# Patient Record
Sex: Female | Born: 1950 | Race: Black or African American | Hispanic: No | Marital: Single | Smoking: Never smoker
Health system: Southern US, Community
[De-identification: ages and names within clinical notes are randomized; demographics above are authoritative.]

## PROBLEM LIST (undated history)

## (undated) DIAGNOSIS — E66813 Obesity, class 3: Secondary | ICD-10-CM

## (undated) DIAGNOSIS — F319 Bipolar disorder, unspecified: Secondary | ICD-10-CM

## (undated) DIAGNOSIS — L0390 Cellulitis, unspecified: Secondary | ICD-10-CM

## (undated) DIAGNOSIS — G4733 Obstructive sleep apnea (adult) (pediatric): Secondary | ICD-10-CM

## (undated) DIAGNOSIS — I890 Lymphedema, not elsewhere classified: Secondary | ICD-10-CM

## (undated) DIAGNOSIS — R42 Dizziness and giddiness: Secondary | ICD-10-CM

## (undated) DIAGNOSIS — K219 Gastro-esophageal reflux disease without esophagitis: Secondary | ICD-10-CM

## (undated) DIAGNOSIS — J449 Chronic obstructive pulmonary disease, unspecified: Secondary | ICD-10-CM

## (undated) DIAGNOSIS — R0602 Shortness of breath: Secondary | ICD-10-CM

## (undated) DIAGNOSIS — I219 Acute myocardial infarction, unspecified: Secondary | ICD-10-CM

## (undated) DIAGNOSIS — J9610 Chronic respiratory failure, unspecified whether with hypoxia or hypercapnia: Secondary | ICD-10-CM

## (undated) DIAGNOSIS — Z9989 Dependence on other enabling machines and devices: Secondary | ICD-10-CM

## (undated) DIAGNOSIS — I10 Essential (primary) hypertension: Secondary | ICD-10-CM

## (undated) DIAGNOSIS — I2510 Atherosclerotic heart disease of native coronary artery without angina pectoris: Secondary | ICD-10-CM

## (undated) DIAGNOSIS — I739 Peripheral vascular disease, unspecified: Secondary | ICD-10-CM

## (undated) HISTORY — PX: APPENDECTOMY: SHX54

## (undated) HISTORY — PX: CHOLECYSTECTOMY: SHX55

## (undated) HISTORY — PX: ABDOMINAL HYSTERECTOMY: SHX81

## (undated) HISTORY — PX: TRACHEOSTOMY: SUR1362

## (undated) HISTORY — PX: TUBAL LIGATION: SHX77

---

## 1999-10-05 ENCOUNTER — Other Ambulatory Visit: Admission: RE | Admit: 1999-10-05 | Discharge: 1999-10-05 | Payer: Self-pay | Admitting: Family Medicine

## 1999-12-20 ENCOUNTER — Ambulatory Visit (HOSPITAL_COMMUNITY): Admission: RE | Admit: 1999-12-20 | Discharge: 1999-12-20 | Payer: Self-pay | Admitting: Gastroenterology

## 2001-10-12 ENCOUNTER — Emergency Department (HOSPITAL_COMMUNITY): Admission: EM | Admit: 2001-10-12 | Discharge: 2001-10-12 | Payer: Self-pay | Admitting: Emergency Medicine

## 2001-12-07 ENCOUNTER — Emergency Department (HOSPITAL_COMMUNITY): Admission: EM | Admit: 2001-12-07 | Discharge: 2001-12-07 | Payer: Self-pay | Admitting: Emergency Medicine

## 2001-12-27 ENCOUNTER — Emergency Department (HOSPITAL_COMMUNITY): Admission: EM | Admit: 2001-12-27 | Discharge: 2001-12-27 | Payer: Self-pay | Admitting: Emergency Medicine

## 2002-02-16 ENCOUNTER — Emergency Department (HOSPITAL_COMMUNITY): Admission: EM | Admit: 2002-02-16 | Discharge: 2002-02-16 | Payer: Self-pay | Admitting: Emergency Medicine

## 2002-03-12 ENCOUNTER — Encounter: Payer: Self-pay | Admitting: Internal Medicine

## 2002-03-12 ENCOUNTER — Encounter (HOSPITAL_COMMUNITY): Admission: RE | Admit: 2002-03-12 | Discharge: 2002-04-11 | Payer: Self-pay | Admitting: Internal Medicine

## 2002-04-10 ENCOUNTER — Ambulatory Visit (HOSPITAL_COMMUNITY): Admission: RE | Admit: 2002-04-10 | Discharge: 2002-04-10 | Payer: Self-pay | Admitting: General Surgery

## 2002-09-06 ENCOUNTER — Emergency Department (HOSPITAL_COMMUNITY): Admission: EM | Admit: 2002-09-06 | Discharge: 2002-09-06 | Payer: Self-pay | Admitting: Emergency Medicine

## 2002-09-06 ENCOUNTER — Encounter: Payer: Self-pay | Admitting: Emergency Medicine

## 2002-09-20 ENCOUNTER — Encounter: Payer: Self-pay | Admitting: Emergency Medicine

## 2002-09-20 ENCOUNTER — Emergency Department (HOSPITAL_COMMUNITY): Admission: EM | Admit: 2002-09-20 | Discharge: 2002-09-20 | Payer: Self-pay | Admitting: Emergency Medicine

## 2002-11-02 ENCOUNTER — Encounter: Payer: Self-pay | Admitting: Emergency Medicine

## 2002-11-02 ENCOUNTER — Emergency Department (HOSPITAL_COMMUNITY): Admission: EM | Admit: 2002-11-02 | Discharge: 2002-11-02 | Payer: Self-pay | Admitting: Emergency Medicine

## 2002-12-16 ENCOUNTER — Emergency Department (HOSPITAL_COMMUNITY): Admission: EM | Admit: 2002-12-16 | Discharge: 2002-12-16 | Payer: Self-pay | Admitting: Emergency Medicine

## 2003-02-06 ENCOUNTER — Ambulatory Visit (HOSPITAL_COMMUNITY): Admission: RE | Admit: 2003-02-06 | Discharge: 2003-02-06 | Payer: Self-pay | Admitting: General Surgery

## 2003-02-06 ENCOUNTER — Encounter: Payer: Self-pay | Admitting: General Surgery

## 2003-02-27 ENCOUNTER — Inpatient Hospital Stay (HOSPITAL_COMMUNITY): Admission: AD | Admit: 2003-02-27 | Discharge: 2003-03-11 | Payer: Self-pay | Admitting: General Surgery

## 2003-02-27 ENCOUNTER — Encounter: Payer: Self-pay | Admitting: General Surgery

## 2003-03-30 ENCOUNTER — Emergency Department (HOSPITAL_COMMUNITY): Admission: EM | Admit: 2003-03-30 | Discharge: 2003-03-30 | Payer: Self-pay | Admitting: Emergency Medicine

## 2003-05-31 ENCOUNTER — Emergency Department (HOSPITAL_COMMUNITY): Admission: EM | Admit: 2003-05-31 | Discharge: 2003-05-31 | Payer: Self-pay | Admitting: Emergency Medicine

## 2003-08-27 ENCOUNTER — Inpatient Hospital Stay (HOSPITAL_COMMUNITY): Admission: EM | Admit: 2003-08-27 | Discharge: 2003-09-08 | Payer: Self-pay | Admitting: Emergency Medicine

## 2003-09-11 ENCOUNTER — Emergency Department (HOSPITAL_COMMUNITY): Admission: EM | Admit: 2003-09-11 | Discharge: 2003-09-11 | Payer: Self-pay | Admitting: Emergency Medicine

## 2003-09-13 ENCOUNTER — Inpatient Hospital Stay (HOSPITAL_COMMUNITY): Admission: EM | Admit: 2003-09-13 | Discharge: 2003-10-01 | Payer: Self-pay | Admitting: Emergency Medicine

## 2003-10-23 ENCOUNTER — Encounter: Admission: RE | Admit: 2003-10-23 | Discharge: 2003-10-23 | Payer: Self-pay | Admitting: Unknown Physician Specialty

## 2003-10-26 ENCOUNTER — Inpatient Hospital Stay (HOSPITAL_COMMUNITY): Admission: EM | Admit: 2003-10-26 | Discharge: 2003-10-28 | Payer: Self-pay | Admitting: Emergency Medicine

## 2003-11-10 ENCOUNTER — Emergency Department (HOSPITAL_COMMUNITY): Admission: EM | Admit: 2003-11-10 | Discharge: 2003-11-11 | Payer: Self-pay | Admitting: Emergency Medicine

## 2003-11-14 ENCOUNTER — Other Ambulatory Visit: Admission: RE | Admit: 2003-11-14 | Discharge: 2003-11-14 | Payer: Self-pay | Admitting: Obstetrics and Gynecology

## 2003-11-14 ENCOUNTER — Ambulatory Visit (HOSPITAL_COMMUNITY): Admission: RE | Admit: 2003-11-14 | Discharge: 2003-11-14 | Payer: Self-pay | Admitting: Unknown Physician Specialty

## 2003-11-17 ENCOUNTER — Inpatient Hospital Stay (HOSPITAL_COMMUNITY): Admission: EM | Admit: 2003-11-17 | Discharge: 2003-11-28 | Payer: Self-pay | Admitting: Emergency Medicine

## 2003-11-26 ENCOUNTER — Encounter (INDEPENDENT_AMBULATORY_CARE_PROVIDER_SITE_OTHER): Payer: Self-pay | Admitting: *Deleted

## 2003-12-11 ENCOUNTER — Inpatient Hospital Stay (HOSPITAL_COMMUNITY): Admission: EM | Admit: 2003-12-11 | Discharge: 2003-12-17 | Payer: Self-pay | Admitting: Emergency Medicine

## 2004-02-18 ENCOUNTER — Inpatient Hospital Stay (HOSPITAL_COMMUNITY): Admission: EM | Admit: 2004-02-18 | Discharge: 2004-02-23 | Payer: Self-pay | Admitting: Emergency Medicine

## 2004-03-25 ENCOUNTER — Emergency Department (HOSPITAL_COMMUNITY): Admission: EM | Admit: 2004-03-25 | Discharge: 2004-03-25 | Payer: Self-pay | Admitting: Emergency Medicine

## 2004-05-10 ENCOUNTER — Emergency Department (HOSPITAL_COMMUNITY): Admission: EM | Admit: 2004-05-10 | Discharge: 2004-05-10 | Payer: Self-pay

## 2004-09-03 IMAGING — CR DG CHEST 1V PORT
1 series · 1 of 1 positions shown · non-contrast
Comparison: none

CLINICAL DATA: Chest pain.
 CHEST PORTABLE ONE VIEW
 Comparison 02/27/03.  
 Port-A-Cath is in place with the tip overlying the superior vena cava.  There is chronic elevation of the right hemidiaphragm.  Heart size and vascularity are within normal limits.  The lungs are clear.  
 IMPRESSION
 No acute abnormality.

[view not recorded]
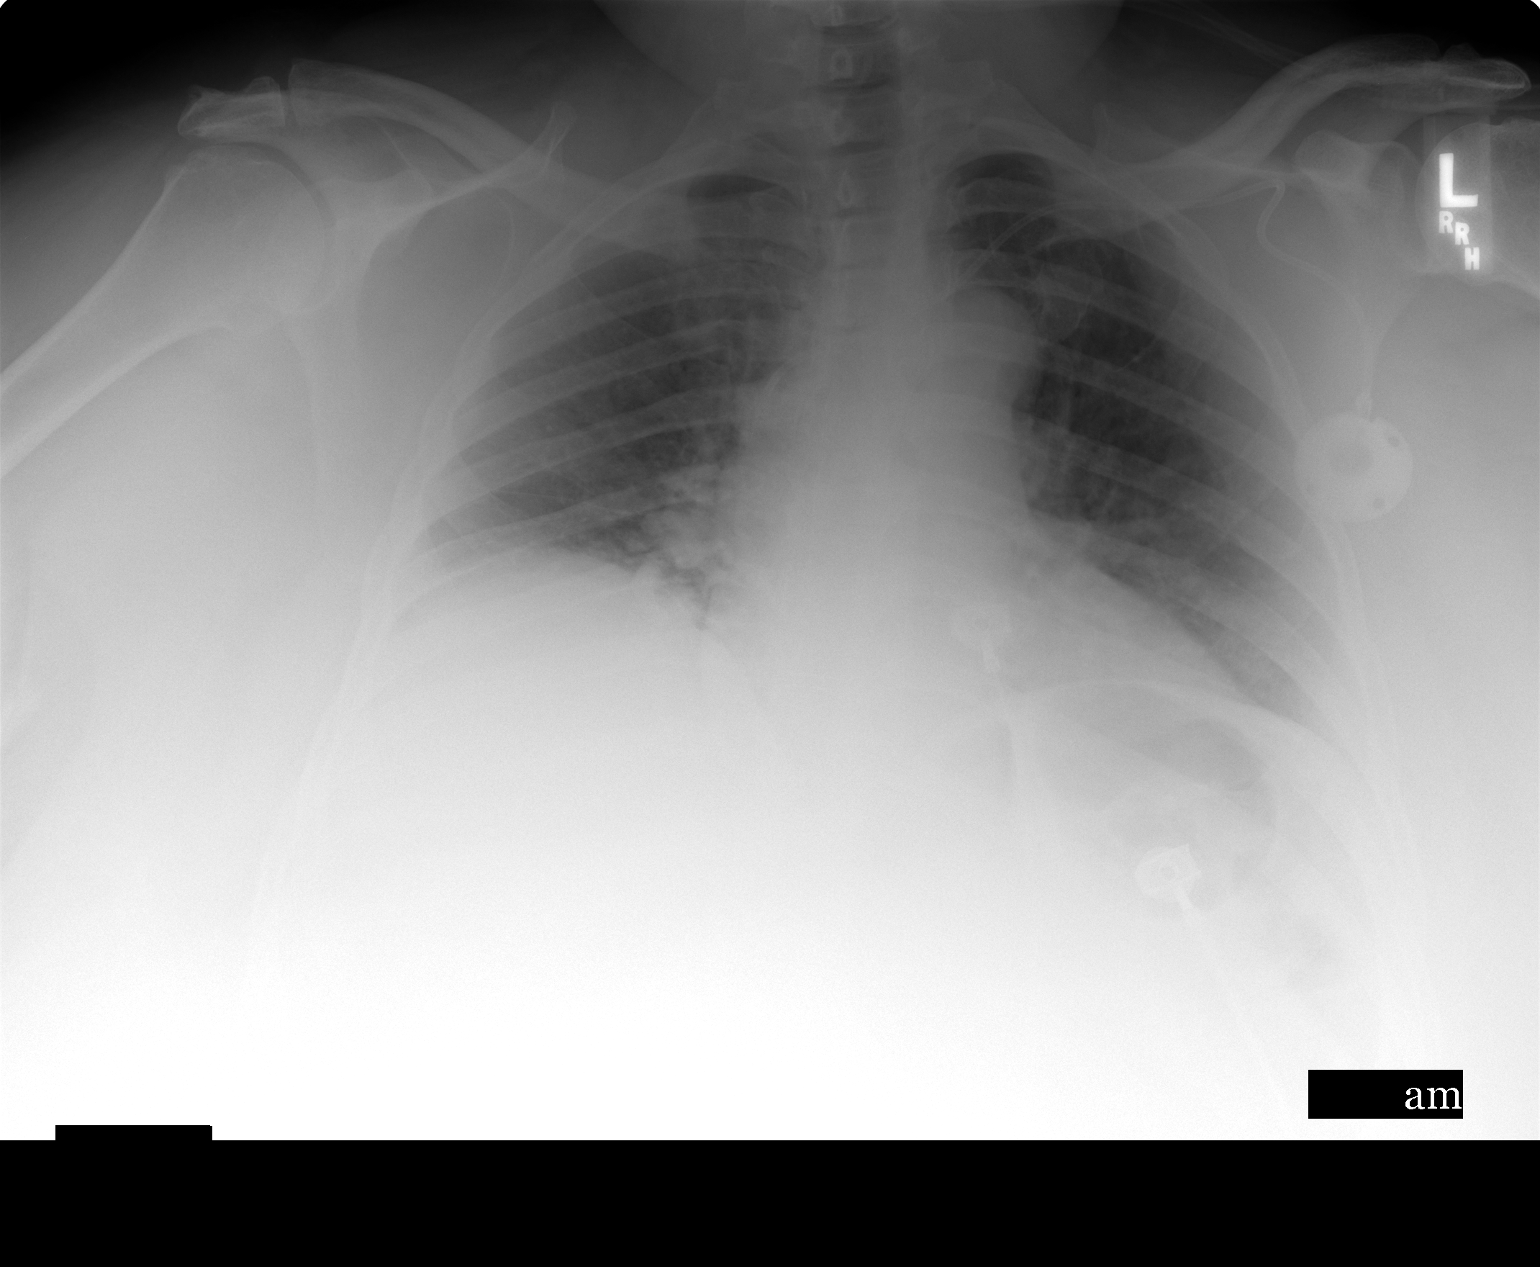

[1 of 1 positions shown; findings below may reference images not displayed]

## 2004-09-05 IMAGING — CR DG CHEST 1V PORT
1 series · 1 of 1 positions shown · non-contrast
Comparison: none

CLINICAL DATA: Port-A-Cath insertion, sepsis.  
 PORTABLE CHEST ONE VIEW
 Portable exam at 9901 hours compared to 08/27/03.  Left subclavian Port-A-Cath replacement, tip, right atrium.  No pneumothorax.  Bibasilar atelectasis.  Cardiomegaly.  Upper lungs clear, though slight vascular congestion is noted.  
 IMPRESSION
 Cardiomegaly with vascular congestion.  Bibasilar atelectasis.  No pneumothorax following Port-A-Cath insertion.

[view not recorded]
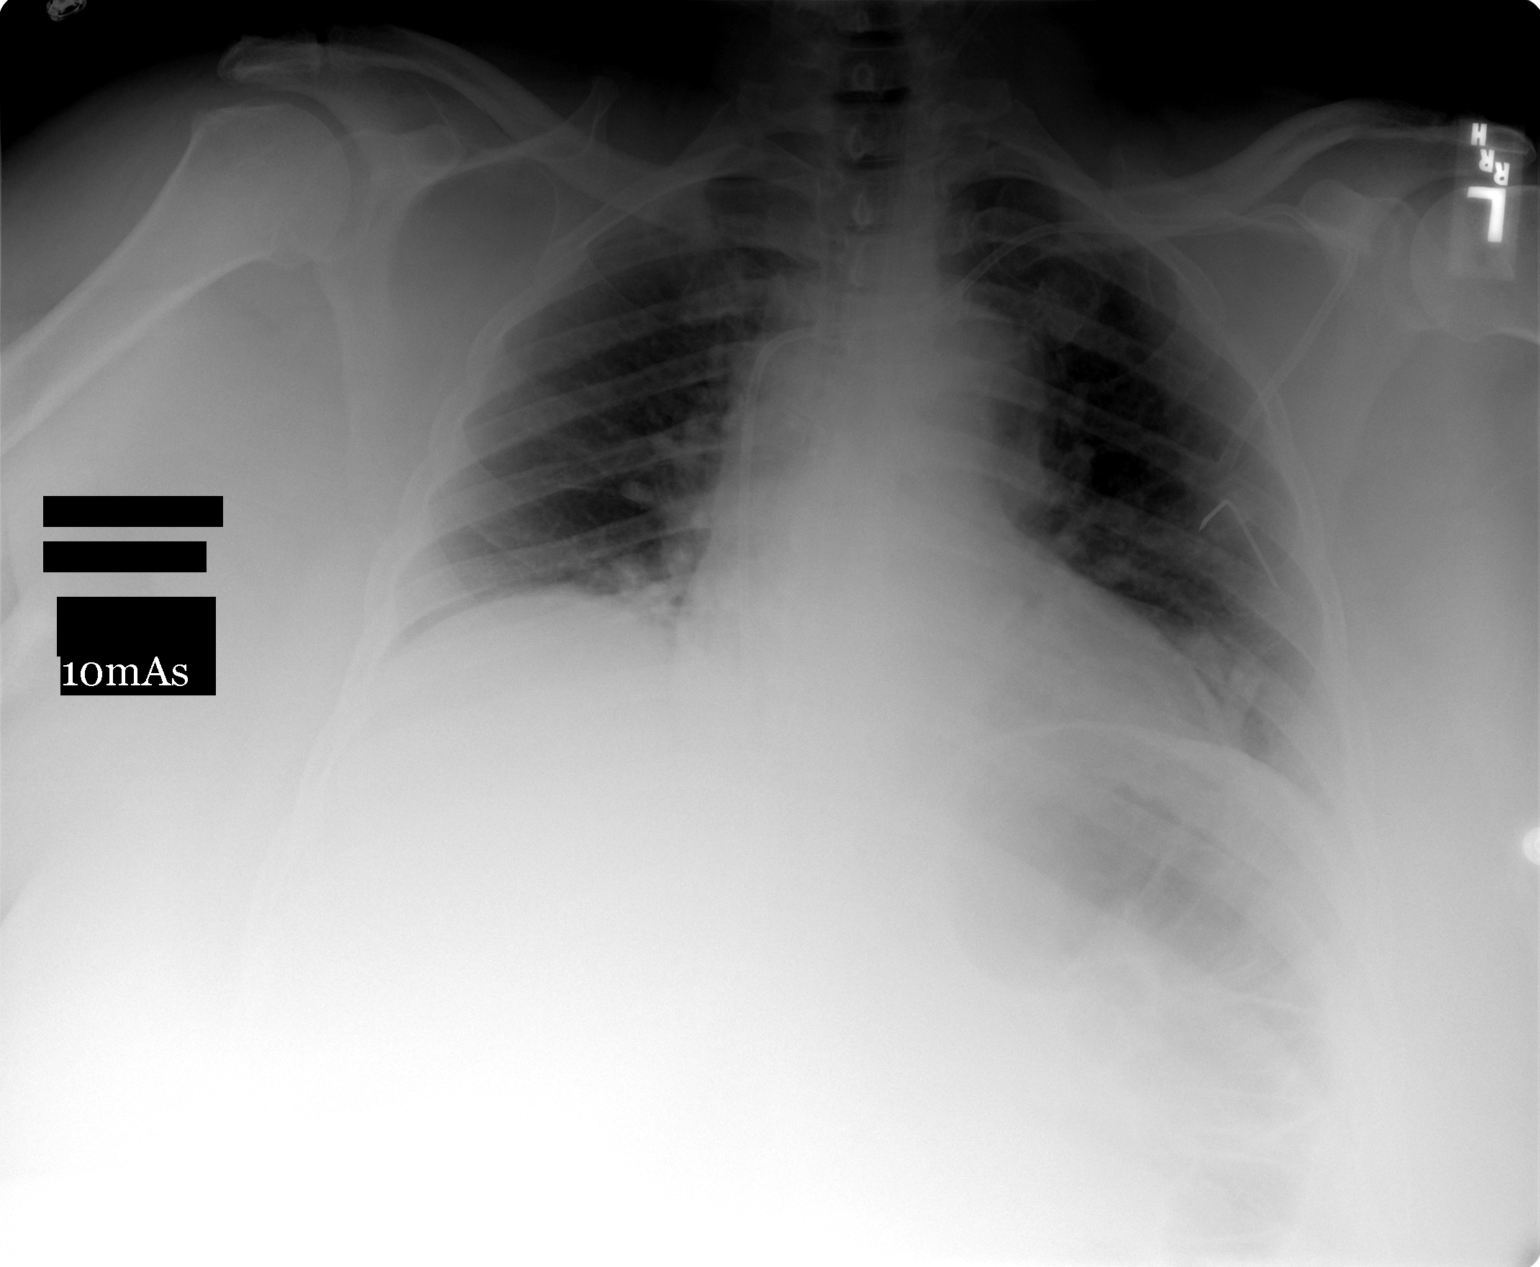

[1 of 1 positions shown; findings below may reference images not displayed]

## 2004-09-06 IMAGING — CR DG TIBIA/FIBULA PORT 2V*L*
2 series · 2 of 2 positions shown · non-contrast
Comparison: none

CLINICAL DATA: Lower extremity swelling; question of soft tissue gas
 LEFT TIBIA AND FIBULA
 Two views are performed of the left tibia and fibula, showing significant soft tissue edema.  The contours of the tibia and fibula are somewhat wavy, raising the question of prior trauma.  No acute fracture is seen.  No definite soft tissue gas is identified.  Degenerative changes are seen in the ankle and knee.  
 IMPRESSION
 No definite soft tissue gas or fracture.

[view not recorded (1 of 2)]
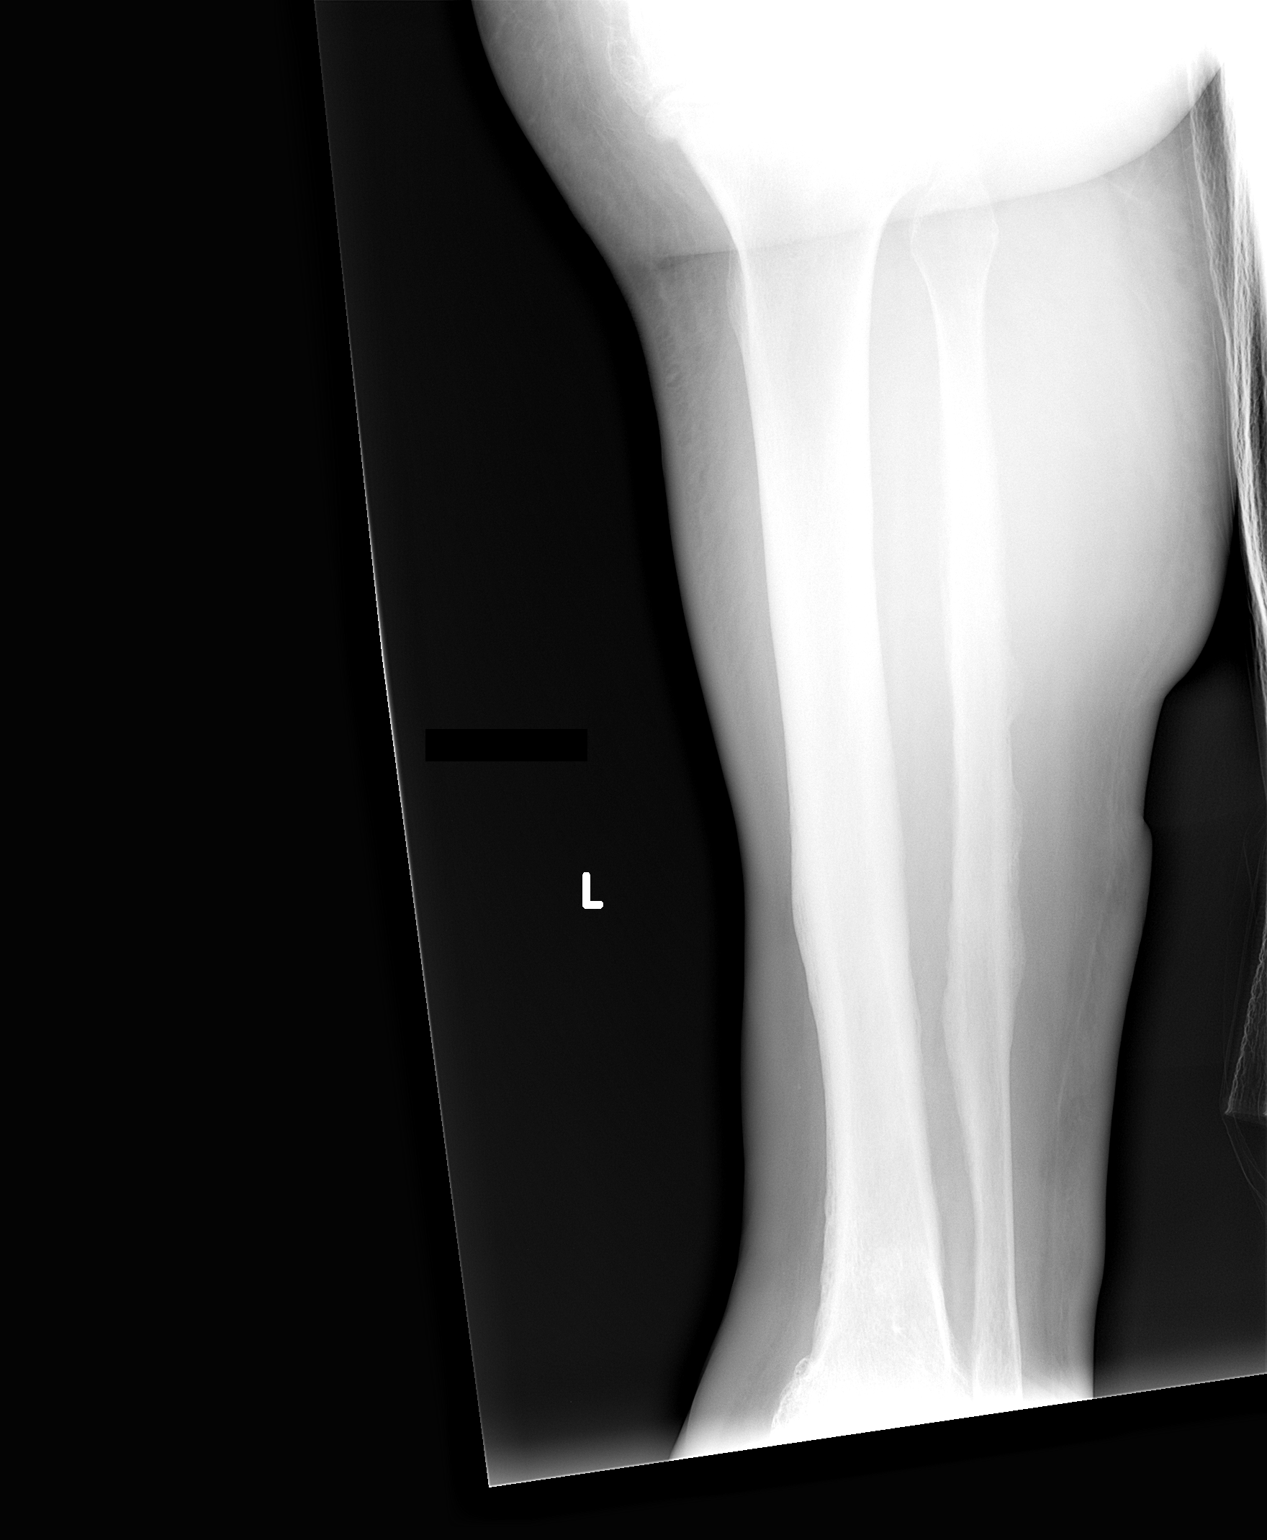

[view not recorded (2 of 2)]
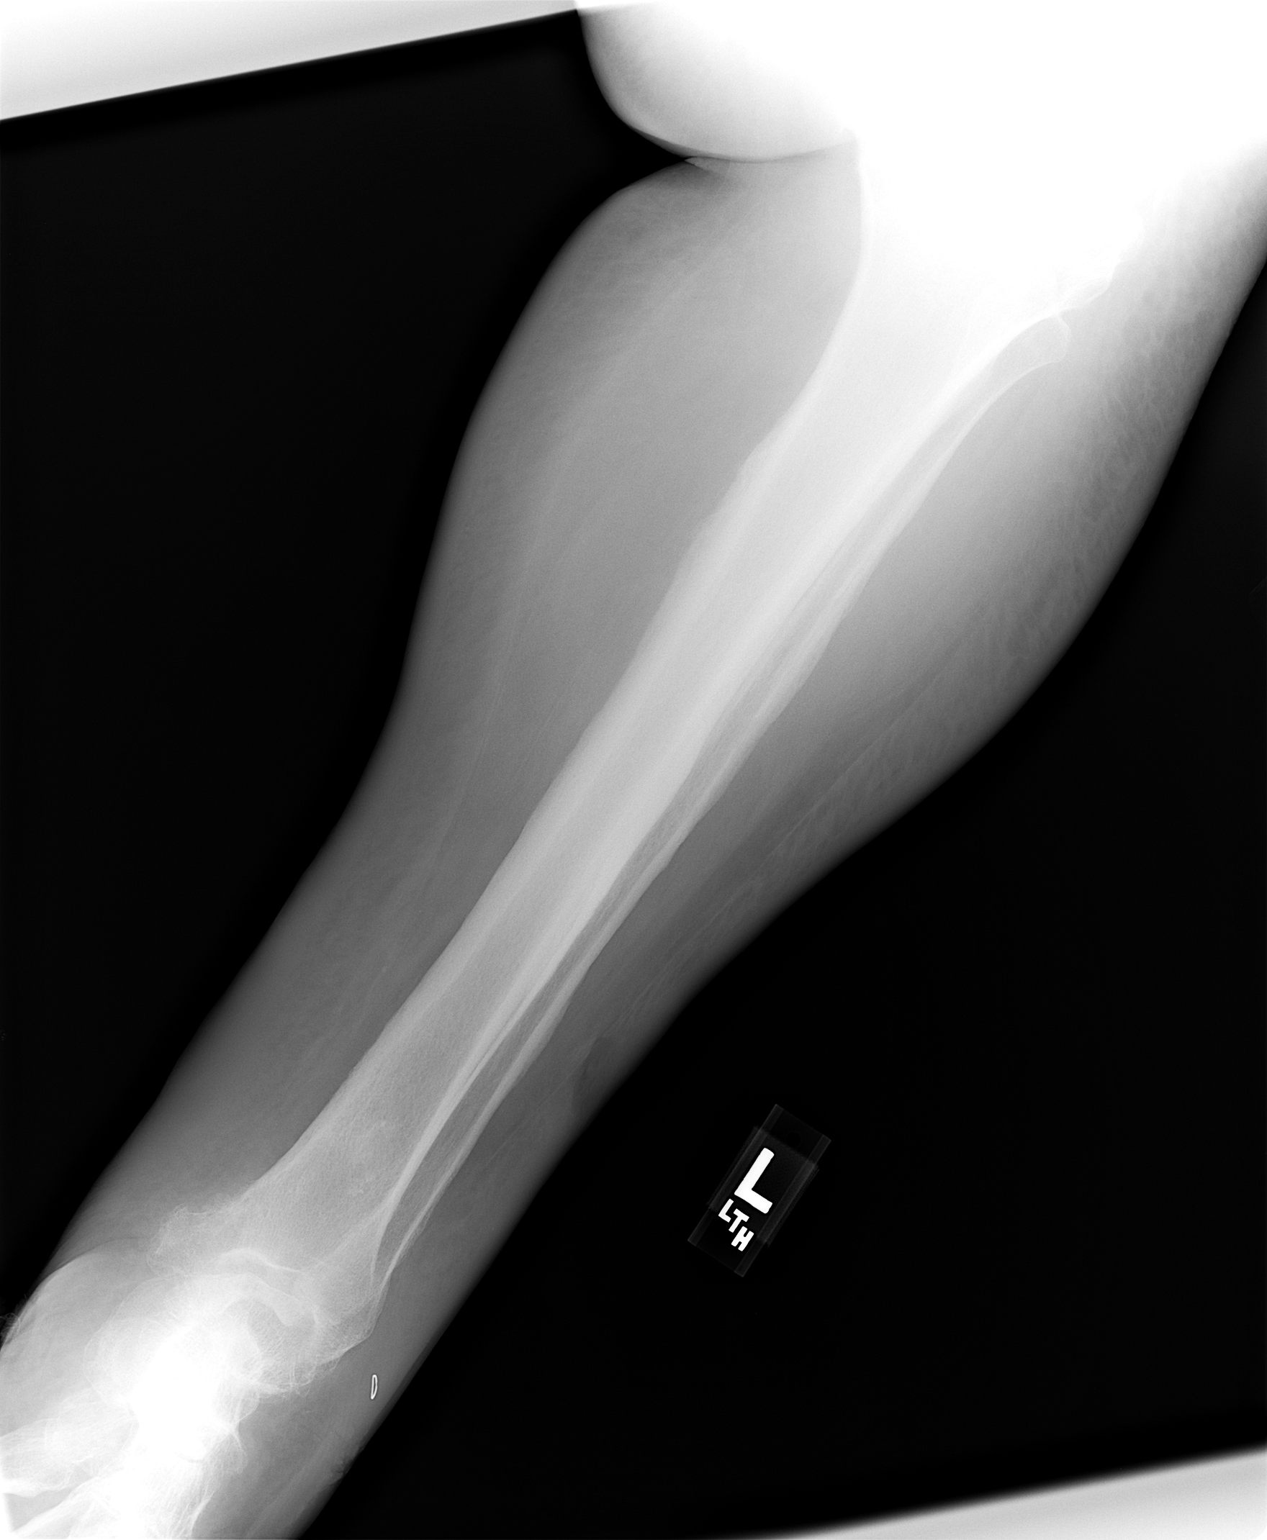

[2 of 2 positions shown; findings below may reference images not displayed]

## 2004-09-07 IMAGING — CT CT EXTREM LOW W/O CM*L*
1 series · 12 of 14 positions shown, 15 images · non-contrast
Comparison: none

CLINICAL DATA: Left leg soft tissue swelling and cellulitis.  Evaluate for soft tissue abscess.
CT OF THE LEFT LOWER EXTREMITY WITHOUT CONTRAST
Multidetector helical CT was performed through the mid and lower left leg and ankle in the region of soft tissue swelling.  No intravenous contrast was administered due to lack of suitable intravenous access in this patient.  
Marked edema and skin thickening is seen throughout the superficial subcutaneous tissues of the left leg and ankle.  This is more severe along the medial and anterior aspects of the leg and ankle.  This study is limited by lack of intravenous contrast, but no definite focal low density fluid collection is seen to suggest the presence of a soft tissue abscess.  Chronic periosteal thickening is seen along the tibial and fibular shafts.  However, there is no evidence of acute bone destruction or abnormal lucency to suggest the presence of osteomyelitis.
IMPRESSION
1.  Diffuse edema of subcutaneous tissues and skin thickening throughout the left leg and ankle, consistent with cellulitis.  This study is limited by lack of intravenous contrast, but no definite abscess collection is identified by CT.  MRI would be more sensitive for soft tissue abscess and is recommended for further evaluation if clinically warranted.  
2.  No CT evidence of osteomyelitis.  MRI would also be more sensitive in detection of osteomyelitis.

[Series 6417: — · axial · 0.48mm/px · z∈[+1114,+1138]mm · 12 of 48 slices shown, 15 images]
[im 4/48  soft-tissue]
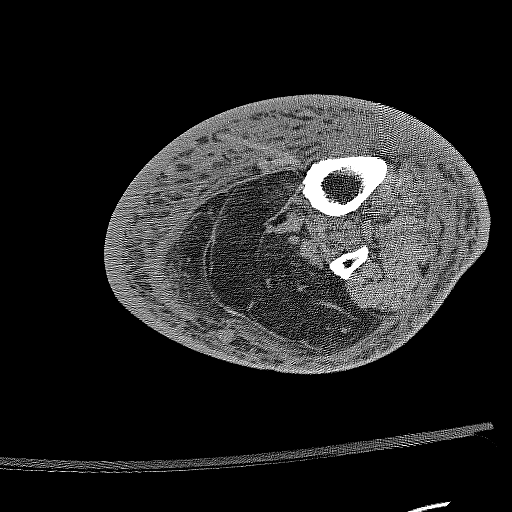
[im 4/48  bone]
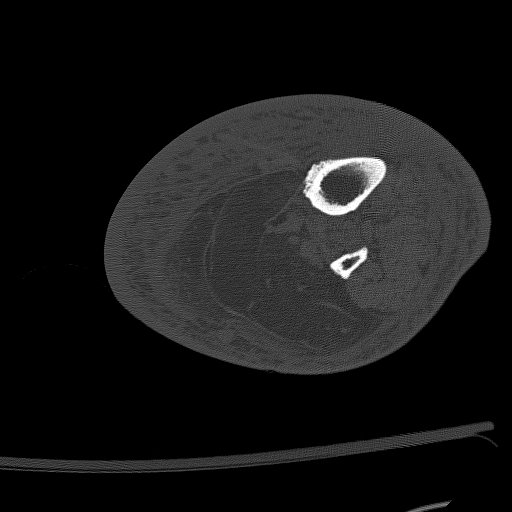
[im 8/48  bone]
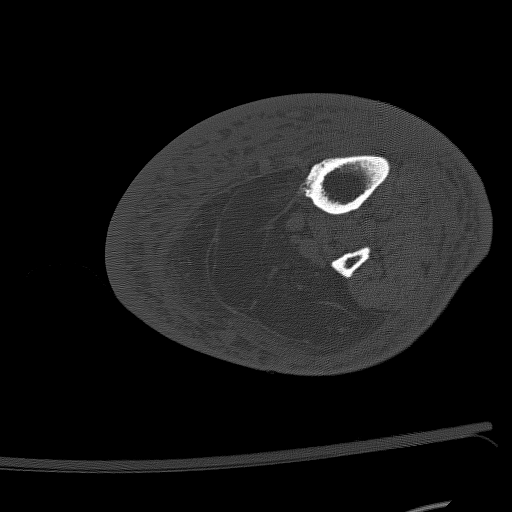
[im 11/48  bone]
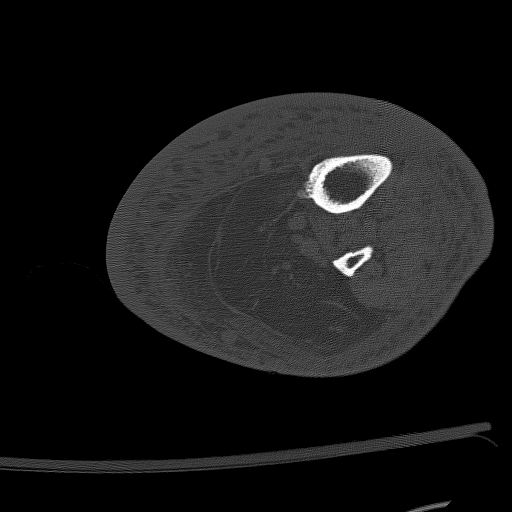
[im 15/48  bone]
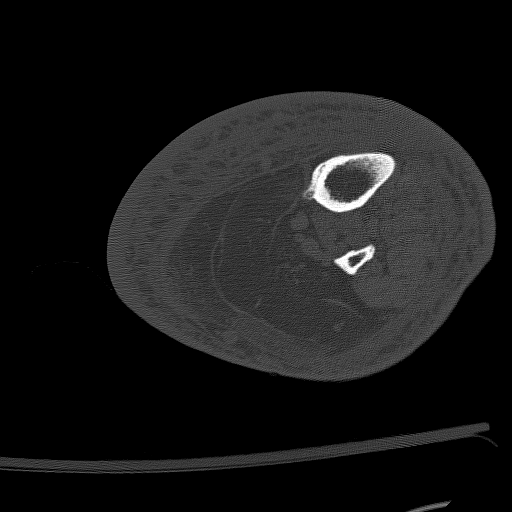
[im 19/48  soft-tissue]
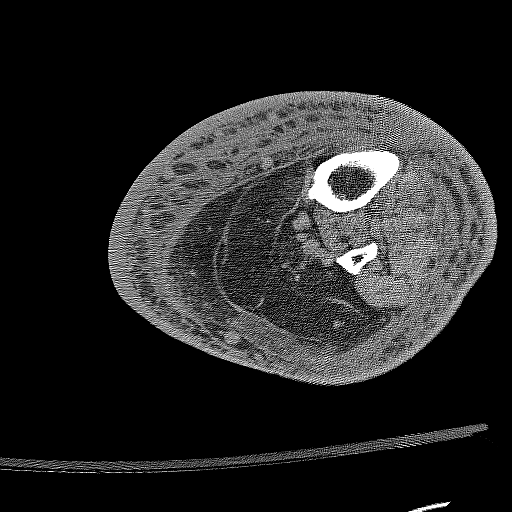
[im 19/48  bone]
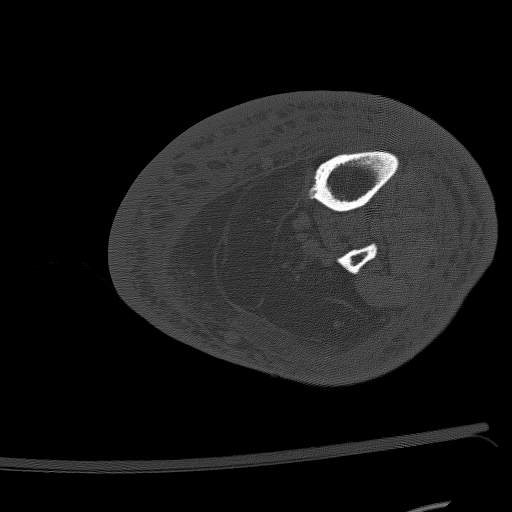
[im 22/48  bone]
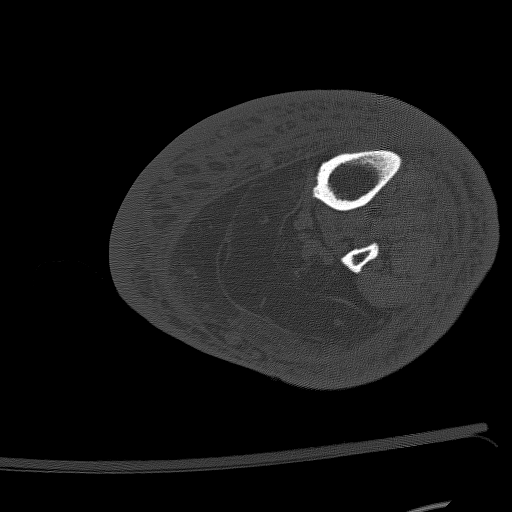
[im 26/48  bone]
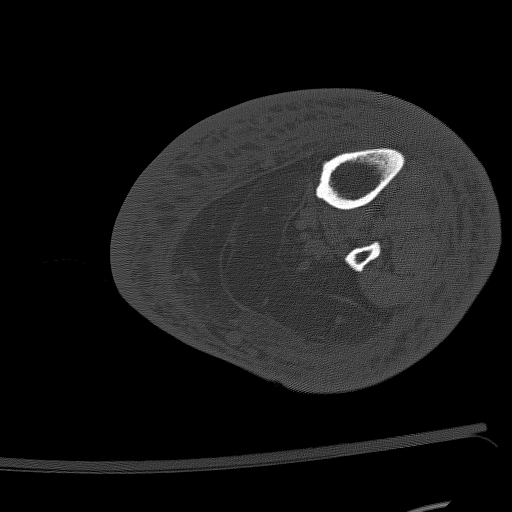
[im 29/48  bone]
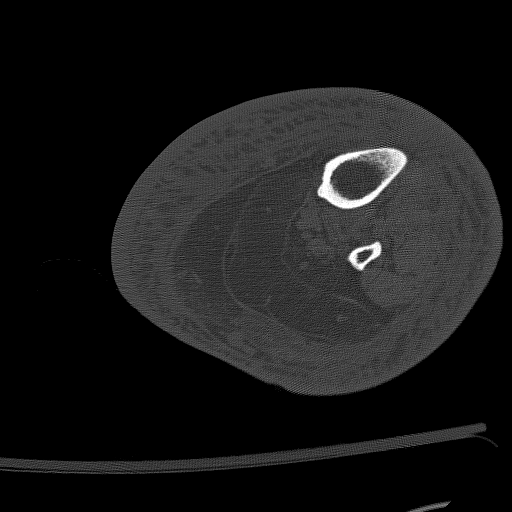
[im 33/48  soft-tissue]
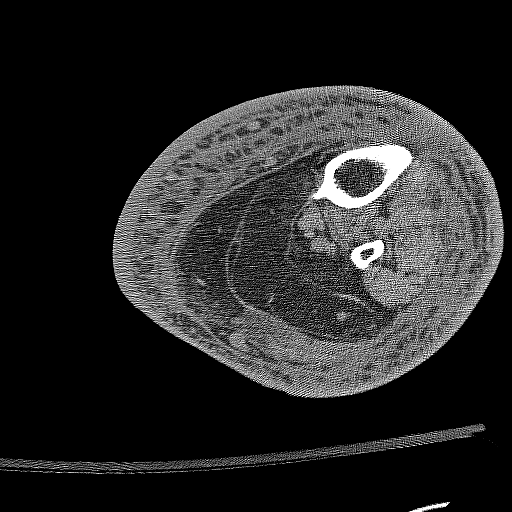
[im 33/48  bone]
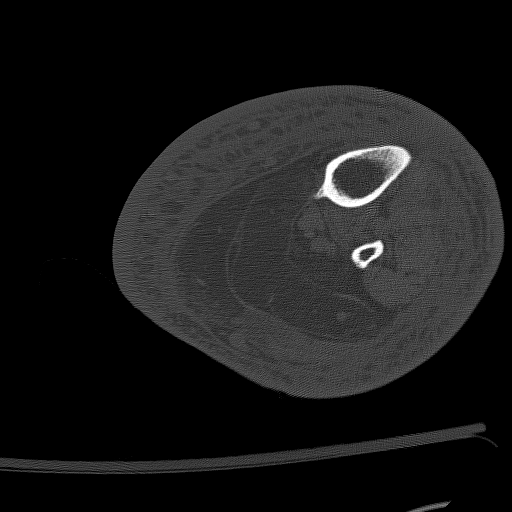
[im 37/48  bone]
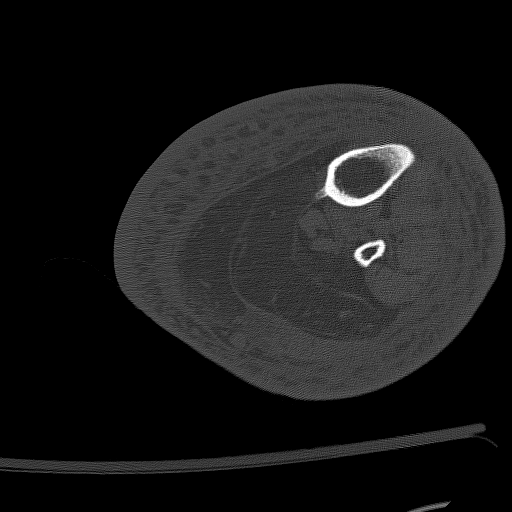
[im 40/48  bone]
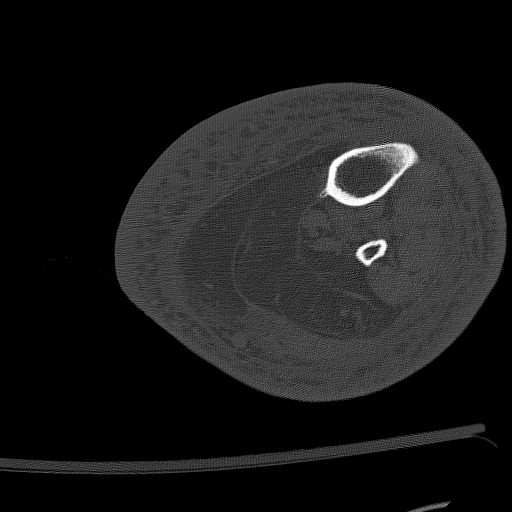
[im 44/48  bone]
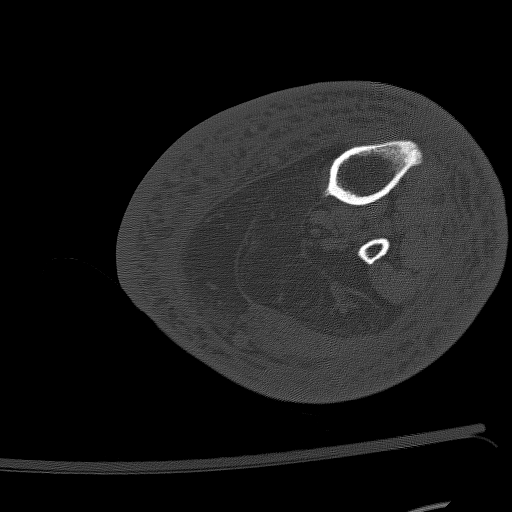

[12 of 14 positions shown; findings below may reference images not displayed]

## 2004-09-12 IMAGING — CR DG TIBIA/FIBULA 2V*L*
2 series · 2 of 2 positions shown · non-contrast
Comparison: none

CLINICAL DATA: Evaluate for new fracture.  
 LEFT FOOT FOUR VIEWS
 Evidence of old fracture with bony overgrowth proximal left fourth metatarsal shaft.  Present exam is somewhat limited in that standard imaging was not able to be performed.  Taking this limitation into account, an acute fracture is not identified.  Severe soft tissue swelling.  No plain film findings to suggest osteomyelitis.  Radiopaque structure along the dorsal aspect of the ankle.  If further evaluation of the foot were clinically desired, MR imaging may be considered.  
 IMPRESSION
 No evidence of acute fracture.  Please see above.
 LEFT TIBIA/FIBULA TWO VIEWS
 Limited exam in that standard views were not able to be obtained.  No acute fracture detected.  Diffuse bony changes appear chronic in origin.  Soft tissue swelling.  Please see recent MR report.  Acute osteomyelitis superimposed on chronic osteomyelitis cannot be excluded.  Enchondroma distal tibial metaphysis suspected.  Small focus of osteomyelitis cannot be absolutely excluded.  
 No acute fracture detected on this limited exam.

[view not recorded (1 of 2)]
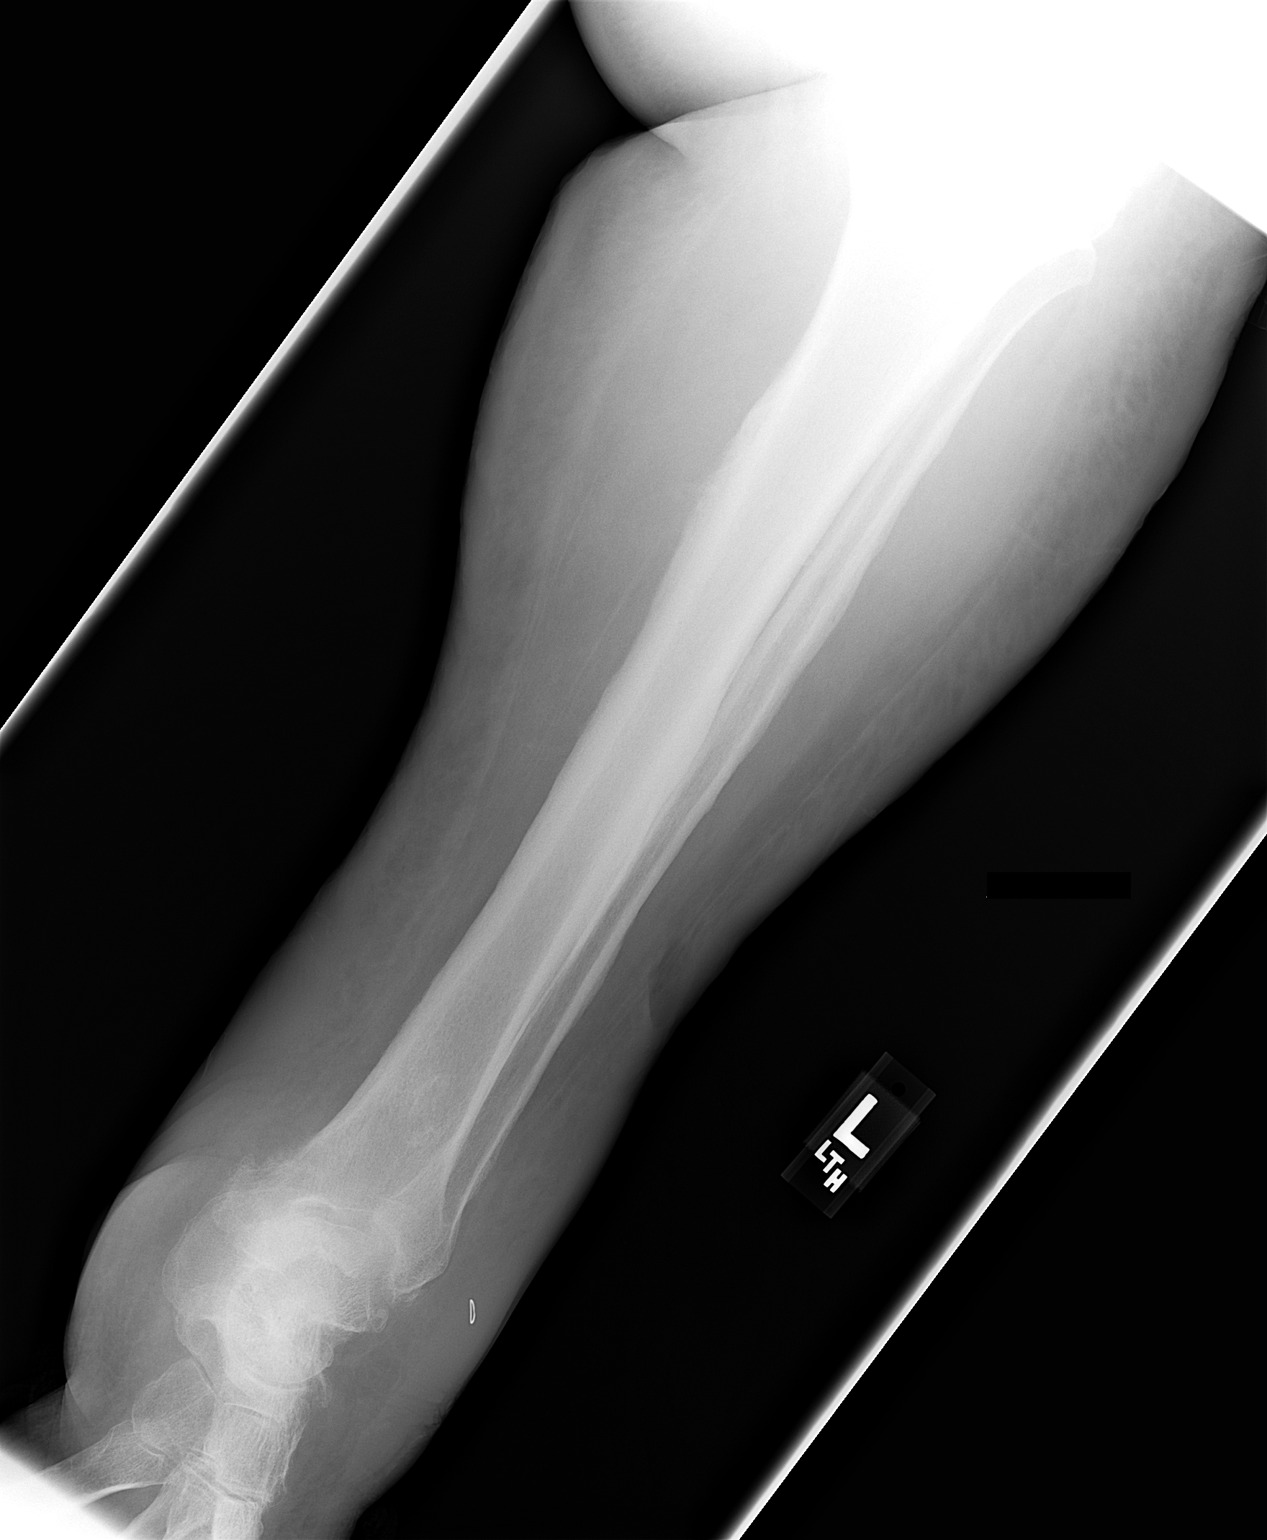

[view not recorded (2 of 2)]
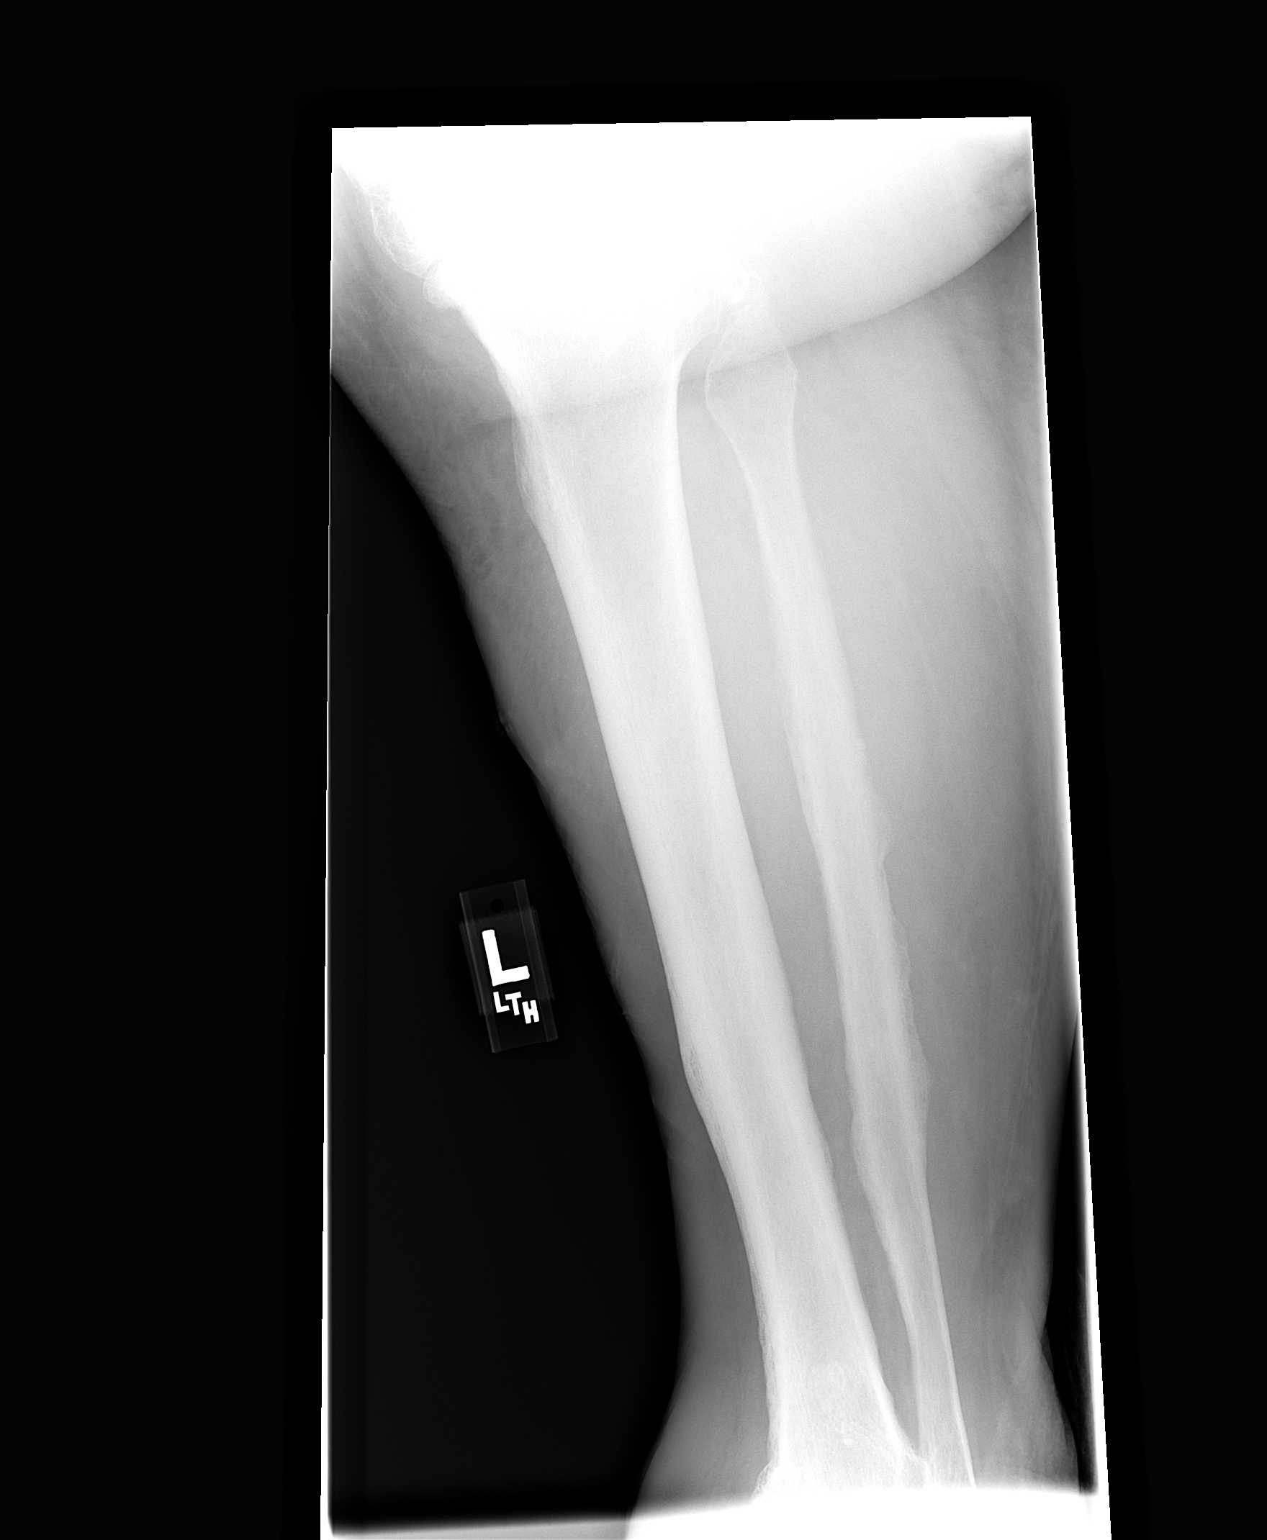

[2 of 2 positions shown; findings below may reference images not displayed]

## 2004-09-12 IMAGING — CR DG FOOT COMPLETE 3+V*L*
3 series · 3 of 3 positions shown · non-contrast
Comparison: none

CLINICAL DATA: Evaluate for new fracture.  
 LEFT FOOT FOUR VIEWS
 Evidence of old fracture with bony overgrowth proximal left fourth metatarsal shaft.  Present exam is somewhat limited in that standard imaging was not able to be performed.  Taking this limitation into account, an acute fracture is not identified.  Severe soft tissue swelling.  No plain film findings to suggest osteomyelitis.  Radiopaque structure along the dorsal aspect of the ankle.  If further evaluation of the foot were clinically desired, MR imaging may be considered.  
 IMPRESSION
 No evidence of acute fracture.  Please see above.
 LEFT TIBIA/FIBULA TWO VIEWS
 Limited exam in that standard views were not able to be obtained.  No acute fracture detected.  Diffuse bony changes appear chronic in origin.  Soft tissue swelling.  Please see recent MR report.  Acute osteomyelitis superimposed on chronic osteomyelitis cannot be excluded.  Enchondroma distal tibial metaphysis suspected.  Small focus of osteomyelitis cannot be absolutely excluded.  
 No acute fracture detected on this limited exam.

[view not recorded (1 of 3)]
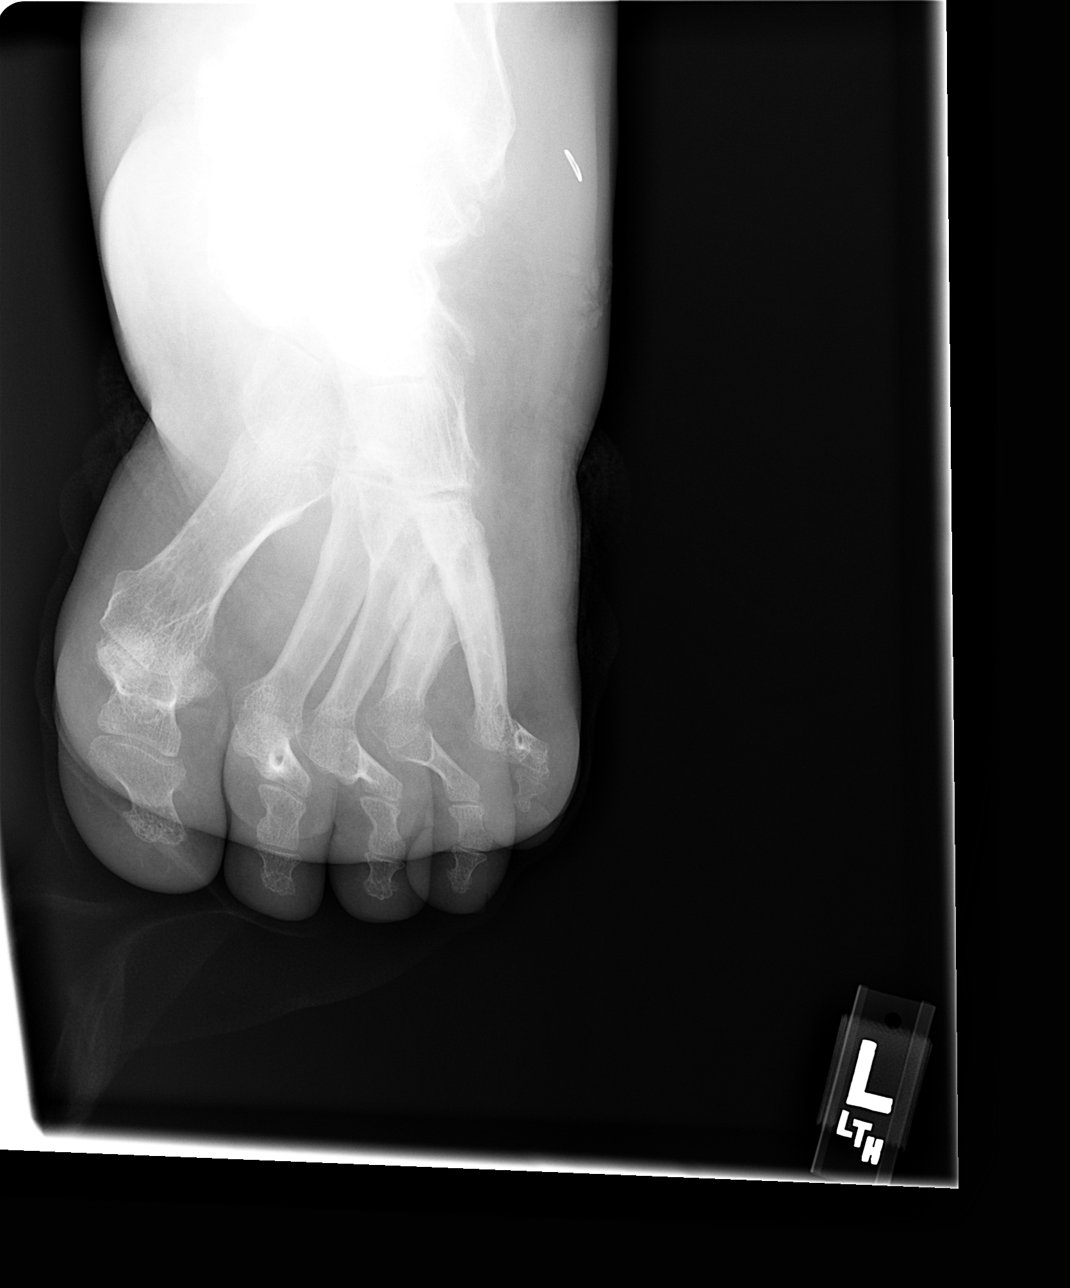

[view not recorded (2 of 3)]
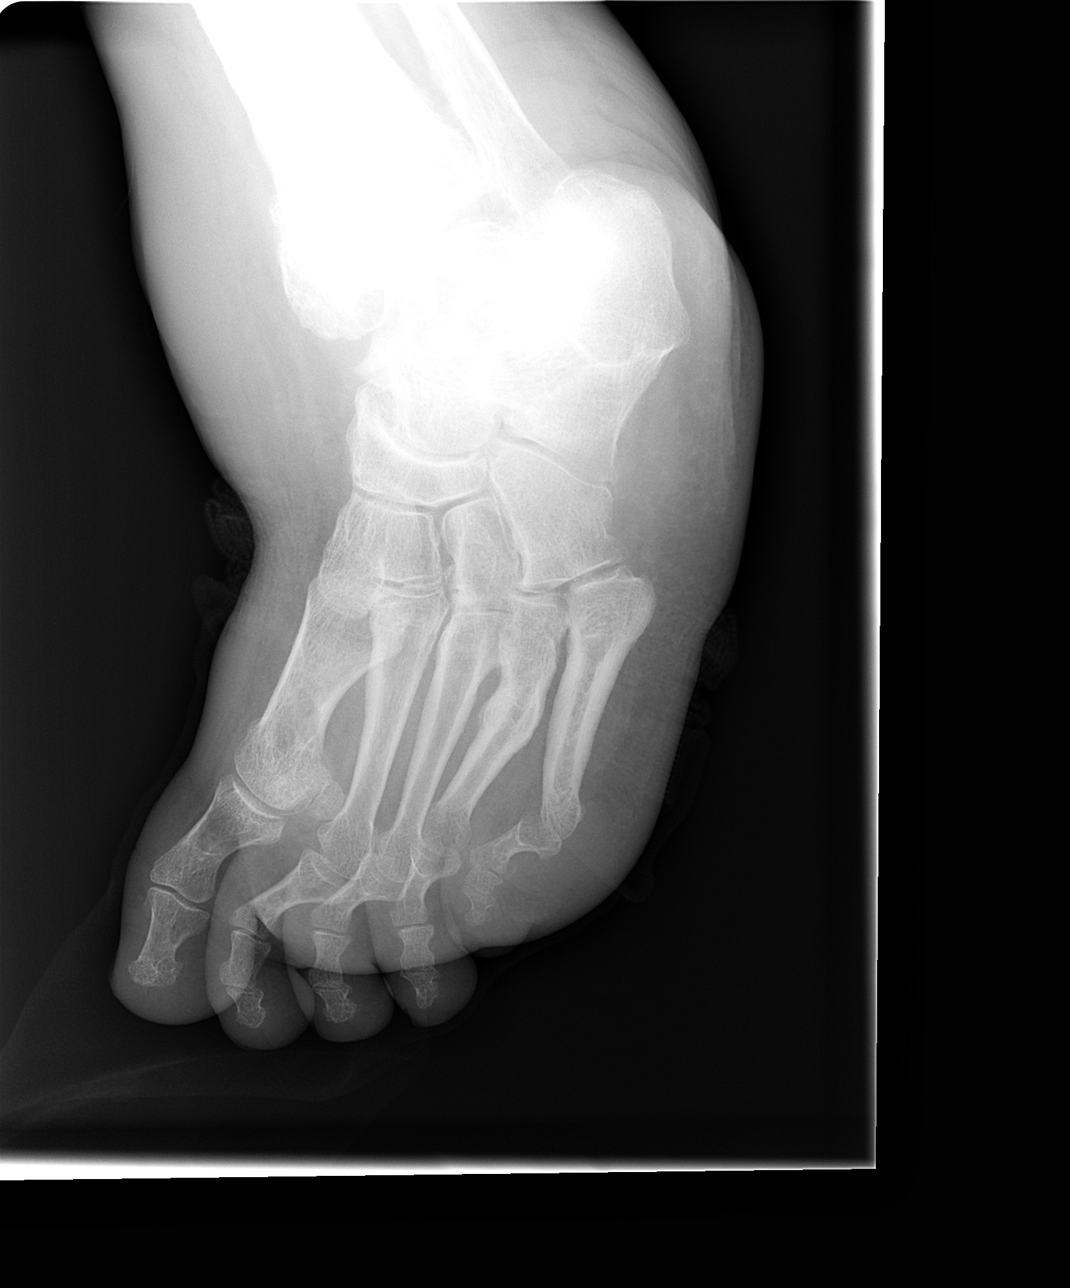

[view not recorded (3 of 3)]
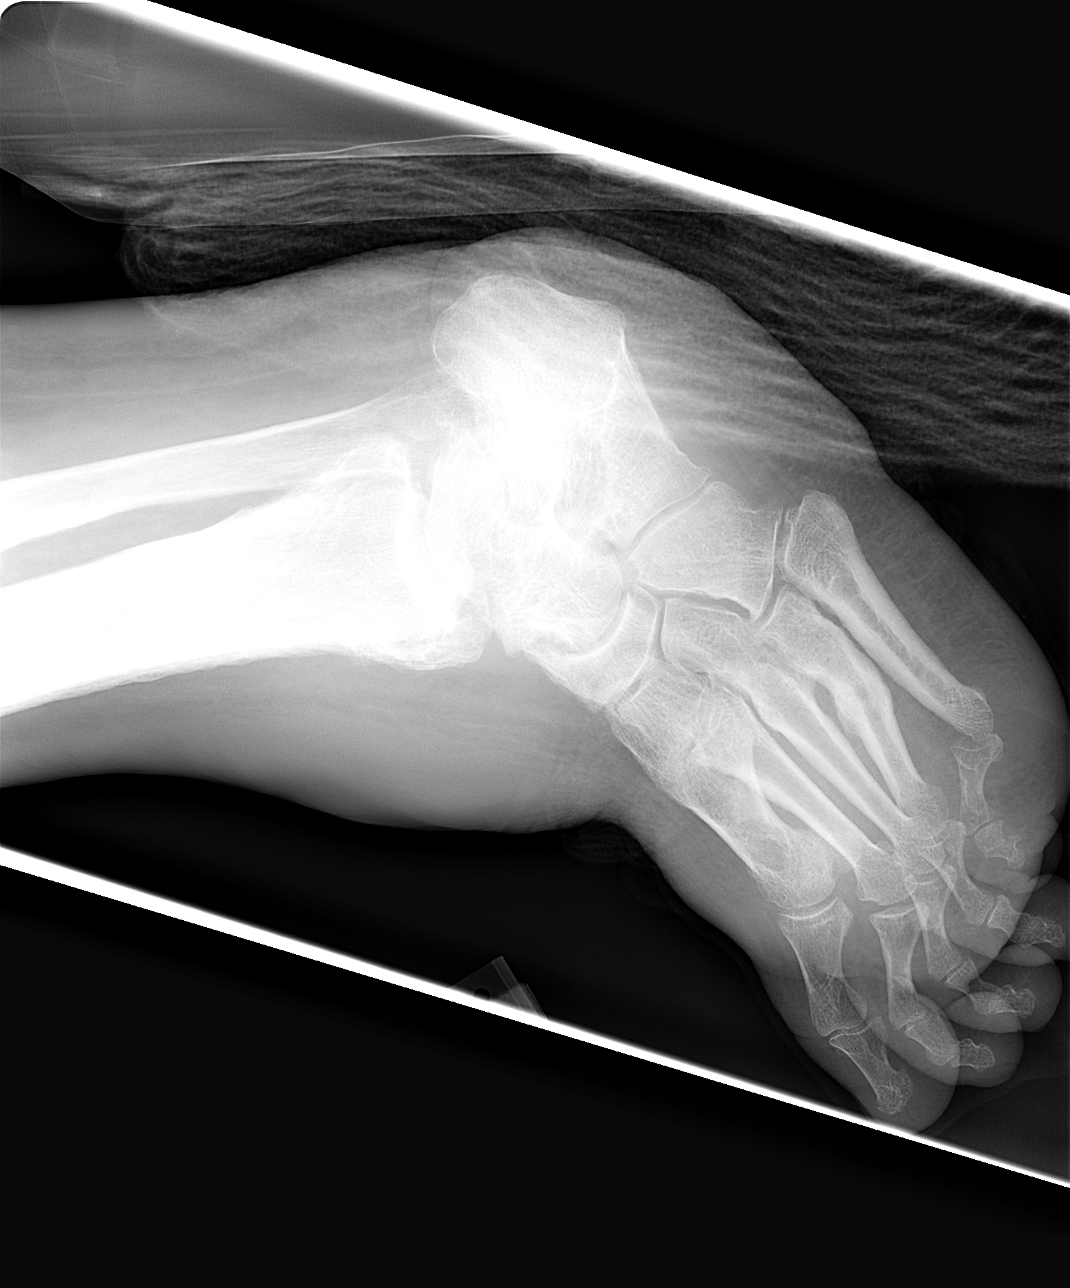

[3 of 3 positions shown; findings below may reference images not displayed]

## 2004-09-20 IMAGING — CR DG CHEST 1V PORT
1 series · 1 of 1 positions shown · non-contrast
Comparison: none

CLINICAL DATA: Shortness of breath and chest pain.  
 SINGLE VIEW PORTABLE CHEST
 2112 hours:  Comparison 08/29/03.
 Stable appearance of left subclavian Port-A-Cath.  Stable elevation of right hemidiaphragm.  There are low generalized lung volumes with no evidence of edema or focal infiltrate.  Heart size is normal.  No pleural effusions seen.
 IMPRESSION
 Low lung volumes with stable elevation of right hemidiaphragm.

[view not recorded]
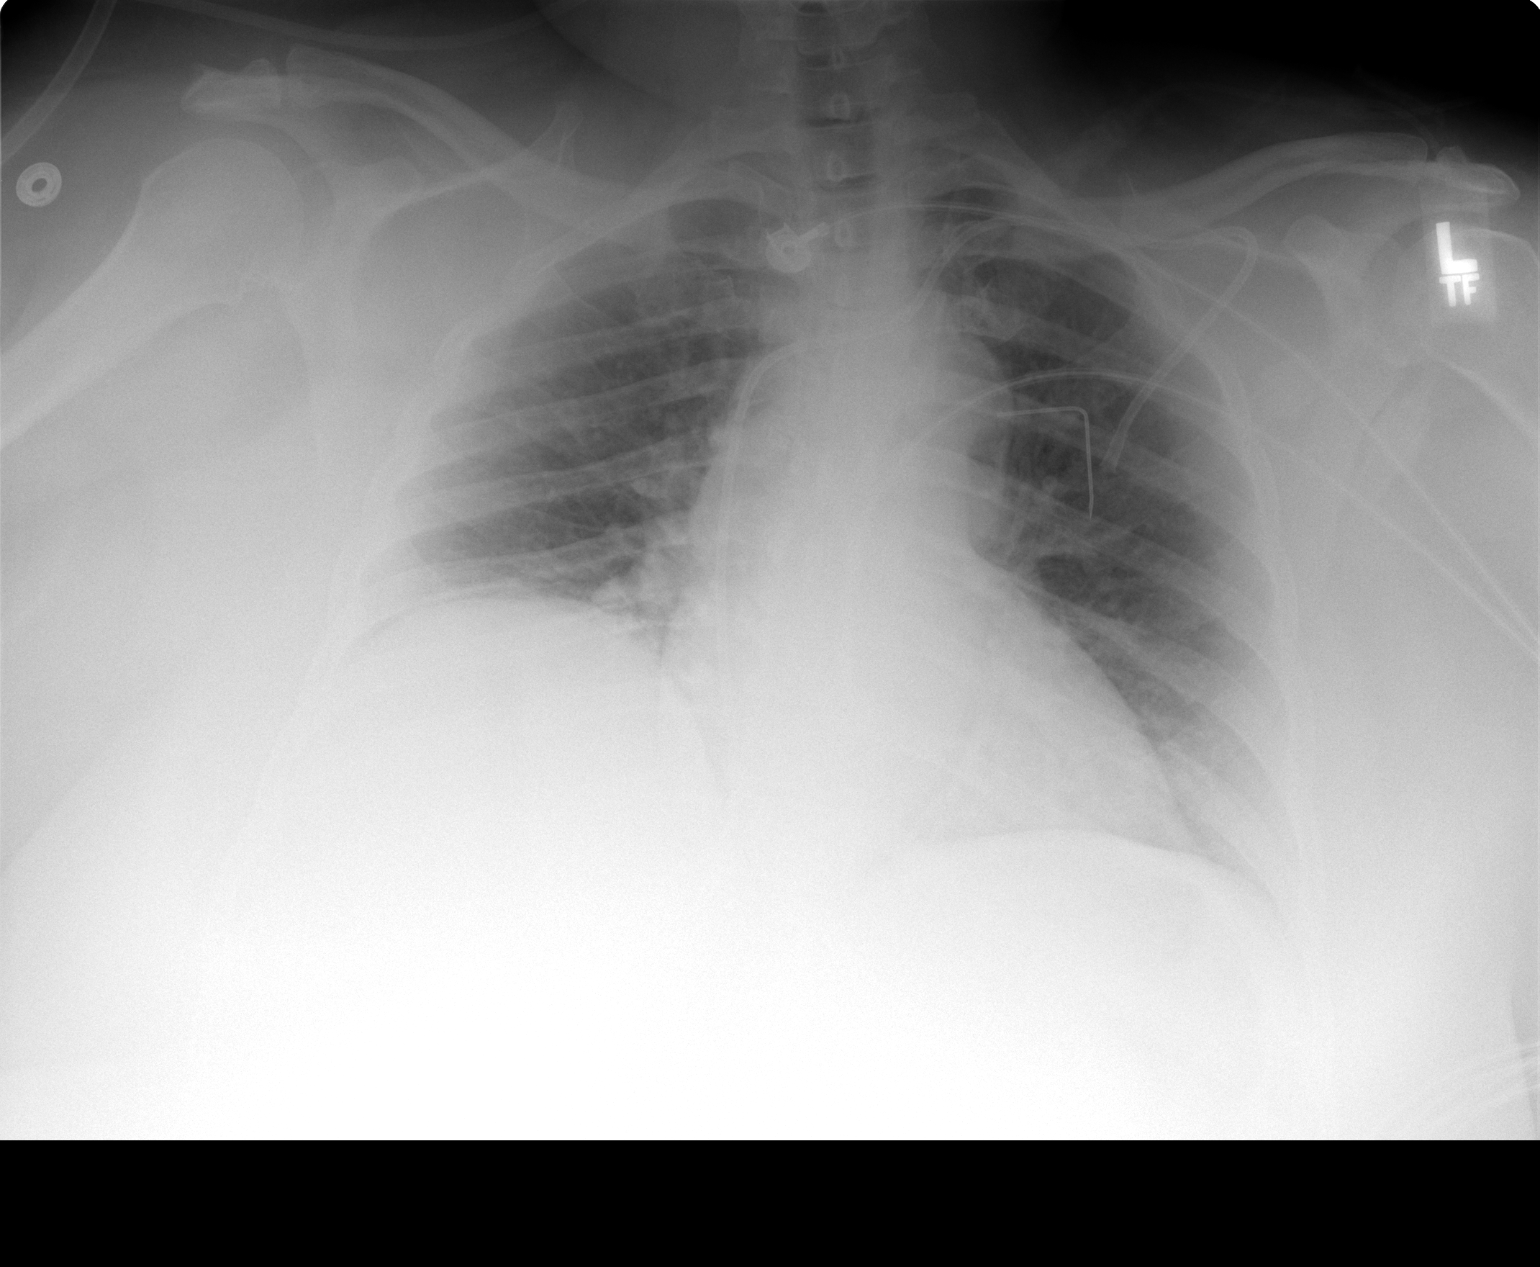

[1 of 1 positions shown; findings below may reference images not displayed]

## 2004-09-23 ENCOUNTER — Emergency Department (HOSPITAL_COMMUNITY): Admission: EM | Admit: 2004-09-23 | Discharge: 2004-09-23 | Payer: Self-pay | Admitting: Emergency Medicine

## 2004-09-29 IMAGING — US US RETROPERITONEAL COMPLETE
1 series · 14 of 25 positions shown · non-contrast
Comparison: none

CLINICAL DATA: 53-year-old with cellulitis; chest pain, diabetes, hypertension, anemia.  Obesity.
US RENAL
The right kidney is 12.3 cm in length.  The left kidney is 11.8 cm in length.  Renal parenchyma is echogenic.  No focal masses are seen, however.  No evidence for hydronephrosis.  Fluid is seen within the bladder.  Study is limited by patient body habitus.
IMPRESSION
1.  Bilateral echogenic kidneys without focal mass or hydronephrosis.
2. Study is slightly limited by patient body habitus.

[Series 1: unknown · 0.35mm/px · 14 of 42 slices shown]
[im 1/42]
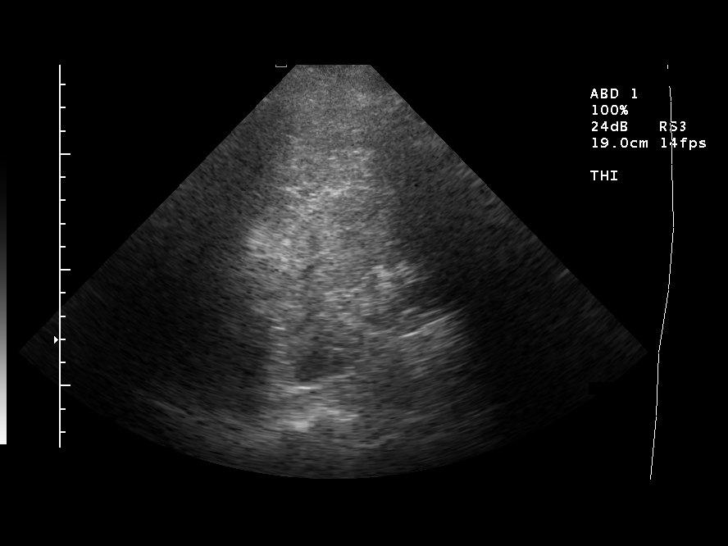
[im 4/42]
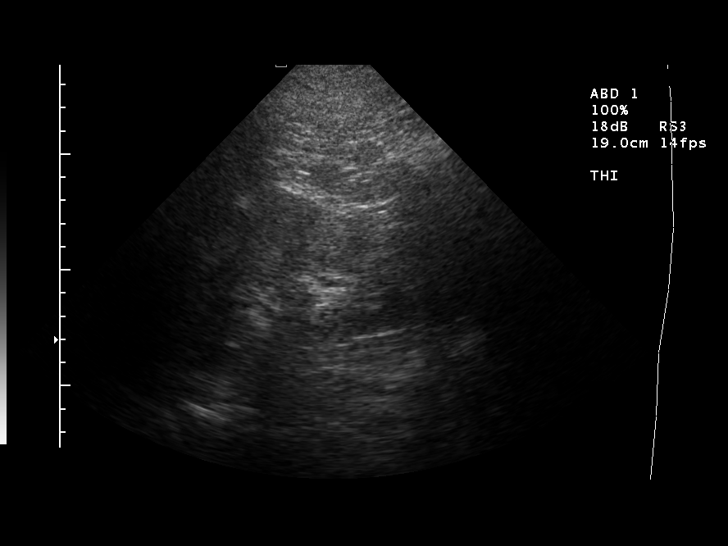
[im 7/42]
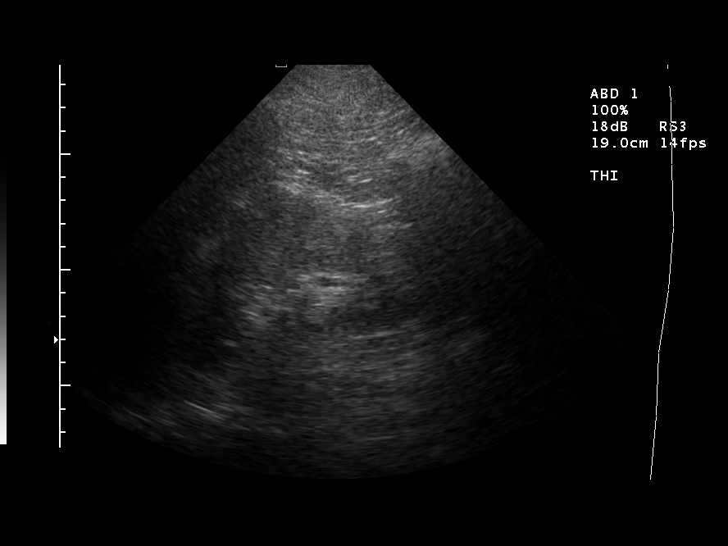
[im 11/42]
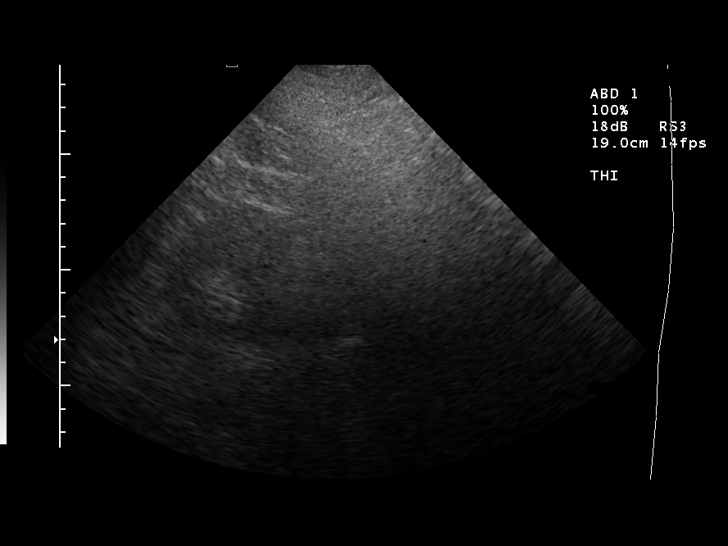
[im 14/42]
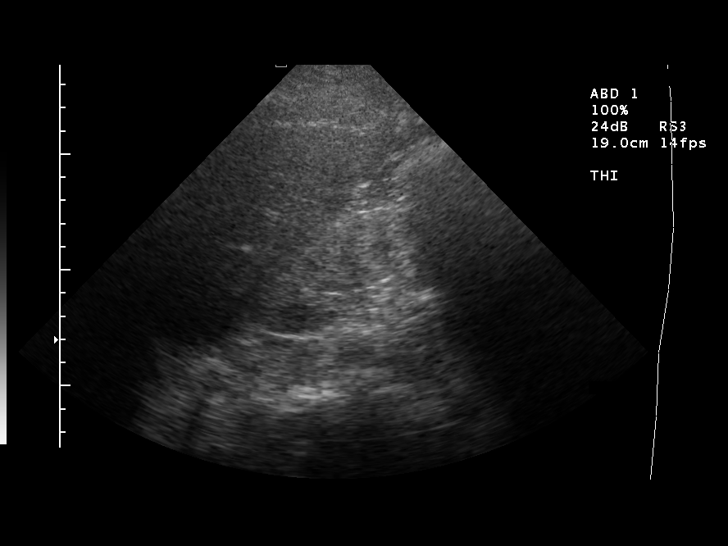
[im 16/42]
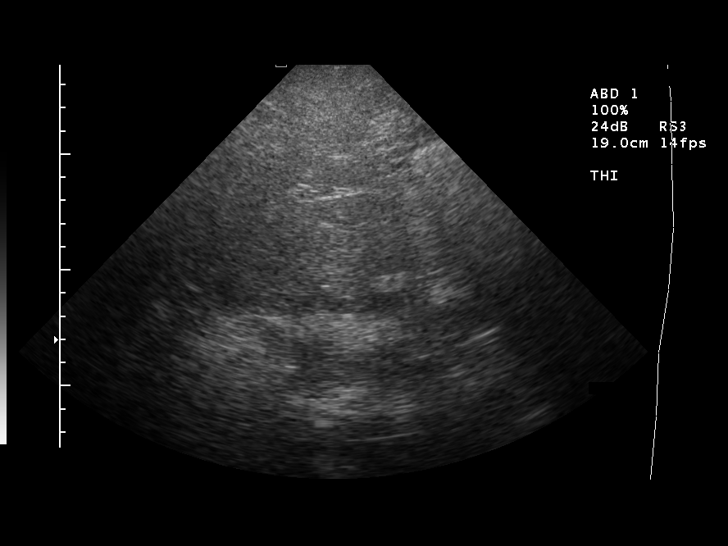
[im 19/42]
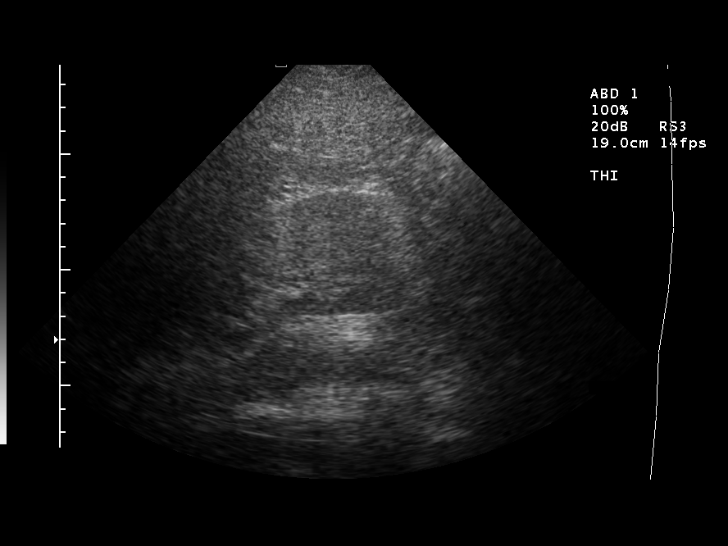
[im 23/42]
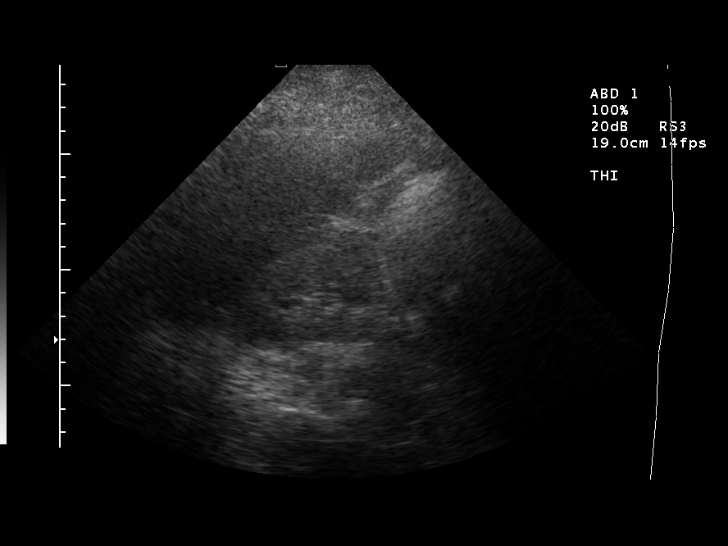
[im 26/42]
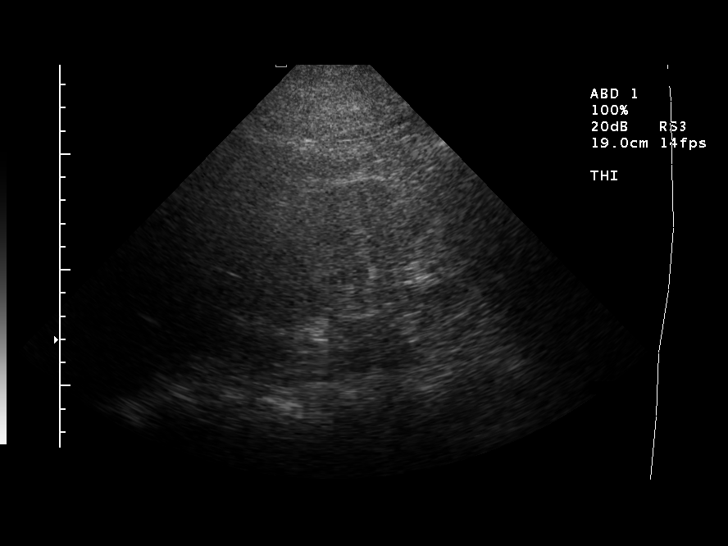
[im 28/42]
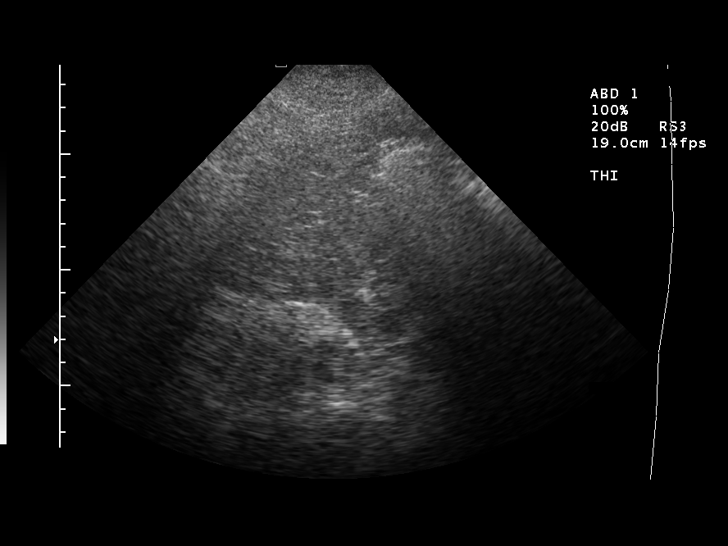
[im 31/42]
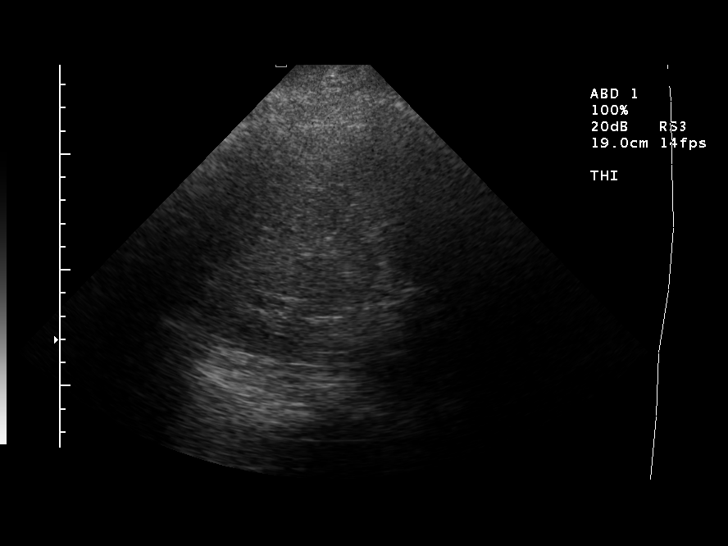
[im 35/42]
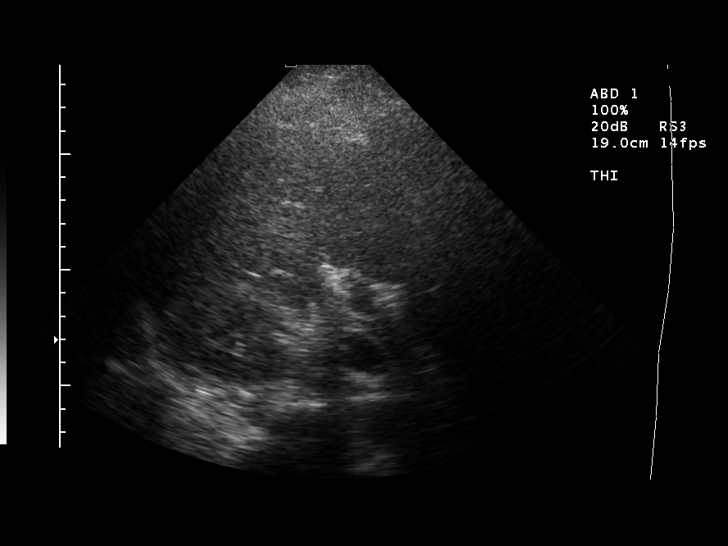
[im 38/42]
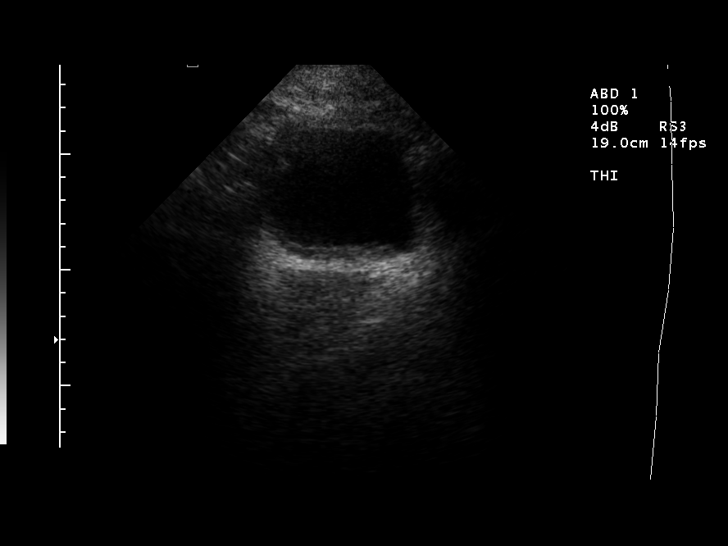
[im 42/42]
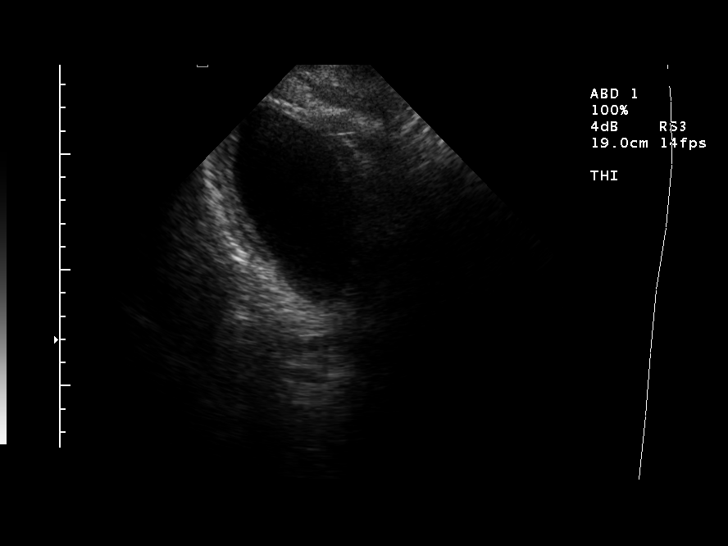

[14 of 25 positions shown; findings below may reference images not displayed]

## 2004-10-01 IMAGING — CR DG CHEST 2V
3 series · 3 of 3 positions shown · non-contrast
Comparison: none

CLINICAL DATA: Fever, cellulitis.  Chest pain.
 CHEST TWO VIEWS
 Comparison 09/13/03.
 Mild cardiac prominence.  Left subclavian Port-A-Cath, tip right atrium.  Tortuous aorta.  Mild bronchitic changes.  No infiltrate, effusion or mass.  No acute bony lesions.
 IMPRESSION
 Mild cardiac enlargement.  Mild bronchitic changes.

[view not recorded (1 of 3)]
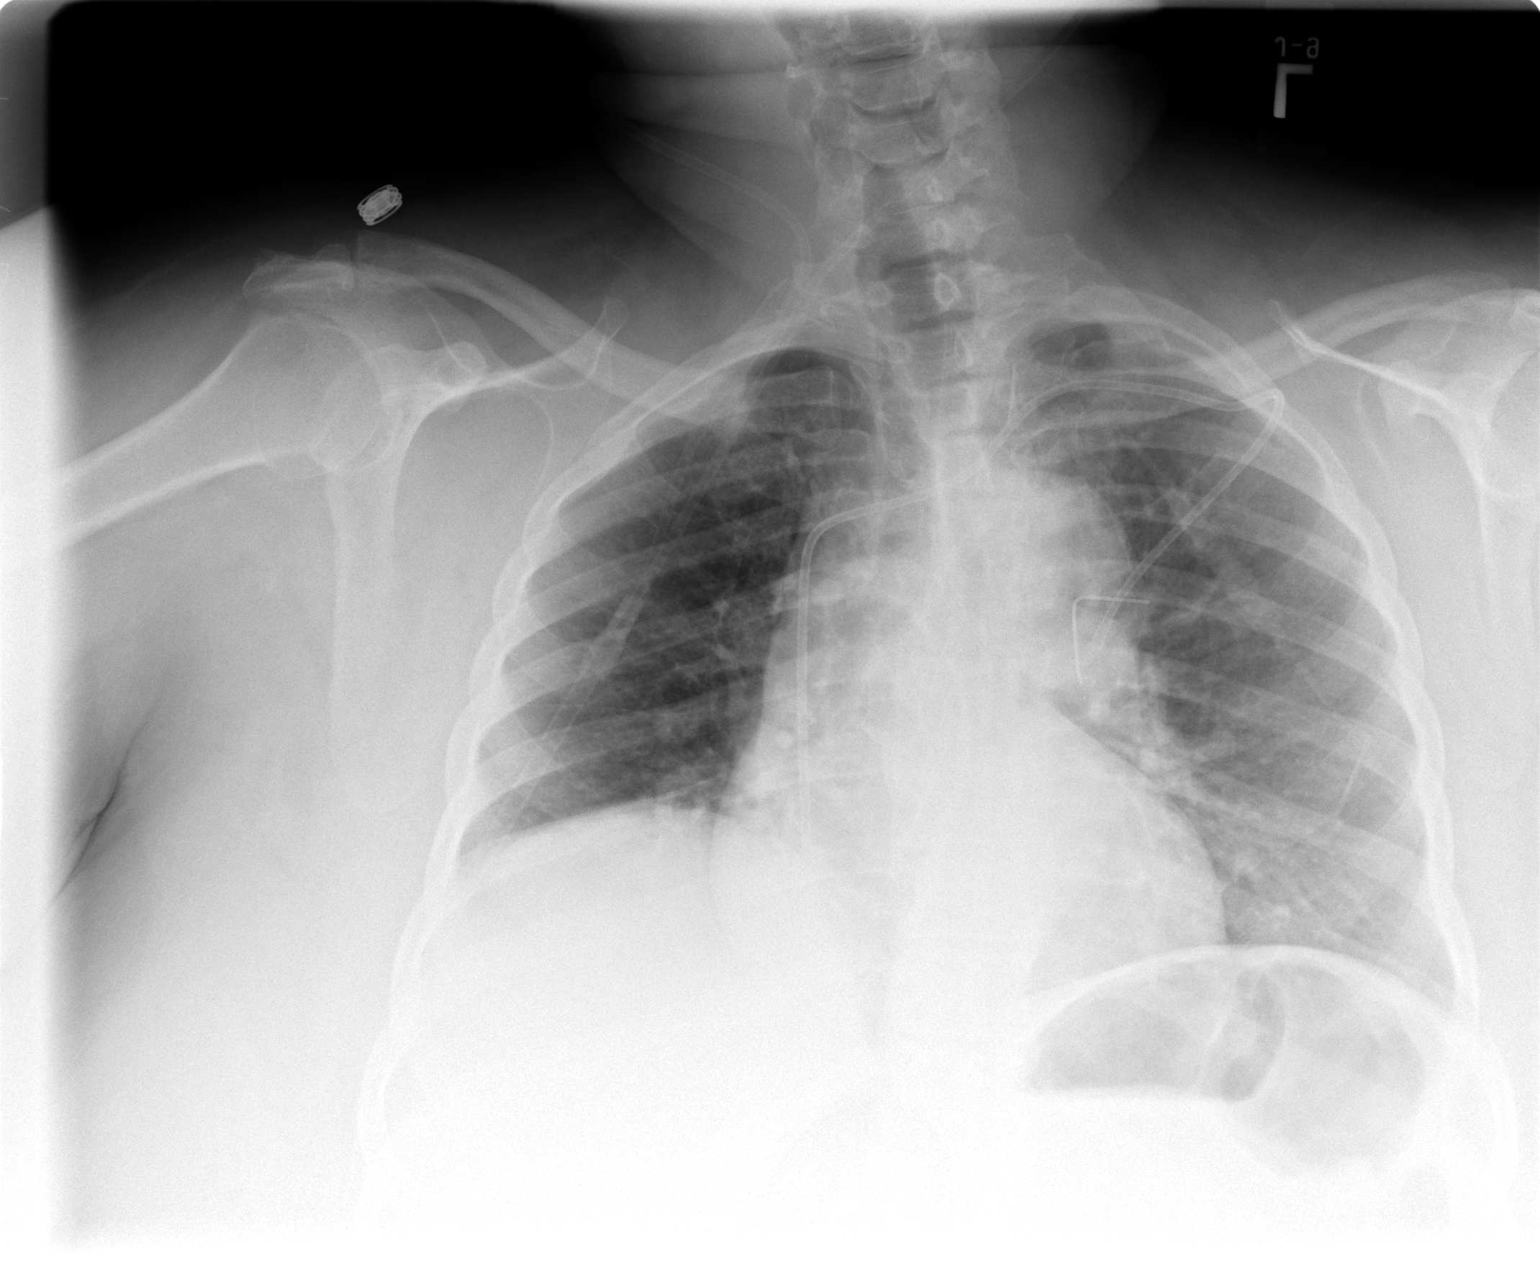

[view not recorded (2 of 3)]
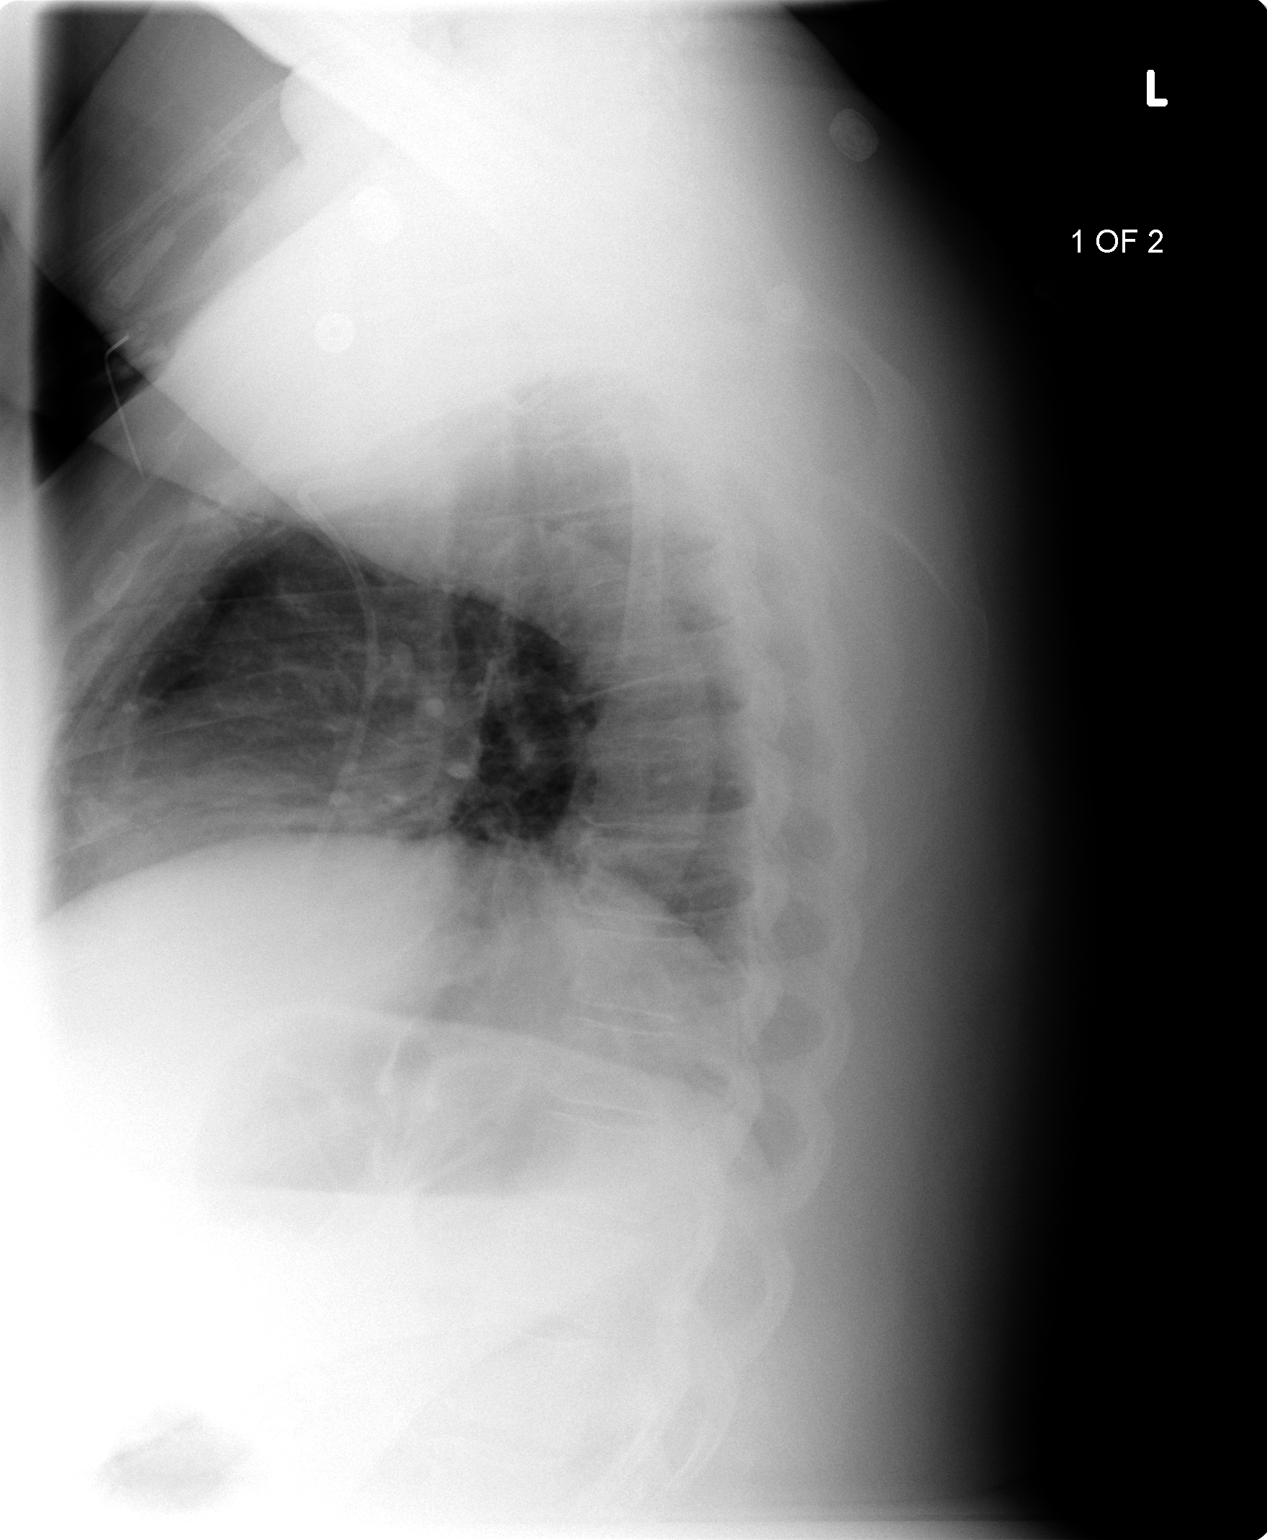

[view not recorded (3 of 3)]
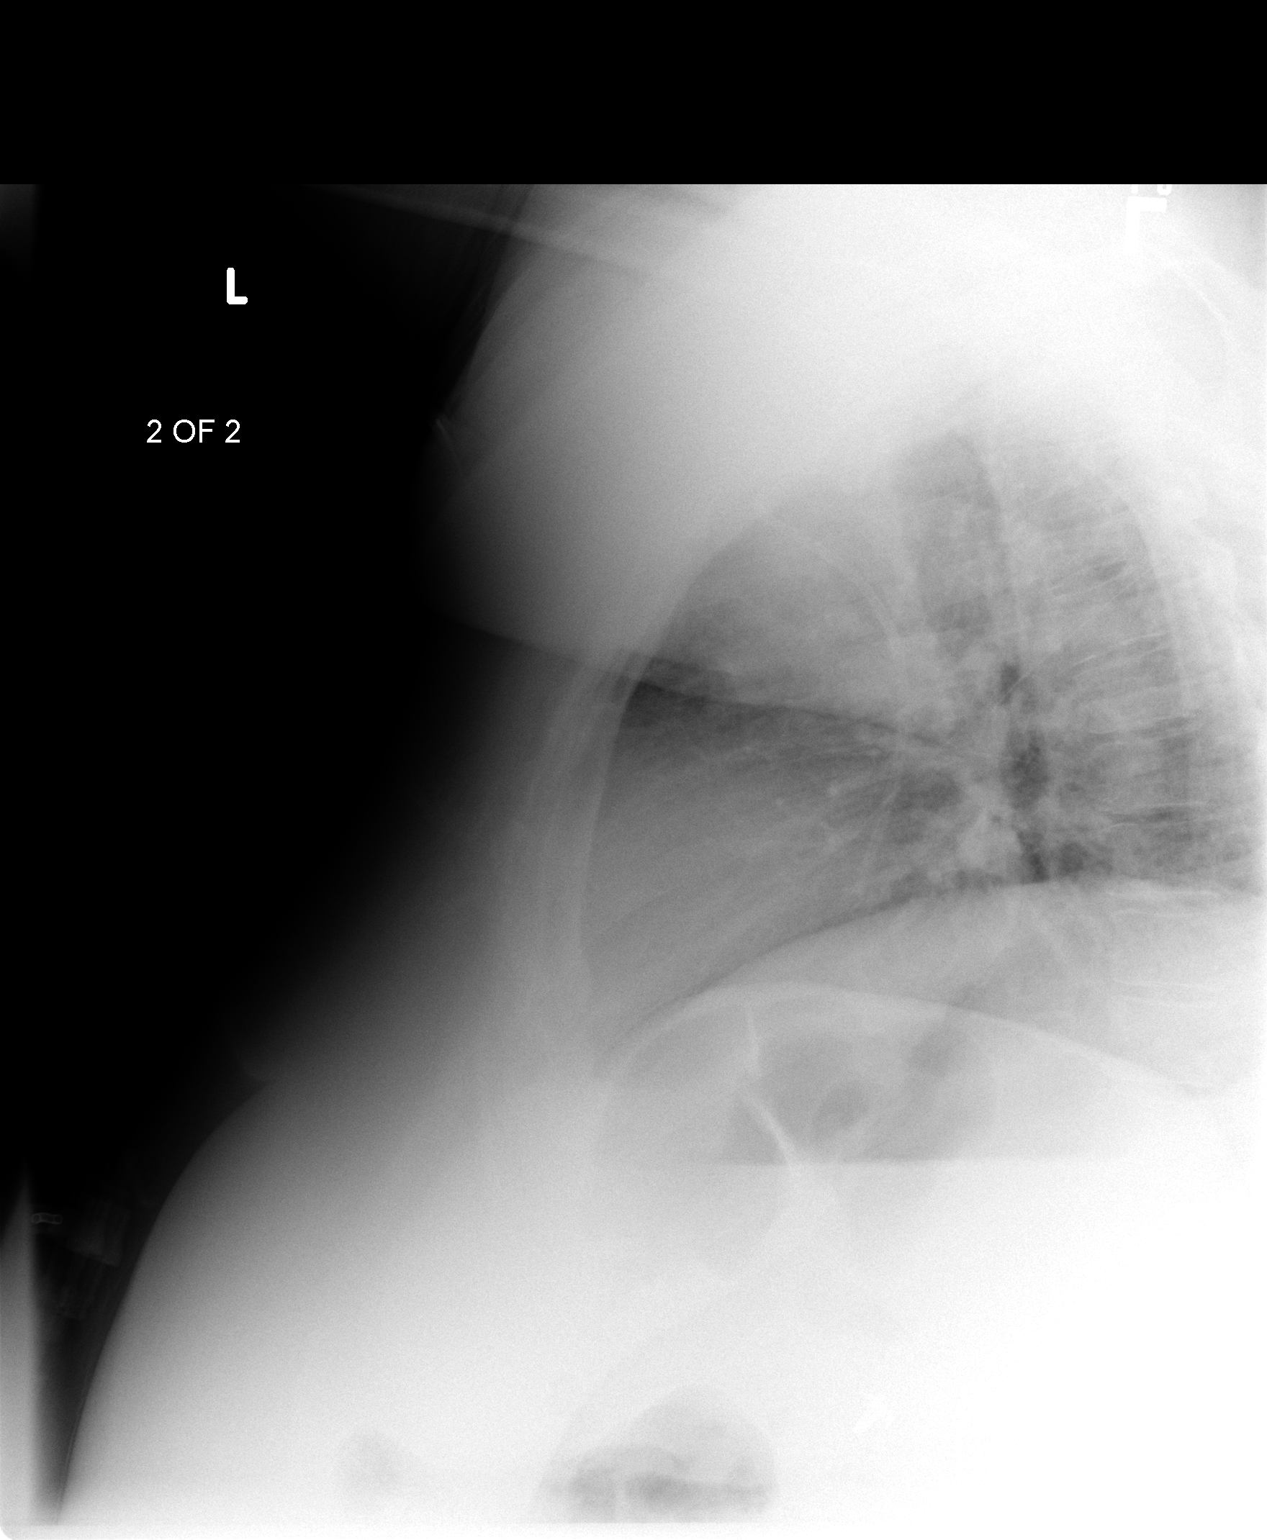

[3 of 3 positions shown; findings below may reference images not displayed]

## 2004-11-01 ENCOUNTER — Encounter: Admission: RE | Admit: 2004-11-01 | Discharge: 2004-11-01 | Payer: Self-pay | Admitting: Unknown Physician Specialty

## 2004-11-02 IMAGING — CR DG CHEST 2V
2 series · 2 of 2 positions shown · non-contrast
Comparison: Portable chest earlier in the day at [DATE] hours.

CLINICAL DATA: Chest pain, hypoxia. 
 CHEST, TWO VIEWS ([DATE] HOURS)

[view not recorded (1 of 2)]
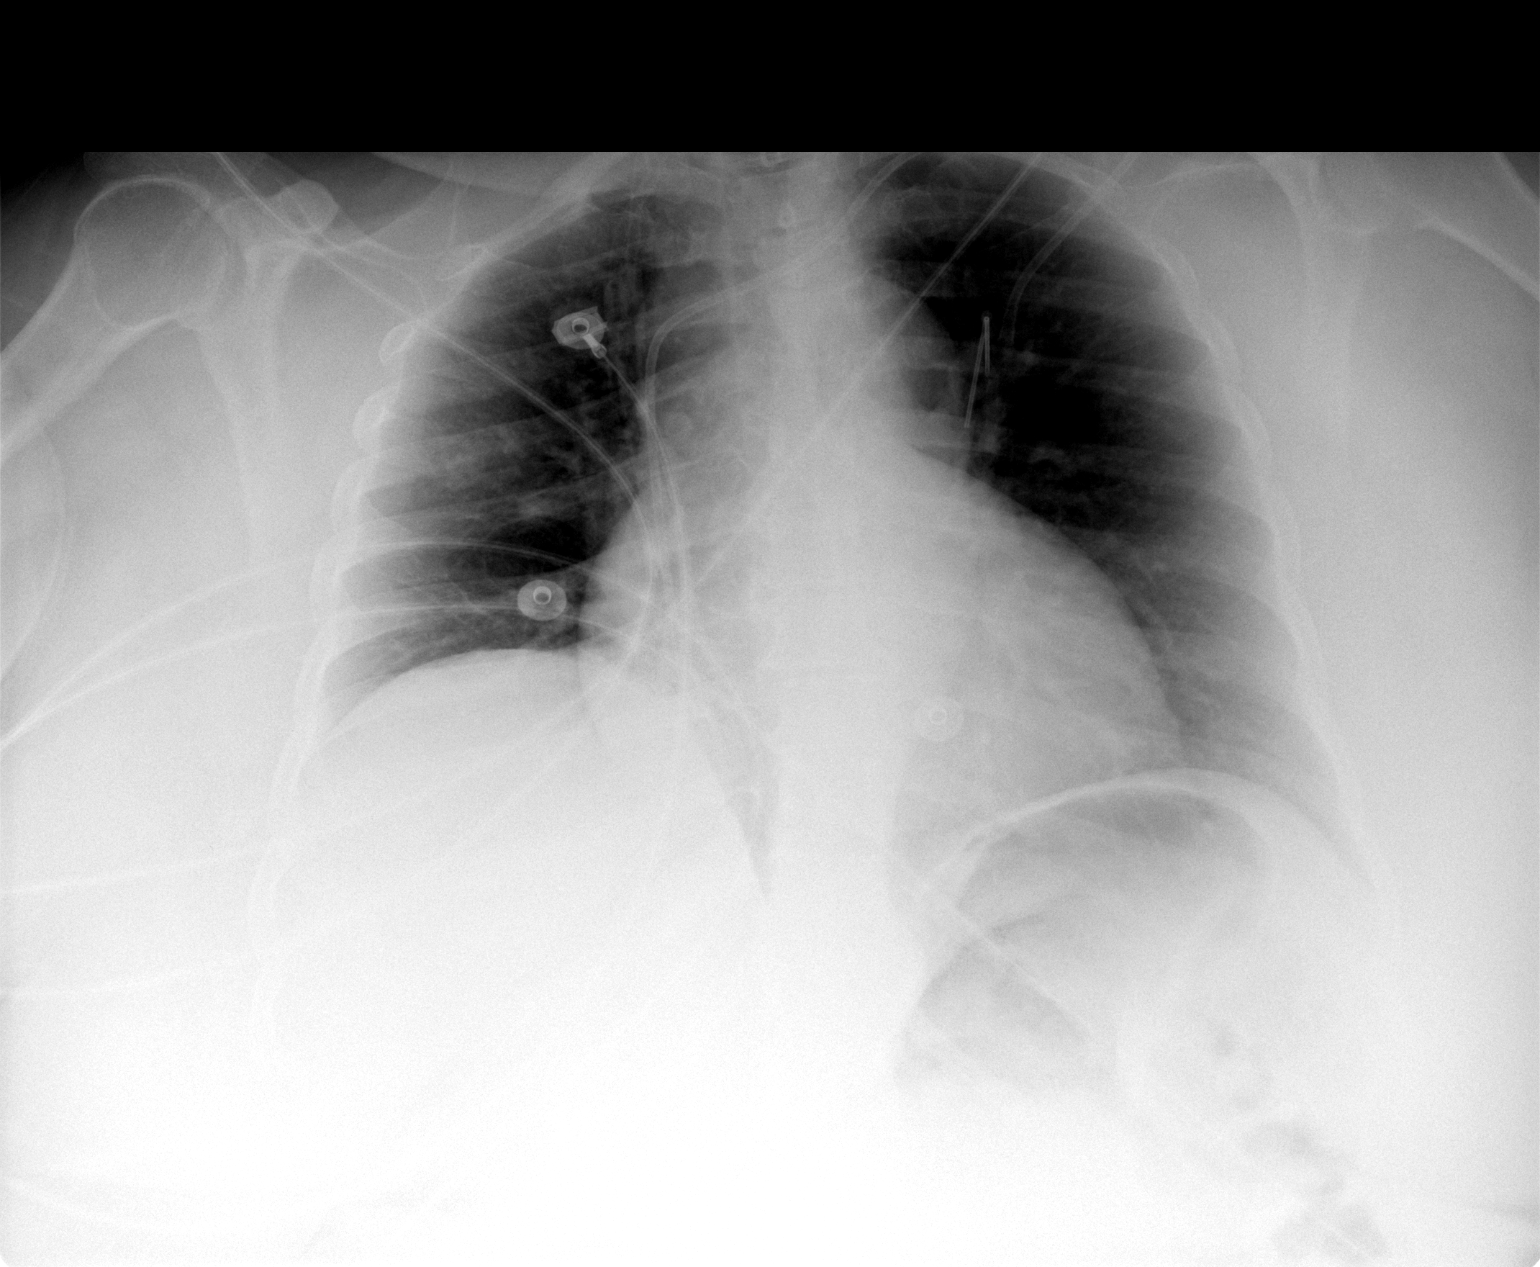

[view not recorded (2 of 2)]
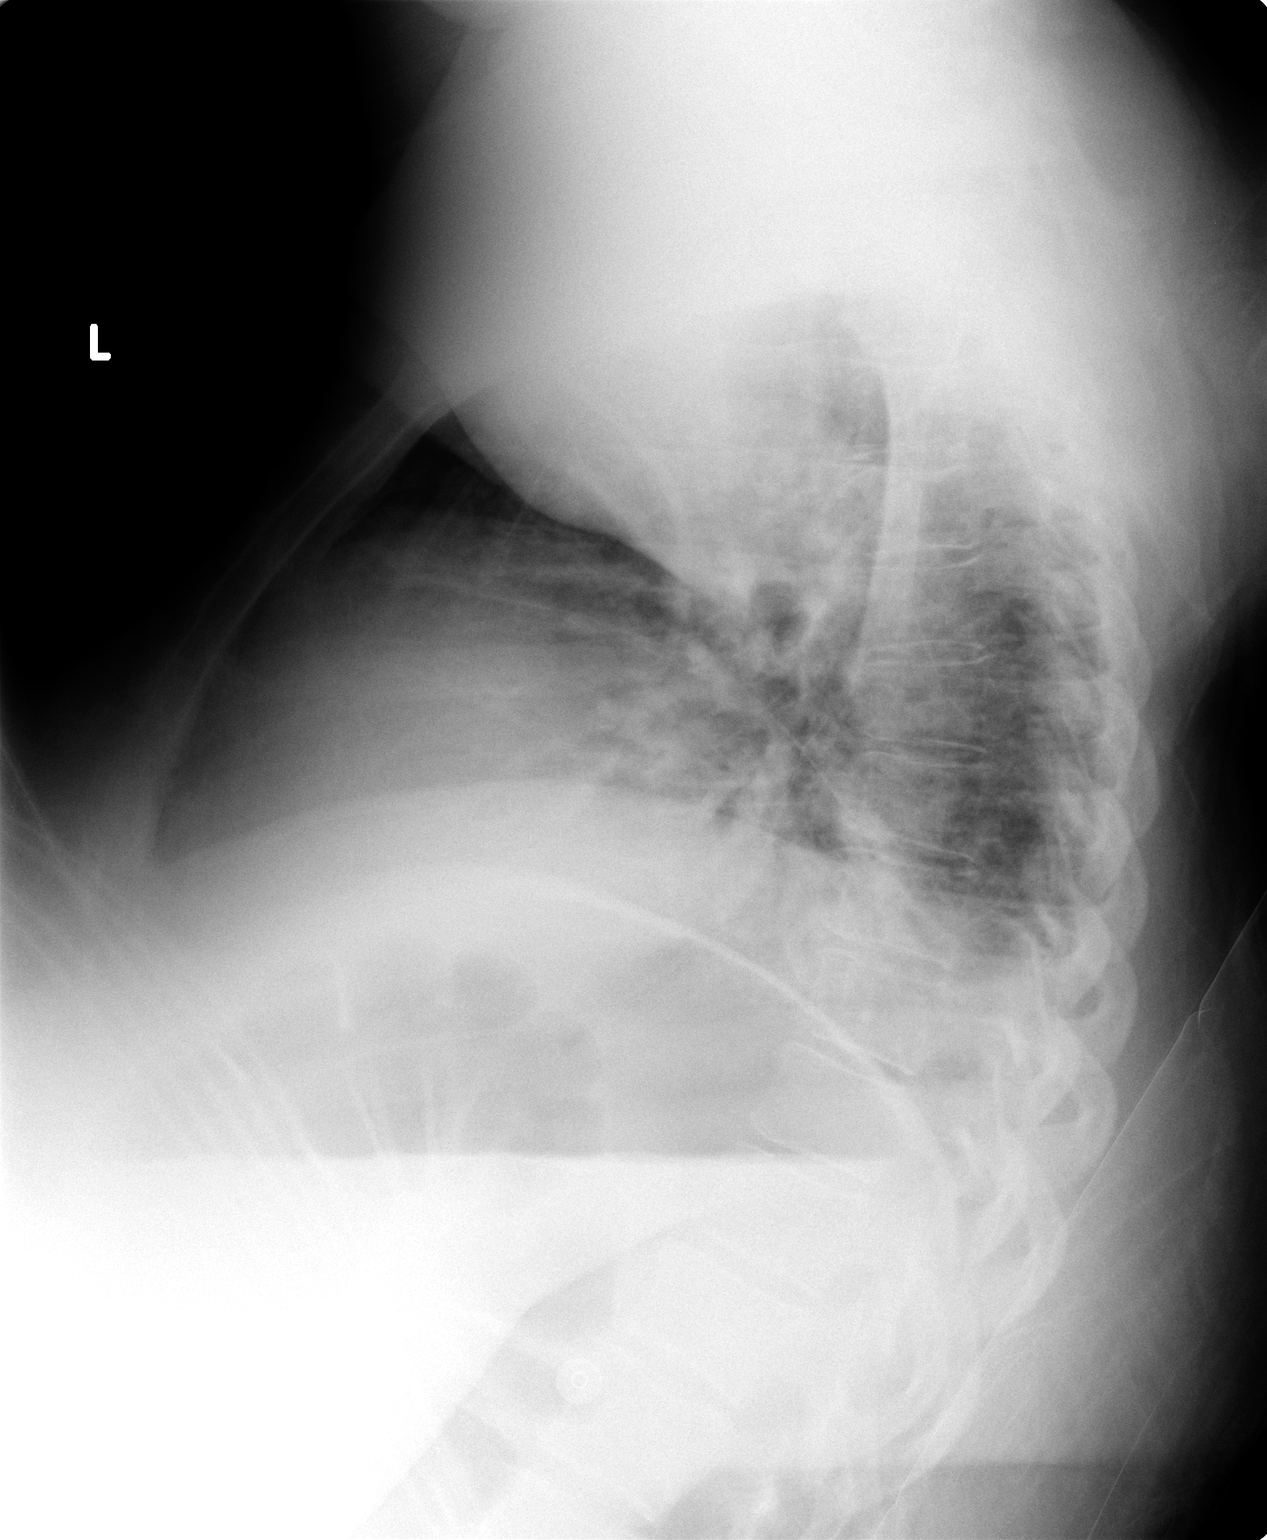

[2 of 2 positions shown; findings below may reference images not displayed]

Inspiration is again suboptimal due to the patient?s body habitus and this accounts for crowding of the bronchovascular markings diffusely.  Taking this into account, the lungs appear clear.  Elevation of the right hemidiaphragm again noted and unchanged. Left subclavian port-a-cath tip remains in the SVC.  Mild degenerative changes are present throughout the thoracic spine. 
 IMPRESSION
 Suboptimal inspiration. No evidence of acute disease.

## 2004-11-02 IMAGING — CR DG CHEST 1V PORT
1 series · 1 of 1 positions shown · non-contrast
Comparison: none

CLINICAL DATA: Chest pain.
 PORTABLE CHEST, 10/26/03, [DATE] HOURS
 Comparison 09/24/03
 The left subclavian port catheter appears stable in position.  Relatively low lung volumes without confluent infiltrate or overt edema.  There is mild apparent enlargement of the cardiac silhouette, which is probably emphasized by the technique and positioning.  No effusion.
 IMPRESSION 
 Stable appearance since 09/24/03.

[view not recorded]
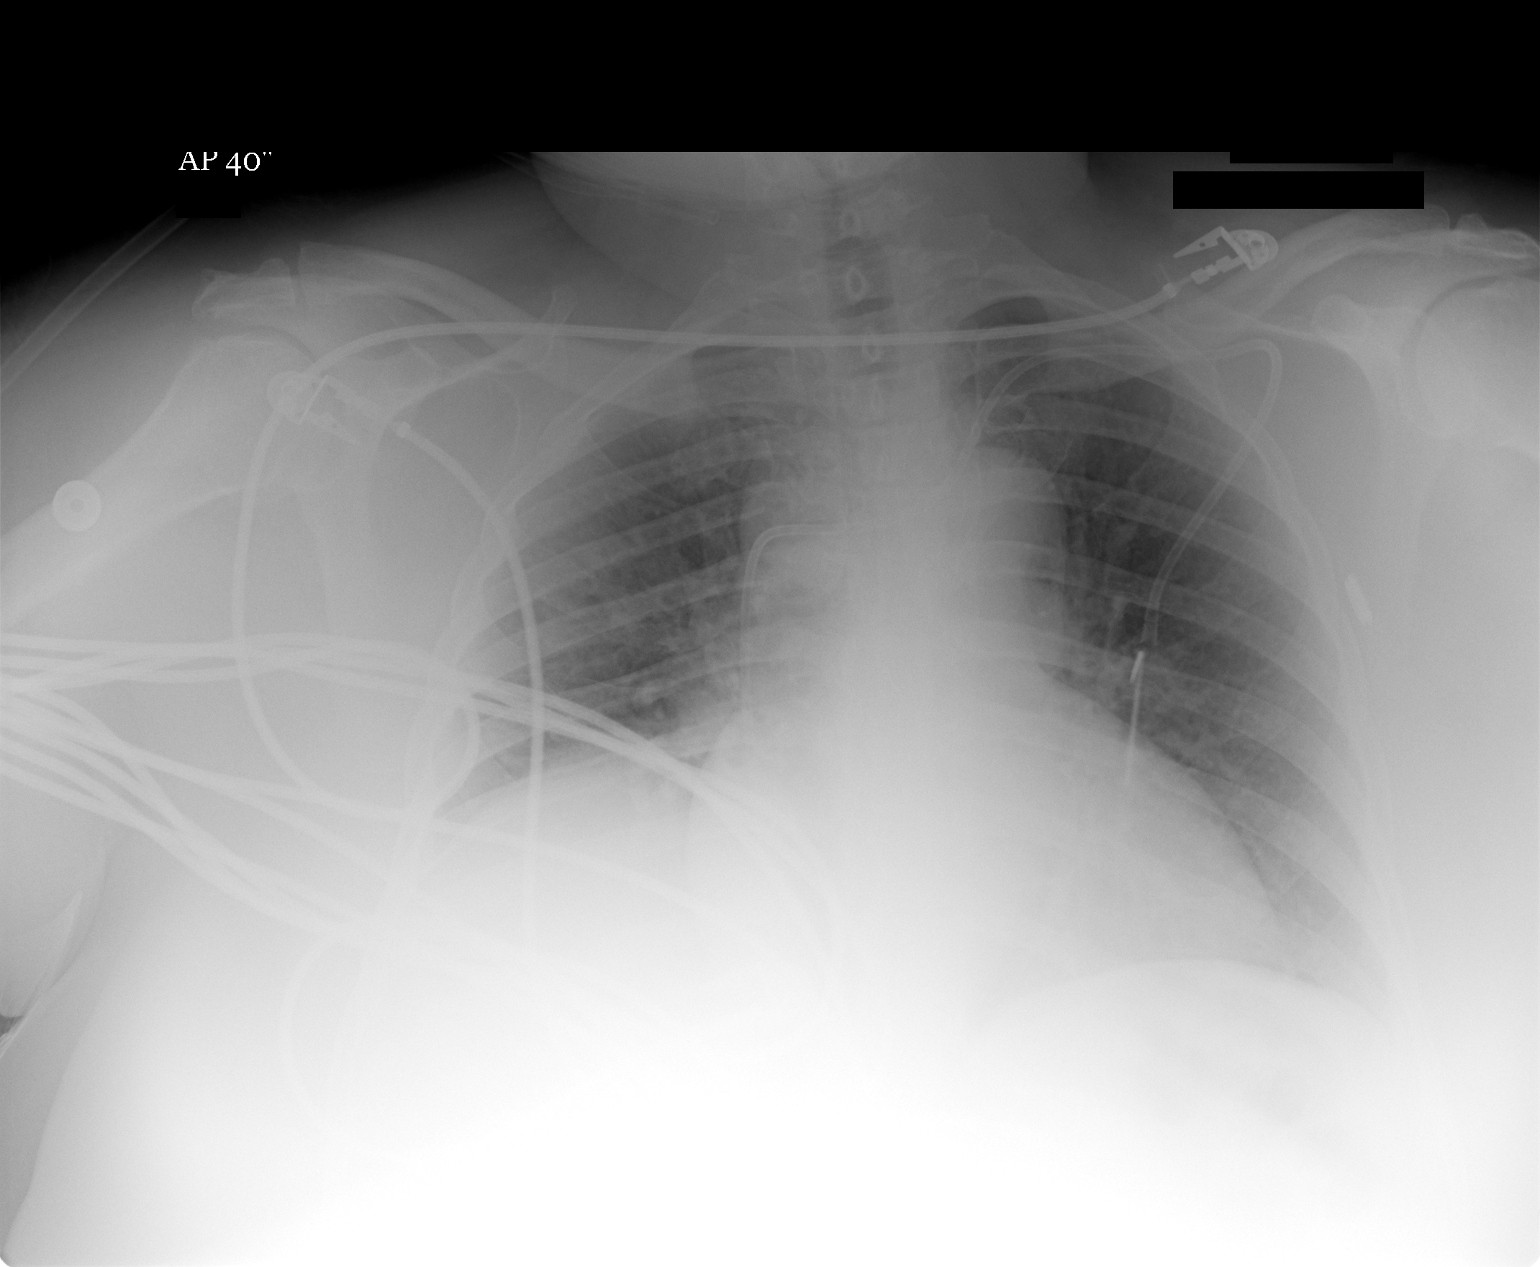

[1 of 1 positions shown; findings below may reference images not displayed]

## 2004-11-12 ENCOUNTER — Ambulatory Visit: Payer: Self-pay | Admitting: Internal Medicine

## 2004-11-12 ENCOUNTER — Inpatient Hospital Stay (HOSPITAL_COMMUNITY): Admission: EM | Admit: 2004-11-12 | Discharge: 2004-11-15 | Payer: Self-pay | Admitting: Emergency Medicine

## 2004-11-18 IMAGING — CT CT HEAD W/O CM
1 series · 16 of 30 positions shown, 20 images · non-contrast
Comparison: none

CLINICAL DATA: Headache. 
 CT HEAD WITHOUT CONTRAST
TECHNIQUE: Multidetector helical scanning obtained from the skull base to the vertex.

[Series 2: brain · axial · 0.47mm/px · z∈[+143,+267]mm · 16 of 34 slices shown, 20 images]
[im 2/34  brain]
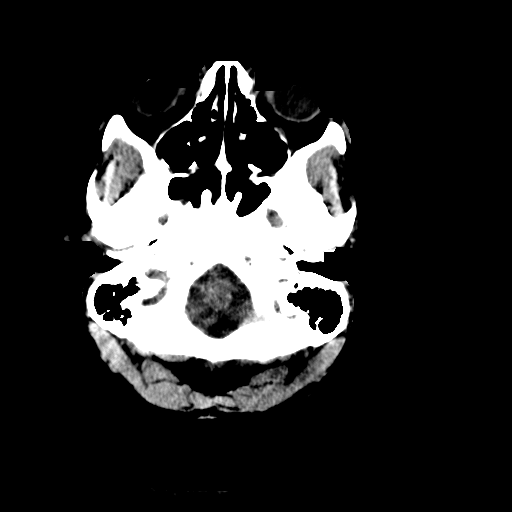
[im 2/34  bone]
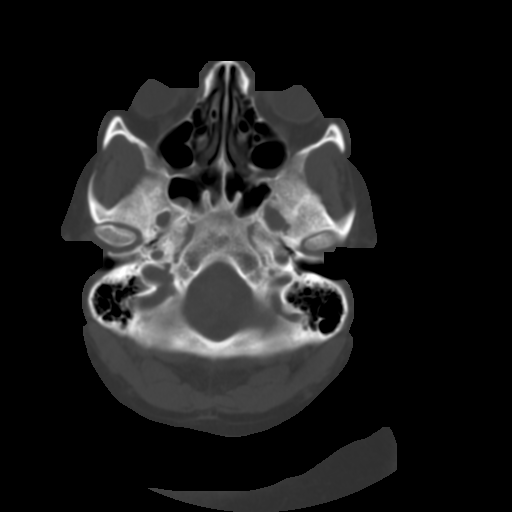
[im 4/34  brain]
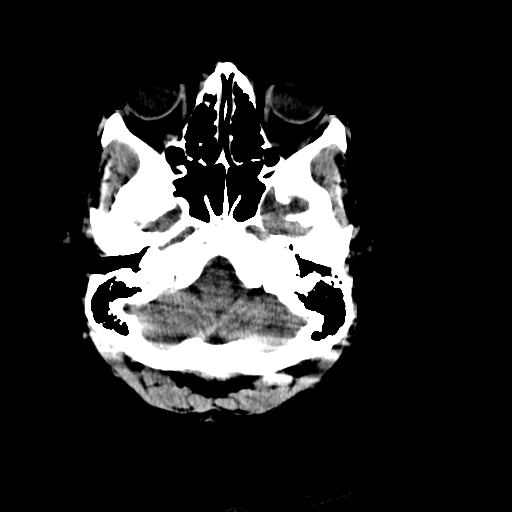
[im 6/34  brain]
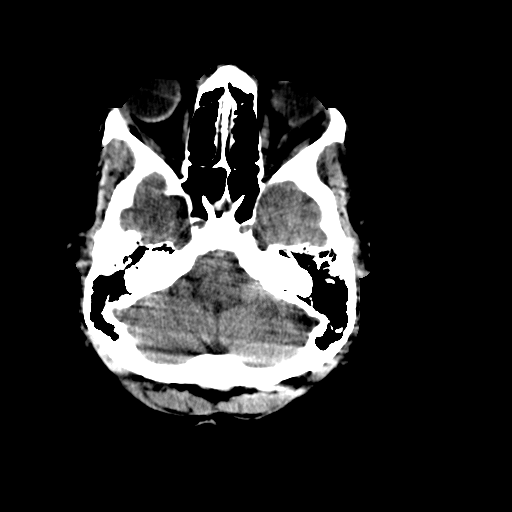
[im 8/34  brain]
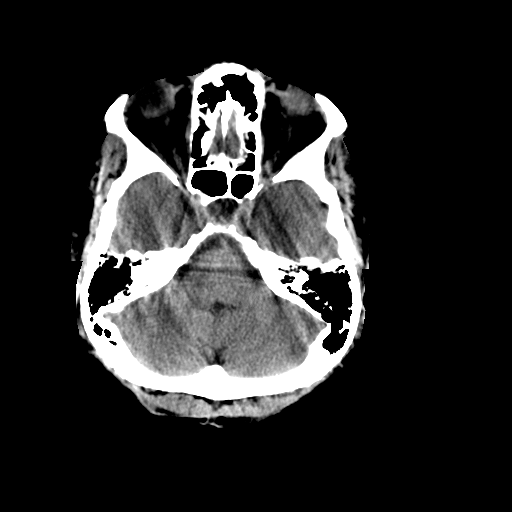
[im 10/34  brain]
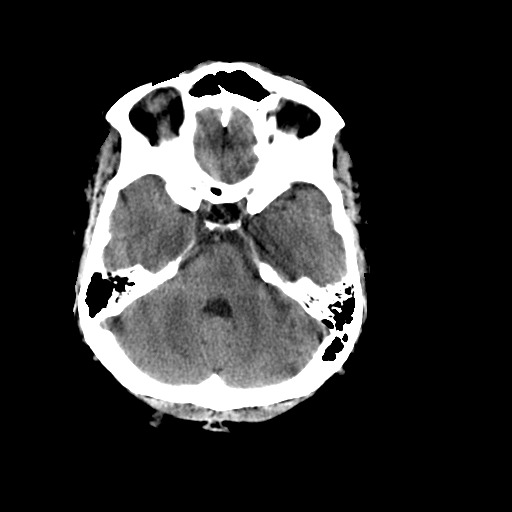
[im 10/34  bone]
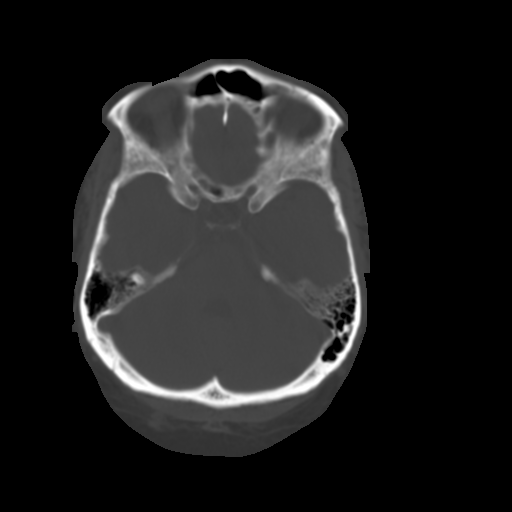
[im 12/34  brain]
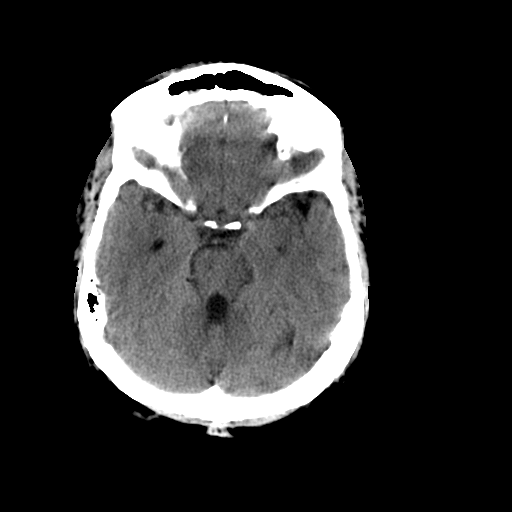
[im 14/34  brain]
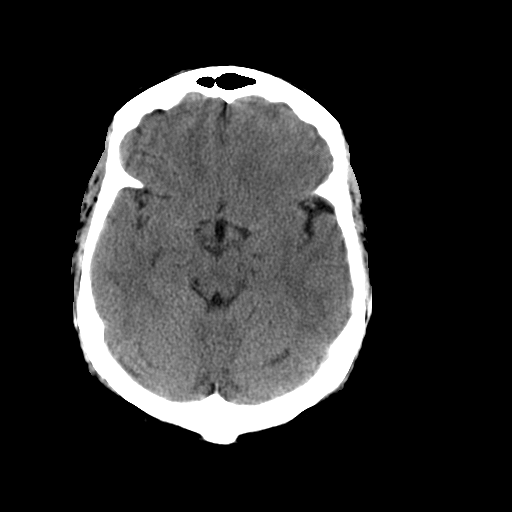
[im 16/34  brain]
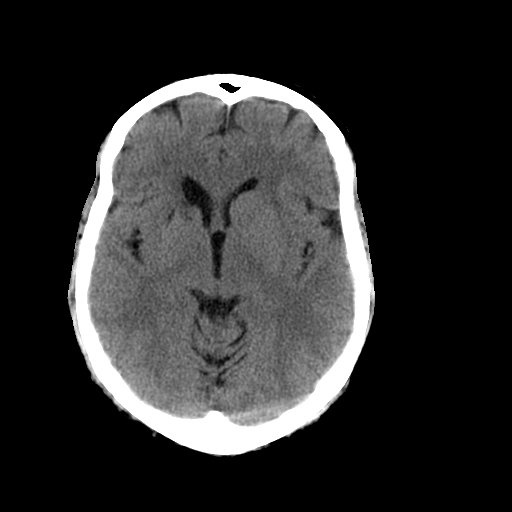
[im 18/34  brain]
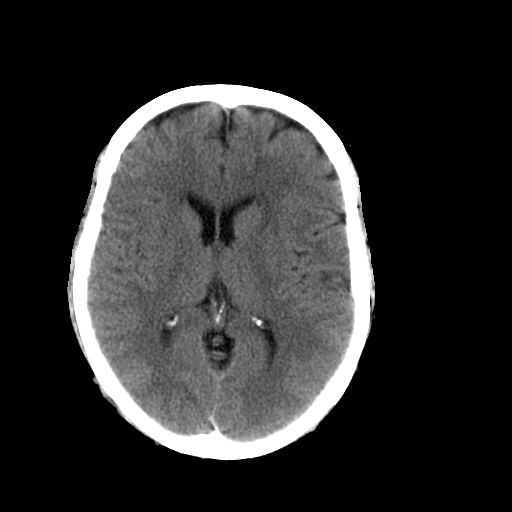
[im 18/34  bone]
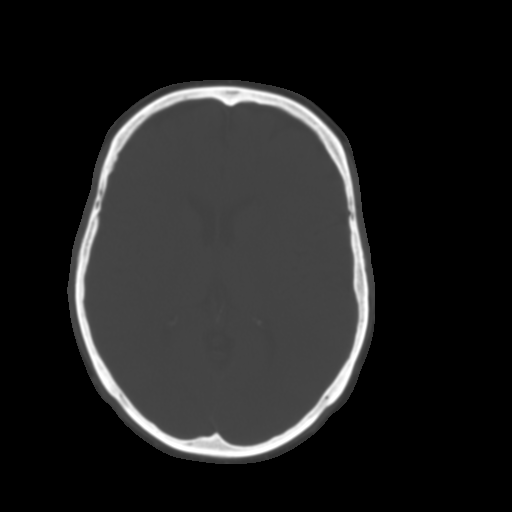
[im 20/34  brain]
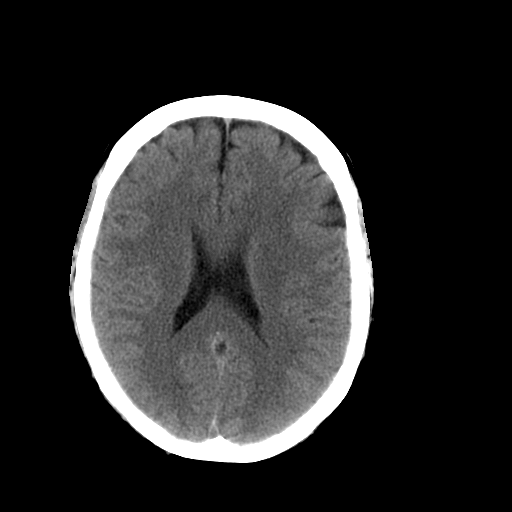
[im 22/34  brain]
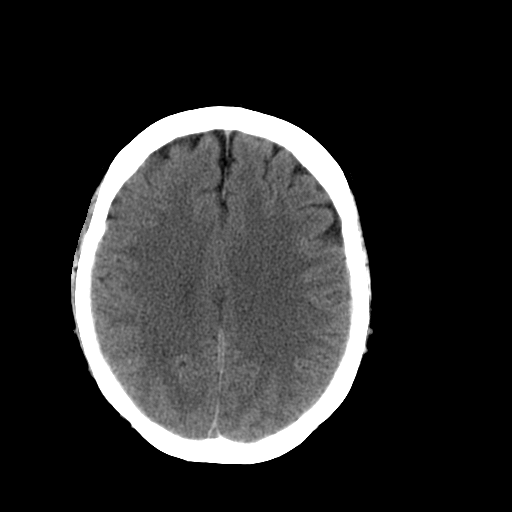
[im 24/34  brain]
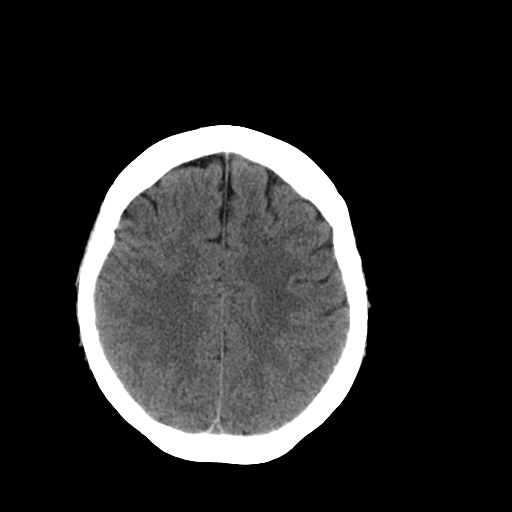
[im 26/34  brain]
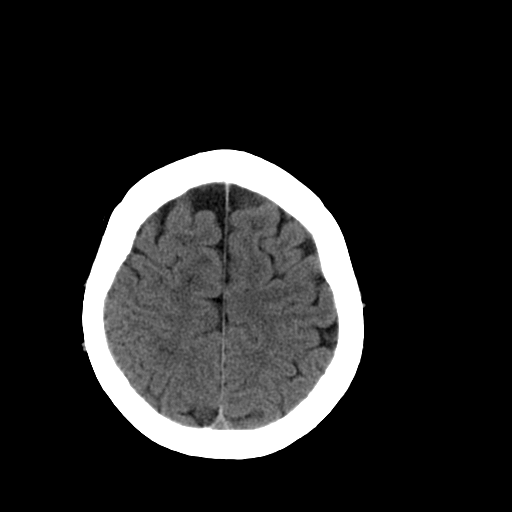
[im 26/34  bone]
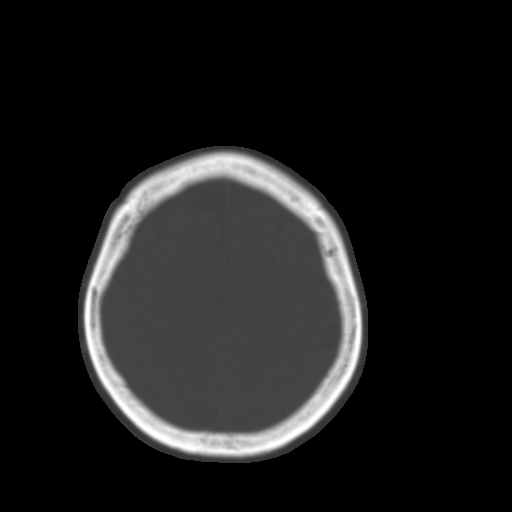
[im 28/34  brain]
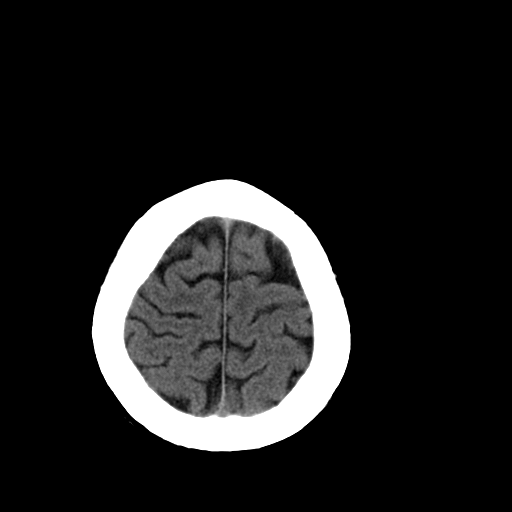
[im 30/34  brain]
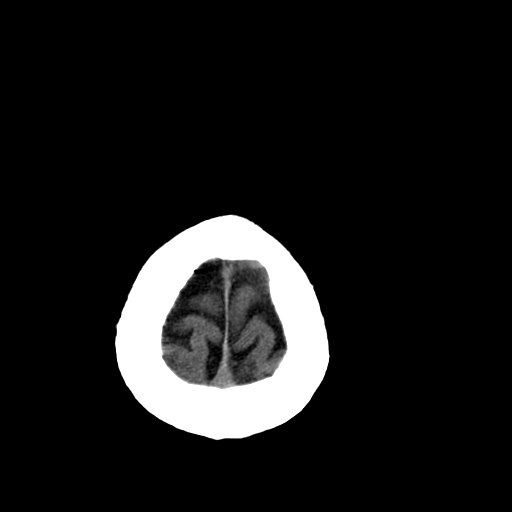
[im 32/34  brain]
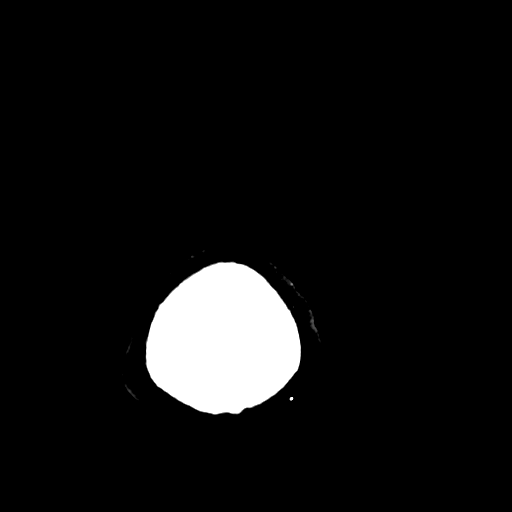

[16 of 30 positions shown; findings below may reference images not displayed]

FINDINGS: No acute intracranial abnormality including mass or mass effect, hydrocephalus, extra-axial fluid collection, midline shift, hemorrhage, or acute infarct.  Acute infarct may be missed by CT for 24 to 48 hours.  Remote infarcts in the left cerebellum and right basal ganglia noted.  Visualized bony calvarium and paranasal sinuses are unremarkable.
 IMPRESSION
 No evidence of acute intracranial abnormality. 
 Remote infarcts in the right basal ganglia and left cerebellum.

## 2004-11-21 IMAGING — CR DG HIP (WITH OR WITHOUT PELVIS) 2-3V*L*
3 series · 3 of 3 positions shown · non-contrast
Comparison: none

CLINICAL DATA: side bruised area
 LEFT HIP COMPLETE
 AP view plus additional views of the left hip including a frogleg lateral view reveals degenerative changes of the hip but no evidence of fracture or other acute abnormality.  Degenerative changes of the pelvis in general but without focal abnormality.
 IMPRESSION
 Degenerative changes noted in the left hip without abnormality.

[view not recorded (1 of 3)]
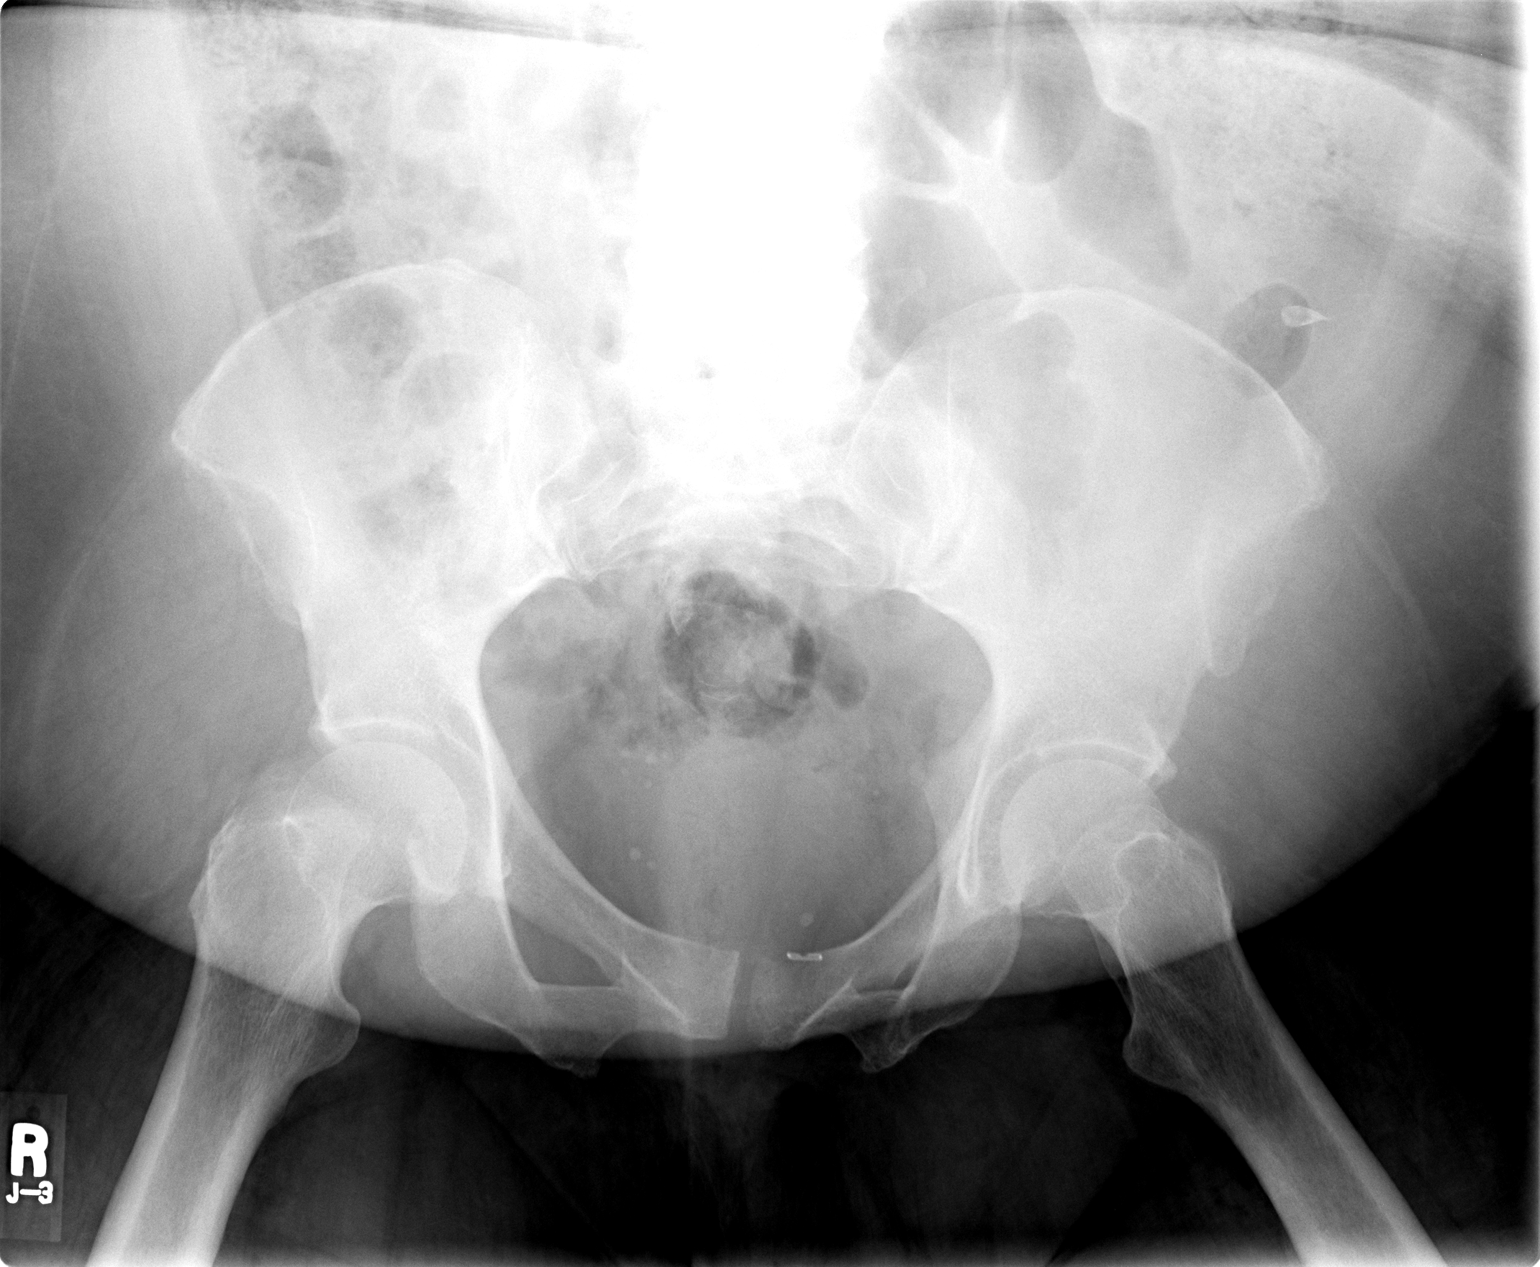

[view not recorded (2 of 3)]
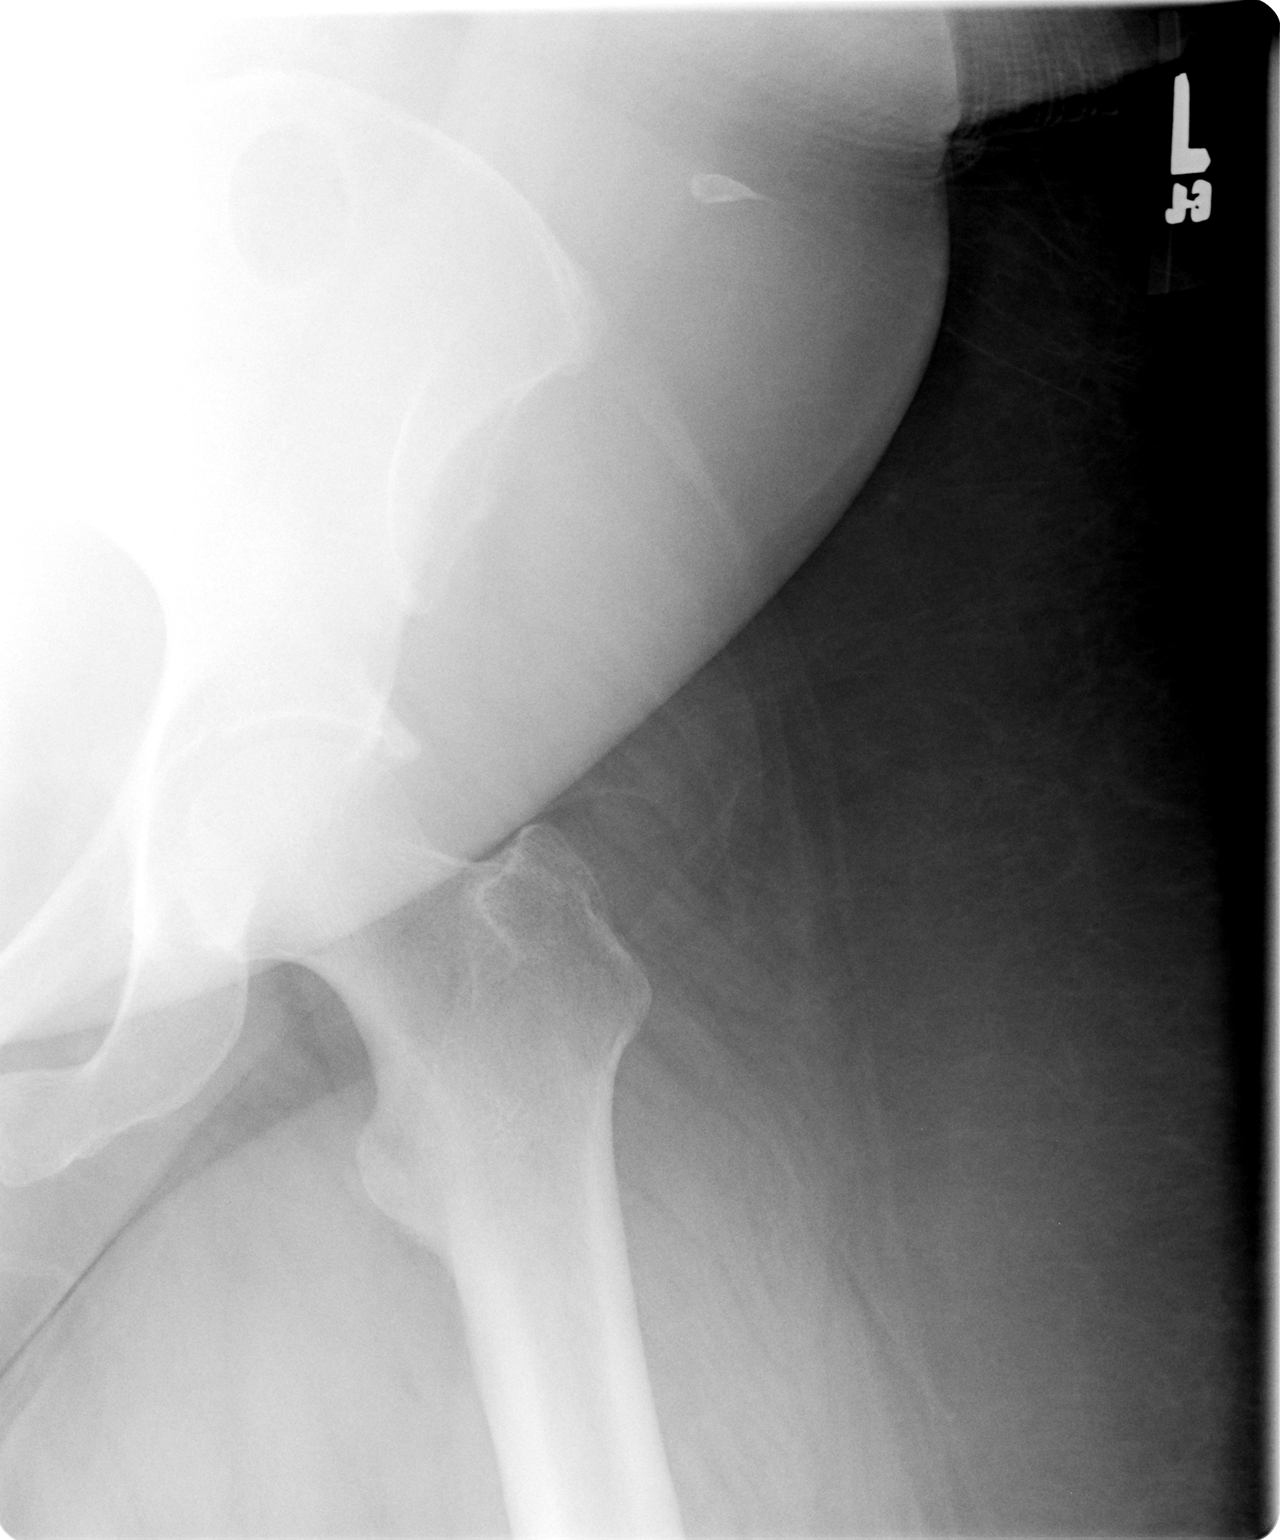

[view not recorded (3 of 3)]
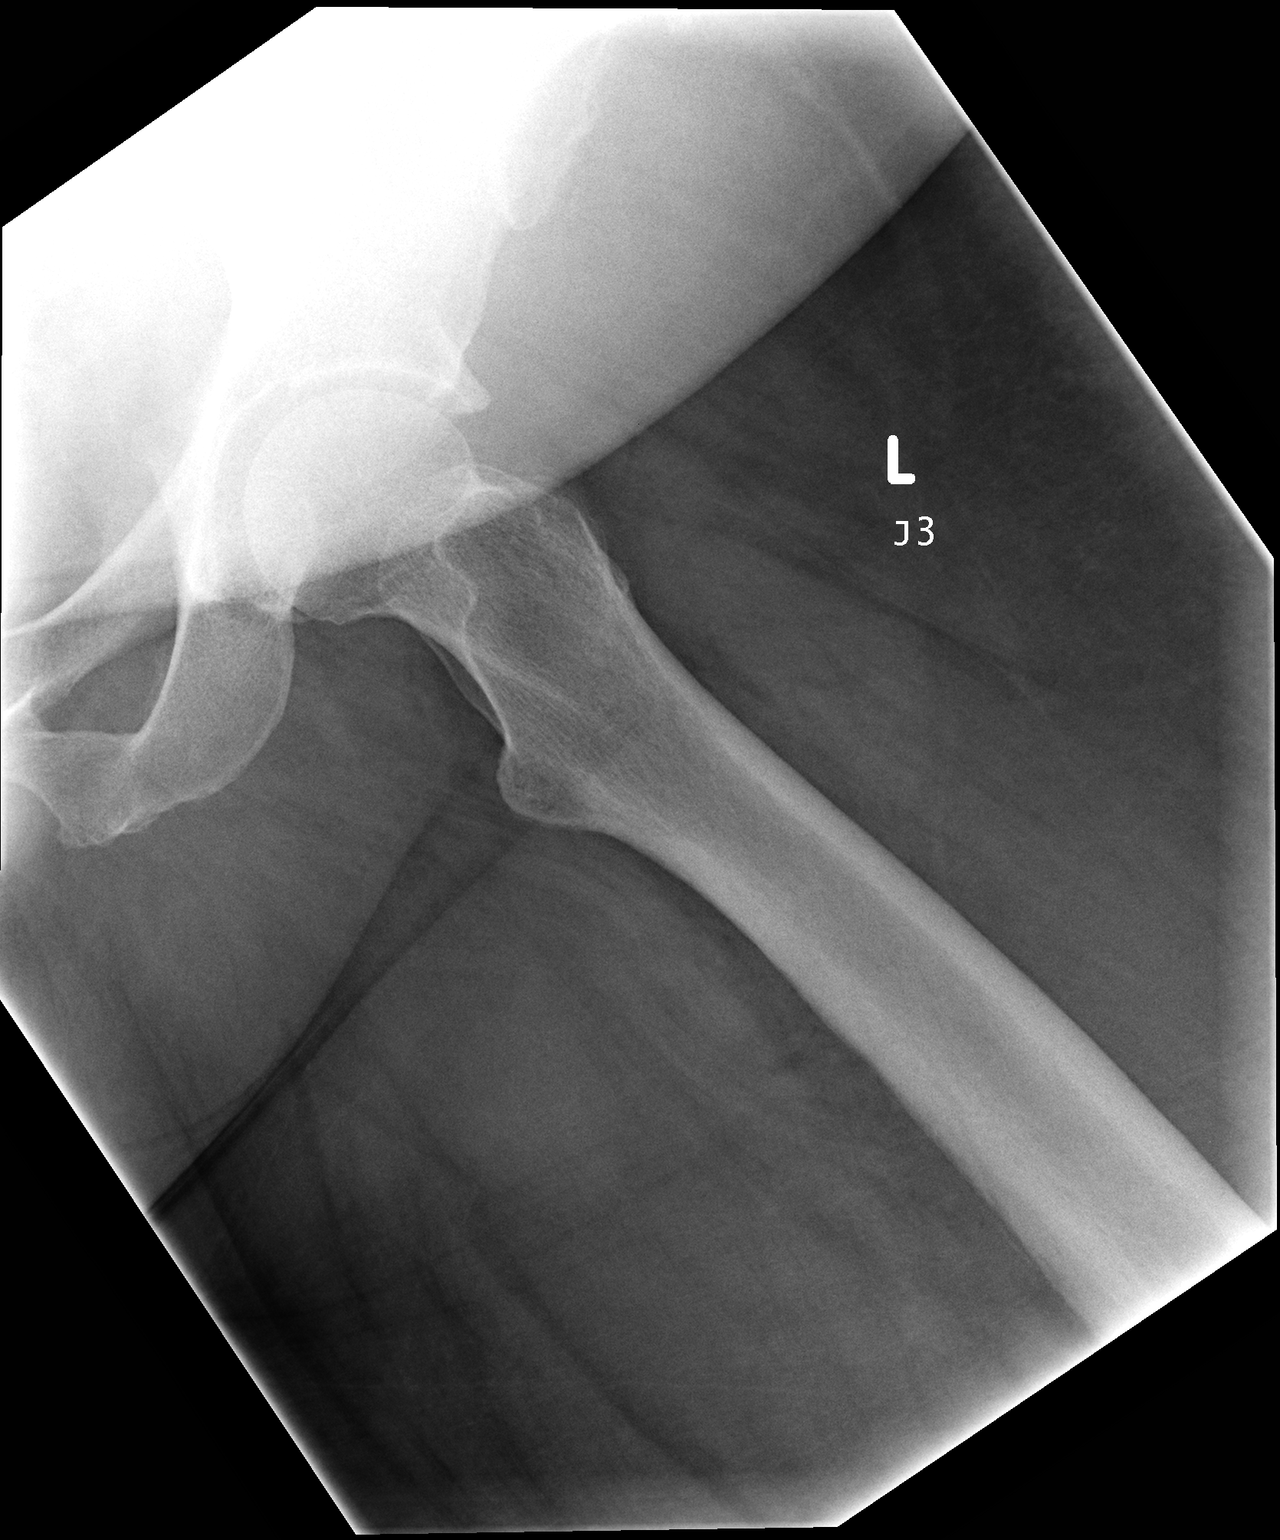

[3 of 3 positions shown; findings below may reference images not displayed]

## 2004-11-23 IMAGING — CR DG CHEST 1V PORT
1 series · 1 of 1 positions shown · non-contrast
Comparison: 10/26/2003.

CLINICAL DATA: Shortness of breath.  
 AP SEMIERECT PORTABLE CHEST ? 11/16/2003 AT 0060

[view not recorded]
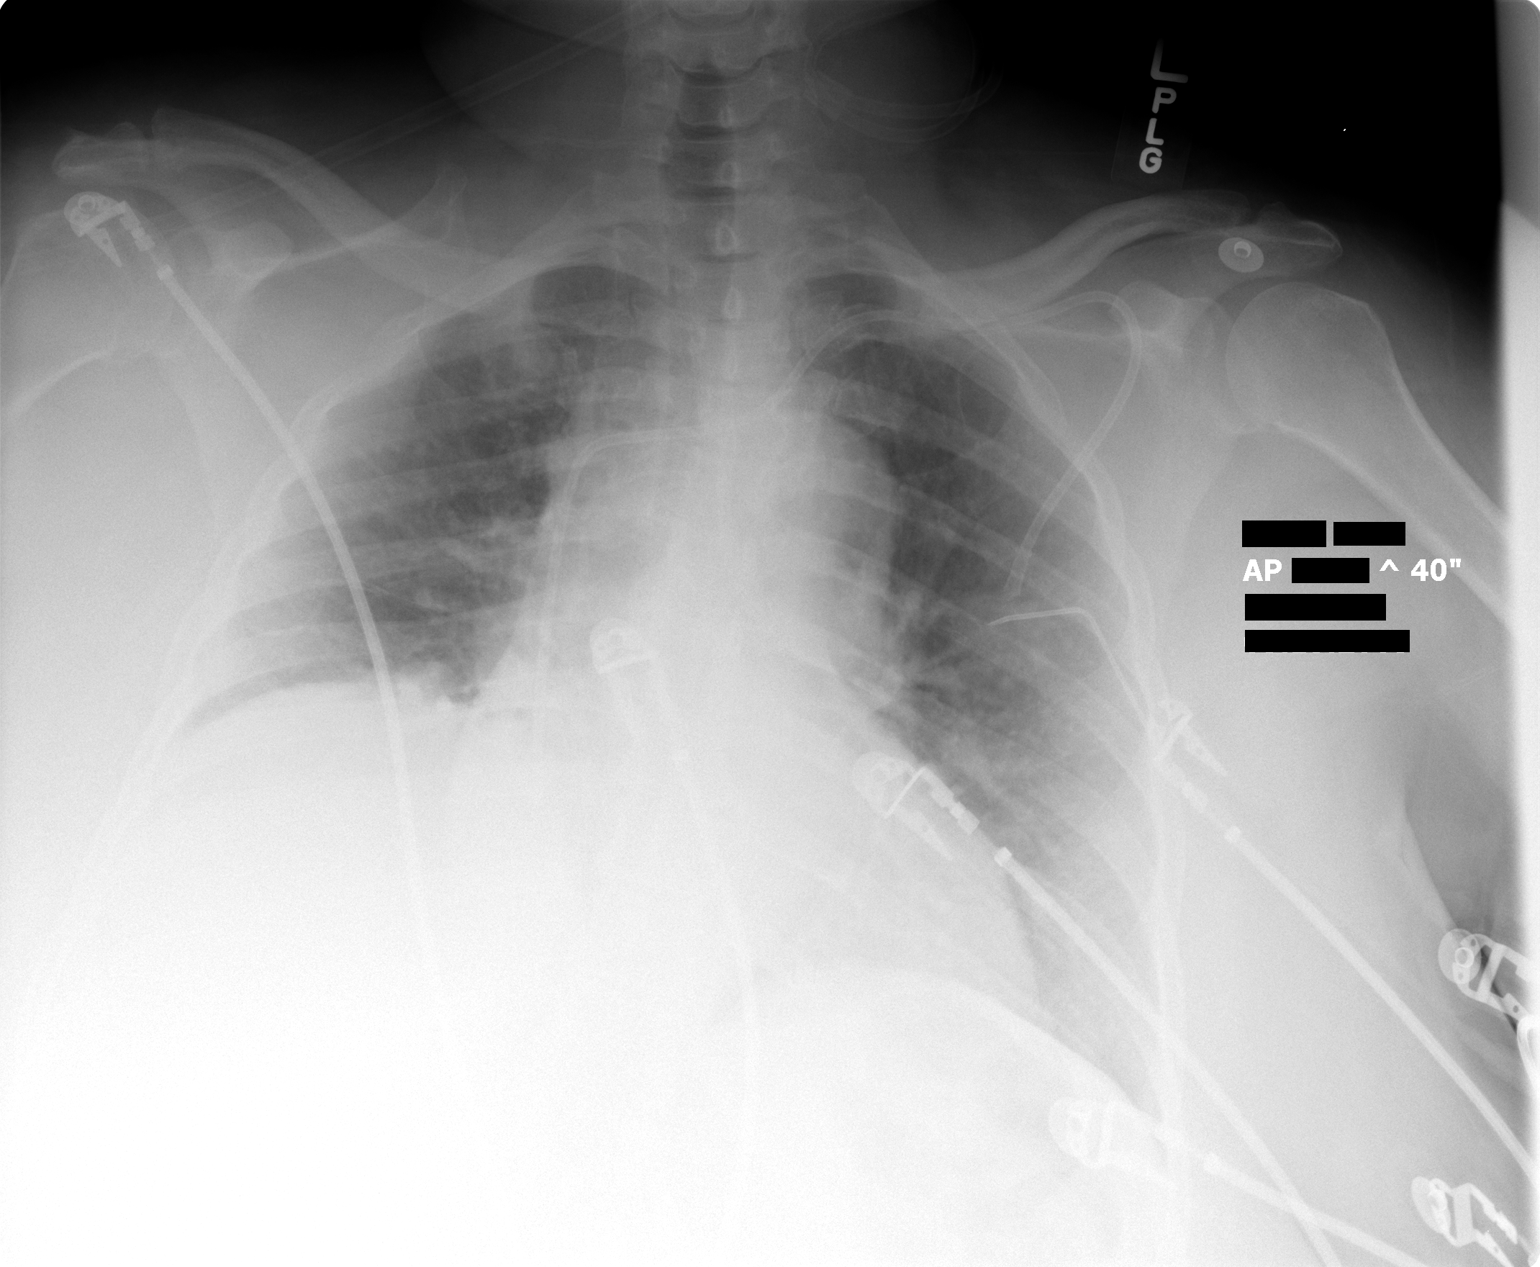

[1 of 1 positions shown; findings below may reference images not displayed]

FINDINGS: Port-a-cath is in place and presently accessed.  The tip is in the region of the distal superior vena cava/right atrium.  No evidence of pneumothorax.  Elevated right hemidiaphragm similar to that of prior exam.  Right base subsegmental atelectasis.  Mild central pulmonary vascular congestion.  No obvious segmental infiltrate although evaluation of bases is limited. 
 IMPRESSION
 Cardiomegaly with central pulmonary vascular prominence. 
 Elevated right hemidiaphragm. 
 Right base subsegmental atelectasis.

## 2004-11-24 IMAGING — CT CT ABDOMEN W/ CM
1 of 2 series · 15 of 32 positions shown, 19 images · IV contrast (agent unspecified)
Comparison: None.

CLINICAL DATA: Abdominal pain, fever, constipation. 
CT ABDOMEN AND PELVIS WITH CONTRAST, 11/17/03, 4866 HOURS

[Series 2: routine abdomen · axial · 0.80mm/px · z∈[-410,-15]mm · 15 of 116 slices shown, 19 images]
[im 8/116  soft-tissue]
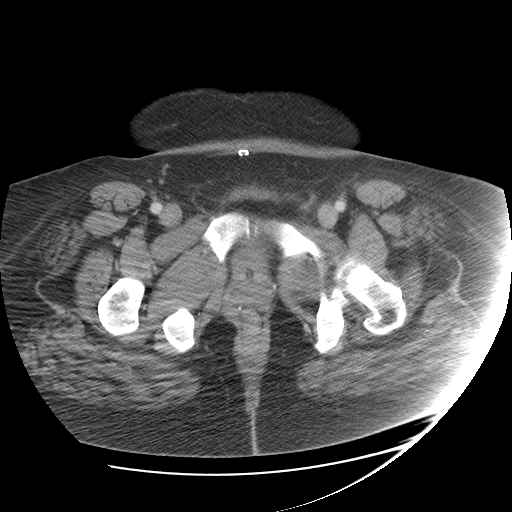
[im 8/116  bone]
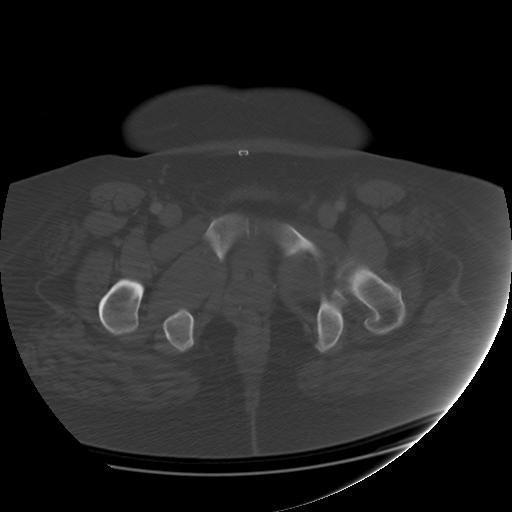
[im 16/116  soft-tissue]
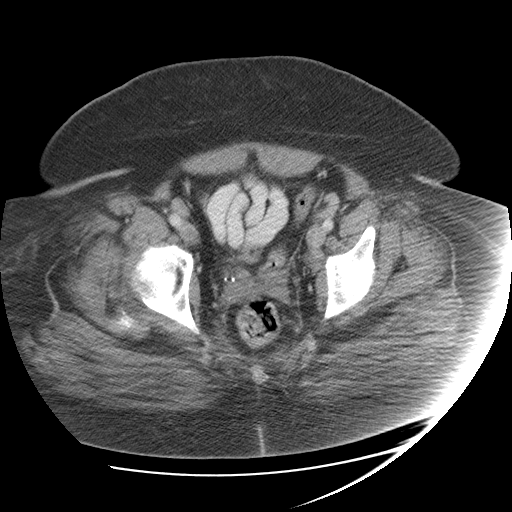
[im 24/116  soft-tissue]
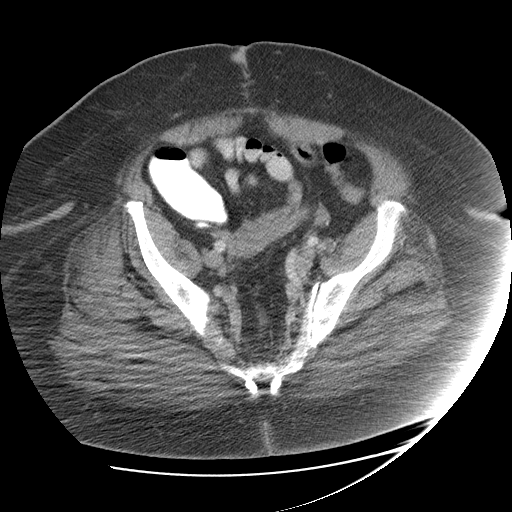
[im 31/116  soft-tissue]
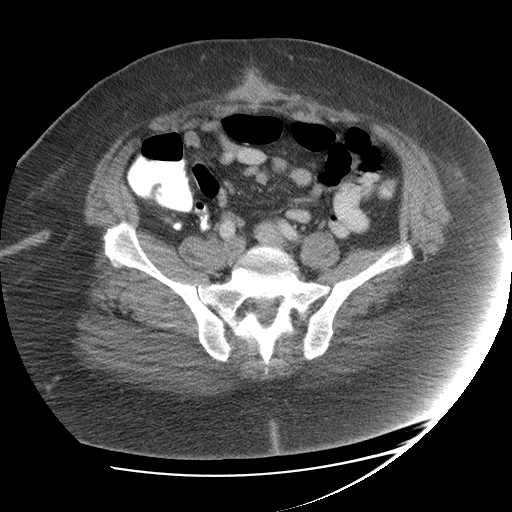
[im 43/116  soft-tissue]
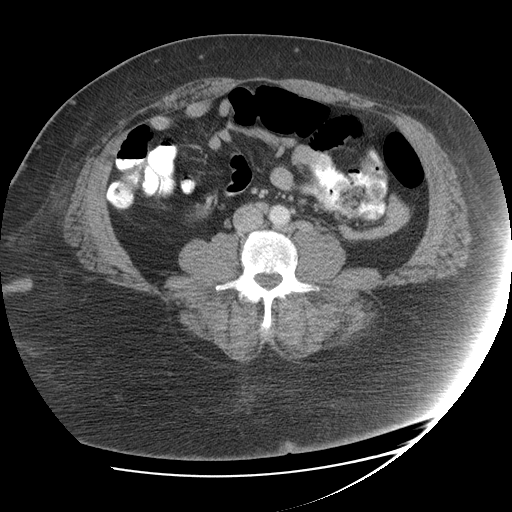
[im 50/116  soft-tissue]
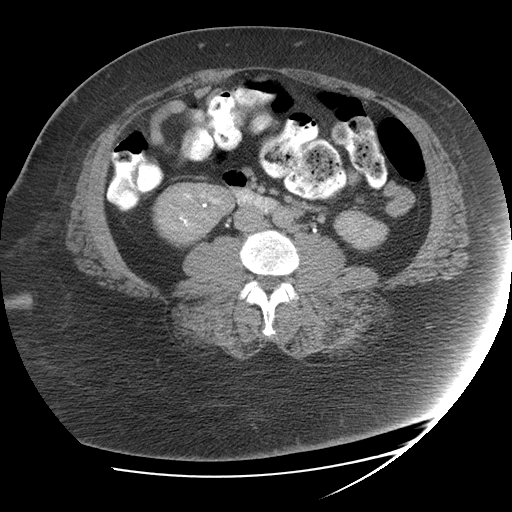
[im 58/116  soft-tissue]
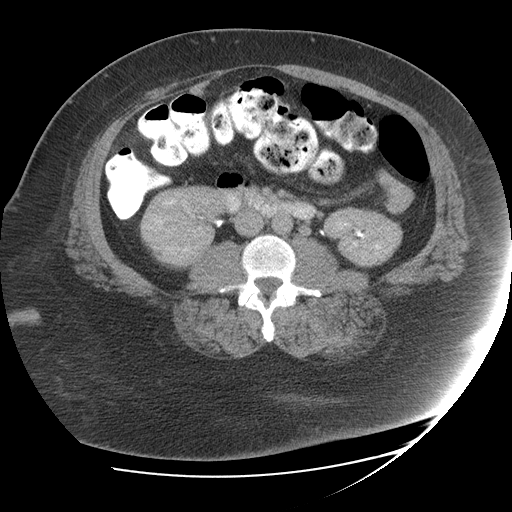
[im 66/116  soft-tissue]
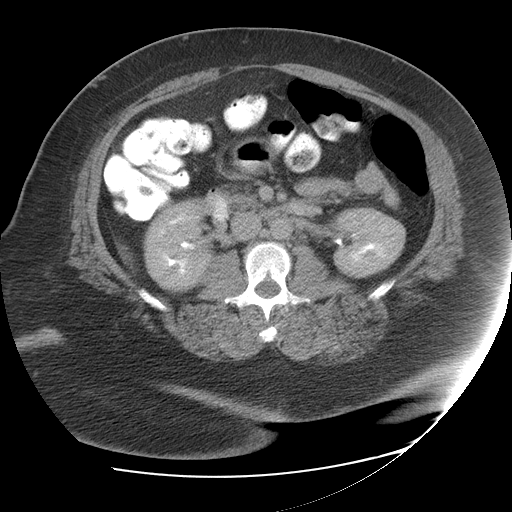
[im 73/116  soft-tissue]
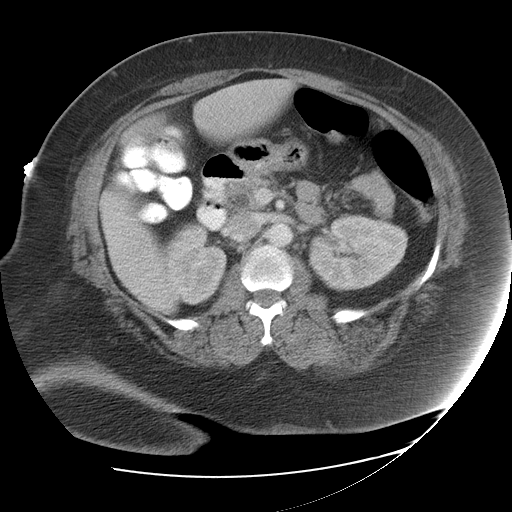
[im 73/116  bone]
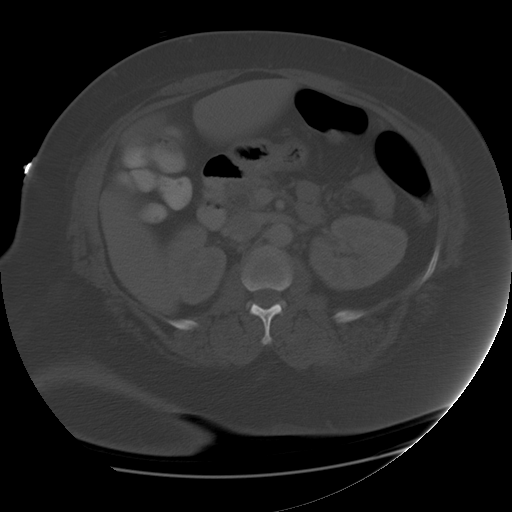
[im 85/116  soft-tissue]
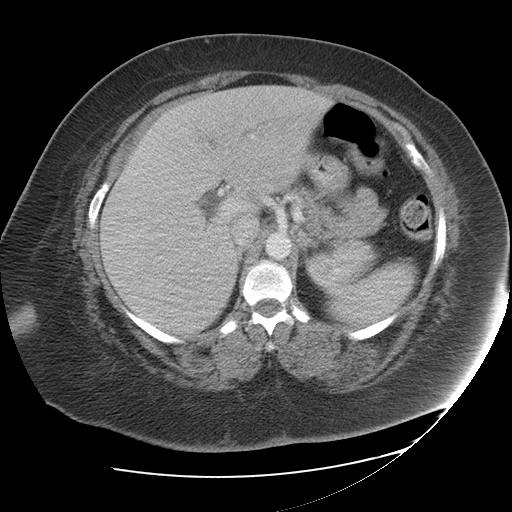
[im 93/116  soft-tissue]
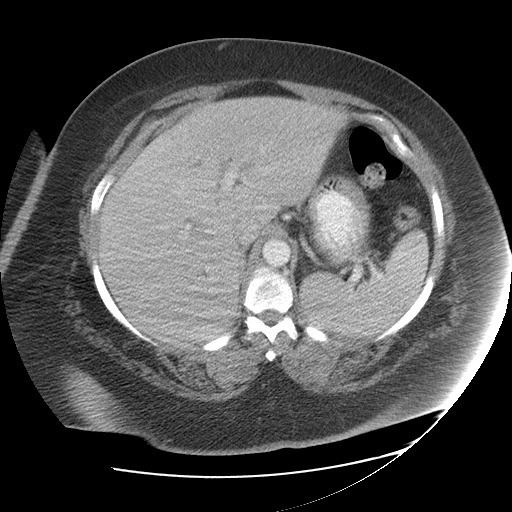
[im 100/116  soft-tissue]
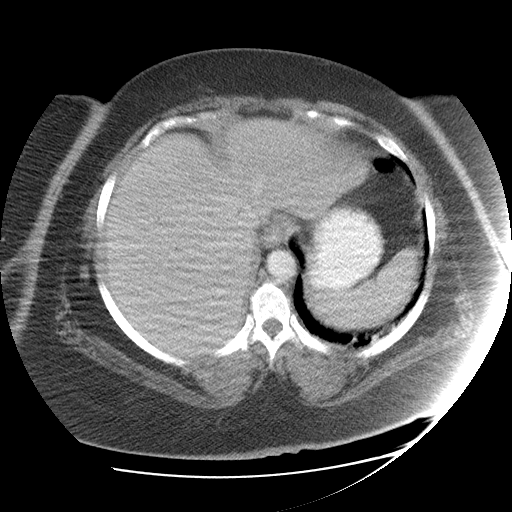
[im 100/116  lung]
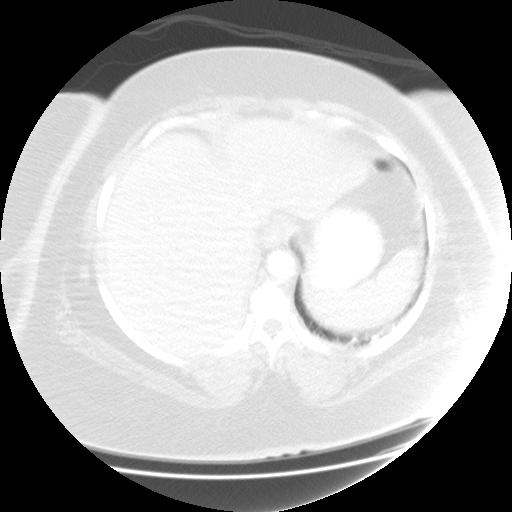
[im 104/116  lung]
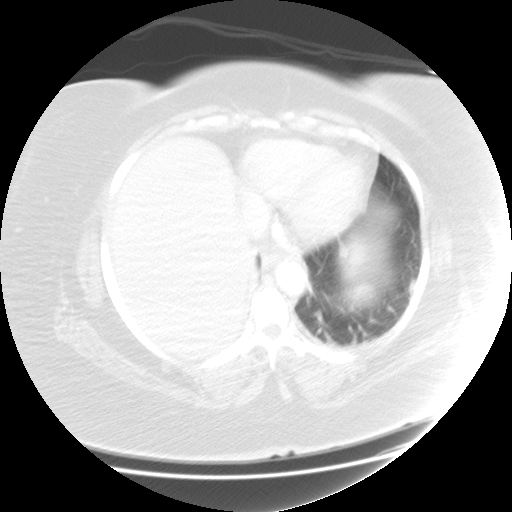
[im 108/116  soft-tissue]
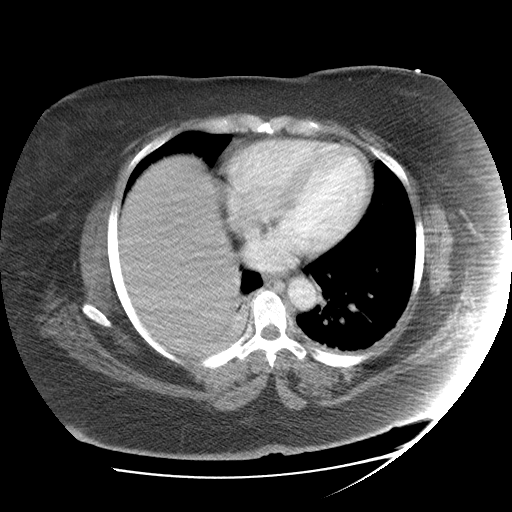
[im 108/116  lung]
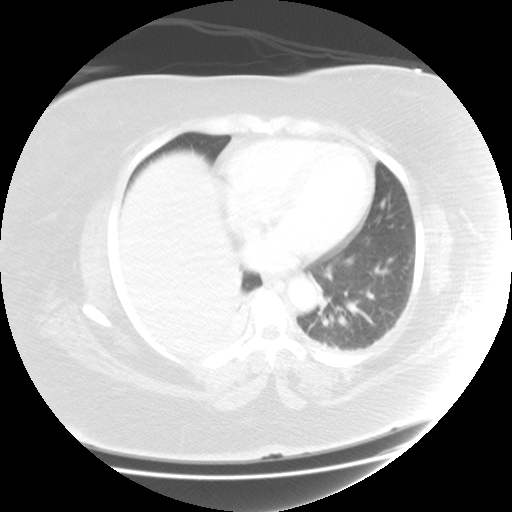
[im 112/116  lung]
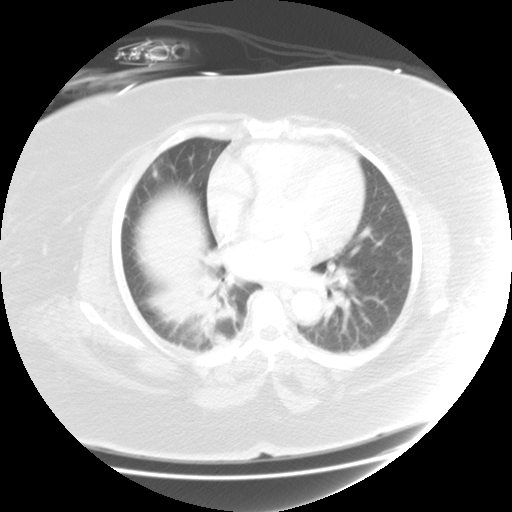

[15 of 32 positions shown; findings below may reference images not displayed]

FINDINGS: After the intravenous injection of 150 cc Omnipaque 300, 5 mm spiral images were obtained through the abdomen and pelvis. 
CT ABDOMEN
The patient is obese.  A 6 mm patchy density is seen in the right middle lobe on image 41.  Atelectasis versus airspace disease is noted at the right base posteriorly.  
Streak artifact limits the study.  The patient is status-post cholecystectomy.  The biliary system is prominent likely due to cholecystectomy.  The spleen, kidneys, adrenal glands, and pancreatic body and tail are within normal limits.  The pancreatic head is atrophic.  On image 44 there is a 2.7 x 1.3 cm cystic structure anterior to the horizontal portion of the duodenum that may simply represent a duodenal diverticulum.  The appendix is normal.  At the midline of the lower abdomen there is a 5 x 2.5 cm soft tissue area at the convergence of the anterior abdominal musculature.  There is no evidence of focal abscess.  Negative free fluid.  Negative abnormal adenopathy.  
IMPRESSION
Right basilar atelectasis.
Postsurgical change. 
Duodenal diverticulum is suspected.  
Soft tissue density at the midline at the anterior abdominal musculature.  This may represent scar.  If the patient has history of cancer, metastatic disease is not excluded.  
CT PELVIS
In the pelvis the bladder is decompressed by a Foley catheter.  A tiny amount of free fluid is present and is nonspecific.  Negative abnormal adenopathy.  
IMPRESSION
Tiny amount of free fluid.

## 2004-11-24 IMAGING — CR DG CHEST 1V PORT
1 series · 1 of 1 positions shown · non-contrast
Comparison: 11/16/03.

CLINICAL DATA: Attempted central line placement on the right.  Left port-a-catheter removal.
 PORTABLE CHEST RADIOGRAPH ? 11/17/03

[view not recorded]
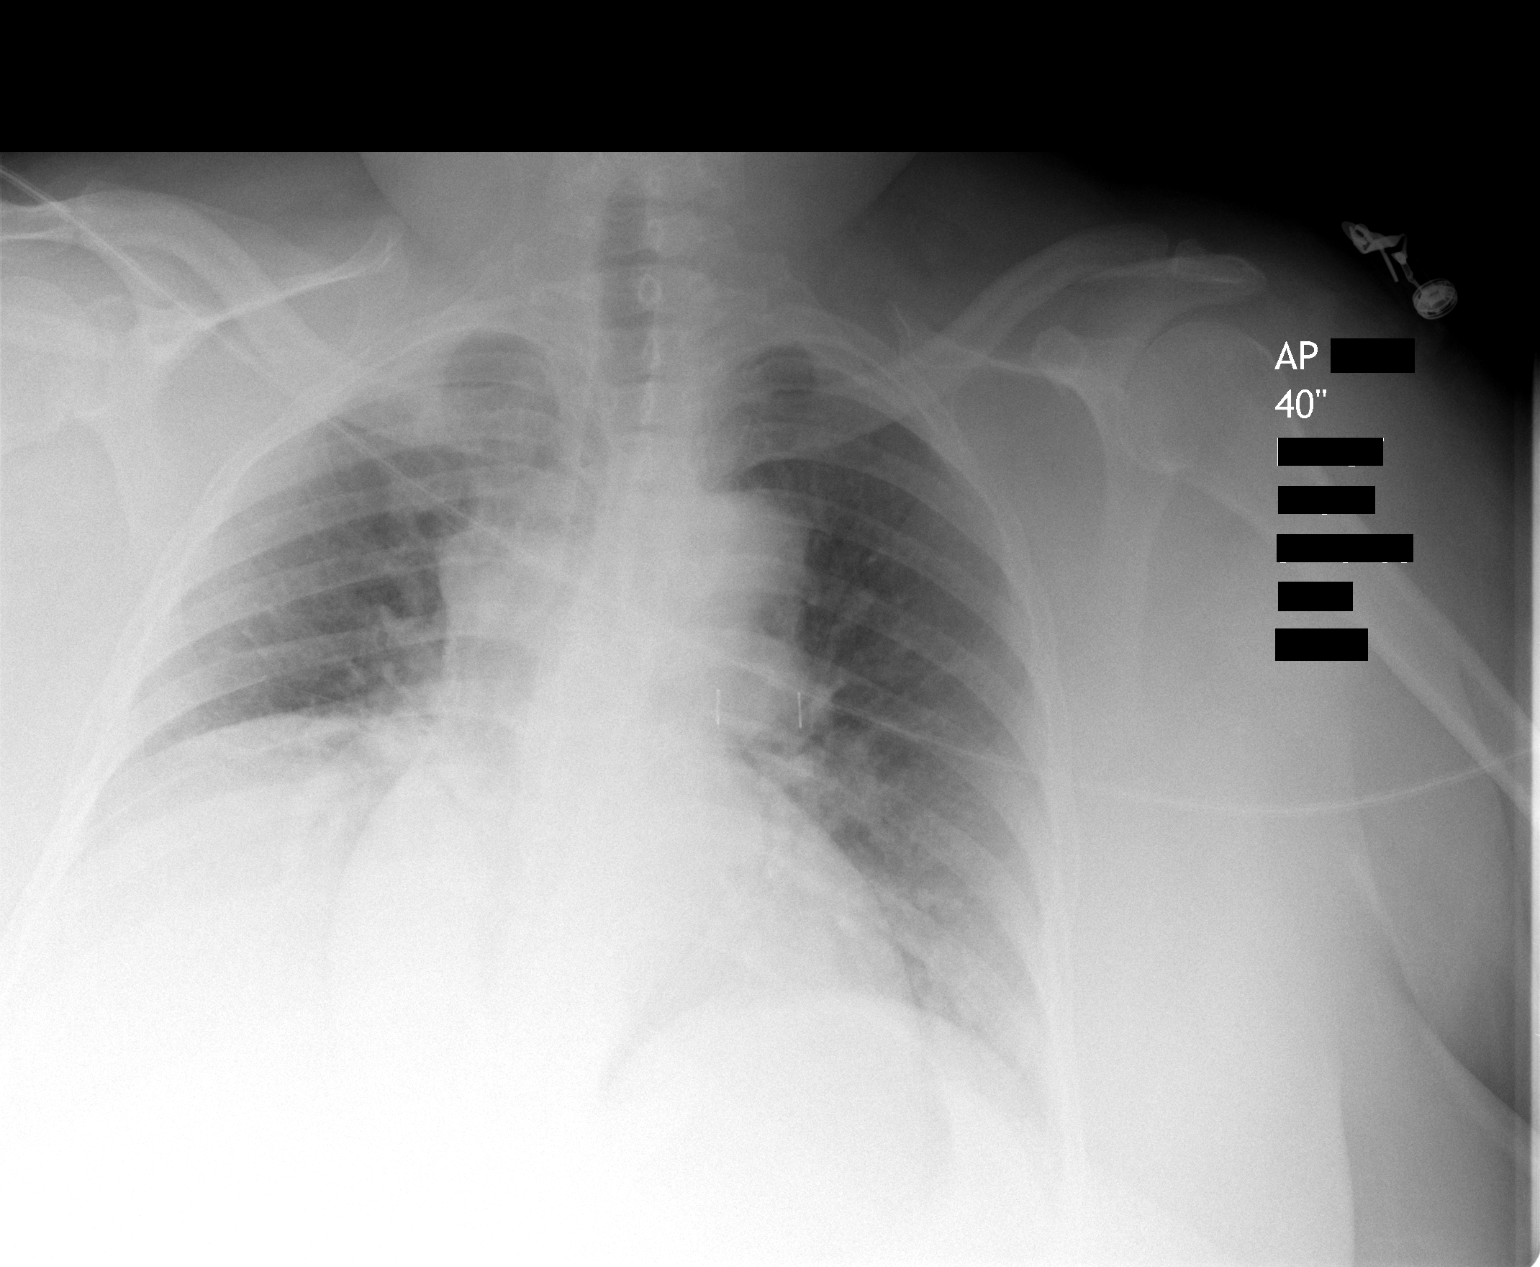

[1 of 1 positions shown; findings below may reference images not displayed]

FINDINGS: Lung volumes are decreased with perihilar and lower lobe atelectasis, right greater than left.  There is associated elevation of the right hemidiaphragm.  No visible pneumothorax by plain radiograph.  Two staples overlie the left hilum related to port-a-catheter removal.
 IMPRESSION
 1.  Low volume examination with right greater than left atelectasis.
 2.  Removal of left port-a-catheter.
 3.  No definite pneumothorax.

## 2004-11-26 IMAGING — CR DG CHEST 1V PORT
1 series · 1 of 1 positions shown · non-contrast
Comparison: 11/17/03.

CLINICAL DATA: Fever, abdominal pain and unresponsive.  
 PORTABLE SINGLE VIEW CHEST RADIOGRAPH, 11/19/03, 4456 HOURS

[view not recorded]
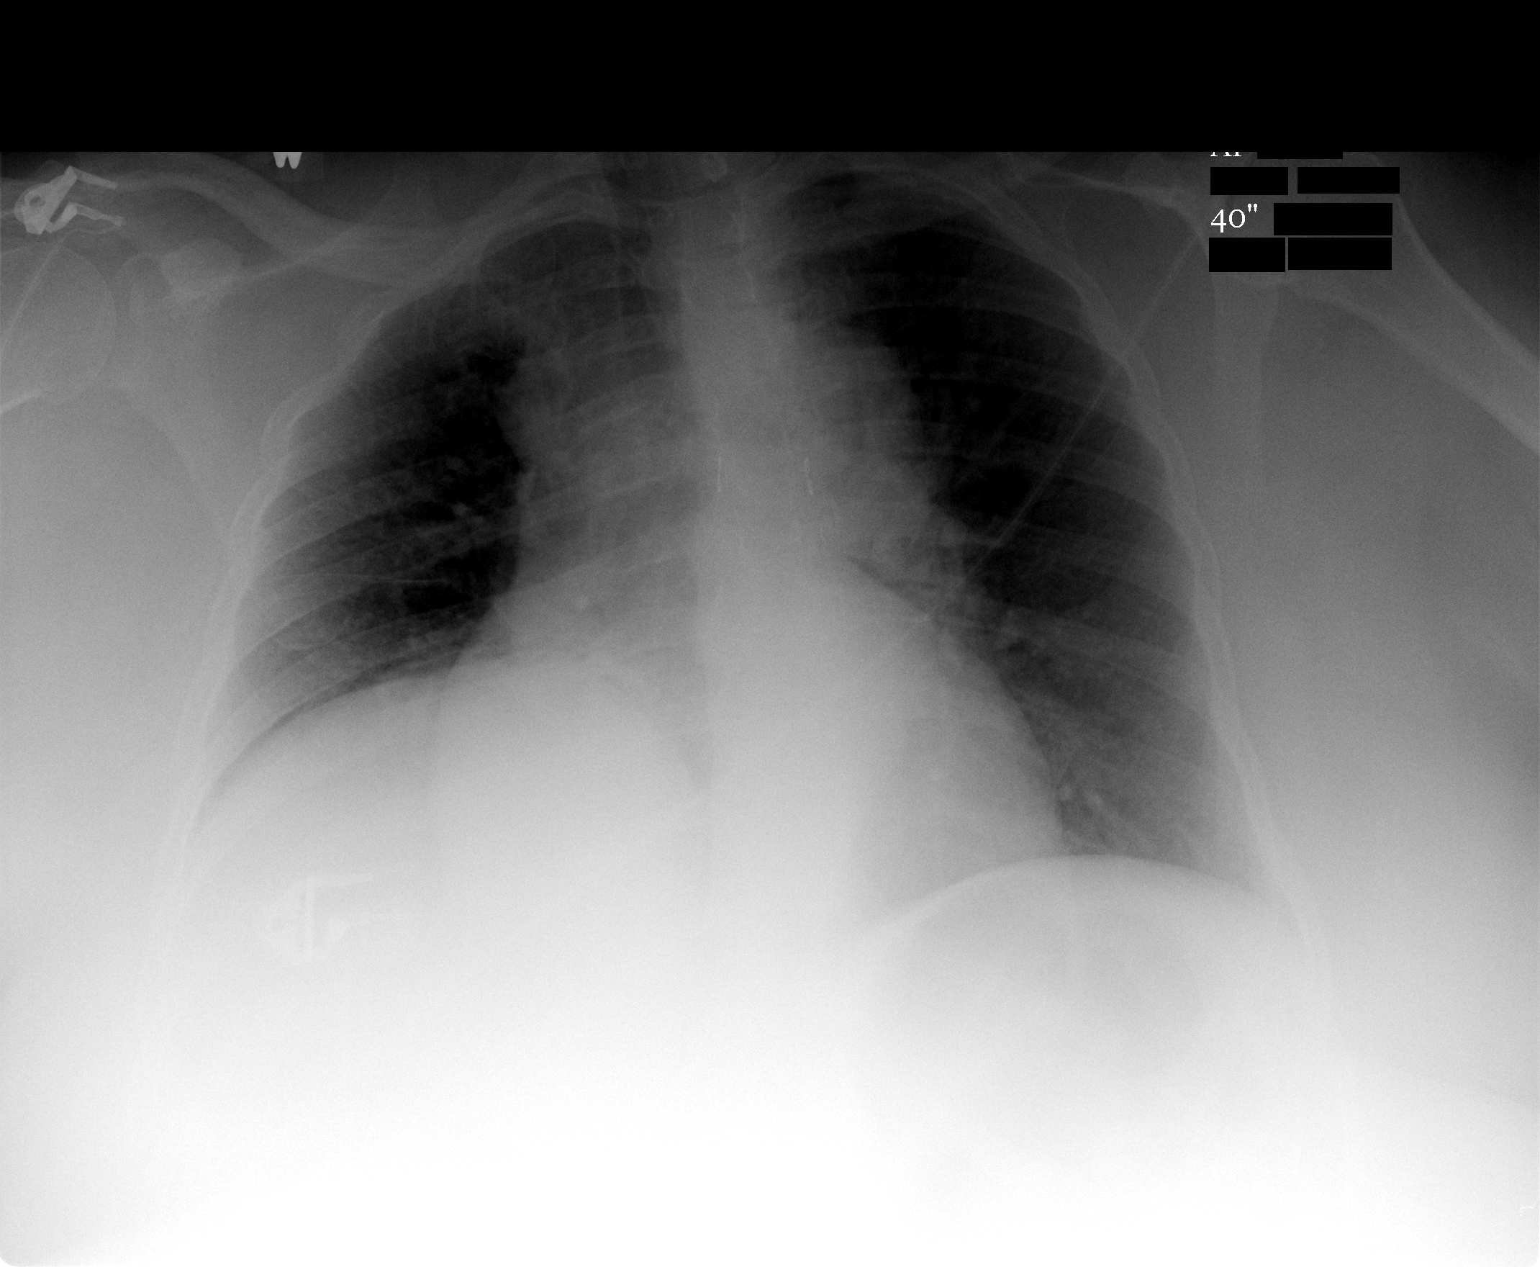

[1 of 1 positions shown; findings below may reference images not displayed]

FINDINGS: Two staples project over the mediastinum related to the port-a-catheter removal.  Exam is rotated to the right.  This accentuates the cardiac and mediastinal contours.  Overall the exam is stable with elevation of the right hemidiaphragm and right greater than left basilar atelectasis.  No pneumothorax.  
 IMPRESSION
 Stable exam with low volumes and resolving atelectasis.  
 No pneumothorax.  
 Left port-a-catheter removal.

## 2004-11-26 IMAGING — CR DG CHEST 1V PORT
1 series · 1 of 1 positions shown · non-contrast
Comparison: 11/19/03, [DATE] hours.

CLINICAL DATA: 53-year-old, fever, abdominal pain. 
 PORTABLE CHEST RADIOGRAPH SINGLE VIEW, 11/19/03, 0071 HOURS

[view not recorded]
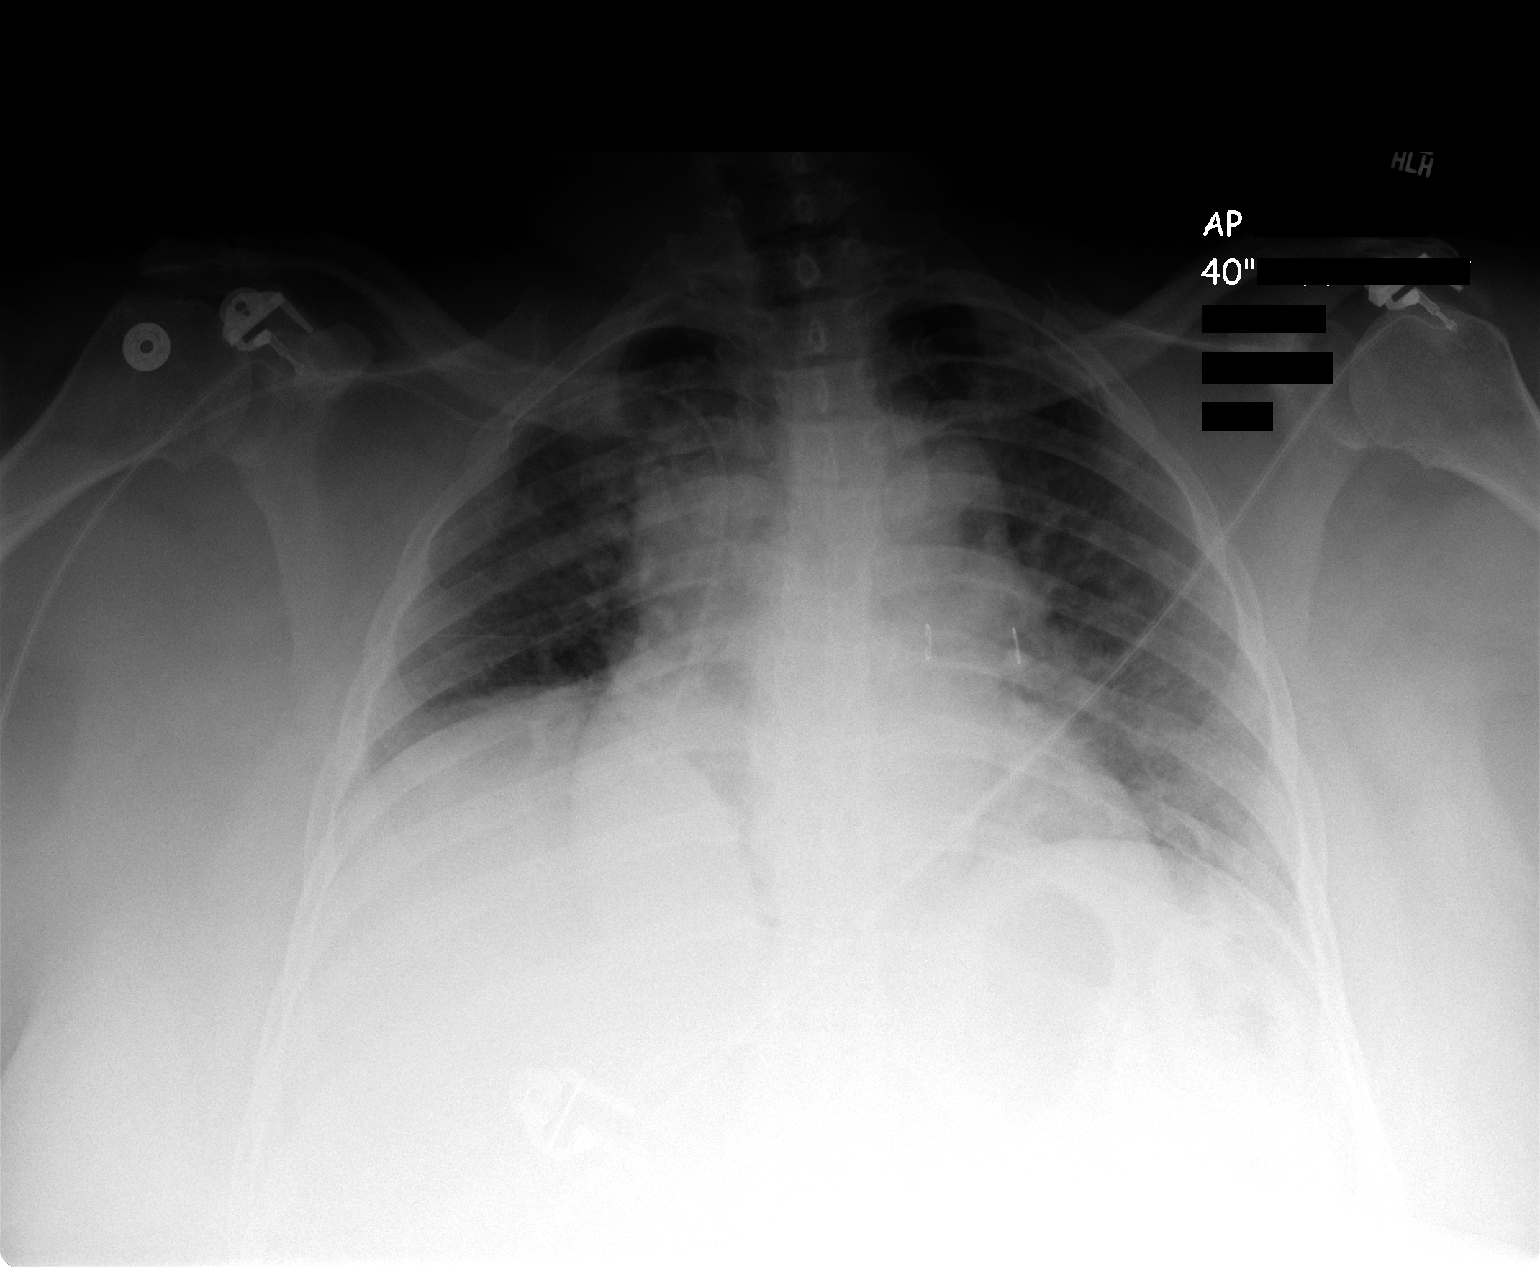

[1 of 1 positions shown; findings below may reference images not displayed]

FINDINGS: Right PICC line has been inserted with the tip at the cavoatrial junction.  The lungs volumes are decreased.  There is stable cardiomegaly with slight increased vascular congestion.  Clips are present over the left hilum from prior port removal.  No pneumothorax.  
 Bibasilar atelectasis has also increased.  
 IMPRESSION
 Right upper extremity PICC line with the tip at the SVC RA junction. 
 Cardiac enlargement, prominent mediastinum, and low volume exam. 
 Bibasilar atelectasis and vascular congestion.

## 2004-12-01 ENCOUNTER — Ambulatory Visit (HOSPITAL_BASED_OUTPATIENT_CLINIC_OR_DEPARTMENT_OTHER): Admission: RE | Admit: 2004-12-01 | Discharge: 2004-12-01 | Payer: Self-pay | Admitting: Unknown Physician Specialty

## 2004-12-05 ENCOUNTER — Ambulatory Visit: Payer: Self-pay | Admitting: Internal Medicine

## 2004-12-09 ENCOUNTER — Ambulatory Visit (HOSPITAL_COMMUNITY): Admission: RE | Admit: 2004-12-09 | Discharge: 2004-12-09 | Payer: Self-pay | Admitting: Unknown Physician Specialty

## 2004-12-18 IMAGING — CR DG CHEST 2V
2 series · 2 of 2 positions shown · non-contrast
Comparison: 11/19/03.

CLINICAL DATA: Shortness of breath and cough.  Right calf cellulitis. 
 TWO VIEW CHEST

[view not recorded (1 of 2)]
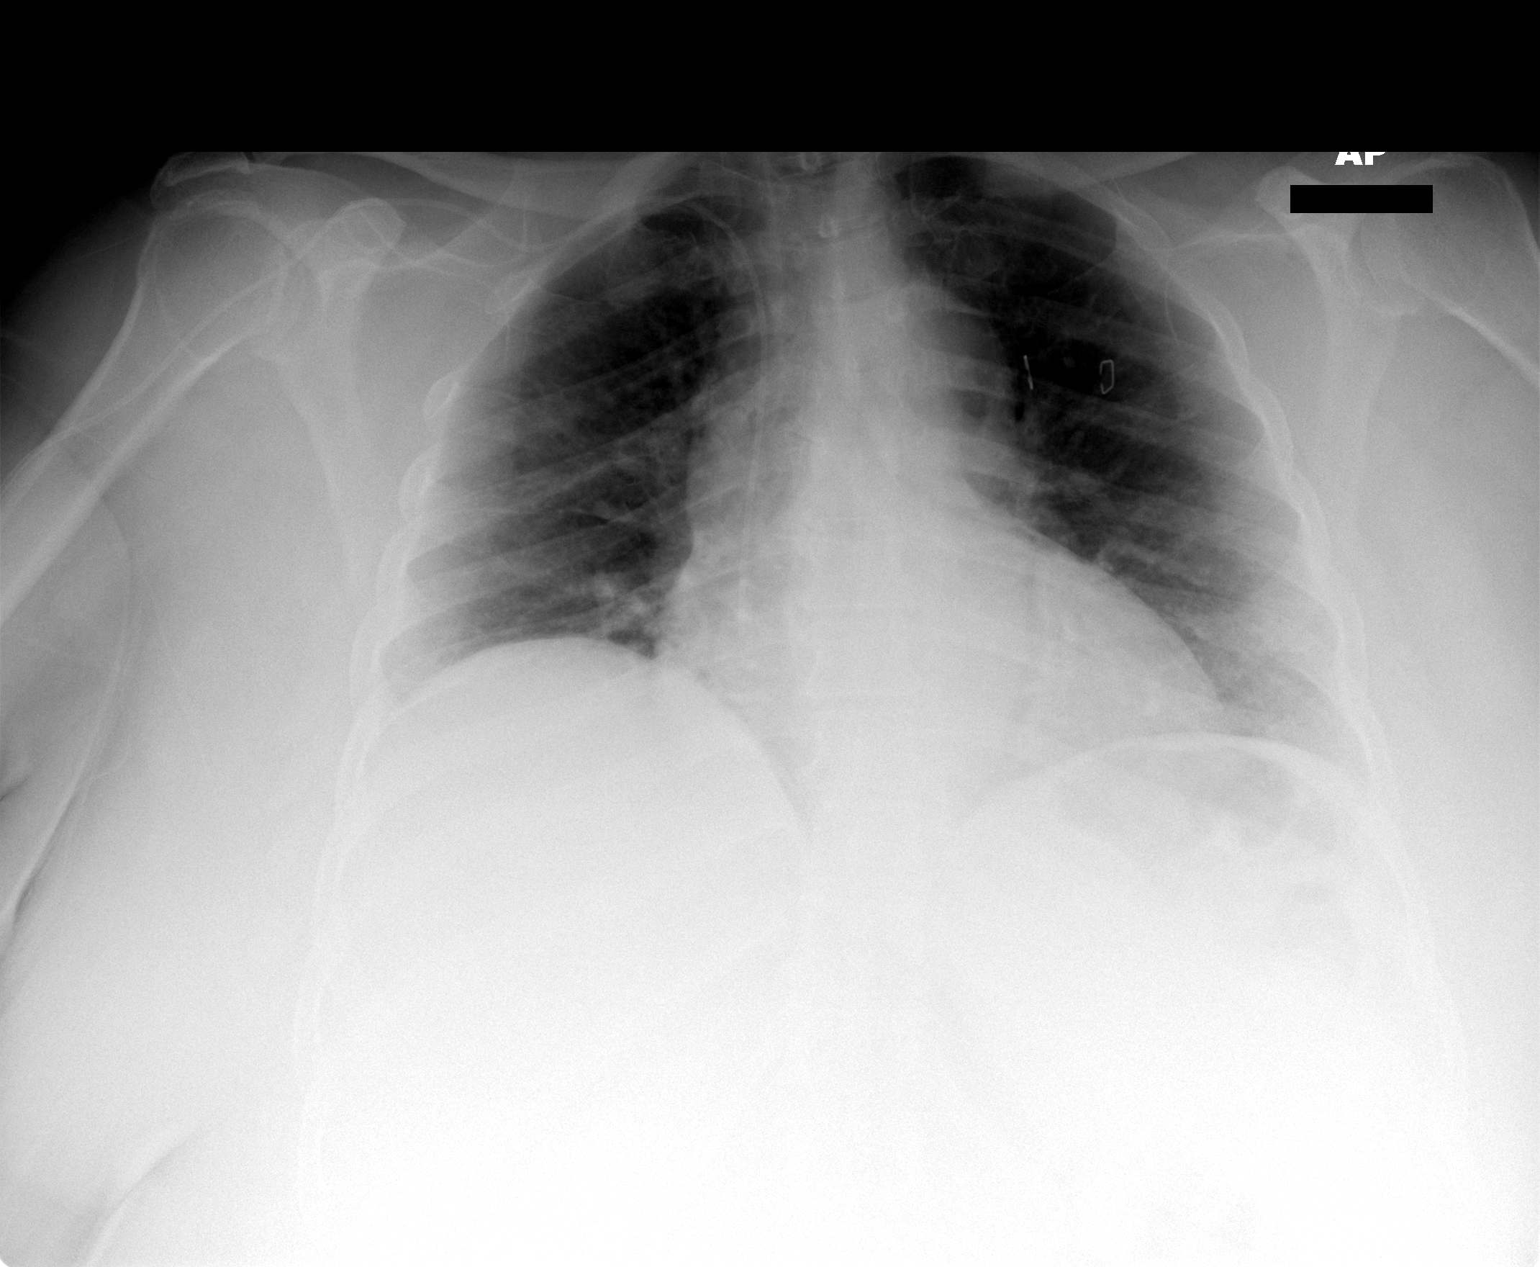

[view not recorded (2 of 2)]
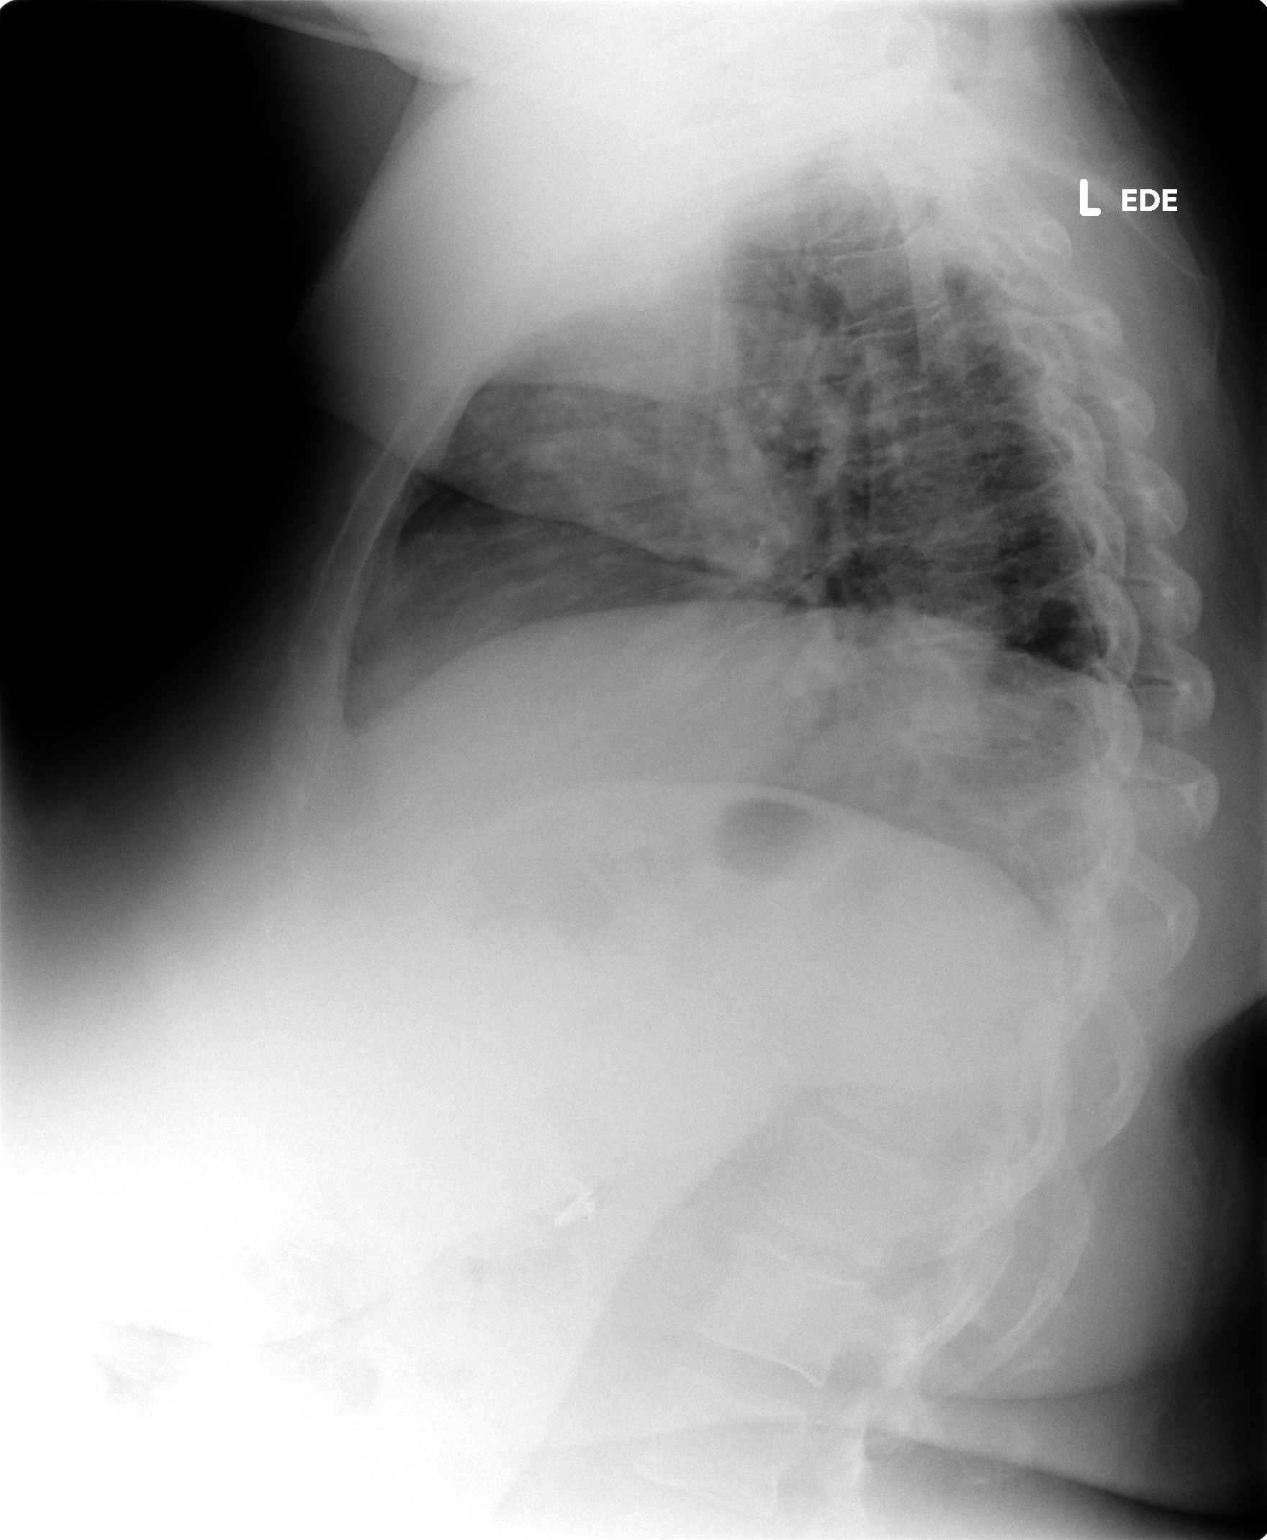

[2 of 2 positions shown; findings below may reference images not displayed]

Low lung volumes are again noted but increased since prior study.  Both lungs are clear and there is no evidence of pleural effusion.  Heart size is within normal limits allowing for low lung volumes and there is no evidence of congestive heart failure.  
 Right arm PICC line remains in place with tip in the proximal right atrium.  Surgical staples are again noted in the left chest wall. 
 IMPRESSION
 Low lung volumes.  No acute disease.

## 2004-12-23 IMAGING — CR DG CHEST 2V
2 series · 2 of 2 positions shown · non-contrast
Comparison: none

CLINICAL DATA: Cough, shortness of breath.
 CHEST (TWO VIEWS)
 Comparison 12/11/03.  
 Right arm PICC line stable in position.  There is some linear atelectasis in the right middle lobe which is new since previous exam.  Elevation of the right diaphragmatic leaflet is slightly  more pronounced.  Heart size upper limits normal.  No effusion.  
 IMPRESSION
 New right middle lobe atelectasis.

[view not recorded (1 of 2)]
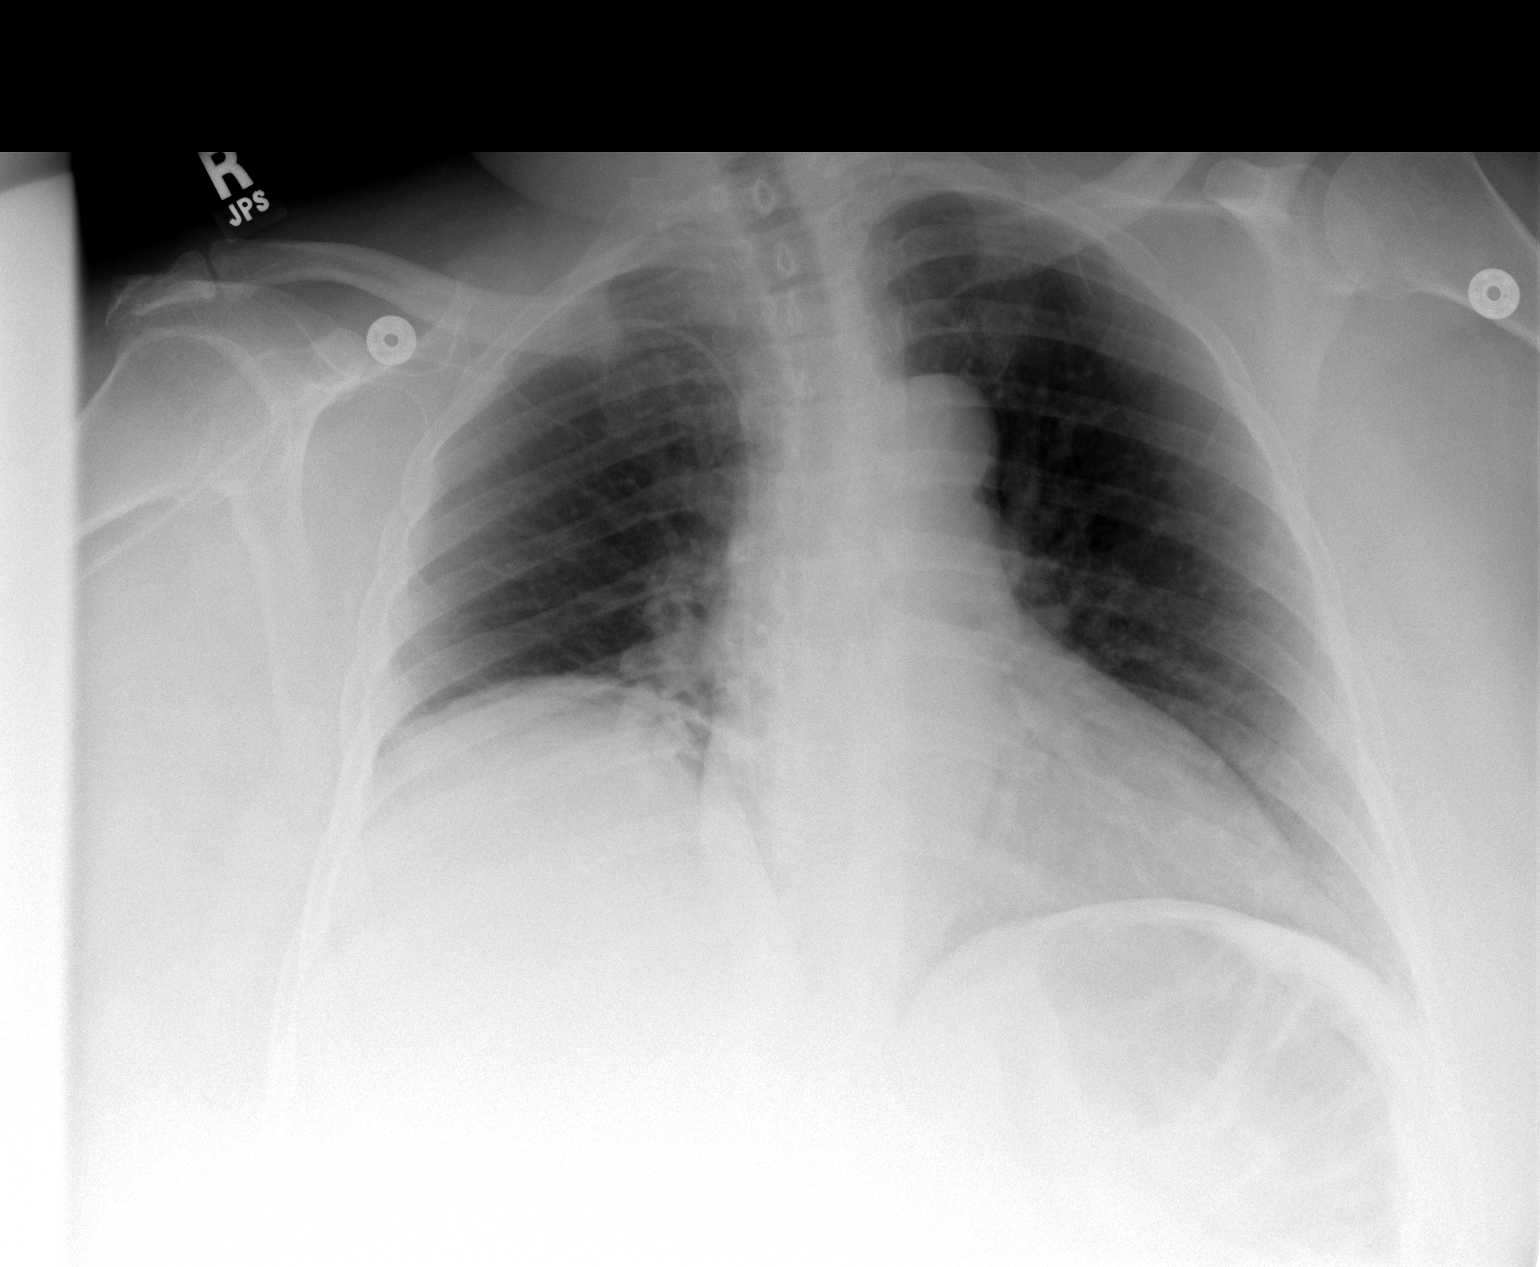

[view not recorded (2 of 2)]
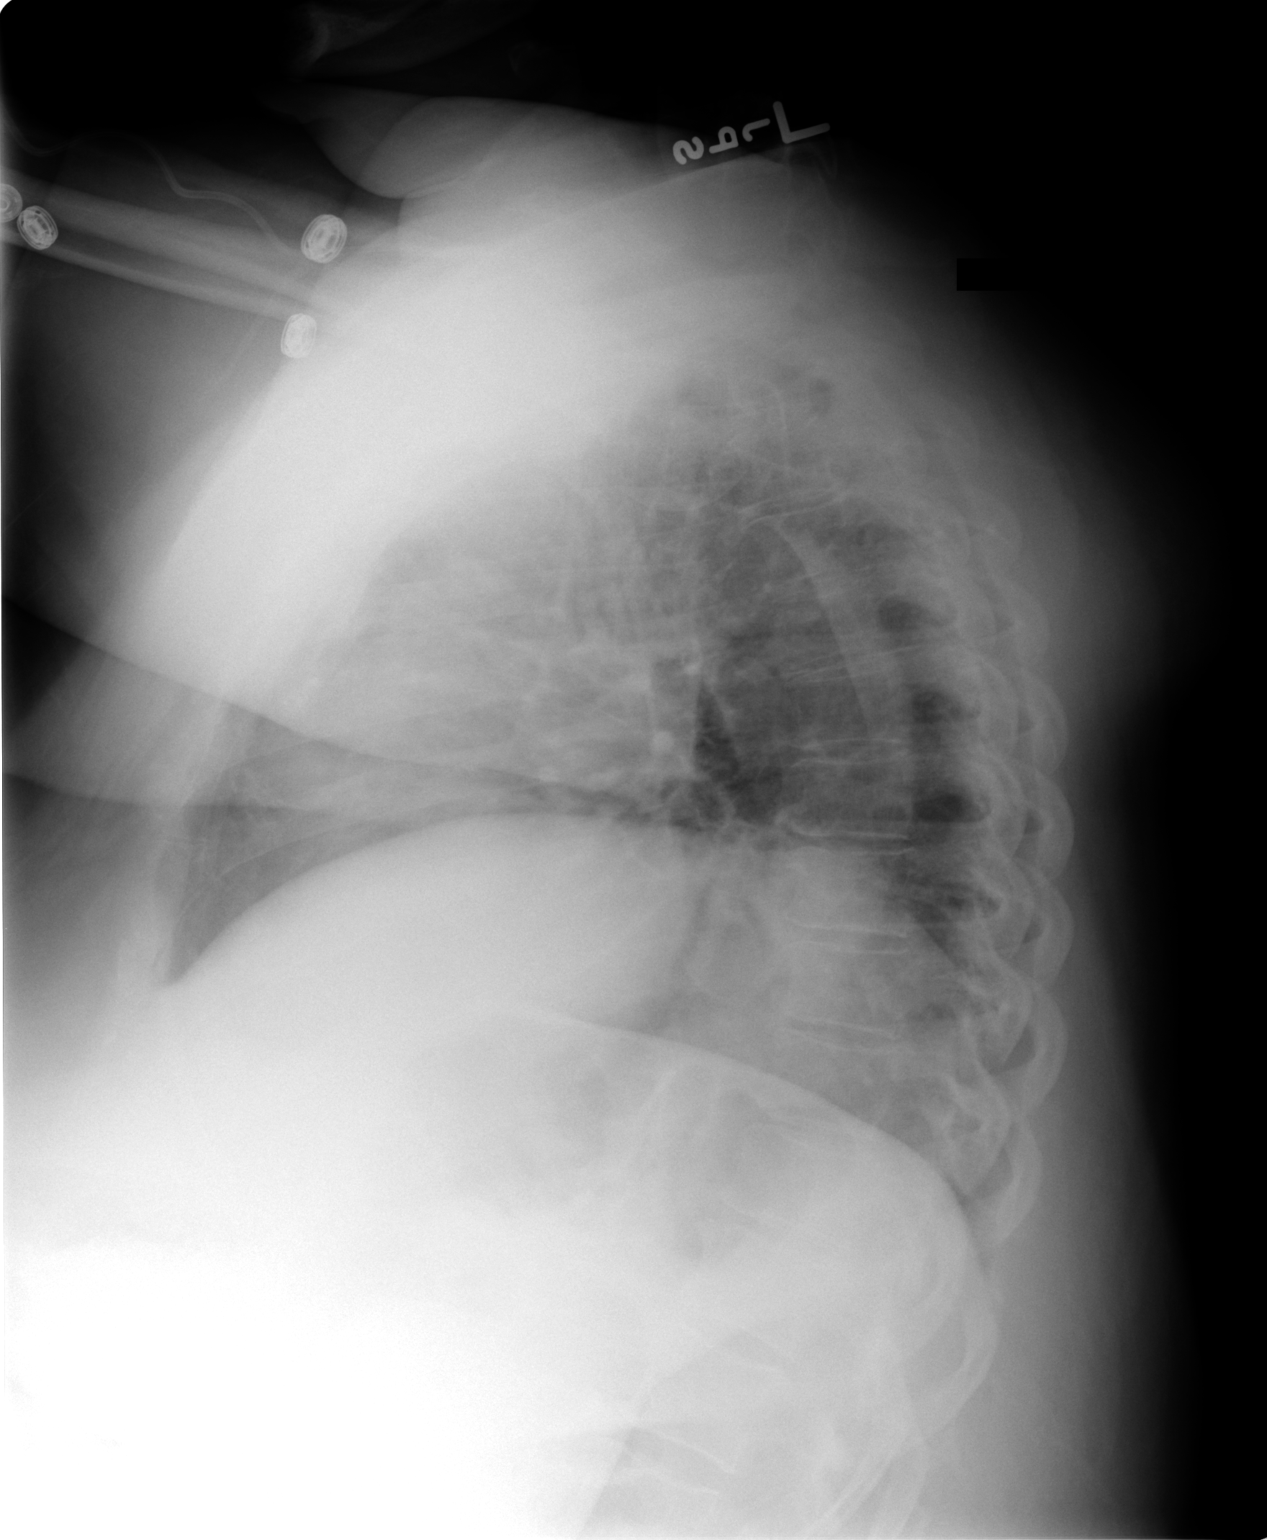

[2 of 2 positions shown; findings below may reference images not displayed]

## 2005-02-25 IMAGING — CR DG CHEST 1V PORT
1 series · 1 of 1 positions shown · non-contrast
Comparison: 12/16/03.

CLINICAL DATA: Chest pain, shortness of breath.
 PORTABLE CHEST, ONE VIEW ? 02/18/04 (9593)

[view not recorded]
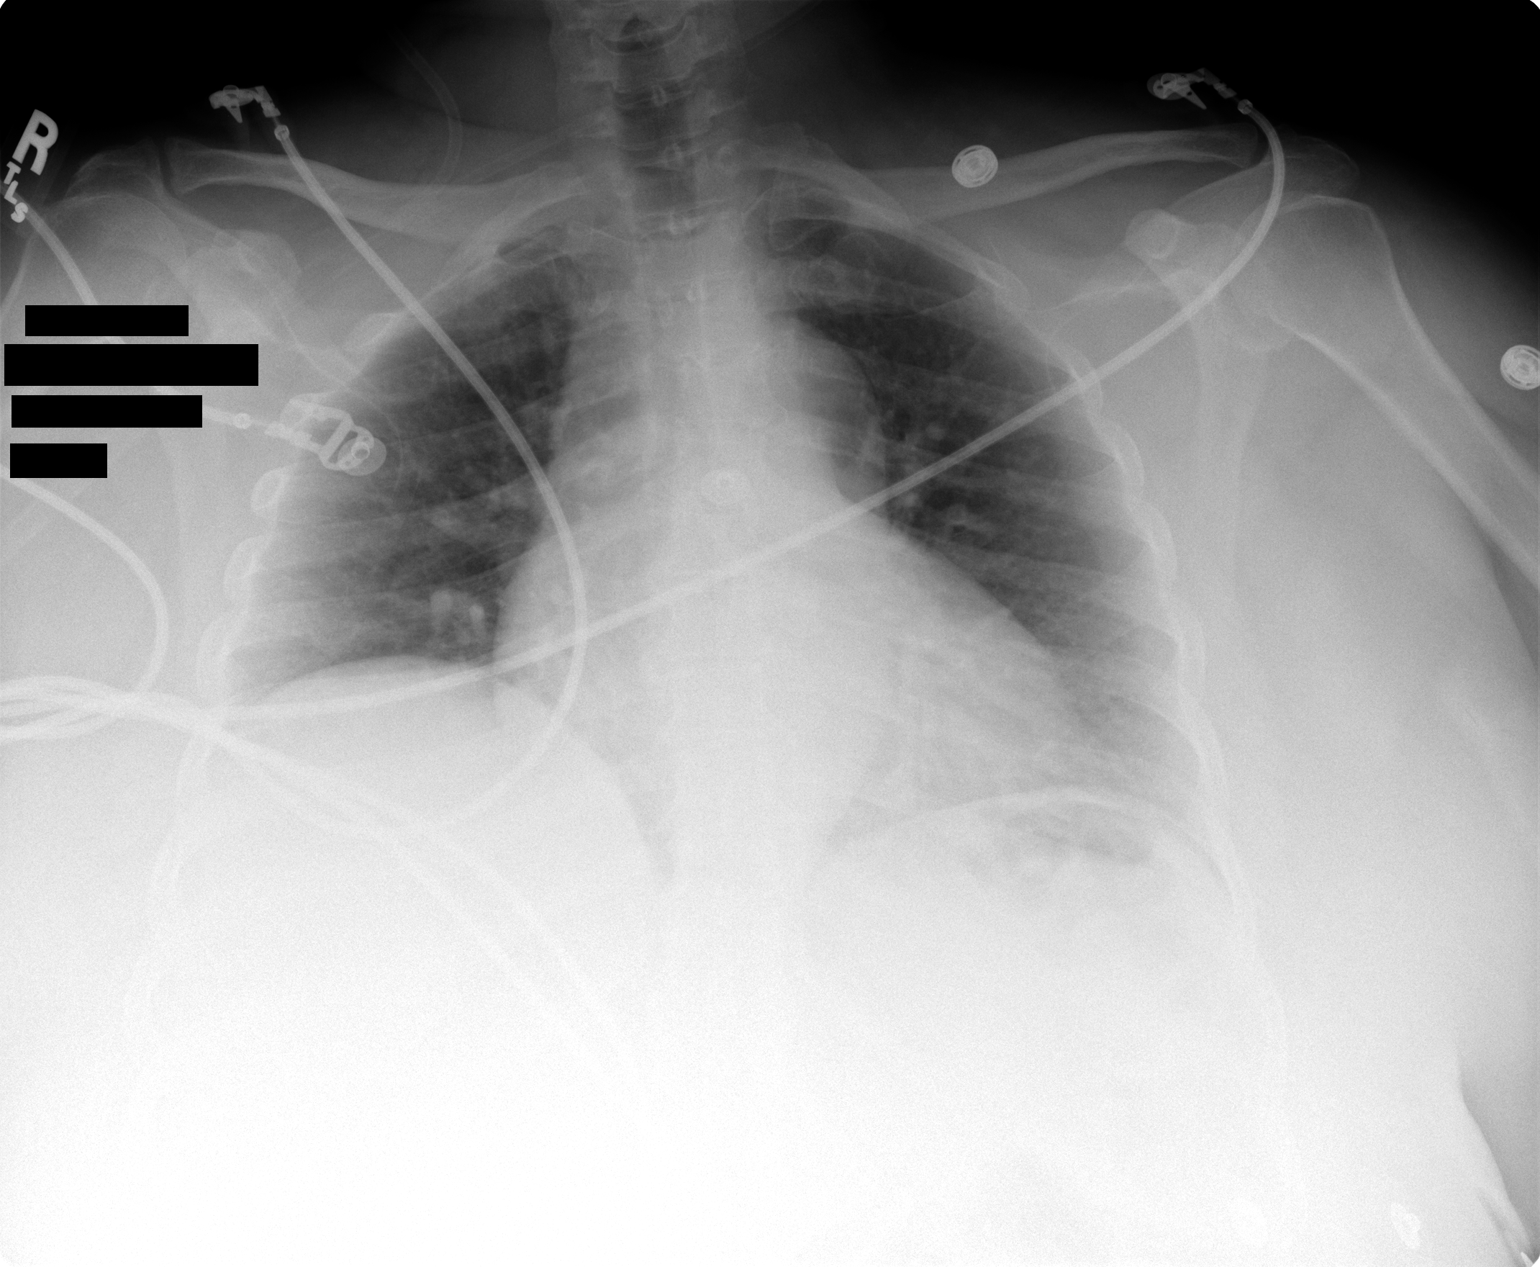

[1 of 1 positions shown; findings below may reference images not displayed]

There are low lung volumes with vascular crowding and mild bibasilar atelectasis. The heart is mildly enlarged.  The visualized skeleton unremarkable.  
 IMPRESSION 
 Mild cardiomegaly.  
 Low volumes with bibasilar atelectasis.

## 2005-02-26 IMAGING — XA IR CV CATH FLUORO GUIDE
1 series · 2 of 2 positions shown · non-contrast
Comparison: none

CLINICAL DATA: Lower extremity cellulitis.  IV antibiotics required.
 PICC PLACEMENT WITH ULTRASOUND GUIDANCE
TECHNIQUE: The right arm was prepped with Betadine, draped in the usual sterile fashion, and infiltrated locally with 1% lidocaine.  Ultrasound demonstrated patency of the basilic vein.  Under real-time ultrasound guidance, this vein was accessed with a 21 gauge micropuncture needle.  The needle was exchanged over a guidewire for a peel-away sheath, through which a 5 French single lumen PICC catheter trimmed to 43 cm was advanced, positioned with its tip at the distal SVC/right atrial junction.  Fluoroscopic spot chest radiograph confirms appropriate catheter tip position.  The catheter was flushed, secured to the skin with Prolene sutures, and covered with a sterile dressing.  No immediate complication. 
 Fluoroscopy time:  0.3 minutes.
 IMPRESSION
 Technically successful right arm PICC placement with ultrasound.  Ready for routine use.

[Series 1: run · 2 of 2 slices shown]
[im 1/2]
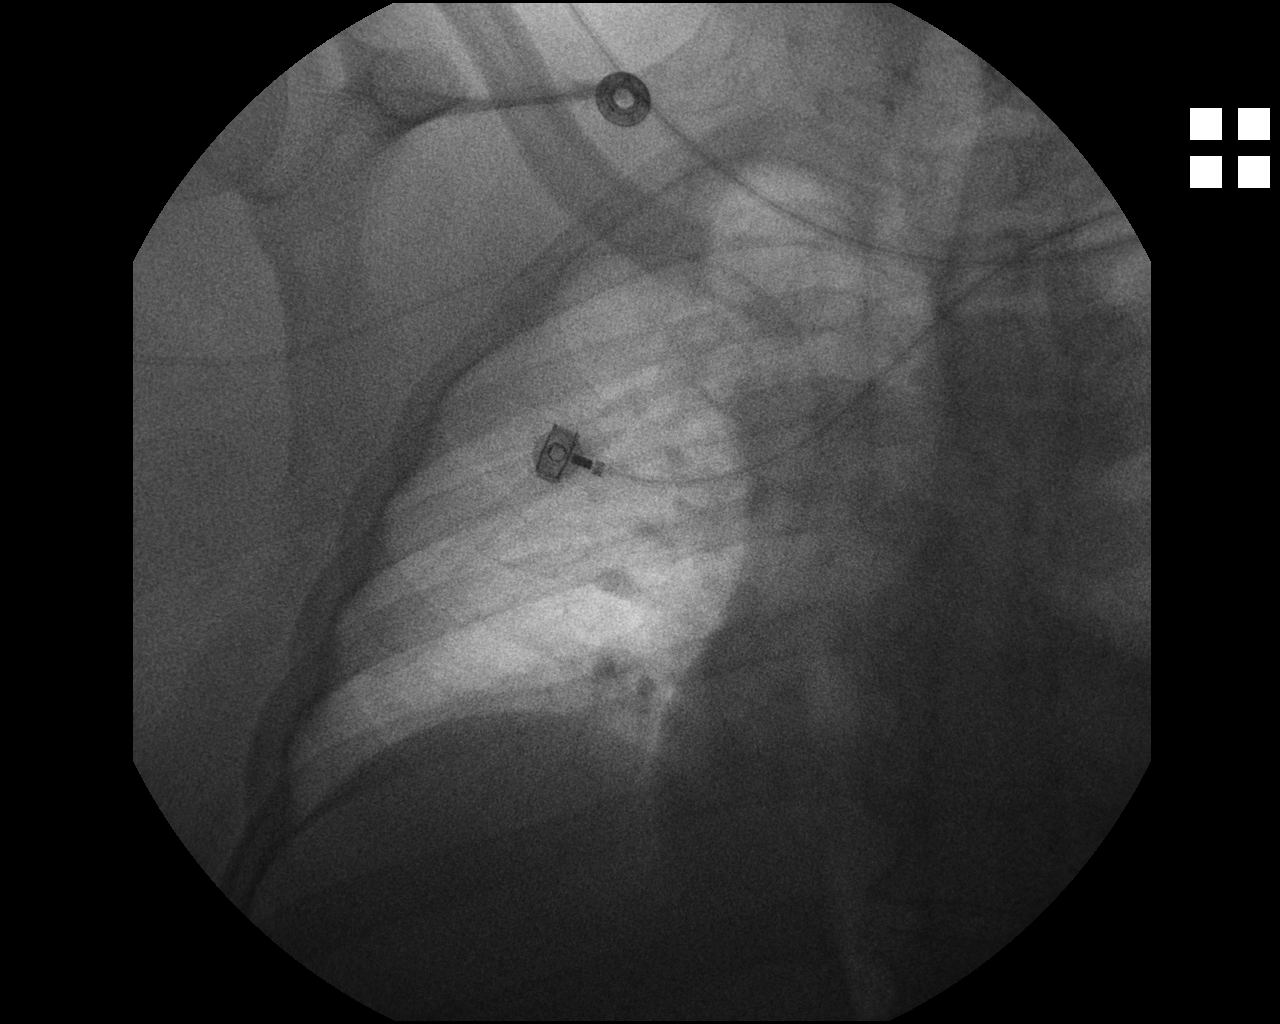
[im 2/2]
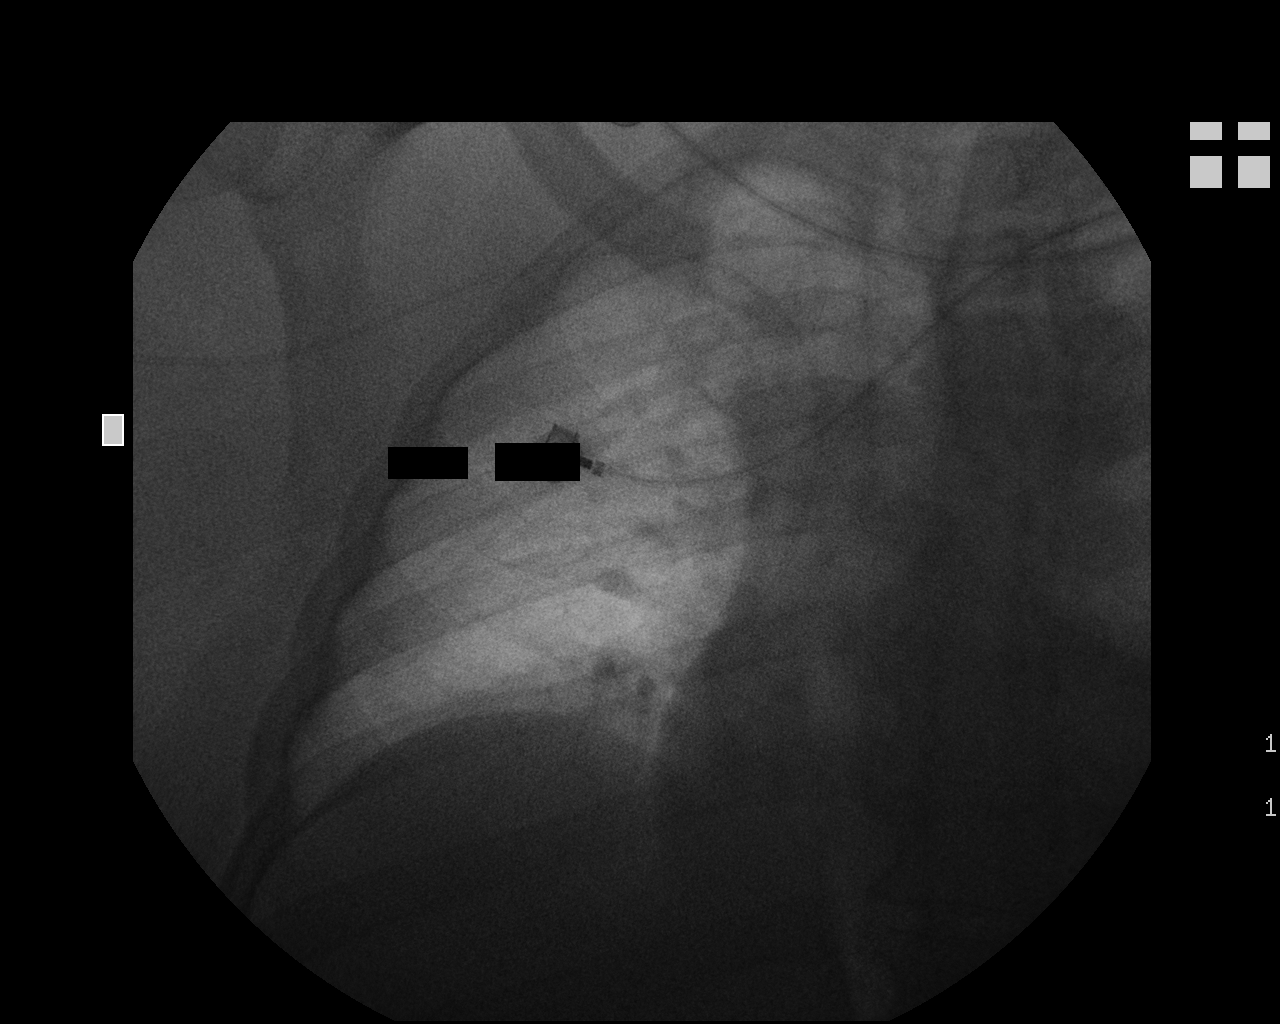

[2 of 2 positions shown; findings below may reference images not displayed]

## 2005-05-18 IMAGING — CR DG CHEST 2V
2 series · 2 of 2 positions shown · non-contrast
Comparison: 02/18/04.

CLINICAL DATA: Fever.
 CHEST ? TWO VIEWS 05/10/04:

[view not recorded (1 of 2)]
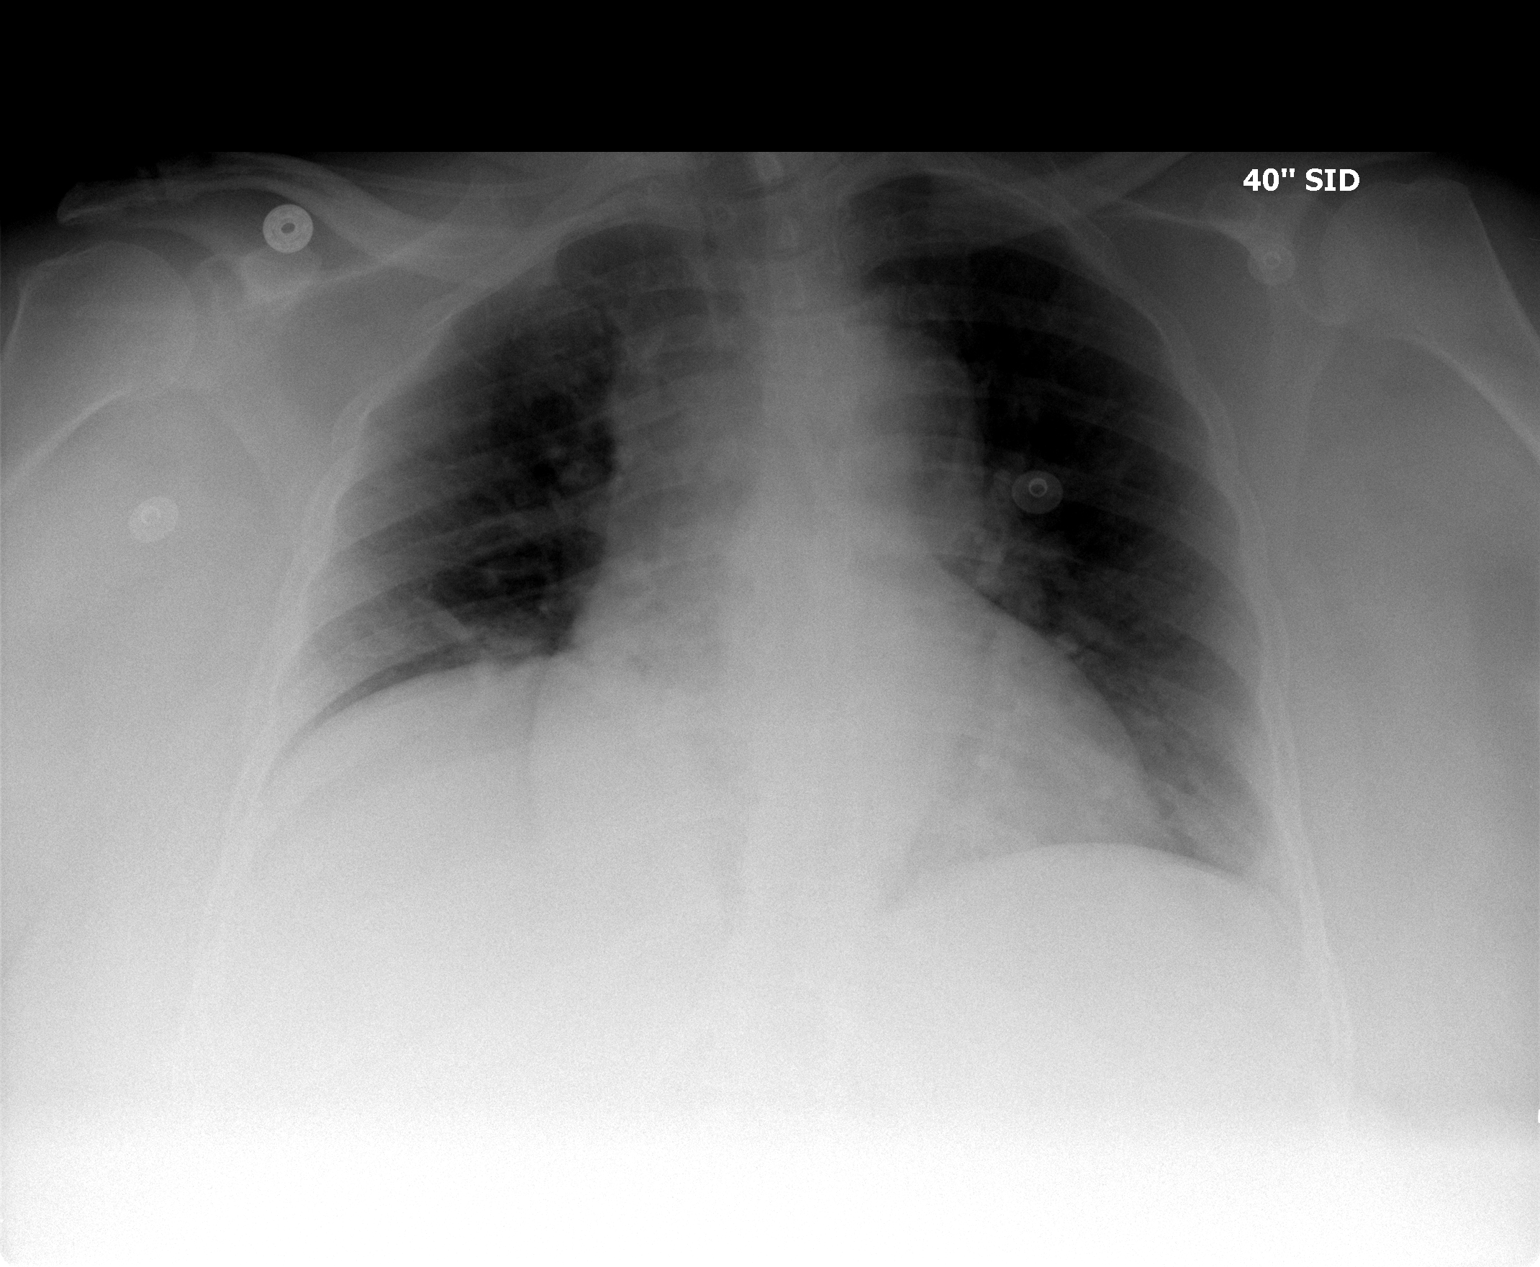

[view not recorded (2 of 2)]
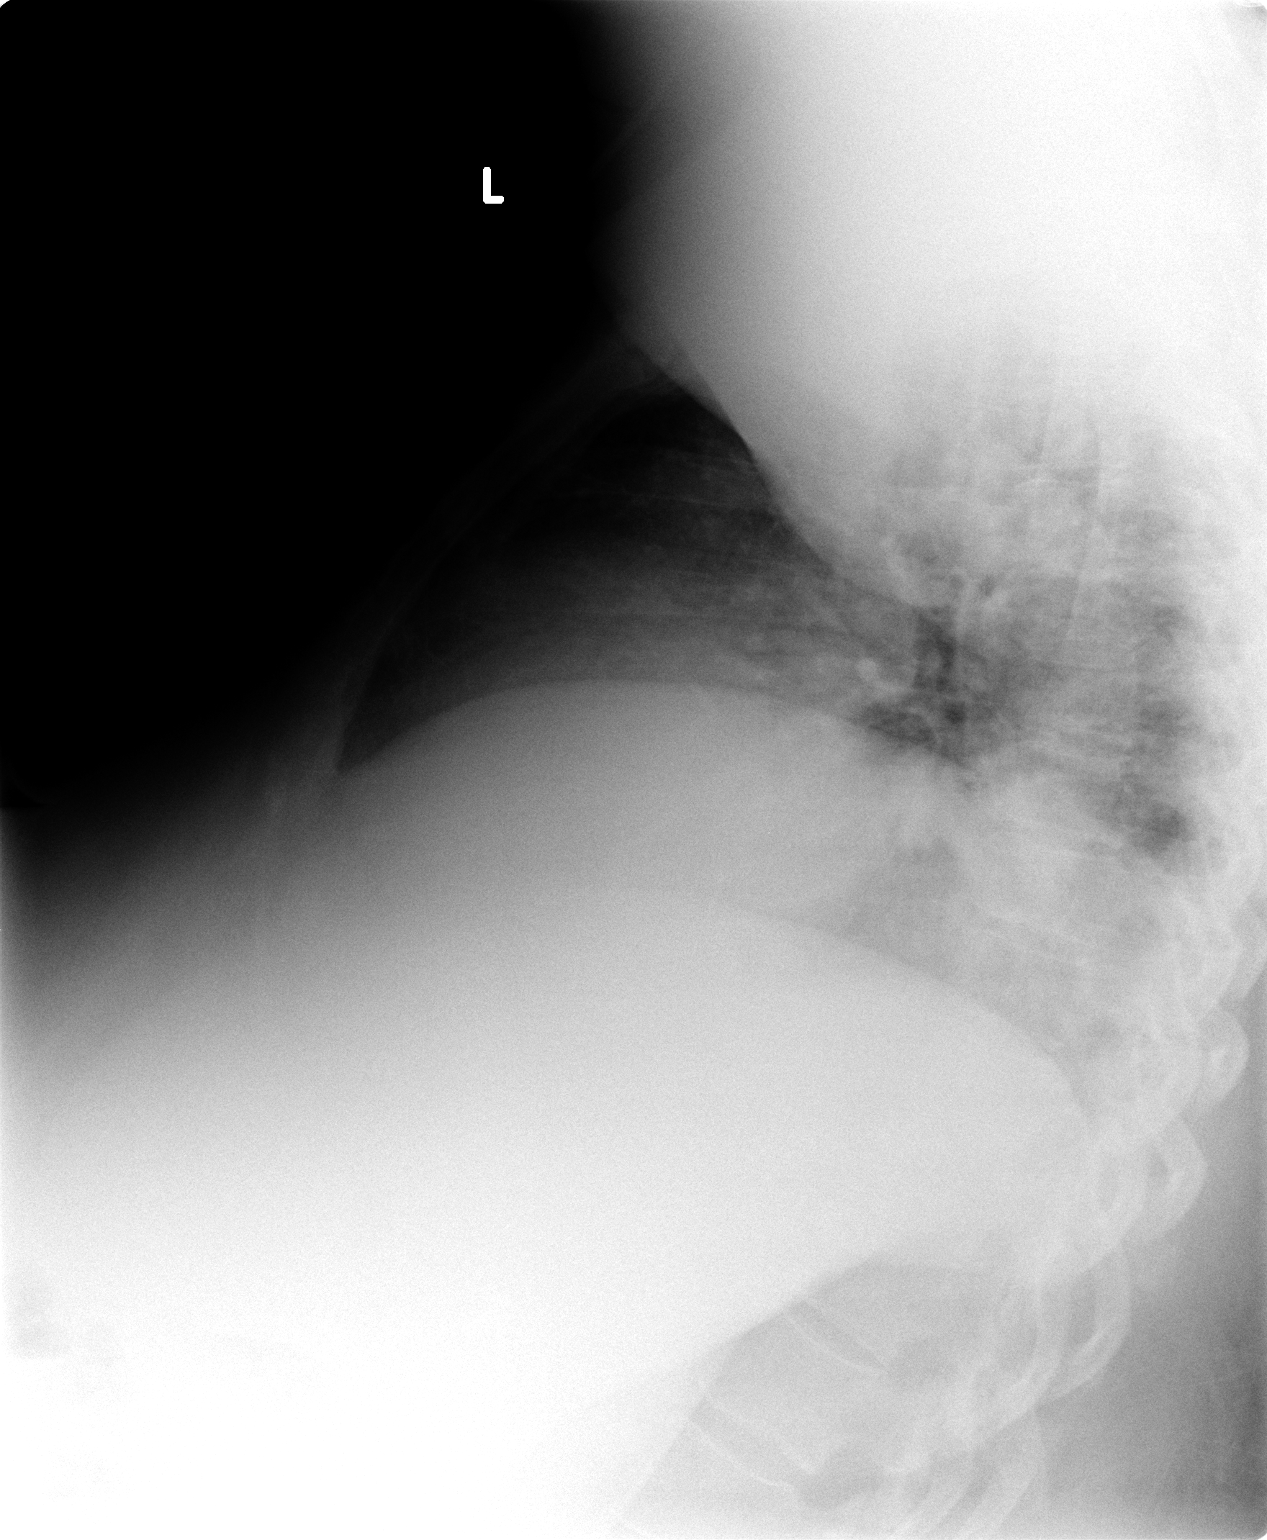

[2 of 2 positions shown; findings below may reference images not displayed]

There is chronic elevation of the right hemidiaphragm.  Mild bibasilar atelectasis present.  No overt infiltrate or edema.  No evidence of pleural fluid.  Stable heart size.
IMPRESSION: Low lung volumes with bibasilar atelectasis.

## 2005-06-29 ENCOUNTER — Ambulatory Visit (HOSPITAL_COMMUNITY): Admission: RE | Admit: 2005-06-29 | Discharge: 2005-06-29 | Payer: Self-pay | Admitting: Unknown Physician Specialty

## 2005-07-17 ENCOUNTER — Inpatient Hospital Stay (HOSPITAL_COMMUNITY): Admission: EM | Admit: 2005-07-17 | Discharge: 2005-07-22 | Payer: Self-pay | Admitting: Emergency Medicine

## 2005-10-01 IMAGING — CR DG CHEST 1V PORT
1 series · 1 of 1 positions shown · non-contrast
Comparison: none

CLINICAL DATA: Midchest pain with shortness of breath. Weakness.  Nausea and vomiting. 
 PORTABLE CHEST SINGLE VIEW:
 An AP semierect portable film of the chest made 09/23/04 at [DATE] hours shows suggestion of mild edema and cardiomegaly.  There is also noted stable elevation of the right hemidiaphragm and some bilateral basilar atelectasis more marked on the right than the left.   There is no evidence of pneumothorax or pleural effusion.

[view not recorded]
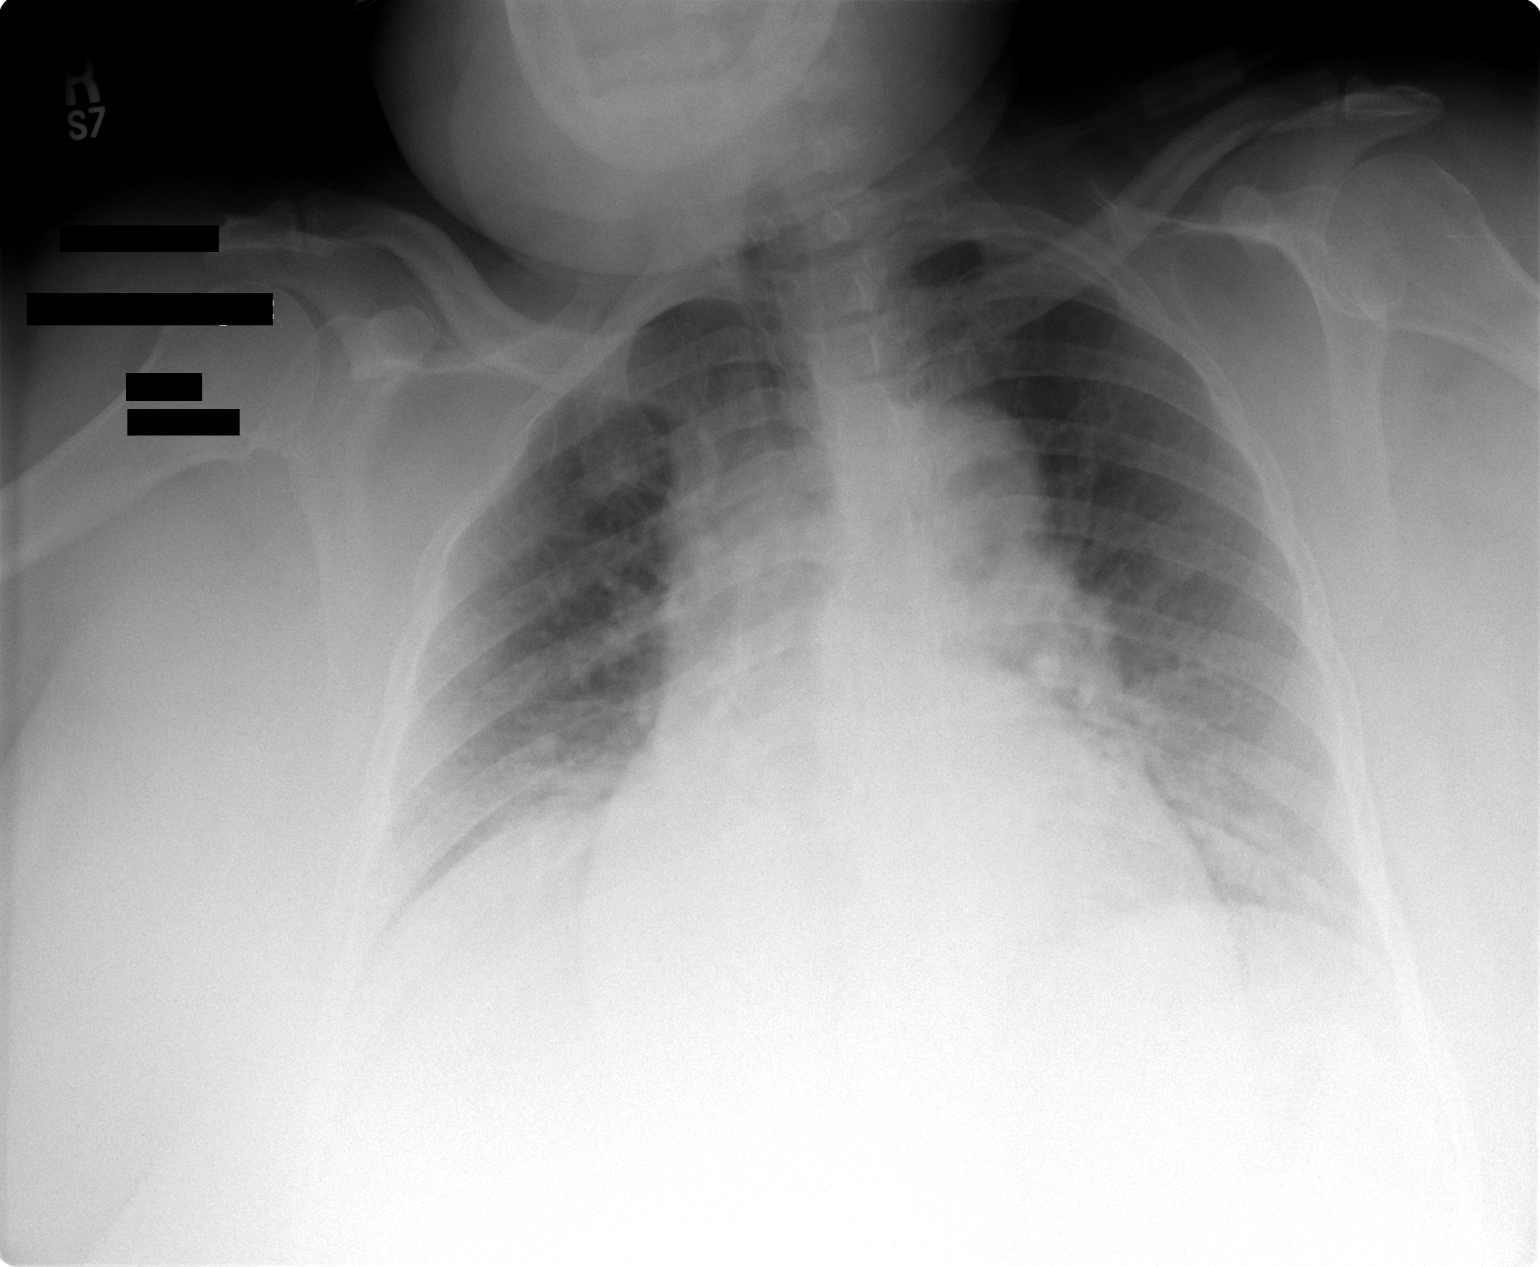

[1 of 1 positions shown; findings below may reference images not displayed]

IMPRESSION: Cardiomegaly.  Mild edema.  Elevation right hemidiaphragm which is stable.  Bilateral basilar atelectasis, right greater than left.

## 2005-11-03 ENCOUNTER — Encounter: Admission: RE | Admit: 2005-11-03 | Discharge: 2005-11-03 | Payer: Self-pay | Admitting: Unknown Physician Specialty

## 2005-11-22 IMAGING — CR DG CHEST 1V PORT
1 series · 1 of 1 positions shown · non-contrast
Comparison: 11/13/04.
CHEST PORTABLE ONE VIEW:

CLINICAL DATA:

[view not recorded]
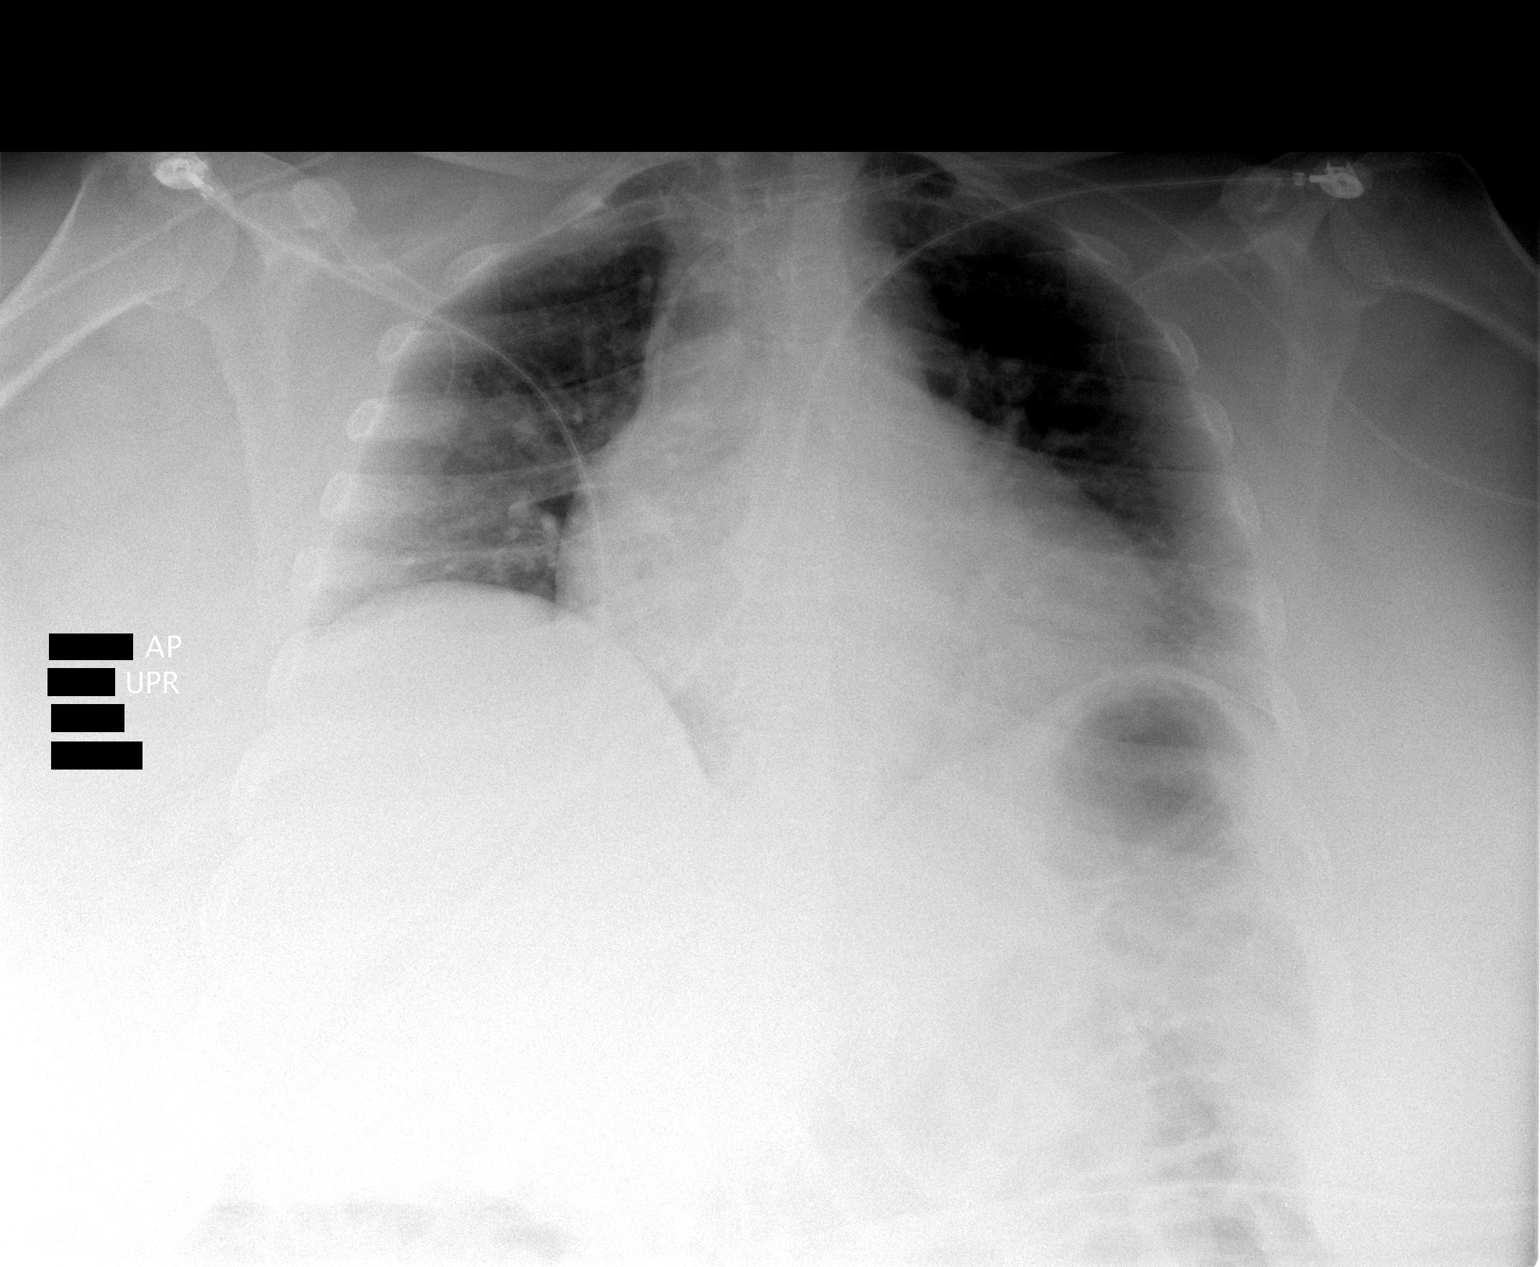

[1 of 1 positions shown; findings below may reference images not displayed]

FINDINGS: Lungs are suboptimally inflated but clear.  Heart size appears enlarged but may be due to portable technique.  There is a left-sided PICC line with tip in the SVC.
IMPRESSION: Left PICC line with tip in SVC.

## 2006-05-29 ENCOUNTER — Ambulatory Visit (HOSPITAL_COMMUNITY): Admission: RE | Admit: 2006-05-29 | Discharge: 2006-05-29 | Payer: Self-pay | Admitting: Unknown Physician Specialty

## 2006-06-28 ENCOUNTER — Emergency Department (HOSPITAL_COMMUNITY): Admission: EM | Admit: 2006-06-28 | Discharge: 2006-06-29 | Payer: Self-pay | Admitting: Emergency Medicine

## 2006-07-07 IMAGING — CT CT HEAD W/O CM
1 series · 16 of 30 positions shown, 20 images · non-contrast
Comparison: none

CLINICAL DATA: Headache

[Series 2: brain · axial · 0.47mm/px · z∈[+123,+260]mm · 16 of 30 slices shown, 20 images]
[im 2/30  brain]
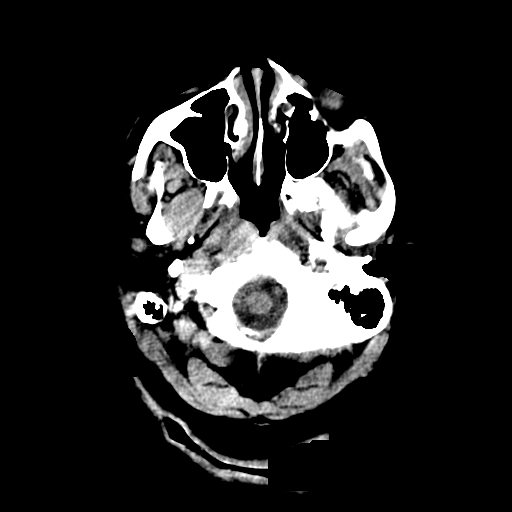
[im 2/30  bone]
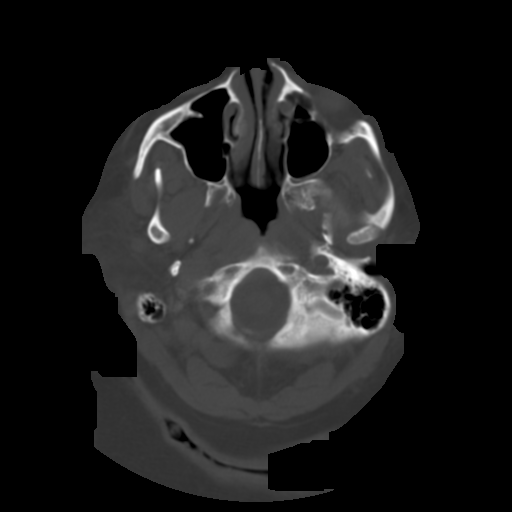
[im 4/30  brain]
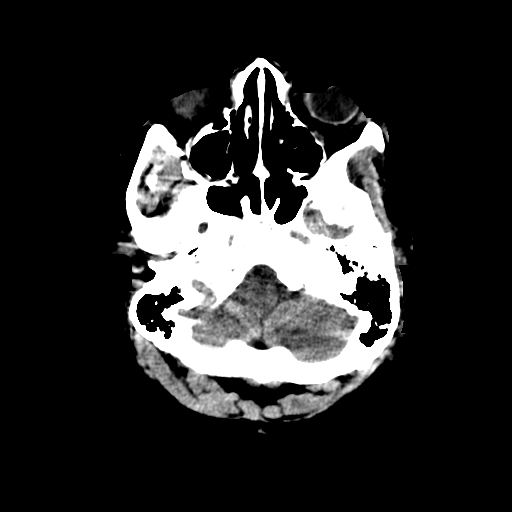
[im 6/30  brain]
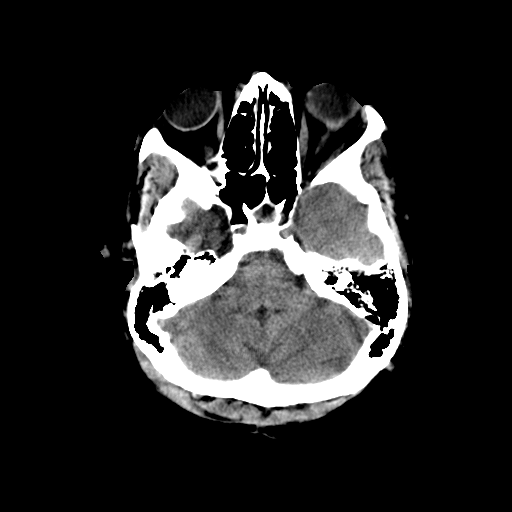
[im 8/30  brain]
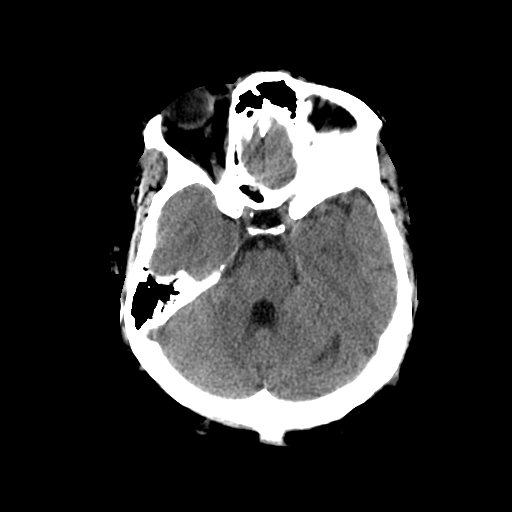
[im 9/30  brain]
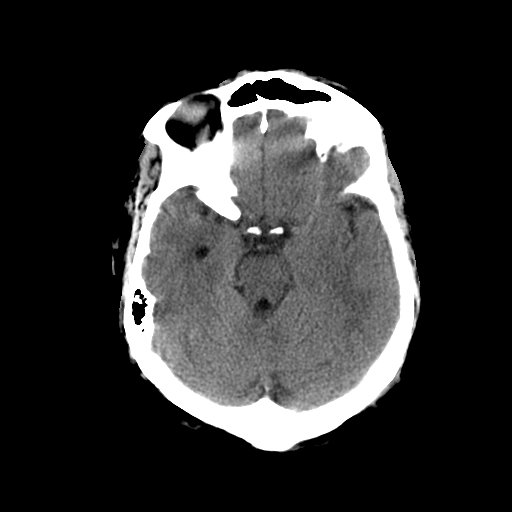
[im 9/30  bone]
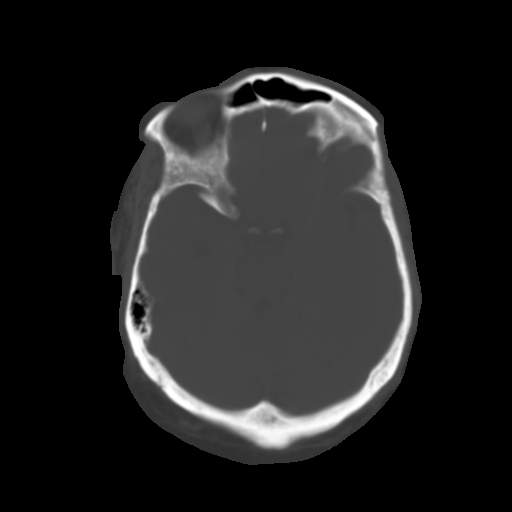
[im 11/30  brain]
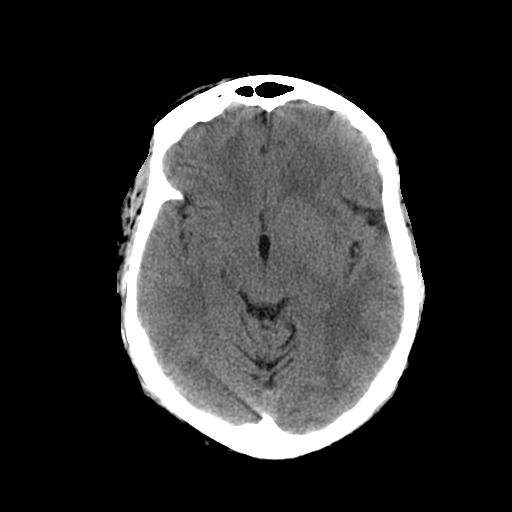
[im 13/30  brain]
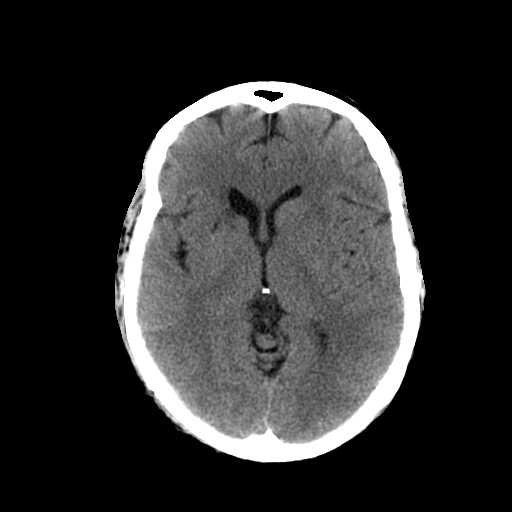
[im 15/30  brain]
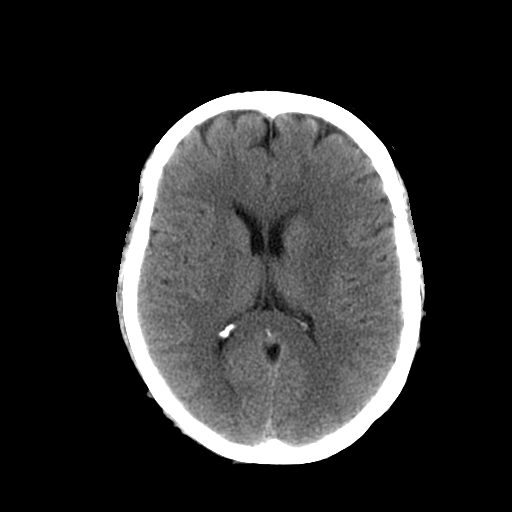
[im 16/30  brain]
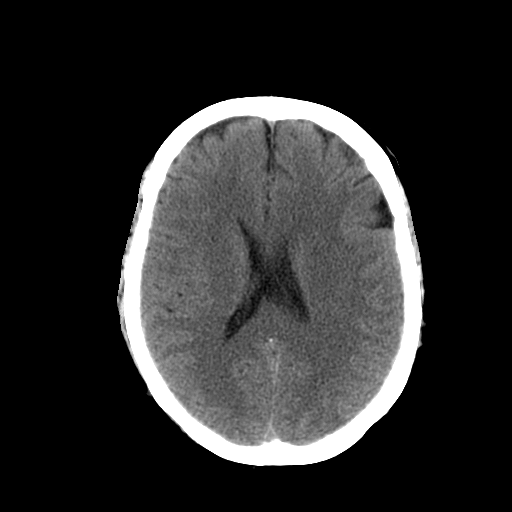
[im 16/30  bone]
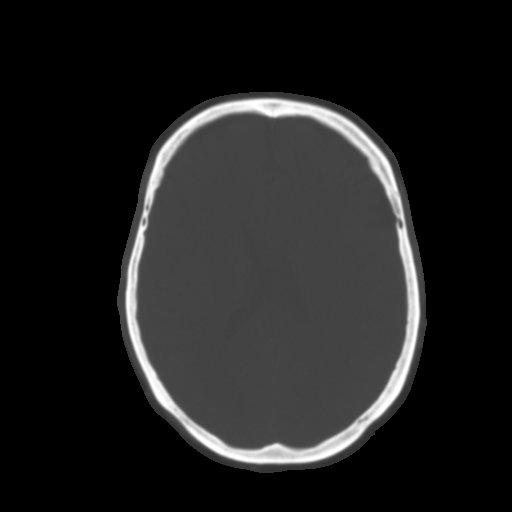
[im 18/30  brain]
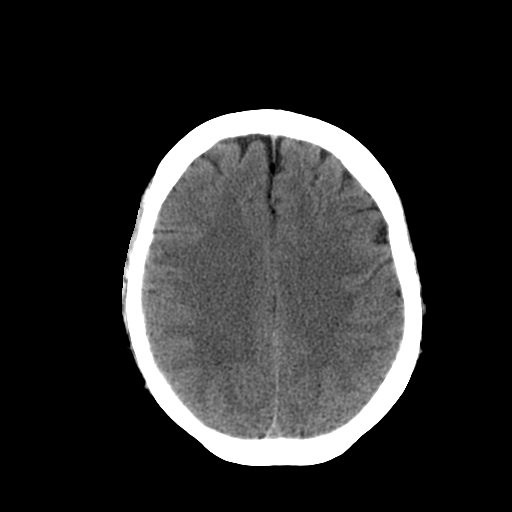
[im 20/30  brain]
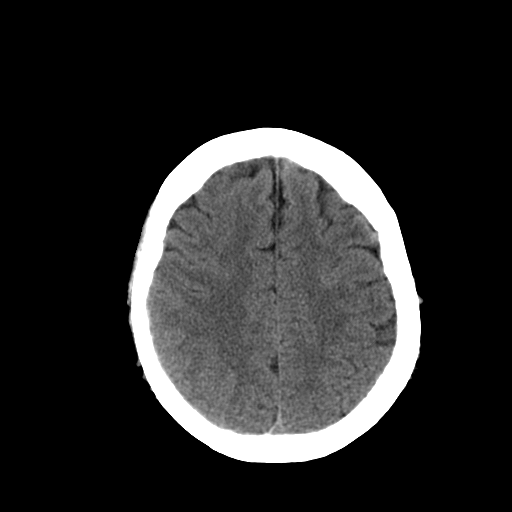
[im 22/30  brain]
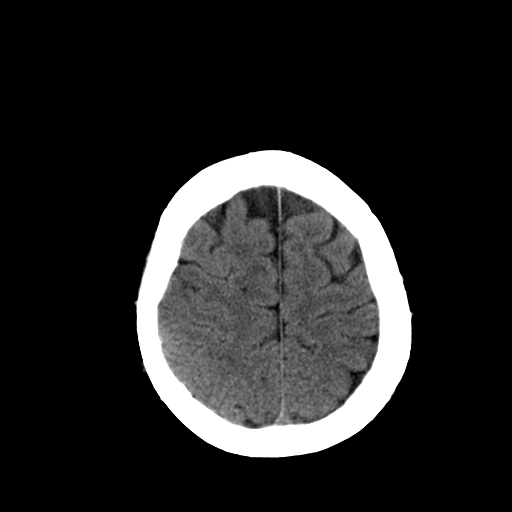
[im 23/30  brain]
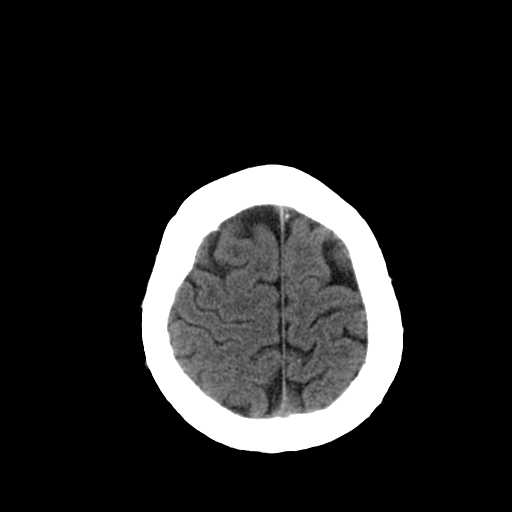
[im 23/30  bone]
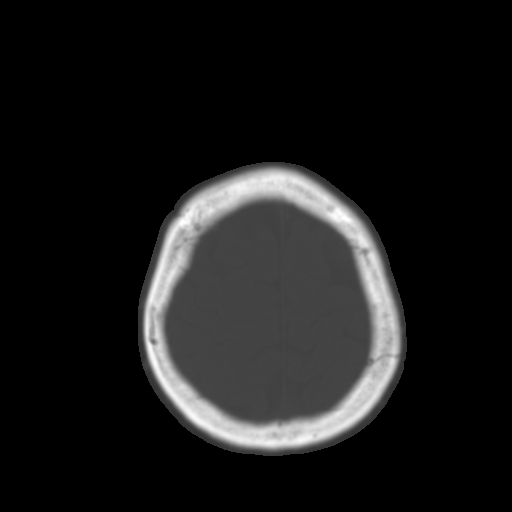
[im 25/30  brain]
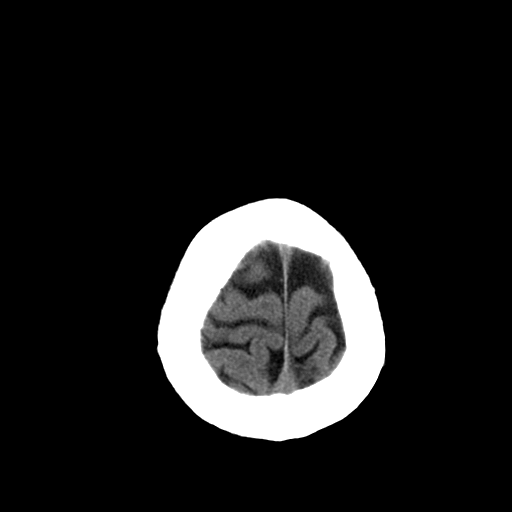
[im 27/30  brain]
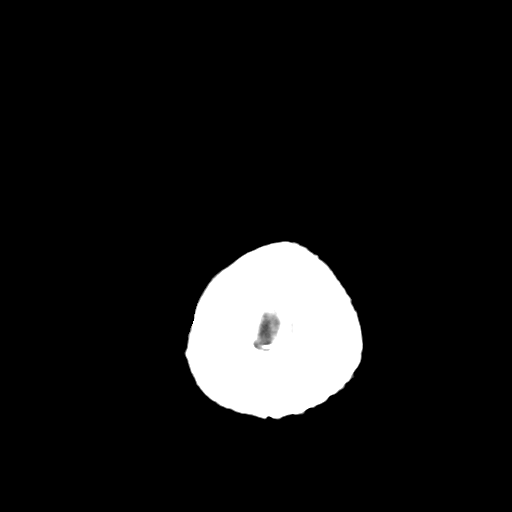
[im 29/30  brain]
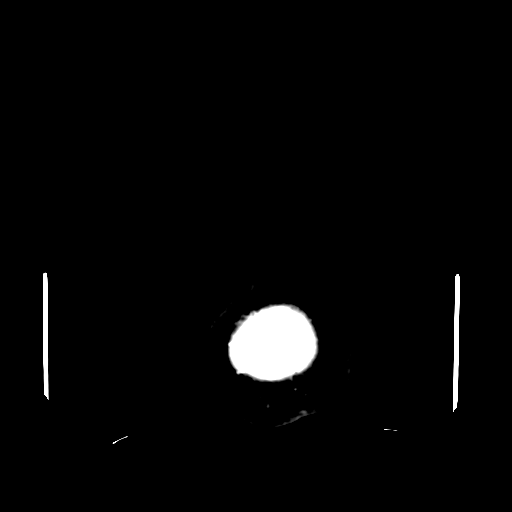

[16 of 30 positions shown; findings below may reference images not displayed]

CT head without contrast:

Comparison 11/11/2003. Lacunar infarcts in the right basal ganglia and in the
right caudate nucleus, stable since previous scan. There is no evidence of acute
intracranial hemorrhage, brain edema,  mass effect, or midline shift.   No other
intra-axial abnormalities are seen, and the ventricles and sulci are within
normal limits in size and symmetry.   No abnormal extra-axial fluid collections
or masses are identified.  No skull abnormalities are noted.
IMPRESSION: 1. Negative for bleed or other acute intracranial process.
2. Stable right caudate and basal ganglia lacunar infarcts

## 2006-07-25 IMAGING — CR DG CHEST 1V PORT
1 series · 1 of 1 positions shown · non-contrast
Comparison: 11/14/04.

CLINICAL DATA: Fever/weakness.
 PORTABLE CHEST - 1 VIEW:

[view not recorded]
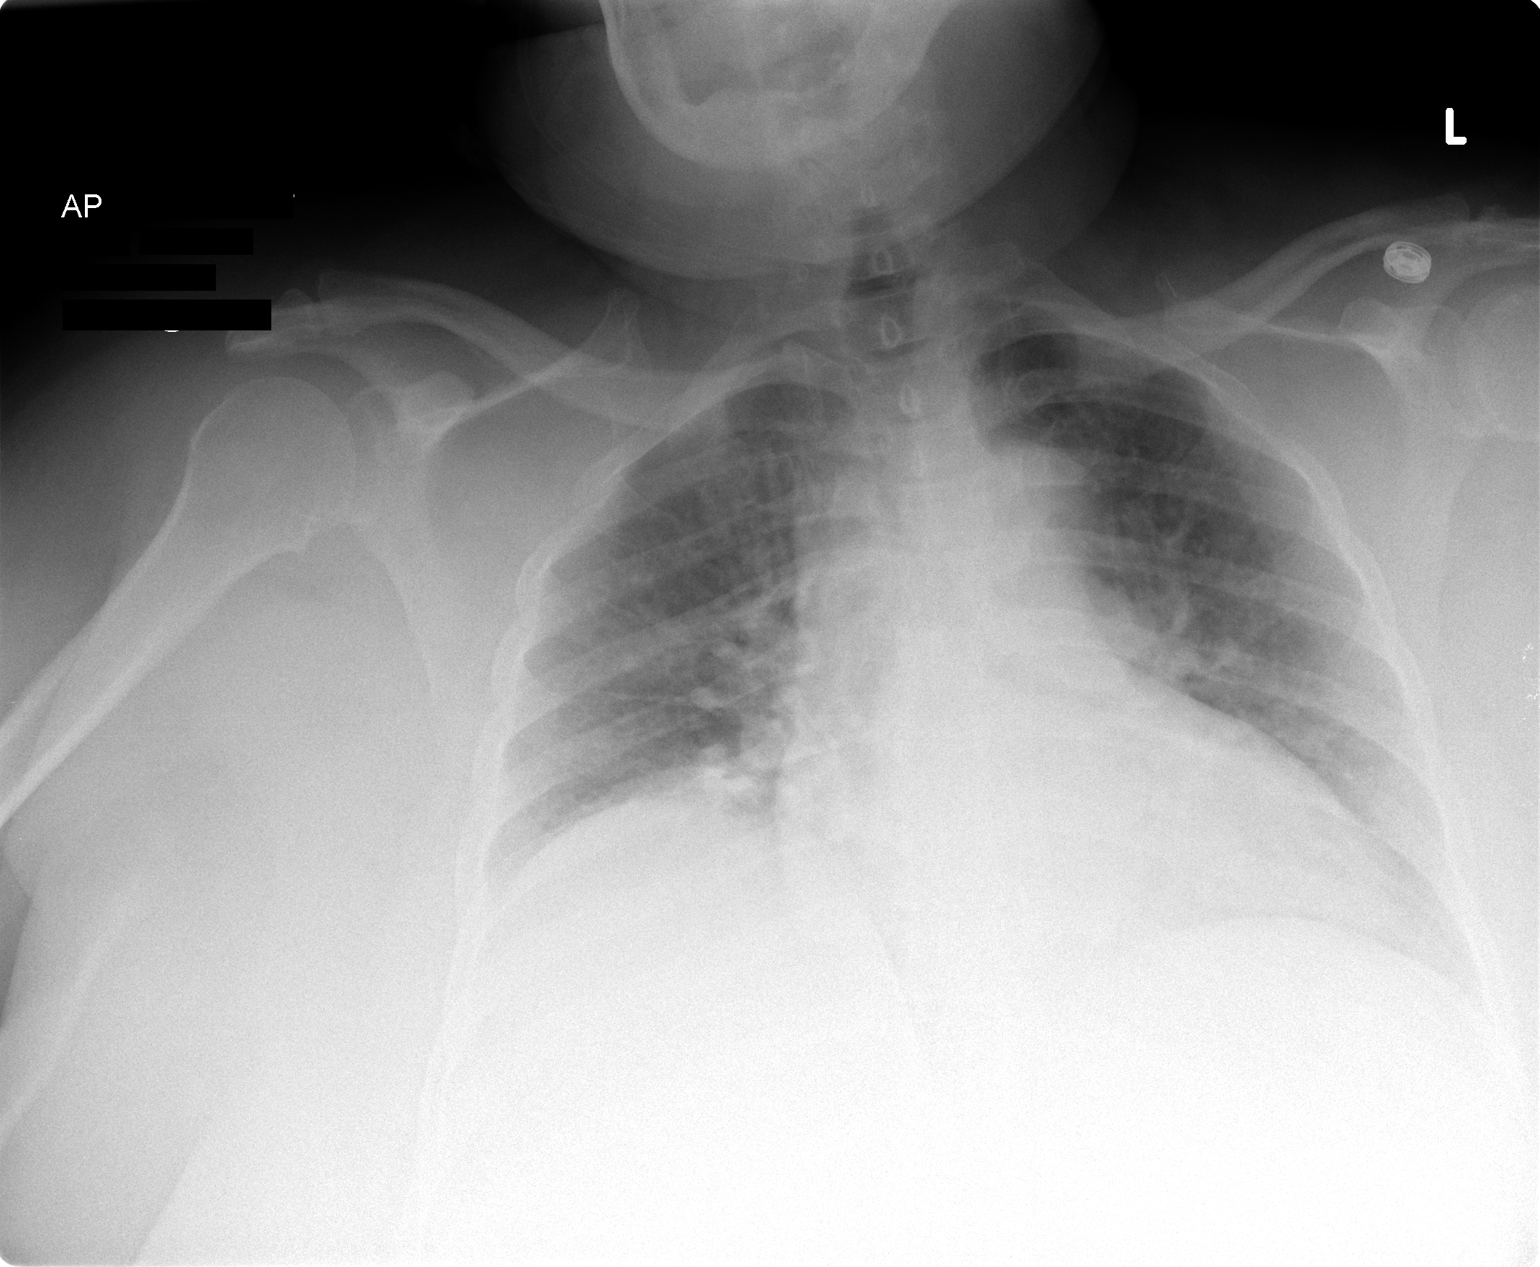

[1 of 1 positions shown; findings below may reference images not displayed]

FINDINGS: Low level of inspiration, especially on the right.  The right hemidiaphragm is moderately elevated but was similar in appearance on the prior study.  The heart is perhaps mildly enlarged.  No frank congestive heart failure or pneumonia.
IMPRESSION: Suboptimally inspired portable film with elevated right hemidiaphragm (chronic).  No definite congestive heart failure or active disease.

## 2006-11-23 ENCOUNTER — Encounter: Admission: RE | Admit: 2006-11-23 | Discharge: 2006-11-23 | Payer: Self-pay | Admitting: Internal Medicine

## 2006-12-16 ENCOUNTER — Emergency Department (HOSPITAL_COMMUNITY): Admission: EM | Admit: 2006-12-16 | Discharge: 2006-12-16 | Payer: Self-pay | Admitting: *Deleted

## 2007-05-09 ENCOUNTER — Ambulatory Visit: Payer: Self-pay | Admitting: Cardiology

## 2007-05-10 ENCOUNTER — Inpatient Hospital Stay (HOSPITAL_COMMUNITY): Admission: EM | Admit: 2007-05-10 | Discharge: 2007-05-15 | Payer: Self-pay | Admitting: Emergency Medicine

## 2007-05-11 ENCOUNTER — Encounter (INDEPENDENT_AMBULATORY_CARE_PROVIDER_SITE_OTHER): Payer: Self-pay | Admitting: Emergency Medicine

## 2007-06-19 ENCOUNTER — Observation Stay (HOSPITAL_COMMUNITY): Admission: EM | Admit: 2007-06-19 | Discharge: 2007-06-20 | Payer: Self-pay | Admitting: Emergency Medicine

## 2007-07-04 ENCOUNTER — Inpatient Hospital Stay (HOSPITAL_COMMUNITY): Admission: EM | Admit: 2007-07-04 | Discharge: 2007-07-14 | Payer: Self-pay | Admitting: Emergency Medicine

## 2007-07-04 ENCOUNTER — Encounter (INDEPENDENT_AMBULATORY_CARE_PROVIDER_SITE_OTHER): Payer: Self-pay | Admitting: Internal Medicine

## 2007-07-04 ENCOUNTER — Ambulatory Visit: Payer: Self-pay | Admitting: Vascular Surgery

## 2007-07-04 ENCOUNTER — Ambulatory Visit: Payer: Self-pay | Admitting: Internal Medicine

## 2007-07-06 IMAGING — CR DG CHEST 1V PORT
1 series · 1 of 1 positions shown · non-contrast
Comparison: 07/17/2005

CLINICAL DATA: Chest pain. 
 PORTABLE CHEST - 1 VIEW:

[view not recorded]
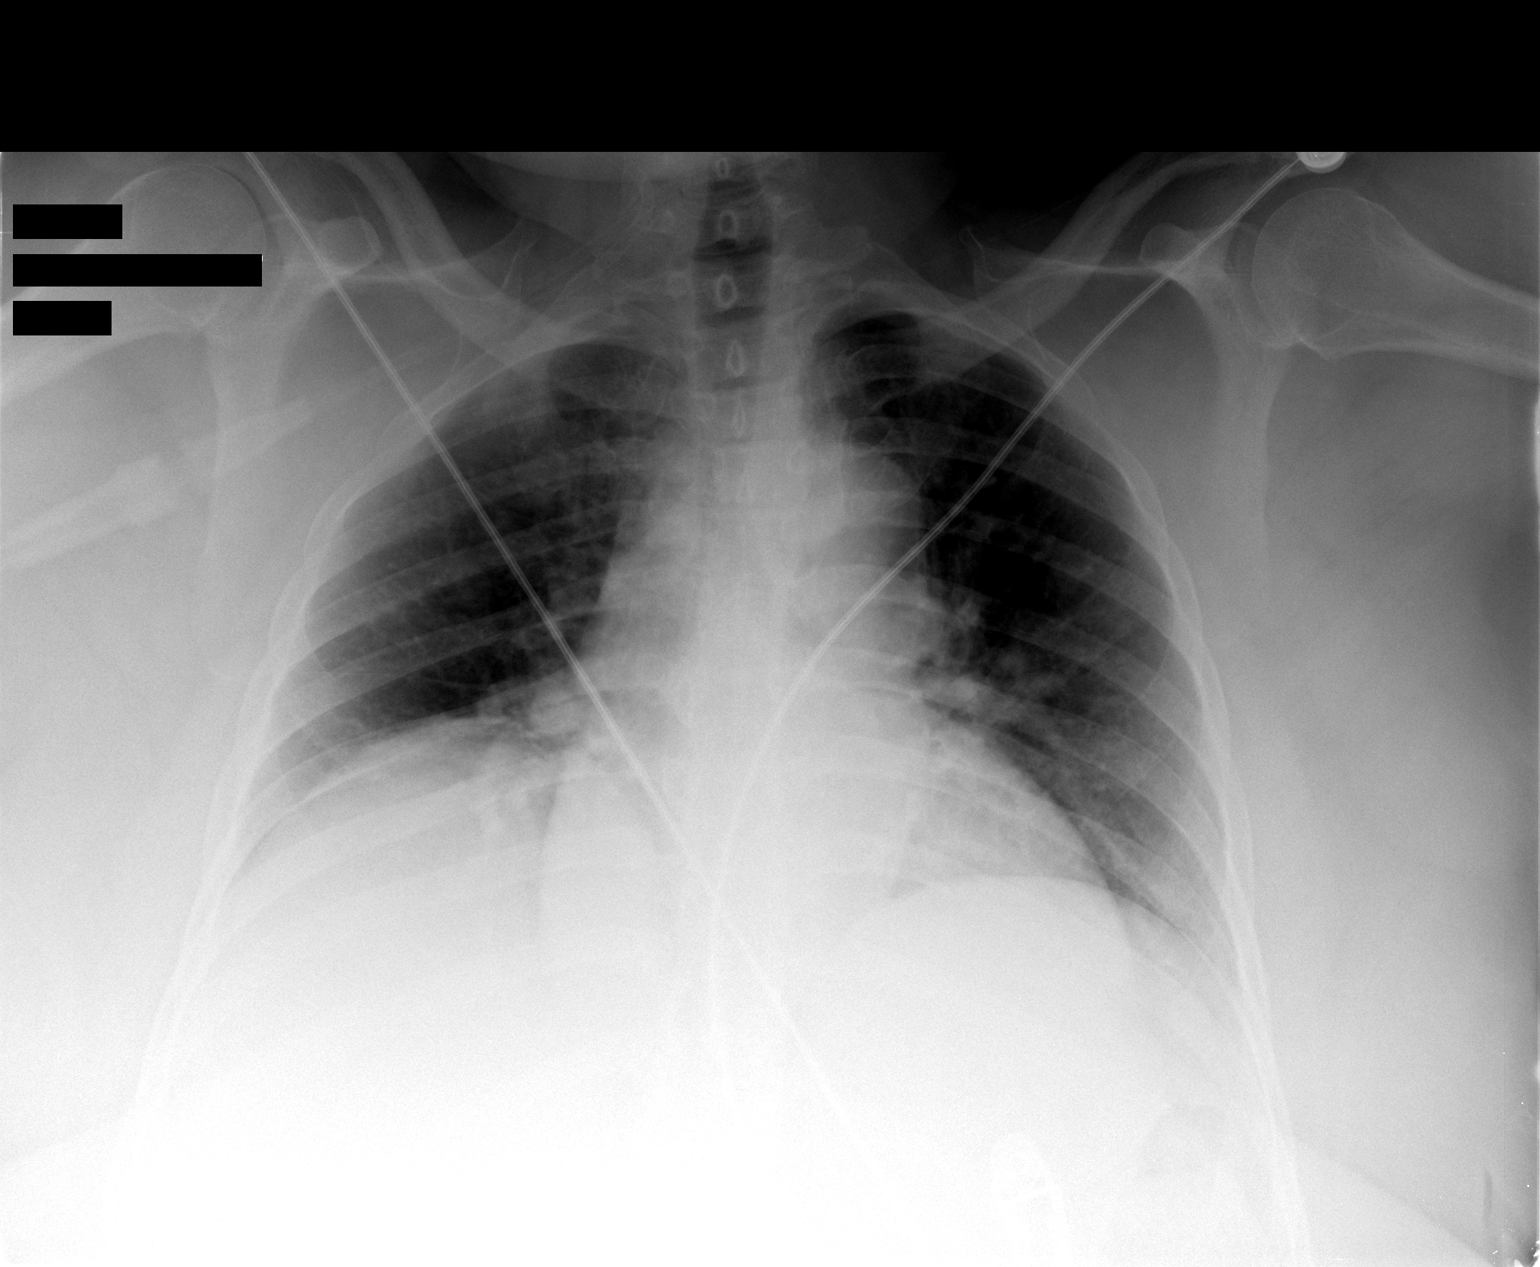

[1 of 1 positions shown; findings below may reference images not displayed]

FINDINGS: Low volume film noted.  Mild bibasilar scarring and mild peribronchial thickening are unchanged.  Little interval change noted since the prior study.
IMPRESSION: Low volume with stable right basilar atelectasis/scarring and peribronchial thickening.

## 2007-08-10 ENCOUNTER — Emergency Department (HOSPITAL_COMMUNITY): Admission: EM | Admit: 2007-08-10 | Discharge: 2007-08-11 | Payer: Self-pay | Admitting: Emergency Medicine

## 2007-09-14 DIAGNOSIS — J4489 Other specified chronic obstructive pulmonary disease: Secondary | ICD-10-CM | POA: Insufficient documentation

## 2007-09-14 DIAGNOSIS — F319 Bipolar disorder, unspecified: Secondary | ICD-10-CM | POA: Insufficient documentation

## 2007-09-14 DIAGNOSIS — I890 Lymphedema, not elsewhere classified: Secondary | ICD-10-CM | POA: Insufficient documentation

## 2007-09-14 DIAGNOSIS — I10 Essential (primary) hypertension: Secondary | ICD-10-CM | POA: Insufficient documentation

## 2007-09-14 DIAGNOSIS — F329 Major depressive disorder, single episode, unspecified: Secondary | ICD-10-CM | POA: Insufficient documentation

## 2007-09-14 DIAGNOSIS — G4733 Obstructive sleep apnea (adult) (pediatric): Secondary | ICD-10-CM

## 2007-09-14 DIAGNOSIS — J449 Chronic obstructive pulmonary disease, unspecified: Secondary | ICD-10-CM | POA: Insufficient documentation

## 2007-09-14 DIAGNOSIS — K219 Gastro-esophageal reflux disease without esophagitis: Secondary | ICD-10-CM

## 2007-09-17 ENCOUNTER — Ambulatory Visit: Payer: Self-pay | Admitting: Pulmonary Disease

## 2007-09-17 DIAGNOSIS — J9610 Chronic respiratory failure, unspecified whether with hypoxia or hypercapnia: Secondary | ICD-10-CM | POA: Insufficient documentation

## 2007-09-17 DIAGNOSIS — J309 Allergic rhinitis, unspecified: Secondary | ICD-10-CM | POA: Insufficient documentation

## 2007-09-17 DIAGNOSIS — R0902 Hypoxemia: Secondary | ICD-10-CM | POA: Insufficient documentation

## 2007-09-19 ENCOUNTER — Ambulatory Visit: Payer: Self-pay | Admitting: Pulmonary Disease

## 2007-09-19 ENCOUNTER — Ambulatory Visit (HOSPITAL_BASED_OUTPATIENT_CLINIC_OR_DEPARTMENT_OTHER): Admission: RE | Admit: 2007-09-19 | Discharge: 2007-09-19 | Payer: Self-pay | Admitting: Pulmonary Disease

## 2007-09-24 ENCOUNTER — Ambulatory Visit: Payer: Self-pay | Admitting: Pulmonary Disease

## 2007-10-30 ENCOUNTER — Ambulatory Visit: Payer: Self-pay | Admitting: Pulmonary Disease

## 2007-11-27 ENCOUNTER — Encounter: Admission: RE | Admit: 2007-11-27 | Discharge: 2007-11-27 | Payer: Self-pay | Admitting: *Deleted

## 2007-12-24 IMAGING — CR DG CHEST 1V PORT
1 series · 1 of 1 positions shown · non-contrast
Comparison: 06/28/06.

CLINICAL DATA: Chest pain.  Diabetes.
 PORTABLE CHEST ? 1 VIEW ? 12/16/06 ? 2428 HOURS:

[view not recorded]
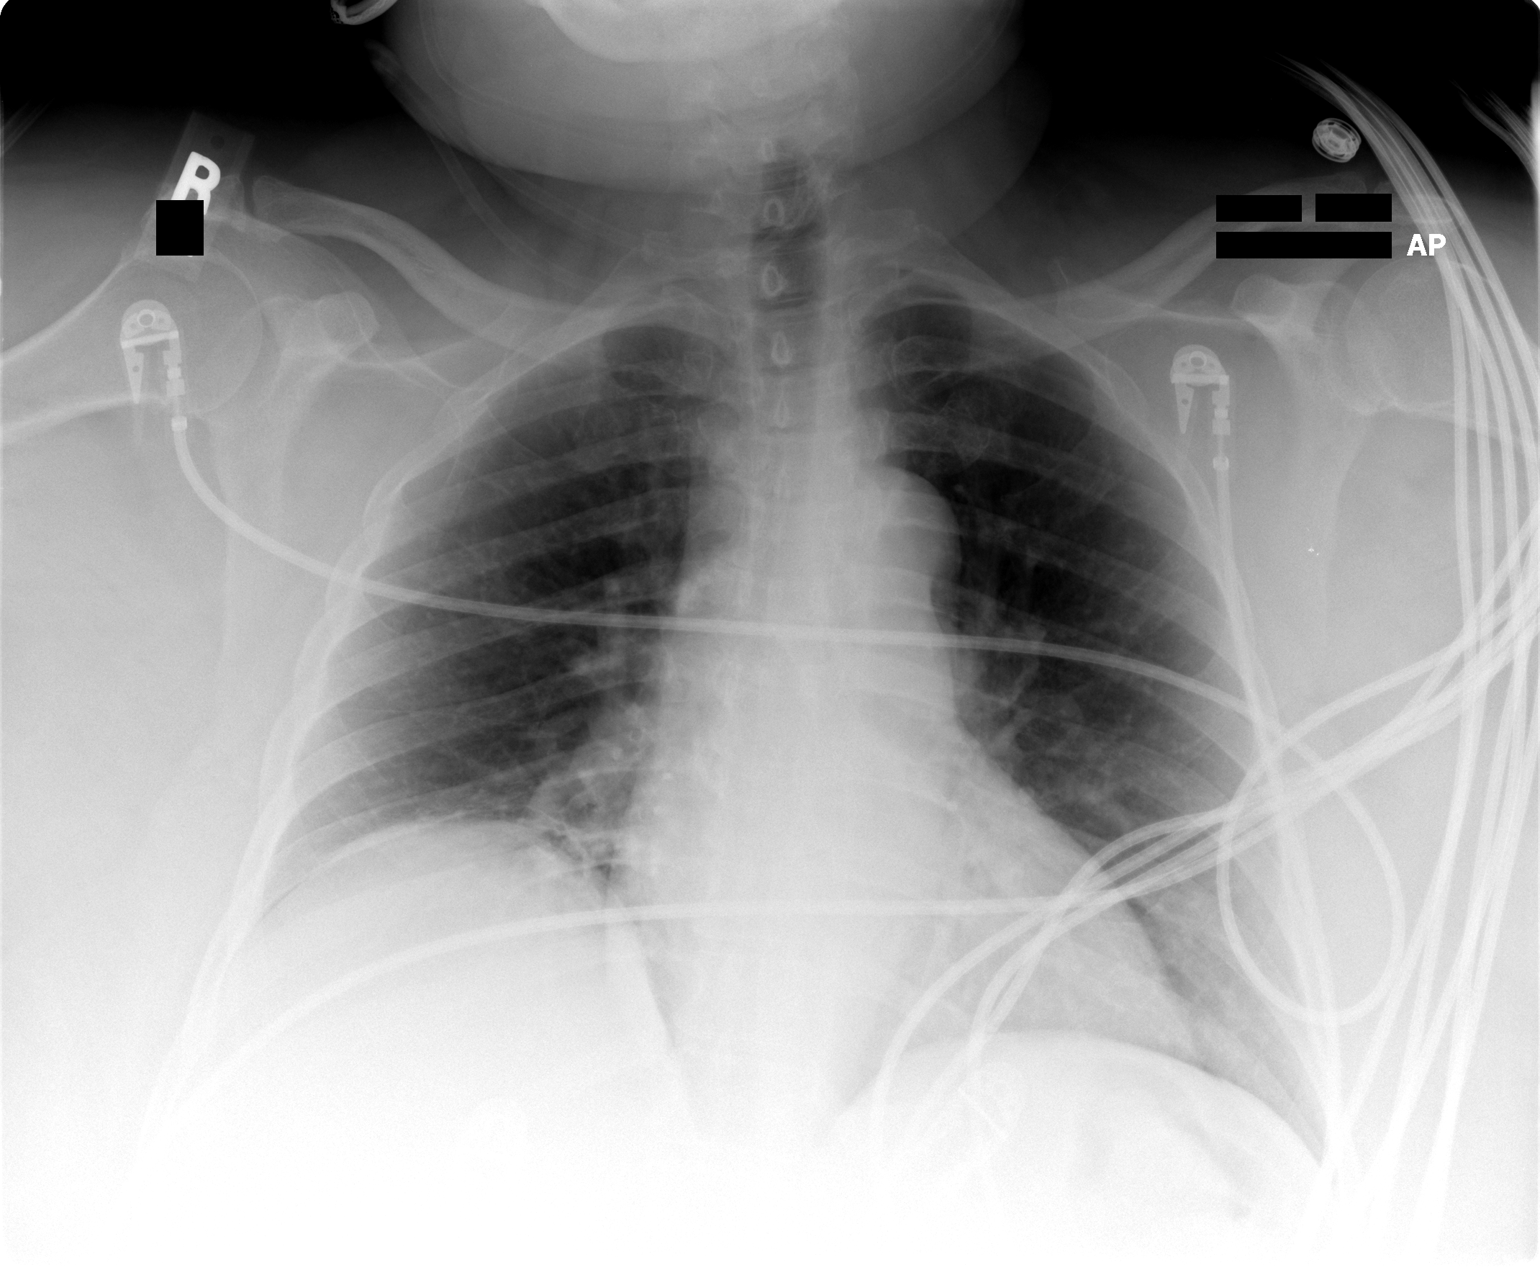

[1 of 1 positions shown; findings below may reference images not displayed]

FINDINGS: The lungs appear clear.  No infiltrate or effusion is seen.  Mild cardiomegaly is stable.
IMPRESSION: Mild cardiomegaly.  No active lung disease.

## 2008-02-06 ENCOUNTER — Emergency Department (HOSPITAL_COMMUNITY): Admission: EM | Admit: 2008-02-06 | Discharge: 2008-02-06 | Payer: Self-pay | Admitting: Emergency Medicine

## 2008-02-21 ENCOUNTER — Ambulatory Visit (HOSPITAL_COMMUNITY): Admission: RE | Admit: 2008-02-21 | Discharge: 2008-02-21 | Payer: Self-pay | Admitting: *Deleted

## 2008-03-14 ENCOUNTER — Ambulatory Visit: Payer: Self-pay | Admitting: Pulmonary Disease

## 2008-05-16 IMAGING — CR DG CHEST 1V PORT
1 series · 1 of 1 positions shown · non-contrast
Comparison: 12/16/06.

CLINICAL DATA: Short of breath.  Chest pain. 
 PORTABLE CHEST - 1 VIEW, 05/09/07, 3041 HOURS:

[view not recorded]
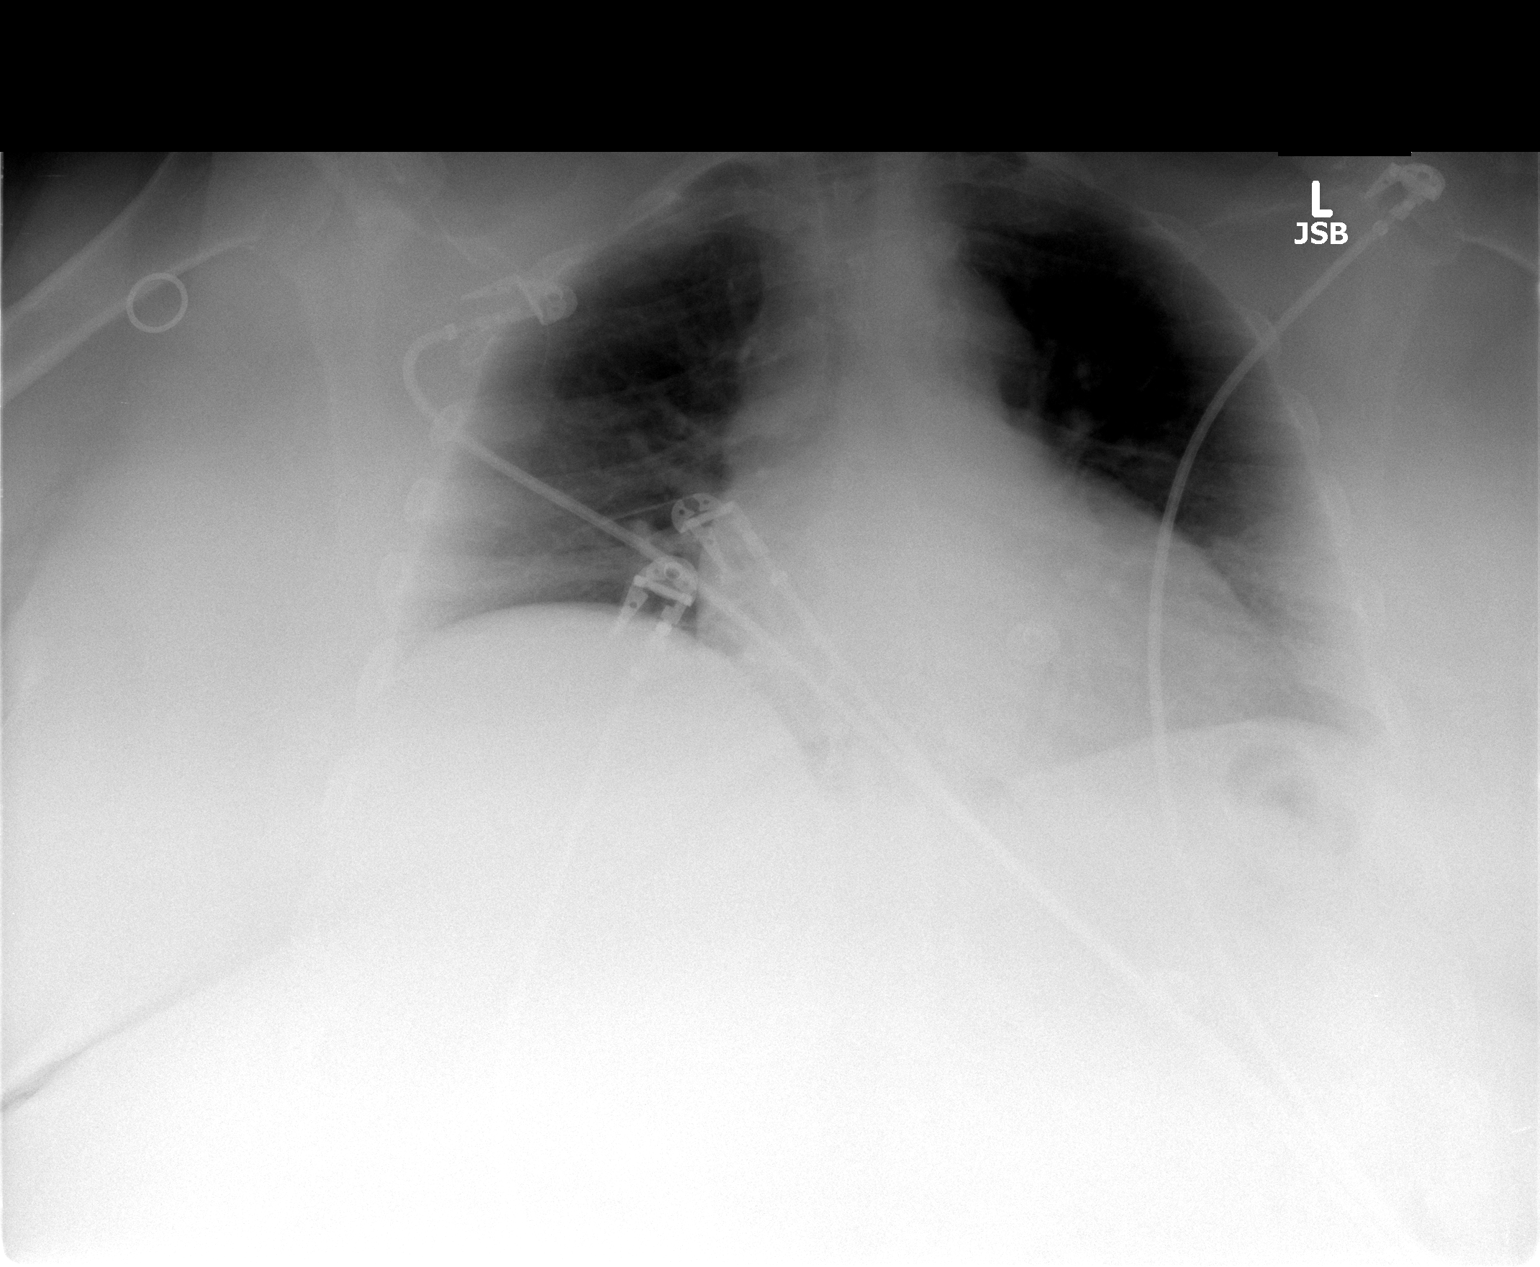

[1 of 1 positions shown; findings below may reference images not displayed]

FINDINGS: Suboptimal inspiration with low lung volumes.  The heart is enlarged.  There is no edema or effusion or infiltrate.
IMPRESSION: Hypoventilation.  No acute abnormality.

## 2008-05-19 IMAGING — CR DG CHEST 1V
1 series · 1 of 1 positions shown · non-contrast
Comparison: 05/09/2007

CLINICAL DATA: COPD. 
 CHEST ? 1 VIEW:

[view not recorded]
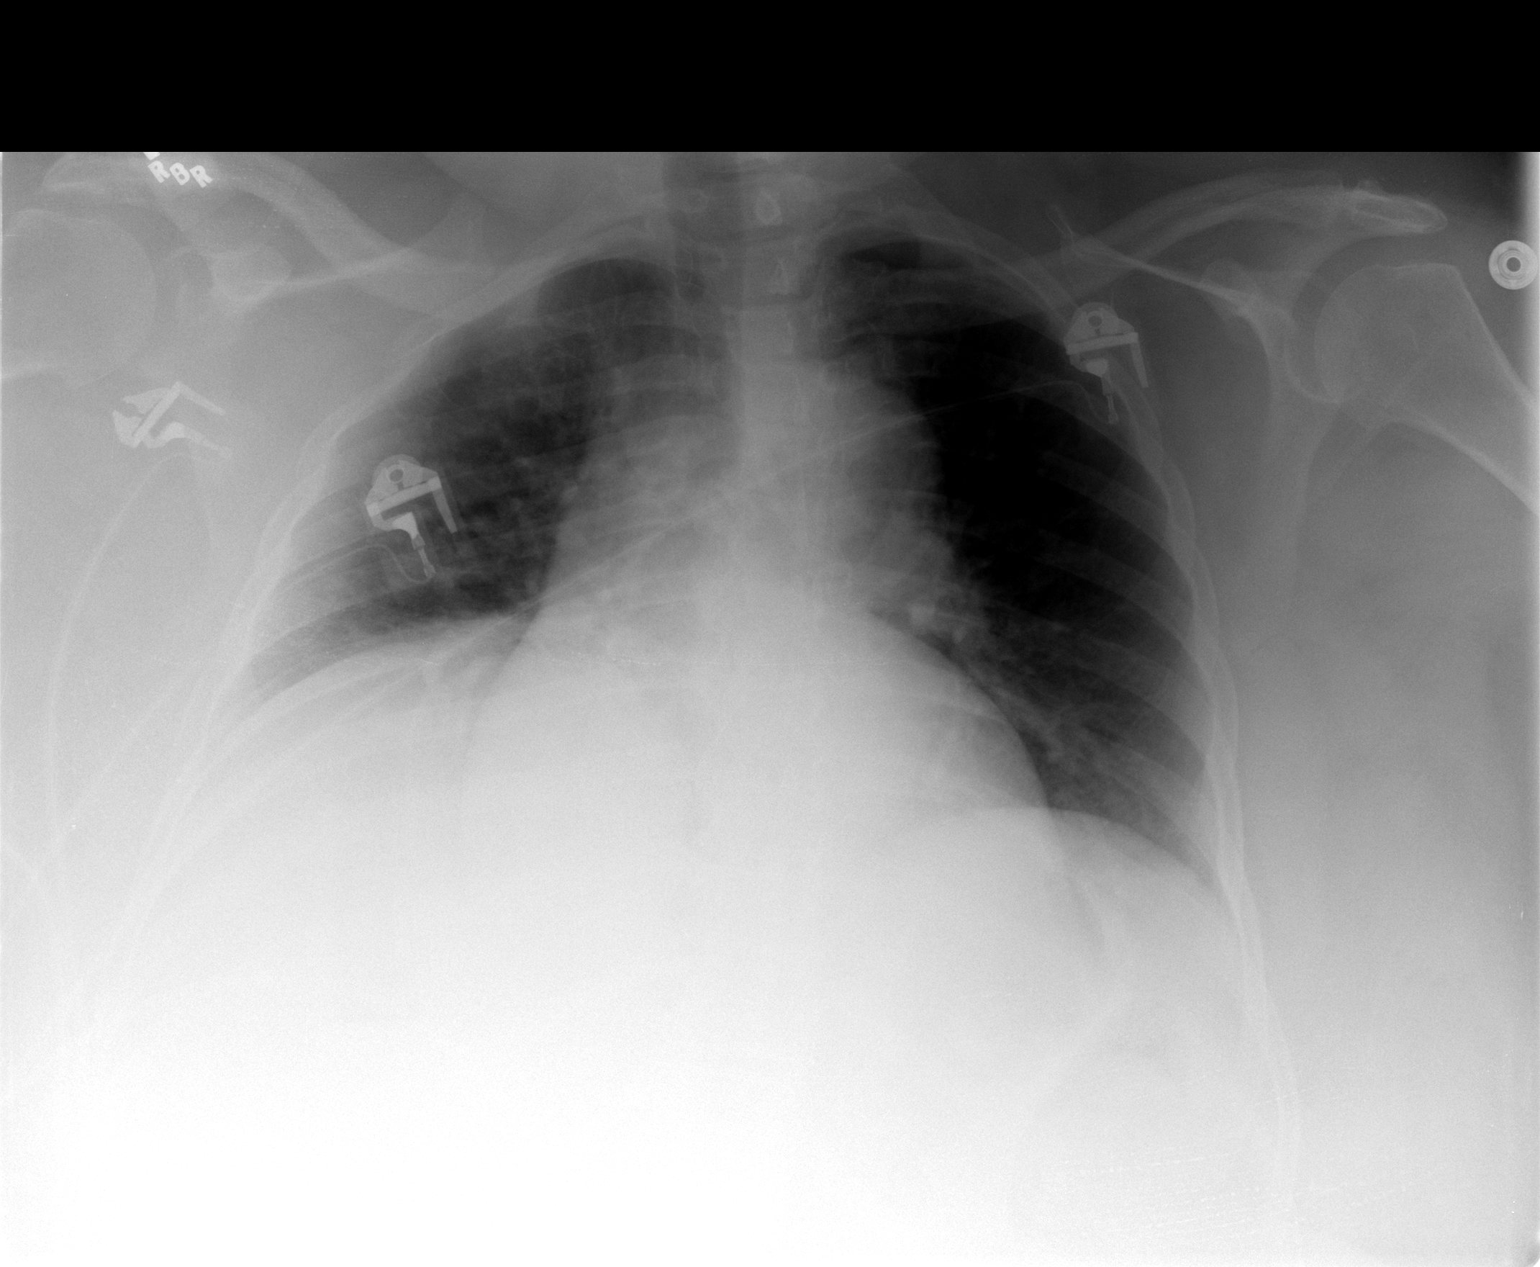

[1 of 1 positions shown; findings below may reference images not displayed]

FINDINGS: There is cardiomegaly.  Very low lung volumes noted.  There is right base atelectasis.  No focal opacity on the left.  No effusions.
IMPRESSION: Cardiomegaly.      
  Low      volumes, right base atelectasis.

## 2008-05-19 IMAGING — CR DG HIP (WITH OR WITHOUT PELVIS) 2-3V*L*
3 series · 3 of 3 positions shown · non-contrast
Comparison: 11/14/2003

CLINICAL DATA: Left hip pain, no injury. 
 LEFT HIP ? 2 VIEW:

[t pelvis a.p.]
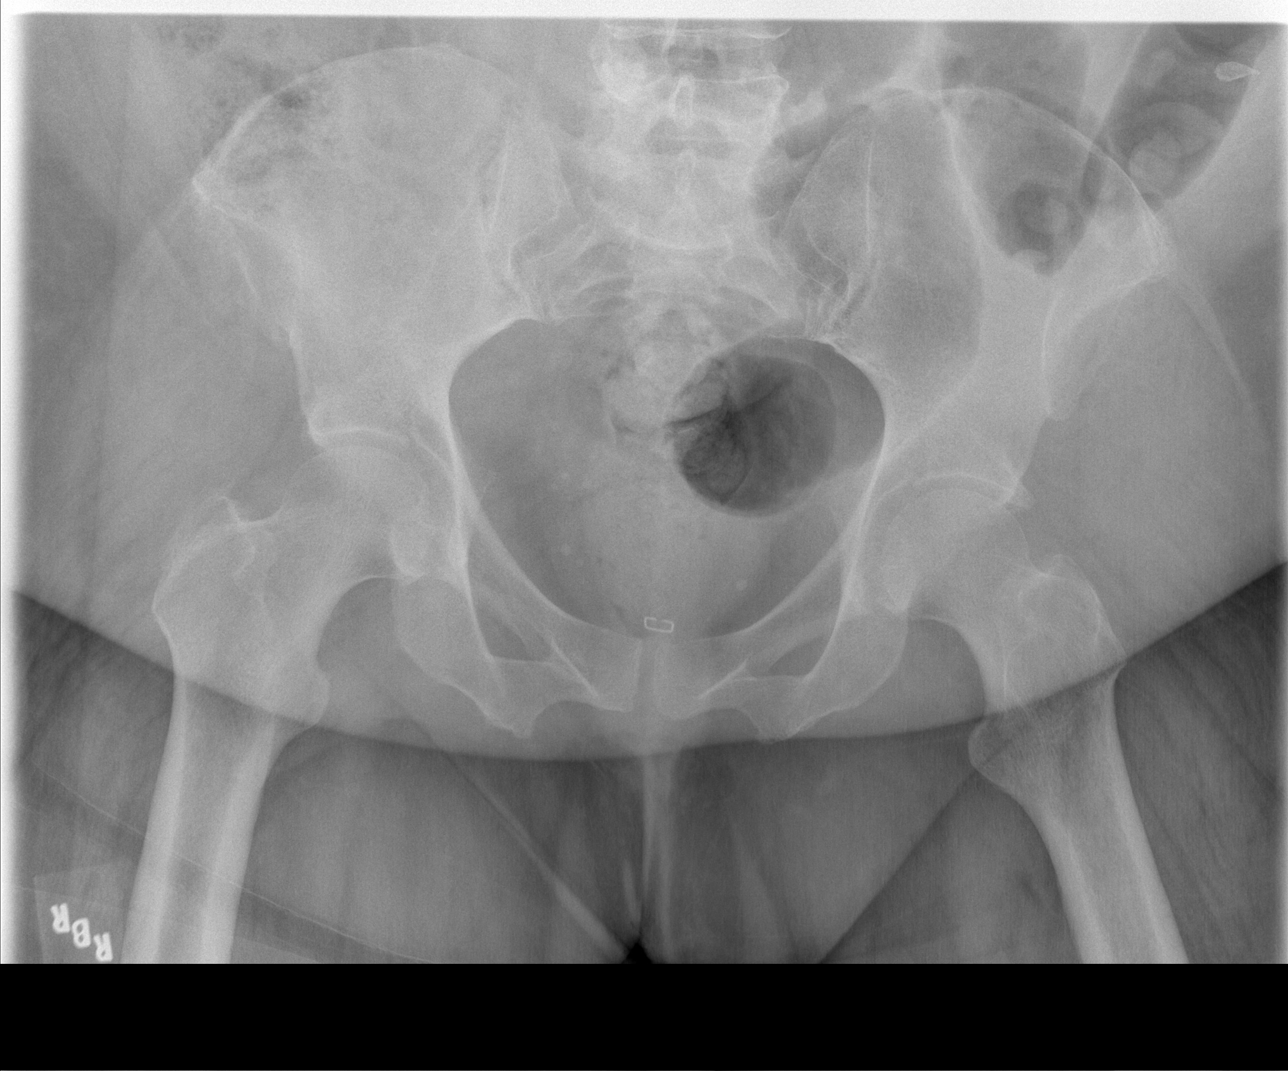

[t hip ap left]
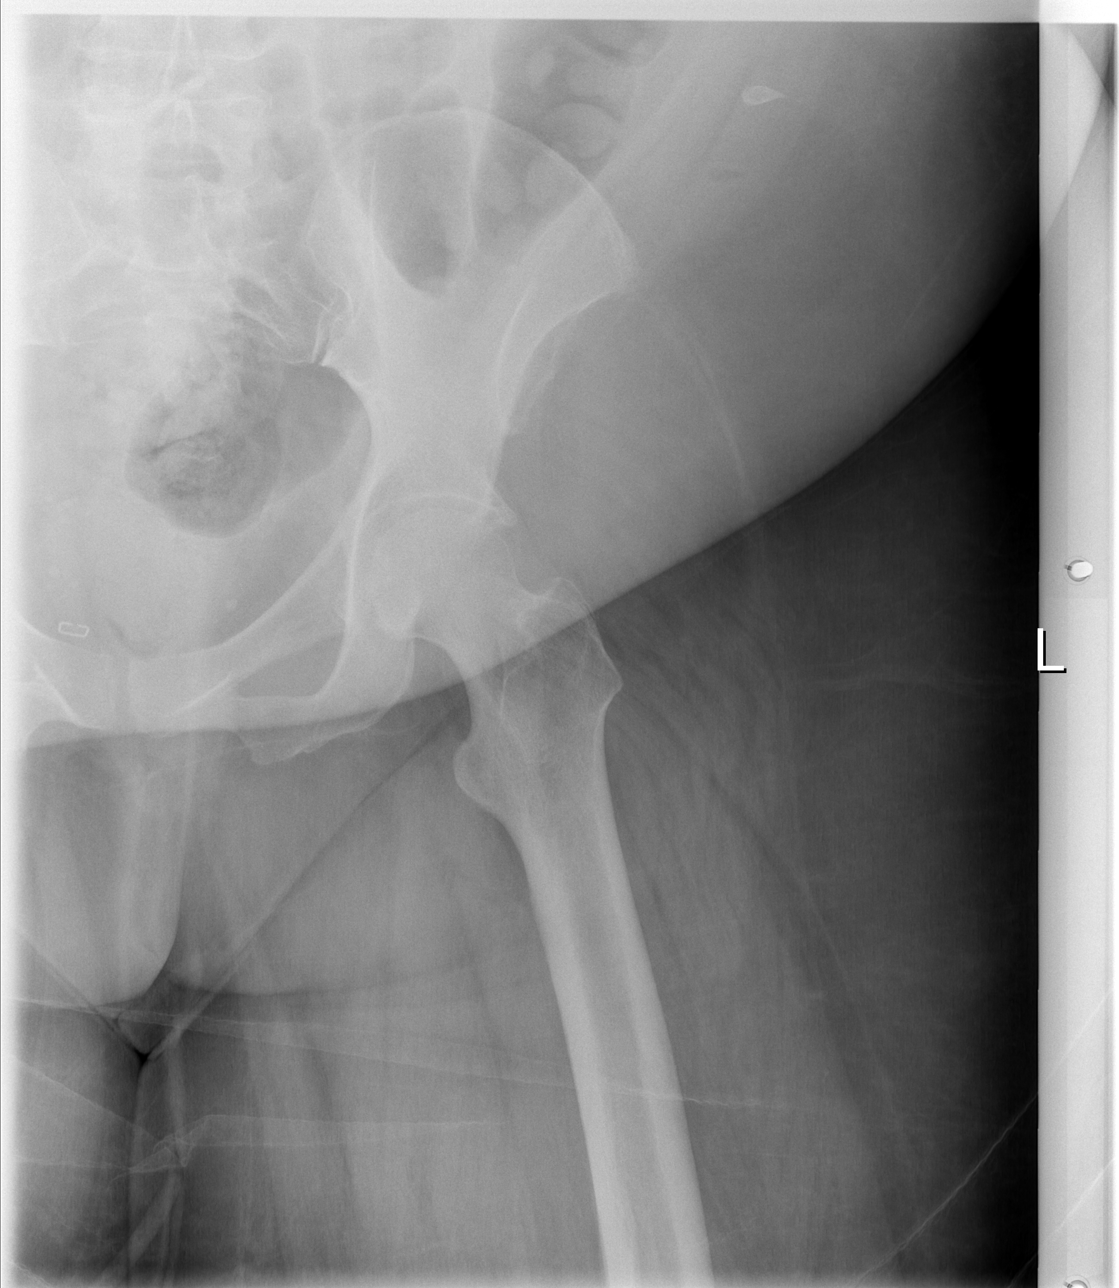

[t hip frog leg left]
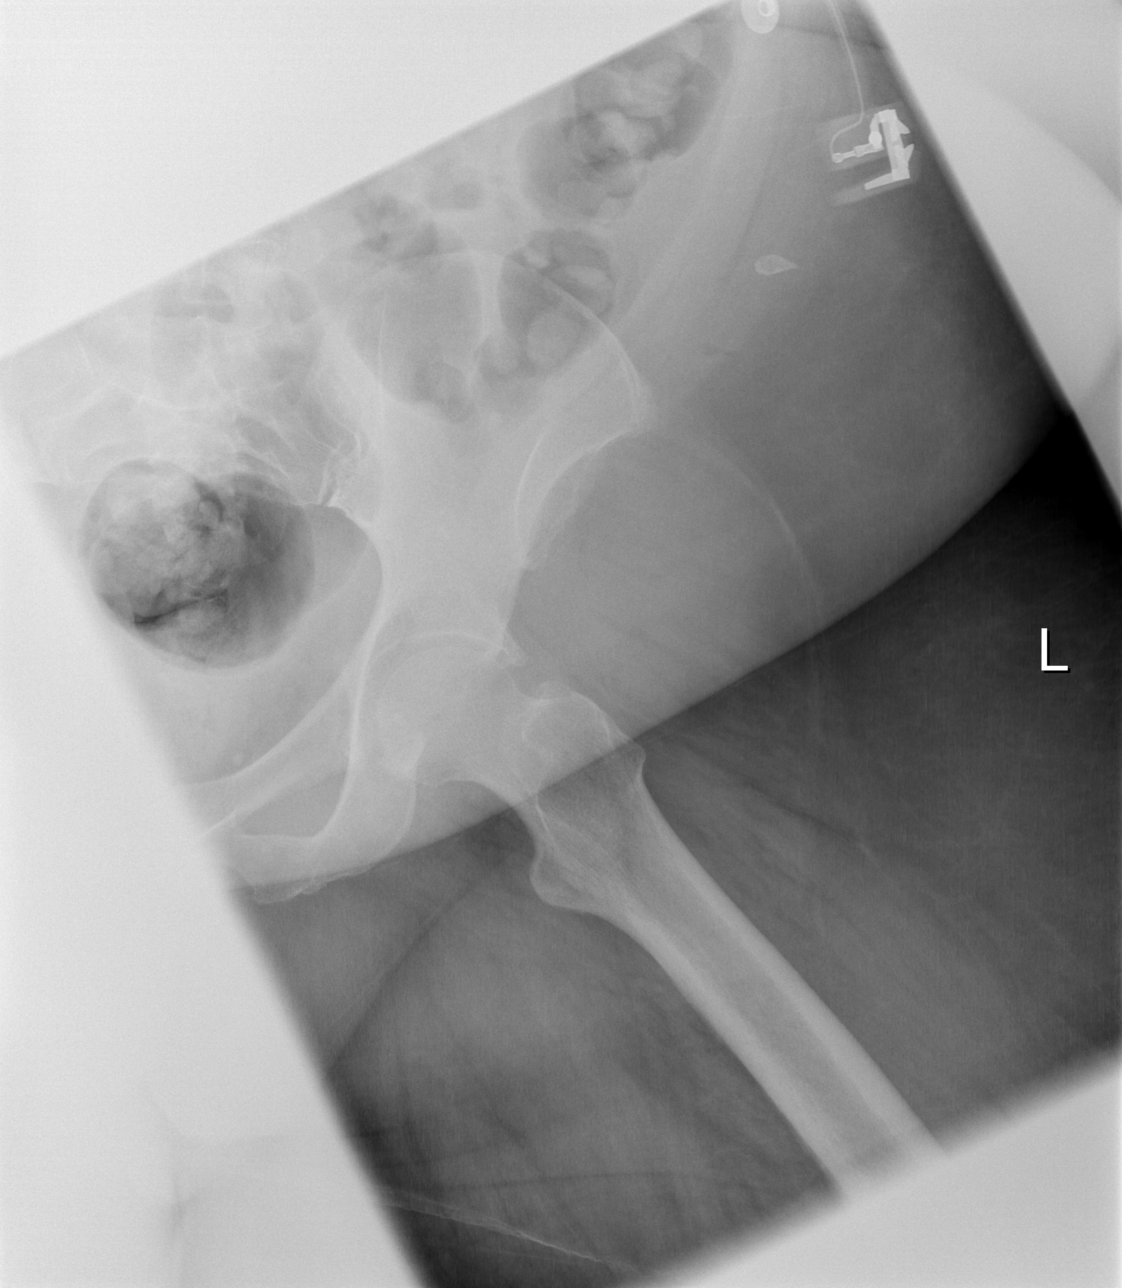

[3 of 3 positions shown; findings below may reference images not displayed]

FINDINGS: No acute bony abnormality.  Specifically, no fracture.  Mild degenerative changes in the hips bilaterally. SI joints are symmetric. 
 There is a staple noted near the midline, projecting just superior to the symphysis pubis.  This appears to be a skin staple.  This was present on prior study.  It is unknown whether this is anterior or posterior.  Recommend clinical correlation.
IMPRESSION: No      evidence of left hip acute bony abnormality. 
  There      appears to be a skin staple, which was present in 1777 and still present.? It is unknown if t his is anterior or      posterior. Recommend clinical correlation.

## 2008-05-20 IMAGING — CT CT PELVIS W/O CM
1 series · 16 of 32 positions shown, 20 images · IV contrast (agent unspecified)
Comparison: 11/17/03.

CLINICAL DATA: Abdominal pain.  
 ABDOMEN CT WITHOUT CONTRAST:
TECHNIQUE: Multidetector CT imaging of the abdomen was performed following the standard protocol without IV contrast.
TECHNIQUE: Multidetector CT imaging of the pelvis was performed following the standard protocol without IV contrast.

[Series 3: abd/pelv w/o 5.0 b31f st · axial · non-contrast · 0.76mm/px · z∈[-778,-358]mm · 16 of 95 slices shown, 20 images]
[im 7/95  soft-tissue]
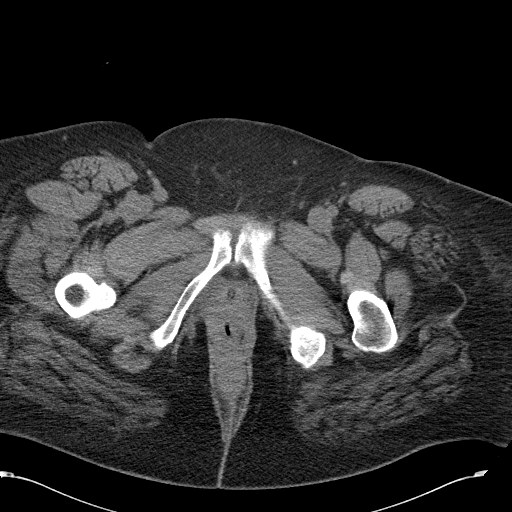
[im 7/95  bone]
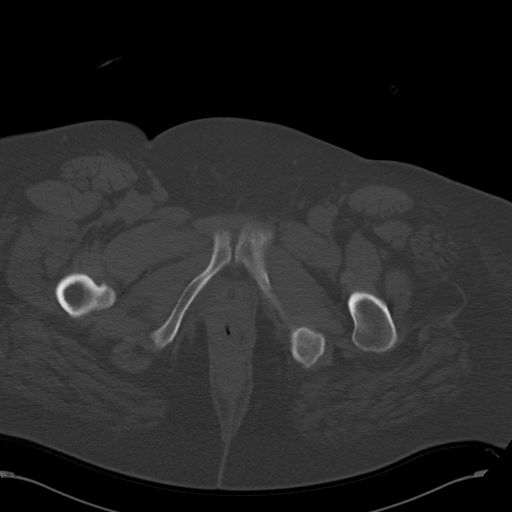
[im 13/95  soft-tissue]
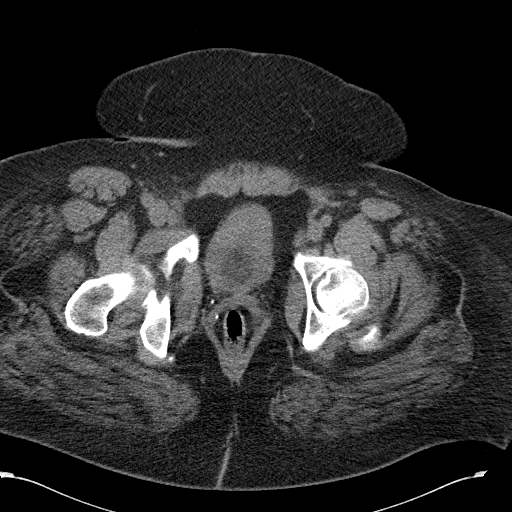
[im 19/95  soft-tissue]
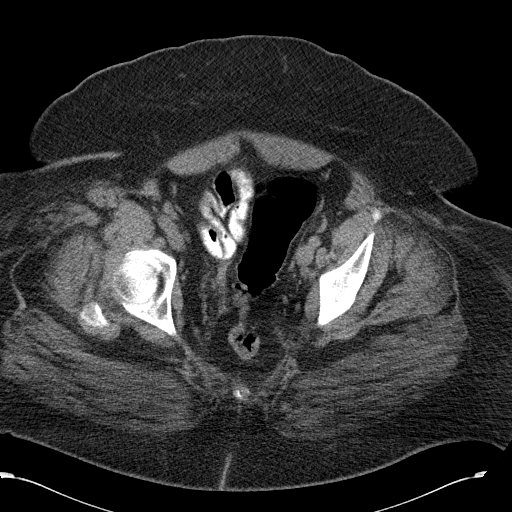
[im 25/95  soft-tissue]
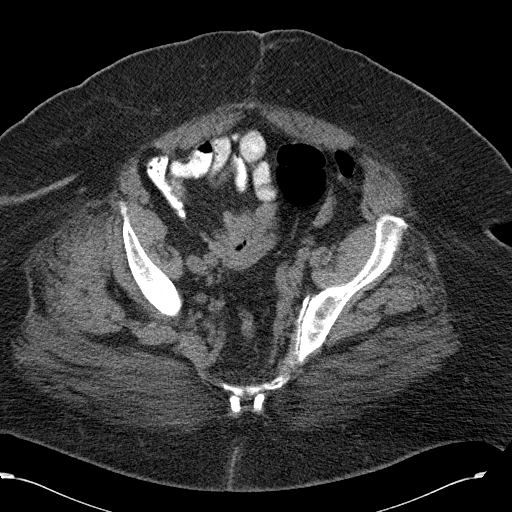
[im 31/95  soft-tissue]
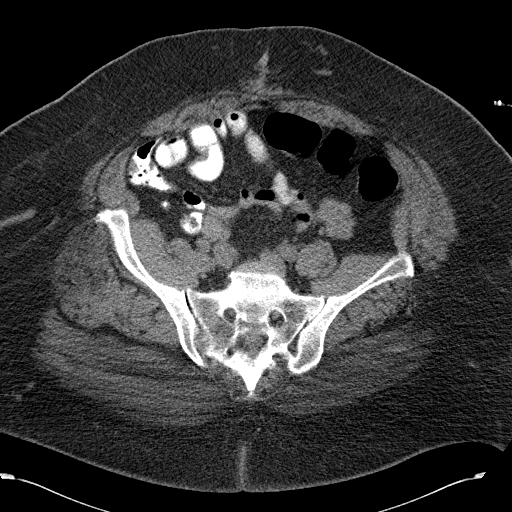
[im 37/95  soft-tissue]
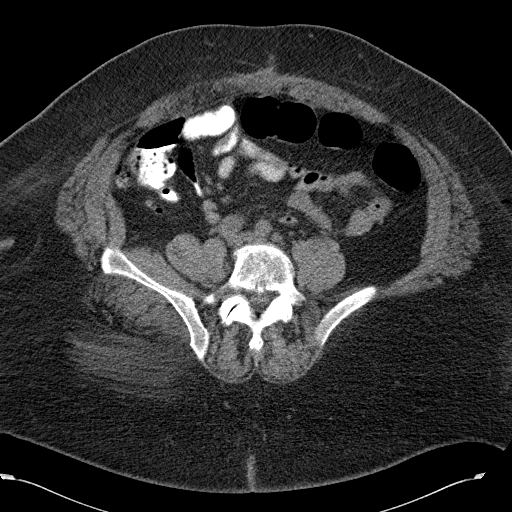
[im 43/95  soft-tissue]
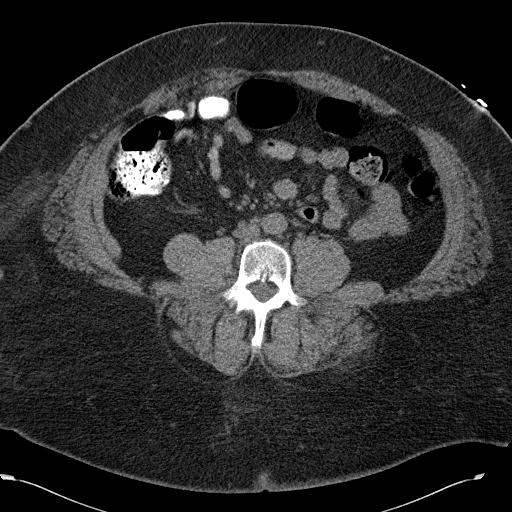
[im 52/95  soft-tissue]
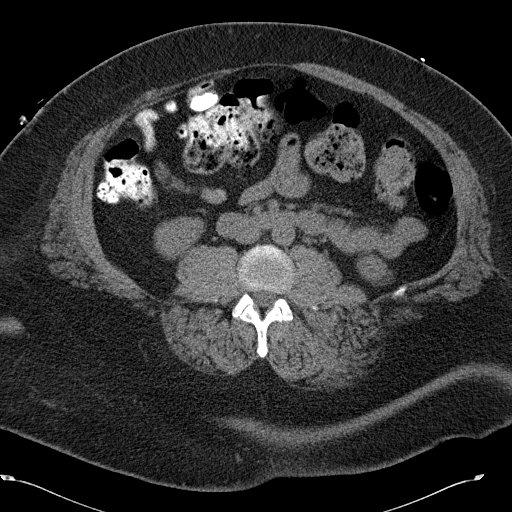
[im 58/95  soft-tissue]
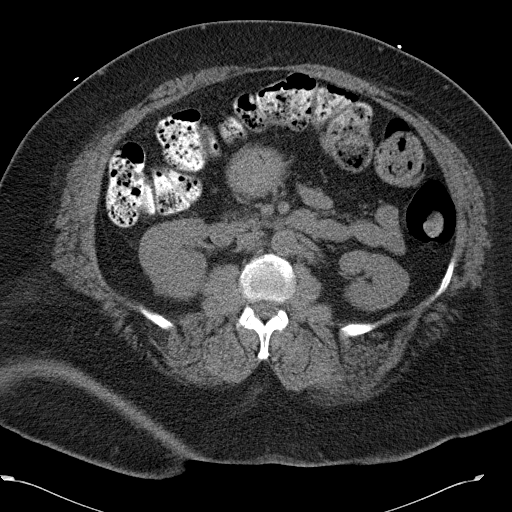
[im 58/95  bone]
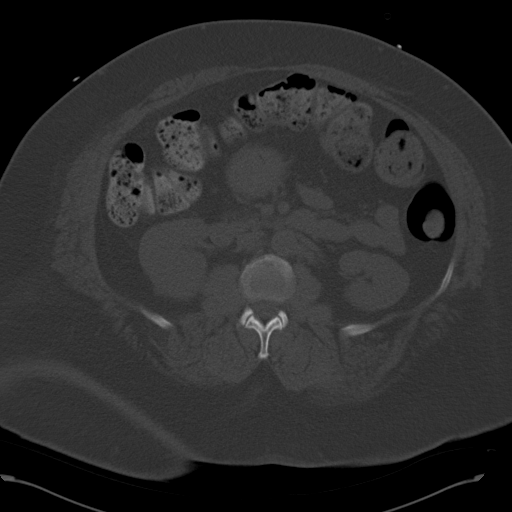
[im 64/95  soft-tissue]
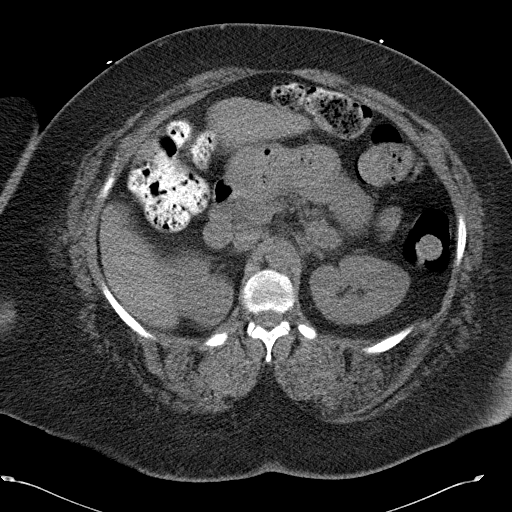
[im 70/95  soft-tissue]
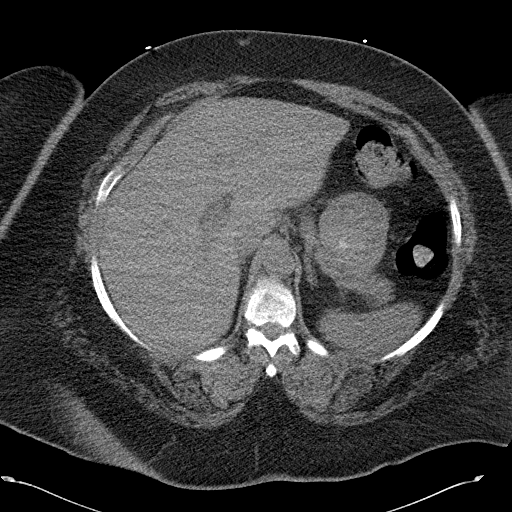
[im 76/95  soft-tissue]
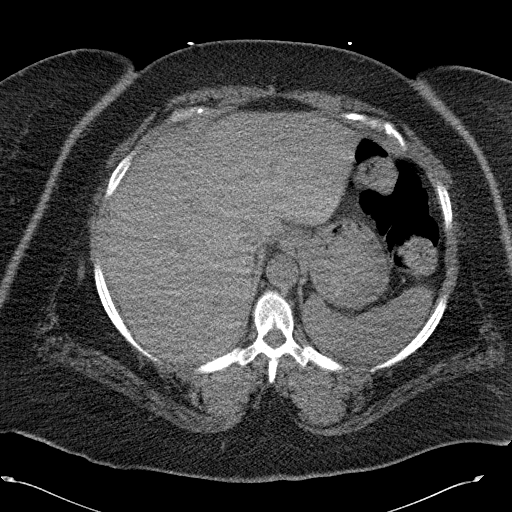
[im 82/95  soft-tissue]
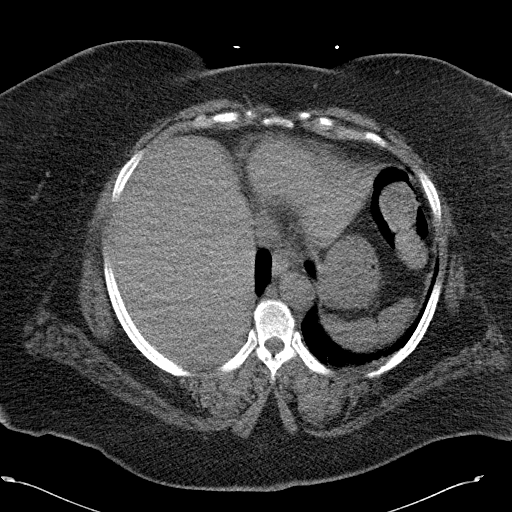
[im 82/95  lung]
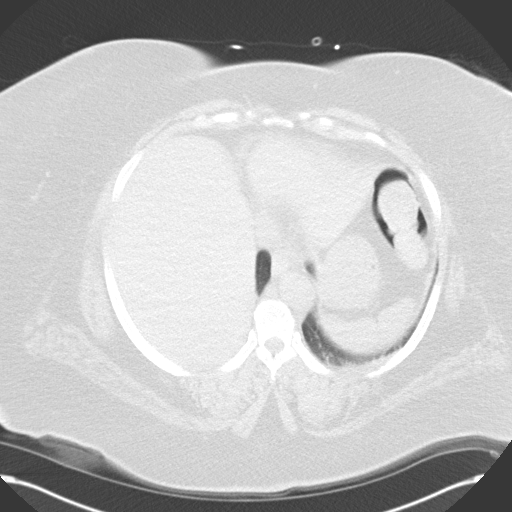
[im 85/95  lung]
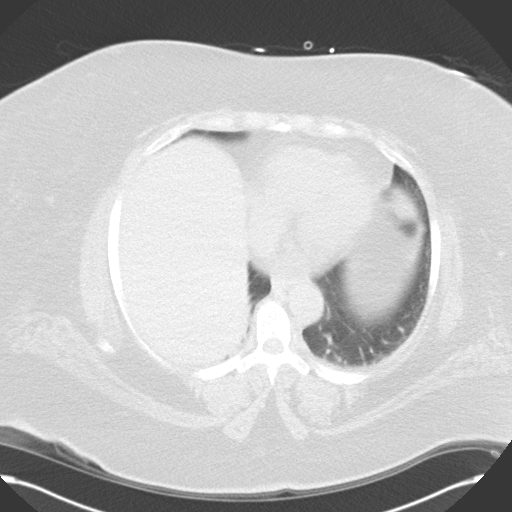
[im 88/95  soft-tissue]
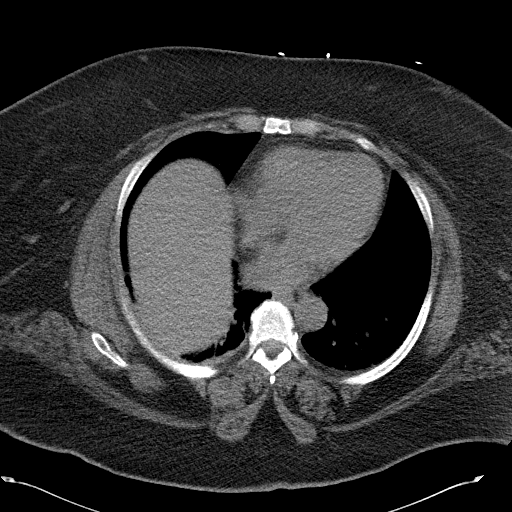
[im 88/95  lung]
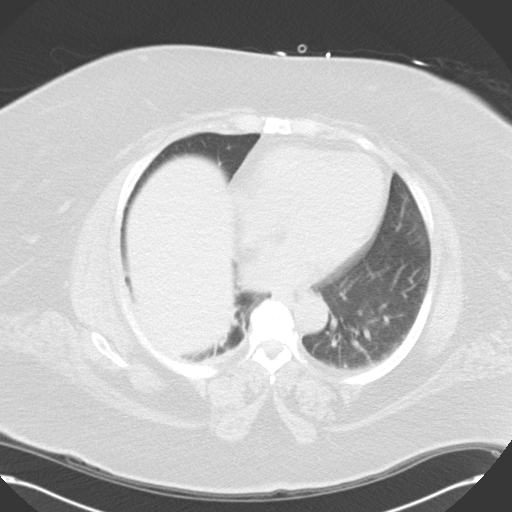
[im 91/95  lung]
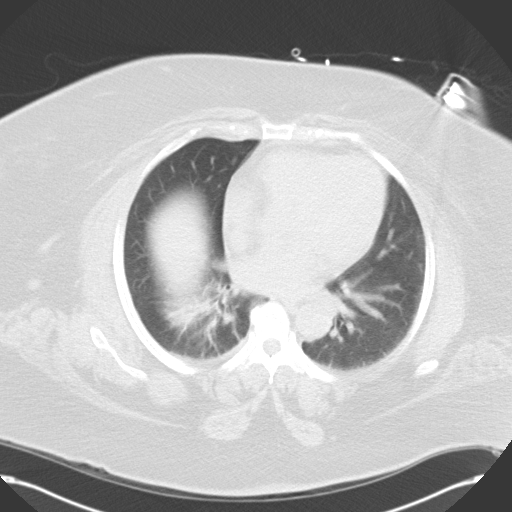

[16 of 32 positions shown; findings below may reference images not displayed]

FINDINGS: The patient is morbidly obese.  There is some chronic atelectasis at the right lung base.  The unenhanced liver, spleen, pancreas, adrenal glands, and kidneys demonstrate no acute abnormality.  There are no dilated loops of large or small bowel.  No free air or free fluid.  No significant adenopathy.  No significant bony abnormality.
IMPRESSION: No acute disease in the abdomen.  Gallbladder has been removed.  
 PELVIS CT WITHOUT CONTRAST:
FINDINGS: The terminal ileum and appendix appear normal.  Uterus has been removed.  I see both ovaries and they appear normal.  There is no diverticular disease of significance.  
 Foley catheter is in the bladder.
IMPRESSION: No acute abnormality.  Specifically, no diverticulitis.

## 2008-06-13 ENCOUNTER — Ambulatory Visit (HOSPITAL_COMMUNITY): Admission: RE | Admit: 2008-06-13 | Discharge: 2008-06-13 | Payer: Self-pay | Admitting: Internal Medicine

## 2008-06-25 IMAGING — CR DG CHEST 1V PORT
1 series · 1 of 1 positions shown · non-contrast
Comparison: 05/12/07.

CLINICAL DATA: Shortness of breath. 
 PORTABLE CHEST - 1 VIEW ? 06/18/07:

[AP]
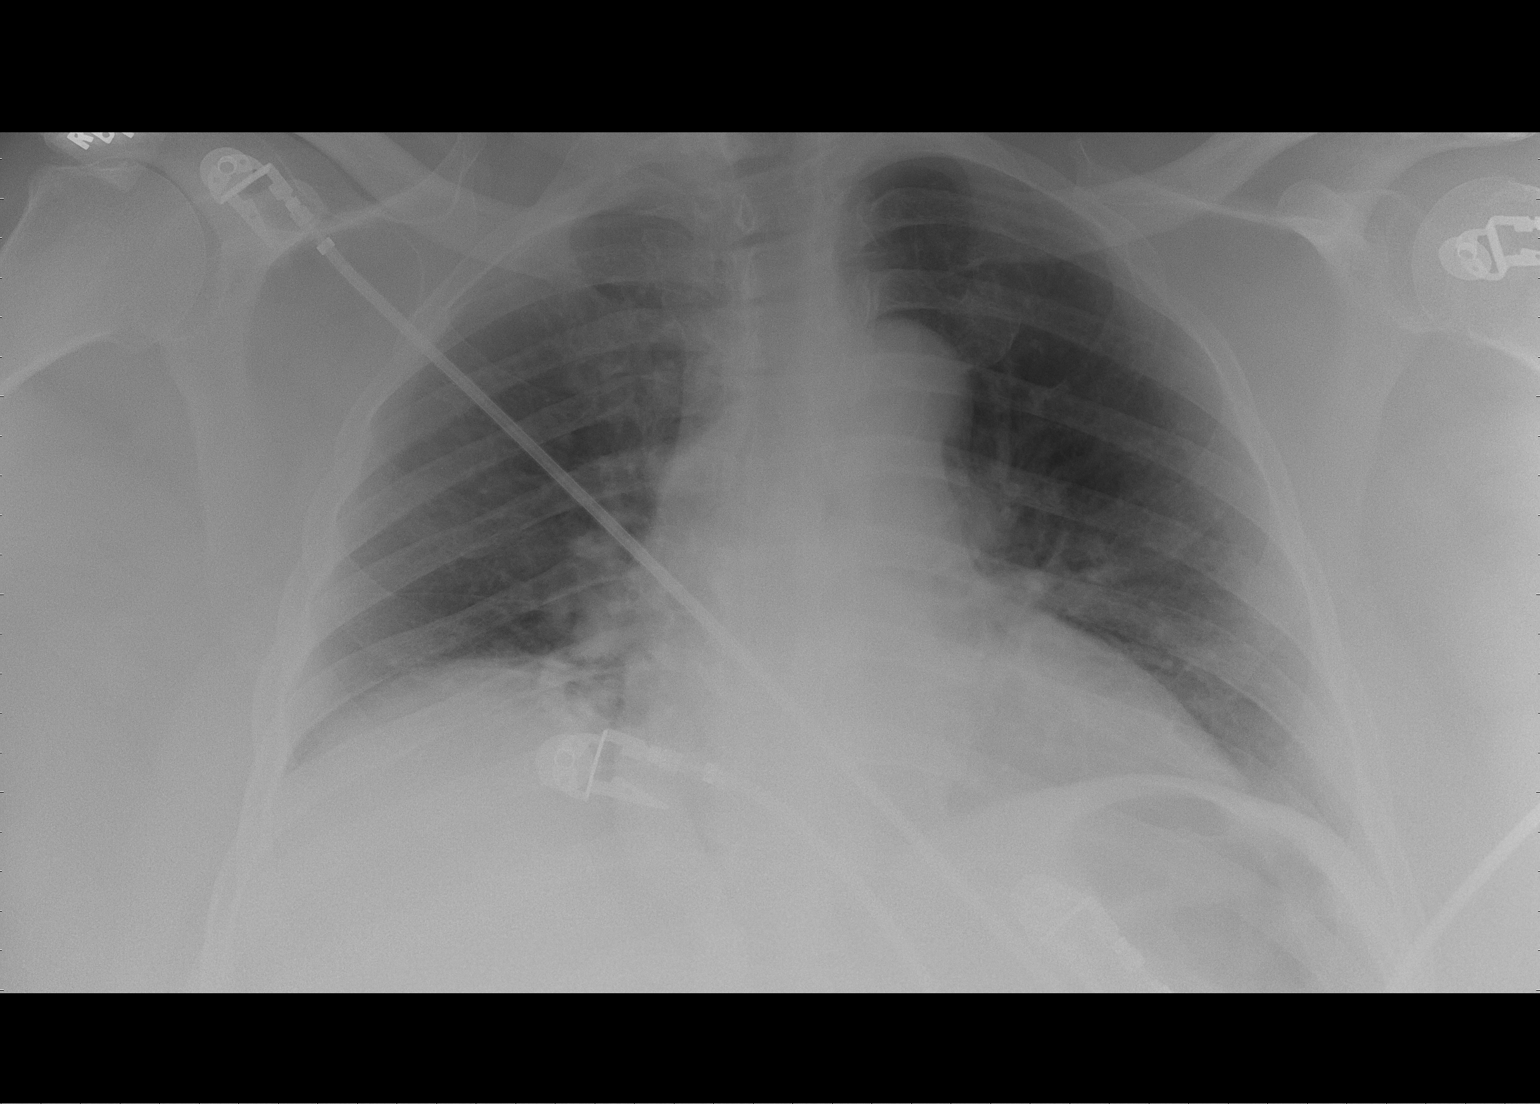

[1 of 1 positions shown; findings below may reference images not displayed]

FINDINGS: Heart size at the upper limits of normal.   Fine detail is obscured by patient body habitus. Lung volumes are low.   No focal pulmonary opacity.
IMPRESSION: Low lung volumes, no acute focal process.

## 2008-07-10 IMAGING — CR DG ABDOMEN ACUTE W/ 1V CHEST
6 series · 6 of 6 positions shown · non-contrast
Comparison: None.

This report is delayed due to PACS failure.
CLINICAL DATA: Nausea, vomiting, diarrhea, and abdominal pain. 
 ACUTE ABDOMINAL SERIES ? 3 VIEW:

[w abdomen decub * (1 of 3)]
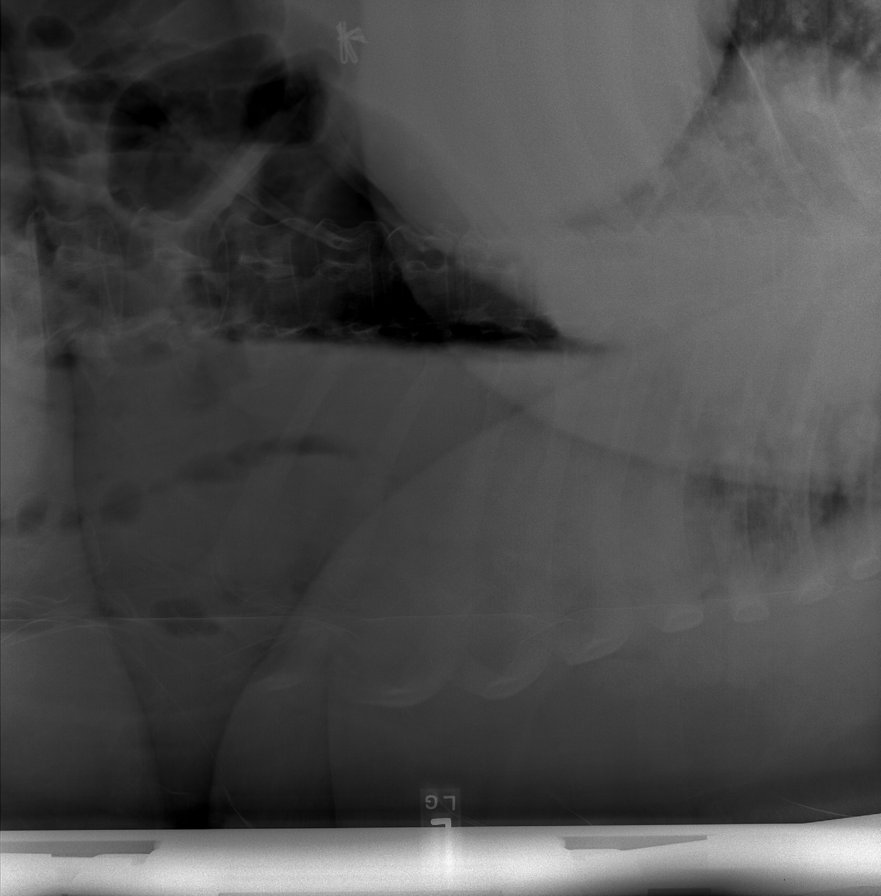

[w abdomen decub * (2 of 3)]
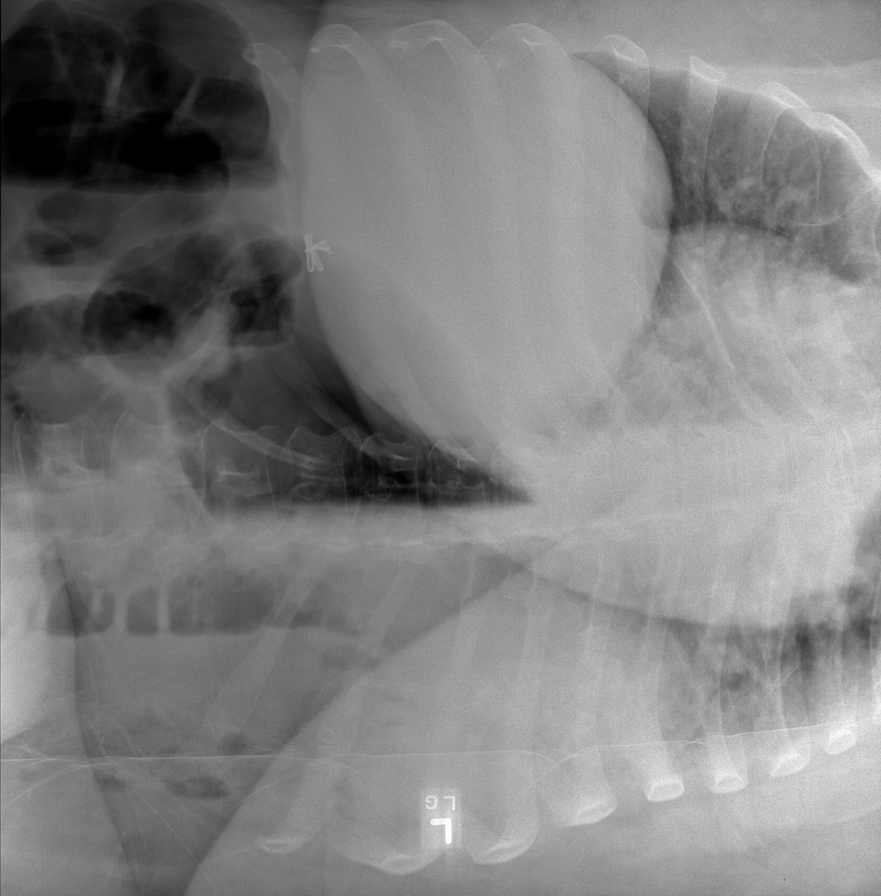

[w abdomen decub * (3 of 3)]
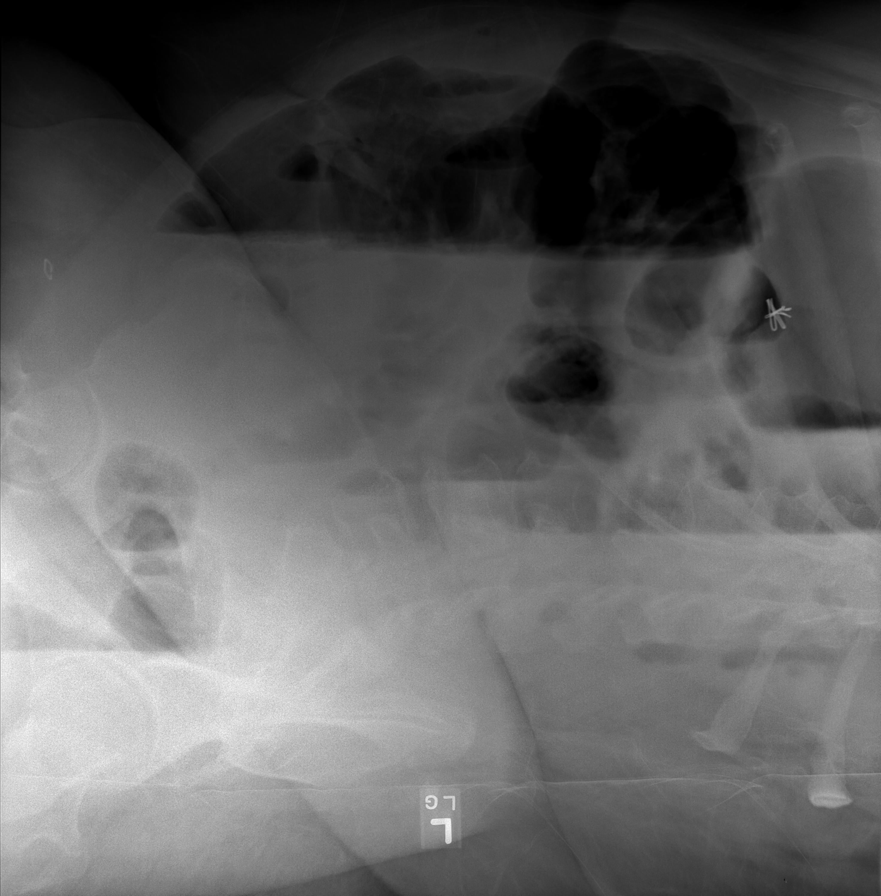

[t abdomen supine * (1 of 2)]
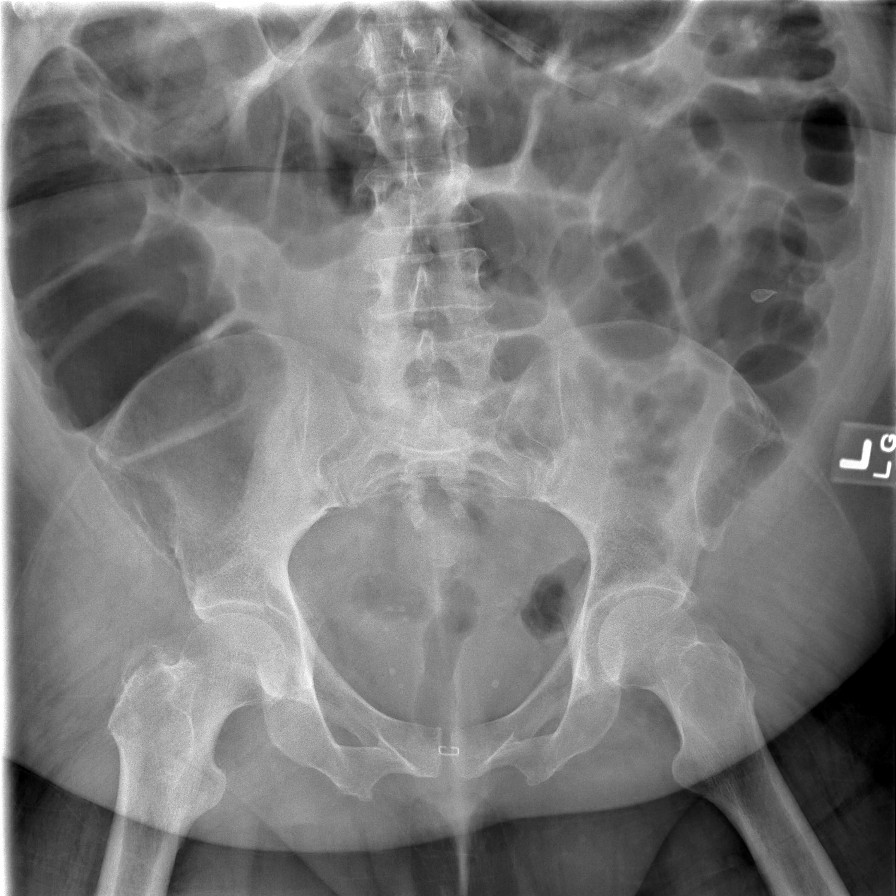

[t abdomen supine * (2 of 2)]
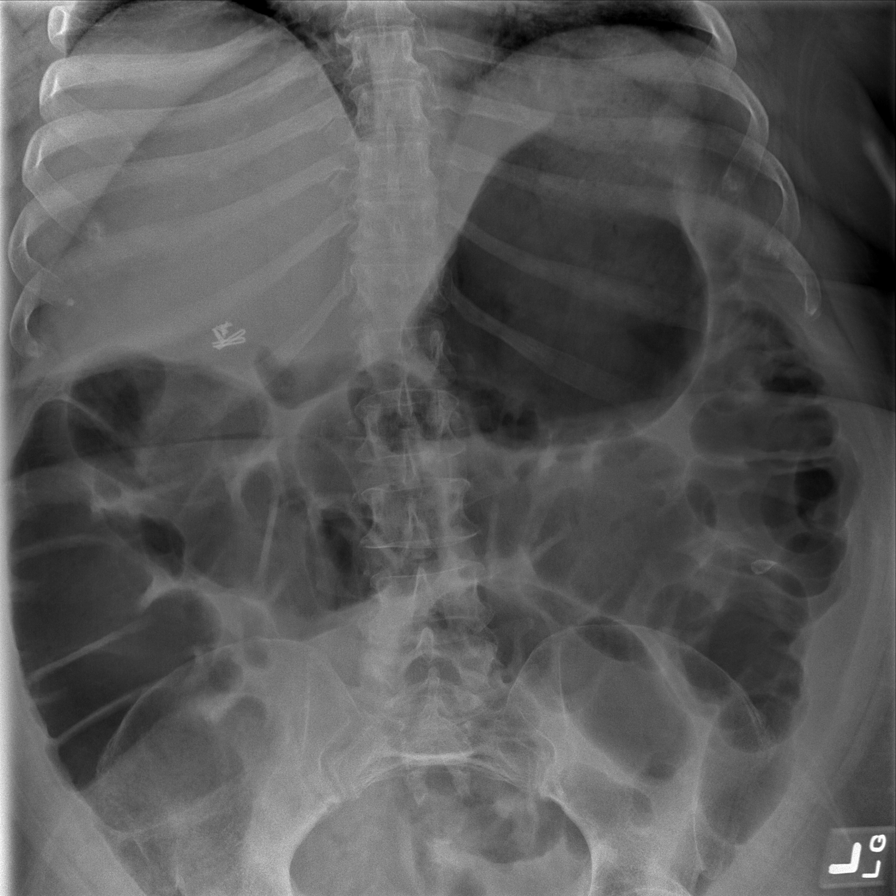

[t chest supine]
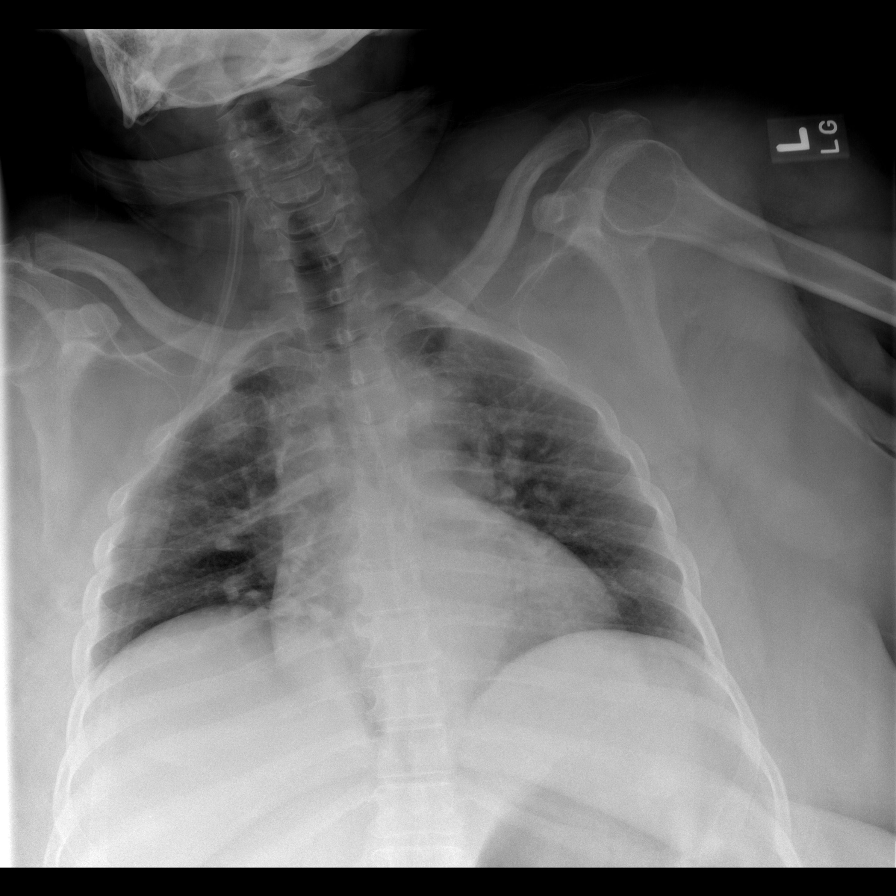

[6 of 6 positions shown; findings below may reference images not displayed]

FINDINGS: The chest x-ray is markedly under inspired.  There is no definite acute cardiopulmonary process. 
 Abdominal gas pattern shows generalized dilatation of large and small bowel loops without obvious free air or acute/specific pattern.  There do appear to be short segment air fluid levels that are most likely due to an adynamic ileus.  
 There are prior cholecystectomy clips.
IMPRESSION: 1.  Suboptimal level of inspiration-no definite acute cardiopulmonary process. 
 2.  No acute or specific findings-the bowel gas pattern is most consistent with a nonobstructive ileus.

## 2008-07-13 IMAGING — CR DG CHEST 1V PORT
1 series · 1 of 1 positions shown · non-contrast
Comparison: 06/18/2007.

CLINICAL DATA: Right PICC line placement.

PORTABLE CHEST - 1 VIEW

[view not recorded]
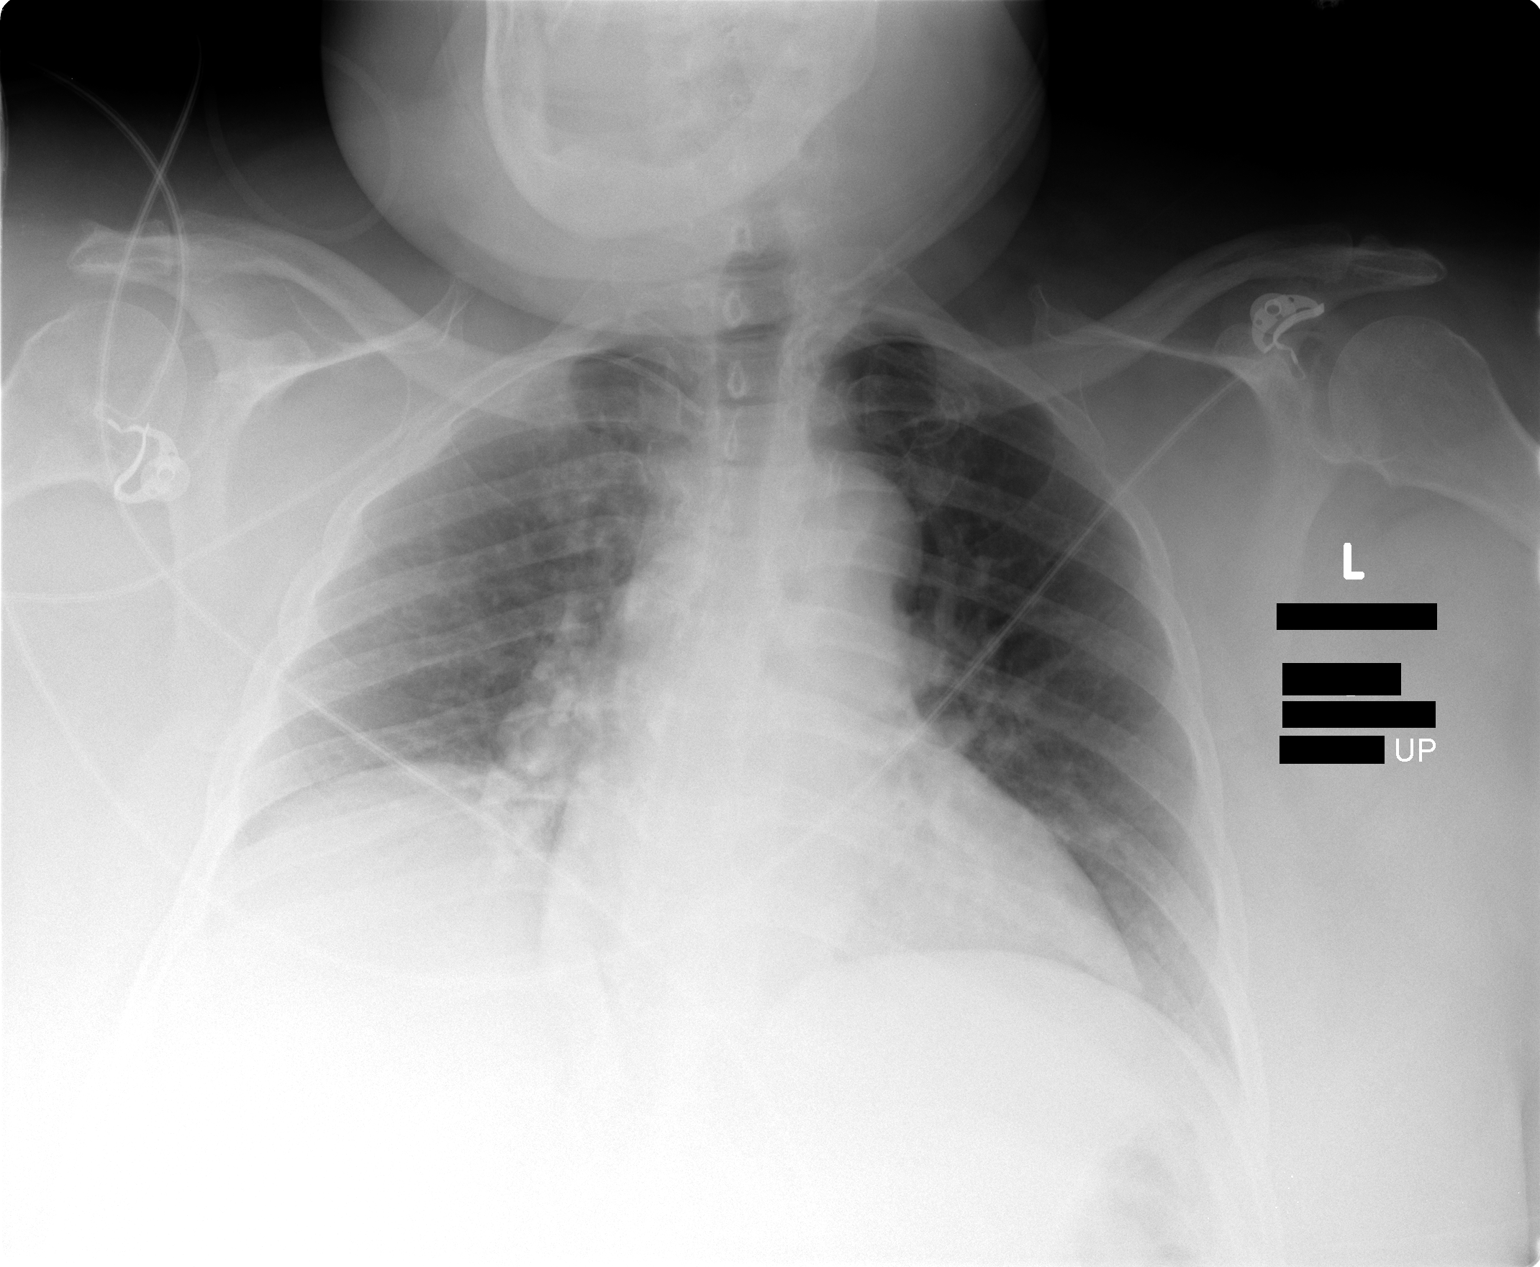

[1 of 1 positions shown; findings below may reference images not displayed]

FINDINGS: Stable enlarged cardiac silhouette and clear lungs. Stable mildly
elevated right hemidiaphragm or right diaphragmatic eventration. Interval right
PICC with its tip in the right innominate vein. Unremarkable bones.  

IMPRESSION

Right PICC tip in the right innominate vein. This needs to be advanced
approximately 10 cm.

## 2008-08-18 IMAGING — CT CT ANGIO CHEST
2 of 11 series · 12 of 36 positions shown · IV contrast (APPLIED)
Comparison: None

CLINICAL DATA: Chest pain, elevated d-dimer

CT ANGIOGRAPHY OF CHEST - PULMONARY EMBOLISM PROTOCOL:
TECHNIQUE: Multidetector CT imaging of the chest was performed according to the
protocol for detection of pulmonary embolism during bolus injection of
intravenous contrast.  Coronal and sagittal plane CT angiographic image
reconstructions were also generated.
Contrast:  120 cc Omnipaque 300

[Series 16: pulm embolism 1.0 b25f thins · axial · 0.79mm/px · z∈[-270,-90]mm · 11 of 218 slices shown]
[im 19/218  lung]
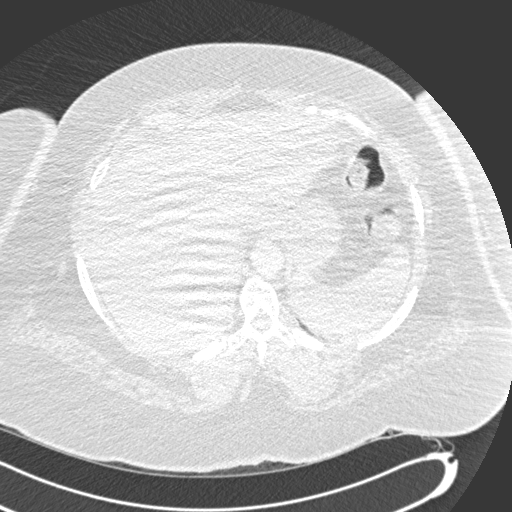
[im 37/218  mediastinal]
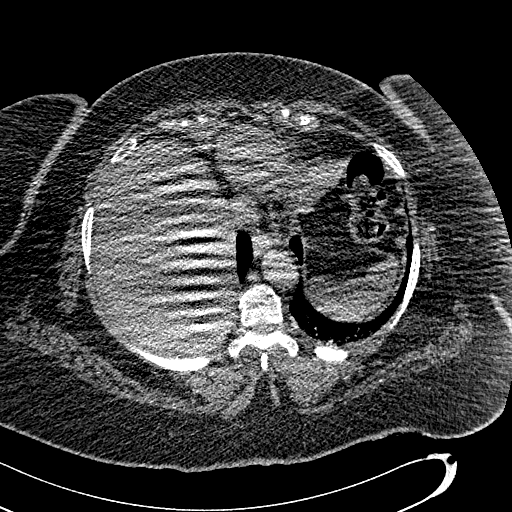
[im 55/218  lung]
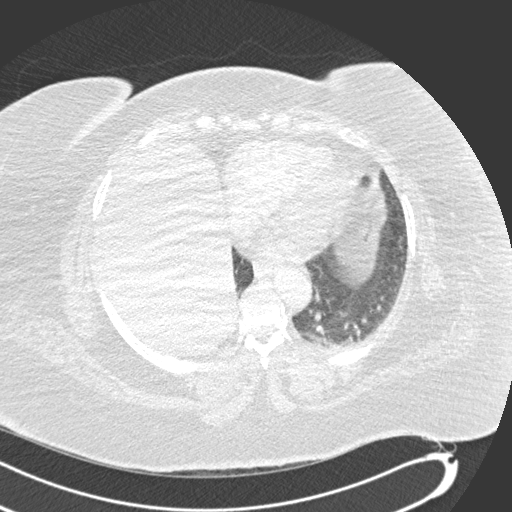
[im 73/218  mediastinal]
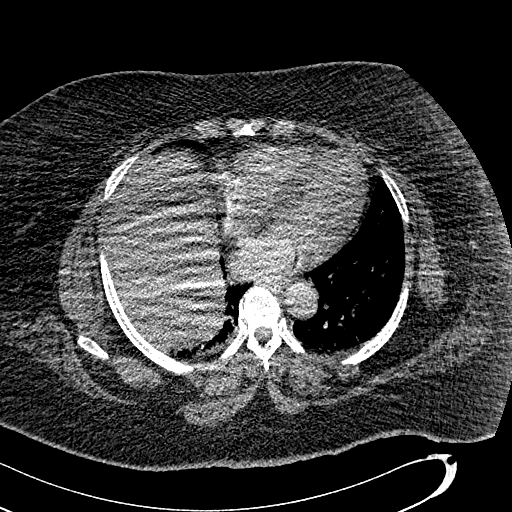
[im 91/218  lung]
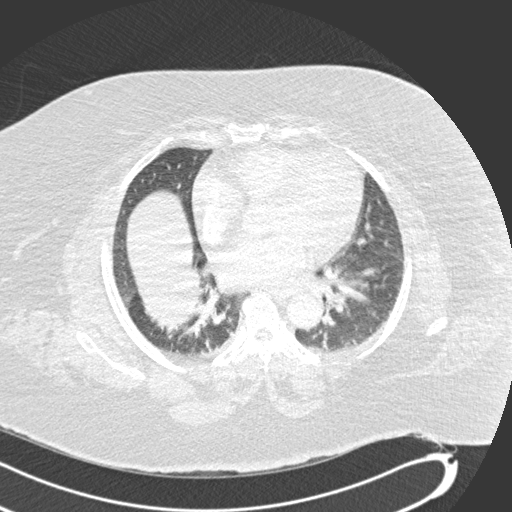
[im 109/218  mediastinal]
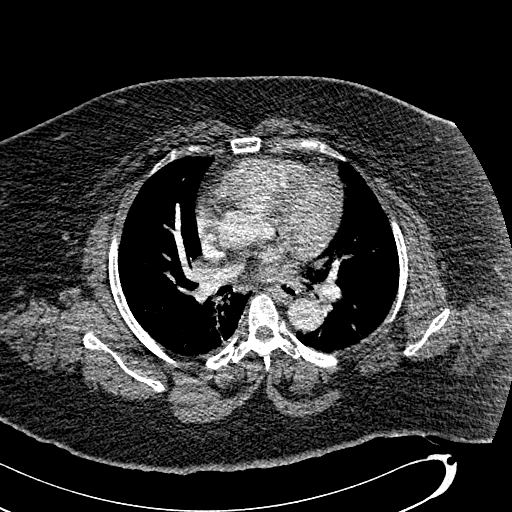
[im 127/218  lung]
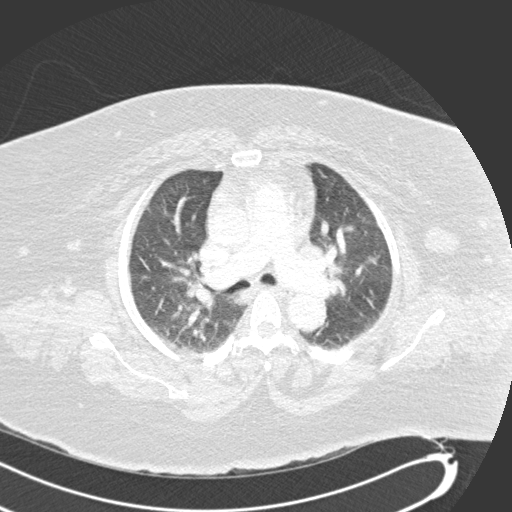
[im 145/218  mediastinal]
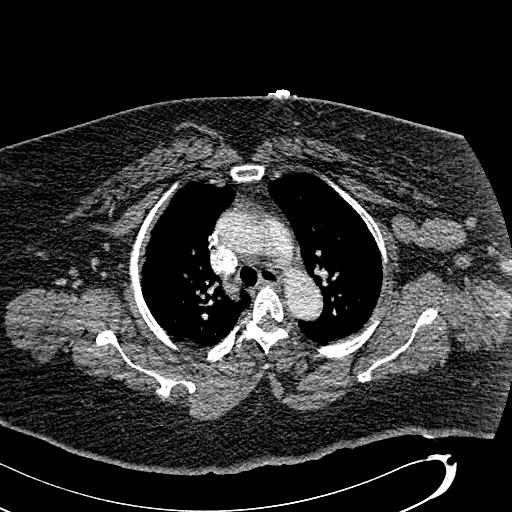
[im 163/218  lung]
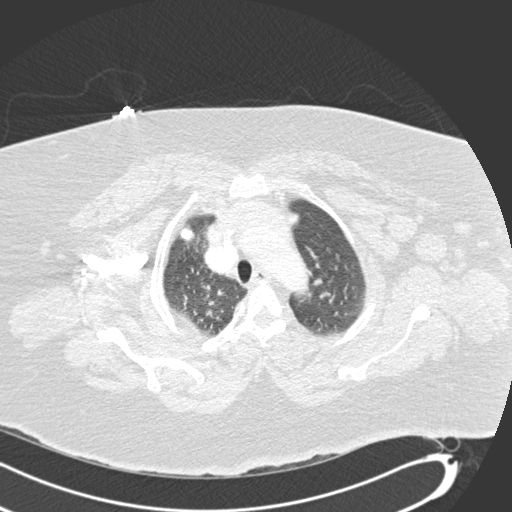
[im 181/218  mediastinal]
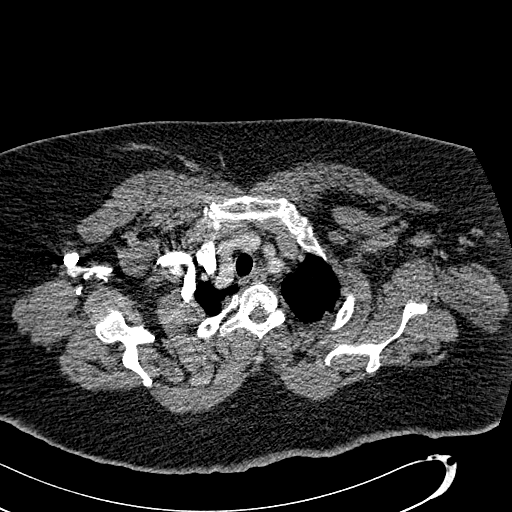
[im 199/218  lung]
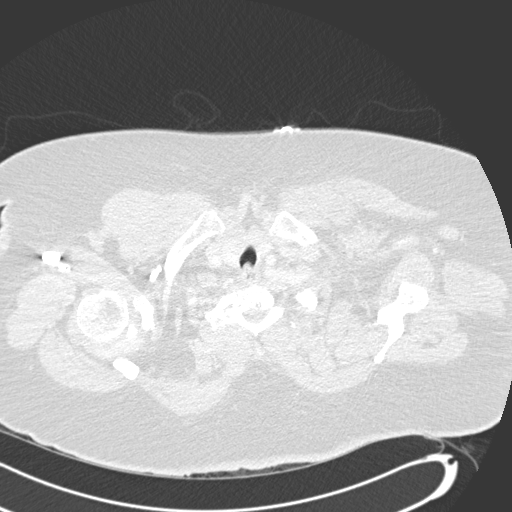

[Series 602: cor · coronal · 0.79mm/px · 1 of 114 slices shown]
[im 57/114  mediastinal]
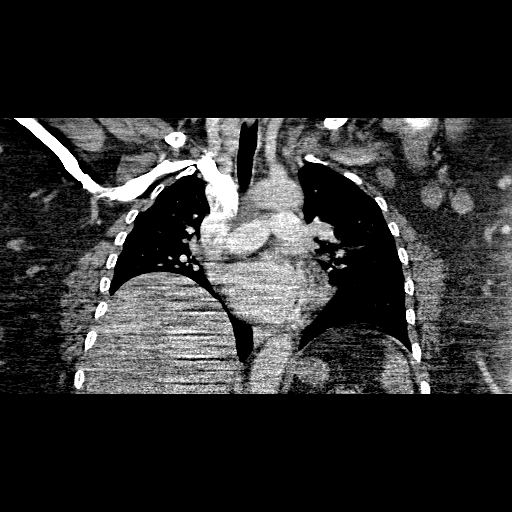

[12 of 36 positions shown; findings below may reference images not displayed]

FINDINGS: Initial pulmonary arterial opacification initially was poor. The study
was repeated multiple times, with only moderate opacification of the pulmonary
arteries on the best series. Pulmonary arteries in the lung bases remain poorly
opacified.

No large or central pulmonary emboli visualized. Heart is borderline enlarged.
Aorta is normal caliber.

Multiple enlarged left axillary lymph nodes. Index node measures 4.1 x 3.0 cm on
image 27. No right axillary adenopathy, mediastinal, or hilar adenopathy.

There is bibasilar atelectasis. Mild vascular congestion noted.
IMPRESSION: Study somewhat suboptimal to assess for pulmonary emboli despite multiple
attempts. No large or central pulmonary overlies visualized.

Left axillary adenopathy. Recommend correlation with mammography to exclude
breast cancer.

Bibasilar atelectasis.

Borderline heart size with vascular congestion.

## 2008-11-13 ENCOUNTER — Inpatient Hospital Stay (HOSPITAL_COMMUNITY): Admission: EM | Admit: 2008-11-13 | Discharge: 2008-11-18 | Payer: Self-pay | Admitting: *Deleted

## 2008-11-13 ENCOUNTER — Encounter (INDEPENDENT_AMBULATORY_CARE_PROVIDER_SITE_OTHER): Payer: Self-pay | Admitting: Cardiology

## 2008-11-18 ENCOUNTER — Encounter (INDEPENDENT_AMBULATORY_CARE_PROVIDER_SITE_OTHER): Payer: Self-pay | Admitting: Internal Medicine

## 2008-11-18 ENCOUNTER — Encounter (INDEPENDENT_AMBULATORY_CARE_PROVIDER_SITE_OTHER): Payer: Self-pay | Admitting: Interventional Radiology

## 2008-12-02 ENCOUNTER — Encounter: Admission: RE | Admit: 2008-12-02 | Discharge: 2008-12-02 | Payer: Self-pay | Admitting: Internal Medicine

## 2009-01-06 ENCOUNTER — Ambulatory Visit (HOSPITAL_COMMUNITY): Admission: RE | Admit: 2009-01-06 | Discharge: 2009-01-06 | Payer: Self-pay | Admitting: Internal Medicine

## 2009-02-13 IMAGING — CR DG CHEST 2V
1 series · 1 of 1 positions shown · non-contrast
Comparison: 08/10/2007

CLINICAL DATA: Chest pain.  Short of breath.

CHEST - 2 VIEW

[view not recorded]
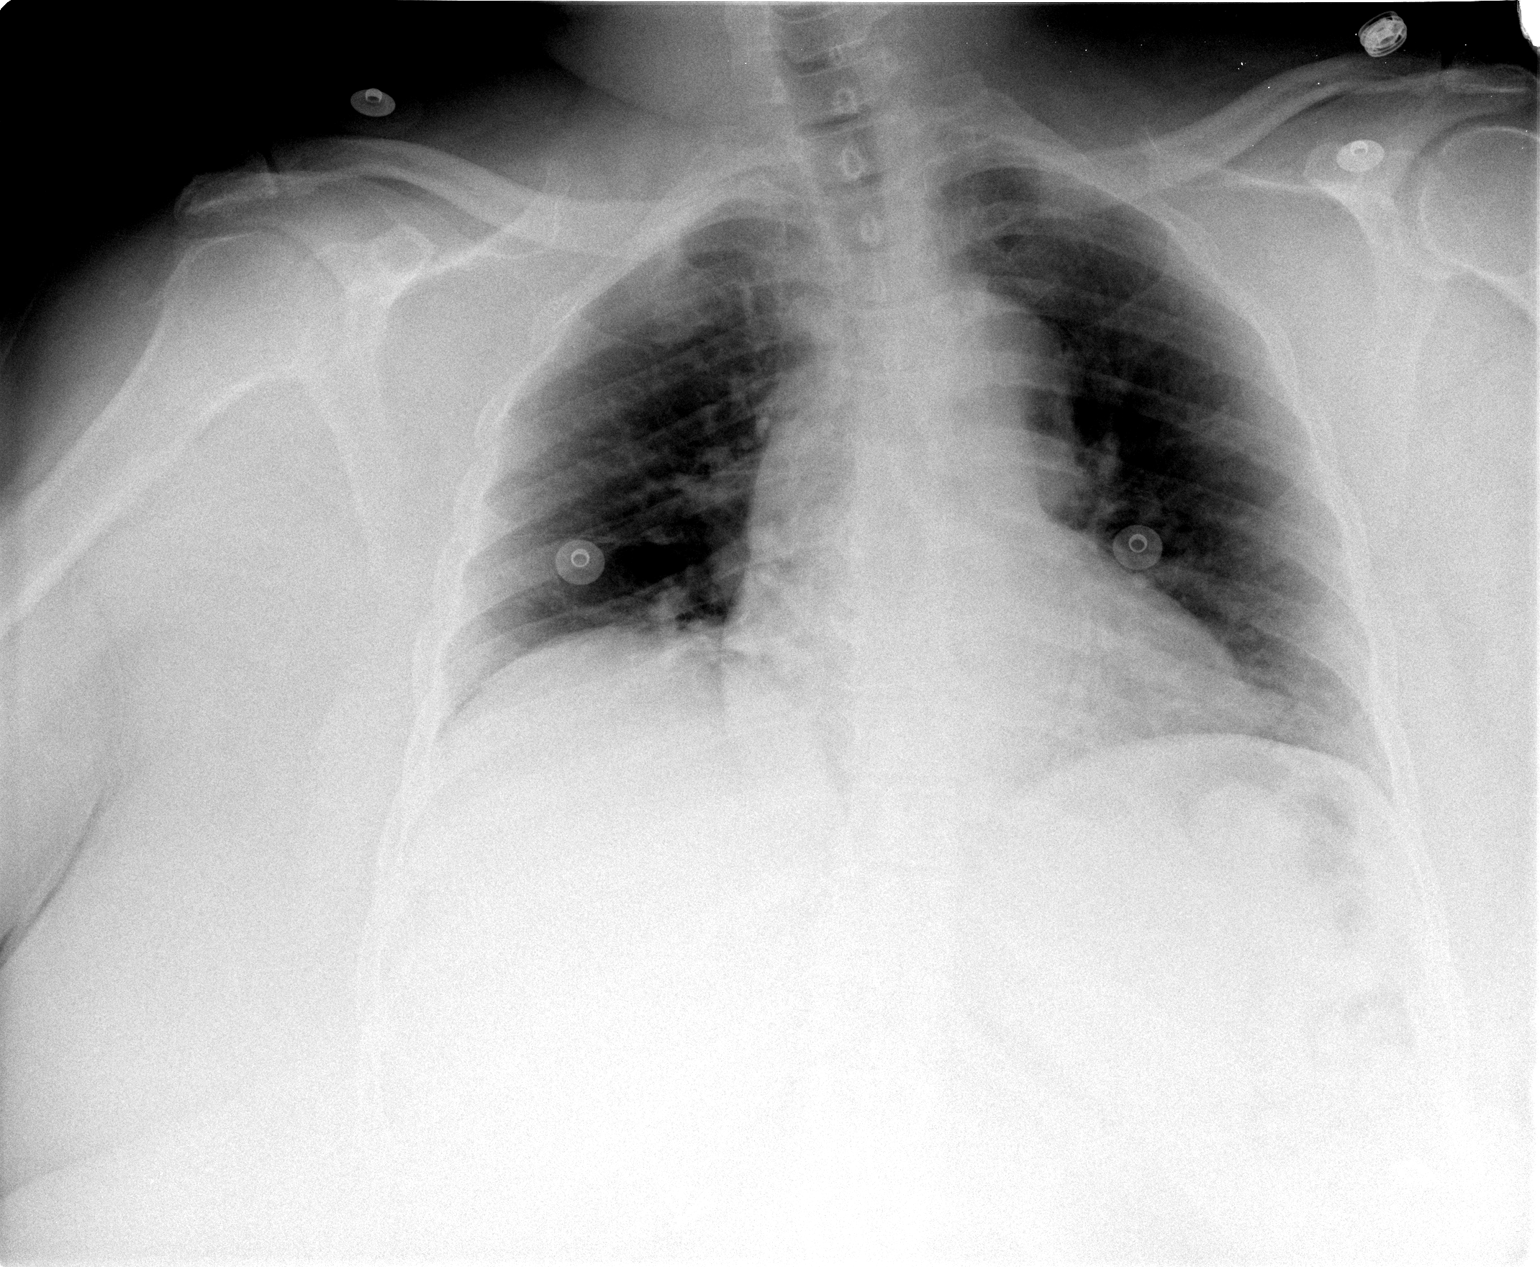

[1 of 1 positions shown; findings below may reference images not displayed]

FINDINGS: Artifact overlies chest.  Heart size is normal.  The
patient has taken a poor inspiration.  There is mild volume loss at
the bases, but this may simply relate to the poor respiratory
effort.  Upper lobes are clear.  No effusions.  Bony structures
unremarkable.
IMPRESSION: Poor inspiration.  Some hypoaerative changes at the bases that
could relate to the poor inspiration or could represent true
atelectasis.

## 2009-02-13 IMAGING — CR DG THORACIC SPINE 2V
3 series · 3 of 3 positions shown · non-contrast
Comparison: None

CLINICAL DATA: Chest pain, mid and lower back pain, status post
fall

THORACIC SPINE - 3 VIEWS:

[t t-spine a.p.]
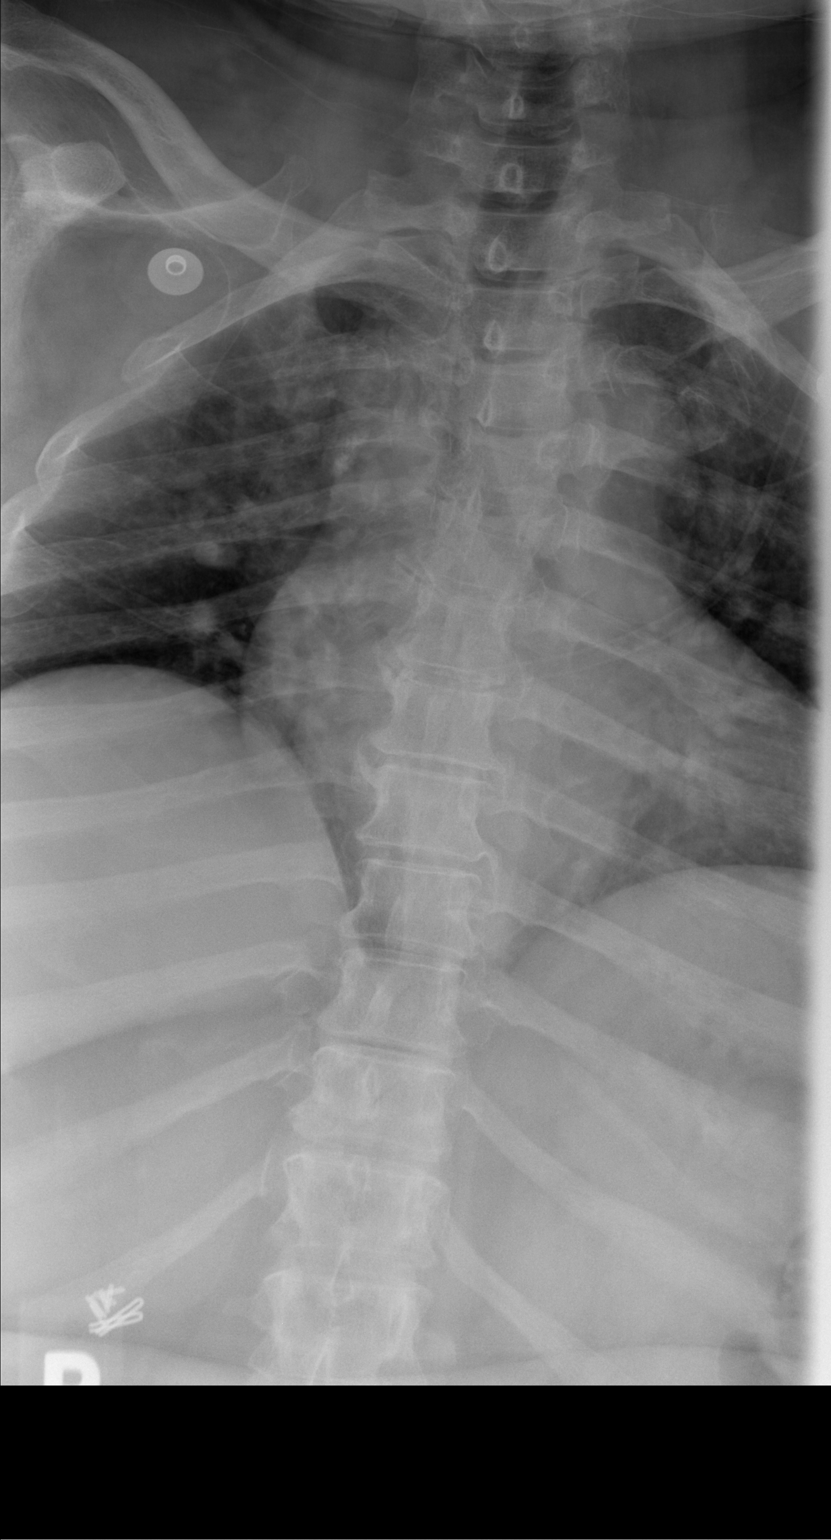

[t t-spine lat]
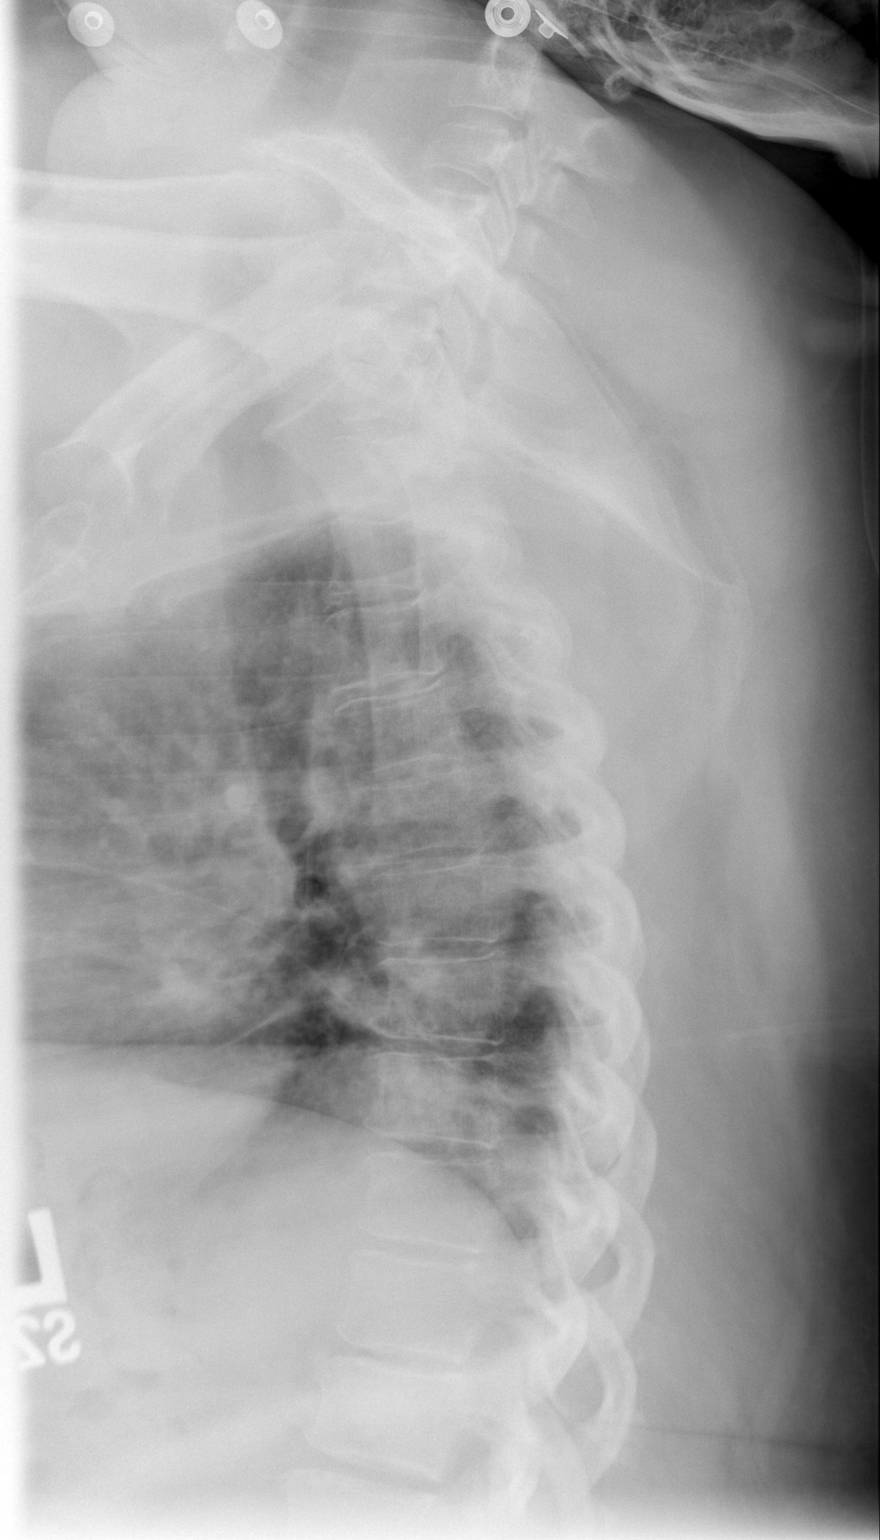

[t swimmers *]
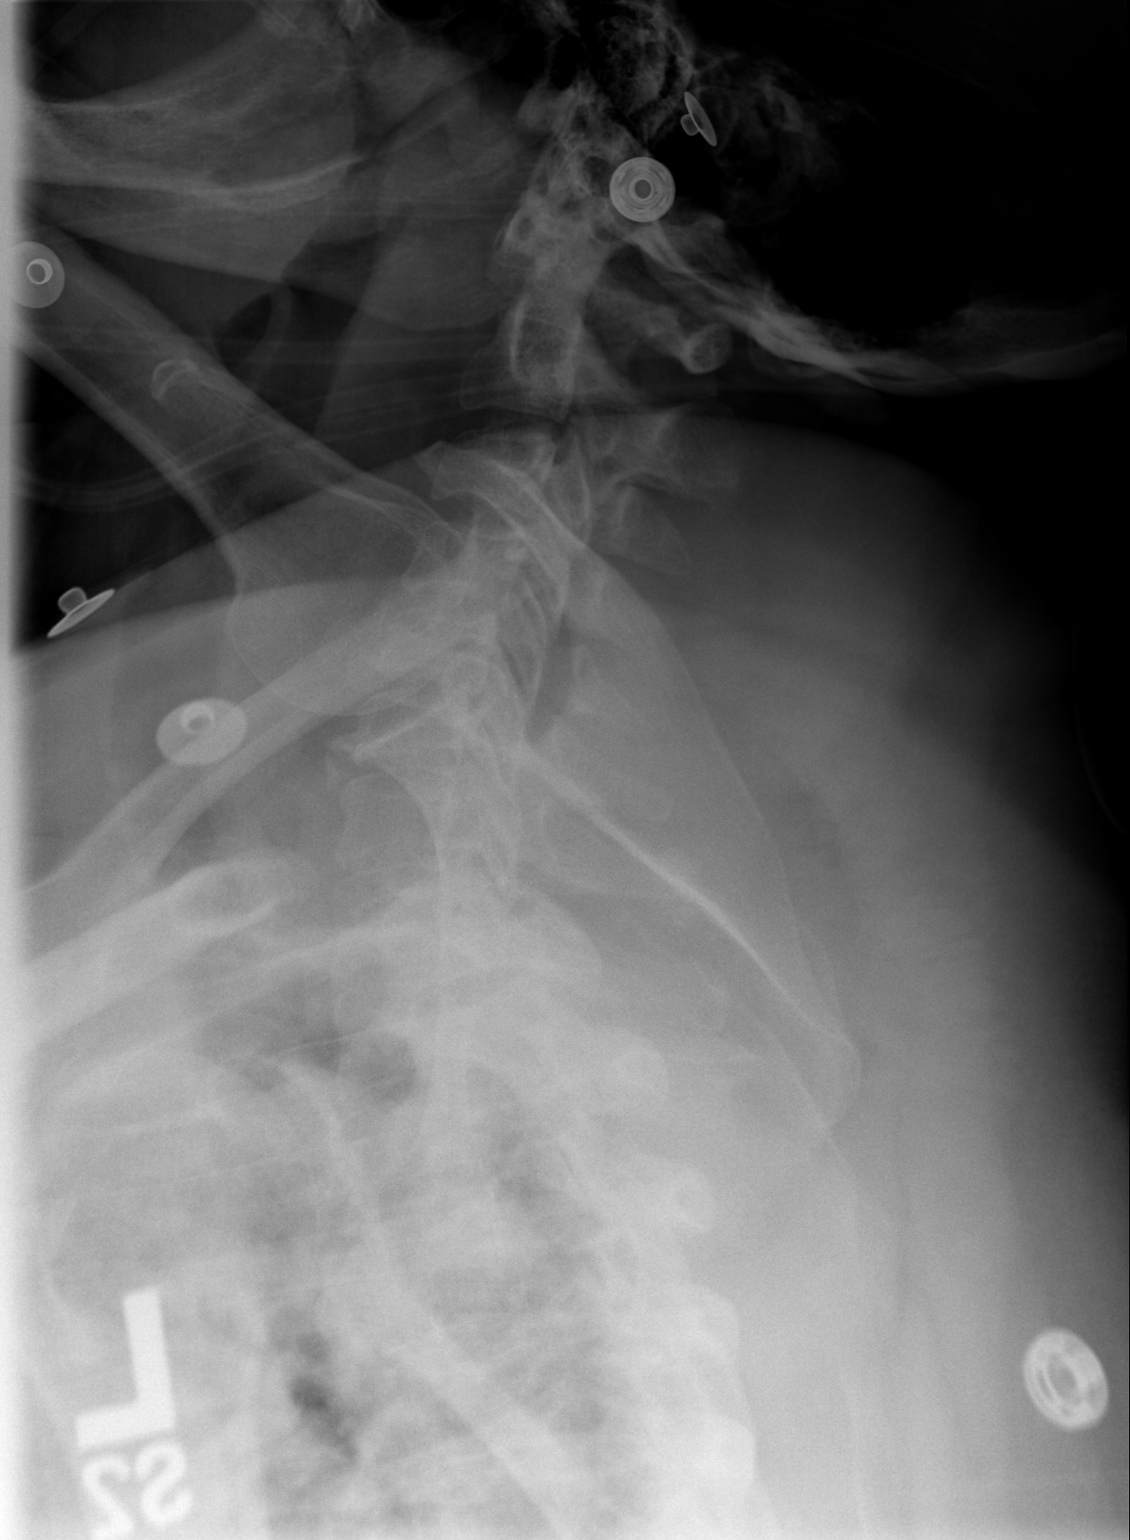

[3 of 3 positions shown; findings below may reference images not displayed]

FINDINGS: 12 pairs of ribs.
Diffuse bony demineralization.
Vertebral body and disc space heights maintained.
No fracture, subluxation, or bone destruction.
IMPRESSION: Bony demineralization.
No acute thoracic spine abnormalities.

## 2009-02-13 IMAGING — CR DG LUMBAR SPINE COMPLETE 4+V
5 series · 5 of 5 positions shown · non-contrast
Comparison: None

CLINICAL DATA: Mid and lower back pain, status post fall

LUMBAR SPINE - COMPLETE 4+ VIEW

[t l-spine a.p.]
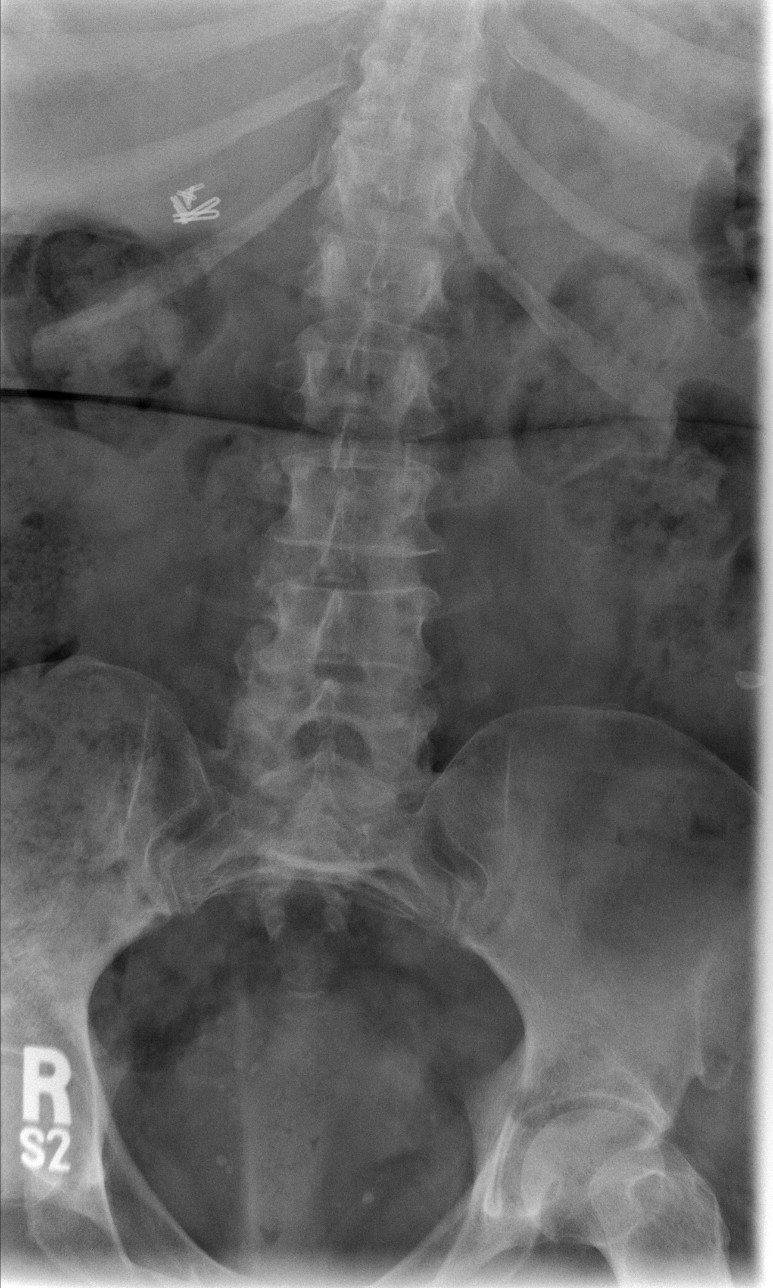

[t l-spine oblique exposure (1 of 2)]
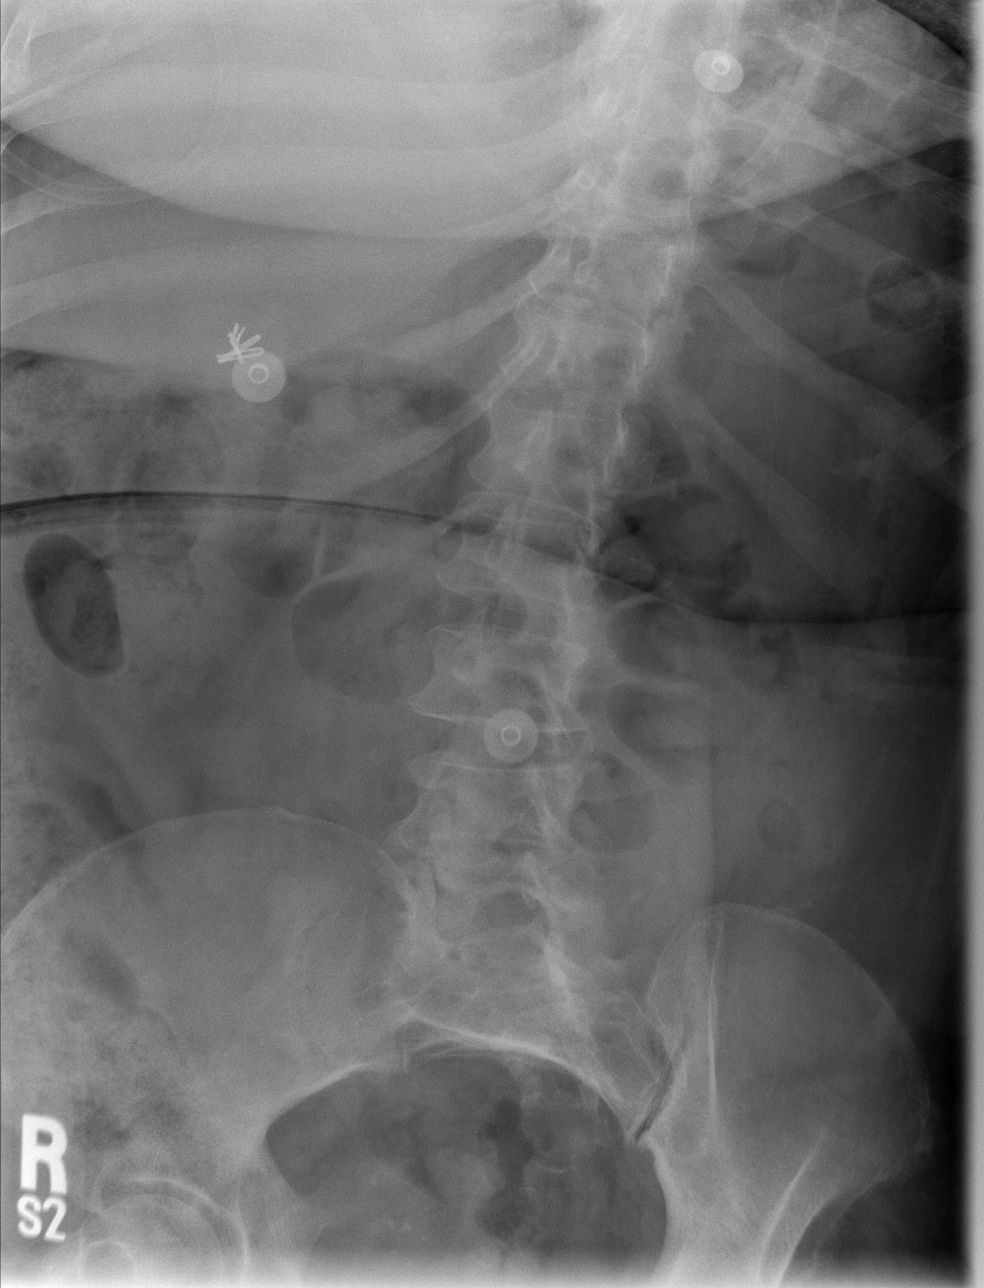

[t l-spine oblique exposure (2 of 2)]
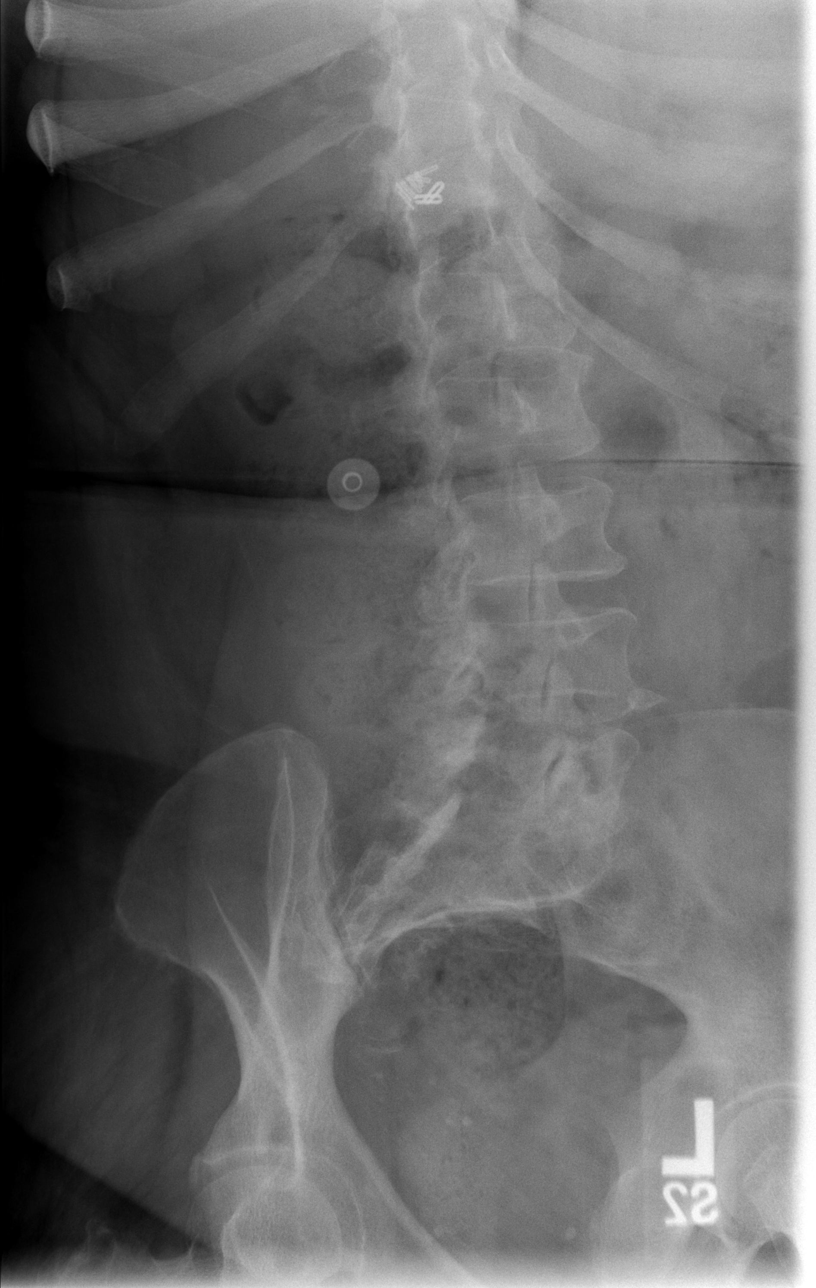

[t l-spine lat *]
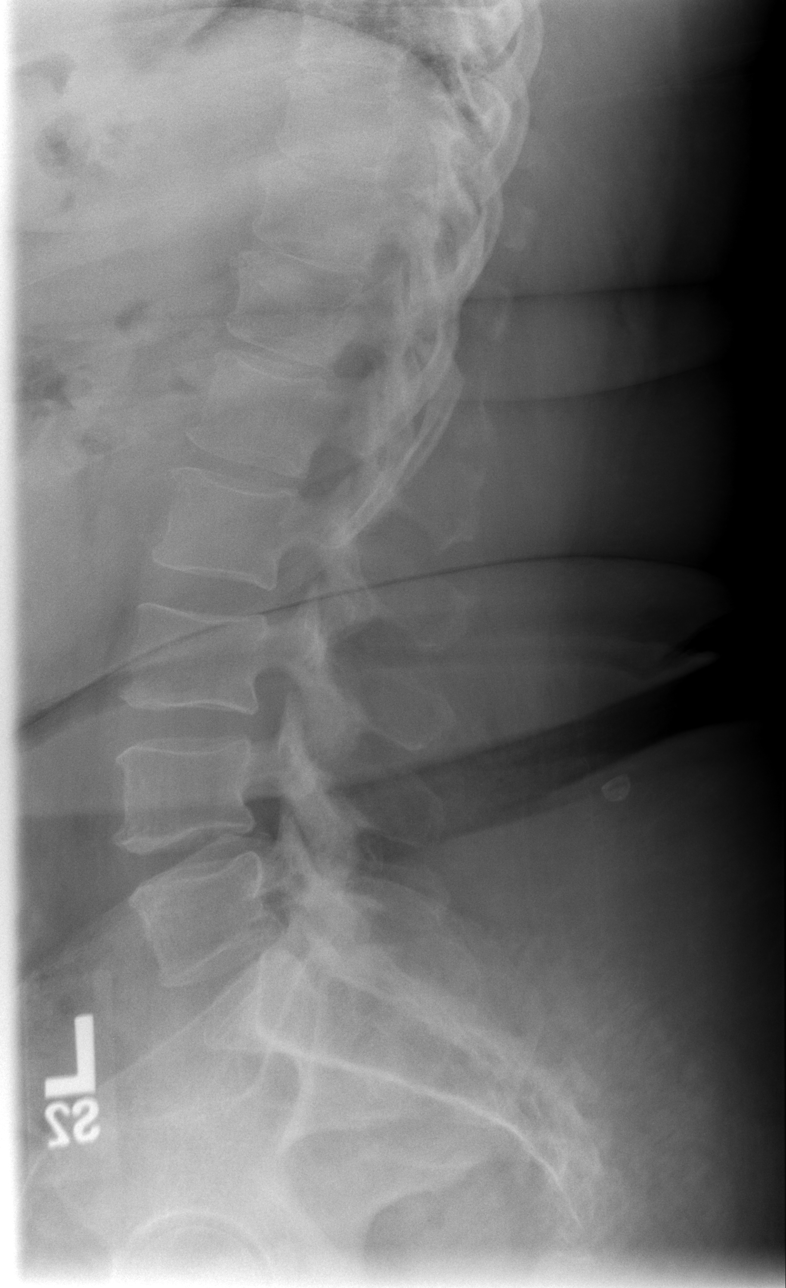

[t l-spine l5-s1 spot]
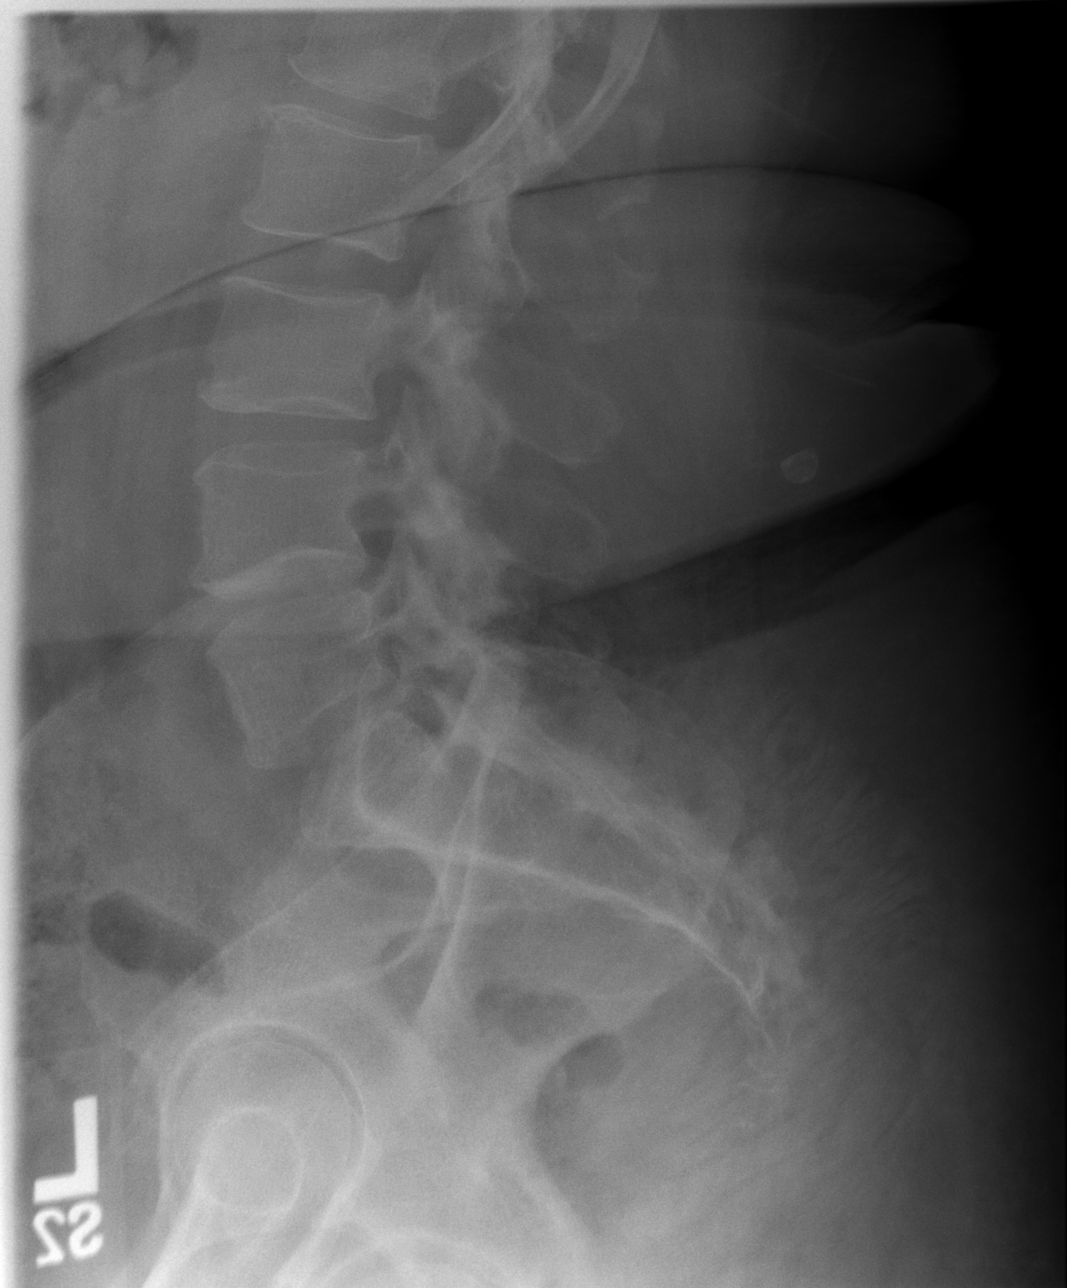

[5 of 5 positions shown; findings below may reference images not displayed]

FINDINGS: Five lumbar vertebrae.
Diffuse bony demineralization.
Vertebral body and disc space heights maintained.
No fracture, subluxation, or bone destruction.
Minimal facet degenerative changes lower lumbar spine.
No spondylolysis.
IMPRESSION: Bony demineralization.
No acute abnormalities.

## 2009-02-28 IMAGING — CT CT NECK W/ CM
1 series · 12 of 14 positions shown, 15 images · IV contrast (APPLIED)
Comparison: CT head 06/29/2005.

CLINICAL DATA: 57-year-old female with possible tracheal
compression.

CT NECK WITH CONTRAST
TECHNIQUE: Multidetector CT imaging of the neck was performed with
intravenous contrast.
Contrast: 100 ml Vmnipaque-CBB.

[Series 3: st neck 2.0 b31s · axial · 0.46mm/px · z∈[-299,-91]mm · 12 of 124 slices shown, 15 images]
[im 10/124  soft-tissue]
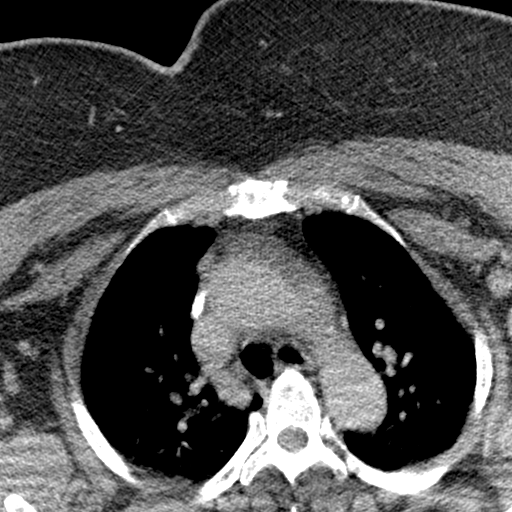
[im 10/124  bone]
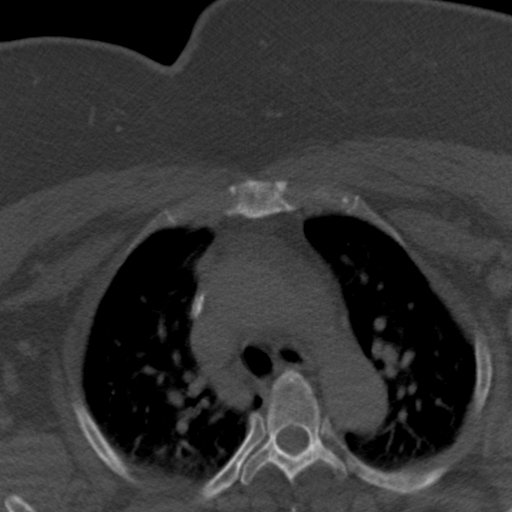
[im 19/124  bone]
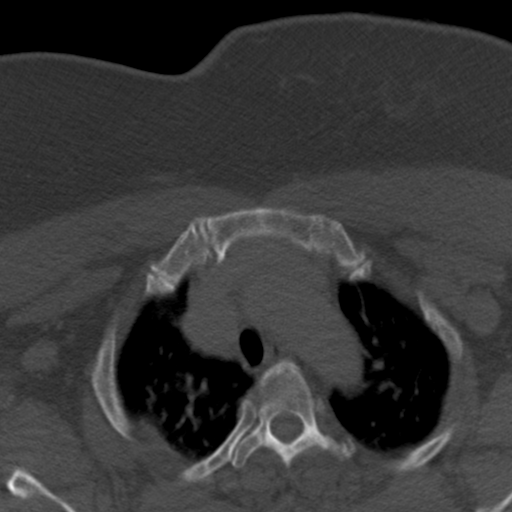
[im 29/124  bone]
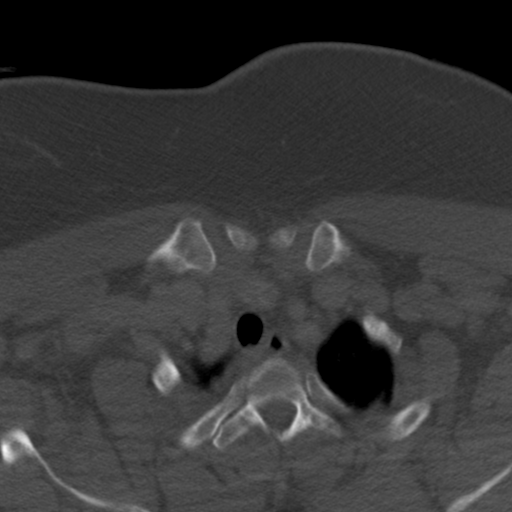
[im 38/124  bone]
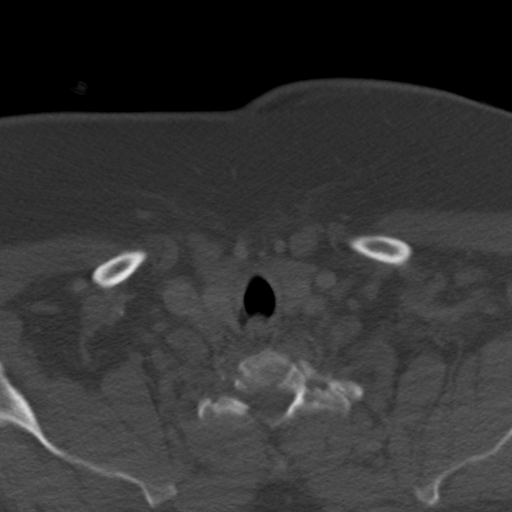
[im 48/124  soft-tissue]
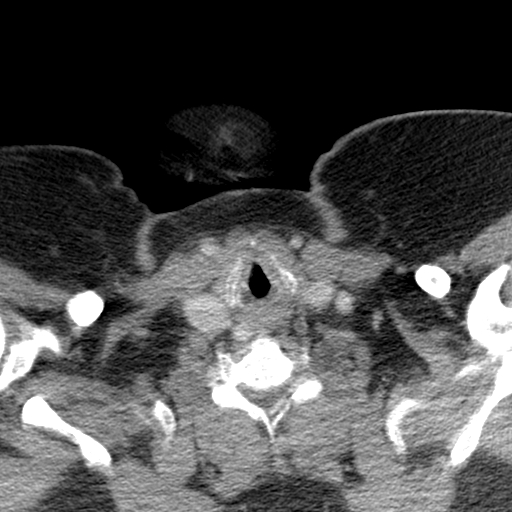
[im 48/124  bone]
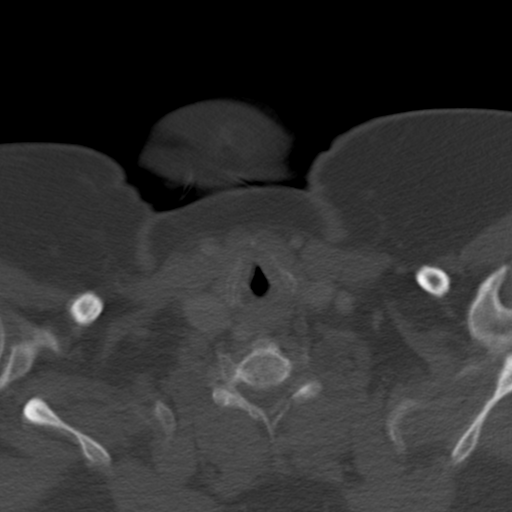
[im 57/124  bone]
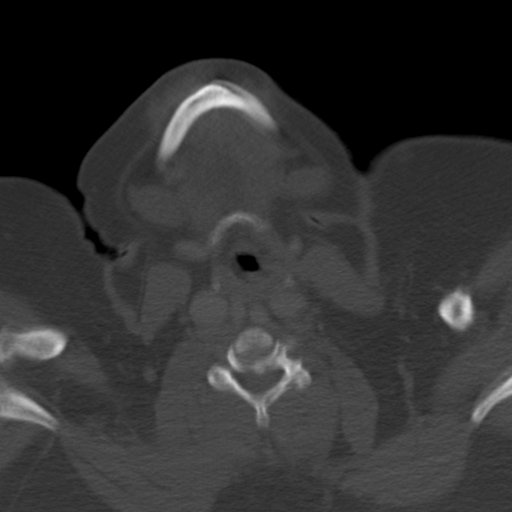
[im 67/124  bone]
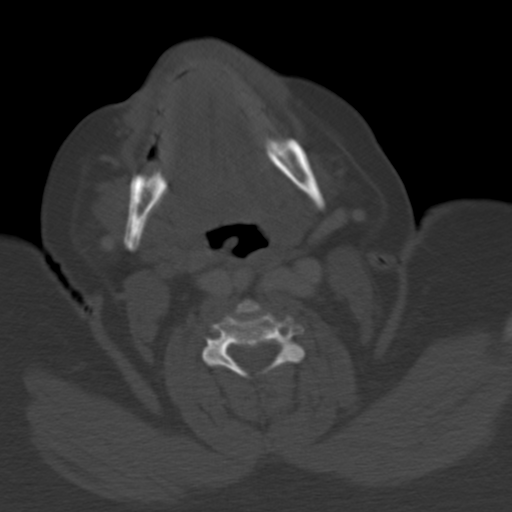
[im 76/124  bone]
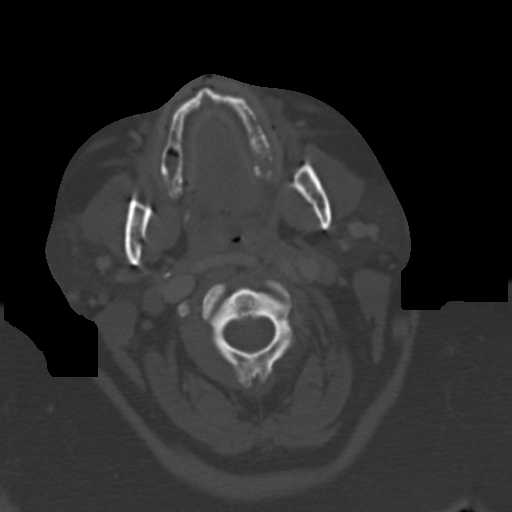
[im 86/124  soft-tissue]
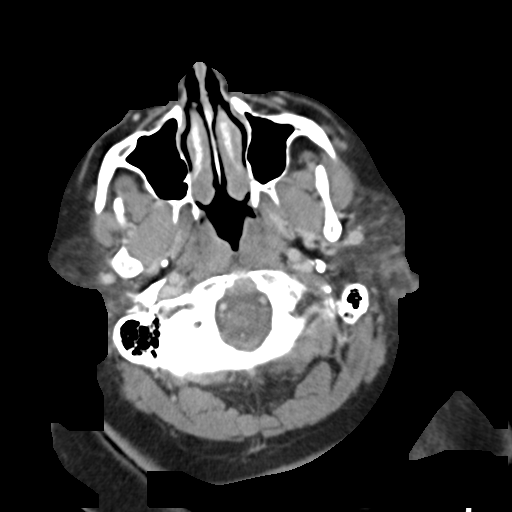
[im 86/124  bone]
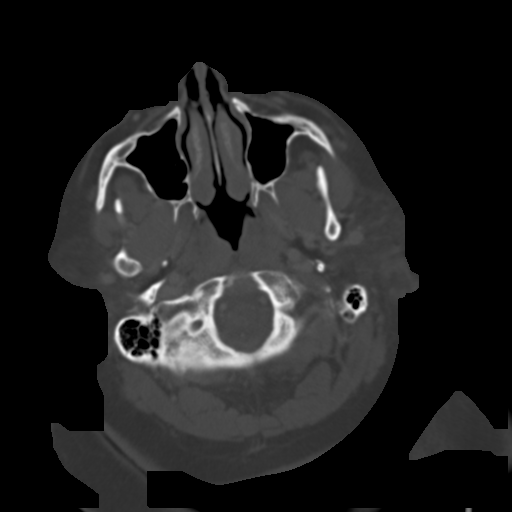
[im 95/124  bone]
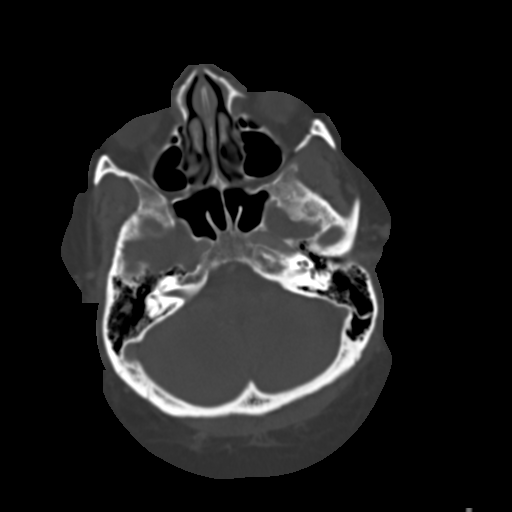
[im 105/124  bone]
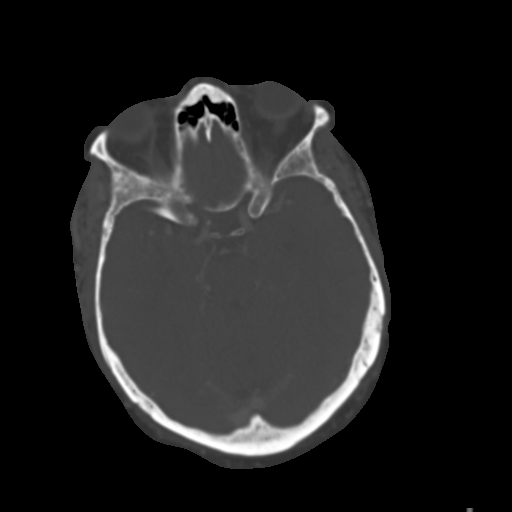
[im 114/124  bone]
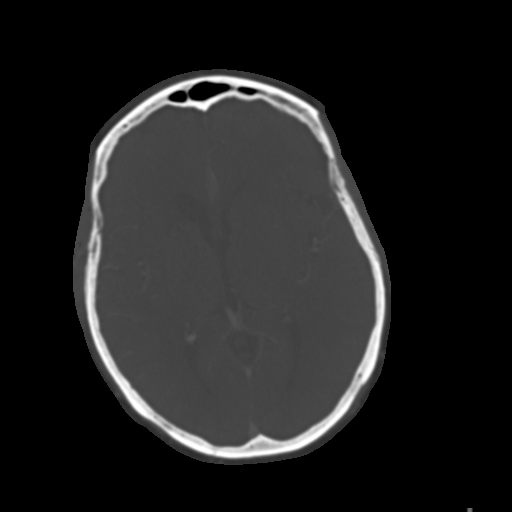

[12 of 14 positions shown; findings below may reference images not displayed]

FINDINGS: No sagittal or coronal reformatted images are available.

Very large body habitus.  There is respiratory motion artifact
noted in the visualized upper lungs.  There is no significant
compression of the trachea which is patent to the level of the
carina.  However, there is supraglottic airway stenosis at the
level of the oropharynx at the level of the uvula (series 3 and
five image 50).  This appears related to enlarged tonsillar pillars
and soft palate.  The hypopharynx and vallecula are within normal
limits.  The nasopharynx is within normal limits.

Stable visualized brain parenchyma.  Orbits, parapharyngeal and
sublingual spaces are within normal limits.  The parotid and
submandibular glands within normal limits.  The retropharyngeal
space is normal aside from of very medial, tortuous course of the
internal carotid arteries, right more so than left.  The common
carotid and bilateral carotid bifurcations occupies the
retropharynx.  In fact, both internal jugular veins also occupy the
retropharynx at the level of the hyoid.  Major vascular structures
are patent.

No cervical lymphadenopathy.  No acute osseous abnormality.
Visualized paranasal sinuses and mastoids are clear.  There is
atelectasis in the visualized lungs, but this appearance may be
exacerbated by respiratory motion artifact.
IMPRESSION: 1.  There is stenosis of the upper airway at the level of the
oropharynx as detailed above, no subglottic or tracheal stenosis is
identified.  Of note, both carotid arteries and internal jugular
veins are tortuous and have a retropharyngeal course.
2.  Otherwise no acute findings identified in the neck.

## 2009-03-19 ENCOUNTER — Telehealth (INDEPENDENT_AMBULATORY_CARE_PROVIDER_SITE_OTHER): Payer: Self-pay

## 2009-03-23 ENCOUNTER — Encounter: Payer: Self-pay | Admitting: Cardiovascular Disease

## 2009-03-23 ENCOUNTER — Ambulatory Visit: Payer: Self-pay

## 2009-03-25 ENCOUNTER — Ambulatory Visit: Payer: Self-pay

## 2009-05-14 ENCOUNTER — Ambulatory Visit (HOSPITAL_COMMUNITY): Admission: RE | Admit: 2009-05-14 | Discharge: 2009-05-14 | Payer: Self-pay | Admitting: General Surgery

## 2009-10-06 ENCOUNTER — Observation Stay (HOSPITAL_COMMUNITY): Admission: EM | Admit: 2009-10-06 | Discharge: 2009-10-07 | Payer: Self-pay | Admitting: Emergency Medicine

## 2009-11-21 IMAGING — CR DG CHEST 1V PORT
1 series · 1 of 1 positions shown · non-contrast
Comparison: 02/06/2008

CLINICAL DATA: Shortness of breath.

PORTABLE CHEST - 1 VIEW

[AP]
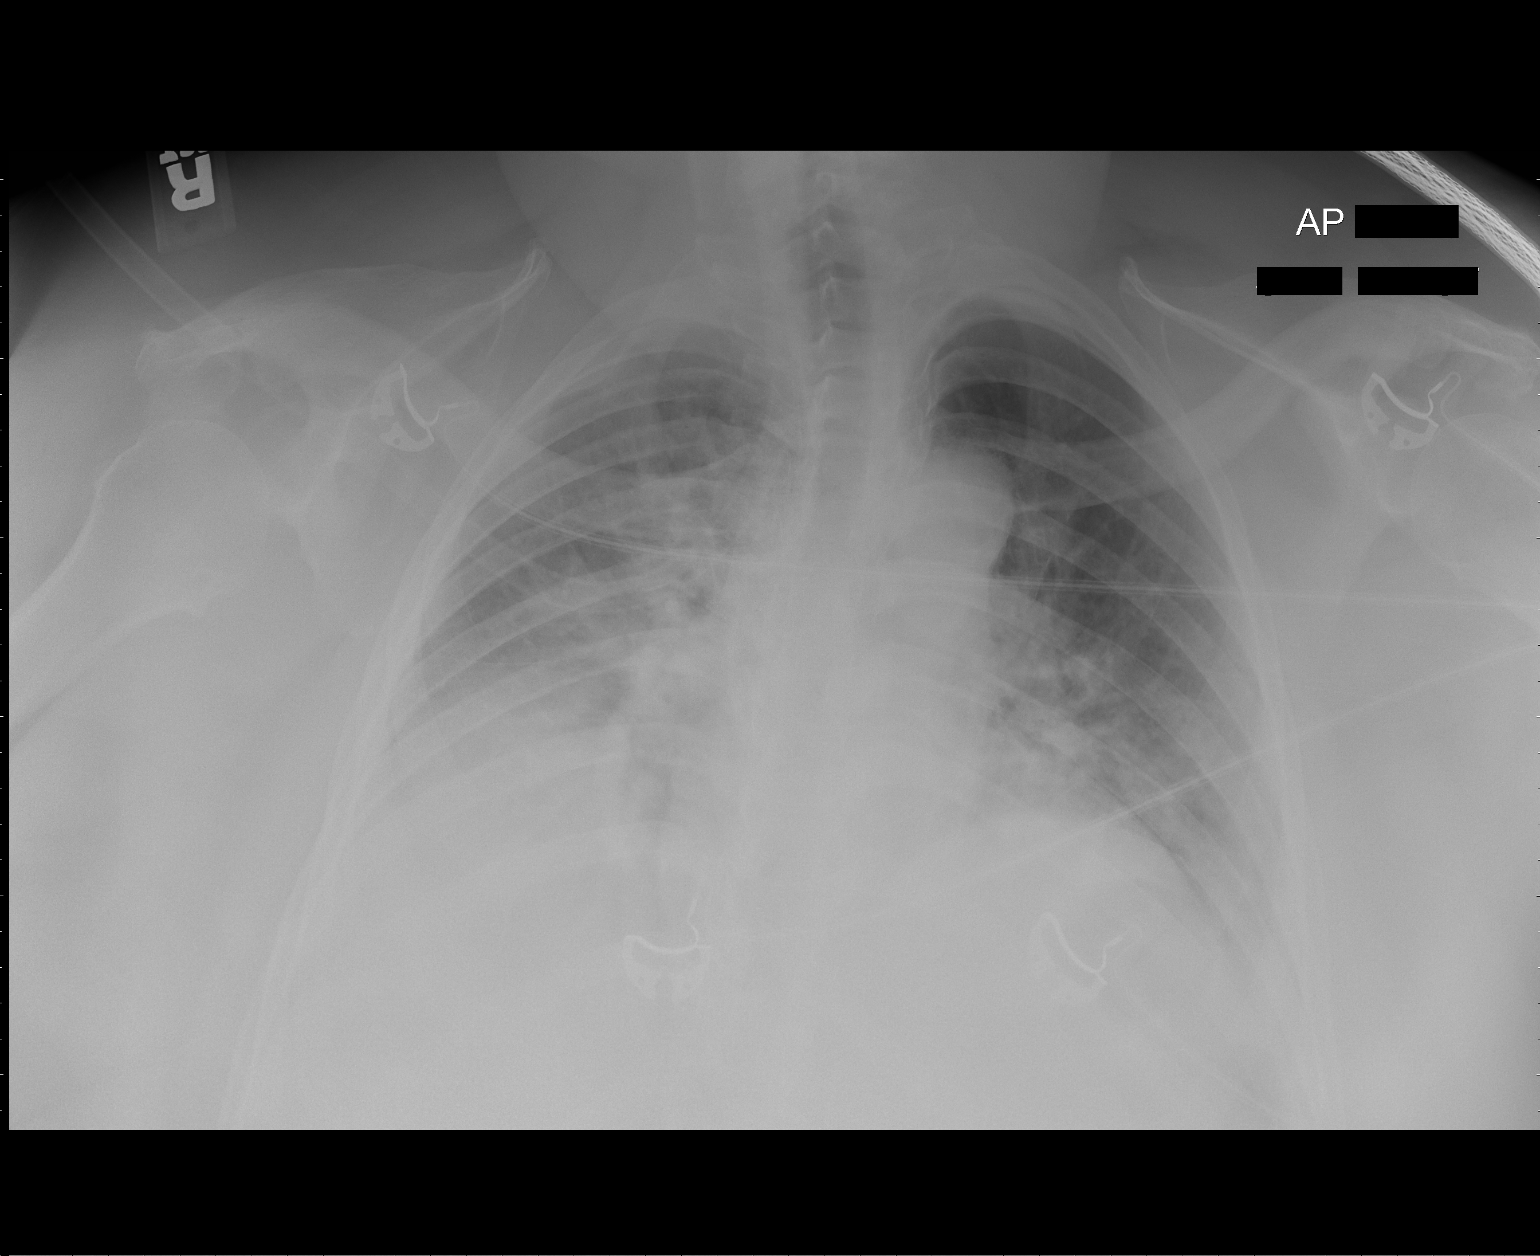

[1 of 1 positions shown; findings below may reference images not displayed]

FINDINGS: Bilateral airspace opacities are present obscuring the
hemidiaphragms.  Perihilar airspace opacities are noted.  The
appearance could represent bilateral pneumonia or acute pulmonary
edema.  Cardiac borders are obscured but there is suspicion for
mild cardiomegaly.  Low lung volumes are present.
IMPRESSION: 1.  Bilateral airspace opacities suspicious for bilateral pneumonia
or acute pulmonary edema.  There is likely mild cardiomegaly.
2.  Low lung volumes.

## 2009-11-22 IMAGING — CR DG CHEST 1V PORT
1 series · 1 of 1 positions shown · non-contrast
Comparison: Portable chest x-ray of 11/13/2008 and 02/06/2008

CLINICAL DATA: Severe pneumonia, follow-up

PORTABLE CHEST - 1 VIEW

[AP]
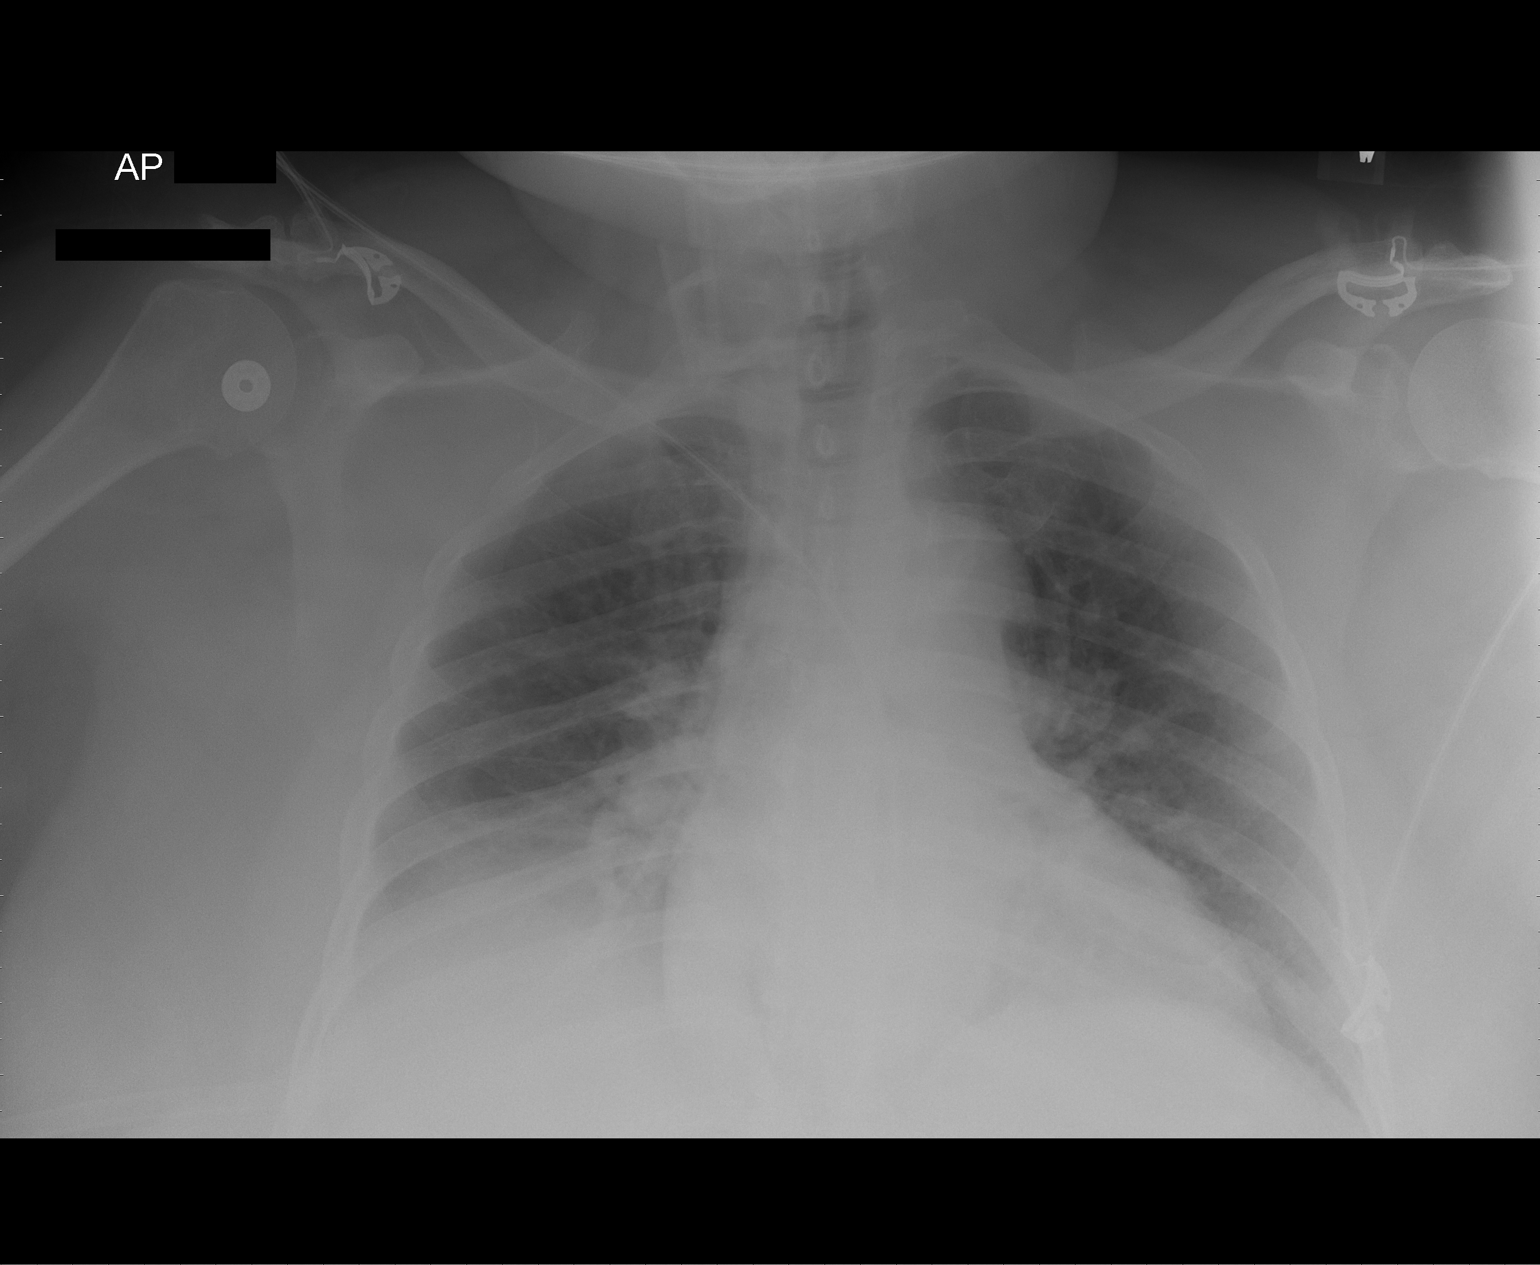

[1 of 1 positions shown; findings below may reference images not displayed]

FINDINGS: Aeration has improved somewhat.  Mild basilar haziness
right greater than left remains which may reflect residual
atelectasis or pneumonia.  Cardiomegaly is stable.  No bony
abnormality is seen.
IMPRESSION: Improved aeration.  Haziness at the bases right greater than left
may reflect residual atelectasis or pneumonia.

## 2009-11-23 IMAGING — CT CT CHEST W/O CM
2 of 3 series · 15 of 36 positions shown, 18 images · non-contrast
Comparison: None

CLINICAL DATA: Bilateral pneumonias

CT CHEST WITHOUT CONTRAST
TECHNIQUE: Multidetector CT imaging of the chest was performed
following the standard protocol without IV contrast.

[Series 2: routine chest 5.0 st · axial · 0.65mm/px · z∈[+1130,+1365]mm · 12 of 57 slices shown, 15 images]
[im 5/57  mediastinal]
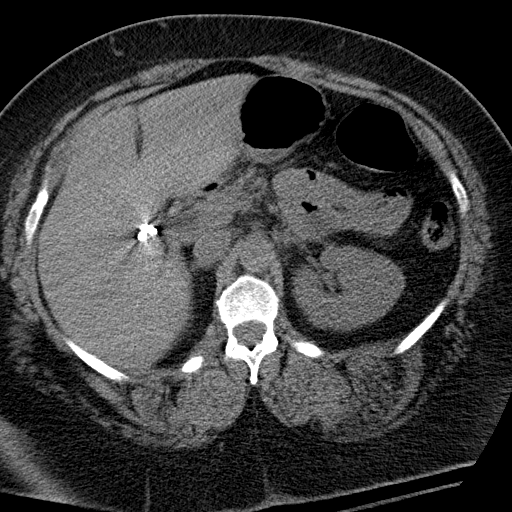
[im 5/57  lung]
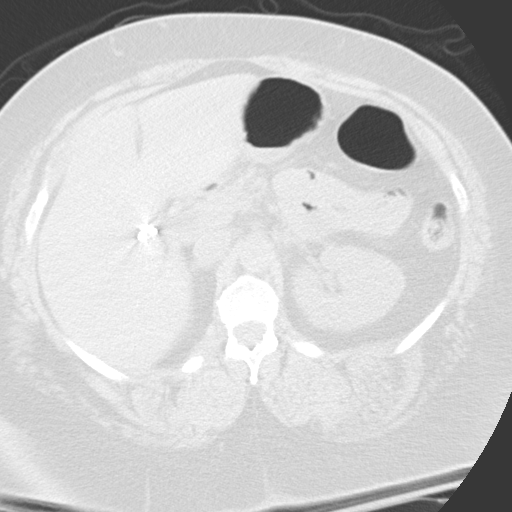
[im 9/57  lung]
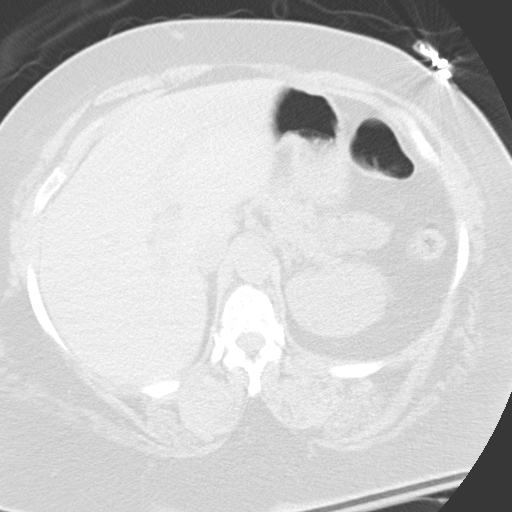
[im 13/57  lung]
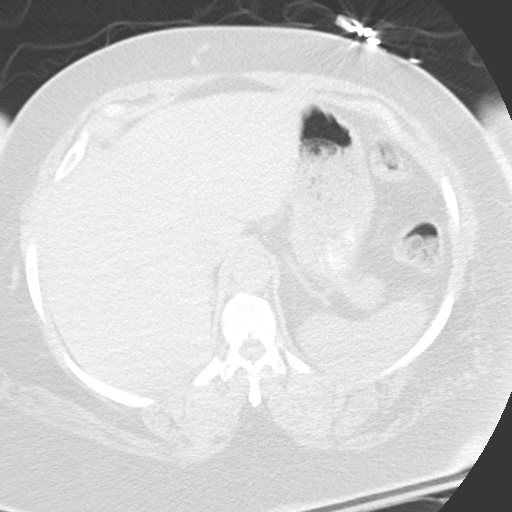
[im 17/57  lung]
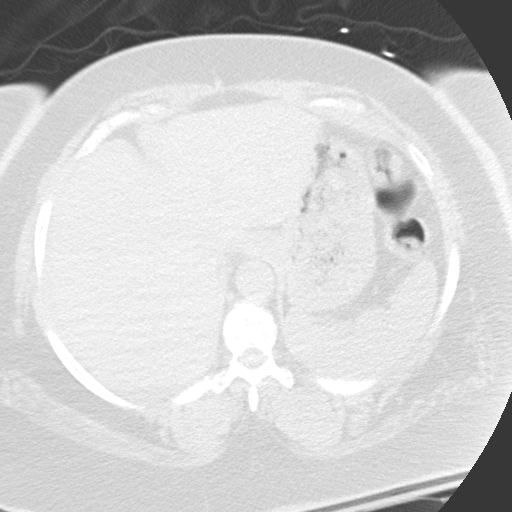
[im 21/57  mediastinal]
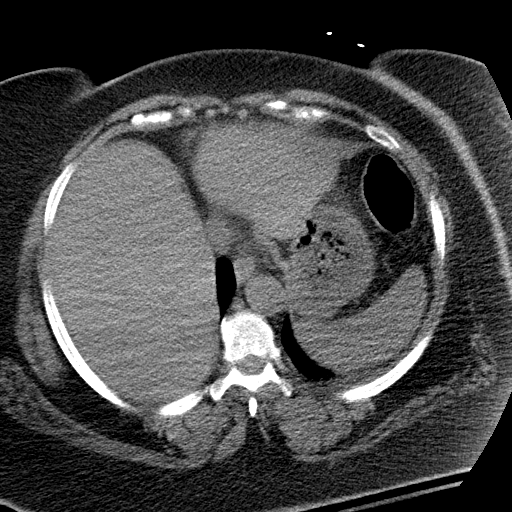
[im 21/57  lung]
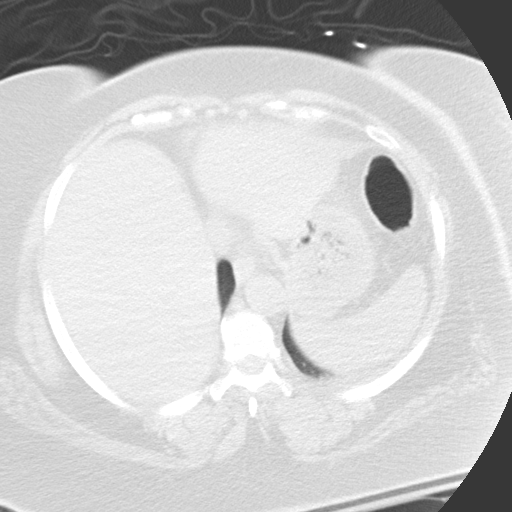
[im 25/57  lung]
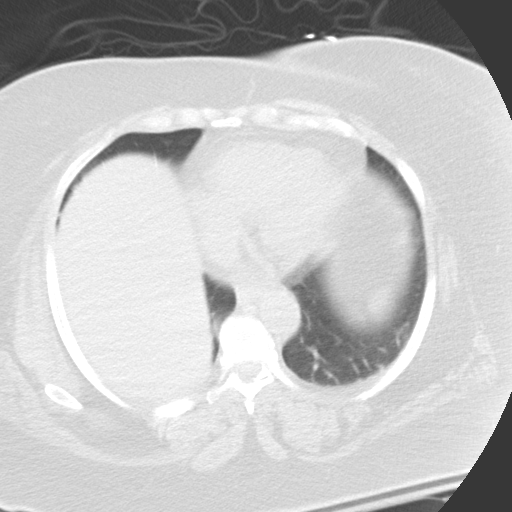
[im 32/57  lung]
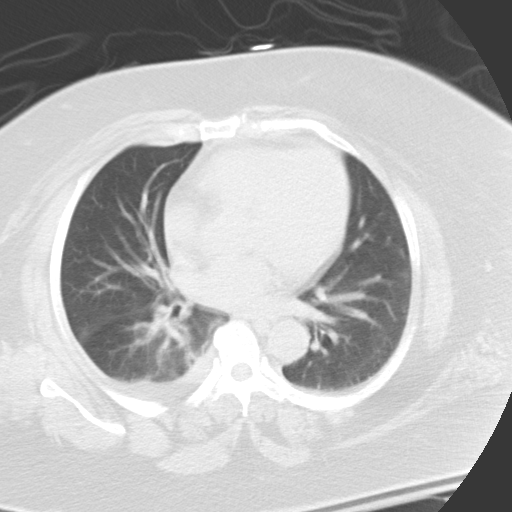
[im 36/57  lung]
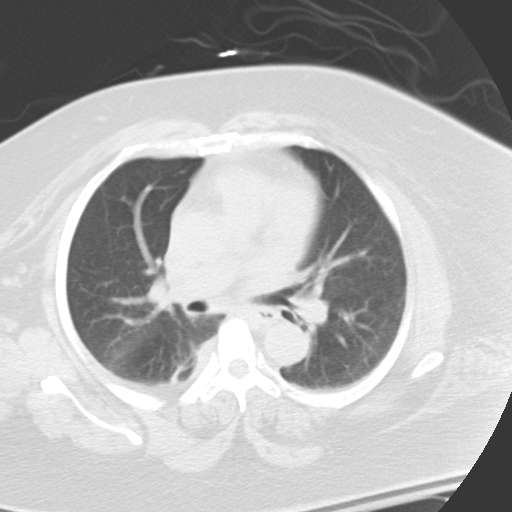
[im 40/57  mediastinal]
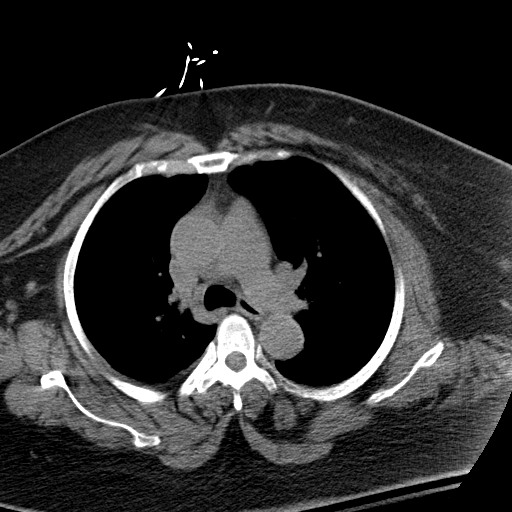
[im 40/57  lung]
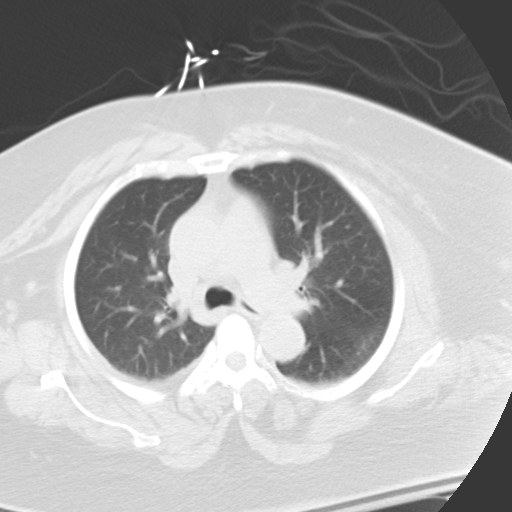
[im 44/57  lung]
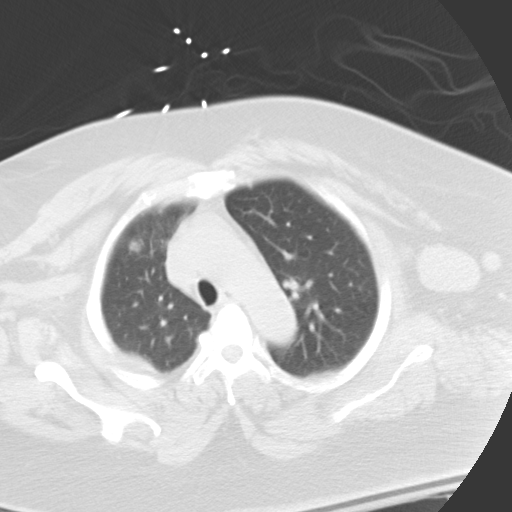
[im 48/57  lung]
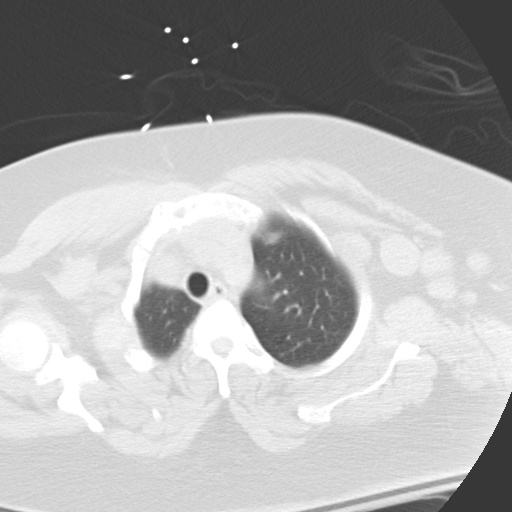
[im 52/57  lung]
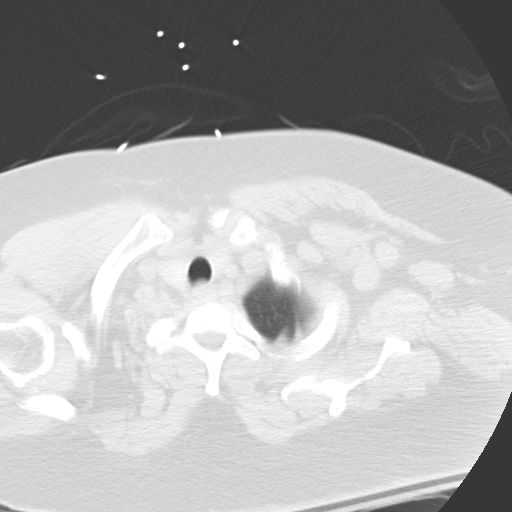

[Series 602: coronal · coronal · 0.65mm/px · 3 of 85 slices shown]
[im 17/85  lung]
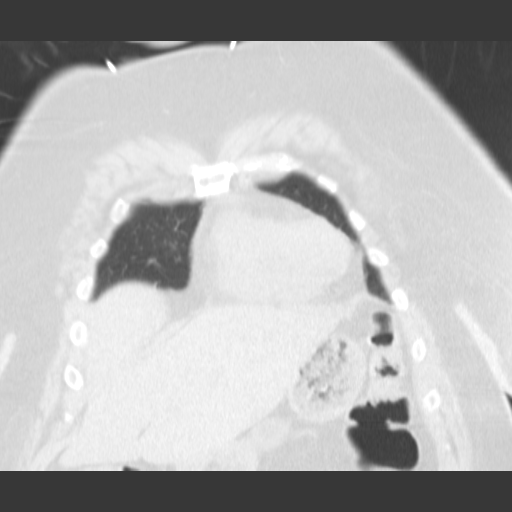
[im 34/85  lung]
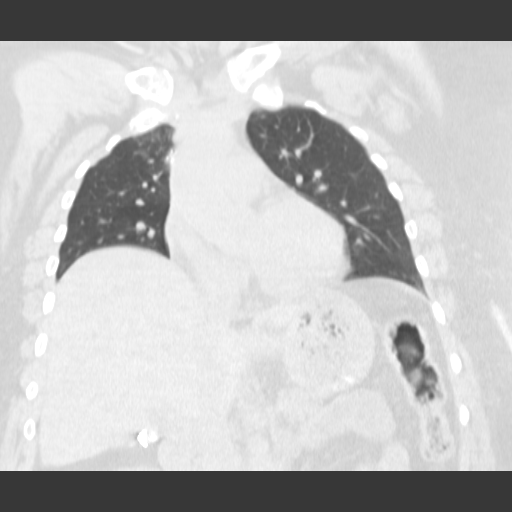
[im 51/85  lung]
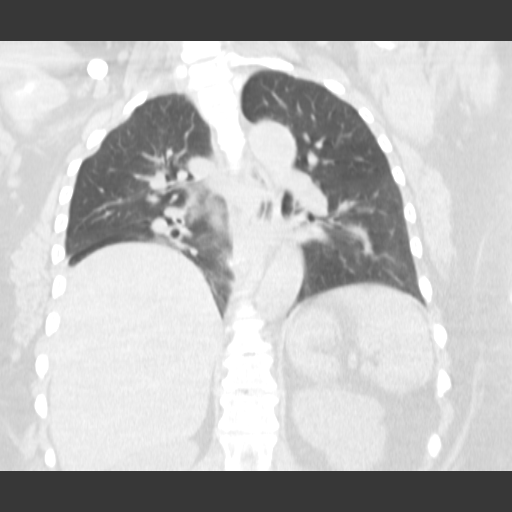

[15 of 36 positions shown; findings below may reference images not displayed]

FINDINGS: Multiple enlarged left axillary lymph nodes are noted.
For example, left axillary lymph node measures 4 x 2.9 cm, image
13.  This is compared with the same previously.  Enlarged
retropectoral lymph node measures 2 cm, image #8.  This is compared
with 1.6 cm previously.

No enlarged right-sided axillary lymph nodes.

There is no enlarged supraclavicular, mediastinal or hilar lymph
nodes.

Heart size is normal.

There is no pericardial effusion.

There is a small right pleural effusion.

There is asymmetric elevation of the right hemidiaphragm with
overlying atelectasis and mild consolidation noted.

Limited imaging through the upper abdomen shows cholecystectomy
clips.

No acute findings noted.
IMPRESSION: 1.  Small right pleural effusion.
2.  Right lower lobe atelectasis and consolidation.
3.  Pathologically enlarged left axillary lymph nodes.  These were
present on the exam from 08/11/2007 and remain indeterminate.  If
these have not been assessed previously, I would advise
percutaneous biopsy to evaluate for either breast cancer metastasis
or lymphoma.

## 2009-11-23 IMAGING — CR DG CHEST 1V PORT
1 series · 1 of 1 positions shown · non-contrast
Comparison: 11/14/2008

CLINICAL DATA: Pneumonia

PORTABLE CHEST - 1 VIEW

[AP]
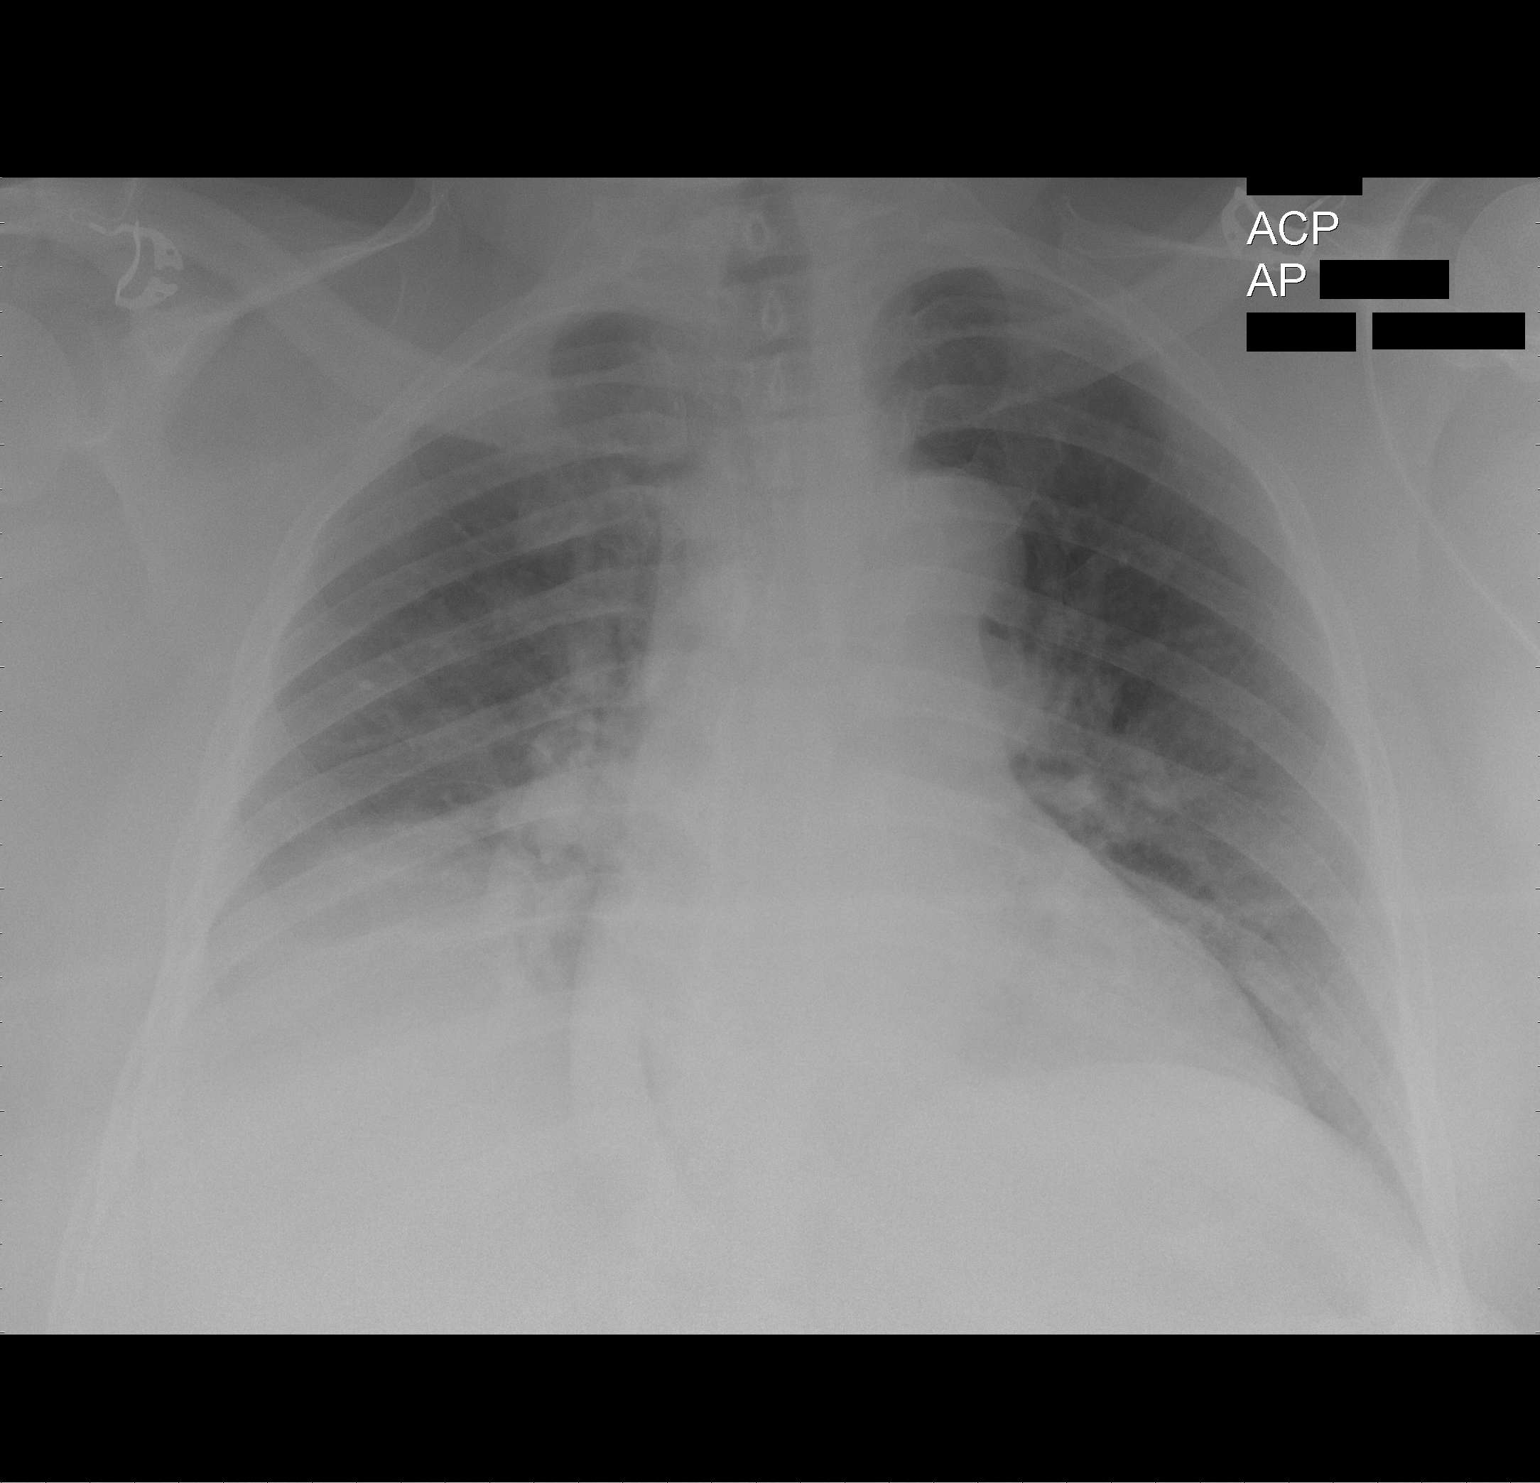

[1 of 1 positions shown; findings below may reference images not displayed]

FINDINGS: Right lower lobe opacity is unchanged.  Left lung remains
clear.  No pneumothorax.  Mild cardiomegaly.
IMPRESSION: Stable.

## 2009-11-26 IMAGING — US US BIOPSY
1 series · 14 of 19 positions shown · non-contrast
Comparison: none

Clinical Data/Indication: Left axillary lymph nodes

ULTRASOUND-GUIDED BIOPSYOF LEFT AXILLARY LYMPH NODES.  CORE BIOPSY.
Procedure: The procedure, risks, benefits, and alternatives were
explained to the patient. Questions regarding the procedure were
encouraged and answered. The patient understands and consents to
the procedure.
The left axilla was prepped with betadine in a sterile fashion, and
a sterile drape was applied covering the operative field. A sterile
gown and sterile gloves were used for the procedure.
Under sonographic guidance, an 30gauge guide needle was advanced
into the enlarged left axillary lymph node.  Eight  18 gauge core
biopsies were obtained. The guide needle was removed. Final imaging
was performed.
Patient tolerated the procedure well without complication.  Vital
sign monitoring by nursing staff during the procedure will continue
as patient is in the special procedures unit for post procedure
observation.

[Series 1: us biopsy · 0.10mm/px · 14 of 19 slices shown]
[im 1/19]
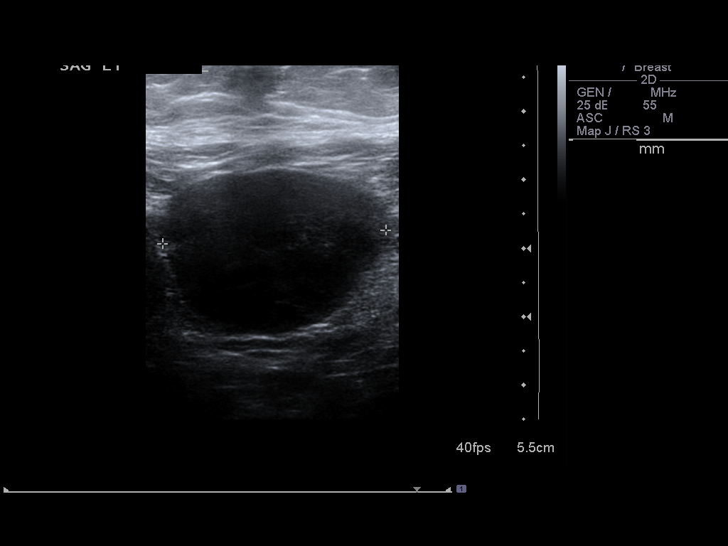
[im 3/19]
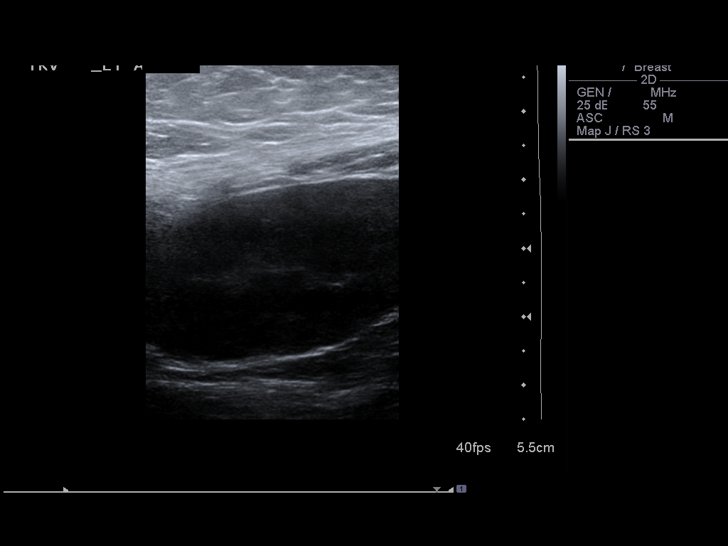
[im 4/19]
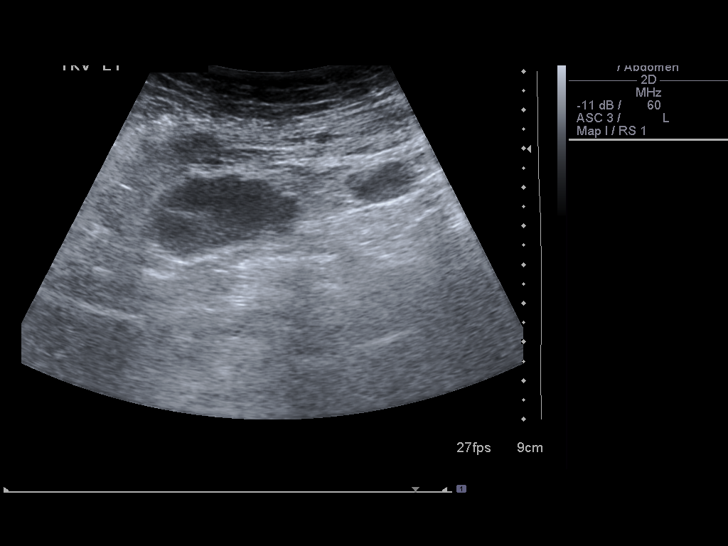
[im 5/19]
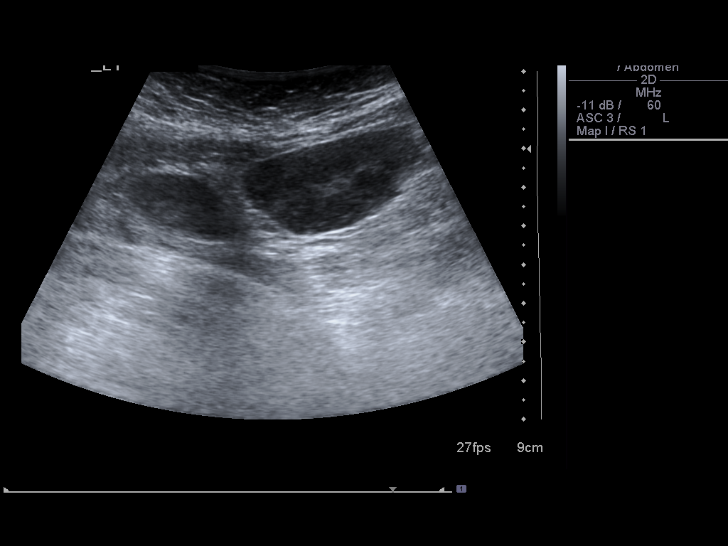
[im 7/19]
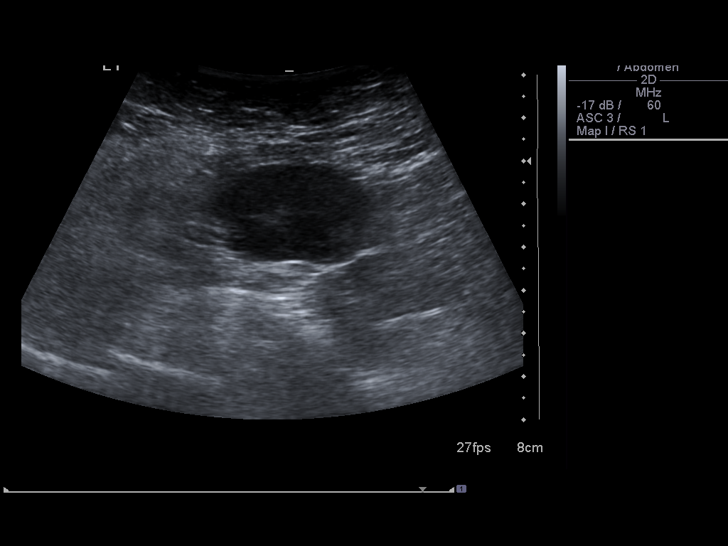
[im 8/19]
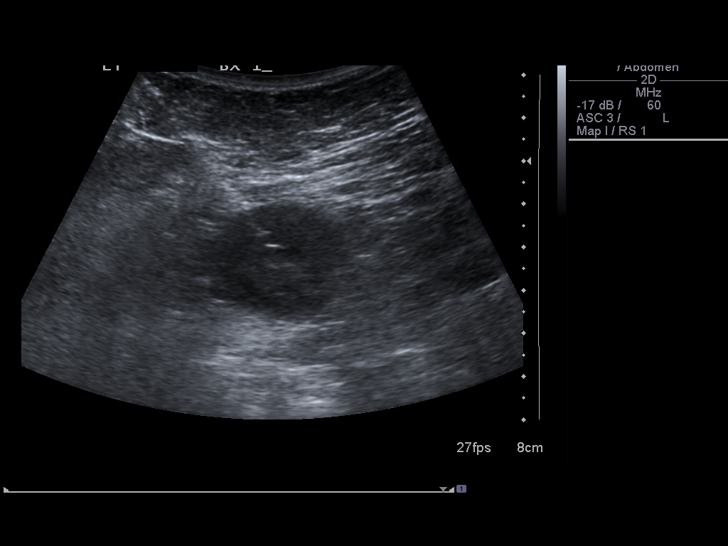
[im 9/19]
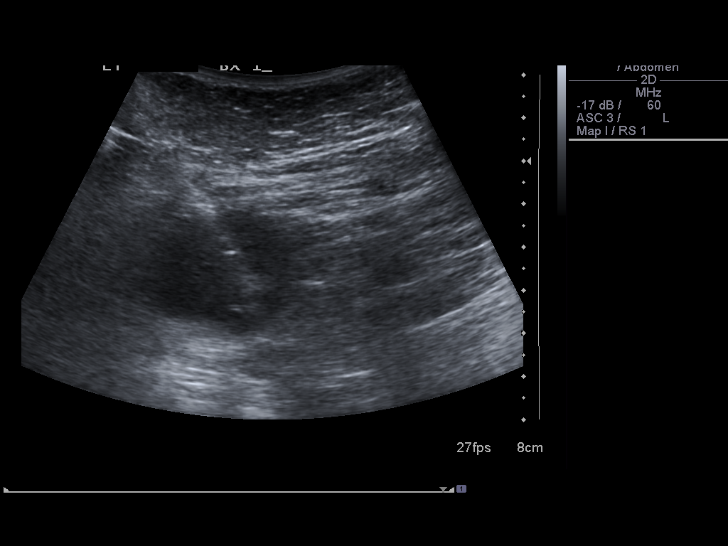
[im 11/19]
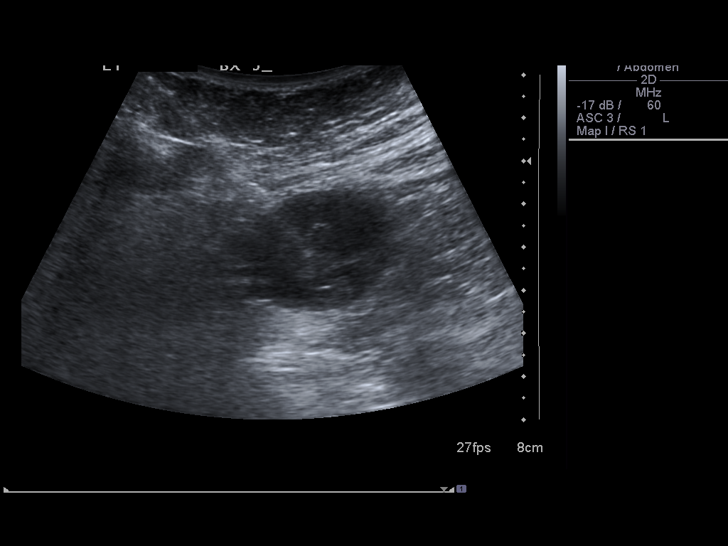
[im 12/19]
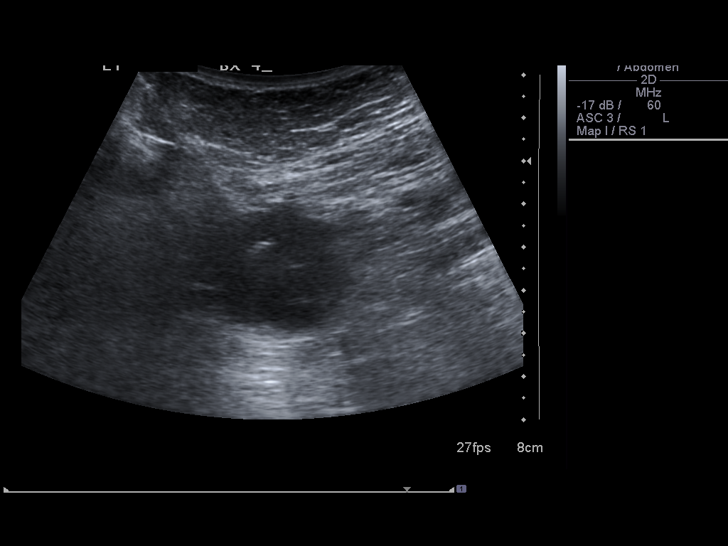
[im 13/19]
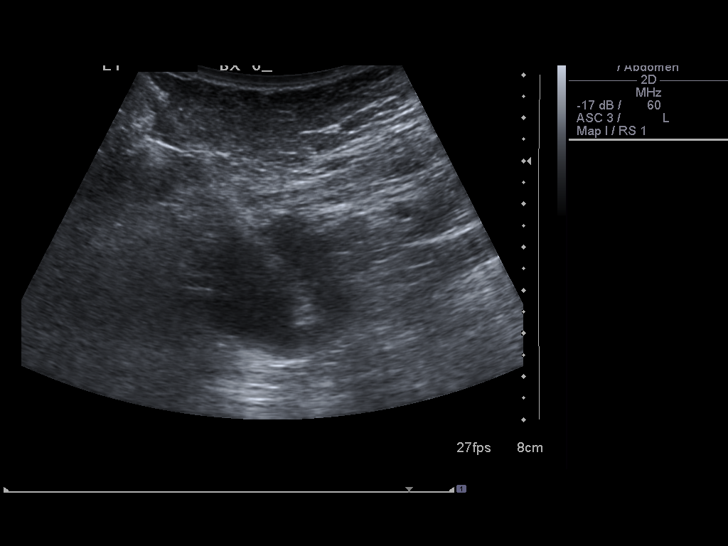
[im 15/19]
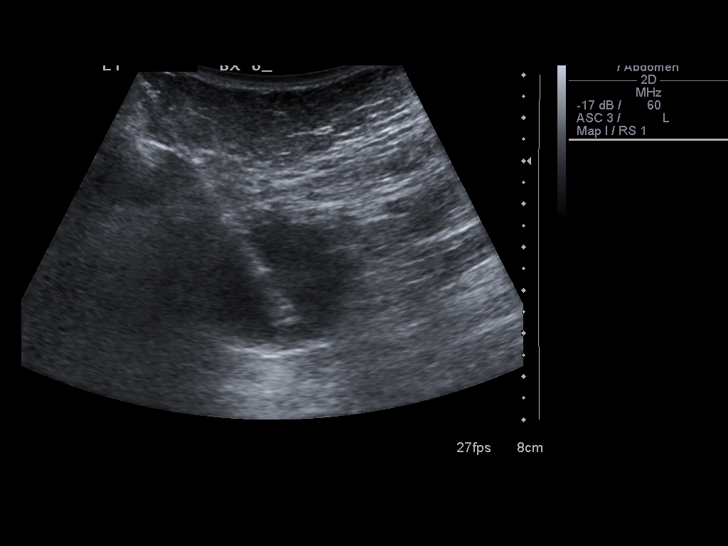
[im 16/19]
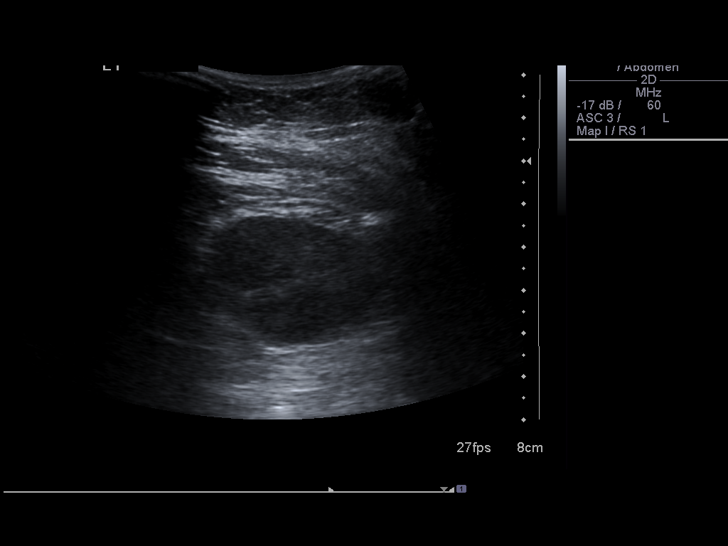
[im 17/19]
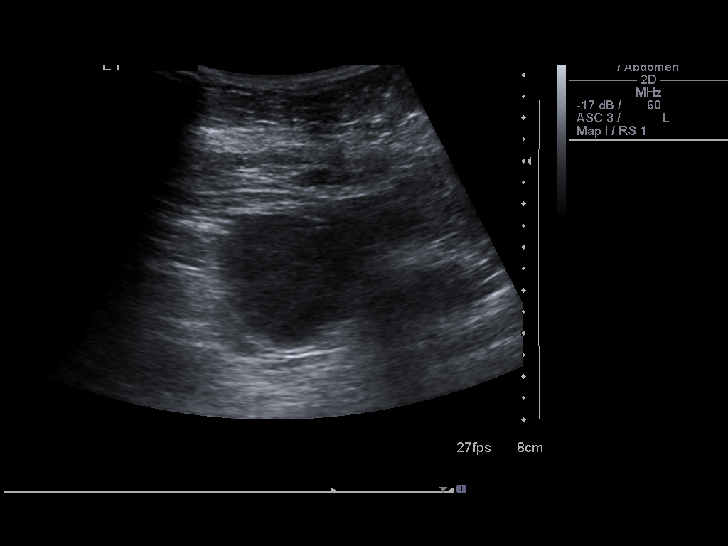
[im 19/19]
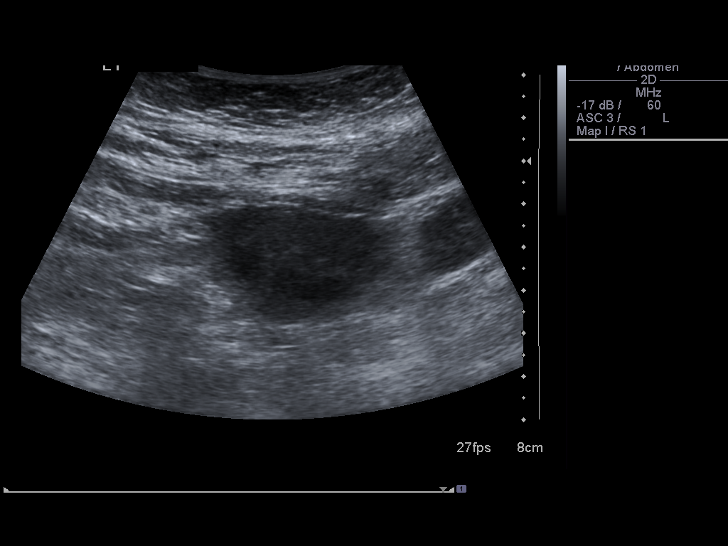

[14 of 19 positions shown; findings below may reference images not displayed]

FINDINGS: The images document guide needle placement within the
left axillary lymph node. Post biopsy images demonstrate no
hemorrhage.
IMPRESSION: Successful ultrasound-guided core  biopsy of a left axillary lymph
node.

## 2010-01-14 IMAGING — CT CT L SPINE W/O CM
2 of 4 series · 7 of 14 positions shown, 8 images · non-contrast
Comparison: CT chest 11/15/2008

CT THORACIC SPINE

CLINICAL DATA: Chronic back pain.  Right hip pain.

CT THORACIC AND LUMBAR SPINE WITHOUT CONTRAST
TECHNIQUE: Multidetector CT imaging of the thoracic and lumbar
spine was performed without contrast. Multiplanar CT image
reconstructions were also generated.

[Series 2: t spine · axial · 0.42mm/px · z∈[-399,-107]mm · 4 of 195 slices shown, 5 images]
[im 39/195  soft-tissue]
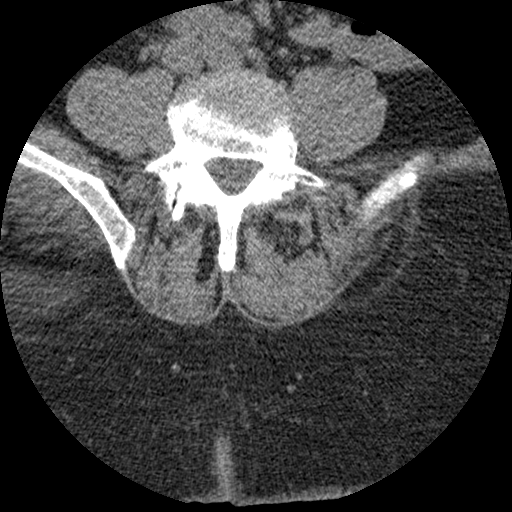
[im 39/195  bone]
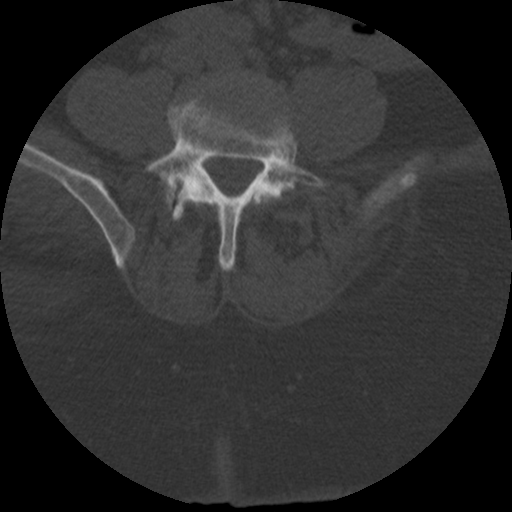
[im 78/195  bone]
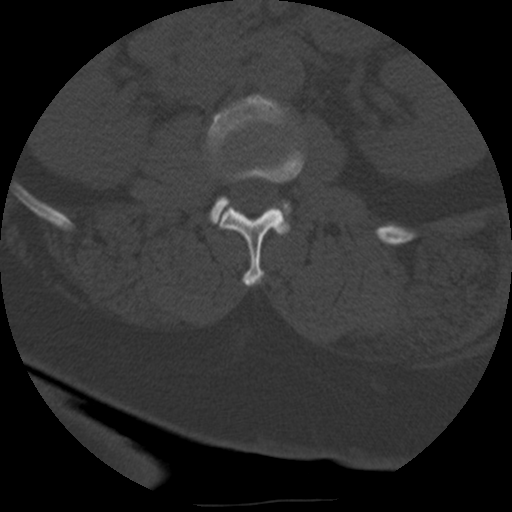
[im 117/195  bone]
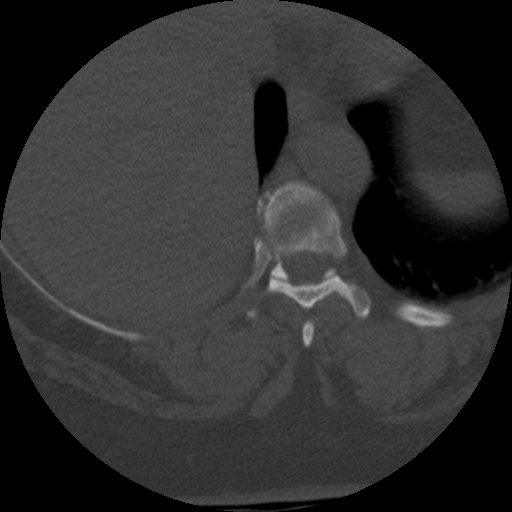
[im 156/195  bone]
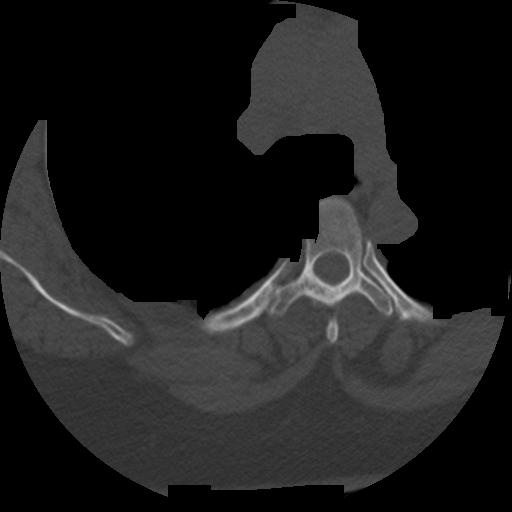

[Series 3: recon 2: t spine · axial · 0.42mm/px · z∈[-374,-132]mm · 3 of 195 slices shown]
[im 49/195  bone]
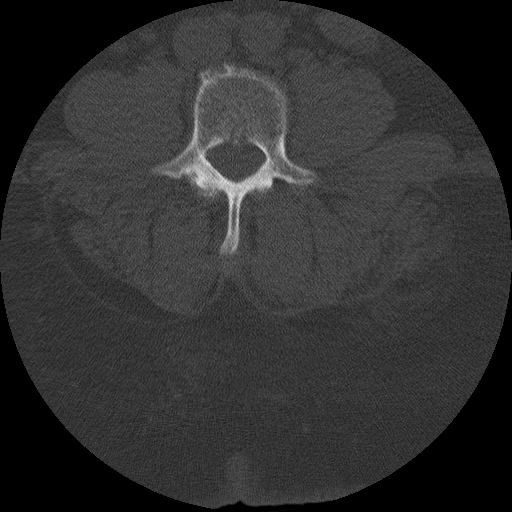
[im 98/195  bone]
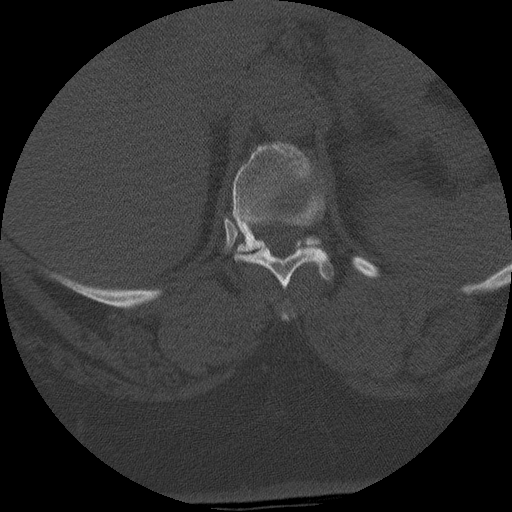
[im 146/195  bone]
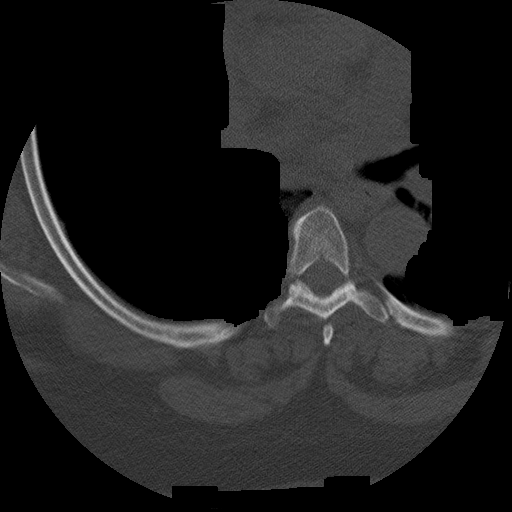

[7 of 14 positions shown; findings below may reference images not displayed]

FINDINGS: Vertebral body height and alignment are maintained.
There is some convex left scoliosis of the upper thoracic spine
with the apex at approximately T3-4.  The patient has scattered
facet degenerative change appearing worst on the right at T4-5 and
T5-6.  Anterior endplate spurring is seen in the mid and lower
thoracic spine appearing most notable T6-7, T7-8 and T10-11.  The
central canal appears patent by CT and neural foramina also appear
patent.  Imaged paraspinous soft tissues demonstrate improved
airspace disease in the right lower lobe with some mild atelectasis
persisting.  No pleural effusion.
IMPRESSION: 1.  Negative for fracture.
2.  Spondylosis as detailed above.  Degenerative change involves
the facets on the right at T4-5 and T5-6.
3.  Improved right lower lobe airspace disease.

CT LUMBAR SPINE
FINDINGS: Vertebral body height and alignment are maintained in the
lumbar spine.  There is some exaggeration of the normal lumbar
lordosis.  The patient has scattered facet degenerative disease
appearing worst on the right at L4-5 and L5-S1.

L1-2, L2-3:  Negative.

L3-4:  Mild disc bulging and ligamentum flavum thickening cause
mild central canal and left foraminal narrowing.  Right foramen
appears open.

L4-5:  There is ligamentum flavum thickening and a broad-based disc
bulge which appear to cause moderate central canal stenosis.  There
is also mild to moderate left foraminal narrowing.  Right foramen
is open.

L5-S1:  Mild broad-based disc bulge without central canal
narrowing.  Moderate bilateral foraminal narrowing appears worse on
the left.
IMPRESSION: Spondylosis at L4-5 and L5-S1 but the L4-5 level appears more
markedly affected where there is moderate central canal narrowing.
Right worse than left facet degenerative change is present at both
levels.

## 2010-02-21 ENCOUNTER — Emergency Department (HOSPITAL_COMMUNITY): Admission: EM | Admit: 2010-02-21 | Discharge: 2010-02-21 | Payer: Self-pay | Admitting: Emergency Medicine

## 2010-04-02 ENCOUNTER — Inpatient Hospital Stay (HOSPITAL_COMMUNITY): Admission: EM | Admit: 2010-04-02 | Discharge: 2010-04-05 | Payer: Self-pay | Admitting: Emergency Medicine

## 2010-05-22 IMAGING — CR DG CHEST 2V
2 series · 2 of 2 positions shown · non-contrast
Comparison: 11/15/2008 study.

CLINICAL DATA: History of asthma, hypertension, COPD, shortness of
breath, coughing, and pain.  Preoperative cardiopulmonary
evaluation.

CHEST - 2 VIEW

[view not recorded (1 of 2)]
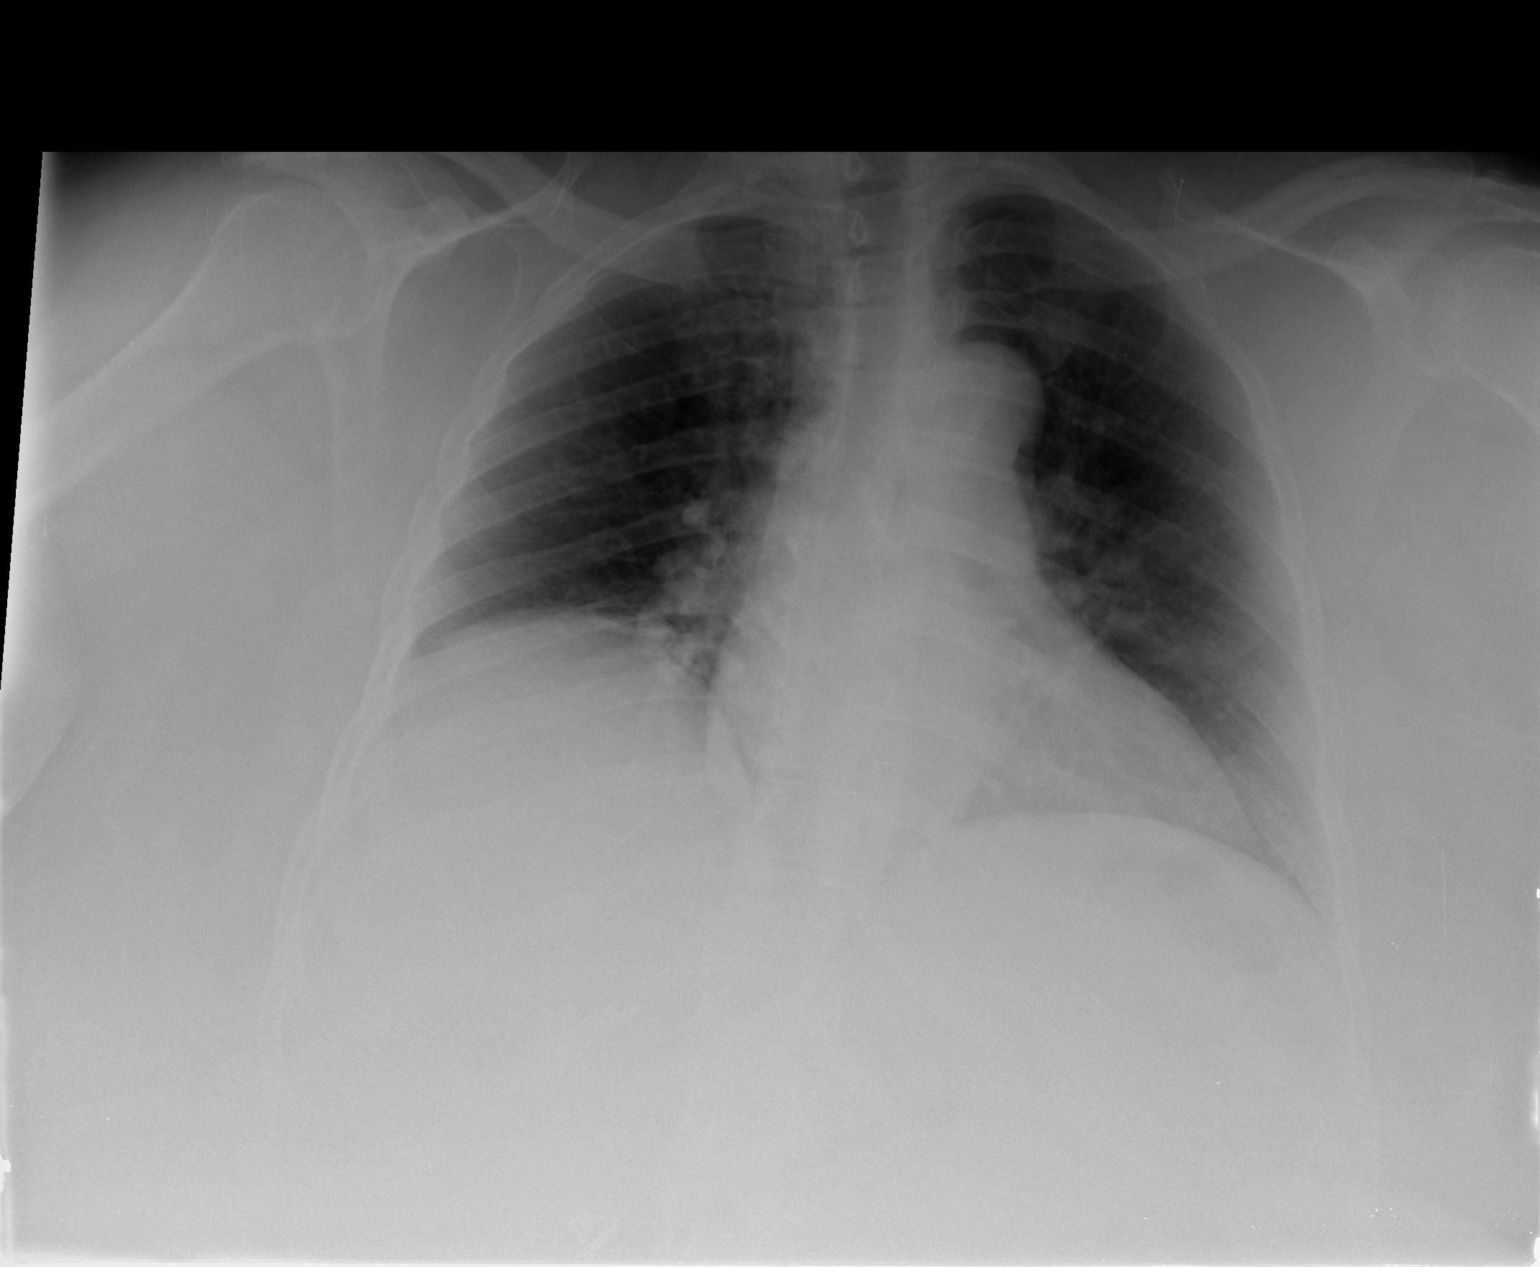

[view not recorded (2 of 2)]
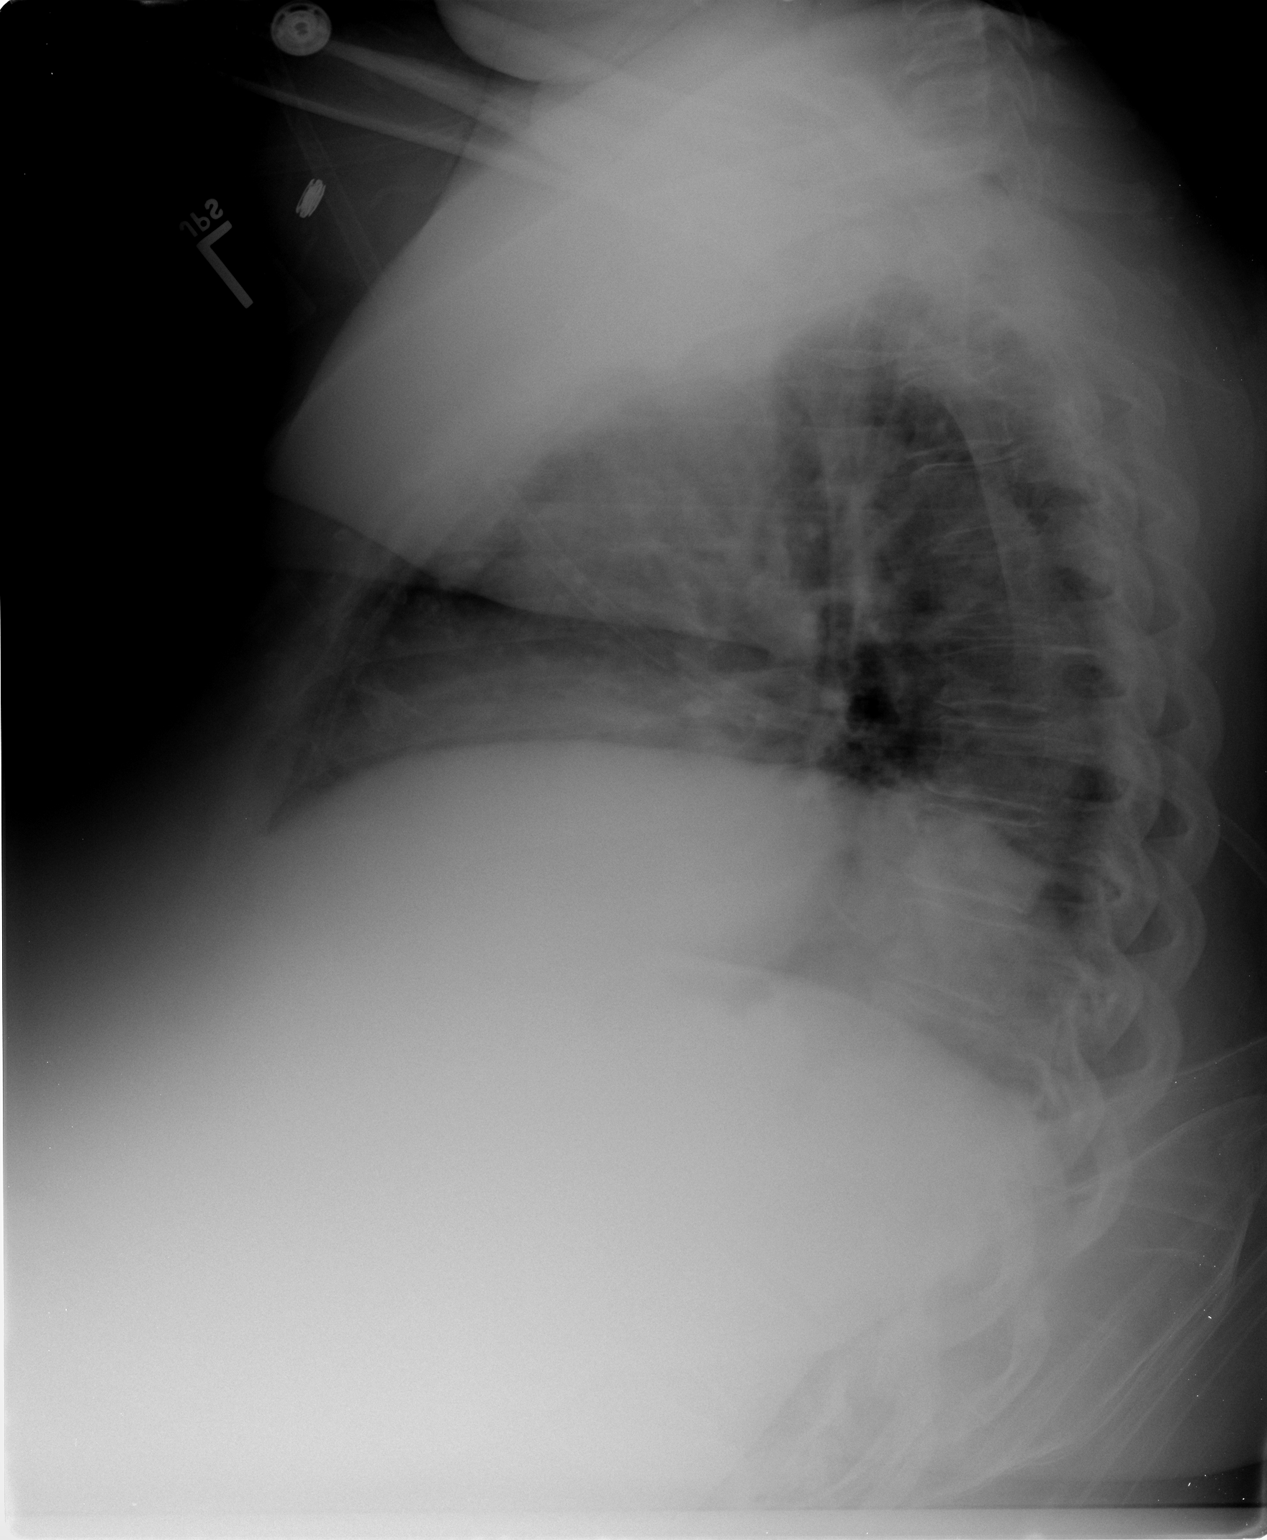

[2 of 2 positions shown; findings below may reference images not displayed]

FINDINGS: There is chronic elevation of the right hemidiaphragm
with minimal right basilar atelectasis.  No pulmonary edema,
pneumonia, pleural effusion is seen.  There is stable minimal
enlargement cardiac silhouette. Bones appear average for age.
IMPRESSION: There is stable minimal enlargement of the cardiac silhouette.
There is chronic elevation of the right hemidiaphragm with minimal
right basilar atelectasis.  No acute superimposed process is
identified.

## 2010-07-25 ENCOUNTER — Encounter: Payer: Self-pay | Admitting: Obstetrics and Gynecology

## 2010-08-08 ENCOUNTER — Inpatient Hospital Stay (HOSPITAL_COMMUNITY)
Admission: EM | Admit: 2010-08-08 | Discharge: 2010-08-12 | DRG: 603 | Disposition: A | Discharge status: Skilled Nursing Facility | Payer: Medicaid Other | Attending: Internal Medicine | Admitting: Internal Medicine

## 2010-08-08 ENCOUNTER — Emergency Department (HOSPITAL_COMMUNITY): Payer: Medicaid Other

## 2010-08-08 DIAGNOSIS — N179 Acute kidney failure, unspecified: Secondary | ICD-10-CM | POA: Diagnosis present

## 2010-08-08 DIAGNOSIS — L03119 Cellulitis of unspecified part of limb: Principal | ICD-10-CM | POA: Diagnosis present

## 2010-08-08 DIAGNOSIS — J4489 Other specified chronic obstructive pulmonary disease: Secondary | ICD-10-CM | POA: Diagnosis present

## 2010-08-08 DIAGNOSIS — D509 Iron deficiency anemia, unspecified: Secondary | ICD-10-CM | POA: Diagnosis present

## 2010-08-08 DIAGNOSIS — G4733 Obstructive sleep apnea (adult) (pediatric): Secondary | ICD-10-CM | POA: Diagnosis present

## 2010-08-08 DIAGNOSIS — I10 Essential (primary) hypertension: Secondary | ICD-10-CM | POA: Diagnosis present

## 2010-08-08 DIAGNOSIS — L03019 Cellulitis of unspecified finger: Secondary | ICD-10-CM | POA: Diagnosis present

## 2010-08-08 DIAGNOSIS — E662 Morbid (severe) obesity with alveolar hypoventilation: Secondary | ICD-10-CM | POA: Diagnosis present

## 2010-08-08 DIAGNOSIS — J449 Chronic obstructive pulmonary disease, unspecified: Secondary | ICD-10-CM | POA: Diagnosis present

## 2010-08-08 DIAGNOSIS — L02519 Cutaneous abscess of unspecified hand: Secondary | ICD-10-CM | POA: Diagnosis present

## 2010-08-08 DIAGNOSIS — E119 Type 2 diabetes mellitus without complications: Secondary | ICD-10-CM | POA: Diagnosis present

## 2010-08-08 DIAGNOSIS — Z6841 Body Mass Index (BMI) 40.0 and over, adult: Secondary | ICD-10-CM

## 2010-08-08 DIAGNOSIS — F319 Bipolar disorder, unspecified: Secondary | ICD-10-CM | POA: Diagnosis present

## 2010-08-08 DIAGNOSIS — I739 Peripheral vascular disease, unspecified: Secondary | ICD-10-CM | POA: Diagnosis present

## 2010-08-08 DIAGNOSIS — H409 Unspecified glaucoma: Secondary | ICD-10-CM | POA: Diagnosis present

## 2010-08-08 DIAGNOSIS — Z9981 Dependence on supplemental oxygen: Secondary | ICD-10-CM

## 2010-08-08 DIAGNOSIS — L02419 Cutaneous abscess of limb, unspecified: Principal | ICD-10-CM | POA: Diagnosis present

## 2010-08-08 HISTORY — DX: Shortness of breath: R06.02

## 2010-08-08 LAB — POCT CARDIAC MARKERS
CKMB, poc: 6.5 ng/mL (ref 1.0–8.0)
Myoglobin, poc: 488 ng/mL (ref 12–200)
Myoglobin, poc: 293 ng/mL (ref 12–200)
Troponin i, poc: <0.05 ng/mL (ref 0.00–0.09)

## 2010-08-08 LAB — DIFFERENTIAL
Basophils Relative: 0 % (ref 0–1)
Eosinophils Absolute: 0.1 10*3/uL (ref 0.0–0.7)
Eosinophils Relative: 2 % (ref 0–5)
Lymphs Abs: 1.9 10*3/uL (ref 0.7–4.0)
Monocytes Relative: 6 % (ref 3–12)

## 2010-08-08 LAB — PROCALCITONIN: Procalcitonin: <0.1 ng/mL

## 2010-08-08 LAB — POCT I-STAT, CHEM 8
BUN: 35 mg/dL — ABNORMAL HIGH (ref 6–23)
Calcium, Ion: 1.21 mmol/L (ref 1.12–1.32)
Chloride: 93 mEq/L — ABNORMAL LOW (ref 96–112)
HCT: 35 % — ABNORMAL LOW (ref 36.0–46.0)
Sodium: 137 mEq/L (ref 135–145)

## 2010-08-08 LAB — URINALYSIS, ROUTINE W REFLEX MICROSCOPIC
Bilirubin Urine: NEGATIVE
Ketones, ur: NEGATIVE mg/dL
Nitrite: NEGATIVE
Urobilinogen, UA: 0.2 mg/dL (ref 0.0–1.0)

## 2010-08-08 LAB — CBC
MCH: 28.5 pg (ref 26.0–34.0)
MCV: 89.8 fL (ref 78.0–100.0)
Platelets: 218 10*3/uL (ref 150–400)
RDW: 14 % (ref 11.5–15.5)
WBC: 6.9 10*3/uL (ref 4.0–10.5)

## 2010-08-08 LAB — URINE MICROSCOPIC-ADD ON

## 2010-08-08 LAB — LACTIC ACID, PLASMA: Lactic Acid, Venous: 1.4 mmol/L (ref 0.5–2.2)

## 2010-08-09 ENCOUNTER — Inpatient Hospital Stay (HOSPITAL_COMMUNITY): Payer: Medicaid Other

## 2010-08-09 ENCOUNTER — Encounter (HOSPITAL_COMMUNITY): Payer: Self-pay | Admitting: Radiology

## 2010-08-09 DIAGNOSIS — R0602 Shortness of breath: Secondary | ICD-10-CM

## 2010-08-09 LAB — CBC
Hemoglobin: 9.1 g/dL — ABNORMAL LOW (ref 12.0–15.0)
MCH: 28.6 pg (ref 26.0–34.0)
MCHC: 32.3 g/dL (ref 30.0–36.0)
RDW: 14 % (ref 11.5–15.5)

## 2010-08-09 LAB — COMPREHENSIVE METABOLIC PANEL
ALT: 32 U/L (ref 0–35)
AST: 40 U/L — ABNORMAL HIGH (ref 0–37)
CO2: 36 mEq/L — ABNORMAL HIGH (ref 19–32)
Calcium: 9.1 mg/dL (ref 8.4–10.5)
GFR calc Af Amer: 43 mL/min — ABNORMAL LOW (ref >60)
Sodium: 137 mEq/L (ref 135–145)
Total Protein: 6.6 g/dL (ref 6.0–8.3)

## 2010-08-09 LAB — CARDIAC PANEL(CRET KIN+CKTOT+MB+TROPI)
CK, MB: 8.1 ng/mL (ref 0.3–4.0)
Relative Index: 1.4 (ref 0.0–2.5)
Total CK: 405 U/L — ABNORMAL HIGH (ref 7–177)

## 2010-08-09 LAB — GLUCOSE, CAPILLARY
Glucose-Capillary: 131 mg/dL — ABNORMAL HIGH (ref 70–99)
Glucose-Capillary: 124 mg/dL — ABNORMAL HIGH (ref 70–99)
Glucose-Capillary: 165 mg/dL — ABNORMAL HIGH (ref 70–99)

## 2010-08-09 LAB — LIPID PANEL
Cholesterol: 142 mg/dL (ref 0–200)
HDL: 51 mg/dL (ref >39)
LDL Cholesterol: 71 mg/dL (ref 0–99)
Total CHOL/HDL Ratio: 2.8 RATIO

## 2010-08-09 LAB — DIFFERENTIAL
Basophils Relative: 0 % (ref 0–1)
Eosinophils Relative: 1 % (ref 0–5)
Monocytes Absolute: 0.4 10*3/uL (ref 0.1–1.0)
Monocytes Relative: 5 % (ref 3–12)
Neutro Abs: 7.6 10*3/uL (ref 1.7–7.7)

## 2010-08-09 LAB — HEMOGLOBIN A1C: Hgb A1c MFr Bld: 6 % — ABNORMAL HIGH (ref <5.7)

## 2010-08-09 LAB — TSH: TSH: 1.444 u[IU]/mL (ref 0.350–4.500)

## 2010-08-09 LAB — MRSA PCR SCREENING: MRSA by PCR: POSITIVE — AB

## 2010-08-09 LAB — PROTIME-INR: Prothrombin Time: 13.7 seconds (ref 11.6–15.2)

## 2010-08-09 MED ORDER — XENON XE 133 GAS
10.0000 | GAS_FOR_INHALATION | Freq: Once | RESPIRATORY_TRACT | Status: AC | PRN
  Administered 2010-08-09: 9.5 via RESPIRATORY_TRACT

## 2010-08-09 MED ORDER — TECHNETIUM TO 99M ALBUMIN AGGREGATED
5.0000 | Freq: Once | INTRAVENOUS | Status: AC | PRN
  Administered 2010-08-09: 5.3 via INTRAVENOUS

## 2010-08-10 ENCOUNTER — Inpatient Hospital Stay (HOSPITAL_COMMUNITY): Payer: Medicaid Other

## 2010-08-10 LAB — IRON AND TIBC
Iron: 29 ug/dL — ABNORMAL LOW (ref 42–135)
UIBC: 270 ug/dL

## 2010-08-10 LAB — CBC
MCHC: 31.3 g/dL (ref 30.0–36.0)
Platelets: 195 10*3/uL (ref 150–400)
RDW: 13.9 % (ref 11.5–15.5)
WBC: 7.9 10*3/uL (ref 4.0–10.5)

## 2010-08-10 LAB — VITAMIN B12: Vitamin B-12: 638 pg/mL (ref 211–911)

## 2010-08-10 LAB — BASIC METABOLIC PANEL
CO2: 36 mEq/L — ABNORMAL HIGH (ref 19–32)
CO2: 38 mEq/L — ABNORMAL HIGH (ref 19–32)
Chloride: 95 mEq/L — ABNORMAL LOW (ref 96–112)
Creatinine, Ser: 1.04 mg/dL (ref 0.4–1.2)
GFR calc Af Amer: >60 mL/min (ref >60)
Glucose, Bld: 106 mg/dL — ABNORMAL HIGH (ref 70–99)
Glucose, Bld: 99 mg/dL (ref 70–99)
Potassium: 5 mEq/L (ref 3.5–5.1)
Sodium: 139 mEq/L (ref 135–145)

## 2010-08-10 LAB — URINE CULTURE
Colony Count: NO GROWTH
Culture  Setup Time: 201202061002
Culture: NO GROWTH

## 2010-08-10 LAB — GLUCOSE, CAPILLARY

## 2010-08-10 LAB — FERRITIN: Ferritin: 43 ng/mL (ref 10–291)

## 2010-08-11 LAB — CBC
HCT: 28.4 % — ABNORMAL LOW (ref 36.0–46.0)
MCV: 88.8 fL (ref 78.0–100.0)
RBC: 3.2 MIL/uL — ABNORMAL LOW (ref 3.87–5.11)
WBC: 6.3 10*3/uL (ref 4.0–10.5)

## 2010-08-11 LAB — CULTURE, BLOOD (ROUTINE X 2)

## 2010-08-11 LAB — GLUCOSE, CAPILLARY
Glucose-Capillary: 128 mg/dL — ABNORMAL HIGH (ref 70–99)
Glucose-Capillary: 149 mg/dL — ABNORMAL HIGH (ref 70–99)

## 2010-08-11 LAB — BASIC METABOLIC PANEL
BUN: 14 mg/dL (ref 6–23)
Chloride: 92 mEq/L — ABNORMAL LOW (ref 96–112)
Glucose, Bld: 135 mg/dL — ABNORMAL HIGH (ref 70–99)
Potassium: 4.6 mEq/L (ref 3.5–5.1)

## 2010-08-12 LAB — GLUCOSE, CAPILLARY
Glucose-Capillary: 96 mg/dL (ref 70–99)
Glucose-Capillary: 107 mg/dL — ABNORMAL HIGH (ref 70–99)

## 2010-08-15 LAB — CULTURE, BLOOD (ROUTINE X 2)

## 2010-08-15 NOTE — H&P (Signed)
[name], [name]            ACCOUNT NO.:  [id number]  MEDICAL RECORD NO.:  [id number]           PATIENT TYPE:  E  LOCATION:  WLED                         FACILITY:  [facility]  PHYSICIAN:  Michiel Cowboy, MDDATE OF BIRTH:  Mar 08, 1951  DATE OF ADMISSION:  08/08/2010 DATE OF DISCHARGE:                             HISTORY & PHYSICAL   PRIMARY CARE PROVIDER:  Bennett Scrape, MD  CHIEF COMPLAINT:  Shortness of breath and left finger pain.  HISTORY OF PRESENT ILLNESS:  The patient is a 60 year old female with history of diabetes, peripheral vascular disease, COPD, and chronic venous stasis ulcers on her left leg.  The patient is a very poor historian but what I can gather from her as well as her Emergency Department physician, she was brought into the ED initially because of her left finger have been swollen for 1 day and had a centralized ulceration and possibly blister formation.  She denies any fevers butshe did have some shortness of breath, and when she initially present that she was hypoxic, although I am not sure if it was on room air. Because she is usually on 2 liters of oxygen.  Her hypoxia improved when she was put on oxygen and given nebulizer treatment.  She never complained of chest pain per se.  Now she states she is back to her baseline except for her finger.  She also was found to have a swollen left leg with some healed ulcerations, does not appear to be much harder than her right leg, but it does appear to be swollen.  Per patient, her leg is only swollen but she cannot tell me if it is much worse or not than her baseline.  Unfortunately, there is nobody at bedside to compare this to.  Otherwise review of systems is negative per best of my knowledge but again the patient is not very forthcoming.  PAST MEDICAL HISTORY: 1. Significant for: 2. History of the left leg cellulitis. 3. History of diabetes mellitus, this is to be diet controlled. 4. Chronic  obstructive pulmonary disease. 5. Morbid obesity. 6. Sleep apnea, on chronic BiPAP. 7. Bipolar disorder. 8. Hypertension. 9. Glaucoma. 10.Anemia. 11.Gastroesophageal reflux disease. 12.Lymphedema suspect involving the left lower extremity. 13.Peripheral vascular disease. 14.Depression.  PAST SURGICAL HISTORY:  The patient is status post appendectomy, cholecystectomy, hysterectomy and dilatation.  SOCIAL HISTORY:  The patient resides at [facility] rehab.  She does not smoke or drink or abuse drugs.  FAMILY HISTORY:  Noncontributory.  ALLERGIES: 1. NYQUIL. 2. PENICILLIN. 3. NIGHT TIME COLD MEDICINE. 4. TUBERCULIN  PPD.  MEDICATIONS: 1. Aspirin 325 mg daily. 2. Colace 1 pill daily. 3. Cozaar 100 mg daily. 4. Ambien 10 mg at bedtime. 5. Abilify 10 mg daily. 6. Vitamin C. 7. BuSpar 7.5 mg twice daily. 8. Clonidine 0.2 mg twice daily. 9. Cymbalta 60 mg daily. 10.Depakote 500 mg twice a day. 11.Folic acid 1 mg daily. 12.Klonopin 0.5 mg daily. 13.Lasix 20 mg daily. 14.Nasonex 2 sprays daily. 15.Multivitamins daily. 16.Neurontin 300 mg three times daily. 17.Nitroglycerin as needed. 18.Norvasc 10 mg daily. 19.Olopatadine ophthalmic solution  which is on Flomax and social  a     0.2% to both eyes 1 drop daily. 20.Calcium. 21.Percocet as needed for pain. 22.MS Contin 15 mg p.o. b.i.d. 23.Reglan 5 mg three times a day. 24.Senna 6/50 mg 2 tablets daily. 25.simethicone 125 mg 3 times a day. 26.Symbicort 160/4.5 mcg inhaled 2 puffs twice daily. 27.Systane solution to both eyes 1 drop daily. 28.Vitamin D 400 units one capsule daily.  PHYSICAL EXAMINATION:  VITAL SIGNS:  Temperature 99.3, blood pressure 136/48, pulse 77, respirations 20, saturation 99% on 4 liters. GENERAL: The patient appears to be currently in no acute distress, nontraumatic.  Fairly moist mucous membranes. LUNGS:  I do appreciate very minor wheezes and perhaps even minor crackles or more of an upper  airway noise.  Emergency department physician she had much more extensive room, when she first presented. ABDOMEN:  Obese but nontender, nondistended. EXTREMITIES:  Lower Extremities without clubbing, cyanosis, or edema on the right, but her left lower extremity does show signs of lymphedema with chronic swelling.  Does not appear to be redder or hotter than her right lower extremity.  There are changes bilaterally consistent with venous stasis.  There is some healed venous stasis ulcers on the left lower extremity.  The patient states to me her left lower extremity does not look worse than before, but again it is hard to see if she is a good historian concerning this. NEUROLOGICALLY:  Grossly intact. SKIN:  A couple areas of healed break down on the left lower extremity. There is an area of blister centralized small ulceration on finger of her left hand.  There is some redness to the finger as well.  LABS:  White blood cell count 6.9, hemoglobin 11.9, sodium 127, potassium 4.9,  creatinine 2.0 in October 0.78. Lactic acid 1.4, CK-MB is 6.7, with an elevated myoglobin 243.  UA showing many bacteria, but otherwise no evidence of infection.  Chest x-ray, no evidence of pneumonia, but there is poor inspiration.  D-dimer slightly elevated at 0.78.  At this point I am unable to locate an EKG but apparently IT was done.  ASSESSMENT AND PLAN:  This is a 60 year old female with past medical history significant for chronic obstructive pulmonary disease who presented secondary to left finger swelling but also was found to be short of breath, now again unsure of her sats were done on room air. The patient is supposed to be on chronic oxygen.  She was treated with nebulizer treatments, her shortness of breath had markedly improved, and she is currently feeling back to baseline. 1. Cellulitis.  This is definitely acute cellulitis including her left     finger, although this is extremely mild,  now  her left lower     extremity is hard to determine if she has chronic cellulitis or     this is just lymphedema with chronic changes.  She does not have a     white blood cell count, and she is not complaining of worsening     pain in her left leg.  At this time, she does not have a fever.     Mainly, have complaint of constant pain in her left finger.  For     right now, we will treat with zinc. The patient has been exposed to     medical system in nursing facility and will follow to see if there     is any improvement on her finger and see for any changes on her     left lower  extremity.  Also given swelling and shortness of breath,     we will check out Dopplers.  I would recommend keeping left foot     elevated. 2. Shortness of breath.  The patient does have history of COPD.  We     will also evaluate for PE with VQ scan, given elevated D-dimer and     swollen left leg which I am not sure what the duration of this has     been and how is it different from baseline.  Will also cycle     cardiac enzymes.  Continue her chronic obstructive pulmonary     disease treatment and make sure she has albuterol p.r.n. nebulizer     and scheduled Atrovent. This point hold off on steroids as she is     not actively having any significant wheezing at this point. 3. Sleep apnea.  Continue BiPAP. 4. Depression.  Continue home medications. 5. Acute renal failure.  The patient does have elevated creatinine     from her baseline.  Will try gentle IV fluids.  Will do a renal     ultrasound to followup her creatinine.  We will hold on Cozaar. 6. Prophylaxis Protonix.  For now we will put on Lovenox until we can     rule out PE.     Michiel Cowboy, MD     AVD/MEDQ  D:  08/09/2010  T:  08/09/2010  Job:  [id number]  cc:   Bennett Scrape, MD Fax: [phone]  Electronically Signed by Therisa Doyne MD on 08/14/2010 07:26:52 PM

## 2010-08-19 NOTE — Discharge Summary (Signed)
[name], [name] NO.:  [id number]  MEDICAL RECORD NO.:  [id number]           PATIENT TYPE:  I  LOCATION:  1442                         FACILITY:  [facility]  PHYSICIAN:  Altha Harm, MDDATE OF BIRTH:  02-16-1951  DATE OF ADMISSION:  08/08/2010 DATE OF DISCHARGE:  08/12/2010                              DISCHARGE SUMMARY   DISCHARGE DISPOSITION:  [facility].  PRIMARY CARE PHYSICIAN:  Bennett Scrape, M.D., [facility].  FINAL DISCHARGE DIAGNOSES: 1. Cellulitis of the left lower extremity and the left third digit. 2. Chronic hypoxic respiratory failure. 3. Bipolar disorder. 4. Iron-deficiency anemia. 5. Hypertension. 6. Obstructive sleep apnea. 7. Morbid obesity with body mass index greater than 40. 8. Peripheral vascular disease. 9. Diabetes type 2, diet controlled. 10.Acute renal failure, resolved. 11.Glaucoma.  FINAL DISCHARGE MEDICATIONS: 1. Doxycycline 100 mg p.o. b.i.d. x9 days. 2. Abilify 5 mg p.o. daily. 3. Ascorbic acid 500 mg p.o. b.i.d. 4. Ambien 10 mg p.o. at bedtime. 5. Aspirin 325 mg p.o. daily. 6. BuSpar 7.5 mg p.o. b.i.d. 7. Catapres 0.2 mg p.o. b.i.d. 8. Colace 100 mg p.o. daily. 9. Cymbalta 60 mg p.o. daily. 10.Depakote 500 mg p.o. b.i.d. 11.Folic acid 1 mg p.o. daily. 12.Klonopin 0.5 mg p.o. daily. 13.Lasix 20 mg p.o. daily. 14.Nasonex intranasal 2 sprays to each nostril daily. 15.MS Contin 15 mg p.o. b.i.d. 16.Multivitamin 1 tablet p.o. daily. 17.Neurontin 300 mg p.o. t.i.d. 18.Nitroglycerin 0.4 mg sublingual every 5 minutes up to 3 doses     p.r.n. chest pain. 19.Norvasc 10 mg p.o. daily. 20.Olopatadine ophthalmic drops 1 drop to both eyes daily. 21.Os-Cal 500 mg p.o. b.i.d. 22.Percocet 1 tablet p.o. q.6h. p.r.n. pain. 23.Protonix 40 mg p.o. daily. 24.Reglan 5 mg p.o. t.i.d. 25.Simethicone 125 mg p.o. t.i.d. 26.Senokot 2 tablets p.o. daily. 27.Systane solution 1 drop to each  eye daily. 28.Vitamin D 400 mg p.o. daily. 29.Symbicort 160/4.5 two puffs inhaled b.i.d.  DISCONTINUED MEDICATIONS: 1. Cozaar 100 mg p.o. daily. 2. Bactrim DS 1 tablet p.o. b.i.d.  PROCEDURES:  None.  DIAGNOSTIC STUDIES: 1. Portable chest x-ray which shows elevated right hemidiaphragm with     a right basilar scarring or atelectasis.  Borderline cardiomegaly     with possible pulmonary venous hypertension.  Impression is     thoracic spondylosis. 2. Renal ultrasound which shows normal renal size without     hydronephrosis or other sonographic findings. 3. VQ scan which is negative for pulmonary embolus. 4. Two-view chest x-ray on August 09, 2010, which shows no acute     disease. 5. Portable chest x-ray which shows right PICC line with tip overlying     mid lower right atrium.  Mild pulmonary vascular congestion.  CODE STATUS:  Full code.  ALLERGIES: 1. PENICILLIN. 2. DEXTROMETHORPHAN. 3. HYMENOPTER-DERIVED AGENTS.  CHIEF COMPLAINT:  Shortness of breath and left finger pain.  HISTORY OF PRESENT ILLNESS:  Please refer to the H and P by Dr. Adela Glimpse for details of the HPI.  HOSPITAL COURSE: 1. Shortness of breath.  The patient's shortness of breath was felt to     be multifactorial but nonacute.  The patient was ruled out for     pulmonary embolus.  Initially, she had no vascular congestion.     However, her Lasix was stopped because of her renal insufficiency,     and she developed some vascular congestion.  The patient does have     some degree of obesity hypoventilation syndrome as well as     obstructive sleep apnea.  At the present, the patient is at her     baseline of respiratory status.  She has no evidence of a pneumonia     and no evidence of any acute process occurring.  She did receive     some nebulizer treatments here in the hospital.  However, I do not     believe that they have made a significant difference in her care. 2. Cellulitis of the left lower  extremity and the left third digit.     The patient had been on Bactrim DS during the hospitalization.     Blood cultures were done here which revealed diphtheroids which I     believe are contaminant.  Due to the fact that the patient did have     some renal insufficiency while on Bactrim, I have changed the     Bactrim over to doxycycline for coverage of the cellulitis.  The     patient has shown no other chronic clinical function on this and     she will continue on this for 9 days for a total of 14 days. 3. Iron deficiency.  The patient has a low serum iron at 29.  However,     she has normocytic anemia.  She would benefit from oral iron intake     to improve her anemia. 4. Obstructive sleep apnea.  She is continued on her CPAP. 5. Hypertension, controlled.  The patient had been on Cozaar prior to     hospitalization.  However, her renal function was impaired.  At the     time of hospitalization, the Cozaar was withheld.  Even with     withholding the Cozaar, the patient's blood pressures were in a     normotensive range and thus this has been discontinued. 6. Diabetes type 2, diet controlled.  The patient used sliding scale     insulin and required very small doses while hospitalized.  She is     to continue with her diet control for her diabetes. 7. Bipolar disorder and depression.  The patient is continued on her     psychotropic medications as before.  During this hospitalization,     she had a normal mood and disposition.  PHYSICAL EXAMINATION:  GENERAL:  The patient is morbidly obese, however is in no acute distress. VITAL SIGNS:  Temperature is 97.8, heart rate 60, blood pressure 120/66, respiratory rate 18, O2 sats are 98% on 3 L of oxygen.  CBGs are ranging from 96 to 125. HEENT examination:  She is normocephalic, atraumatic.  Pupils are equally round and reactive to light.  Oropharynx is moist.  No exudates, erythema, or lesions are noted. NECK:  Obese, unable to  appreciate any carotid bruit.  However due to the obesity of the neck, it is difficult to evaluate for JVD.  The trachea is midline. RESPIRATORY:  The patient has a normal respiratory effort, equal excursion bilaterally.  Her breath sounds are diminished to the bases. I cannot appreciate any rales, wheezing, or rhonchi. CARDIOVASCULAR:  She has got normal S1-S2.  No murmurs, rubs,  or gallops are noted. ABDOMEN:  Obese, soft, nontender, and nondistended. EXTREMITIES:  The patient has some chronic edema, and the extremities are extremely obese.  DIETARY RESTRICTIONS:  The patient should be on a diabetic heart-healthy diet.  PHYSICAL RESTRICTIONS:  The patient is essentially bed and wheelchair bound and will continue that state, which is her baseline.  FOLLOWUP:  She will follow up with Dr. Marijo Conception at the nursing home within 72 hours.     Altha Harm, MD     MAM/MEDQ  D:  08/12/2010  T:  08/12/2010  Job:  [id number]  cc:   Bennett Scrape, MD Fax: [phone]  Electronically Signed by Marthann Schiller MD on 08/19/2010 12:59:59 PM

## 2010-09-15 ENCOUNTER — Emergency Department (HOSPITAL_COMMUNITY)
Admission: EM | Admit: 2010-09-15 | Discharge: 2010-09-16 | Disposition: A | Discharge status: Home / Self Care | Payer: Medicaid Other | Attending: Emergency Medicine | Admitting: Emergency Medicine

## 2010-09-15 DIAGNOSIS — Y9210 Unspecified residential institution as the place of occurrence of the external cause: Secondary | ICD-10-CM | POA: Insufficient documentation

## 2010-09-15 DIAGNOSIS — I739 Peripheral vascular disease, unspecified: Secondary | ICD-10-CM | POA: Insufficient documentation

## 2010-09-15 DIAGNOSIS — R079 Chest pain, unspecified: Secondary | ICD-10-CM | POA: Insufficient documentation

## 2010-09-15 DIAGNOSIS — M549 Dorsalgia, unspecified: Secondary | ICD-10-CM | POA: Insufficient documentation

## 2010-09-15 DIAGNOSIS — R0602 Shortness of breath: Secondary | ICD-10-CM | POA: Insufficient documentation

## 2010-09-15 DIAGNOSIS — T1490XA Injury, unspecified, initial encounter: Secondary | ICD-10-CM | POA: Insufficient documentation

## 2010-09-15 DIAGNOSIS — W010XXA Fall on same level from slipping, tripping and stumbling without subsequent striking against object, initial encounter: Secondary | ICD-10-CM | POA: Insufficient documentation

## 2010-09-15 DIAGNOSIS — I10 Essential (primary) hypertension: Secondary | ICD-10-CM | POA: Insufficient documentation

## 2010-09-15 DIAGNOSIS — R51 Headache: Secondary | ICD-10-CM | POA: Insufficient documentation

## 2010-09-15 DIAGNOSIS — E119 Type 2 diabetes mellitus without complications: Secondary | ICD-10-CM | POA: Insufficient documentation

## 2010-09-15 LAB — COMPREHENSIVE METABOLIC PANEL
BUN: 11 mg/dL (ref 6–23)
Calcium: 8.8 mg/dL (ref 8.4–10.5)
Creatinine, Ser: 0.85 mg/dL (ref 0.4–1.2)
Glucose, Bld: 106 mg/dL — ABNORMAL HIGH (ref 70–99)
Total Protein: 6.9 g/dL (ref 6.0–8.3)

## 2010-09-15 LAB — CBC
HCT: 29.7 % — ABNORMAL LOW (ref 36.0–46.0)
HCT: 30 % — ABNORMAL LOW (ref 36.0–46.0)
HCT: 31.3 % — ABNORMAL LOW (ref 36.0–46.0)
Hemoglobin: 9.6 g/dL — ABNORMAL LOW (ref 12.0–15.0)
Hemoglobin: 9.8 g/dL — ABNORMAL LOW (ref 12.0–15.0)
MCH: 28.1 pg (ref 26.0–34.0)
MCH: 28.5 pg (ref 26.0–34.0)
MCHC: 31.3 g/dL (ref 30.0–36.0)
MCHC: 32.3 g/dL (ref 30.0–36.0)
MCHC: 32.7 g/dL (ref 30.0–36.0)
MCV: 88.1 fL (ref 78.0–100.0)
MCV: 89.7 fL (ref 78.0–100.0)
Platelets: 191 10*3/uL (ref 150–400)
RBC: 3.37 MIL/uL — ABNORMAL LOW (ref 3.87–5.11)
RDW: 13.6 % (ref 11.5–15.5)
RDW: 13.8 % (ref 11.5–15.5)
WBC: 10.9 10*3/uL — ABNORMAL HIGH (ref 4.0–10.5)

## 2010-09-15 LAB — BASIC METABOLIC PANEL
BUN: 12 mg/dL (ref 6–23)
CO2: 36 mEq/L — ABNORMAL HIGH (ref 19–32)
CO2: 36 mEq/L — ABNORMAL HIGH (ref 19–32)
Calcium: 8.8 mg/dL (ref 8.4–10.5)
Calcium: 8.9 mg/dL (ref 8.4–10.5)
Chloride: 96 mEq/L (ref 96–112)
Creatinine, Ser: 0.8 mg/dL (ref 0.4–1.2)
GFR calc Af Amer: >60 mL/min (ref >60)
GFR calc non Af Amer: >60 mL/min (ref >60)
Glucose, Bld: 106 mg/dL — ABNORMAL HIGH (ref 70–99)
Glucose, Bld: 121 mg/dL — ABNORMAL HIGH (ref 70–99)
Potassium: 4.2 mEq/L (ref 3.5–5.1)
Sodium: 140 mEq/L (ref 135–145)
Sodium: 141 mEq/L (ref 135–145)

## 2010-09-15 LAB — CARDIAC PANEL(CRET KIN+CKTOT+MB+TROPI)
CK, MB: 4 ng/mL (ref 0.3–4.0)
Relative Index: 1 (ref 0.0–2.5)
Total CK: 391 U/L — ABNORMAL HIGH (ref 7–177)
Troponin I: 0.03 ng/mL (ref 0.00–0.06)

## 2010-09-15 LAB — GLUCOSE, CAPILLARY
Glucose-Capillary: 111 mg/dL — ABNORMAL HIGH (ref 70–99)
Glucose-Capillary: 113 mg/dL — ABNORMAL HIGH (ref 70–99)
Glucose-Capillary: 117 mg/dL — ABNORMAL HIGH (ref 70–99)
Glucose-Capillary: 119 mg/dL — ABNORMAL HIGH (ref 70–99)
Glucose-Capillary: 123 mg/dL — ABNORMAL HIGH (ref 70–99)
Glucose-Capillary: 131 mg/dL — ABNORMAL HIGH (ref 70–99)
Glucose-Capillary: 145 mg/dL — ABNORMAL HIGH (ref 70–99)
Glucose-Capillary: 168 mg/dL — ABNORMAL HIGH (ref 70–99)

## 2010-09-16 ENCOUNTER — Emergency Department (HOSPITAL_COMMUNITY): Payer: Medicaid Other

## 2010-09-16 ENCOUNTER — Encounter (HOSPITAL_COMMUNITY): Payer: Self-pay

## 2010-09-16 LAB — COMPREHENSIVE METABOLIC PANEL
AST: 24 U/L (ref 0–37)
Albumin: 3.5 g/dL (ref 3.5–5.2)
BUN: 9 mg/dL (ref 6–23)
CO2: 40 mEq/L — ABNORMAL HIGH (ref 19–32)
Calcium: 9.7 mg/dL (ref 8.4–10.5)
Creatinine, Ser: 0.92 mg/dL (ref 0.4–1.2)
GFR calc Af Amer: >60 mL/min (ref >60)
GFR calc non Af Amer: >60 mL/min (ref >60)

## 2010-09-16 LAB — CBC
MCH: 29.2 pg (ref 26.0–34.0)
MCHC: 32.8 g/dL (ref 30.0–36.0)
MCV: 89 fL (ref 78.0–100.0)
Platelets: 187 10*3/uL (ref 150–400)

## 2010-09-16 LAB — CULTURE, BLOOD (ROUTINE X 2)
Culture  Setup Time: 201109301430
Culture  Setup Time: 201109301430
Culture: NO GROWTH

## 2010-09-16 LAB — DIFFERENTIAL
Basophils Relative: 0 % (ref 0–1)
Eosinophils Absolute: 0.1 10*3/uL (ref 0.0–0.7)
Eosinophils Relative: 1 % (ref 0–5)
Lymphs Abs: 1.6 10*3/uL (ref 0.7–4.0)
Monocytes Absolute: 0.7 10*3/uL (ref 0.1–1.0)
Monocytes Relative: 5 % (ref 3–12)

## 2010-09-16 LAB — URINE CULTURE: Colony Count: 15000

## 2010-09-16 LAB — URINALYSIS, ROUTINE W REFLEX MICROSCOPIC
Glucose, UA: NEGATIVE mg/dL
Hgb urine dipstick: NEGATIVE
Specific Gravity, Urine: 1.013 (ref 1.005–1.030)
pH: 6 (ref 5.0–8.0)

## 2010-09-16 LAB — CARDIAC PANEL(CRET KIN+CKTOT+MB+TROPI)
CK, MB: 3.7 ng/mL (ref 0.3–4.0)
Relative Index: 1 (ref 0.0–2.5)

## 2010-09-16 LAB — CK TOTAL AND CKMB (NOT AT ARMC): Total CK: 339 U/L — ABNORMAL HIGH (ref 7–177)

## 2010-09-16 LAB — HEMOGLOBIN A1C: Hgb A1c MFr Bld: 5.8 % — ABNORMAL HIGH (ref <5.7)

## 2010-09-16 LAB — GLUCOSE, CAPILLARY
Glucose-Capillary: 108 mg/dL — ABNORMAL HIGH (ref 70–99)
Glucose-Capillary: 159 mg/dL — ABNORMAL HIGH (ref 70–99)

## 2010-09-16 LAB — PROTIME-INR: INR: 1.04 (ref 0.00–1.49)

## 2010-09-16 LAB — MRSA PCR SCREENING: MRSA by PCR: POSITIVE — AB

## 2010-09-16 LAB — TROPONIN I: Troponin I: 0.02 ng/mL (ref 0.00–0.06)

## 2010-09-22 LAB — DIFFERENTIAL
Basophils Absolute: 0 10*3/uL (ref 0.0–0.1)
Basophils Relative: 0 % (ref 0–1)
Basophils Relative: 0 % (ref 0–1)
Eosinophils Absolute: 0.2 10*3/uL (ref 0.0–0.7)
Eosinophils Absolute: 0.3 10*3/uL (ref 0.0–0.7)
Monocytes Absolute: 0.7 10*3/uL (ref 0.1–1.0)
Monocytes Absolute: 1.1 10*3/uL — ABNORMAL HIGH (ref 0.1–1.0)
Monocytes Relative: 8 % (ref 3–12)
Neutrophils Relative %: 70 % (ref 43–77)
Neutrophils Relative %: 71 % (ref 43–77)
Smear Review: ADEQUATE

## 2010-09-22 LAB — D-DIMER, QUANTITATIVE: D-Dimer, Quant: 2.09 ug/mL-FEU — ABNORMAL HIGH (ref 0.00–0.48)

## 2010-09-22 LAB — CBC
HCT: 29.5 % — ABNORMAL LOW (ref 36.0–46.0)
Hemoglobin: 9.6 g/dL — ABNORMAL LOW (ref 12.0–15.0)
Hemoglobin: 9.6 g/dL — ABNORMAL LOW (ref 12.0–15.0)
MCHC: 33.1 g/dL (ref 30.0–36.0)
MCV: 88.8 fL (ref 78.0–100.0)
MCV: 89 fL (ref 78.0–100.0)
RBC: 3.27 MIL/uL — ABNORMAL LOW (ref 3.87–5.11)
RBC: 3.31 MIL/uL — ABNORMAL LOW (ref 3.87–5.11)
RDW: 16 % — ABNORMAL HIGH (ref 11.5–15.5)
WBC: 15.1 10*3/uL — ABNORMAL HIGH (ref 4.0–10.5)

## 2010-09-22 LAB — URINALYSIS, ROUTINE W REFLEX MICROSCOPIC
Bilirubin Urine: NEGATIVE
Ketones, ur: NEGATIVE mg/dL
Nitrite: NEGATIVE
Specific Gravity, Urine: 1.015 (ref 1.005–1.030)
Urobilinogen, UA: 0.2 mg/dL (ref 0.0–1.0)

## 2010-09-22 LAB — CARDIAC PANEL(CRET KIN+CKTOT+MB+TROPI)
CK, MB: 7.7 ng/mL (ref 0.3–4.0)
Total CK: 324 U/L — ABNORMAL HIGH (ref 7–177)
Total CK: 372 U/L — ABNORMAL HIGH (ref 7–177)
Troponin I: 0.01 ng/mL (ref 0.00–0.06)

## 2010-09-22 LAB — COMPREHENSIVE METABOLIC PANEL
Albumin: 2.7 g/dL — ABNORMAL LOW (ref 3.5–5.2)
Alkaline Phosphatase: 80 U/L (ref 39–117)
BUN: 23 mg/dL (ref 6–23)
CO2: 36 mEq/L — ABNORMAL HIGH (ref 19–32)
Chloride: 96 mEq/L (ref 96–112)
Creatinine, Ser: 1.05 mg/dL (ref 0.4–1.2)
GFR calc non Af Amer: 53 mL/min — ABNORMAL LOW (ref >60)
Glucose, Bld: 110 mg/dL — ABNORMAL HIGH (ref 70–99)
Potassium: 4.8 mEq/L (ref 3.5–5.1)
Total Bilirubin: 0.4 mg/dL (ref 0.3–1.2)

## 2010-09-22 LAB — BRAIN NATRIURETIC PEPTIDE: Pro B Natriuretic peptide (BNP): 92 pg/mL (ref 0.0–100.0)

## 2010-09-22 LAB — POCT CARDIAC MARKERS
CKMB, poc: 3.9 ng/mL (ref 1.0–8.0)
CKMB, poc: 8.9 ng/mL (ref 1.0–8.0)
Myoglobin, poc: >500 ng/mL (ref 12–200)
Troponin i, poc: <0.05 ng/mL (ref 0.00–0.09)

## 2010-09-22 LAB — CK TOTAL AND CKMB (NOT AT ARMC): CK, MB: 5.8 ng/mL — ABNORMAL HIGH (ref 0.3–4.0)

## 2010-10-06 LAB — BASIC METABOLIC PANEL
CO2: 33 mEq/L — ABNORMAL HIGH (ref 19–32)
Glucose, Bld: 101 mg/dL — ABNORMAL HIGH (ref 70–99)
Potassium: 4.2 mEq/L (ref 3.5–5.1)
Sodium: 139 mEq/L (ref 135–145)

## 2010-10-06 LAB — CBC
HCT: 35.3 % — ABNORMAL LOW (ref 36.0–46.0)
Hemoglobin: 11.7 g/dL — ABNORMAL LOW (ref 12.0–15.0)
MCHC: 33.3 g/dL (ref 30.0–36.0)
MCV: 89.4 fL (ref 78.0–100.0)
RDW: 15.2 % (ref 11.5–15.5)

## 2010-10-06 LAB — GLUCOSE, CAPILLARY
Glucose-Capillary: 89 mg/dL (ref 70–99)
Glucose-Capillary: 95 mg/dL (ref 70–99)

## 2010-10-06 LAB — DIFFERENTIAL
Basophils Absolute: 0 10*3/uL (ref 0.0–0.1)
Basophils Relative: 0 % (ref 0–1)
Eosinophils Relative: 1 % (ref 0–5)
Monocytes Absolute: 0.5 10*3/uL (ref 0.1–1.0)

## 2010-10-12 LAB — BASIC METABOLIC PANEL
BUN: 12 mg/dL (ref 6–23)
CO2: 33 mEq/L — ABNORMAL HIGH (ref 19–32)
CO2: 36 mEq/L — ABNORMAL HIGH (ref 19–32)
CO2: 40 mEq/L — ABNORMAL HIGH (ref 19–32)
Calcium: 9.3 mg/dL (ref 8.4–10.5)
Calcium: 9.4 mg/dL (ref 8.4–10.5)
Chloride: 92 mEq/L — ABNORMAL LOW (ref 96–112)
Creatinine, Ser: 0.94 mg/dL (ref 0.4–1.2)
Creatinine, Ser: 0.86 mg/dL (ref 0.4–1.2)
Creatinine, Ser: 0.89 mg/dL (ref 0.4–1.2)
GFR calc Af Amer: >60 mL/min (ref >60)
Glucose, Bld: 97 mg/dL (ref 70–99)

## 2010-10-12 LAB — DIFFERENTIAL
Basophils Absolute: 0 10*3/uL (ref 0.0–0.1)
Basophils Relative: 0 % (ref 0–1)
Eosinophils Absolute: 0.1 10*3/uL (ref 0.0–0.7)
Neutro Abs: 10.5 10*3/uL — ABNORMAL HIGH (ref 1.7–7.7)
Neutrophils Relative %: 78 % — ABNORMAL HIGH (ref 43–77)

## 2010-10-12 LAB — CK TOTAL AND CKMB (NOT AT ARMC)
CK, MB: 2.8 ng/mL (ref 0.3–4.0)
CK, MB: 2.9 ng/mL (ref 0.3–4.0)
CK, MB: 3.4 ng/mL (ref 0.3–4.0)
CK, MB: 3.1 ng/mL (ref 0.3–4.0)
CK, MB: 3.5 ng/mL (ref 0.3–4.0)
CK, MB: 3.8 ng/mL (ref 0.3–4.0)
CK, MB: 4.5 ng/mL — ABNORMAL HIGH (ref 0.3–4.0)
Relative Index: 1.3 (ref 0.0–2.5)
Relative Index: 1.5 (ref 0.0–2.5)
Relative Index: 1.5 (ref 0.0–2.5)
Relative Index: 1.6 (ref 0.0–2.5)
Relative Index: 1 (ref 0.0–2.5)
Relative Index: 1.1 (ref 0.0–2.5)
Relative Index: 1.2 (ref 0.0–2.5)
Relative Index: 1.4 (ref 0.0–2.5)
Total CK: 181 U/L — ABNORMAL HIGH (ref 7–177)
Total CK: 187 U/L — ABNORMAL HIGH (ref 7–177)
Total CK: 194 U/L — ABNORMAL HIGH (ref 7–177)
Total CK: 220 U/L — ABNORMAL HIGH (ref 7–177)
Total CK: 198 U/L — ABNORMAL HIGH (ref 7–177)
Total CK: 218 U/L — ABNORMAL HIGH (ref 7–177)
Total CK: 237 U/L — ABNORMAL HIGH (ref 7–177)
Total CK: 320 U/L — ABNORMAL HIGH (ref 7–177)

## 2010-10-12 LAB — CBC
HCT: 34.6 % — ABNORMAL LOW (ref 36.0–46.0)
Hemoglobin: 11.6 g/dL — ABNORMAL LOW (ref 12.0–15.0)
MCHC: 33.7 g/dL (ref 30.0–36.0)
MCHC: 33.3 g/dL (ref 30.0–36.0)
MCHC: 33.5 g/dL (ref 30.0–36.0)
MCHC: 33.8 g/dL (ref 30.0–36.0)
MCV: 87.1 fL (ref 78.0–100.0)
MCV: 87.4 fL (ref 78.0–100.0)
Platelets: 193 10*3/uL (ref 150–400)
Platelets: 161 10*3/uL (ref 150–400)
RBC: 4.18 MIL/uL (ref 3.87–5.11)
RBC: 3.58 MIL/uL — ABNORMAL LOW (ref 3.87–5.11)
RDW: 14.6 % (ref 11.5–15.5)
RDW: 14.8 % (ref 11.5–15.5)
RDW: 14.9 % (ref 11.5–15.5)
WBC: 10.4 10*3/uL (ref 4.0–10.5)

## 2010-10-12 LAB — GLUCOSE, CAPILLARY
Glucose-Capillary: 101 mg/dL — ABNORMAL HIGH (ref 70–99)
Glucose-Capillary: 113 mg/dL — ABNORMAL HIGH (ref 70–99)
Glucose-Capillary: 120 mg/dL — ABNORMAL HIGH (ref 70–99)
Glucose-Capillary: 132 mg/dL — ABNORMAL HIGH (ref 70–99)
Glucose-Capillary: 100 mg/dL — ABNORMAL HIGH (ref 70–99)
Glucose-Capillary: 105 mg/dL — ABNORMAL HIGH (ref 70–99)
Glucose-Capillary: 108 mg/dL — ABNORMAL HIGH (ref 70–99)
Glucose-Capillary: 123 mg/dL — ABNORMAL HIGH (ref 70–99)
Glucose-Capillary: 144 mg/dL — ABNORMAL HIGH (ref 70–99)
Glucose-Capillary: 88 mg/dL (ref 70–99)
Glucose-Capillary: 98 mg/dL (ref 70–99)

## 2010-10-12 LAB — POCT CARDIAC MARKERS
Myoglobin, poc: 244 ng/mL (ref 12–200)
Troponin i, poc: <0.05 ng/mL (ref 0.00–0.09)

## 2010-10-12 LAB — POCT I-STAT 3, ART BLOOD GAS (G3+)
Acid-Base Excess: 10 mmol/L — ABNORMAL HIGH (ref 0.0–2.0)
Acid-Base Excess: 13 mmol/L — ABNORMAL HIGH (ref 0.0–2.0)
O2 Saturation: 97 %
pCO2 arterial: 73.2 mmHg (ref 35.0–45.0)
pH, Arterial: 7.328 — ABNORMAL LOW (ref 7.350–7.400)
pO2, Arterial: 84 mmHg (ref 80.0–100.0)
pO2, Arterial: 95 mmHg (ref 80.0–100.0)

## 2010-10-12 LAB — COMPREHENSIVE METABOLIC PANEL
ALT: 11 U/L (ref 0–35)
AST: 22 U/L (ref 0–37)
Albumin: 3.1 g/dL — ABNORMAL LOW (ref 3.5–5.2)
Alkaline Phosphatase: 64 U/L (ref 39–117)
BUN: 13 mg/dL (ref 6–23)
CO2: 38 mEq/L — ABNORMAL HIGH (ref 19–32)
Calcium: 8.8 mg/dL (ref 8.4–10.5)
Chloride: 93 mEq/L — ABNORMAL LOW (ref 96–112)
Creatinine, Ser: 0.85 mg/dL (ref 0.4–1.2)
GFR calc Af Amer: >60 mL/min (ref >60)
Glucose, Bld: 123 mg/dL — ABNORMAL HIGH (ref 70–99)
Potassium: 3.5 mEq/L (ref 3.5–5.1)
Sodium: 141 mEq/L (ref 135–145)
Total Bilirubin: 0.3 mg/dL (ref 0.3–1.2)

## 2010-10-12 LAB — CULTURE, BLOOD (ROUTINE X 2): Culture: NO GROWTH

## 2010-10-12 LAB — POCT I-STAT, CHEM 8
Creatinine, Ser: 1.1 mg/dL (ref 0.4–1.2)
Glucose, Bld: 114 mg/dL — ABNORMAL HIGH (ref 70–99)
Hemoglobin: 12.9 g/dL (ref 12.0–15.0)
Potassium: 3.8 mEq/L (ref 3.5–5.1)

## 2010-10-12 LAB — URINALYSIS, ROUTINE W REFLEX MICROSCOPIC
Bilirubin Urine: NEGATIVE
Glucose, UA: NEGATIVE mg/dL
Hgb urine dipstick: NEGATIVE
Ketones, ur: NEGATIVE mg/dL
pH: 8.5 — ABNORMAL HIGH (ref 5.0–8.0)

## 2010-10-12 LAB — APTT: aPTT: 30 seconds (ref 24–37)

## 2010-10-12 LAB — POCT I-STAT 3, VENOUS BLOOD GAS (G3P V)
Bicarbonate: 37.8 mEq/L — ABNORMAL HIGH (ref 20.0–24.0)
Patient temperature: 98.9
TCO2: 40 mmol/L (ref 0–100)
pCO2, Ven: 74.5 mmHg (ref 45.0–50.0)
pH, Ven: 7.314 — ABNORMAL HIGH (ref 7.250–7.300)
pO2, Ven: 44 mmHg (ref 30.0–45.0)

## 2010-10-12 LAB — PROTIME-INR
INR: 1 (ref 0.00–1.49)
Prothrombin Time: 12.9 seconds (ref 11.6–15.2)

## 2010-10-12 LAB — URINE CULTURE
Colony Count: NO GROWTH
Culture: NO GROWTH

## 2010-10-12 LAB — URINE MICROSCOPIC-ADD ON

## 2010-10-12 LAB — TROPONIN I: Troponin I: 0.02 ng/mL (ref 0.00–0.06)

## 2010-10-12 LAB — LACTIC ACID, PLASMA: Lactic Acid, Venous: 1.1 mmol/L (ref 0.5–2.2)

## 2010-10-12 LAB — VANCOMYCIN, TROUGH: Vancomycin Tr: 30.9 ug/mL (ref 10.0–20.0)

## 2010-10-14 IMAGING — CR DG CHEST 2V
1 series · 1 of 1 positions shown · non-contrast
Comparison: 05/14/2009.

CLINICAL DATA: Chest pain.

CHEST - 2 VIEW

[w chest lat]
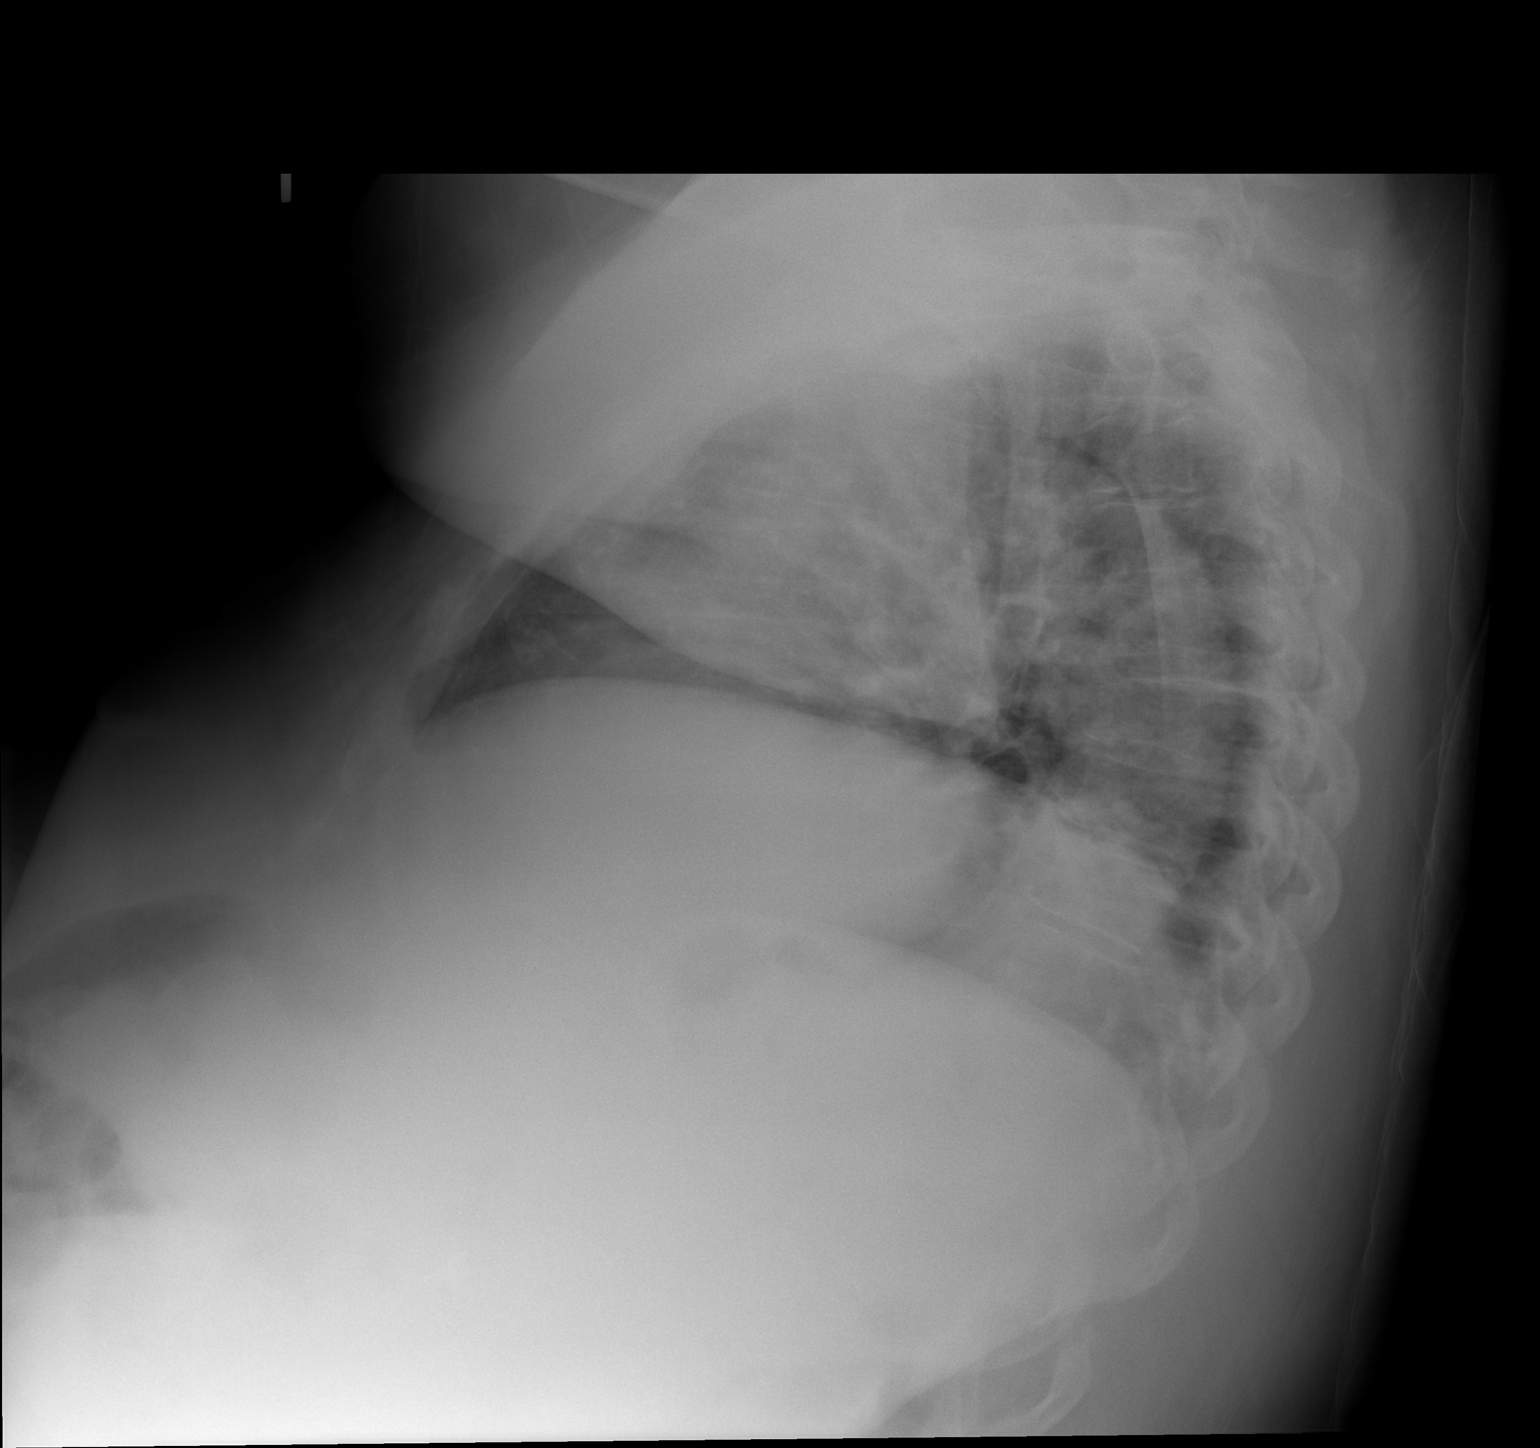

[1 of 1 positions shown; findings below may reference images not displayed]

FINDINGS: Low lung volumes are present with bilateral basilar
atelectasis, right greater than left.  Cardiopericardial silhouette
unchanged, within normal limits allowing for low volumes of
inspiration.  Right AC joint osteoarthritis.  No effusion or
airspace disease is identified.  Suboptimal lateral view because
the arm was not raised over head.
IMPRESSION: Low volume chest.  Bilateral basilar atelectasis.  Chronic
elevation of the right hemidiaphragm is unchanged.

## 2010-10-14 IMAGING — CT CT ANGIO CHEST
2 of 7 series · 14 of 36 positions shown · IV contrast (APPLIED)
Comparison: 11/15/2008 and 08/11/2007.

CLINICAL DATA: Chest pain and shortness of breath.  Elevated D-
dimer.

CT ANGIOGRAPHY CHEST WITH CONTRAST
TECHNIQUE: Multidetector CT imaging of the chest was performed
using the standard protocol during bolus administration of
intravenous contrast.  Multiplanar CT image reconstructions
including MIPs were obtained to evaluate the vascular anatomy.
Contrast:  100 ml intravenous Omnipaque 350

[Series 8: pulm embolism 1.0 b25f thins · axial · 0.70mm/px · z∈[-226,-46]mm · 13 of 211 slices shown]
[im 16/211  lung]
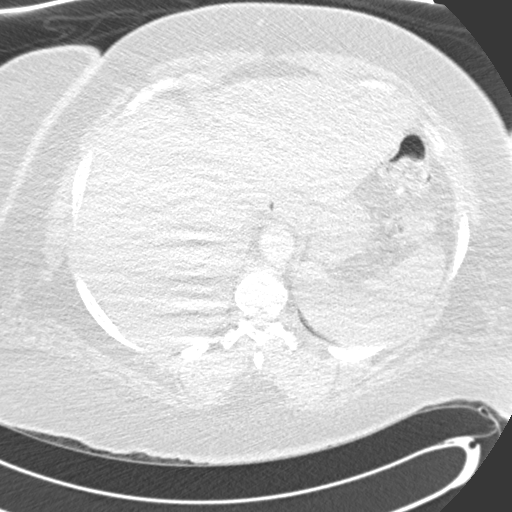
[im 31/211  mediastinal]
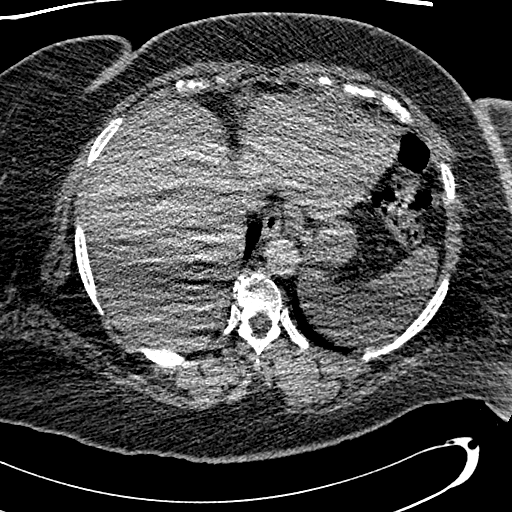
[im 46/211  lung]
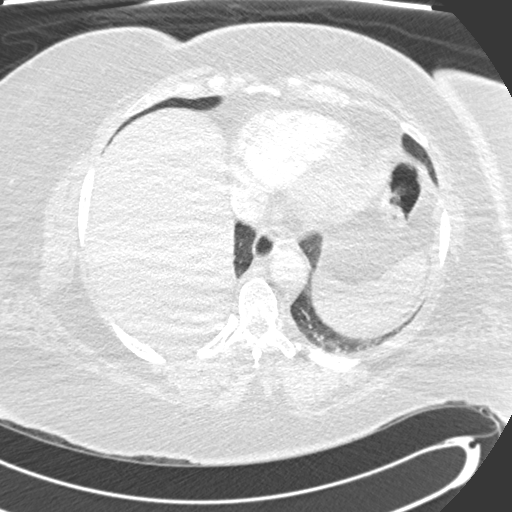
[im 61/211  mediastinal]
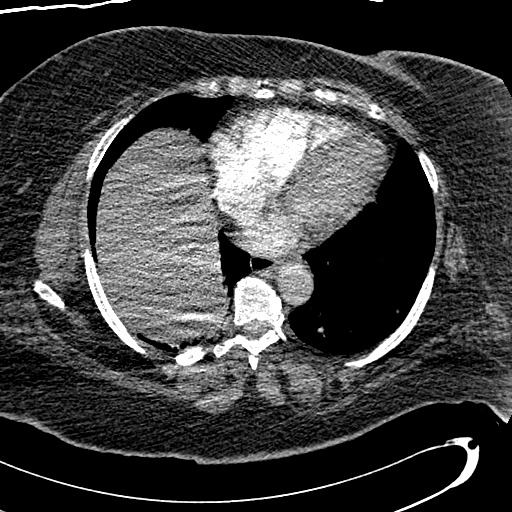
[im 76/211  lung]
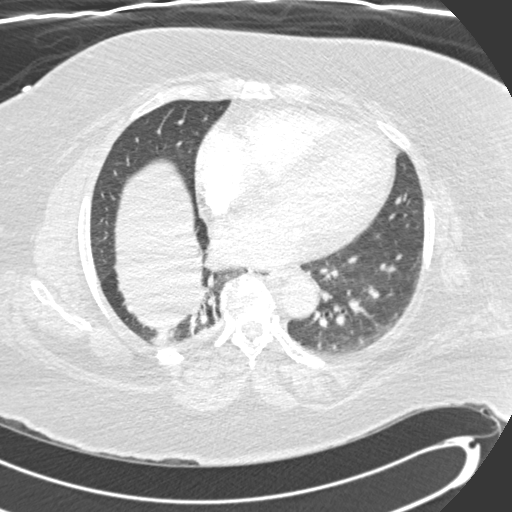
[im 91/211  mediastinal]
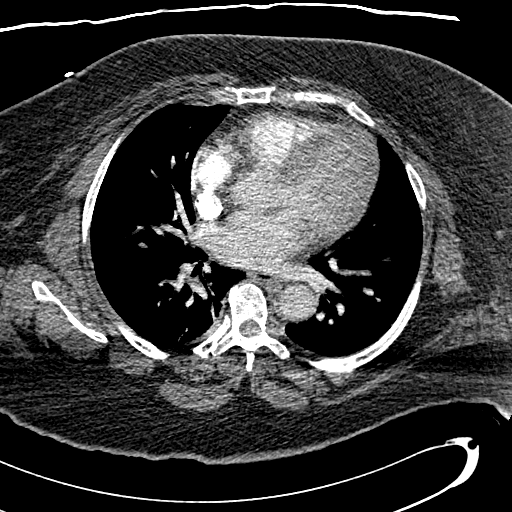
[im 106/211  lung]
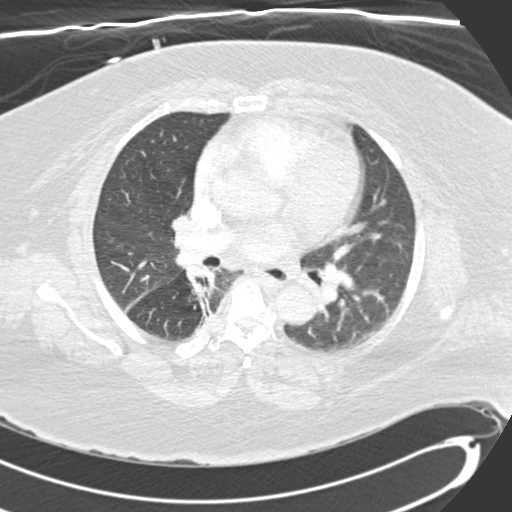
[im 121/211  mediastinal]
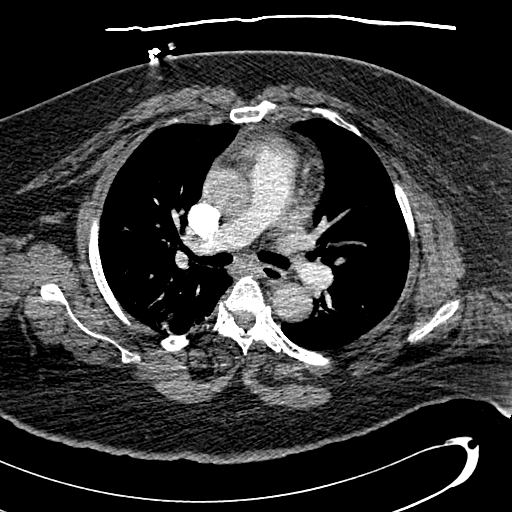
[im 136/211  lung]
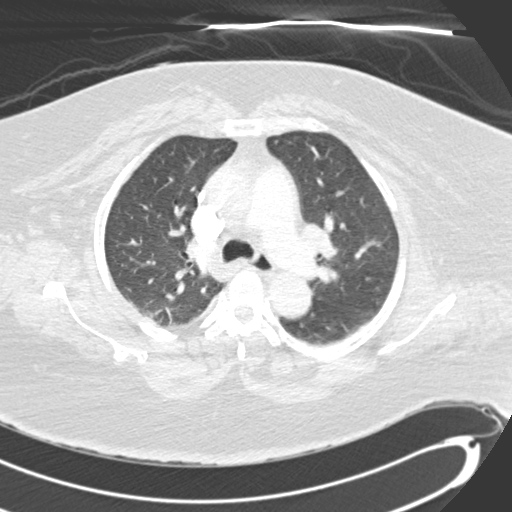
[im 151/211  mediastinal]
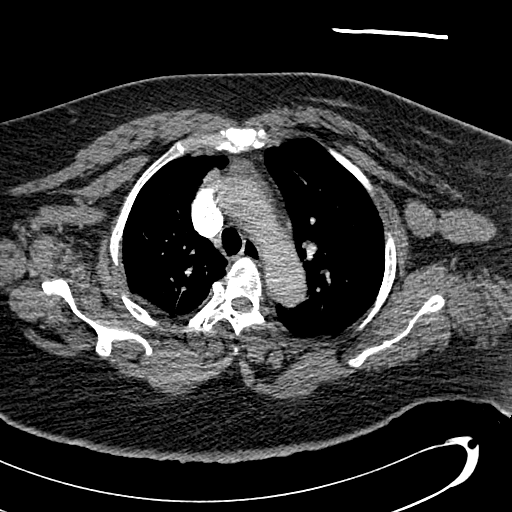
[im 166/211  lung]
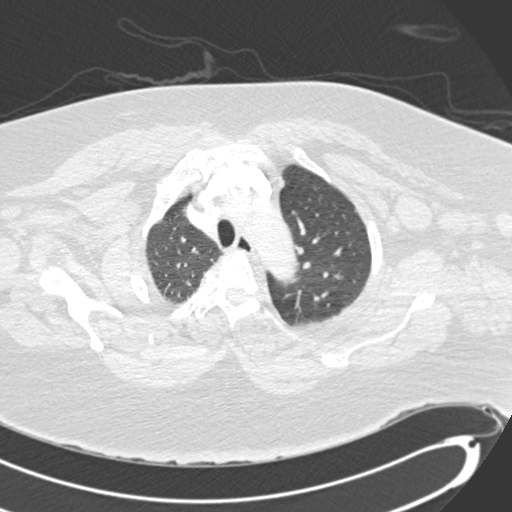
[im 181/211  mediastinal]
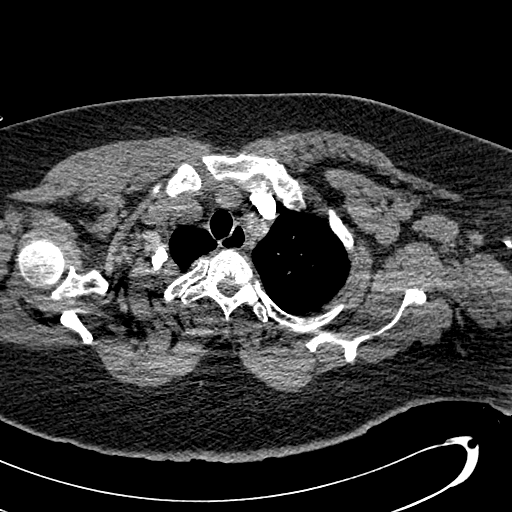
[im 196/211  lung]
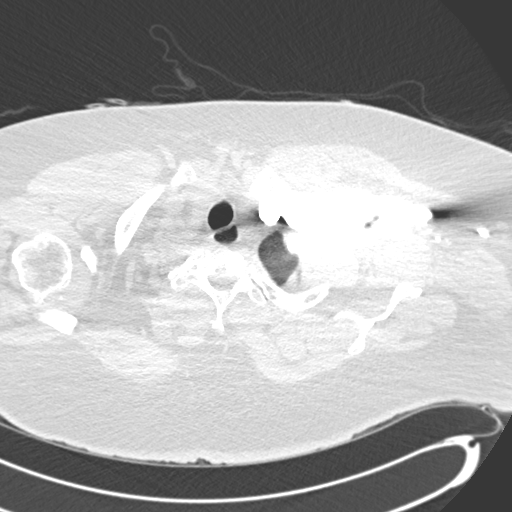

[Series 602: cor mpr · coronal · 0.70mm/px · 1 of 94 slices shown]
[im 47/94  mediastinal]
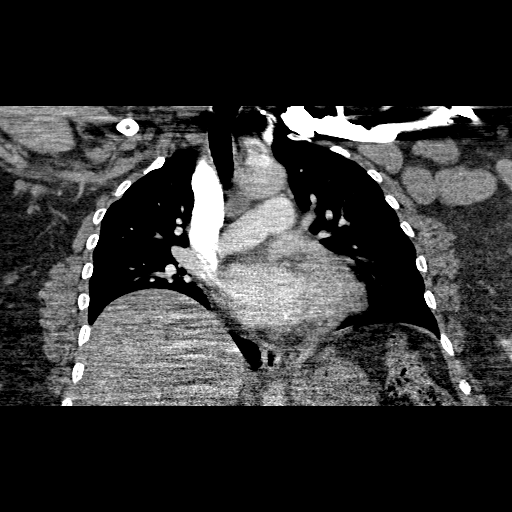

[14 of 36 positions shown; findings below may reference images not displayed]

FINDINGS: This is a technically adequate study but sensitivity is
slightly decreased secondary to patient body habitus and contrast
opacification.

No pulmonary emboli are identified.
There is no evidence of thoracic aortic aneurysm.
Mild cardiomegaly is again identified.

No pleural or pericardial effusions are visualized.

Multiple enlarged left axillary lymph nodes have not significantly
changed since 08/11/2007.
No other enlarged lymph nodes within the chest are identified.

Right lower lung scarring is again identified.
There is no evidence of focal airspace disease, pulmonary
nodules/masses, or consolidation.
No endobronchial or endotracheal lesions are identified.

No acute or suspicious bony abnormalities are identified.

Review of the MIP images confirms the above findings.
IMPRESSION: No evidence of pulmonary emboli or thoracic aortic aneurysm.

Stable left axillary lymphadenopathy since [DATE].

Right lower lung scarring.

## 2010-11-16 NOTE — H&P (Signed)
[name], [name] NO.:  [id number]   MEDICAL RECORD NO.:  [id number]          PATIENT TYPE:  INP   LOCATION:  5501                         FACILITY:  [facility]   PHYSICIAN:  Jackie Plum, M.D.DATE OF BIRTH:  January 21, 1951   DATE OF ADMISSION:  06/18/2007  DATE OF DISCHARGE:                              HISTORY & PHYSICAL   HISTORY:  [name] presented to the emergency department with  shortness of breath.  She had been discharged from the hospital on  May 14, 2007, for chronic obstructive pulmonary disease with  atypical chest pain, for which a cardiac workup was negative.  The  patient presents with several days of shortness of breath and wheezing,  without any fever or chills.  She presented with a cough which is non-  productive.  She has had some chest tightness.  She does not have any  increasing swelling of her lower extremities.  No dizziness, no PND, no  orthopnea.  The patient is seen by the emergency department physician,  Dr. Lorre Nick, whereupon an x-ray of her chest was done, which  showed no pneumonic infiltrate.  A CBC did not show any leukocytosis.  The patient's O2 saturation on room air was 92%.  The patient was given  nebulizer treatments, but Dr. Freida Busman felt that the patient should be  admitted, at least to be watched overnight, because of her persistent  wheezing.   PAST MEDICAL HISTORY:  1. Positive for history of lower extremity pressure ulcers.  2. Bipolar disorder.  3. Morbid obesity.  4. Chronic obstructive pulmonary disease.  5. Diabetes mellitus.  6. Depression and anxiety.  7. Gastroesophageal reflux disease.  8. Hypertension.  9. Normocytic anemia.  10.Chronic abdominal pain.  11.Dyspepsia.   PAST SURGICAL HISTORY:  1. She is status post cholecystectomy.  2. Appendectomy.  3. Hysterectomy.  4. Carpal tunnel syndrome surgery.   CURRENT MEDICATIONS:  Were reviewed by me and signed by me as commented  on the  medication reconciliation form.   ALLERGIES:  PENICILLIN AND NYQUIL, which cause her to itch.   SOCIAL HISTORY:  The patient is a resident of a nursing home.  Does not  smoke cigarettes or drink alcohol.   REVIEW OF SYSTEMS:  Significant for positives and negatives as listed  above.  Otherwise unremarkable and attempted to make an inquiry.   PHYSICAL EXAMINATION:  VITAL SIGNS:  Blood pressure 146/74, pulse 89,  respirations 20, temperature 98.5 degrees F.  The patient's fingerstick  glucose was 159.  She had an O2 saturation of 90% on 2 liters of oxygen  by nasal cannula.  GENERAL:  The patient is a morbidly obese lady sitting in a chair.  She  was comfortable and not in distress.  HEENT:  Oropharynx moist.  Sclerae with pallor present.  NECK:  Supple, no jugular venous distention.  LUNGS:  Diminished breath sounds.  Kussmaul's wheezes.  HEART:  No gallops.  ABDOMEN:  Obese, positive bowel sounds.  EXTREMITIES:  With stasis dermatitis chronically and some pitting edema  which is minimal.  NEUROLOGIC:  Alert and appropriate.   LABORATORY DATA:  X-ray of the chest as listed above.   White blood cell count 8, hemoglobin 11.2, hematocrit 33, MCV 91.9,  platelet count  210.  D-dimer 0.47.  Sodium 140, potassium 4, chloride  98, CO2 of 41,  glucose 110, BUN 15, creatinine 1.06.  GF 160.  The rest  of the complete metabolic panel was within normal limits.  Point of care  cardiac markers:  CK-MB 3.2, troponin I less than 0.05, myoglobin 216.  B-type.  Total peptide was 1130.   DISPOSITION:  The patient is admitted for nebulizations, anti-tussives  and antibiotic.  Hopefully the patient can be discharged back to the  nursing home in the morning.      Jackie Plum, M.D.  Electronically Signed     GO/MEDQ  D:  06/19/2007  T:  06/19/2007  Job:  [id number]

## 2010-11-16 NOTE — Discharge Summary (Signed)
NAMEMILEE, QUALLS NO.:  [id number]   MEDICAL RECORD NO.:  [id number]          PATIENT TYPE:  INP   LOCATION:  4713                         FACILITY:  [facility]   PHYSICIAN:  Eduard Clos, MDDATE OF BIRTH:  May 04, 1951   DATE OF ADMISSION:  11/13/2008  DATE OF DISCHARGE:                               DISCHARGE SUMMARY   PRIMARY CARE PHYSICIAN:  Dr. Herb Grays. [place].   HOSPITAL COURSE:  Please refer to the history and physical dictated by  Dr. Rachael Fee Prime for the admission physical.  This 60 year old female  with known history of COPD, hypertension, diabetes mellitus type 2, and  obstructive sleep apnea was admitted after the patient was having  shortness of breath.  The patient had a chest x-ray on admission which  showed a significant bibasilar infiltrate with possible associated  effusion bilaterally which were new compared to the one done previously.  The patient was admitted to the ICU and started on empiric antibiotics.  The patient also was resumed with her home medications.  The patient's  condition gradually improved.  Cultures were obtained at admission,  blood cultures and urine cultures.  All were negative for any growth.  As the patient's condition improved, the patient was transferred to a  telemetry floor.  A CT of the chest was done due to opacity in the right  lower lobe which showed a small right pleural effusion and right lower  lobe atelectasis.  The patient also had pathologically enlarged left  axillary lymph nodes.  At this time, it was discussed with Dr. Lowella Dandy of  interventional radiology who was going to schedule the patient for a  core biopsy in the morning, tomorrow, May 18 after which the patient  will be discharged home, the result of which has to be followed by the  primary care physician and the patient is agreeable to the plan to have  biopsy done.  At this time, the patient's shortness of breath has  significantly improved.   At the time of this dictation, the patient is  hemodynamically stable.   PROCEDURES DONE:  1. Chest x-ray on Nov 13, 2008 showed bilateral air space opacities      suspicious for bilateral pneumonia or acute pulmonary edema, less      likely, mild cardiomegaly and low lung volumes.  2. Chest x-ray on Nov 14, 2008.  Improved aeration, haziness at the      base, right greater than left may reflect atelectasis or basilar      pneumonia.  3. CT chest on Nov 15, 2008 showed small right pleural effusion, right      lower lobe atelectasis and consolidation, pathologically enlarged      left axillary lymph nodes.  Would advise percutaneous biopsy to      evaluate for either breast cancer, metastasis or lymphoma.   PERTINENT LABORATORY DATA:  Blood cultures and urine cultures were all  negative.  WBC on Nov 16, 2008 was 7.1, on admission, it was 10.0.  Creatinine was 0.94 on May 16.   FINAL DIAGNOSIS:  1. Health care associated pneumonia.  2.  Left axillary lymphadenopathy, for biopsy on Nov 18, 2008.  3. Hypertension.  4. Chronic obstructive pulmonary disease.  5. Diabetes mellitus type 2.  6. Obstructive sleep apnea.   DISCHARGE MEDICATIONS:  1. Avelox 400 mg p.o. daily for seven days.  2. Lasix 20 mg p.o. daily.  3. Folic acid 1 mg p.o. daily.  4. Norvasc 20 mg p.o. daily.  5. Bayer aspirin 325 mg p.o. daily.  6. Nasonex nasal spray 2 sprays each nostril daily.  7. Olopatadine 2% eye drops daily.  8. Protonix 40 mg p.o. daily.  9. Colace one tablet p.o. daily.  10.Cymbalta 50 mg p.o. daily.  11.Xanax XR 1 mg p.o. daily p.r.n. for anxiety.  12.Symbicort 160/4.5 two puffs q.12 hours daily.  13.Neurontin 300 mg p.o. daily.  14.Reglan 5 mg p.o. three times daily.  15.Motrin 800 mg p.o. three times daily p.r.n. for pain.  16.Depakote 750 mg p.o. at bedtime.  17.Calcium with vitamin D 500 mg twice daily.  18.Risperdal 0.5 mg daily.  19.Mylanta 30 mL p.o. p.r.n. for gastritis.   20.Tramadol 50 mg p.o. p.r.n. q.4 hours for pain.  21.Albuterol 2 puffs q.6 hours p.r.n. for shortness of breath.  22.Benadryl 25 mg p.o. q.4 hours p.r.n. for itching.  23.Percocet 5/325 mg p.o. q.4-6 hours p.r.n. for pain.  24.Ambien 10 mg p.o. at bedtime p.r.n. for sleep.  25.NovoLog insulin sliding scale premeals t.i.d., 151-200, 2 units      subcutaneous, 201-250, 4 units subcutaneous, 251-300, 6 units      subcutaneous, 301-350, 8 units subcutaneous, 351-400, 10 units      subcutaneous.   PLAN:  The patient is to get a lymph node biopsy of the left axillary  lymphadenopathy through interventional radiology, Dr. Lowella Dandy on Nov 18, 2008 in the a.m. after which the patient will be discharged to a skilled  nursing facility.  The patient's blood sugar has to be checked a.c. and  h.s., cautious about hypoglycemia.  The patient is to be on a cardiac  healthy, carb modified diet and CPAP as she was before.  The patient's  activity is as tolerated with fall precautions.  She is to follow up  with Dr. Marijo Conception within a week's time and follow the pending biopsy  results for her lymph nodes and further management through her PCP.      Eduard Clos, MD  Electronically Signed     ANK/MEDQ  D:  11/17/2008  T:  11/17/2008  Job:  [id number]   cc:   Bennett Scrape, MD

## 2010-11-16 NOTE — Discharge Summary (Signed)
[name], [name] NO.:  [id number]   MEDICAL RECORD NO.:  [id number]          PATIENT TYPE:  OBV   LOCATION:  5501                         FACILITY:  [facility]   PHYSICIAN:  Jackie Plum, M.D.DATE OF BIRTH:  Aug 03, 1950   DATE OF ADMISSION:  06/18/2007  DATE OF DISCHARGE:  06/20/2007                               DISCHARGE SUMMARY   DISCHARGE DIAGNOSES:  1. Chronic obstructive pulmonary disease.  2. Diabetes mellitus.  3. Morbid obesity.  4. Bipolar disorder.  5. Gastroesophageal reflux disease.  6. Depression/anxiety.  7. Hypertension.  8. Dyspepsia.  9. Chronic lymphedema.   DISCHARGE MEDICATIONS:  The patient will be resuming her preadmission  medications of Lasix 40 mg daily, Prilosec 20 mg daily, Cymbalta 50 mg  daily, folic acid 1 mg daily, Norvasc 10 mg daily, aspirin 325 mg daily,  MiraLax 17 g daily, Neurontin 100 mg p.o. t.i.d., doxycycline 100 mg  p.o. t.i.d. for 10 days, Atrovent/albuterol nebulizers q.6 h. scheduled.   REASON FOR ADMISSION:  Dyspnea.  The patient was admitted to the  hospital on the early morning of June 18, 2007.  She presented on  June 18, 2007 to the ED for shortness of breath.  X-ray had ruled  out pneumonic infiltrates.  Cardiac panel was negative for evidence of  myocardial infarction.  The patient received nebulizers, but continued  to be wheezing.  Therefore she was admitted for 23-hour observation by  me overnight.  The patient received scheduled nebulizers p.r.n.  She has  improved overall in terms of her shortness of breath this morning.  She  was able to make complete sentences.  This morning she was sitting in  the chair and off oxygen.  Her sat this morning was 97% on 2 L according  to documentation on the [place] system.  Her sugar was 114 mg/dL  this  morning.  Blood pressure 140/74 mmHg, O2 saturation on room air was 88%.  The patient was discharged back to the nursing home in November on  oxygen 2  liters by nasal cannula and therefore we believe that she  benefited from this and will discharge her home on home oxygen.  The  patient's lungs did not show any evidence of significant wheezes this  morning, and she is discharged in stable and satisfactory condition back  to the nursing home.      Jackie Plum, M.D.  Electronically Signed     GO/MEDQ  D:  06/20/2007  T:  06/20/2007  Job:  [id number]

## 2010-11-16 NOTE — H&P (Signed)
[name], [name]            ACCOUNT NO.:  [id number]   MEDICAL RECORD NO.:  [id number]          PATIENT TYPE:  INP   LOCATION:  2306                         FACILITY:  [facility]   PHYSICIAN:  Darryl D. Prime, MD    DATE OF BIRTH:  08/21/1950   DATE OF ADMISSION:  11/13/2008  DATE OF DISCHARGE:                              HISTORY & PHYSICAL   PRIMARY CARE PHYSICIAN:  Bennett Scrape, MD.   CHIEF COMPLAINT:  Shortness of breath.   HISTORY OF PRESENT ILLNESS:  [name] is a 60 year old female with a  history of chronic hypercarbic respiratory failure with a significant  obstructive sleep apnea.  She is on CPAP therapy.  She has a history of  a nursing home status, history of asthma, obesity, COPD.  The patient  has a history hypertension and also a history of bipolar disorder, and  she presents with profound shortness of breath.  The patient apparently  has been having over the last few days cough was yellow productive  sputum with associated chest tightness.  She was found tonight to have  sats of 87% on 3 liters nasal cannula, monitors her usual O2 in the  range of 2-3 liters.  The patient was brought to the ED and placed on  BiPAP.  Baseline CO2 seems to be in the range of 60.   PAST MEDICAL AND SURGICAL HISTORY:  As above.  She also has:  1. History of hypertension.  2. Gastroesophageal reflux disease.  3. Diabetes.  4. History also of dyspepsia.  5. History of abdominal hysterectomy.  6. History of gastric resection.  7. She is status post cholecystectomy.  8. Carpal tunnel decompression.  9. She has a Port-A-Cath in the past.   MEDICATIONS:  1. Advair discus 100/50 twice a day.  2. Neurontin 100 mg three times a day.  3. Naprosyn 500 mg twice a day.  4. Reglan 10 mg t.i.d.  5. Depakote 150 mg at night.  6. Chloraseptic spray as needed.  7. Aspirin 325 mg daily.  8. Lasix 40 mg daily.  9. Risperdal 0.5 mg twice a day.  10.Folic acid one tablet daily.  11.NovoLog insulin.  12.Oxygen 3 liters nasal canula.  13.Benadryl 25 mg as needed.  14.Cymbalta 60 mg daily.  15.Norvasc 10 mg daily.  16.Olopatadine one drop to each eye daily.  17.Protonix 40 mg daily.  18.Systane one drop each eye daily.   ALLERGIES:  NIGHTTIME COLD NYQUIL, PENICILLINS AND TUBERCULIN TB TEST.   SOCIAL HISTORY:  She is single, disabled, denies any drug or alcohol use  currently.  She denies tobacco abuse.  She notes she has never smoked.   FAMILY HISTORY:  Father has prostate cancer.   REVIEW OF SYSTEMS:  A 14-point review of systems negative unless stated  above.   PHYSICAL EXAMINATION:  VITAL SIGNS:  The patient's temperature is 100.60  increased to 102 in the ER, blood pressure 129/76 with a pulse of 109,  respiratory rate of 30, saturations 100% on BiPAP 40% FIO2.  HEENT:  Normocephalic, atraumatic.  Pupils equal, round, react light.  Extraocular being  intact.  The oropharynx showed no apparent lesions.  It was limited by her need for BiPAP continuously at this time.  LUNGS:  Showed decreased breath sounds bilaterally at bases.  This was a  profound finding.  CARDIOVASCULAR:  Exam is regular rhythm with a fast rate.  Distant  sounds.  ABDOMEN:  Obese, soft, nontender, nondistended.  No hepatosplenomegaly.  EXTREMITIES:  No clubbing, cyanosis.  She had some lower extremity  edema.  NEUROLOGIC:  She is alert and oriented x4.  She moves all extremities  well with sensation unchanged.   LABORATORY DATA:  Cardiac markers show CK of 434, MB of 4.5% at 1:25 in  the morning, troponin 0.02.  Lipase was 17 with a gas showing a pH of  7.37, pCO2 of 72, bicarb 95, sodium 139 with a potassium of 3.5,  chloride 93, bicarb 338, BUN 13, creatinine 0.9, glucose 123, albumin  3.4, otherwise normal LFTs.  Lactic acid 1.1.  The patient's EKG showed  sinus tachycardia at a vent rate of 113 beats per minute, increased  heart rate compared to August 2009 EKG.  Chest x-ray  showed significant  bibasilar infiltrates with possible associated effusions bilaterally.  Significant opacities which are new findings compared to previous chest  x-ray dated February 05, 2009.   ASSESSMENT/PLAN:  This is a patient with a history of significant  chronic hypercapnic respiratory failure with elevated bicarb who now  presents with bilateral pneumonia, fever, cough, sputum production,  worsening CO2 retention and hypoxia.  She will be admitted to the ICU at  this time and continued high-dose BiPAP with significant driving  pressure Solu-Medrol will be given vancomycin and moxifloxacin will be  ordered.  Will follow her blood cultures closely.  Sputum cultures will  be ordered.  Oxygen will be given and will follow her closely.  If she  were to decompensated further, we will contact pulmonary critical care  medicine DVT and GI prophylaxis will be ordered.      Darryl D. Prime, MD  Electronically Signed     DDP/MEDQ  D:  11/13/2008  T:  11/13/2008  Job:  [id number]

## 2010-11-16 NOTE — Discharge Summary (Signed)
NAMEYOSHIKO, KELEHER            ACCOUNT NO.:  [id number]   MEDICAL RECORD NO.:  [id number]          PATIENT TYPE:  INP   LOCATION:  4737                         FACILITY:  [facility]   PHYSICIAN:  Hind I Elsaid, MD      DATE OF BIRTH:  April 16, 1951   DATE OF ADMISSION:  05/09/2007  DATE OF DISCHARGE:  05/14/2007                               DISCHARGE SUMMARY   PRIMARY CARE PHYSICIAN:  Jackie Plum, M.D.   DISCHARGE DIAGNOSES:  1. Chronic obstructive pulmonary disease with exacerbation.  2. Atypical chest pain status post cardiac monitor and cardiac enzymes      which were negative.  3. Morbid obesity.  4. Hypertension.  5. Diabetes mellitus.  6. History of constipation.  7. History of leg stasis ulcer.  8. History of depression.  9. Chronic abdominal pain/dyspepsia.  10.History of methicillin-resistant Staphylococcus aureus septicemia      secondary to Port-a-Cath.  11.Bipolar disorder.   MEDICATIONS:  1. Advair Diskus 100/50 one puff twice a day.  2. Neurontin 100 mg p.o. 3 times a day.  3. Reglan 10 mg p.o. 3 times a day.  4. Depakote 750 mg every night at bedtime.  5. Chloraseptic spray every 4 hours as needed for sore throat.  6. Percocet 5/325 mg 1 tablet p.o. q.6h. as needed.  7. Lasix 40 mg p.o. daily.  8. Omeprazole 20 mg p.o. daily.  9. Cymbalta 60 mg p.o. daily.  10.Oxygen 2 liters per minute continuously.  11.Folic acid 1 mg p.o. daily.  12.Risperdal 0.5 mg p.o. b.i.d.  13.Norvasc 10 mg p.o. daily.  14.Aspirin 325 mg p.o. daily.  15.Lovenox 60 mg subcu daily for DVT prophylaxis, as the patient is      bedridden.  16.Insulin NovoLog sliding scale t.i.d. q.a.c. from reading 101-150      with 2 units, from 151-200 with 4 units, 201-250 with 6 units, 251-      300 with 9 units, and 301-350 with 12 units, 351-400 with 15 units,      and more than 400, the patient is to follow up with MD.  17.Prednisone 40 mg p.o. daily for 3 days, then 20 mg p.o. for 3 days,     then 10 mg for 3 days, then 5 mg every other day for 4 days, and      then stop.  18.Avelox 400 mg p.o. daily for 3 days.  19.MiraLax 17 g p.o. every other day for constipation.  20.Senna 1 to 2 tablets p.o. q.h.s. p.r.n. for constipation.  21.Xopenex 0.63 inhaler q.4-6h p.r.n.  22.Vicodin/Lortab 5/500 mg per mouth every 4 to 6 hours as needed for      severe pain.  23.Wheelchair with a high back.   PROCEDURES:  1. Chest x-ray portable, hyperventilation, no acute abnormality.  2. Hip x-ray:  No evidence of left hip acute abnormality.  3. Chest x-ray:  Cardiomegaly, low-volume right basilar atelectasis.  4. CT abdomen and pelvis:  No acute findings and no evidence of      diverticular disease.  5. 2D echo:  Left ventricular size was normal.  Left ventricular  systolic function was normal.  Ejection fraction was 55-65%, still      inadequate evaluated ventricular regional wall motion abnormality,      and left ventricular wall thickness was mildly increased.   HISTORY OF PRESENT ILLNESS:  Please review the history done by Dr.  Michaelyn Barter.  Ms. Sessler is an African-American 60 year old  female  nursing home resident with history of morbid obesity,  hypertension, almost bedridden, diabetes, COPD, admitted to the hospital  with shortness of breath and chest pain.  Symptoms started a week before  admission.  Symptoms associated with generalized body pain.  Also, the  patient complains of cough which is nonproductive.  The patient  admitted.  The chest pain was vague in character, stabbing, goes to her  back.  It occurs every day, and it is constant.  No aggravating factors,  no relieving factors.  At her baseline, she is using a wheelchair to  mobilize herself.  The patient was admitted to the hospital for  evaluation of shortness of breath.  At that time, evaluation of  shortness of breath was multifactorial, most probably secondary to COPD  and morbid obesity and body  habitus.  The patient's chest x-ray was  done, which was completely negative.  The patient was treated  empirically as a case of COPD exacerbation, placed on nasal cannula.  D-  dimer was 0.32, which is insignificant.  BNP was less than 30, which is  not significant.  Cardiac markers were absolutely negative, and EKG was  normal sinus rhythm.  We felt that the patient's shortness of breath is  most probably multifactorial.  The patient was placed on Solu-Medrol and  IV Avelox, with good response in her breathing.  The patient did not  require any invasive method for breathing, and the patient returned to  her baseline during hospitalization.  Chest pain completely resolved  during hospitalization, and as I mentioned, the chest pain looked  atypical in nature.  The 2D echo has regular ejection fraction, and  cardiac enzymes and EKG were negative.  D-dimer was also negative.  No  further chest pain was reported during hospitalization.  During  hospitalization, the patient also complained of abdominal pain and  constipation, for which CT abdomen was done which was completely normal.  The patient was started on a cocktail for constipation, with complete  resolution of her symptoms and has a good regular bowel movement.  During hospitalization, also wound care evaluated the patient for her  wound ulcer which seems chronic in nature.  On their examination, there  is pink scar tissue, dry and intact, without weeping or open wound.  Skin may stay dry and open to air, so that was their recommendation to  keep the skin dry and open to air.  Her previous stasis ulcer are healed  at this time.  We felt that the patient is medically stable to be  discharged to nursing home.  Also, the patient was placed on 24-hour  cardiac monitor, with no active event.  We felt that the patient is  medically stable to be discharged to nursing home.   DISCHARGE PHYSICAL EXAM:  VITAL SIGNS:  Temperature 98.1, pulse  rate 90,  blood pressure 132/79, respiratory rate 20, and saturating 97% on 2  liters.  CBG was 131, and no change in her examination.      Hind Bosie Helper, MD  Electronically Signed     HIE/MEDQ  D:  05/14/2007  T:  05/14/2007  Job:  [id number]

## 2010-11-16 NOTE — Procedures (Signed)
[name], BARTOLINI NO.:  [id number]   MEDICAL RECORD NO.:  [id number]          PATIENT TYPE:  OUT   LOCATION:  SLEEP CENTER                 FACILITY:  [facility]   PHYSICIAN:  Coralyn Helling, MD        DATE OF BIRTH:  02/10/1951   DATE OF STUDY:  09/19/2007                            NOCTURNAL POLYSOMNOGRAM   REFERRING PHYSICIAN:  Coralyn Helling, MD   INDICATION FOR STUDY:  This is an individual who has a history of  obstructive sleep apnea.  She is on CPAP therapy.  She has had  persistent symptoms of sleep disruption and daytime fatigue.  In  addition, she does have a history of hypertension and diabetes.  She is  referred to the sleep lab for a BiPAP titration study.   Height is 5 feet.  Weight is 308 pounds.  BMI is 60.  Neck size is 19.5.   EPWORTH SLEEPINESS SCORE:  2.   MEDICATIONS:  Xanax, Naprosyn, Protonix, folic acid, furosemide,  Benadryl, Neurontin, aspirin, Percocet, Norvasc, NovoLog insulin,  Reglan, Depakote, Mylanta, Advair, vitamin C, Folvite and Cymbalta.   SLEEP ARCHITECTURE:  Sleep period time is 340 minutes.  Total sleep time  is 322 minutes.  Sleep efficiency is 86%.  Sleep latency is 35 minutes,  which is prolonged.  REM latency is 303 minutes, which is prolonged.  The patient spent 2% of sleep period in stage 1 sleep, 81% of sleep  period in stage 2 sleep and 11% of sleep period in REM sleep.  The study  was notable for lack of slow wave sleep.  The patient slept exclusively  in the supine position.   RESPIRATORY DATA:  The average respiratory rate was 21.  The patient was  titrated from a BiPAP pressure setting of 8/5 to 25/18.  It appeared  that at a BiPAP pressure setting of 22/18 her snoring was significantly  reduced and her apnea/hypopnea index was reduced to 1.9.  She was  observed in both REM sleep and supine sleep at this pressure setting.   OXYGEN DATA:  The study was done with the addition of 2L of supplemental  oxygen which the  patient wears continuously.  The baseline oxygenation  was 94%.  The oxygen saturation nadir was 83%.  At a BiPAP pressure  setting of 22/18 the oxygen saturation nadir was 89%.   CARDIAC DATA:  The average heart rate was 71.  The rhythm strip showed  normal sinus rhythm.   MOVEMENT-PARASOMNIA:  The periodic limb movement index was 0.7.   IMPRESSIONS-RECOMMENDATIONS:  This was a BiPAP titration study.  At a  BiPAP pressure setting of 22/18, the apnea/hypopnea index was reduced to  2.  At this pressure setting the patient was observed in REM sleep and  supine sleep and snoring was significantly reduced.  Of note, the  patient was fitted with a [employer]Med Mirage Quattro full face mask.  In  addition, this study was done with the addition of 2L of supplemental  oxygen.      Coralyn Helling, MD  Diplomat, American Board of Sleep Medicine  Electronically Signed     VS/MEDQ  D:  09/27/2007 08:18:05  T:  09/27/2007 10:06:41  Job:  [id number]

## 2010-11-16 NOTE — Discharge Summary (Signed)
[name], [name]            ACCOUNT NO.:  [id number]   MEDICAL RECORD NO.:  [id number]          PATIENT TYPE:  INP   LOCATION:  1414                         FACILITY:  [facility]   PHYSICIAN:  Jackie Plum, M.D.DATE OF BIRTH:  1950/10/27   DATE OF ADMISSION:  07/03/2007  DATE OF DISCHARGE:  07/13/2007                               DISCHARGE SUMMARY   DISCHARGE DIAGNOSES:  1. Chronic respiratory failure.  2. Chronic obstructive pulmonary disease.  3. Obstructive sleep apnea.  4. Morbid obesity.  5. Bipolar disorder.  6. Diabetes mellitus.  7. Gastroesophageal reflux disease.  8. Hypertension.  9. Depression.  10.Chronic lymphedema.   DISCHARGE MEDICATIONS:  1. Advair 1 puff b.i.d.  2. Reglan 10 mg t.i.d.  3. Depakote 750 mg p.o. nightly.  4. Multivitamin 1 tablet p.o. daily.  5. Ascorbic acid 500 mg p.o. b.i.d.  6. Zinc sulfate 220 mg p.o. daily.  7. Lasix 40 mg daily.  8. Cymbalta  60 mg p.o. daily.  9. Folic acid 1 mg p.o. daily.  10.Norvasc 10 mg p.o. daily.  11.Aspirin 325 mg p.o. daily.  12.Neurontin 100 mg p.o. t.i.d.  13.Maalox 20 mL t.i.d. p.r.n.  14.Sliding scale insulin.  15.Protonix 40 mg p.o. b.i.d.  16.Lovenox 70 mg subcutaneous injection.  17.Bactrim DS p.o. b.i.d.  for 7 days.  18.Tramadol 50 mg q.4 h., p.r.n..  19.Benadryl 25 mg p.o. q.4 h., p.r.n.   DISCHARGE LABS:  WBC count 12.9.  Hemoglobin 11.6.  Hematocrit 34.6, MCV  91.4, platelet count 261.  Sodium 143, potassium 3.9, chloride 97, CO2  38, glucose 128, BUN 18, creatinine 0.92, potassium 9.2.   DISPOSITION:  The patient is discharged to [facility], where she  came from.   CONDITION ON DISCHARGE:  Improved,  satisfactory.   REASON FOR ADMISSION:  Acute on chronic respiratory failure.   The patient is a 60 year old African American lady with morbid obesity.  She came into the ED with nausea and vomiting, and was being evaluated  for gastroenteritis.  She then started being short  of breath while in  the ED, and ABG indicated hypercarbia.  She was therefore admitted for  further admission.   On admission the patient's x-ray of the chest was done, which ruled out  evidence of pneumonia.  She had UA done because of her fever in the  hospital, which was positive for Proteus mirabilis, for which she was  given antibiotics with Bactrim sensitivity.  She had BiPAP treatment.  She required antibiotics with pulmonary toilet and steroids, which were  discontinued with improvement.  The patient was seen by pulmonary  service and after their evaluation they felt the patient would need  intermittent BiPAP at night and ultimately she may end up needing  tracheostomy, and her respiratory status is going to get worse unless  she is able to lose some weight.  This was discussed with the patient  and her family on several occasions.  The patient's sats have been in  the upper 90s on oxygen by nasal cannula during the day and late  evening, and she has been tolerating BiPAP and she  is planned for  discharge on BiPAP settings of 12, 6, INE, which she has been receiving  while she was in the hospital.  The patient is to be set up to see  [facility], Dr. Craige Cotta and possibly work on getting a sleep study  for her.      Jackie Plum, M.D.  Electronically Signed     GO/MEDQ  D:  07/13/2007  T:  07/13/2007  Job:  [id number]

## 2010-11-16 NOTE — H&P (Signed)
[name], [name] NO.:  [id number]   MEDICAL RECORD NO.:  [id number]          PATIENT TYPE:  INP   LOCATION:  1231                         FACILITY:  [facility]   PHYSICIAN:  Jackie Plum, M.D.DATE OF BIRTH:  [birth year]   DATE OF ADMISSION:  07/03/2007  DATE OF DISCHARGE:                              HISTORY & PHYSICAL   ADMITTING DIAGNOSES:  1. Acute on chronic hypoxic/hypercarbic respiratory failure with      acidosis.  2. Gastroenteritis.  3. Chronic obstructive pulmonary disease.  4. Morbid obesity.  5. Bipolar disorder.  6. Diabetes mellitus.  7. Gastroesophageal reflux disease.  8. Hypertension.  9. Depression.  10.Chronic lymphedema.  11.Left lower extremity swelling, rule out deep vein thrombosis.   CHIEF COMPLAINT:  Nausea and vomiting with diarrhea.   HISTORY OF PRESENT ILLNESS:  The patient is a 60 year old African  American lady who lives at [facility]. She had been  complaining of nausea and vomiting, however, on talking to the patient's  nursing staff, they  indicated that she had had an episode or two of  diarrhea and had been doing fine. The patient had called earlier  yesterday and wanted to come be admitted to the hospital. I spoke to her  by phone regarding this.  The daughter was asking if we could also get a  pulmonary medicine to see her at some point. However, the patient was  brought to the ED early this morning on account of nausea and  vomiting.In the ED, the emergency room physician states she was doing  fairly well, however, she became obtunded while in the ER and her gases  showed hypercarbia. She therefore was admitted for further management of  respiratory failure. She denies abdominal pain. No chest pain. She  denied any chills but had a fever up to 102.0 degrees Fahrenheit in the  emergency room. She denies any heat or cold intolerance. She has had  some cough, denies sputum production.   PAST  MEDICAL HISTORY:  Notable for:  1  History of COPD with several admissions to the hospital for this.  1. Diabetes.  2. Morbid obesity.  3. Bipolar disorder.  4. GERD.  5. Depression with anxiety.  6. Hypertension.  7. Dyspepsia.  8. Chronic lymphedema.   MEDICATIONS:  1. Lasix 40 mg daily.  2. Prilosec 20 mg daily.  3. Cymbalta 60 mg daily.  4. Folic acid 1 mg daily.  5. Norvasc 10 mg daily.  6. Aspirin 325 mg daily.  7. MiraLax 17 gram daily,  8,  Neurontin 100 mg t.i.d.  1. Atrovent aneurysm albuterol q6h.  2. She is on Risperdal 0.5 mg b.i.d.  3. Advair Diskus 100/50 mg one puff b.i.d.  4. Vicodin  5/500, 1 tab q4h p.r.n.  5. Vitamin C 500 mg daily.  6. Multivitamin 1 capsule daily/  7. Depakote Extended Release 250 mg at night.   PAST SURGICAL HISTORY:  1. Status post cholecystectomy.  2. Appendectomy.  3. Hysterectomy.  4. __________  surgery.   ALLERGIES:  She is allergic to PENICILLIN and NYQUIL.   SOCIAL HISTORY:  She is a resident of [facility] home.   FAMILY HISTORY:  Positive for diabetes mellitus.   REVIEW OF SYSTEMS:  Significant for history as listed above.   PHYSICAL EXAMINATION:  VITAL SIGNS: Blood pressure 108/56, temperature  101.8 degrees Fahrenheit rectally, heart rate 110, respirations 22. The  patient was receiving oxygen by BiPAP treatment. She was in moderate  respiratory distress.  HEENT: Oropharynx moist. __________  NECK: Supple, no JVD.  LUNGS: Diminished breath sounds.  CARDIAC:  Regular, tachycardic.  No gallops.  ABDOMEN: Obese,  soft, bowel sounds present.  EXTREMITIES: No cyanosis, bipedal edema with brawny edema, left lower  extremity more so than the right.  CENTRAL NERVOUS SYSTEM:  The patient is alert and responsive, moves all  extremities.   LABORATORY DATA:  White count 9.1, hemoglobin 11.8, hematocrit 34.7, MCV  12.1, platelet count 177, sodium 140, potassium 3.6, chloride 96, CO2  34, glucose 123, BUN 21,  creatinine 0.94, calcium 8.7, total protein 7,  albumin 3.6, AST 35, ALT 23.  ABG's: pH 7.253, pCO2 83, pO2 29,  bicarbonate 85.4, O2 98% on BiPAP.   ASSESSMENT AND PLAN:  The patient has acute respiratory failure, acute  on chronic, with hypoxic/hypercarbic respiratory failure due to  combination of COPD, obesity/hypoventilation syndrome and morbid  obesity. She is admitted for IV antibiotics. X-ray was negative for  acute infiltrates. However she has an elevated D-dimer, but there is no  evidence of PE clinically and therefore we will not pursue any PE workup  unless deemed so appropriate by pulmonologist who has been consulted.  The patient will be given antibiotics and non-invasive breathing  support.  Though she has a history of gastroenteritis but this did not seem to be  very significant at this time but she will be monitored.  Order C. diff  toxin as she  has been on antibiotics and give some IV fluids and  provide supportive care. Will monitor her since she is diabetic.      Jackie Plum, M.D.  Electronically Signed     GO/MEDQ  D:  07/04/2007  T:  07/04/2007  Job:  [id number]

## 2010-11-16 NOTE — H&P (Signed)
NAMEMARKELL, [name] NO.:  [id number]   MEDICAL RECORD NO.:  [id number]          PATIENT TYPE:  INP   LOCATION:  4733                         FACILITY:  [facility]   PHYSICIAN:  Michaelyn Barter, M.D. DATE OF BIRTH:  09-27-1950   DATE OF ADMISSION:  05/10/2007  DATE OF DISCHARGE:                              HISTORY & PHYSICAL   PRIMARY CARE PHYSICIAN:  Unassigned.   CHIEF COMPLAINT:  Shortness of breath and chest pain.   HISTORY OF PRESENT ILLNESS:  [name] is a 60 year old female with a  past medical history of morbid obesity, hypertension, diabetes mellitus,  COPD who has numerous medical complaints.  She indicates that she has  had chest as well as back pain and some shortness of breath.  Initially,  she stated her symptoms started approximately a week ago, but later  recanted and stated they started at least two months ago.  She also  complains of an off and on headache.  She states that she currently is  oxygen dependent and indicates that it is becoming more difficult to  complete her ADLs secondary to progressive shortness of breath.  She  complains of a cough which is nonproductive.  She indicates that she  feels as if everything that she attempts to eat tries to come back up.  However, she has not actually vomited.  She also complains of some cold  sensations followed by hot sensations.  The chest pain, again, started  two months ago.  The pain is described as right sided.  It is vaguely  characterized as a stabbing sensation.  She states that it occurs every  day.  It is constant.  There are no aggravating factors.  No relieving  factors.  She indicates that at baseline she uses a wheelchair to  mobilize herself.   PAST MEDICAL HISTORY:  1. Severe bilateral lower extremity pressure ulcers.  2. Recurrent cellulitis.  3. Bipolar disorder.  4. Morbid obesity.  5. Chronic obstructive pulmonary disease.  6. MRSA septicemia secondary to a Portacath  which had been initially      placed secondary to group G streptococcus bacteremia.  7. Diabetes mellitus.  8. Depression.  9. Anxiety disorder.  10.GERD.  11.Hypertension.  12.Normocytic anemia.  13.Chronic abdominal pain/dyspepsia.   PAST SURGICAL HISTORY:  1. Cholecystectomy.  2. Ventral hernia repair.  3. Appendectomy.  4. Multiple skin grafts to her left lower extremity.  5. Hysterectomy.  6. Carpal tunnel syndrome surgery.   ALLERGIES:  PENICILLIN, NYQUIL.   CURRENT MEDICATIONS:  1. Sliding scale insulin.  2. Vicodin/Lortab.  3. Hydrocodone/APAP 5/500 mg one p.o. q.4 h p.r.n.  4. OxyIR 5 mg one tablet p.o. q.4 h p.r.n.  5. Advair Discus one puff b.i.d.  6. Neurontin 100 mg p.o. t.i.d.  7. Tandem Plus Capsule one p.o. t.i.d.  8. Tylenol 500 mg two tablets q.8 h p.r.n.  9. Reglan 10 mg one p.o. t.i.d.  10.Depakote 750 mg p.o. q.h.s.  11.Percocet 5/325 mg one q.6 h p.r.n.  12.Aspirin 81 mg daily.  13.Norvasc 10 mg p.o. daily.  14.Lasix 40 mg daily.  15.Centrum  multivitamin one tablet daily.  16.Omeprazole 20 mg p.o. daily.  17.Claritin 5 mg p.o. daily.  18.Cymbalta 60 mg p.o. daily.  19.Risperdal 0.5 mg p.o. b.i.d.  20.Folic acid 1 mg daily.   SOCIAL HISTORY:  The patient currently is a resident of [facility].   FAMILY HISTORY:  The patient does not provide.   REVIEW OF SYSTEMS:  Positive for chest pain.  Positive for shortness of  breath.  Positive for occasional headache.   PHYSICAL EXAMINATION:  GENERAL:  The patient is awake.  She is morbidly  obese.  She speaks in full sentences.  No obvious respiratory distress  currently.  VITAL SIGNS:  Temperature 99.5, blood pressure 176/47, heart rate 93,  respirations 20, O2 saturation 100% on oxygen.  HEENT:  Normocephalic, atraumatic.  Anicteric.  Extraocular movements  intact.  Oral mucosa is pink.  No thrush.  No exudates.  NECK:  No JVD.  No lymphadenopathy.  Thyroid is palpable.  CARDIAC:  S1, S2  present.  Regular rate and rhythm.  No murmurs, gallops  or rubs.  ABDOMEN:  Soft, nontender, nondistended.  Positive bowel sounds.  No  masses.  EXTREMITIES:  Left leg is wrapped in gauze with an ACE bandage around  it.  Right leg, no edema.  NEUROLOGICAL:  The patient is alert and oriented x3.  MUSCULOSKELETAL:  Upper and lower extremities 5/5 strength.   STUDIES:  Chest x-ray reveals hypoventilation, no acute abnormalities.  BNP is less than 30, valproic acid is 26.7.  Sodium 138, potassium 3.9,  chloride 91, CO2 40, glucose 104, BUN 12, creatinine 0.92, bilirubin  total 0.3, alk-phos 66, SGOT 26, SGPT 17, total protein 6.4, albumin  3.5, calcium 9.1.  D. dimer is 0.41.  CK MB POC 2.9, troponin I less  than 0.05.  WBC 7.4, hemoglobin 11.2, hematocrit 33.2, platelets 222.   ASSESSMENT/PLAN:  [name] is a 60 year old female who suffers from  morbid obesity who currently presents with multiple physical complaints  including chest pain and shortness of breath amongst others.  1. Shortness of breath.  I suspect this may be multifactorial      including a probable COPD exacerbation as well as the patient's      morbid obesity may be contributing.  Will start the patient on      steroids, nebulize breathing treatments and will check an ABG.  2. Chest pain.  This sounds very atypical.  The patient's D. dimer is      negative making pulmonary emboli less likely.  Will check cardiac      markers x3 q.8 h apart.  3. Diabetes mellitus.  Will resume the patient's prior medications for      diabetes and will check hemoglobin A1C.  4. Hypertension.  The patient's blood pressure currently is elevated.      Will resume her prior home medications and titrate the dose as      necessary.  5. History of gastroesophageal reflux disease.  By description, this      may be contributing to the patient's chest pain.  Will provide      Protonix for now.  6. History of depression.  Will monitor this.   7. Left leg wound.  Will consider consultation with the wound care      nurse.  8. Gastrointestinal prophylaxis.  Will provide Protonix.  9. Deep vein thrombosis prophylaxis.  Provide Lovenox.      Michaelyn Barter, M.D.  Electronically Signed  OR/MEDQ  D:  05/10/2007  T:  05/10/2007  Job:  [id number]

## 2010-11-19 NOTE — Discharge Summary (Signed)
NAME:  [name], [name]                      ACCOUNT NO.:  [id number]   MEDICAL RECORD NO.:  [id number]                   PATIENT TYPE:  INP   LOCATION:  5735                                 FACILITY:  [facility]   PHYSICIAN:  Deirdre Peer. Polite, M.D.              DATE OF BIRTH:  [birth year]   DATE OF ADMISSION:  12/11/2003  DATE OF DISCHARGE:                                 DISCHARGE SUMMARY   DISCHARGE DIAGNOSES:  1. Right lower extremity cellulitis, improved.  Recommend continue     antibiotics orally x1 week.  Please note that ultrasound has been     negative for deep vein thrombosis x2.  2. History of methicillin-sensitive Staphylococcus aureus bacteremia     secondary to Port-A-Cath, which has been removed.  3. 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  4. Probable chronic obstructive pulmonary disease.  5. Diabetes.  6. Hypertension.  7. Depression.  8. Morbid obesity.  9. Gastroesophageal reflux disease.  10.      History of group B Streptococcus bacteremia in February 2005.   DISCHARGE MEDICATIONS:  1. Zoloft 100 mg daily.  2. Protonix 40 mg daily.  3. Aspirin 81 mg daily.  4. Altace 2.5 mg daily.  5. Norvasc 10 mg daily.  6. Sliding scale insulin, regular, for insulin moderate-sensitive.  7. Advair 100/50 one inhalation b.i.d.  8. Cleocin 450 mg q.i.d. x7 days.  9. Xanax 0.5 mg t.i.d. p.r.n.  10.      Albuterol nebulizer q.6h. p.r.n. shortness of breath.  11.      Percocet one or two tablets q.4-6h. p.r.n. pain.   DISPOSITION:  Discharged to nursing home facility.   Chest x-ray:  Right lower lobe atelectasis.  Lower extremity Doppler  negative for DVT.  CBC on admission:  White count 16.5, hemoglobin 10.6,  platelets 251.  CBC on the 11th:  White count 8.9, hemoglobin 9.6, platelets  238.  BMET on admission within normal limits.  Last BMET on 11th within  normal limits.  UA negative.  Blood cultures:  No growth.   HISTORY OF PRESENT ILLNESS:  A  60 year old female with above medical  problems, resides at a nursing home facility, receiving IV antibiotics for  MSSA.  Transferred to the ED for worsening lower extremity edema on the  right, documented warmth and leukocytosis on CBC.  In the ED the patient was  evaluated.  Exam was consistent with cellulitis, and admission was deemed  necessary for further evaluation and treatment.  Please see dictated H&P for  further details.   PAST MEDICAL HISTORY:  As stated above.   MEDICATIONS ON ADMISSION:  Significant for Prilosec, Ventolin, Zoloft,  aspirin, Altace, Norvasc, Xanax, Ancef, Percocet.   SOCIAL HISTORY:  The patient denies tobacco, ex-alcohol use for many years,  no drugs.   PAST SURGICAL HISTORY:  Significant for cholecystectomy, total abdominal  hysterectomy, multiple skin grafts to lower extremities.   ALLERGIES:  Chart shows questionable PENICILLIN allergy; however, the  patient has tolerated Ancef without difficulty.   FAMILY HISTORY:  Noncontributory.   HOSPITAL COURSE:  The patient was admitted to a medicine floor bed for  evaluation and treatment of recurrent cellulitis.  The patient was started  on IV Clindamycin.  The patient had an ultrasound of her lower extremities  because of some associated swelling and complaint of pain.  Ultrasound was  negative for DVT x2.  The patient's hospital course was one of continued  improvement without any major complications.  The patient's antibiotics were  changed to p.o. with continued tolerance.  At this time the patient is  deemed stable for discharge to home.  At times the patient did have some  mild coughing of clear sputum during this hospitalization without fever or  elevation in white blood cell count.  The patient had a chest x-ray which  showed right middle lobe atelectasis, recommend the patient continue with  her nebulizers and also recommend the patient use incentive spirometry.  At  this time the patient will  resume medications as outlined above and is  stable for discharge.                                                Deirdre Peer. Polite, M.D.    RDP/MEDQ  D:  12/17/2003  T:  12/17/2003  Job:  [id number]

## 2010-11-19 NOTE — Consult Note (Signed)
NAME:  [name], [name]                      ACCOUNT NO.:  [id number]   MEDICAL RECORD NO.:  [id number]                   PATIENT TYPE:  INP   LOCATION:  A304                                 FACILITY:  [facility]   PHYSICIAN:  Jorja Loa, M.D.             DATE OF BIRTH:  [birth year]   DATE OF CONSULTATION:  09/22/2003  DATE OF DISCHARGE:                                   CONSULTATION   REASON FOR CONSULTATION:  Renal insufficiency.   HISTORY OF PRESENT ILLNESS:  She is a 60 year old African American female  with history of type II diabetes, morbid obesity, history of cellulitis,  GERD, presently admitted because of chest pain.  On blood work, the patient  was found to have elevated BUN and creatinine.  Ms. Wigington was admitted  presently where she had placement of PermCath for antibiotics for her  chronic cellulitis of her leg.  The patient presently denies any previous  history of renal insufficiency and kidney stone or diabetic nephropathy.   PAST MEDICAL HISTORY:  1. Diabetes.  2. Morbid obesity.  3. Depression.  4. Gastroesophageal reflux disease.  5. Chronic pain syndrome.  6. History of depression.  7. History of chronic anemia.  8. History of cellulitis, type II.   SOCIAL HISTORY:  She has history of smoking 1-2 packs a day and lives at  home.   ALLERGIES:  PENICILLIN.   MEDICATIONS:  1. Aspirin 81 mg p.o. daily.  2. Duragesic patch 50 mcg q.72h.  3. Advair one puff b.i.d.  4. She was on gentamycin IV which was held.  5. Protonix 40 mg IV.  6. She is getting IV fluid at 100 cc per hour.  7. Vancomycin which was withheld.  8. Oxycodone for pain.   REVIEW OF SYSTEMS:  No new complaints.  Occasional shortness of breath.  The  patient denies any chest pain.  At this moment, she says that she is feeling  better, but does also feel that she orthopnea.  Heart rate is reasonable.  She also has history of leg pain, especially on the left side.   PHYSICAL  EXAMINATION:  GENERAL APPEARANCE:  The patient is alert, sitting in  no apparent distress.  VITAL SIGNS:  Blood pressure 151/86, temperature 98.4, pulse 86.  HEENT:  No conjunctival pallor.  Non icterus.  Oral mucosa seems to be dry.  CHEST:  Decreased breath sounds.  No wheezing, no rales.  No egophony.  HEART:  Distant S1, S2.  No murmur.  ABDOMEN:  Obese.  Very difficult to palpate organomegaly.  EXTREMITIES:  Left leg no edema.  Right leg significant scar and also skin  deformity because of her cellulitis.   LABORATORY DATA:  Blood work:  White blood cell count is 7.1, hemoglobin  9.8, hematocrit 28.8.  Sodium 137, potassium 4.3, BUN 19, creatinine 2.7.  When the patient was admitted, BUN was 8 and creatinine 0.7.  On March 12,  today, the patient came and her BUN and creatinine were 17 and 1.6.  Since  then, it seems to be progressively increasing.  She has UA which was done  while she was in the hospital yesterday.  Specific gravity was 1.01, pH 5,  no protein.   ASSESSMENT:  1. Renal insufficiency, at this moment, seems to be acute, could be     multifactorial including ATN possibly related to gentamycin and     vancomycin, however, since the patient has been taking Motrin, Advil     prior to her admission for a long period.  Other etiology, such as     ___________, renal insufficiency cannot be ruled out.  Since the patient     also has history of diabetes for a long time and cellulitis, diabetic     nephropathy and __________ related renal insufficiency should be also put     as a differential diagnosis, but this problem is usually manifested with     proteinuria and renal failure.  At this moment, the patient does not have     any proteinuria.  2. Diabetes, long-standing.  3. Obesity. Very significant with probably sleep apnea.  4. Chronic anemia, etiology not clear.  Her renal function was not normal     during her previous admission, even though she still has anemia.   Hence,     probably not related to her renal insufficiency.  Etiology at this moment     not clear.  5. History of GERD.  6. No cellulitis, long-standing, probably associated with status dermatitis.   RECOMMENDATIONS:  I agree with holding gentamycin and vancomycin.  At this  moment, we may need to continue with her IV fluid.  If this is from  gentamycin, this may take a long time for the patient to recover.  We will  check ultrasound of the kidney and follow her blood work.      ___________________________________________                                            Jorja Loa, M.D.   BB/MEDQ  D:  09/22/2003  T:  09/23/2003  Job:  [id number]

## 2010-11-19 NOTE — H&P (Signed)
NAME:  [name], [name]                      ACCOUNT NO.:  [id number]   MEDICAL RECORD NO.:  [id number]                   PATIENT TYPE:  INP   LOCATION:  A310                                 FACILITY:  [facility]   PHYSICIAN:  Dirk Dress. Katrinka Blazing, M.D.                DATE OF BIRTH:  [birth year]   DATE OF ADMISSION:  02/27/2003  DATE OF DISCHARGE:                                HISTORY & PHYSICAL   A 60 year old female with a history of severe stasis ulcer changes due to  venous hypertension.  She has had chronic ulcerations of her left leg and  foot.  She has had previous surgery includes wide excision and skin  grafting.  She has been followed primarily by Dr. Tanda Rockers.  Her ulcers were  fairly well healed until February 19, 2003.  She presented to the office, on  that day, with a history of open ulcer on the left lower leg for the past  three days.  She was treated locally and advised to followup with Dr.  Tanda Rockers on the day prior to admission, but she missed that appointment.  She  returned on the day of admission, which was February 27, 2003.  She had open,  draining, ulcerations of the medial and lateral aspects of her foot with  cellulitis and increased purulent drainage.  She is admitted for treatment.   PAST HISTORY:  1. She has a history of uncontrolled diabetes mellitus.  2. Hypertension.  3. Gastroesophageal reflux disease.  4. Osteoarthritis.  5. Bronchial asthma.  6. Chronic venous stasis disease with venous hypertension.   SURGERY:  1. Abdominal hysterectomy.  2. Gastric resection.  3. Cholecystectomy.  4. Carpal tunnel decompression.  5. Port-A-Cath insertion.   PREMEDICATION:  1. Albuterol MDI, two puffs q.i.d.  2. Aspirin 81 mg every day.  3. __________ Plus one every day.  4. Darvocet one q.4h. p.r.n.  5. Zoloft 100 mg every day.  6. Metformin 500 mg every day.  7. Percocet 5/325 q.6h. p.r.n.  [8.  Aciphex 20 mg every day].  1. Prilosec 40 mg b.i.d.  2. Fioricet  one t.i.d. p.r.n. headache.  3. Imipramine 50 mg every day h.s.  4. Advair 100/50 b.i.d.  5. Hydrocodone 5/500 b.i.d. p.r.n.   ALLERGIES:  1. PENICILLIN.  2. ASPIRIN.  3. NYQUIL.   SOCIAL HISTORY:  The patient is single.  She is disabled.  She has a 9th  grade education.  She denies drug and alcohol abuse.   PHYSICAL EXAMINATION:  GENERAL:  She is an obese female who appears to be in  no acute distress.  VITAL SIGNS:  Blood pressure 150/84, pulse 98, respirations 24, weight 285  pounds, height 5 feet.  HEENT:  Unremarkable.  She has multiple missing teeth.  NECK:  Supple.  CHEST:  Clear to auscultation.  No rales, rubs, rhonchi or wheezes.  BREASTS:  Major macromastia without dominant masses.  No  skin or nipple  changes.  ABDOMEN:  Obese, soft, nontender.  Normoactive bowel sounds.  EXTREMITIES:  Chronic venous stasis disease bilaterally, much worse on the  left than on the right.  Chronic ulceration with purulent drainage and  cellulitis of the dorsum of the left lateral foot as well as the lower left  leg above the medial malleolus.  I can not feel pulses on the left.  She has  weak dorsalis pedis on the right.  There are chronic fungal changes to the  nails on both feet.  NEUROLOGIC:  No focal motor or sensory or cerebellar deficit.   IMPRESSION:  1. Chronic venous stasis disease with recent exacerbation with stasis     ulceration and secondary infection.  2. Recurrent abdominal pain, etiology undetermined.  3. Chronic gastritis by history.  4. Hypertension.  5. Diabetes mellitus.  6. Gastroesophageal reflux disease.  7. Chronic obstructive lung disease.  8. Depression with anxiety.  9. Chronic anemia.   PLAN:  The patient will be admitted.  She will be started on bed rest.  She  will receive IV antibiotics, pending culture.  We will do local wound care  and get physical therapy involved.  We will probably do some compression  dressings while she is hospitalized.   After the wound is healed, we will  discharge her home under the care of [facility].                                               Dirk Dress. Katrinka Blazing, M.D.    LCS/MEDQ  D:  02/28/2003  T:  02/28/2003  Job:  [id number]

## 2010-11-19 NOTE — Procedures (Signed)
[place]  Patient:    [name], [name]                   MRN: [id number] Proc. Date: 12/20/99 Adm. Date:  [id number] Disc. Date: [id number] Attending:  Nelda Marseille CC:         Petra Kuba, M.D.             Montey Hora, M.D., [facility]                           Procedure Report  PROCEDURE:  Esophagogastroduodenoscopy.  INDICATIONS:  Guaiac positivity, multiple GI complaints.  Consent was signed after risks, benefits, methods and options were thoroughly discussed in the office in the past before any medicines were given.  MEDICATIONS:  Additional medications for this procedure: 10 Demerol and 4 of Versed.  PROCEDURE:  Video endoscope was inserted by direct vision.  The esophagus was normal.  In the distal esophagus, a small hiatal hernia was seen.   The scope was inserted into the stomach and advanced to the antrum, pertinent for some minimal linear gastritis or antritis with a normal pylorus into a normal duodenal bulb and around the C-loop to a normal second portion of the duodenum.  Scope was withdrawn back to the bulb and a good look there ruled out ulcers in all locations.  Scope was withdrawn back to stomach and retroflexed.  High in the cardia the hiatal hernia was confirmed.  Stomach was evaluated on retroflexed and then straight visualization with a good look in the cardia, fundus, angularis, lesser and greater curve and no obvious abnormalities were seen, other than the linear gastritis, antritis mentioned above.  The scope was then slowly withdrawn through the normal esophagus, except for the small hiatal hernia.  Scope was removed, patient tolerated the procedure well, there was no obvious immediate complication.  ENDOSCOPIC DIAGNOSES: 1.  Small hiatal hernia. 2.  Minimal linear antritis. 3.  Otherwise normal EGD.  PLAN:  Continue Prevacid, follow up p.r.n. or in six to eight weeks to recheck guaiacs, symptoms and  decided any other work-up and plans, like possibly one time small-bowel follow-through or even cat scan and please colon dictation for further recommendations, plans and findings. DD:  12/20/99 TD:  12/22/99 Job: [id number]

## 2010-11-19 NOTE — H&P (Signed)
NAME:  [name], FUKUDA                      ACCOUNT NO.:  [id number]   MEDICAL RECORD NO.:  [id number]                   PATIENT TYPE:  INP   LOCATION:  5735                                 FACILITY:  [facility]   PHYSICIAN:  Deirdre Peer. Polite, M.D.              DATE OF BIRTH:  Nov 30, 1950   DATE OF ADMISSION:  12/11/2003  DATE OF DISCHARGE:                                HISTORY & PHYSICAL   CHIEF COMPLAINT:  Leg pain, redness X2 days on right side.   HISTORY OF PRESENT ILLNESS:  The patient is a 60 year old black female with  multiple medical problems who has been on our medical service in the past in  May 2005 who is sent to the emergency department today for evaluation of  pain, redness and swelling in the right lower extremity.  The patient has a  history of chronic venous stasis dermatitis/cellulitis and has had multiple  skin grafts on the left leg.  The patient states she was in her usual state  of health until about 3 days ago when she started experiencing the above  symptoms.  Because of that the patient was sent to the emergency department  for further evaluation.  In the ED the patient was found to be febrile, have  an elevated white blood cell count, had an ultrasound that was negative for  deep venous thrombosis.  Because of patient's persistent symptoms and  history of chronic infections, admission is deemed necessary for further  evaluation.  Of note the patient has been on intravenous antibiotics,  intravenous Ancef, 13 out of 14 days for treatment of methicillin sensitive  Staphylococcus aureus.  She denies any complications with her PICC site.  She denies any coughing of any phlegm.  Denies any dysuria.   PAST MEDICAL HISTORY:  Significant for methicillin sensitive Staphylococcus  aureus bacteremia secondary to Port-A-Cath which has been removed.  The  patient is on day 13 of 14 of intravenous Ancef 2 grams q.8h.  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  Probable  chronic obstructive pulmonary disease/asthma.  Please note the patient  states she has never had a significant smoking history in the past.  Probable obstructive sleep apnea.  ABG's on prior admission showed  hypercarbic respiratory failure.  Please note the patient refused BiPAP  during that admission.  Diabetes.  Hypertension.  Depression.  Morbid  obesity.  Gastroesophageal reflux disease.  History of group B Strep  bacteremia February 2005.   MEDICATIONS:  1. Prilosec 20 mg daily.  2. Ventolin 2.5 mg nebs t.i.d.  3. Zoloft 100 mg daily.  4. Aspirin 81 mg daily.  5. Altace 2.5 mg daily.  6. Norvasc 10 mg daily.  7. Xanax 0.5 mg q.6h. PRN.  8. Ancef 1 gram intravenous q.8h X two weeks.  9. Percocet PRN.   SOCIAL HISTORY:  The patient denies tobacco, ex-alcohol use, none for many  years.  No drugs.   PAST SURGICAL HISTORY:  Significant for cholecystectomy, total abdominal  hysterectomy, multiple skin grafts to lower extremities.   ALLERGIES:  Chart shows questionable allergy to PENICILLIN, however, please  note patient is on Ancef intravenously q.8h.   FAMILY HISTORY:  Noncontributory.   REVIEW OF SYMPTOMS:  As stated in the history of present illness.   PHYSICAL EXAMINATION:  GENERAL:  Patient is alert and oriented X3 in no  apparent distress.  She is easily aroused.  VITAL SIGNS:  Temperature 100.4.  Blood pressure 130/72, pulse 91,  respiratory rate 20.  HEENT:  Muddy sclerae.  No oral lesions.  Patient has dentures.  CHEST:  Clear to auscultation bilaterally.  CARDIOVASCULAR:  Regular, S1, S2, no S3.  ABDOMEN:  Obese with no hepatosplenomegaly appreciated.  EXTREMITIES:  Patient has chronic venous stasis changes on the left with old  scarring from prior skin grafts.  Patient's right lower extremity is  somewhat swollen, 1+ edema, warm to touch with obvious erythema to mid calf.  There is no open wound.  NEUROLOGICAL:  Grossly intact.   RECTAL:  Deferred.   CLINICAL DATA:  CBC with white blood cell count 16.5, hemoglobin 10.6, MCV  83.9, platelet count 251,000.  Urinalysis specific gravity 1.014, LE and  nitrite negative.  BMET sodium 139, potassium 4.2, chloride 106, cO2 26, BUN  14, creatinine 1.0, AST and ALT 57 and 20 respectively.  Bilirubin 0.7.  Chest x-ray without infiltrate.  Electrocardiogram without acute changes.   ASSESSMENT:  1. Painful right lower extremity most likely secondary to early cellulitis.     Please note ultrasound is negative for deep venous thrombosis.  2. Patient currently on antibiotics for methicillin sensitive Staphylococcus     aureus, day 13 out of 14.  3. History of recurrent cellulitis/chronic venous stasis dermatitis.  4. Questionable chronic obstructive pulmonary disease/asthma, however,     please note the patient denies any significant tobacco use.  5. Probable obstructive sleep apnea, obesity hypoventilation syndrome.  6. Diabetes.  7. Hypertension.  8. Morbid obesity.  9. Gastroesophageal reflux disease.  10.      History of group B Strep bacteremia in February 2005.  11.      Status post removal of Port-A-Cath in May 2005 secondary to line     infection.  12.      Leukocytosis.  13.      Fever.  Please note chest x-ray and urinalysis negative.   RECOMMENDATIONS:  Recommend patient be admitted to medicine floor bed.  Ultrasound of lower extremity which has been done.  Obtain chest x-ray which  has been done.  Also recommend the patient be pancultured.  Add antibiotics  intravenously, Clindamycin for early cellulitis, also recommend the  patient's Ancef be discontinued after completion of two week course.  Will  make further recommendations after review of the above studies.                                               Deirdre Peer. Polite, M.D.   RDP/MEDQ  D:  12/11/2003  T:  12/11/2003  Job:  [id number]

## 2010-11-19 NOTE — Consult Note (Signed)
[name], [name]            ACCOUNT NO.:  [id number]   MEDICAL RECORD NO.:  [id number]          PATIENT TYPE:  INP   LOCATION:  1605                         FACILITY:  [facility]   PHYSICIAN:  Danae Chen, M.D.DATE OF BIRTH:  Jan 07, 1951   DATE OF CONSULTATION:  DATE OF DISCHARGE:  07/22/2005                                   CONSULTATION   DISCHARGE DIAGNOSES:  1.  Lower leg bilateral cellulitis with bacteremia.  2.  Chronic lymphedema.  3.  Morbid obesity.  4.  History of chronic obstructive pulmonary disease.  5.  History of diabetes.  6.  History of hypertension.   DISCHARGE MEDICATIONS:  1.  New medication of Keflex 500 mg p.o. q.i.d. x7 days.  2.  Aspirin 81 mg p.o. daily.  3.  Protonix 40 mg p.o. daily.  4.  Senokot S one p.o. q.h.s. p.r.n.  5.  Risperdal 0.5 mg p.o. b.i.d.  6.  Zoloft 150 mg p.o. daily.  7.  Neurontin 100 mg p.o. t.i.d.  8.  Norvasc 10 mg p.o. daily.  9.  Depakote 150 mg p.o. at night.  10. Folic acid 2 mg p.o. b.i.d.  11. Advair discus 100/50, one puff b.i.d.  12. Sliding scale insulin  Use 3 units for CBG's between 101-150; 4 units      per CBG's 151-200; 7 units for CBG's 201-250; 9 units for CBG's 251-300      and 12 units for CBG's 301-350.  13. Ibuprofen 400 mg p.o. daily p.r.n.  14. Lasix 40 mg p.o. daily.  15. Multivitamin one p.o. daily.  16. Maalox suspension 60 mL p.o. q.4-6h. p.r.n.  17. Tylenol 650 mg p.o. q.4-6h. p.r.n.  18. Phenergan 25 mg p.o. q.4-6h. p.r.n.  19. Dulcolax suppositories 10 mg p.o. p.r.n.  20. Xanax __________ mg p.o. t.i.d. p.r.n.  21. Oxycodone 5 mg to 10 mg p.o. q.4-6h. p.r.n. for moderate to severe pain.   FOLLOWUP:  To follow up with the primary M.D. as needed.   CHIEF COMPLAINT:  The patient is a 60 year old, morbidly-obese black female,  a resident of [facility], who was admitted for complaints  of shaking fevers and chills.  She was found to have lower extremity  cellulitis  and concomitant bacteremia.   HOSPITAL COURSE:  She was started on IV antibiotics.  The patient  defervesced and continued to improved throughout her hospital stay.  As the  patient's cultures came back, her antibiotic coverage was narrowed.  At the  time of discharge she is tolerating oral Keflex well.  As far as her  diabetes, this has been managed with sliding scale insulin.  Although the  patient would benefit from standing Lantus units, and we will prescribe her  10 units at night daily to start.   In regards to her history of chronic obstructive pulmonary disease, we will  continue with her Advair and as-needed nebulizers at the nursing home.  Her  antihypertensive medications as well as medications for a history of  depression/anxiety disorder and bipolar disorder as previously prescribed.   CONSULTATIONS:  None.   PROCEDURE:  None.  LABORATORY DATA:  Blood culture on July 17, 2005, positive for Group-B  strep, one of two cultures.  Hemoglobin A1c was 5.8.   In addition, the patient did have one episode of some chest discomfort  during her stay.  An electrocardiogram was performed, with no evidence of  acute ischemic changes.   Cardiac enzymes were drawn.  No elevation of cardiac enzymes as well.  Renal  preserved with serum creatinine of 1 at the time of discharge.   DISCHARGE PHYSICAL EXAMINATION:  VITAL SIGNS:  Afebrile, vital signs stable.  GENERAL:  She is alert and oriented and eating well, with no complaints.   CONDITION ON DISCHARGE:  Improved.      Danae Chen, M.D.  Electronically Signed     RLK/MEDQ  D:  07/22/2005  T:  07/22/2005  Job:  [id number]

## 2010-11-19 NOTE — Op Note (Signed)
[name], [name]                        ACCOUNT NO.:  [id number]   MEDICAL RECORD NO.:  [id number]                  PATIENT TYPE:   LOCATION:                                       FACILITY:   PHYSICIAN:  Dirk Dress. Katrinka Blazing, M.D.                DATE OF BIRTH:   DATE OF PROCEDURE:  08/29/2003  DATE OF DISCHARGE:                                 OPERATIVE REPORT   A 60 year old female admitted with sepsis, probable pneumonitis with  inadequate venous access.  Multiple attempts have been made to place  peripheral and central lines over the past 48 hours without success.  The  patient had an old Port-A-Cath on the left side, but the dam has ruptured.  She is scheduled for removal of the old system and placement of a new  system.   PREOPERATIVE DIAGNOSIS:  Sepsis, pneumonitis, inadequate venous access.   POSTOPERATIVE DIAGNOSIS:  Sepsis, pneumonitis, inadequate venous access.   PROCEDURE:  Removal of old left subclavian Port-A-Cath and placement of new  left subclavian Port-A-Cath.   SURGEON:  Dirk Dress. Katrinka Blazing, M.D.   DESCRIPTION:  The procedure was done under IV sedation by anesthesia with  continuous monitoring and 1% Xylocaine local infiltration.   The area over the old chamber on the left side was prepped and draped.  Local infiltration with 1% Xylocaine was carried out.  Local infiltration  was carried out, also in the left subclavian area over the proposed  insertion site of the catheter.  Cutdown was done until the catheter and  chamber was found.  The catheter was clamped with a mosquito clamp and then  divided.  An attempt was then made to try to thread a guidewire through the  catheter, but this could not be done.  We tried a 0.038, 0.035, and a 0.032-  inch diameter catheter without success.  The old system was, therefore,  removed and with extreme difficulty the left subclavian vein was accessed.  This took multiple sticks and a lot of maneuvering.   Once this was  accessed, a new catheter was tunneled and a new pocket was  made medial and inferior to the old pocket.  The introducer was placed over  the guidewire and the catheter was positioned in the inferior vena cava.  The catheter was hooked ot the chamber and the chamber was placed in the  pocket uneventfully.  The chamber was sutured to the subcutaneous tissue  with 3-0 Prolene.  The pocket was closed with 3-0 Biosyn.  The system was  flushed with heparinized saline.   Next, the old chamber was removed without difficulty.  The sclerosed pocket,  which was a fibrous capsule, was also removed.  The pocket was closed in  layers using 2-0 Biosyn and 3-0 Biosyn.  The area of dissection of the  catheter in the subclavian area was closed with 2-0 Biosyn.  The skin for  the new chamber  site was closed with 4-0 Dexon.  The skin of the other 2  incisions were closed with running locking 3-0 Prolene.  The new system was  flushed with 3  cc of heparin.  It was then accessed and the system was flushed with 2 more  cc of heparin.  Two OpSites were placed over the new access site.  The  patient tolerated the procedure well.  She was transferred to a bed and  taken to the postanesthetic care unit for follow up chest x-ray and for  short-term monitoring.      ___________________________________________                                            Dirk Dress. Katrinka Blazing, M.D.   LCS/MEDQ  D:  08/29/2003  T:  08/29/2003  Job:  [id number]

## 2010-11-19 NOTE — Discharge Summary (Signed)
NAME:  [name], [name]                      ACCOUNT NO.:  [id number]   MEDICAL RECORD NO.:  [id number]                   PATIENT TYPE:  INP   LOCATION:  A309                                 FACILITY:  [facility]   PHYSICIAN:  Vania Rea, M.D.              DATE OF BIRTH:  March 24, 1951   DATE OF ADMISSION:  08/27/2003  DATE OF DISCHARGE:  09/08/2003                                 DISCHARGE SUMMARY   PRIMARY CARE PHYSICIAN:  Dr. Elpidio Anis.   DISCHARGE DIAGNOSES:  1. Extensive cellulitis of the left foot, left leg, and left lower side,     significantly improved.  2. History of recurrent cellulitis of the left lower extremity.  3. History of chronic bilateral lower extremity ulcers.  4. Status post replacement of left subclavian Port-A-Cath on August 29, 2003.  5. Acute asthma exacerbation, resolved.  6. Diabetes mellitus, improved, borderline controlled.  7. Depression, stable.  8. Gastroesophageal reflux disease, stable.  9. Chronic pain syndrome, controlled.  10.      Other diagnoses as noted in the history and physical of August 27, 2003.   DISPOSITION:  Discharged to home.   DISCHARGE CONDITION:  Stable condition.   DISCHARGE MEDICATIONS:  1. Clindamycin 900 mg three times daily for 10 days.  2. Levaquin 500 mg daily for 5 days.  3. Vancomycin 1750 mg IV daily by home health nurse.  4. Gentamicin 200 mg IV daily by home health nurse.  5. Metformin (Glucophage XR 500 mg) 1 tablet twice daily.  6. Zoloft 100 mg daily.  7. Imipramine 50 mg at bedtime.  8. Spironolactone 50 mg daily.  9. Advair Diskus 100/150, 1 puff twice daily.  10.      Atrovent, Proventil nebulizers q.i.d. p.r.n.  11.      Aciphex 20 mg daily.  12.      Aspirin 81 mg daily.  13.      Duragesic patch 25 mcg/h, change every 3 days.  14.      Metamucil when necessary for constipation.   HOSPITAL COURSE:  Please refer to history and physical of August 27, 2003.  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  Presented with a 2-day history of  fever and shortness of breath.  Brought in and investigated and found to  have an extensive cellulitis of the left lower extremity with a white count  of 20,000, a temperature of 102, and no evidence of myocardial infarction or  pulmonary embolism by investigation but was having acute exacerbation of  asthma.   Blood cultures on this patient grew Streptococcus G, and because of the  patient's history of PENICILLIN allergy and the limited sensitivity  report  on the blood culture report, antibiotic to control this infection was a  challenge.  The patient failed to respond to clindamycin, Levaquin, and  gentamicin but eventually responded when vancomycin was added.  Because of  the extensive cellulitis and because of the difficulty controlling it, the  patient is being sent home on all 4 medications, gentamicin and vancomycin  IV and clindamycin and Levaquin p.o.  The Levaquin and clindamycin will be  discontinued after a few days.  If the patient starts to worsen they can be  reinstituted.   The patient had a nonfunctioning left subclavian Port-A-Cath which was  replaced on her second hospital day.  The patient is sent with home health  care for administration of her antibiotics and to monitor her rehabilitation  status and to monitor her diabetic control.   The patient's asthma exacerbation was easily controlled with Humibid and  Atrovent, albuterol nebulizations, along with an Advair inhaler.   The patient required a CT scan of the chest to rule out acute pulmonary  embolus because of the acute dyspnea in this obese and relatively immobile  lady.  Post CT the patient was taken off the metformin and put on sliding   scale insulin.  However, metformin has now been reinstituted with moderate  glucose control.  The patient's hemoglobin A1C on admission was 6.8, so her  blood sugars seemed to be overall fairly well controlled.   The patient's depression is much improved.  When the patient was first  admitted and her foot was looking very necrotic and ugly, the patient was  depressed as an amputation was very much in the offing.  However, as the  patient began to improve she became afebrile and her white count fell from a  peak of 23,000 to today 10.2 thousand, and the foot is feeling better.  She  is feeling much happier.  She is laughing and joking.  She is asking  questions about diet.  She is very interested in losing weight.  We have  maintained her antidepressants as Zoloft 100 mg daily with imipramine at  bedtime.  Her primary care physicians may wish to review if they want to  combine an SSRI with __________ .   With the patient's morbid obesity she has been advised to diet aggressively,  especially in view of her limited mobility.  She has tolerated a 1500-  calorie diet while in the hospital and has been advised to substitute more  green leafy vegetables for starchy foods.   FOLLOW-UP:  The patient will follow up by Dr. Early Osmond as her primary care  physician.  The patient is to have home health care for rehabilitation to  monitor her blood sugars and to administer her intravenous antibiotics and  also to supervise her overall health.   SPECIAL INSTRUCTIONS:  Because of receiving intravenous vancomycin and  gentamicin, the patient is being recommended to have a check on her serum  chemistry and trough levels of vancomycin and gentamicin 4 days after  discharge, results to be sent to Dr. Katrinka Blazing or Dr. Loleta Chance.     ___________________________________________                                         Vania Rea, M.D.  LC/MEDQ  D:  09/08/2003  T:  09/09/2003  Job:  [id number]   cc:   Jerolyn Shin C.  Katrinka Blazing, M.D.  [address]  [place]  [place] [address]  Fax: [phone]
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
## 2010-11-19 NOTE — Discharge Summary (Signed)
NAME:  [name], [name]                      ACCOUNT NO.:  [id number]   MEDICAL RECORD NO.:  [id number]                   PATIENT TYPE:  INP   LOCATION:  A310                                 FACILITY:  [facility]   PHYSICIAN:  Dirk Dress. Katrinka Blazing, M.D.                DATE OF BIRTH:  Jul 21, 1950   DATE OF ADMISSION:  02/27/2003  DATE OF DISCHARGE:  03/11/2003                                 DISCHARGE SUMMARY   DISCHARGE DIAGNOSES:  1. 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  2. Acute cellulitis of left leg.  3. Hypertension.  4. Uncontrolled diabetes mellitus.  5. Gastroesophageal reflux disease.  6. Osteoarthritis.  7. Bronchial asthma.  8. Chronic anemia.  9. Depression with anxiety.   DISPOSITION:  The patient is discharged in stable satisfactory condition.   DISCHARGE MEDICATIONS:  1. Advair 100/50 one puff q.12h.  2. Zoloft 100 mg q.d.  3. Metformin 500 mg q.d. with breakfast.  4. Prilosec 40 mg q.d.  5. Aspirin 81 mg q.d.  6. Ferrous sulfate 325 mg q.d.  7. Imipramine 50 mg q.d.  8. Cozaar 50 mg q.d.  9. Keflex 500 mg q.6h.  10.      ______ 100 mg q.4-6h. p.r.n.  11.      Albuterol inhaler two puffs q.6h.   FOLLOWUP:  The patient will have home health by _______ [facility],  and will be seen in the office one week post discharge.   SUMMARY:  A 60 year old female with a history of significant stasis ulcer  changes due to venous hypertension.  She had chronic ulcerations of her left  leg and foot.  She had previous surgery, including wide excision and skin  grafting.  She had been followed primarily by Dr. Tanda Rockers.  Her ulcers were  fairly well-healed until February 19, 2003, when she presented to the office  with an open ulcer on the left lower leg for a period of three days.  She  returned on about February 27, 2003, and was noted to have an open draining  ulceration of the medial and lateral aspect of her foot with cellulitis and  increased purulent drainage.  She was felt to have significant cellulitis,  and it was felt that she could not adequately treat this at home.  Examination of her leg revealed chronic venous stasis disease bilaterally,  much worse on the left than on the right.  She had chronic ulceration with  purulent drainage and cellulitis of the dorsum of the left lateral foot as  well as the lower left leg about the medial malleolus.  No pulses could be  felt.  She had chronic fungal changes of the nails of both feet.  Wound  cultures were taken and the patient was started on Ancef and Levaquin.  She  was continued on her previous antibiotics.  The wound cultures subsequently  grew out Staphylococcus aureus which was  sensitive to Cleocin, Ancef, and  gentamycin.  The Levaquin was discontinued and Cleocin was added.  She had a  good response.  She had excellent healing of her ulcers.  She did well and  was felt to be adequately recovered for discharge home by March 11, 2003.  At the time of discharge, she was afebrile, vital signs were stable, she had  trace edema of her left leg, there was no ulceration except her small  healing clean ulcer of the left ankle.  She was in satisfactory condition at  the time of discharge.     ___________________________________________                                         Dirk Dress. Katrinka Blazing, M.D.   LCS/MEDQ  D:  05/04/2003  T:  05/04/2003  Job:  [id number]

## 2010-11-19 NOTE — H&P (Signed)
NAMERAZAN, SILER NO.:  [id number]   MEDICAL RECORD NO.:  [id number]          PATIENT TYPE:  INP   LOCATION:  1605                         [facility]   PHYSICIAN:  Lonia Blood, M.D.DATE OF BIRTH:  19-Feb-1951   DATE OF ADMISSION:  07/17/2005  DATE OF DISCHARGE:                                HISTORY & PHYSICAL   CHIEF COMPLAINT:  Shaking, fever and chills.   HISTORY OF PRESENT ILLNESS:  [name] is a 59 year old female  who is morbidly obese.  She is essentially bed-ridden and therefore lives at  [facility] because of her obesity.  She has a history of severe  bilateral lower extremity pressure ulcers/venous stasis ulcers with  recurrent cellulitis.  She was placed on Avelox p.o. approximately 10 days  ago because of difficulty with the wounds and presumed wound cellulitis of  the left lower leg.  Today, however, she began to experience shaking, fever  and chills.  She was sweating all over.  She became extremely weak and felt  very nauseated.  Nursing home staff checked her temperature and found her  temperature to be 103.8 in the nursing home.  As a result, EMS was called  and the patient was transferred to the emergency room.  In the emergency  room, the patient was found to be hypotensive with blood pressure 104  systolic and heart rate of 117.  The patient otherwise states she is in her  usual state of health and has no other localizing complaints.   REVIEW OF SYSTEMS:  Comprehensive review of systems is unremarkable with the  exception of elements of history of present illness as noted above.   PAST MEDICAL HISTORY:  1.  Bilateral lower extremity venous stasis/pressure ulcers with chronic      cellulitis requiring numerous previous skin flaps/plastic surgery.  2.  Bipolar disorder.  3.  Morbid obesity.  4.  COPD.  Sleep study in May 2006, without evidence of sleep apnea.  5.  History of MRSA septicemia secondary to a  Port-A-Cath which was      originally placed secondary to group G Streptococcus bacteremia without      endocarditis.  6.  Diabetes mellitus.  7.  Depression.  8.  Anxiety disorder.  9.  Gastroesophageal reflux disease.  10. Hypertension.  11. Status post cholecystectomy.  12. Ventral hernia repair in followup from cholecystectomy.  13. Chronic normocytic anemia.  14. Status post appendectomy.   MEDICATIONS:  1.  Motrin 400 mg p.o. b.i.d.  2.  Lasix 40 mg p.o. daily.  3.  Risperdal 0.5 mg p.o. b.i.d.  4.  Zoloft 150 mg daily.  5.  Neurontin 100 mg t.i.d.  6.  Norvasc 10 mg daily.  7.  Senokot two tablets nightly.  8.  Prilosec 20 mg daily.  9.  Depakote ER 750 mg nightly.  10. Aspirin 81 mg daily.  11. Xanax 0.5 mg t.i.d.  12. Prednisone 5 mg daily.  13. Sliding scale insulin.  14. Folate 2 mg b.i.d.  15. Advair 100/50 b.i.d.   ALLERGIES:  PENICILLIN, NYQUIL, BEE STINGS.  FAMILY HISTORY:  The patient's mother is alive at age 18.  The patient's  father is alive with prostate cancer.   SOCIAL HISTORY:  The patient is single.  She lives at [facility].  She  is disabled due to morbid obesity.   DIET:  No added salt with no dairy.   LABORATORY DATA AND X-RAY FINDINGS:  White count is markedly elevated at  7.2, hemoglobin low at 9.5, MCV 86, platelets 214.  BMET is normal with the  exception of elevated bicarb at 40, creatinine 1.0, BUN 15.  UA is negative  for evidence of infection.   Chest x-ray reveals suboptimal inspiration and essentially useless.  A 12-  lead EKG reveals tachycardia at 109 beats per minute with no acute ST and T  wave change.   PHYSICAL EXAMINATION:  VITAL SIGNS:  Temperature 103.6, blood pressure  104/40, heart rate 117, respirations 20, O2 saturations 95% on room air.  GENERAL:  Morbidly obese female who appears older than stated age of 9 who  does no appear to be in acute respiratory distress.  LUNGS:  Very distant breath sounds.  Exam  is extremely difficult.  No  appreciable wheezes.  No appreciable crackles, although they would be  difficult to find.  NECK:  Neck exam is essentially unable to be accomplished due to patient's  body habitus.  I do not appreciate JVD, but it would be very easy to miss.  I am not able to palpate the thyroid.  ABDOMEN:  Morbidly obese, soft, bowel sounds are present and there is no  mass appreciable.  The abdomen is not distended acute.  There is no rebound.  CARDIAC:  Heart sounds are distant, but regular with tachycardia without  gallop or rub appreciable.  I am not able to comment on murmurs because of  the soft nature of the patient's heart sounds.  EXTREMITIES:  There is a small, approximate quarter-size ulceration in the  right lower extremity on the anterior aspect which appears to be healing  slowly and does not have any surrounding erythema.  There is no significant  edema of the right lower extremity.  The left lower extremity shows  extensive ulceration across the entire lateral anterior and medial aspect of  the lower extremity.  There does not appear to be ulceration on the  posterior aspect of the lower extremity.  There is good granulation tissue  at the base of a lot of this wound, but the wound is approximately 2.5  inches tall and wraps completely around the lateral and anterior portions of  the lower extremity.  Multiple areas of necrotic slough are noted within the  wound itself.  There is surrounding erythema and there is asymmetrical edema  of this extremity.  NEUROLOGIC:  Nonfocal.  CUTANEOUS:  The patient's pannus is retracted in the superior fashion.  There is no evidence of significant infection or ulceration under the  patient's panus as best I can tell.   ASSESSMENT/PLAN:  1.  Bacteremia.  Most likely cause of the patient's bacteremia is her severe      left lower extremity venous stasis ulceration and probable overlying     cellulitis.  She does have a  history of methicillin-resistant      Staphylococcus aureus and therefore I will treat her empirically with      vancomycin even though this was not a cutaneous infection previously.  I      will also place her on Zosyn for  broad coverage given the patient's      diabetes and also her nursing home status.  Wound care will be consulted      to ensure that the appropriate dressing is applied, but for now we will      use normal saline wet-to-dry and change b.i.d.  Will obtain hepatic      function panel to assess the patient's albumin and request nutrition      consult to maximize her protein stores.  Blood cultures have been      obtained in an attempt to isolate the offending pathogen.  2.  Severe bilateral lower extremity venous statis ulcers, left much greater      than right.  Please see discussion above.  3.  Bipolar disorder.  This appears to be reasonably well-controlled at      present.  We will continue the patient's outpatient medications.  4.  Hypotension with tachycardia.  These are all felt to be sequelae of the      patient's bacteremia.  I will hydrate the patient aggressively with      normal saline.  I do not feel that she is per se intravascularly      depleted, but I do think that she likely is suffering with a significant      degree of dilation due to septicemia and therefore, I wish to counter      this with aggressive fluid resuscitation.  We will follow the patient's      in's and out's closely.  5.  Chronic normocytic anemia.  The patient has a chronic anemia.  Will      simply follow hemoglobin.  If her hemoglobin drops below 8 during this      hospitalization, we will consider transfusion.  6.  Chronic prednisone therapy.  The exact indication for the patient's      chronic prednisone therapy is not clear.  Perhaps this is due to her      reported history of chronic obstructive pulmonary disease.  Nonetheless,      she is displaying early signs of crisis.  Will  therefore dose the      patient with stress dose hydrocortisone and will begin taper as rapidly      as possible.  7.  Diabetes.  Sliding scale insulin will be administered and will follow      her sugars closely.  8.  Chronic obstructive pulmonary disease.  The patient is not wheezing at      this time and her respiratory status appears to be stable.  We will      follow her pulmonary symptoms closely.      Lonia Blood, M.D.  Electronically Signed     JTM/MEDQ  D:  07/17/2005  T:  07/18/2005  Job:  [id number]

## 2010-11-19 NOTE — Discharge Summary (Signed)
NAME:  [name], [name]                      ACCOUNT NO.:  [id number]   MEDICAL RECORD NO.:  [id number]                   PATIENT TYPE:  INP   LOCATION:  A304                                 FACILITY:  [facility]   PHYSICIAN:  Vania Rea, M.D.              DATE OF BIRTH:  02/02/1951   DATE OF ADMISSION:  DATE OF DISCHARGE:  10/01/2003                                 DISCHARGE SUMMARY   PRIMARY CARE PHYSICIAN:  Dr. Jerolyn Shin C. Smith.   NEPHROLOGIST:  Dr. Jorja Loa.   DISCHARGE DIAGNOSES:  1. Acute renal failure secondary to gentamicin and vancomycin therapy.  2. Cellulitis of the left lower extremity, resolved.  3. Morbid obesity.  4. Diabetes type 2.  5. Asthma.  6. Gastroesophageal reflux disease.  7. Depression.  8. Chronic pain.   MEDICATIONS:  1. Advair Diskus 100/50, one puff twice daily.  2. Albuterol nebulizations 2.5 mg every six hours.  3. Aspirin 81 mg daily.  4. Duragesic patch 50 micrograms q.72 hours.  5. Lopressor 75 mg twice day.  6. Zoloft 100 mg daily.   DISPOSITION:  Discharged to a skilled care nursing facility.   DISCHARGE CONDITION:  Fair.   ALLERGIES:  PENICILLIN.   HOSPITAL COURSE:  This is a morbidly-obese African American lady who was  originally admitted on February 23 with a severe cellulitis of the left  lower extremity, and whose blood cultures grew group g Streptococcus.  Various remedies were tried for the management of the cellulitis, and it got  progressively worse.  Amputation was considered until vancomycin and  gentamicin were started.  The patient responded rapidly.  By March 7, the  patient was considered to be in good enough condition to continue her  antibiotic therapy at home.   The patient has a Port-A-Cath in place, and was discharged with a regimen  for daily vancomycin and gentamicin.  The patient despite her obesity and  her difficulty with mobility and her clubfoot type defect, the patient  insisted on going  home.  However, after two days as an outpatient, the  patient returned and said she could not care for herself at home.  She was  readmitted with a plan to place her in a facility to complete her 10-day  course of antibiotics.  While an inpatient on antibiotics, it was noted that  her BUN and creatinine was steadily rising, particularly her creatinine,  from a level of 0.8 at the time of her second admission.  Within a few days,  it rose to 1.6, and vancomycin and gentamicin were discontinued, and levels  were monitored daily.  Creatinine has continued to rise steadily despite  discontinuation of gentamicin and vancomycin.  Yesterday, March 28, her  blood work showed a sodium of 137, potassium of 4.3, chloride 97, CO2 of 32,  glucose 105, BUN 21, and creatinine of 3.6, calcium of 8.8.  We have been  monitoring the patient in  the hospital with the hope this acute renal  failure would gradually clear with the discontinuation of her __________.  However BUN and creatinine continued to rise.  The patient is being  monitored by Dr. Kristian Covey who has been advising hydration throughout.  The  patient's cellulitis is now much better.  Because of other elevated blood  levels, the patient has had at least 10 days of therapeutic vancomycin and  no further consideration has been given to treatment of her cellulitis.  However, the patient is being discharged to facility for continued  management of her acute renal failure which we think will probably reverse.  If not, she may eventually need dialysis.   As far as her diabetes is concerned, the patient on the initial admission  required sliding scale insulin to control her diabetes, but on this present  admission, her fingerstick blood sugars have been in the 90's, and 110 to  119, and she has not required insulin.  She is maintained on a 1500 calorie  diabetic diet, having emphasized the importance of weight loss to her.   Despite the history of GERD,  the patient is not on PPI, and has in fact not  required it this admission.  She has also not required any supplemental  medications for pain, and seems to be adequately controlled with the  Duragesic 50 micrograms daily.  Her depression has also been stable.   Over the past few weeks, the patient's blood pressure has been noted to be  elevated with systolics in the 140's and diastolics often in the 80's.  We  have elected to start treatment with Lopressor 25 mg twice daily.   PHYSICAL EXAMINATION:  VITAL SIGNS:  Today her vitals are temperature 97.3,  pulse 56, respirations 24, blood pressure 126/75.  GENERAL:  She is alert and oriented x3.  She is wheezing today.  She  occasionally wheezes and requires additional nebs.  CHEST:  Otherwise unremarkable.  There are no crackles.  ABDOMEN:  Grossly obese, nontender.  There is no obvious organomegaly.  EXTREMITIES:  On lower extremities, there is no cellulitis or tenderness at  this time.  The scaling from the healed cellulitis had been managed with  petrolatum ointment daily.   FOLLOWUP:  1. With the physician associated with the skilled nursing facility to which     she is going.  2. She will need monitoring by nephrologist.   SPECIAL INSTRUCTIONS:  1. Please check serum chemistry every two to three days to monitor the     movement of her serum creatinine.  2. Please avoid dehydration.  3. Please monitor fingersticks at least twice daily.  4. Please call Dr. Vania Rea at [phone] for any assistance we may be     able to offer.     ___________________________________________                                         Vania Rea, M.D.   LC/MEDQ  D:  09/30/2003  T:  09/30/2003  Job:  [id number]

## 2010-11-19 NOTE — Op Note (Signed)
NAME:  [name], [name]                      ACCOUNT NO.:  [id number]   MEDICAL RECORD NO.:  [id number]                   PATIENT TYPE:  INP   LOCATION:  2035                                 FACILITY:  [facility]   PHYSICIAN:  Gabrielle Dare. Janee Morn, M.D.             DATE OF BIRTH:  1951/01/31   DATE OF PROCEDURE:  11/17/2003  DATE OF DISCHARGE:                                 OPERATIVE REPORT   PREOPERATIVE DIAGNOSIS:  Infected Port-A-Cath.   POSTOPERATIVE DIAGNOSIS:  Infected Port-A-Cath.   PROCEDURES:  1. Removal of infected Port-A-Cath.  2. Attempted placement of right subclavian vein triple-lumen catheter.   SURGEON:  Gabrielle Dare. Janee Morn, M.D.   ANESTHESIA:  MAC plus local.   HISTORY OF PRESENT ILLNESS:  The patient is a 60 year old African-American  female who was admitted for a fever workup.  She is a nursing home patient  who had a Port-A-Cath placed for vascular access.  Current workup  demonstrated bacteremia, including cultures positive from the Port-A-Cath.  I was asked to remove her Port-A-Cath today and place a central line.  She  was brought to the operating room.   PROCEDURE IN DETAIL:  Informed consent was obtained.  The patient's  coagulation panel was checked, within normal limits.  She was brought to the  operating room and MAC anesthesia was administered.  Her bilateral upper  chest and neck were prepped and draped in a sterile fashion.  First I  directed my attention to the old Port-A-Cath.  Marcaine 0.5% with  epinephrine was used for local anesthetic.  The old incision was infiltrated  in addition to the surrounding area.  An incision was made along her old  scar from the Port-A-Cath insertion.  The subcutaneous tissues were  dissected down, revealing the Port-A-Cath.  The stay sutures that had been  placed were cut and it was mobilized.  There was a pocket of fluid around it  and on entering that, this fluid was sent for culture.  Subsequently the  Port-A-Cath  was freed up and removed and the catheter itself withdrew very  easily and came out in its entirety, and the Port-A-Cath with attached.  The  wound was copiously irrigated.  Hemostasis was assured.  The wound was then  packed with quarter-inch iodoform and loosely closed with staples.  I then  turned my attention to placement of the central line.  Her right upper chest  near her clavicle area was infiltrated with 1% lidocaine for pain relief.  Her right subclavian vein was entered, but the wire would not pass  appropriately.  A stick was done ____________ we were able to enter the  subclavian vein, but the wire could not be appropriately advanced after  going a few centimeters past the insertion point.  At this time I asked Dr.  Gelene Mink from anesthesia to attempt a right internal jugular catheter.  His  procedure will be documented separately, but he did  encounter the same  problem, easily entering the vein but unable to fully pass the wire.  The  procedure was terminated.  A little sterile dressing was placed on her right  chest as well, and she was taken to the recovery room in stable condition.  We will check a chest x-ray.  Sponge, needle, and instrument counts were  correct.                                               Gabrielle Dare Janee Morn, M.D.   BET/MEDQ  D:  11/17/2003  T:  11/18/2003  Job:  [id number]

## 2010-11-19 NOTE — Discharge Summary (Signed)
NAME:  [name], [name]                      ACCOUNT NO.:  [id number]   MEDICAL RECORD NO.:  [id number]                   PATIENT TYPE:  INP   LOCATION:  3730                                 FACILITY:  [facility]   PHYSICIAN:  Alvester Morin, M.D.               DATE OF BIRTH:  24-Mar-1951   DATE OF ADMISSION:  10/26/2003  DATE OF DISCHARGE:  10/28/2003                                 DISCHARGE SUMMARY   DISCHARGE DIAGNOSES:  1. Bradycardia.  2. Hypoxia.  3. Chest pain.  4. Gastroesophageal reflux.  5. Oral candidiasis.  6. Anemia.  7. Diabetes mellitus.  8. Renal insufficiency.  9. Chronic venous stasis dermatitis.   DISCHARGE MEDICATIONS:  1. Advair 100/50 one puff b.i.d.  2. Zoloft 100 mg p.o. daily.  3. Fentanyl patch 50 mcg q.72h., next one due April 26 at midnight.  4. Aspirin 81 mg p.o. daily.  5. Protonix 40 mg p.o. daily.  6. Albuterol 2.5 mg nebulizer b.i.d.  7. Sensitive sliding scale insulin using regular insulin with bedtime     coverage.  8. Diflucan 100 mg p.o. daily x5 more days.   FOLLOW-UP:  The patient will follow up with her primary care physician in  one to two weeks considering that she has Medicaid and __________ is the  primary and the patient is unable to see anyone in [place] until she has  that changed over.   CONSULTATIONS:  None.   PROCEDURE:  None.   BRIEF ADMISSION HISTORY:  This is a 60 year old African-American female,  morbidly obese, with a history of diabetes, asthma, venous stasis  dermatitis, GERD, who presents with chest pain described as sharp,  substernal, radiating to the left side.  Pain was worse after eating.  Patient states that it improved with Mylanta.  Pain was constant since the  night prior to admission.  The patient also complained of mild pain and  odynophagia for about one week.  The patient had been on Aciphex prior to  the last admission on March 12.  It was discontinued at discharge.  The  patient complains of  shortness of breath, dyspnea on exertion  intermittently.  The patient states that this has been a chronic issue with  her; however, she feel that it is a little worse over the past week and the  patient also questioned administration of her heart pills.  She states  that they were giving her one twice a day and began giving her three twice a  day, which we believe was Lopressor.   Admission vitals:  Heart rate of 48, blood pressure 143/98, temperature of  97.1, respirations of 24.  Initial O2 saturation was 84% on room air and was  94% on 2 L.   ADMISSION LABORATORY DATA:  A pH of 7.29, PO2 of 61.  I-STAT:  Sodium 139,  potassium 4.1, chloride 105, bicarb 29.6, BUN 15, creatinine 1.6, glucose of  97.  Point of care enzymes:  Myoglobin 112, CK-MB of 3.3, troponin of less  than 0.05.  EKG shows sinus bradycardia.  CBC showed a white blood cell  count of 6.6, hemoglobin 9.5, hematocrit of 28.1, platelets of 252, absolute  neutrophil count 4.2, MCV of 85.6.   HOSPITAL COURSE:  Problem 1.  CHEST PAIN:  Pain was atypical in nature and was considered  probably secondary to the patient's GERD; however, considering body habitus  and diabetes, also wanted to exclude cardiac etiology.  The patient had a  repeat EKG the morning after admission, which was unchanged, still showing  sinus bradycardia.  The patient had serial cardiac enzymes, which were all  negative, with CKs being 114 to 99 to 91, CK-MB being 2.1 to 1.8, troponin  being less than 0.01 x3.  As far as her GERD, the patient was placed on  Protonix b.i.d. considering she had not been on her Aciphex in approximately  one month and pain was worse with food.  During hospitalization chest pain  slowly began to resolve.  At discharge the patient still had minimal chest  pain but did feel it was much better.   Problem 2.  HYPOXIA AND RESPIRATORY ACIDOSIS:  Which was felt to be likely  secondary to the patient's bradycardia, which will be  discussed later, and  mild interstitial edema.  The patient was given Lasix x2 during  hospitalization with good diuresis overnight.  As patient's heart rate began  to increase after diuresis, O2 saturation began to increase.  She was  saturating 98-100% on 2 L.  The patient was weaned off O2 and remained at 97-  100% on room air.   Problem 3.  BRADYCARDIA:  This was felt secondary to increasing dose of beta  blocker.  According to hospital records from [facility], the patient was  placed on Lopressor 25 mg b.i.d. but in the dictation stated 75 mg b.i.d.,  which she was receiving at [facility], and we felt that this was the  reason for her bradycardia and mild pulmonary edema.  The patient's  Lopressor was held during hospitalization and heart rate slowly increased to  high 60s, low 70s at discharge.  It was felt that the patient did not need  to be restarted on any beta blocker or other hypertensive medications  considering blood pressure was 120-120s/50-68 the 24 hours before discharge.   Problem 4.  ORAL CANDIDIASIS:  It was felt that this is secondary to  patient's diabetes and her use of Advair.  The patient states that she was  not rinsing after the use of Advair.  She was given instruction as to how to  do this and was begun on Diflucan orally.  Ginette Pitman has improved significantly  with no reported odynophagia at discharge.  It was felt that the patient  needed approximately five more days of treatment to assure the resolution of  the oral candidiasis.   Problem 5.  ANEMIA:  The patient's hemoglobin is approximately baseline.  We  could not find any records of patient's anemia being worked up; therefore,  the patient did get iron studies with iron being 46, ferritin being 186,  total iron-binding capacity of 287, iron percent saturation was 16.  The  patient's vitamin B12 was 686 and ferritin 186.  Red blood cell folate was 673.  It was felt that the patient's anemia is of  chronic disease and iron  therapy was not initiated during hospitalization.   Problem  6.  DIABETES:  The patient had a hemoglobin A1C checked and fasting  lipid panel done.  The fasting lipid panel showed a total cholesterol of  164, triglycerides of 211, LDL of 86, and HDL of 36.  Her hemoglobin A1C was  6.0.  The patient did not require any oral hypoglycemics during  hospitalization.  She was placed on sensitive sliding scale, of which she  only received approximately two units during the hospitalization.  CBGs 24  hours prior to discharge were anywhere from 118-146.   Problem 7.  RENAL INSUFFICIENCY SECONDARY TO ACUTE RENAL FAILURE ON HER  PRIOR ADMISSION:  Creatinine stable and at discharge 1.7, with good urine  output.   Problem 8.  ASTHMA:  The patient was stable on her Advair 100/50.  She had  minimal wheezing the day of discharge, and it was decided that patient could  benefit from albuterol p.r.n. or at least once or twice a day.  The patient  was discharged with albuterol nebulizer b.i.d.  She was receiving them q.6h.  prior to admission.      Anastasio Auerbach, MD                          Alvester Morin, M.D.    AD/MEDQ  D:  10/28/2003  T:  10/28/2003  Job:  [id number]   cc:   [place]   Anastasio Auerbach, MD  Fax: [phone]

## 2010-11-19 NOTE — Procedures (Signed)
[place]  Patient:    [name], [name]                   MRN: [id number] Proc. Date: 12/20/99 Adm. Date:  [id number] Disc. Date: [id number] Attending:  Nelda Marseille CC:         Petra Kuba, M.D.             Montey Hora, M.D., [facility]                           Procedure Report  PROCEDURE:  Colonoscopy.  INDICATIONS:  Guaiac positivity, multiple GI complaints. Consent was signed after risks, benefits, methods and options were thoroughly discussed in the office.  MEDICATIONS:  Demerol 90, Versed 8.  PROCEDURE:  Rectal inspection pertinent for external hemorrhoids.  Digital examination was negative.  The video colonoscope was inserted, easily advanced around the colon to the cecum.  This required some abdominal pressure, but no position changes.  Cecum was identified by the appendiceal orifice and ileocecal valve.  In fact, the scope inserted a short ways in the terminal ileum, which was normal.  Photo documentation was obtained.  No blood was seen on insertion.  Prep in the right side was fairly poor, there was stool adherent to the walls of the colon, some of which could be washed off. Majority was washed and suctioned, although, we could not withdraw all.  We withdrew back to the hepatic flexure and then readvanced to the cecum, rolled her on her back and re-withdrew to try to change the puddles of stool that were unable to be suctioned. No abnormality was seen.  The rest of the prep was fairly adequate.  A lot of washings and suctioning needed to be done. As we slowly withdrew back to the rectum, no polypoid lesions, masses, heme or any other abnormality, including diverticuli were seen.  As we slowly withdrew back to the rectum, once back in the rectum, the scope retroflexed, pertinent for some internal hemorrhoids.  Scope was drained, readvanced a short ways up the sigmoid, air was suctioned, scope removed.  The patient tolerated  the procedure well.  There were no obvious immediate complication.  ENDOSCOPIC DIAGNOSES: 1. Internal, external hemorrhoids. 2. Fairly poor prep particularly on the right side, better on the left. 3. Otherwise within normal limits to the terminal ileum without any heme being    seen.  PLAN:  Continue work-up with an EGD, would use GoLYTELY prep next time. Please see endoscopy dictation for details. DD:  12/20/99 TD:  12/22/99 Job: [id number]

## 2010-11-19 NOTE — H&P (Signed)
NAME:  [name], SHIPPEE                      ACCOUNT NO.:  [id number]   MEDICAL RECORD NO.:  [id number]                   PATIENT TYPE:  AMB   LOCATION:  DAY                                  FACILITY:  [facility]   PHYSICIAN:  Jerolyn Shin C. Katrinka Blazing, M.D.                DATE OF BIRTH:  1950-10-09   DATE OF ADMISSION:  DATE OF DISCHARGE:                                HISTORY & PHYSICAL   HISTORY OF PRESENT ILLNESS:  [age]-year-old female with history of  recurrent heartburn, indigestion and difficulty swallowing.  She has chronic  lower abdominal pain.  There is a history of chronic anemia.  Her bowel  movements have been regular.  There has not been melena or rectal bleeding.  Family history is unknown.  The patient is scheduled for upper and lower  endoscopies.   PAST HISTORY:  She has diabetes mellitus, hypertension, gastroesophageal  reflux disease, osteoarthritis, bronchial asthma, chronic hemostasis  disease.   PAST SURGICAL HISTORY:  Abdominal hysterectomy, some other type of gastric  surgery, cholecystectomy, carpal tunnel decompression and Port-A-Cath  insertion.   MEDICATIONS:  1. Vicodin one b.i.d.  2. Aciphex 20 mg every day.  3. Hemocyte Plus every day.  4. Advair 100/50 mcg q.12h.  5. Combivent two puffs b.i.d.  6. Imipramine 50 mg q.h.s.  7. Demadex 20 mg b.i.d.  8. Zoloft 100 mg q.h.s.  9. Glucophage 500 mg one-half tab q.d.   ALLERGIES:  PENICILLIN, ASPIRIN and NYQUIL.   PHYSICAL EXAMINATION:  GENERAL:  She is a short-framed female who looks  older than her stated age.  She is unable to stand and has difficulty moving  from her wheelchair to the examining table.  VITAL SIGNS:  Blood pressure 124/80, pulse 88, respirations 20.  HEENT:  Unremarkable except for dentures.  NECK:  Neck is supple.  No JVD or bruit.  CHEST:  Chest clear to auscultation.  HEART:  Heart regular rate and rhythm without murmur, gallop or rub.  ABDOMEN:  Well-healed surgical scar.   Nontender.  No mass.  EXTREMITIES:  Chronic scarring with chronic venous stasis disease with a  small ulcer on the left leg.  NEUROLOGIC:  Exam reveals diffuse weakness of the lower extremities, which  is chronic in nature.  Cranial nerves intact.  Hypoactive reflexes.   IMPRESSION:  1. Chronic abdominal pain of uncertain etiology.  2. Gastroesophageal reflux disease.  3. Chronic anemia.  4. Hypertension.  5. Osteoarthritis.  6. Chronic stasis ulcer.  7. Bronchial asthma.   PLAN:  EGD and colonoscopy.                                               Dirk Dress. Katrinka Blazing, M.D.    LCS/MEDQ  D:  04/09/2002  T:  04/10/2002  Job:  [id number]  

## 2010-11-19 NOTE — Discharge Summary (Signed)
NAME:  Alice, [name]                      ACCOUNT NO.:  [id number]   MEDICAL RECORD NO.:  [id number]                   PATIENT TYPE:  INP   LOCATION:  5707                                 FACILITY:  [facility]   PHYSICIAN:  Deirdre Peer. Polite, M.D.              DATE OF BIRTH:  1950/12/14   DATE OF ADMISSION:  11/16/2003  DATE OF DISCHARGE:                                 DISCHARGE SUMMARY   DISCHARGE DIAGNOSES:  1. Methicillin-sensitive Staphylococcus aureus bacteremia, secondary to [employer].  Patient is to continue intravenous antibiotics and Zithromax q8h     for two more weeks.  Please note transesophageal echocardiogram negative     for endocarditis.  2. Resolved cellulitis of left lower extremity.  3. Chronic obstructive pulmonary disease.  4. Probable obstructive sleep apnea; however, the patient refuses bilevel     positive airway pressure (BiPAP).  5. Azotemia, resolved.  6. Diabetes.  7. Hypertension.  8. Depression.  9. Morbid obesity.  10.      Gastroesophageal reflux disease, on Protonix.  11.      Chronic venous stasis of the lower extremities.  12.      History of group B Strep bacteremia February 2005.   DISCHARGE MEDICATIONS:  Protonix 40 mg q.d.; albuterol nebulizers q.6h.  p.r.n.; Zoloft 100 mg q.d.; aspirin 81 mg q.d.; sliding scale insulin;  Altace 2.5 mg q.d.; Norvasc 10 mg q.d.; Ancef 2 grams IV q.8 times two  weeks.; Tylenol 650 mg p.o. q.4-6h. p.r.n. temperature greater than 38.5;  Xanax 0.5 mg p.o. q.8h. p.r.n.   DISPOSITION:  The patient is discharged to [facility] will she will  continue care for her ADLs and will require two weeks of IV antibiotics.  Post completion of antibiotics okay for PICC line to be removed.   CONSULTATIONS:  Dr. Janee Morn, general surgery, Dr. Orvan Falconer, infectious  disease.   PROCEDURES:  Removal of Port-A-Cath.   STUDIES:  Chest x-ray, cardiac enlargement and prominent mediastinum, low  volume exam, bibasilar  atelectasis, vascular congestion.  CT of the abdomen  and pelvis, right vascular atelectasis, post-surgical change, duodenum  diverticulum as suspected, soft tissue density at the midline at the  anterior abdominal musculature.  This may represent a scar.  If the patient  has a history of cancer, metastatic disease is not excluded.  CT of the  pelvis, tiny amount of fluid.  ABG on room air, pH 7.4, PCO2 of 38, PO2 107.  On admission, white count 16.4, hemoglobin 8.3, platelets 223.  At  discharge, white count 4.6, hemoglobin 7.1, platelets 352.  BMET at  discharge, creatinine 1.0, potassium 3.7, otherwise within normal limits.  TSH 2.1.   LABORATORY DATA:  Blood cultures showed Staphylococcus aureus -- methicillin  sensitive, 3 out of 4 bottles.  TEE negative for vegetation.   HISTORY OF PRESENT ILLNESS:  A 60 year old female with multiple medical  problems, who was  a resident at a nursing home.  She was admitted to the  hospital for abdominal pain and fever.  In the ED the patient had blood  cultures and CT of the abdomen and other screening blood work.  This  revealed significant leukocytosis.  Admission was deemed necessary for  further evaluation and treatment.  Please see the admission H&P for further  details.   PAST MEDICAL HISTORY:  As stated above, significant for  1. Morbid obesity.  2. Type 2 diabetes.  3. Depression.  4. Chronic stasis of her lower extremities.  5. Asthma.  6. History of group B strep bacteremia in February 2005.  7. Renal insufficiency.   MEDICATIONS ON ADMISSION:  1. Advair.  2. Zoloft.  3. Fentanyl.  4. Aspirin.  5. Protonix.  6. Sliding-scale insulin.  7. Albuterol p.r.n.   SOCIAL HISTORY:  Per H&P.   PAST SURGICAL HISTORY:  1. Cholecystectomy.  2. Total abdominal hysterectomy.  3. Skin grafts to the lower legs.   FAMILY HISTORY:  Noncontributory.   HOSPITAL COURSE:  PROBLEM #1 -- The patient was admitted to the medical  floor bed for  evaluation and treatment of fever and abdominal pain.  It was  ultimately determined that the patient had positive blood cultures for  Staphylococcus aureus, which is methicillin sensitive.  The patient  initially restarted on IV vancomycin; all three antibiotics switched to IV  Ancef.  The patient was seen in consultation by Surgery.  She had a Port-A-  Cath removed, as well as seen by consultation with Infectious Disease.  The  patient ultimately had a TEE which was negative for vegetations, per Dr.  Chales Abrahams.  It was ultimately determined that the patient will require two more  weeks of antibiotics; IV Ancef at that time planned.  The line can be  removed.  It was felt that the patient's source of infection was secondary  to her Port-A-Cath, which has been removed.   PROBLEM #2 -- ABDOMINAL PAIN.  The patient had a CT without any acute  findings.  No further studies indicated.   PROBLEM #3 -- ASTHMA/CHRONIC OBSTRUCTIVE PULMONARY DISEASE.  During her  hospitalization the patient had some problems with hypercarbia, which  resolved with removing most of her narcotics.  The patient was seen in  consultation by pulmonary.  It is suggested that the patient wear BiPAP;  however, the patient was adamant against it as the patient was stable  without it.  No further pursuit of this issue has been taken.  The patient  does require oxygen and is without problems.  If further problems with sleep  apnea arise, I recommend the patient have outpatient follow-up with Dr.  Corky Sing.   PROBLEM #4 -- DIABETES.  The patient will continue current outpatient  medications.   PROBLEM #5 -- HYPERTENSION.  The patient's BP was greater than optimal  during this hospitalization.  Medications were started (i.e., Norvasc and  Altace).  The patient will be required to have a BMET in approximately one week to make sure that she has tolerance of the ACE inhibitor.  The patient  will require further titration of  her medications on an outpatient basis.   PROBLEM #6 -- DEPRESSION.  Continue medications, as stated above.   DISPOSITION:  The patient has several other chronic medial problems which  are outlined above.  The patient will continue current outpatient  medications at this time.  The patient is medically stable for discharge.  Deirdre Peer. Polite, M.D.    RDP/MEDQ  D:  11/28/2003  T:  11/28/2003  Job:  [id number]

## 2010-11-19 NOTE — H&P (Signed)
NAME:  [name], [name]                      ACCOUNT NO.:  [id number]   MEDICAL RECORD NO.:  [id number]                   PATIENT TYPE:  INP   LOCATION:  A202                                 FACILITY:  [facility]   PHYSICIAN:  Melvyn Novas, MD        DATE OF BIRTH:  11/20/1950   DATE OF ADMISSION:  09/13/2003  DATE OF DISCHARGE:                                HISTORY & PHYSICAL   HISTORY OF PRESENT ILLNESS:  The patient is a 60 year old morbidly-obese  black female who was recently discharged 2 days prior to admission with  lower leg cellulitis, was at home, had an episode of emesis which was  followed by sharp chest pain in her left chest.  There was no anginal chest  pain.  No diaphoresis or nausea preceding this.  She presented to the  hospital at 4 a.m. basically admitting that she has an inability to care for  herself.  She is on multiple IV antibiotics, had recently had a Port-A-Cath  changed due to nonfunctioning status, and is admitted.  In addition her  problems are chronic anemia, hemoglobin of 8.9; and a chronically-elevated D-  dimer; CAT scan which was negative last week.  I do not clinically suspect  any pulmonary embolism.   PAST MEDICAL HISTORY:  Significant for cellulitis of left leg, morbid  obesity, type 2 diabetes, depression, asthma, GERD, chronic pain syndrome,  left subclavian Port-A-Cath recently changed, and she is allergic to  PENICILLIN.   CURRENT MEDICATIONS:  1. Clindamycin 900 t.i.d.  2. Levaquin 500 daily.  3. Vancomycin 1750 mg IV daily.  4. Zoloft 100 a day.  5. Glucophage XR 500 daily.  6. Advair Diskus q.12h.  7. Atrovent.  8. Proventil.  9. Aciphex.  10.      Aspirin.  11.      Duragesic 25 mcg q.72h.  12.      Imipramine 50 mg h.s.   LABORATORY DATA:  Significant abnormal labs reveal an elevated D-dimer of  2.46.  Hemoglobin of 8.9.  Albumin of 2.9.   SOCIAL HISTORY:  She lives alone, smokes one or two cigarettes a day.   PHYSICAL EXAMINATION:  VITAL SIGNS:  Temperature is 98.2, pulse is 89 and  regular, respiratory rate is 18, blood pressure 103/53.  HEENT:  Head:  Normocephalic, atraumatic.  Eyes:  PERRLA, extraocular  movements intact, sclerae clear, conjunctivae pale.  NECK:  Shows no jugular venous distention, no carotid bruits, no  thyromegaly, no thyroid bruits.  LUNGS:  Show diminished breath sounds at the bases.  No rales, wheezes, or  rhonchi appreciable.  HEART:  Regular rhythm.  No S3, S4, gallops.  No heaves, thrills or rubs.  No murmurs.  ABDOMEN:  Morbidly-obese, soft, nontender.  Bowel sounds active.  No  guarding, rebound, mass, or organomegaly.  EXTREMITIES:  Left leg:  Chronic eschar and ichthyosis and superimposed  cellulitis.   IMPRESSION:  1. Atypical chest pain, normal cardiac enzymes.  2. Chronically-elevated D-dimer.  3. Anemia, hemoglobin 8.9.  4. Cellulitis of the left leg.  5. Morbid obesity.  6. Diabetes.  7. Depression.  8. Inability to care for self alone.   PLAN:  Admit, continue with current antibiotics, get discharge planning  involved immediately for skilled nursing facility versus chronic home health  care, and will check serial cardiac enzymes, 1800 calorie diet, serial blood  sugars, and I will make further recommendations as the database expands.     ___________________________________________                                         Melvyn Novas, MD   RMD/MEDQ  D:  09/13/2003  T:  09/13/2003  Job:  [id number]

## 2010-11-19 NOTE — Discharge Summary (Signed)
NAME:  [name], [name]                      ACCOUNT NO.:  [id number]   MEDICAL RECORD NO.:  [id number]                   PATIENT TYPE:  INP   LOCATION:  4733                                 FACILITY:  [facility]   PHYSICIAN:  Elliot Cousin, M.D.                 DATE OF BIRTH:  Dec 25, 1950   DATE OF ADMISSION:  02/18/2004  DATE OF DISCHARGE:  02/23/2004                           DISCHARGE SUMMARY - REFERRING   DISCHARGE DIAGNOSES:  1. Left lower extremity ulcer with surrounding cellulitis.  2. Chronic abdominal pain/dyspepsia.  3. Chronic chest pain.  4. Normocytic anemia.  5. Type 2 diabetes mellitus.  6. Hypertension.   SECONDARY DISCHARGE DIAGNOSES:  1. Right lower extremity cellulitis in June of 2005.  The patient was noted     to have a negative deep venous thrombosis x 2 per venous Doppler     ultrasound  2. History of Methicillin-resistant Staphylococcus aureus bacteremia     secondary to Port-A-Cath, which was removed in June of 2005.  3. Chronic bilateral lower extremity venous stasis dermatitis with a history     of multiple skin grafts to the left lower extremity.  4. Probable chronic obstructive pulmonary disease/sleep apnea.  5. Morbid obesity.  6. Depression.  7. Gastroesophageal reflux disease.  8. History of group B streptococcus bacteremia in February of 2005.  9. Status post appendectomy in the past.  10.      Status post hysterectomy in the past.  11.      Status post carpal tunnel surgery in the past.   DISCHARGE MEDICATIONS:  1. Levaquin 750 mg each day for an additional five days.  2. Protonix 40 mg daily.  3. Norvasc 10 mg daily.  4. Altace 2.5 mg daily.  5. Aspirin 81 mg daily.  6. Advair 100/50 one inhalation b.i.d.  7. Zoloft 100 mg daily.  8. Percocet 5 mg one to two every four hours as needed for pain.  9. Xanax 0.5 mg t.i.d.  10.      Sliding scale insulin with regular insulin q.a.c. and q.h.s.  11.      Albuterol nebulizer q.6h. p.r.n.  12.       Vitamin C one tablet daily.  13.      Multivitamin with iron one tablet daily.  14.      Zinc sulfate 220 one daily.  15.      Nu-Iron one tablet daily.  16.      Colace 100 mg p.o. b.i.d.   DISCHARGE DISPOSITION:  The anticipated discharge will be on February 23, 2004.  The patient is much improved and in stable condition.  The patient  will be transferred back to a skilled nursing facility.  She has received  wound care as will be described in the summary.   PROCEDURE PERFORMED:  Right upper extremity PICC, which was placed February 19, 2004.  The patient should have the PICC removed  on the day of discharge.   CONSULTATIONS:  Wound care facilitator.   HISTORY OF PRESENT ILLNESS:  The patient is a 60 year old African American  lady with a past medical history significant for chronic venous stasis  ulcers of the lower extremities, morbid obesity, diabetes mellitus, who  presented to the emergency department on February 18, 2004 with an ulcer and  surrounding redness and pain of the left lower extremity.  The patient was  actually seen and treated in June of 2005 for a right lower extremity  cellulitis. The patient also complained of chest pain, which was dull and  constant and had been persistent for weeks.  The patient also complained of  upper abdominal pain with no associated nausea or vomiting.  The patient was  therefore admitted for further evaluation and management.   HOSPITAL COURSE:  1. LEFT LOWER EXTREMITY ULCER WITH SURROUNDING CELLULITIS:  The initial     management included starting the patient on Ancef 1 gram every six hours     empirically.  The patient had a lack of IV access and therefore the IV     team was consulted to place a PICC.  The PICC was placed the following     day.  The wound care nurse was also consulted for management of the left     leg wound site.  On the left lower extremity the lateral aspect, the     wound measured 7 X 8 cm, it was of partial  thickness, and there was     minimal exudate, which was non malodorous.  The wounds were treated with     Vaseline, gauze, which was to be changed to b.i.d.  The patient's legs     were elevated     and pressure was kept off of the wound.  The patient     has had ABIs performed in the past and from my recollection, the patient     has had a good distal blood flow.  The patient was my patient in     [facility] for approximately six months and I believe Dr. Mills Koller had performed the ABIs approximately two years ago.  The     nutritionist was also consulted for further evaluation regarding     nutrition for wound healing.  The registered dietician recommended a     multivitamin with iron, zinc sulfate and vitamin C.  The orders were     written for the vitamins and minerals as recommended.  The patient's     white blood cell count was slightly elevated at 12.4 on admission.  She     was afebrile on admission.  Over the course of the hospitalization, the     patient's white blood cell count normalized to 6.9.  She received five     days of intravenous Ancef.  She will be transitioned over to Levaquin 750     mg daily.  She will need an additional five days of Levaquin at 750 mg     daily.  It is very important that the patient's wounds be dressed as     stated above.  The most important aspect of wound healing will be for the     patient to keep pressure off of that left leg.  2. CHEST PAIN:  The patient has a history of chronic atypical chest pain.     She has ruled out for a myocardial infarction on quite  a few occasions in     the past, not only in [facility], but also in [place].  The     patient's chest pain may be secondary to morbid obesity versus     gastroesophageal reflux disease versus chronic anxiety.  Given her     history of obesity, certainly angina cannot be ruled out.  A 2D    echocardiogram was performed in May of 2005.  It revealed that the      patient had an ejection fraction of 55-65%.  There was no evidence of     left ventricular regional wall motion abnormalities.  There was, however,     a mild mitral valve prolapse involving the posterior leaflet.  It is also     possible that the mild mitral valve prolapse could be causing some     discomfort.  Cardiac enzymes were ordered during the hospital course and     again were negative x 3 sets.  The patient was treated as needed with     Percocet and Protonix 40 mg daily.  3. CHRONIC ABDOMINAL PAIN/DYSPEPSIA:  The patient has a long history of     chronic abdominal pain.  She has had two CT scans of the abdomen in the     past.  There were no acute abnormalities seen, however, there was a     mention of a 2.7 x 1.3 cm cystic structure anterior to the horizontal     portion of the duodenum, which may represent a duodenal diverticulum per     the CT scan of the abdomen and pelvis on Nov 17, 2003.  There was also     evidence of a soft tissue density at the midline at the anterior     abdominal musculature.  This has been stable over the past year.  This     area may represent a scar.  It is important to note that the patient has     not lost any weight, in fact, it appears that she is continuing to gain     weight.  Certainly the abdominal pain has not stopped her from eating.     As stated, she has not lost any weight.  The patient was treated     empirically with Protonix 40 mg daily and as needed Percocet.  It is     important to note the patient's urinalysis was essentially negative with     0-2 wbc's and 0-2 rbc's.  4. NORMOCYTIC ANEMIA:  The patient's hemoglobin ranges between 9.5 and 10.3.     The MCV ranges between 83 and 82.  The patient may need an outpatient     colonoscopy and upper endoscopy.  The patient's morbid obesity may be a     hinderance to these two procedures.  However, certainly it could be     arranged per the primary care physician at the nursing care  facility.     The patient was started on Nu-Iron once a day, and a multivitamin with     iron once a day.  Her ferritin was within normal limits at 238, however,     the total iron was low at 22 with a TIBC of 228 and a percent saturation     of 10%.  5. DIABETES MELLITUS/HYPERTENSION:  The patient's glycemic control was well     within normal limits during the hospital course.  She was placed on a  carbohydrate modified diet and covered with a sliding scale insulin     regimen q.a.c. and q.h.s.  The patient's blood pressures were well     controlled during the hospital course on Norvasc and Altace.                                                Elliot Cousin, M.D.    DF/MEDQ  D:  02/22/2004  T:  02/22/2004  Job:  [id number]

## 2010-11-19 NOTE — H&P (Signed)
NAME:  [name], [name]                      ACCOUNT NO.:  [id number]   MEDICAL RECORD NO.:  [id number]                   PATIENT TYPE:  INP   LOCATION:  1824                                 FACILITY:  [facility]   PHYSICIAN:  Burnice Logan, M.D.               DATE OF BIRTH:  08-04-1950   DATE OF ADMISSION:  02/18/2004  DATE OF DISCHARGE:                                HISTORY & PHYSICAL   CHIEF COMPLAINT:  Ulcer of the left lower extremity, chest and abdominal  pain of several weeks duration.   HISTORY OF PRESENT ILLNESS:  The patient is a 60 year old African American  lady who came to the emergency room  from the nursing home with the above  complaints.  She has chronic lower extremity ulcers and venous stasis.  She  was recently admitted to the hospital in June 2005 to [facility] with  cellulitis involving the lower extremities.  She was treated with a round of  IV antibiotics and discharged to the nursing home on Clindamycin.  Reviewing  her medical history, her cellulitis was predominantly right sided.  She  returns today, however, with break down of skin on her left lower extremity.  She has noticed discharge and increasing size of the wound over the last  week or so.  She has other complaints of chest pain which is dull and  constant and has been persistent for about a week.  She knows of no  aggravating or relieving factors.  She also complains of upper abdominal  pain with no associated nausea and vomiting or diarrhea.  The pain has no  relation to her meals.  She was seen in the emergency room  and was felt to  require admission for further evaluation of her symptoms.   PAST MEDICAL HISTORY:  1. Hypertension.  2. Morbid obesity.  3. Diabetes mellitus.  4. Staph wound infection.  5. Group B Streptococcal bacteremia in February 2005.  6. Presumed history of chronic obstructive pulmonary disease.  7. Suspected obstructive sleep apnea.  8. Gastroesophageal reflux  disease.  9. Multiple skin grafts to her left lower extremity.  10.      Status post appendectomy, hysterectomy, and carpal tunnel surgery.   ALLERGIES:  Penicillin.  She was treated with cephalosporin recently with no  adverse affects.   MEDICATIONS:  As per records from the nursing home:  Norvasc 10 mg, Altace  2.5 mg, aspirin 81 mg, Prilosec 20 mg, Advair 100/50 b.i.d., Zoloft 100 mg,  Percocet p.r.n., Xanax 0.5 mg t.i.d., and regular insulin sliding scale.   FAMILY HISTORY:  The patient's mother is alive and well at 6.  Her father  currently suffers from prostate cancer.   REVIEW OF SYMPTOMS:  GENERAL:  She has no fever.  NEUROLOGICAL:  Denies any  headaches, no history of seizures.  CARDIORESPIRATORY:  As per history of  present illness.  GASTROINTESTINAL:  As per history of present  illness.  GENITOURINARY:  No dysuria or hematuria.  MUSCULOSKELETAL:  Negative for  myalgias.  All other systems were, otherwise, negative.   PHYSICAL EXAMINATION:  GENERAL:  Middle aged, obese, African American lady in no acute distress.  VITAL SIGNS:  Temperature 99.4, blood pressure 119/69, heart rate 82,  respirations 16 to 18 per minute.  HEENT:  Normocephalic with no facial asymmetry, pupils are round and  symmetric, conjunctivae pink, sclerae clear, mucous membranes are moist,  throat unremarkable.  NECK:  Supple without lymph nodes or JVD.  CHEST:  Unlabored breathing.  LUNGS:  Clear to auscultation.  CARDIOVASCULAR:  Heart sounds 1 and 2 regular, no murmurs.  Apex not  displaced.  ABDOMEN:  Obese, soft, nontender, no masses felt, positive bowel sounds.  CNS:  Alert and oriented x 3.  Affect is flat.  No cranial nerve deficits on  exam.  No focal deficits identified.  EXTREMITIES:  She has evidence of previous skin graft surgery with chronic  dermatitis and venous stasis of the left lower extremity.  There is an 8 by  6 cm ulcer on the lateral aspect of the distal third of the left  lower  extremity.  Ulcer edges are slightly raised.  There is scant yellowish  discharge.  She has chronic skin changes on her right lower extremity with  no ulcers identified.   LABORATORY DATA:  White count 12.4, 79% neutrophils, hemoglobin 10,  hematocrit 30.6, platelet count 293.  Glucose 107, sodium 135, potassium  4.4, BUN 19, creatinine 1.2, CO2 25.  Cardiac markers are negative.  EKG  sinus rhythm at 81 per minute.   ASSESSMENT AND PLAN:  1. Cellulitis of the left lower extremity with ulceration.  The patient will     be admitted for treatment with antibiotics.  Wound cultures will be sent.     Because of difficulty in obtaining IV access, will administer     intramuscular Ancef.  The patient will have a PICC line placed for IV     administration of antibiotics by radiology in the morning.  Because of     the nonhealing nature of her ulcers and the fact that she is diabetic     with peripheral vascular disease, surgical evaluation may be appropriate.     She has been told in the past that she may require amputation.  Because     of the chronic nature of her venous stasis, this may become one of the     few options available for her.  2. Atypical chest pain and abdominal pain.  The description of her symptoms     is very atypical and doubtful for myocardial ischemia.  I suspect     probable gastroesophageal reflux disease.  I will continue her PPI.  She     will be monitored on telemetry until serial cardiac enzymes rule out any     cardiac component of her pain.  3. Depression, will continue her usual medications.  4. Normocytic anemia probably related to chronic illness.  Will check iron     indices.  5. DVT prophylaxis with low molecular weight heparin.                                                Burnice Logan, M.D.    ES/MEDQ  D:  02/18/2004  T:  02/18/2004  Job:  [id number]

## 2010-11-19 NOTE — Discharge Summary (Signed)
[name], [name] NO.:  [id number]   MEDICAL RECORD NO.:  [id number]          PATIENT TYPE:  INP   LOCATION:  3314                         FACILITY:  [facility]   PHYSICIAN:  Fransisco Hertz, M.D.  DATE OF BIRTH:  [birth year]   DATE OF ADMISSION:  11/12/2004  DATE OF DISCHARGE:  11/15/2004                                 DISCHARGE SUMMARY   TRANSFER SUMMARY   DISCHARGE DIAGNOSES:  1.  Right lower lobe pneumonia/chronic obstructive pulmonary disease      exacerbation.  2.  Chronic obstructive pulmonary disease.  3.  Lower extremity ulcers.  4.  Diabetes mellitus.  5.  Chronic normocytic anemia.  6.  Obesity.  7.  Hypertension.  8.  History of methicillin-sensitive Staphylococcus aureus septicemia      secondary to infected Port-A-Cath.  Port-A-Cath was placed secondary to      group G strep bacteremia from March 2005 through May 2005.  TEE was      negative for endocarditis at that time.  9.  Status post ventral hernia repair.  10. Status post cholecystectomy.  11. Status post multiple skin grafts to the lower extremities.   DISCHARGE MEDICATIONS:  1.  Aztreonam 1 g IV q.8h. x 8 days.  This will complete a 10-day course.  2.  Prednisone 80 mg p.o. daily x 1 day, then taper by 10 mg every day for      eight days.  3.  Albuterol nebulizer 2.5 mg inhaled q.6h.  4.  Atrovent nebulizers 0.5 mg inhaled every 6 hours.  5.  Altace 2.5 mg daily.  6.  Norvasc 10 mg p.o. daily.  7.  Protonix 40 mg p.o. daily.  8.  Risperdal 0.5 mg p.o. b.i.d.  9.  Aspirin 81 mg p.o. daily.  10. Ambien 10 mg p.o. q.h.s.  11. Zoloft 100 mg 1-1/2 tablets daily.  12. Multivitamin one p.o. daily.  13. Xanax 0.5 mg p.o. t.i.d.  14. Advair Diskus 100/50 b.i.d.  15. Lodine XL 500 mg p.o. daily.  16. Percocet 5/325 one to two tablets q.6h. as needed for pain.  17. Sliding scale insulin with Novolin as well as ferrous sulfate 325 mg      p.o. daily.   FOLLOW-UP ISSUES:  1.  Ms.  Kunath will need to continue IV aztreonam for eight days via her      PICC line.  The PICC line will need to be removed after she completes      this course.  She will also need to complete the prednisone taper as      listed above in the discharge medication section.  2.  Chronic normocytic anemia.  There is no evidence for bleeding and her      hemoglobin is stable throughout this admission.  Per her report, she has      had a colonoscopy in the last 10 years.  Multiple previous discharge      summaries mention anemia.  This should be addressed with consideration      of possible GI follow-up.   CONSULTATIONS DURING ADMISSION:  None.  PROCEDURES PERFORMED:  A portable chest x-ray on Nov 12, 2004, showed  cardiomegaly with right lower lobe pneumonia and possible right pleural  effusion.  Portable chest x-ray May 13 showed a PICC line at the right  subclavian vein, no pneumothorax.  Portable chest x-ray on Nov 13, 2004,  showed slightly improved right lower lobe pneumonia.  Decubitus chest x-ray  on May 13 showed no evidence of free flowing effusion.   BRIEF HISTORY AND PHYSICAL:  [name] is a 60 year old woman with a past  medical history as mentioned above who presented from [facility] to  the emergency department with a chief complaint of 2-3 days worsening  shortness of breath, a productive cough, and subjective fever and chills.  She had a chest x-ray that showed right lower lobe pneumonia.  On physical  examination, her temperature is 98.8, pulse 104, blood pressure 149/71,  respirations 32, O2 saturation 96% on 10 L.  She has a baseline oxygen  requirement of 2 L.  General:  She is pleasant and in no apparent distress.  Pupils are equal, round, and reactive to light and accommodation.  Extraocular movements intact.  Oropharynx is moist, no exudate.  Neck  supple.  No noticeable JVD.  Respirations:  She had decreased air movement  bilaterally, right greater than  left, with diffuse wheezes.  Cardiovascular:  Tachycardic with a 3/6 systolic ejection murmur.  Abdomen was soft and  nontender with positive bowel sounds.  Extremities:  1 to 2+ edema.  There  is an 8 x 9 cm ulcer in the left lower leg with good granulation.  Skin is  warm and dry.  Neurologic exam:  Alert and oriented x 3.  No focal  neurologic deficits.  Psychiatric exam:  She is appropriately interactive.   LABORATORIES ON ADMISSION:  Sodium 141, potassium 5.3 on hemolyzed specimen.  Chloride 96, bicarbonate 38, BUN 13, creatinine 0.8, glucose 118.  White  blood cell count 9.7, hemoglobin 9.1, platelets 253.  AST 31, ALT 21,  protein 7.9, albumin 3.6, calcium 9.4, total bilirubin 0.6.  Point of care  cardiac markers:  Troponin less than 0.05, MB 2.9, BNP 63, blood gas  7.37/88/55/51.  EKG showed sinus tachycardia with no ischemic changes.   HOSPITAL COURSE BY PROBLEM:  Problem 1. Pneumonia/chronic obstructive  pulmonary disease exacerbation.  Ms. Bungert was admitted and started on  intravenous steroids, continued on her nebulizers, and treated with  antibiotics.  Regarding her antibiotic regimen, she was considered at risk  for resistant gram negatives and gram positives given her status as a  nursing home resident.  Therefore, she was started on vancomycin in double  coverage for Pseudomonas including ciprofloxacin and aztreonam.  Of note,  she has a penicillin allergy which precluded the use of  piperacillin/tazobactam.  She tolerated the antibiotic therapy well.  Ciprofloxacin was discontinued on hospital day #3.  Culture data was  negative on day #3.  Therefore, she was transferred back to [facility]  reporting that she is back to her baseline respiratory status.  Of note, she  had several blood gases that showed quite an extensive degree of CO2  retention.  Given the elevated bicarbonate seen on corresponding BMETS, it is clear that this is a chronic problem of Ms.  Sistare.  She tolerated the  hypercapnia without any difficulties or changes in mental status.  She  should continue use of CPAP/BIPAP at night as tolerated.  As mentioned  above, she will continue aztreonam  for eight more days for a total of a 10-  day course.  This can be administered via her PICC line which will need to  be removed after finishing the IV course of antibiotics.  Prednisone will be  converted from an intravenous dose of 80 mg daily to an oral dose and taper  by 10 mg daily for the next eight days.  Chest x-rays did show some slight  improvement after only just one day.  There was no evidence of layering and  effusion on right lateral decubitus.   Problem 2. Anemia.  Ms. Song's hemoglobin is between 8 and 9.5  throughout the admission.  Upon talking to her and reviewing her records, it  is clear this is a chronic problem.  She does have a history of colonoscopy  per her report, but no evidence of this could be found in reviewing medical  records.  There was no clear source for bleeding during the admission, and  hemoglobin remained stable.  Therefore, consideration should be given to  outpatient evaluation, gastroenterology.  Relevant labs include a ferritin  of 42 and an MCV on admission of 82.   Problem 3. Lower extremity ulcers.  These are another chronic problem for  Ms. Wieseler.  They were evaluated and dressed throughout the admission with  standard wound care.  She has a history of multiple skin grafts.  She will  need to continue to have wound care at [facility].   Problem 4. Diabetes mellitus.  Ms. Catanese is on a regimen of Novolin  sliding scale insulin.  A hemoglobin A1C was checked and was 5.7 reflecting  excellent control.  Her regimen should be continued as it is clearly working  well.   Problem 5. Depression.  Ms. Preyer's mood seemed to be appropriate  throughout the admission.  She will continue Zoloft and Risperdal to be  adjusted per  her regular care Glema Takaki at [facility].   LABORATORIES AT THE TIME OF DISCHARGE:  WBC 8.5, hemoglobin 8.4, platelets  271, sodium 136, potassium 4.5, chloride 91, CO2 39, glucose 114, BUN 24,  creatinine 0.9.  Blood cultures negative x 2.  Ferritin 42, hemoglobin A1C  5.7.      BM/MEDQ  D:  11/15/2004  T:  11/15/2004  Job:  [id number]   cc:   [employer] [employer]

## 2010-11-19 NOTE — Consult Note (Signed)
NAME:  [name], [name]                      ACCOUNT NO.:  [id number]   MEDICAL RECORD NO.:  [id number]                   PATIENT TYPE:  INP   LOCATION:  2035                                 FACILITY:  [facility]   PHYSICIAN:  Gabrielle Dare. Janee Morn, M.D.             DATE OF BIRTH:  04/29/1951   DATE OF CONSULTATION:  11/17/2003  DATE OF DISCHARGE:                                   CONSULTATION   REASON FOR CONSULTATION:  Removal of Port-A-Cath and placement of central  line.   HISTORY OF PRESENT ILLNESS:  The patient is a 60 year old African-American  female with multiple medical problems who is a resident at a nursing home.  She was admitted for fever workup.  The patient had a Port-A-Cath placed in  the left chest earlier this year by Dr. Katrinka Blazing at [facility] for  _____________.  Current workup included both a CT scan of her abdomen and  pelvis which did not reveal an acute process, and also included blood  cultures which showed 3/4 bottles positive for Staph bacteremia;  one of  these cultures was indeed drawn from her Port-A-Cath.  I was asked to see  her in regards to removing her Port-A-Cath today and placement of central  line.  Currently, the patient claims her abdominal pain is better and she  has tolerated clear liquids.   PAST MEDICAL HISTORY:  1. Morbid obesity.  2. Type 2 diabetes.  3. Depression.  4. Chronic stasis ulcers of her left lower extremity complicated by     cellulitis at times.  5. GERD.  6. Asthma.  7. Adrenal insufficiency.   PAST SURGICAL HISTORY:  1. Port-A-Cath insertion x2.  2. Cholecystectomy.  3. Hysterectomy.  4. Split-thickness skin grafts, left lower extremity.  ___________________[place].   SOCIAL HISTORY:  She is a nursing home resident.  Does not drink alcohol or  smoke.   CURRENT MEDICATIONS:  1. Vancomycin.  2. Phenergan.  3. Ambien.  4. Senokot.  5. Flagyl.  6. Diflucan.  7. Protonix.  8. Zoloft.  9. Cipro.   ALLERGIES:   PENICILLIN.   REVIEW OF SYSTEMS:  GENERAL:  She is still feeling quite poorly.  RESPIRATORY:  No current complaints.  CARDIAC:  No current complaints.  GASTROINTESTINAL:  She is able to tolerate clears, and her abdominal pain is  better.  GENITOURINARY:  No complaints.  MUSCULOSKELETAL:  No complaints.  The remainder of the review of systems was unrevealing.   PHYSICAL EXAMINATION:  VITAL SIGNS:  Temperature 98.2, blood pressure  100/62, heart rate 86, respirations 20.  GENERAL:  She is awake and alert.  NECK:  Supple.  LUNGS:  Clear to auscultation with normal respiratory excursion.  She does  have significant obesity in her body habitus.  CARDIOVASCULAR:  Regular rate and rhythm.  PMI is palpable in the left  chest.  ABDOMEN:  Soft.  She has some old surgical scars.  No appreciable tenderness  is noted at this time.  In her left upper chest she does have a Port-A-Cath  in place which is accessed and dressed.  EXTREMITIES:  Left lower extremity has chronic stasis changes and is status  post some skin grafts.  There is no active cellulitis.   IMPRESSION AND PLAN:  Bacteremia with likely infected Port-A-Cath.  Check  her PT and PTT right away.  Plan to take to the operating room tonight for  removal of Port-A-Cath and placement of central line.  Procedure risks and  benefits were discussed with the patient who is agreeable to proceeding.  I  will contact her daughter.                                               Gabrielle Dare Janee Morn, M.D.    BET/MEDQ  D:  11/17/2003  T:  11/17/2003  Job:  [id number]   cc:   Dr. Rito Ehrlich

## 2010-11-19 NOTE — Procedures (Signed)
[name], [name]            ACCOUNT NO.:  [id number]   MEDICAL RECORD NO.:  [id number]          PATIENT TYPE:  OUT   LOCATION:  SLEEP CENTER                 [facility]ACILITY[facility]   PHYSICIAN:  Clinton D. Maple Hudson, M.D. DATE OF BIRTH:  05/09/1951   DATE OF STUDY:  12/01/2004                              NOCTURNAL POLYSOMNOGRAM   REFERRING PHYSICIAN:  Dr. Donnel Saxon   DATE OF STUDY:  Dec 01, 2004   INDICATION FOR STUDY:  Hypersomnia with sleep apnea.  Epworth Sleepiness  Score 12/24, BMI 66, weight 355 pounds.   SLEEP ARCHITECTURE:  Total sleep time 394 minutes with sleep efficiency 90%.  Stage I was 3%, stage II 46%, stages III and IV 47%, REM 4% of total sleep  time.  Sleep latency was 22 minutes, REM latency 270 minutes, awake after  sleep onset 19 minutes, arousal index 23.  Patient took any medications  before arrival.  Sleep architecture is unusual with early onset of sustained  stage III sleep raising a question of medication effect.  There was little  waking after sleep onset.   RESPIRATORY DATA:  Respiratory disturbance index (RDI, AHI) 4.3 obstructive  events per hour which is within the upper limits of normal (5 or less per  hour).  There was 1 central apnea, 11 obstructive apneas and 16 hypopneas.  Almost all sleep was supine.  REM RDI 28.2.   OXYGEN DATA:  Moderate snoring with oxygen desaturation to a nadir of 85%.  Mean oxygen saturation through the study was 95% with the study done on 2 L  of oxygen.   CARDIAC DATA:  Normal cardiac rhythm.   MOVEMENT/PARASOMNIA:  Occasional leg jerk with insignificant effect on  sleep.   IMPRESSION/RECOMMENDATION:  1.  Occasional sleep-disordered breathing events, respiratory disturbance      index 4.3 per hour (upper limit of normal is 5 per hour).  2.  Patient wore oxygen at 2 L/min through the study.  There was minimal      desaturation to 85% briefly with mean      oxygen saturation through the study 95% on 2 L.  3.   Normal cardiac rhythm.      CDY/MEDQ  D:  12/05/2004 12:19:21  T:  12/05/2004 19:35:28  Job:  [id number]

## 2010-11-19 NOTE — Consult Note (Signed)
NAME:  [name], [name]                      ACCOUNT NO.:  [id number]   MEDICAL RECORD NO.:  [id number]                   PATIENT TYPE:  EMS   LOCATION:  ED                                   FACILITY:  [facility]   PHYSICIAN:  Dirk Dress. Katrinka Blazing, M.D.                DATE OF BIRTH:  03-14-1951   DATE OF CONSULTATION:  DATE OF DISCHARGE:                                   CONSULTATION   EMERGENCY ROOM NOTE:   HISTORY OF PRESENT ILLNESS:  A 60 year old female with a history of fever  and myalgia for two to three days.  She states that she has had some sore  throat and cough with intermittent chills.  She has not had any diarrhea.  She developed some chest pain in her upper chest across the upper aspect of  her subclavicular area.  She did not have any precordial pain.  This has  gotten worse over the past three days.  She awakened this morning severely  agitated and anxious with some hallucinations.  She became extremely  agitated and afraid.  She decided that she would come to the emergency room.  She was brought to the emergency room by EMS.  At the time of her arrival  she was not in any acute distress.   PAST MEDICAL HISTORY:  Her past history reveals that she has chronic  diabetic ulcers of the left leg.  She has asthma.  She has a chronic anxiety  disorder and a history of chronic pain.  She has been medically disabled  since age 80.   PAST SURGICAL HISTORY:  Surgeries include cholecystectomy and hysterectomy.   MEDICATIONS:  1. Hemocyte daily.  2. Zoloft 50 mg daily.  3. Vicodin b.i.d.  4. Aciphex 20 mg daily  5. Imipramine 50 mg q.h.s.  6. Glucophage 500 mg with meals.  7. Enteric-coated aspirin one a day.  8. Many inhalers, probably albuterol p.r.n.   SOCIAL HISTORY:  She states that she does not drink, smoke, or use drugs.   FAMILY HISTORY:  She cannot give any family history.   PHYSICAL EXAMINATION:  GENERAL:  The patient is lying quietly.  She is not  in any acute  distress.  VITAL SIGNS:  Blood pressure 155/88, pulse 90, respirations 20, temperature  98.2.  HEENT:  Unremarkable except for poor dentition.  She has poor oropharyngeal  and facial hygiene.  NECK:  Supple.  No adenopathy or jugular venous distention.  CHEST:  Clear to auscultation.  No rales, rubs, rhonchi, or wheezes.  Chest  wall:  Nontender.  BREASTS:  Without masses.  No nipple indentation, no skin changes.  CARDIAC:  Regular rate and rhythm without murmur, gallop, or rub.  ABDOMEN:  Obese, soft, nontender, no masses.  Well-healed right subcostal  and lower midline incision.  Small midline supraumbilical incision.  She has  no organomegaly, and she has normal bowel sounds.  EXTREMITIES:  Chronic ulcerations with evidence of slow healing with some  granulation in the left leg.  The right leg is unremarkable.  NEUROLOGIC:  No focal changes.   LABORATORY DATA:  EKG shows normal sinus rhythm with no acute changes.  Chest x-ray shows no active disease.   CBC is normal.  Metabolic-12 is normal.  Her CK is 666 with an MB of 18;  however, her troponin I is less than 0.01.   DISCUSSION:  On close questioning to the patient, it is apparent that she  was on multiple medications that were not refilled by her physician.  She is  recently out of imipramine, Zoloft, and Vicodin that she had been taking for  quite some time.  She does not have any findings or history to suggest  cardiac problems.  I think that she is having some symptoms of withdrawal  and increased anxiety due to the abrupt cessation of her imipramine and  Zoloft.  The patient is closely counseled.  I am giving her medications  Vicodin 5/500 mg one b.i.d., 60 tablets; Zoloft 100 mg one daily, 30  tablets; and imipramine 50 mg one q.h.s., 30 tablets.  She is advised to  have follow-up with Dr. Sherrie Mustache or with me or Dr. Loleta Chance in the office next  week.   IMPRESSION:  1. Acute anxiety precipitated by abrupt cessation of  imipramine and Zoloft.  2. Chronic pain syndrome with inadequate pain control.  3. Diabetes mellitus, stable.  4. Chronic diabetic leg ulcers, stable.   RECOMMENDATIONS:  The patient is discharged from the emergency room in  stable condition.  She will take her medications as ordered, and I will  follow her up in the office next week.                                                 Dirk Dress. Katrinka Blazing, M.D.    LCS/MEDQ  D:  09/20/2002  T:  09/20/2002  Job:  [id number]

## 2011-01-24 ENCOUNTER — Other Ambulatory Visit: Payer: Self-pay | Admitting: Internal Medicine

## 2011-01-24 DIAGNOSIS — Z1231 Encounter for screening mammogram for malignant neoplasm of breast: Secondary | ICD-10-CM

## 2011-02-01 ENCOUNTER — Ambulatory Visit
Admission: RE | Admit: 2011-02-01 | Discharge: 2011-02-01 | Disposition: A | Discharge status: Home / Self Care | Payer: Medicaid Other | Source: Ambulatory Visit | Attending: Internal Medicine | Admitting: Internal Medicine

## 2011-02-01 DIAGNOSIS — Z1231 Encounter for screening mammogram for malignant neoplasm of breast: Secondary | ICD-10-CM

## 2011-03-01 IMAGING — CR DG THORACIC SPINE 2V
3 series · 3 of 3 positions shown · non-contrast
Comparison: 01/06/2009

CLINICAL DATA: Fall from wheelchair.  Back pain.  Weakness.

THORACIC SPINE - 2 VIEW

[t t-spine a.p.]
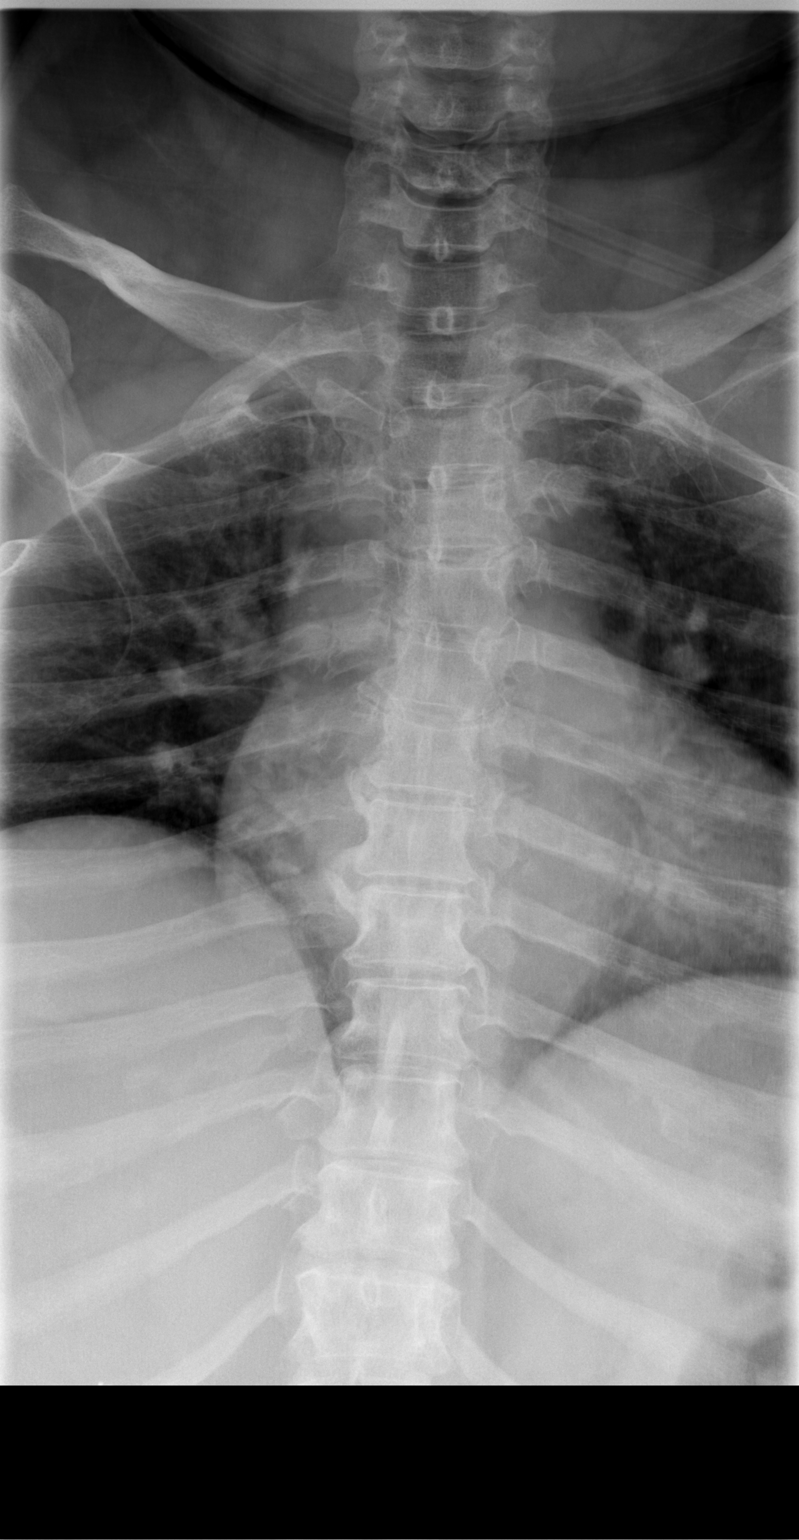

[t t-spine lat (1 of 2)]
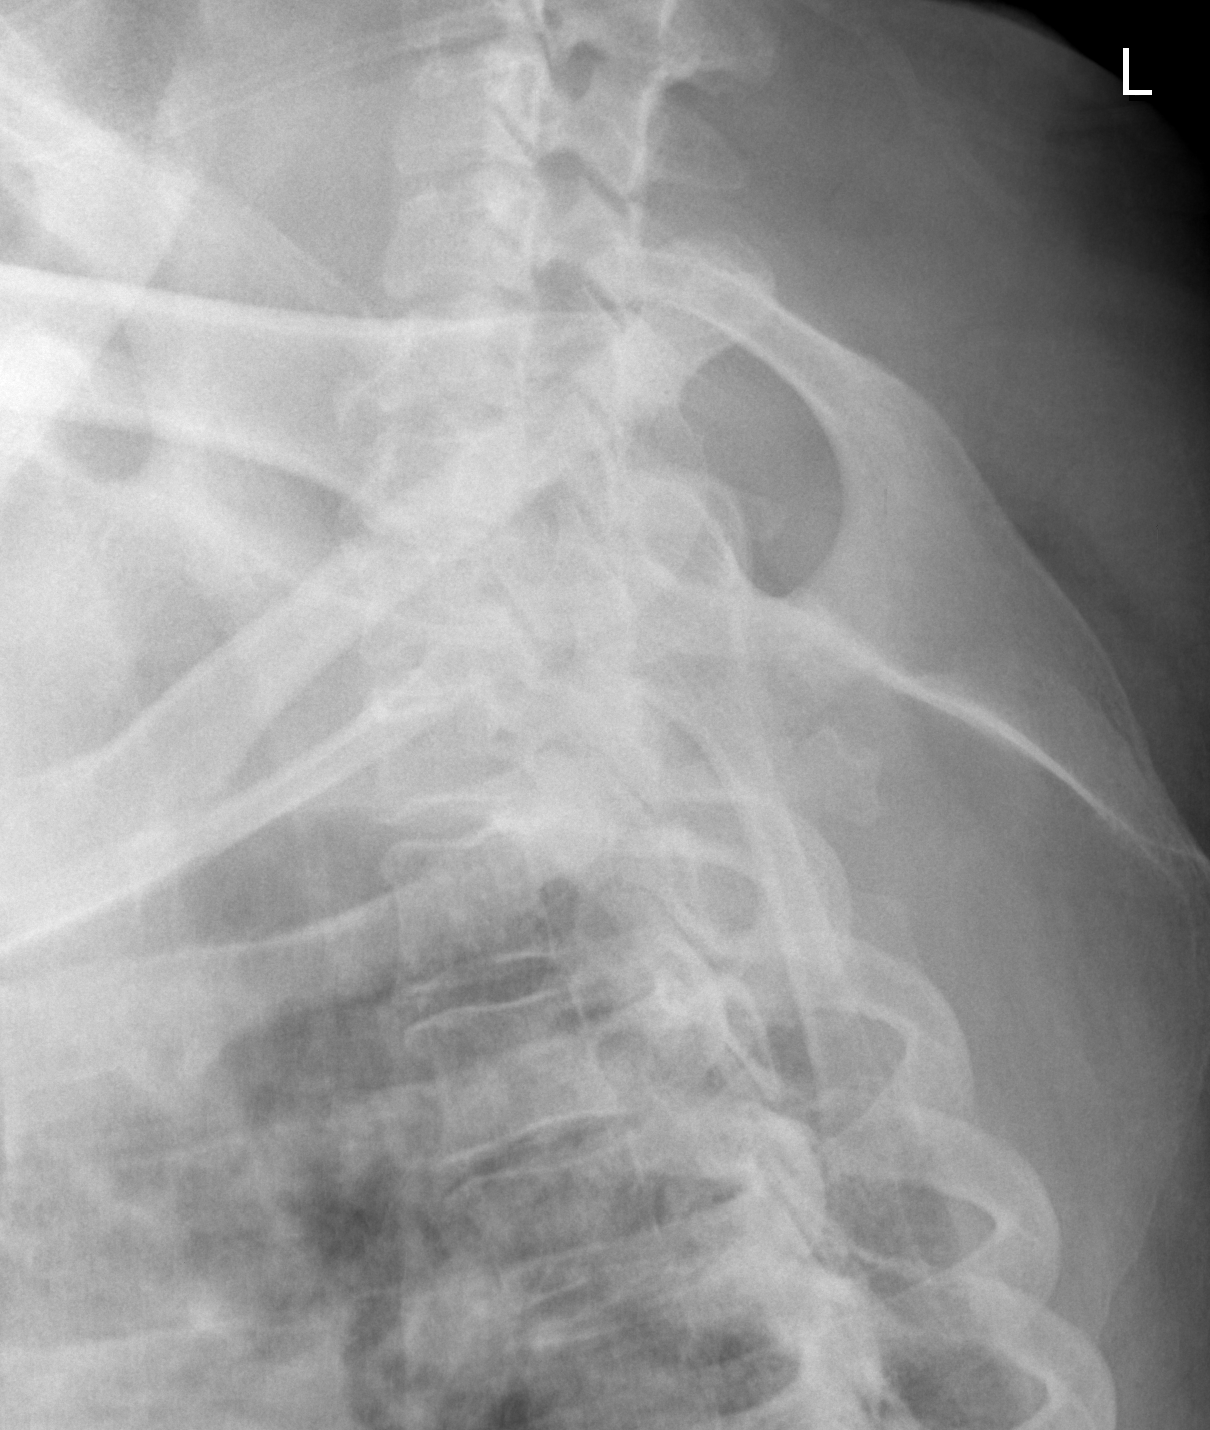

[t t-spine lat (2 of 2)]
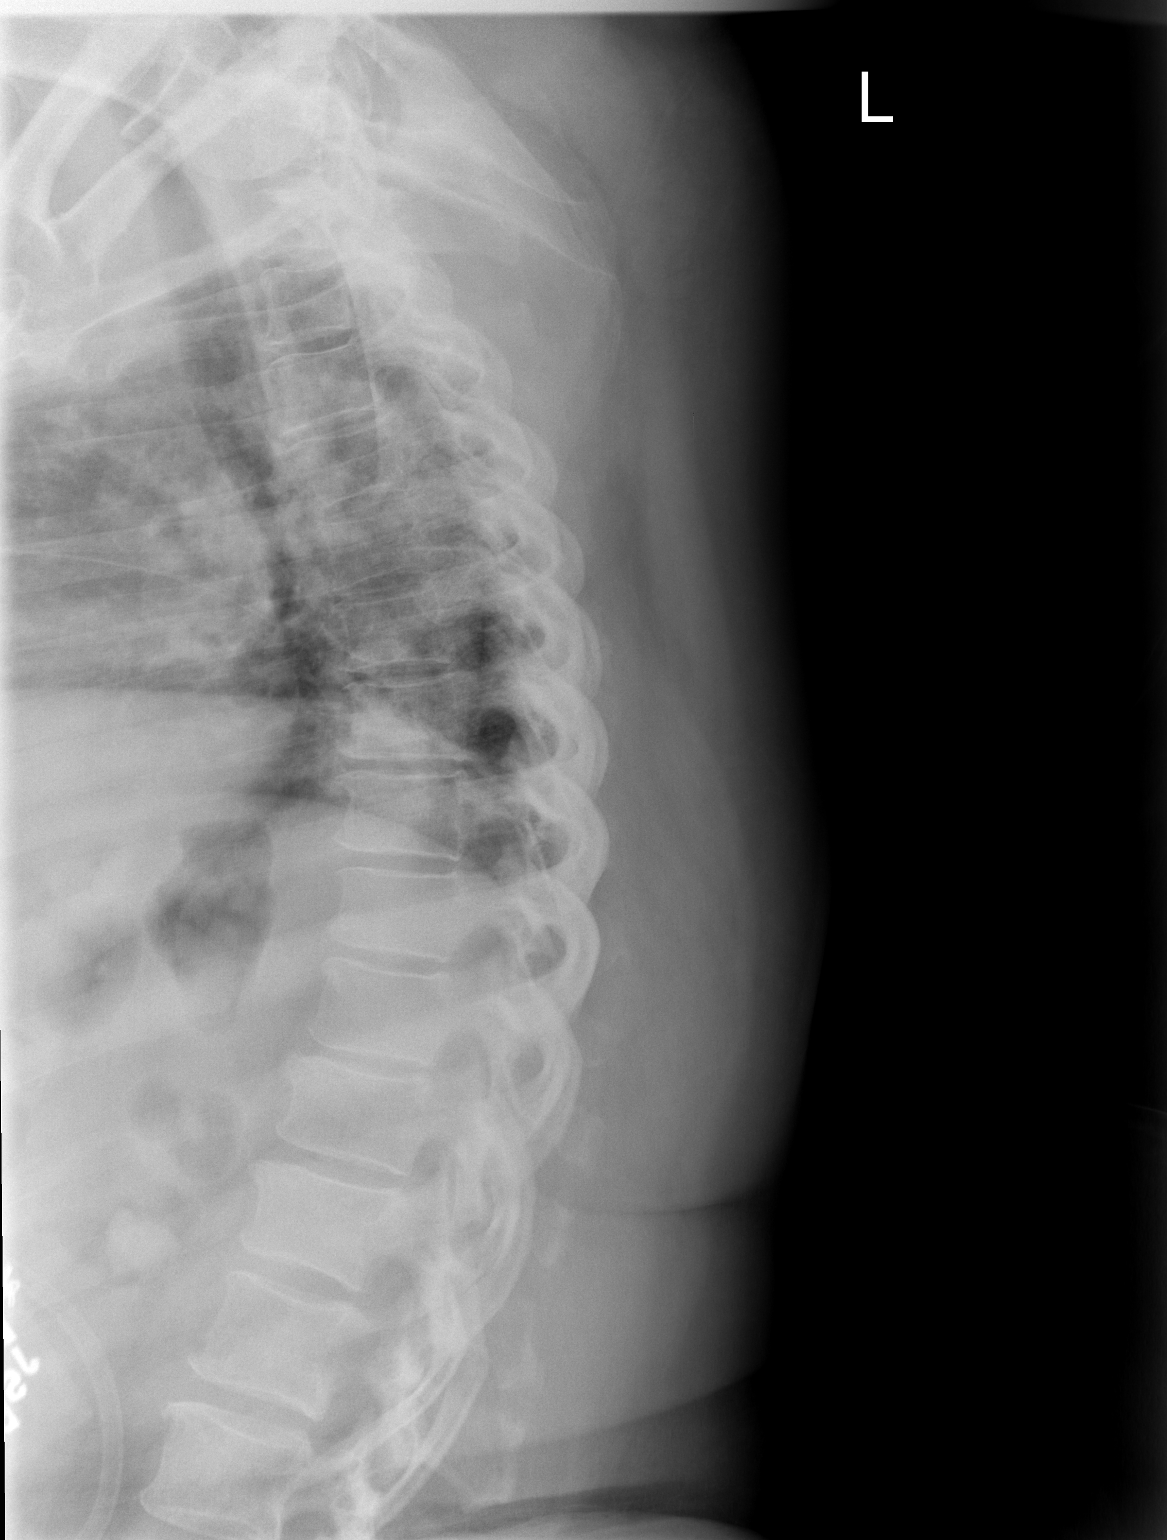

[3 of 3 positions shown; findings below may reference images not displayed]

FINDINGS: Thoracic spondylosis noted.  No fracture or acute
subluxation identified.
IMPRESSION: 1.  No acute thoracic spine findings.
2.  Thoracic spondylosis.

## 2011-03-01 IMAGING — CT CT CERVICAL SPINE W/O CM
2 of 7 series · 8 of 28 positions shown, 9 images · non-contrast
Comparison: Head CT on 03/30/2005

CT HEAD

CLINICAL DATA: Fall.  Headache.  Neck pain.

CT HEAD WITHOUT CONTRAST
CT CERVICAL SPINE WITHOUT CONTRAST
TECHNIQUE: Multidetector CT imaging of the head and cervical spine
was performed following the standard protocol without intravenous
contrast.  Multiplanar CT image reconstructions of the cervical
spine were also generated.

[Series 4: cervical spine · axial · 0.30mm/px · z∈[-21,+147]mm · 3 of 68 slices shown, 4 images]
[im 1/68  soft-tissue]
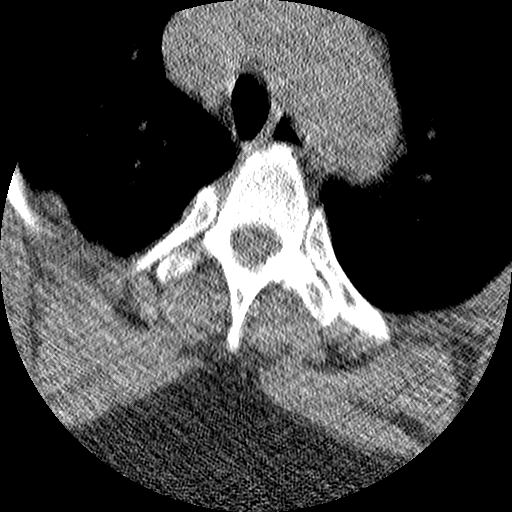
[im 1/68  bone]
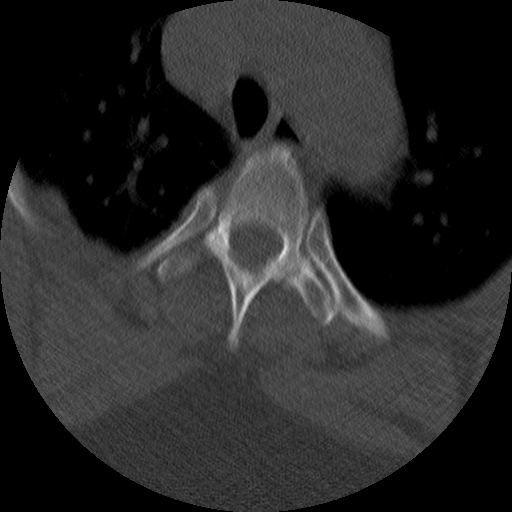
[im 34/68  bone]
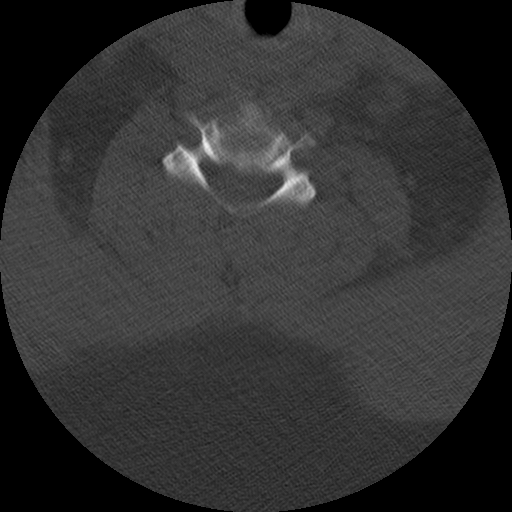
[im 68/68  bone]
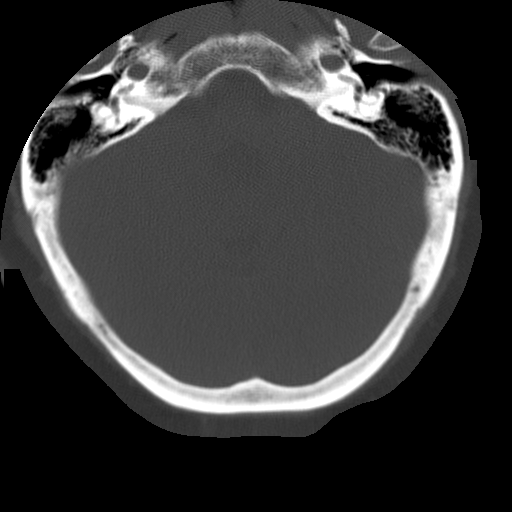

[Series 602: sag · sagittal · 0.34mm/px · 5 of 75 slices shown]
[im 13/75  bone]
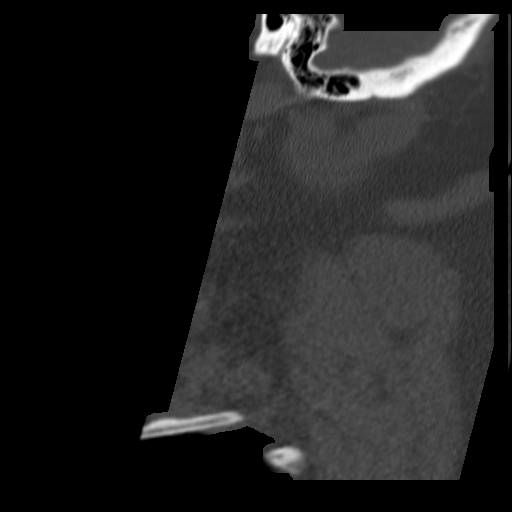
[im 25/75  bone]
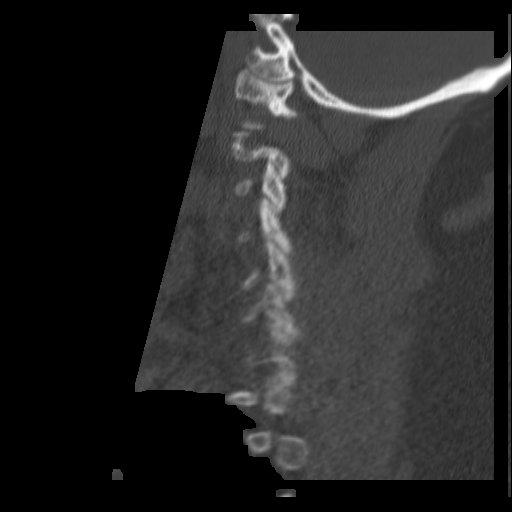
[im 38/75  bone]
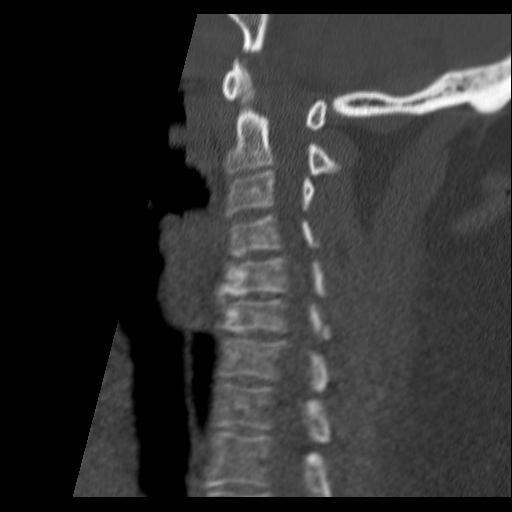
[im 50/75  bone]
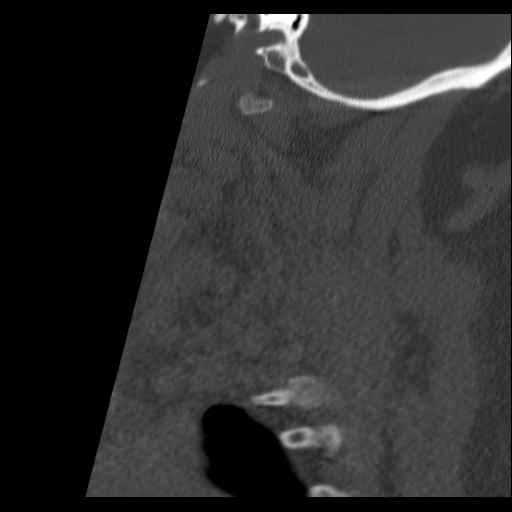
[im 62/75  bone]
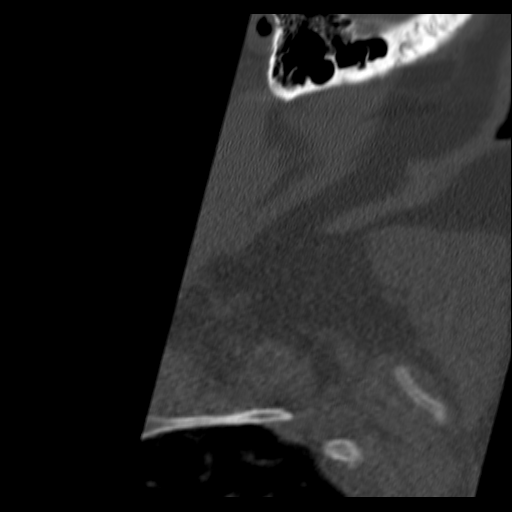

[8 of 28 positions shown; findings below may reference images not displayed]

FINDINGS: There is no evidence of intracranial hemorrhage, brain
edema or other signs of acute infarction.  There is no evidence of
intracranial mass lesion or mass effect.  No abnormal extra-axial
fluid collections are identified.

Mild chronic small vessel disease again noted.  Old right caudate
lacuna is again seen, with mild ex vacuo dilatation of the right
frontal horn.  No evidence of hydrocephalus.  No skull fracture
identified.
IMPRESSION: 1.  No acute intracranial abnormality.
2.  Stable chronic small vessel disease and old right caudate
lacune.

CT CERVICAL SPINE
FINDINGS: No evidence of cervical spine fracture or subluxation.
Mild degenerative disc disease is seen at the C5-6 and C6-7.  Mild
degenerative changes also seen at C1-2.  No other significant bone
abnormality identified.
IMPRESSION: 1.  No evidence of cervical spine fracture or subluxation.
2.  Mild degenerative cervical spondylosis, as described above.

## 2011-03-14 ENCOUNTER — Emergency Department (HOSPITAL_COMMUNITY)
Admission: EM | Admit: 2011-03-14 | Discharge: 2011-03-15 | Disposition: A | Discharge status: Home / Self Care | Payer: Medicaid Other | Attending: Emergency Medicine | Admitting: Emergency Medicine

## 2011-03-14 ENCOUNTER — Emergency Department (HOSPITAL_COMMUNITY): Payer: Medicaid Other

## 2011-03-14 DIAGNOSIS — G4733 Obstructive sleep apnea (adult) (pediatric): Secondary | ICD-10-CM | POA: Insufficient documentation

## 2011-03-14 DIAGNOSIS — K219 Gastro-esophageal reflux disease without esophagitis: Secondary | ICD-10-CM | POA: Insufficient documentation

## 2011-03-14 DIAGNOSIS — M79609 Pain in unspecified limb: Secondary | ICD-10-CM | POA: Insufficient documentation

## 2011-03-14 DIAGNOSIS — J4489 Other specified chronic obstructive pulmonary disease: Secondary | ICD-10-CM | POA: Insufficient documentation

## 2011-03-14 DIAGNOSIS — S8010XA Contusion of unspecified lower leg, initial encounter: Secondary | ICD-10-CM | POA: Insufficient documentation

## 2011-03-14 DIAGNOSIS — I10 Essential (primary) hypertension: Secondary | ICD-10-CM | POA: Insufficient documentation

## 2011-03-14 DIAGNOSIS — IMO0002 Reserved for concepts with insufficient information to code with codable children: Secondary | ICD-10-CM | POA: Insufficient documentation

## 2011-03-14 DIAGNOSIS — I739 Peripheral vascular disease, unspecified: Secondary | ICD-10-CM | POA: Insufficient documentation

## 2011-03-14 DIAGNOSIS — E119 Type 2 diabetes mellitus without complications: Secondary | ICD-10-CM | POA: Insufficient documentation

## 2011-03-14 DIAGNOSIS — F319 Bipolar disorder, unspecified: Secondary | ICD-10-CM | POA: Insufficient documentation

## 2011-03-14 DIAGNOSIS — J449 Chronic obstructive pulmonary disease, unspecified: Secondary | ICD-10-CM | POA: Insufficient documentation

## 2011-03-24 LAB — CBC
HCT: 29.4 — ABNORMAL LOW
HCT: 31.9 — ABNORMAL LOW
HCT: 32.4 — ABNORMAL LOW
HCT: 34.3 — ABNORMAL LOW
HCT: 34.6 — ABNORMAL LOW
HCT: 34.6 — ABNORMAL LOW
HCT: 34.6 — ABNORMAL LOW
Hemoglobin: 10.1 — ABNORMAL LOW
Hemoglobin: 11 — ABNORMAL LOW
Hemoglobin: 11.3 — ABNORMAL LOW
Hemoglobin: 11.6 — ABNORMAL LOW
Hemoglobin: 11.6 — ABNORMAL LOW
Hemoglobin: 11.6 — ABNORMAL LOW
Hemoglobin: 11.7 — ABNORMAL LOW
Hemoglobin: 12
MCHC: 33.5
MCHC: 33.7
MCHC: 33.8
MCHC: 33.9
MCHC: 33.9
MCHC: 34
MCV: 90.8
MCV: 90.9
MCV: 91.1
MCV: 91.1
MCV: 91.2
MCV: 91.4
Platelets: 162
Platelets: 195
Platelets: 255
Platelets: 261
Platelets: 263
RBC: 3.5 — ABNORMAL LOW
RBC: 3.56 — ABNORMAL LOW
RBC: 3.82 — ABNORMAL LOW
RBC: 3.86 — ABNORMAL LOW
RDW: 13.8
RDW: 14.6
WBC: 10.3
WBC: 10.7 — ABNORMAL HIGH
WBC: 11.4 — ABNORMAL HIGH
WBC: 12.9 — ABNORMAL HIGH
WBC: 7.8

## 2011-03-24 LAB — COMPREHENSIVE METABOLIC PANEL
ALT: 28
ALT: 55 — ABNORMAL HIGH
Albumin: 3 — ABNORMAL LOW
Alkaline Phosphatase: 52
Alkaline Phosphatase: 54
BUN: 16
BUN: 18
CO2: 38 — ABNORMAL HIGH
Calcium: 8.3 — ABNORMAL LOW
Calcium: 9.2
GFR calc non Af Amer: >60
Glucose, Bld: 107 — ABNORMAL HIGH
Glucose, Bld: 128 — ABNORMAL HIGH
Potassium: 3.8
Sodium: 141
Sodium: 142
Total Protein: 5.8 — ABNORMAL LOW

## 2011-03-24 LAB — URINALYSIS, ROUTINE W REFLEX MICROSCOPIC
Glucose, UA: NEGATIVE
Ketones, ur: NEGATIVE
Nitrite: POSITIVE — AB
Protein, ur: NEGATIVE
Urobilinogen, UA: 0.2

## 2011-03-24 LAB — BLOOD GAS, ARTERIAL
Acid-Base Excess: 11.4 — ABNORMAL HIGH
Bicarbonate: 34 — ABNORMAL HIGH
Bicarbonate: 36.6 — ABNORMAL HIGH
Delivery systems: POSITIVE
O2 Saturation: 93.2
O2 Saturation: 96.6
pCO2 arterial: 65.2
pH, Arterial: 7.337 — ABNORMAL LOW
pO2, Arterial: 70.5 — ABNORMAL LOW
pO2, Arterial: 83.6

## 2011-03-24 LAB — T3, FREE: T3, Free: 1.7 — ABNORMAL LOW (ref 2.3–4.2)

## 2011-03-24 LAB — URINE CULTURE: Colony Count: >=100000

## 2011-03-24 LAB — CLOSTRIDIUM DIFFICILE EIA: C difficile Toxins A+B, EIA: NEGATIVE

## 2011-03-24 LAB — TSH: TSH: 1.261

## 2011-03-24 LAB — URINE MICROSCOPIC-ADD ON

## 2011-03-24 LAB — T4, FREE: Free T4: 1.17

## 2011-03-25 LAB — POCT I-STAT CREATININE
Creatinine, Ser: 1.3 — ABNORMAL HIGH
Operator id: 192351

## 2011-03-25 LAB — I-STAT 8, (EC8 V) (CONVERTED LAB)
BUN: 14
Bicarbonate: 39.7 — ABNORMAL HIGH
Glucose, Bld: 100 — ABNORMAL HIGH
HCT: 37
Operator id: 192351
pCO2, Ven: 60.8 — ABNORMAL HIGH
pH, Ven: 7.423 — ABNORMAL HIGH

## 2011-03-25 LAB — CBC
Hemoglobin: 11.6 — ABNORMAL LOW
MCHC: 33.5
MCV: 92.7
RDW: 15.1

## 2011-03-25 LAB — POCT CARDIAC MARKERS
CKMB, poc: 2.9
Myoglobin, poc: 185
Troponin i, poc: <0.05

## 2011-04-01 LAB — CBC
HCT: 34.8 — ABNORMAL LOW
MCV: 90.6
RBC: 3.85 — ABNORMAL LOW
WBC: 8.5

## 2011-04-01 LAB — URINALYSIS, ROUTINE W REFLEX MICROSCOPIC
Bilirubin Urine: NEGATIVE
Protein, ur: NEGATIVE
Urobilinogen, UA: 0.2

## 2011-04-01 LAB — URINE MICROSCOPIC-ADD ON

## 2011-04-01 LAB — DIFFERENTIAL
Eosinophils Absolute: 0.1
Eosinophils Relative: 1
Lymphs Abs: 1.9
Monocytes Absolute: 0.6
Monocytes Relative: 7

## 2011-04-01 LAB — BASIC METABOLIC PANEL
Chloride: 96
GFR calc Af Amer: >60
Potassium: 4.5

## 2011-04-08 LAB — DIFFERENTIAL
Basophils Absolute: 0
Eosinophils Absolute: 0
Eosinophils Relative: 0
Lymphocytes Relative: 24
Lymphocytes Relative: 3 — ABNORMAL LOW
Lymphs Abs: 0.3 — ABNORMAL LOW
Monocytes Absolute: 0.1
Monocytes Absolute: 0.5
Neutro Abs: 5.5

## 2011-04-08 LAB — BLOOD GAS, ARTERIAL
Acid-Base Excess: 7.5 — ABNORMAL HIGH
Acid-Base Excess: 7.5 — ABNORMAL HIGH
Bicarbonate: 35.4 — ABNORMAL HIGH
Bicarbonate: 35.8 — ABNORMAL HIGH
Delivery systems: POSITIVE
Delivery systems: POSITIVE
Drawn by: 295541
FIO2: 0.4
FIO2: 0.45
O2 Saturation: 97.4
Patient temperature: 98.4
Patient temperature: 98.6
Patient temperature: 98.6
RATE: 12
TCO2: 33
TCO2: 33.4
pCO2 arterial: 83
pH, Arterial: 7.253 — ABNORMAL LOW
pH, Arterial: 7.296 — ABNORMAL LOW
pO2, Arterial: 289 — ABNORMAL HIGH

## 2011-04-08 LAB — CBC
HCT: 33 — ABNORMAL LOW
MCV: 91.9
Platelets: 177
Platelets: 210
RBC: 3.81 — ABNORMAL LOW
RDW: 13.6
WBC: 9.1

## 2011-04-08 LAB — COMPREHENSIVE METABOLIC PANEL
ALT: 23
AST: 35
Albumin: 3.6
Albumin: 3.6
BUN: 15
CO2: 34 — ABNORMAL HIGH
Calcium: 8.7
Chloride: 94 — ABNORMAL LOW
Chloride: 96
Creatinine, Ser: 1.06
GFR calc Af Amer: >60
GFR calc non Af Amer: >60
Sodium: 140
Total Bilirubin: 0.6
Total Bilirubin: 0.8

## 2011-04-08 LAB — POCT I-STAT 3, ART BLOOD GAS (G3+)
Operator id: 135691
pCO2 arterial: 71.1
pH, Arterial: 7.416 — ABNORMAL HIGH
pO2, Arterial: 74 — ABNORMAL LOW

## 2011-04-08 LAB — URINALYSIS, ROUTINE W REFLEX MICROSCOPIC
Bilirubin Urine: NEGATIVE
Bilirubin Urine: NEGATIVE
Glucose, UA: NEGATIVE
Hgb urine dipstick: NEGATIVE
Ketones, ur: NEGATIVE
Nitrite: NEGATIVE
Nitrite: NEGATIVE
Specific Gravity, Urine: 1.028
Specific Gravity, Urine: 1.028
pH: 5.5
pH: 7.5

## 2011-04-08 LAB — CLOSTRIDIUM DIFFICILE EIA: C difficile Toxins A+B, EIA: NEGATIVE

## 2011-04-08 LAB — URINE MICROSCOPIC-ADD ON

## 2011-04-08 LAB — D-DIMER, QUANTITATIVE
D-Dimer, Quant: 0.47
D-Dimer, Quant: 1.09 — ABNORMAL HIGH

## 2011-04-08 LAB — APTT: aPTT: 29

## 2011-04-08 LAB — LIPASE, BLOOD: Lipase: 15

## 2011-04-08 LAB — LACTIC ACID, PLASMA: Lactic Acid, Venous: 1.1

## 2011-04-10 IMAGING — CR DG CHEST 2V
1 series · 1 of 1 positions shown · non-contrast
Comparison: 10/06/2009

CLINICAL DATA: Fever, nausea, vomiting

CHEST - 2 VIEW

[view not recorded]
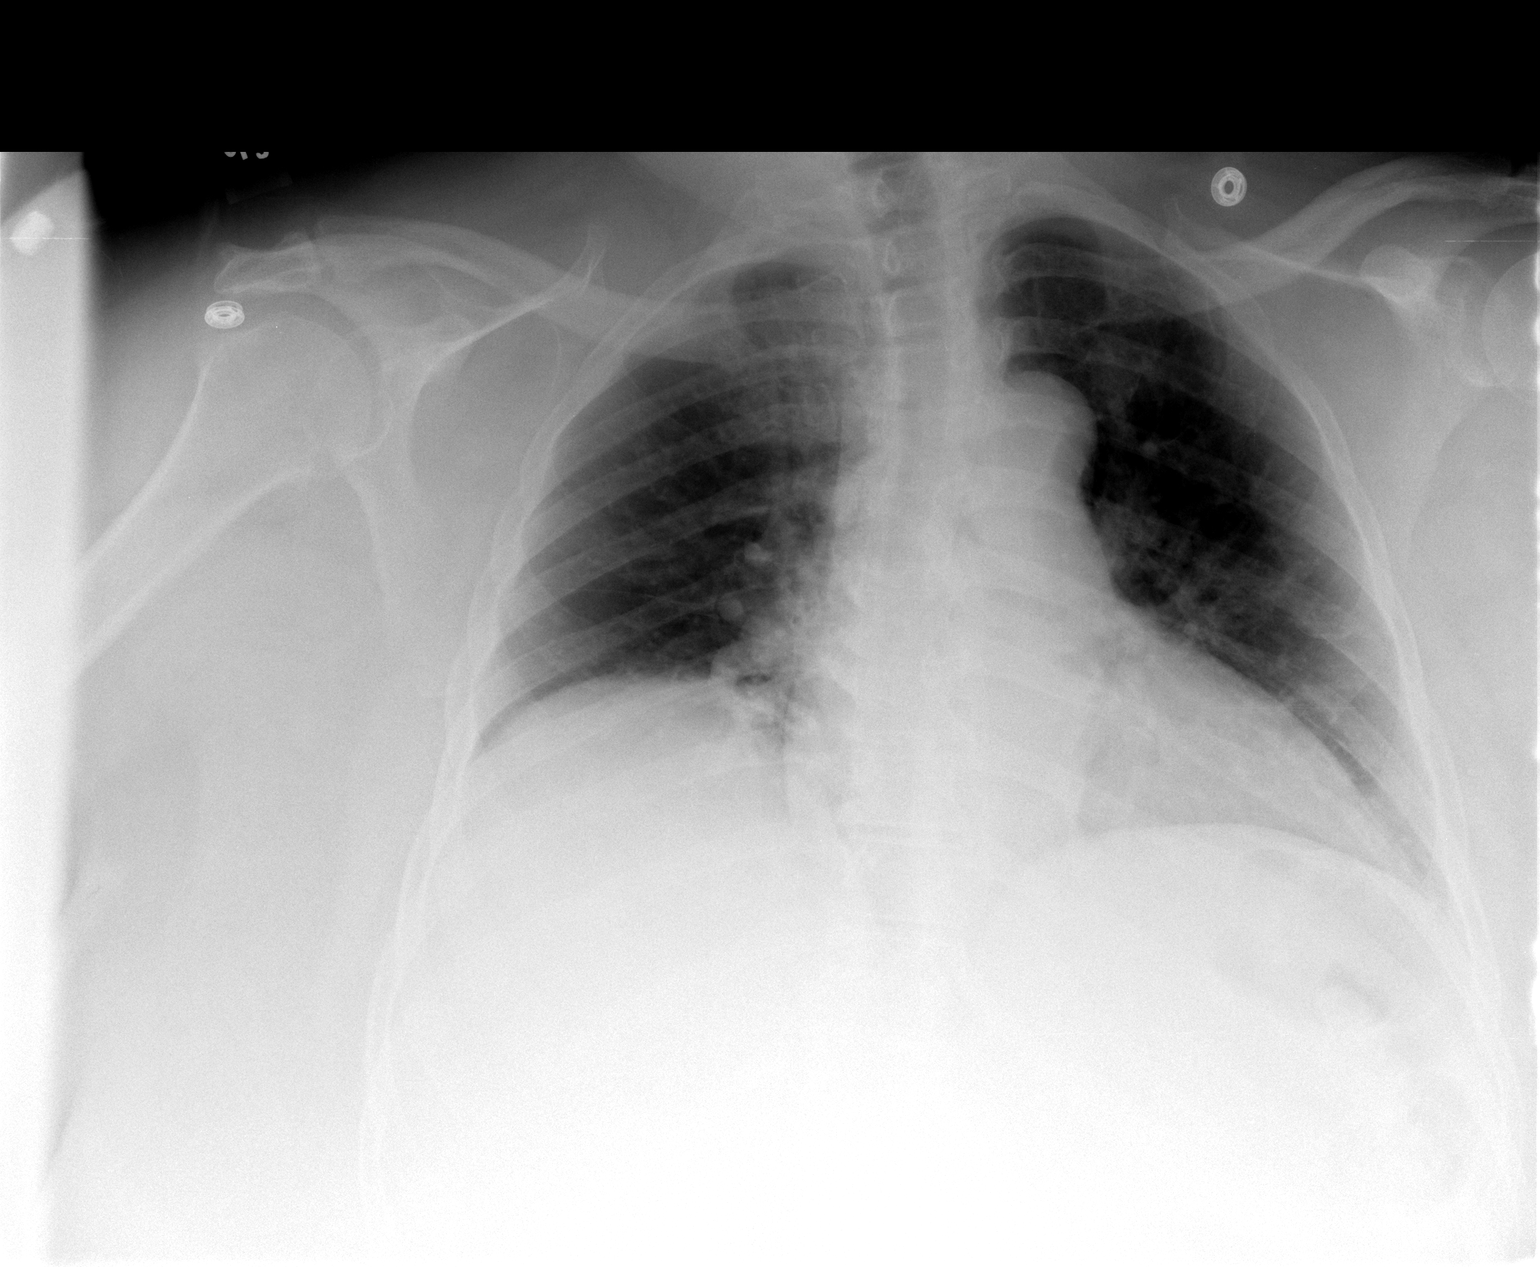

[1 of 1 positions shown; findings below may reference images not displayed]

FINDINGS: Stable prominent heart size and vascularity.  Right
hemidiaphragm remains elevated.  Basilar atelectasis evident.  No
definite pneumonia, edema, effusion or pneumothorax.
IMPRESSION: Stable chest exam.  No superimposed acute process

## 2011-04-10 IMAGING — CT CT CERVICAL SPINE W/O CM
3 of 4 series · 16 of 33 positions shown, 19 images · non-contrast
Comparison: Head and cervical spine CT scan 02/21/2010.

CT HEAD

CLINICAL DATA: Status post fall.

CT HEAD WITHOUT CONTRAST
CT CERVICAL SPINE WITHOUT CONTRAST
TECHNIQUE: Multidetector CT imaging of the head and cervical spine
was performed following the standard protocol without intravenous
contrast.  Multiplanar CT image reconstructions of the cervical
spine were also generated.

[Series 7: soft tissue · axial · 0.34mm/px · z∈[+111,+251]mm · 8 of 90 slices shown, 10 images]
[im 10/90  soft-tissue]
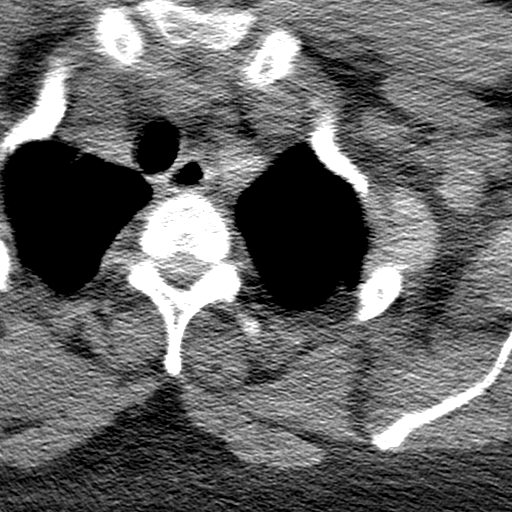
[im 10/90  bone]
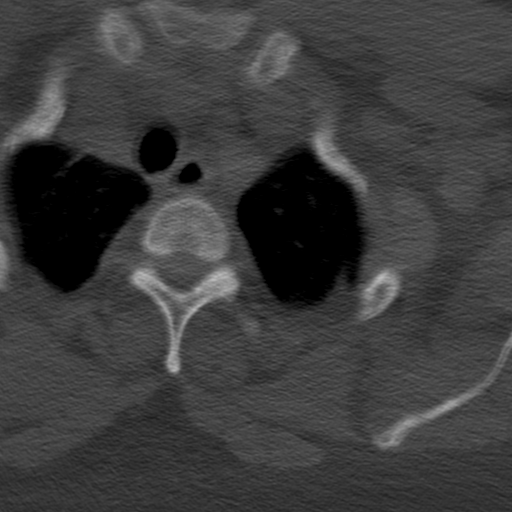
[im 20/90  bone]
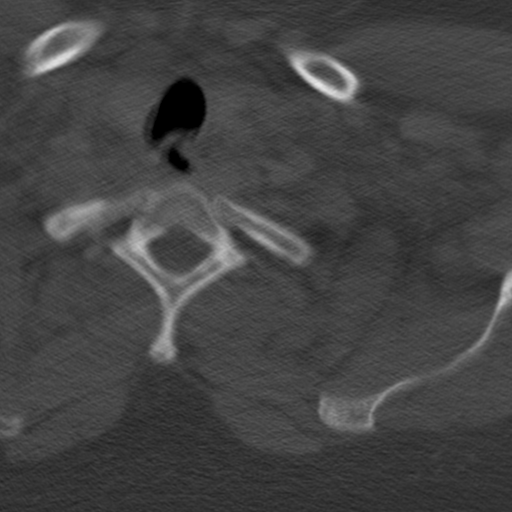
[im 30/90  bone]
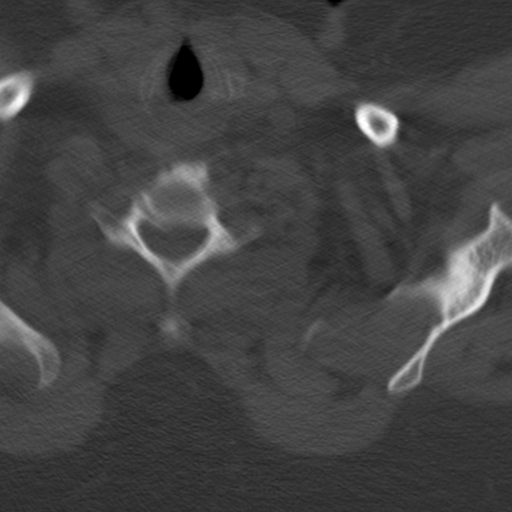
[im 40/90  bone]
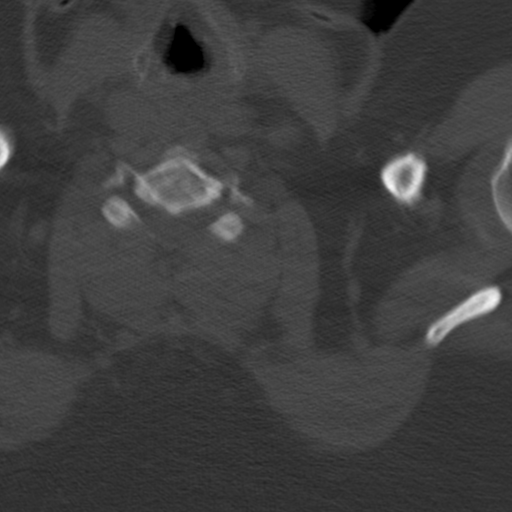
[im 50/90  soft-tissue]
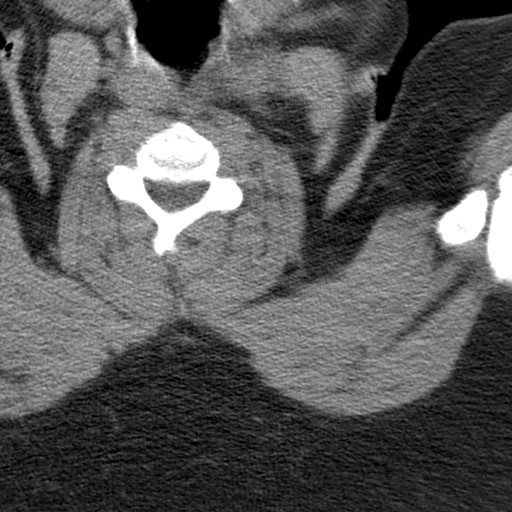
[im 50/90  bone]
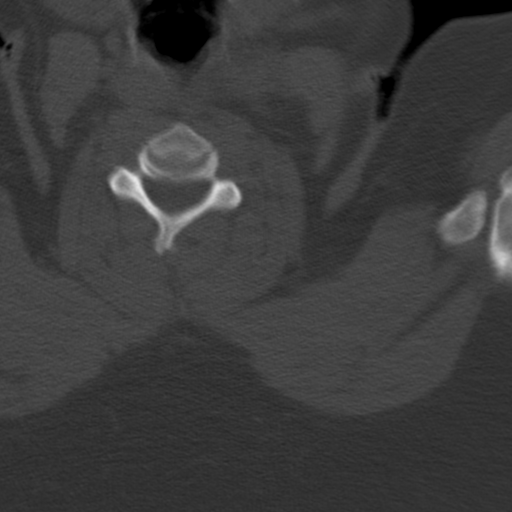
[im 60/90  bone]
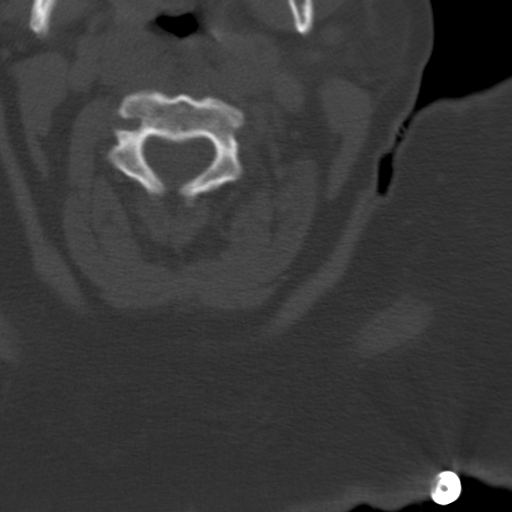
[im 70/90  bone]
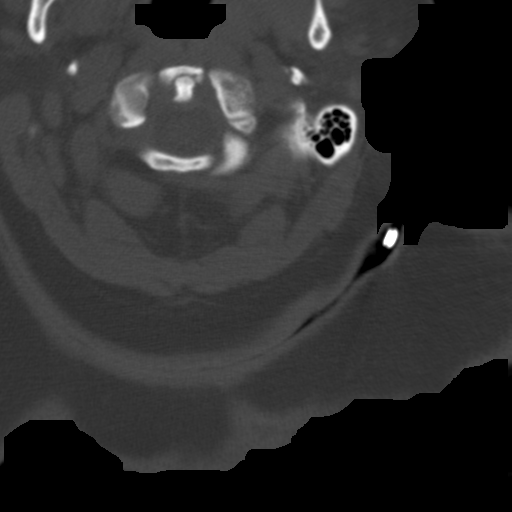
[im 80/90  bone]
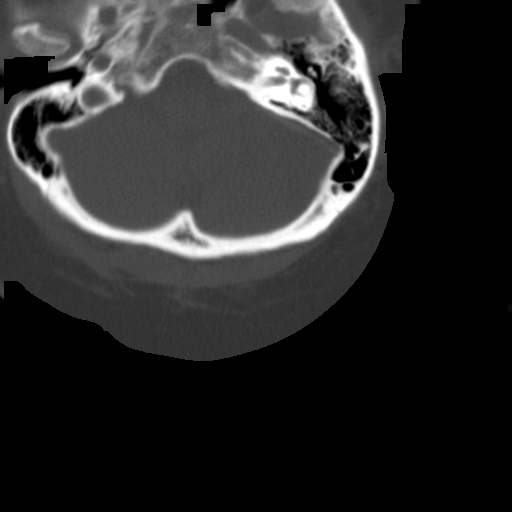

[mpr, sagittal, sagittal · sagittal · 0.35mm/px · 5 of 87 slices shown, 6 images]
[im 29/87  bone]
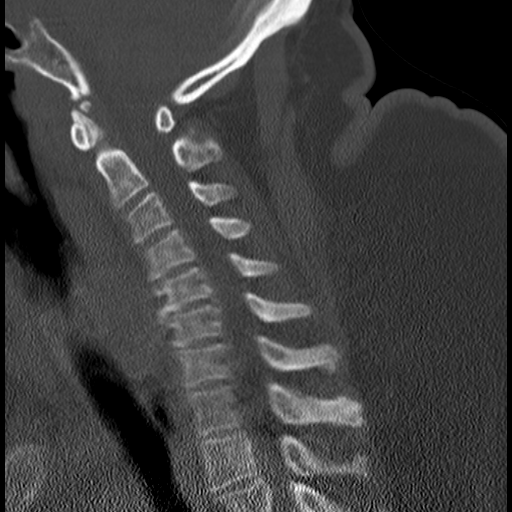
[im 36/87  bone]
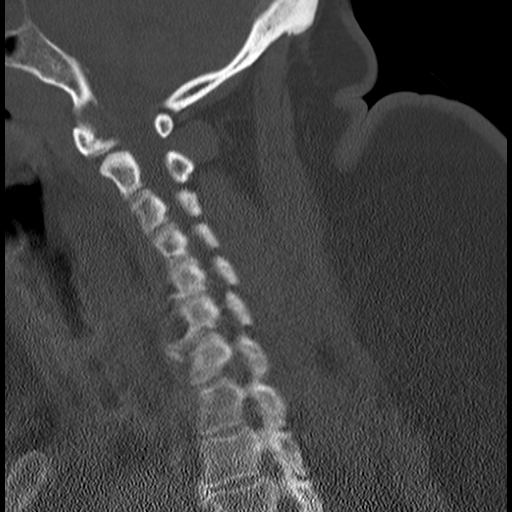
[im 44/87  soft-tissue]
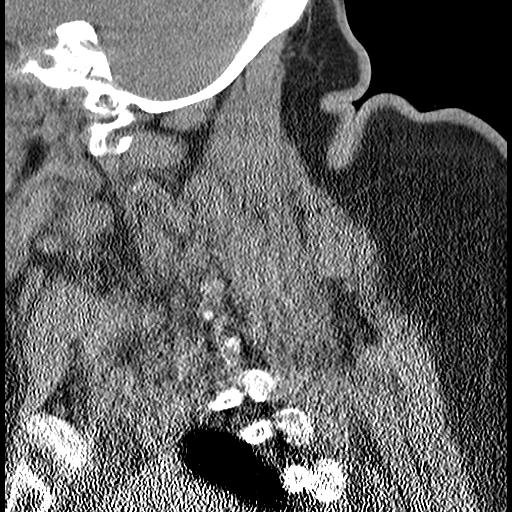
[im 44/87  bone]
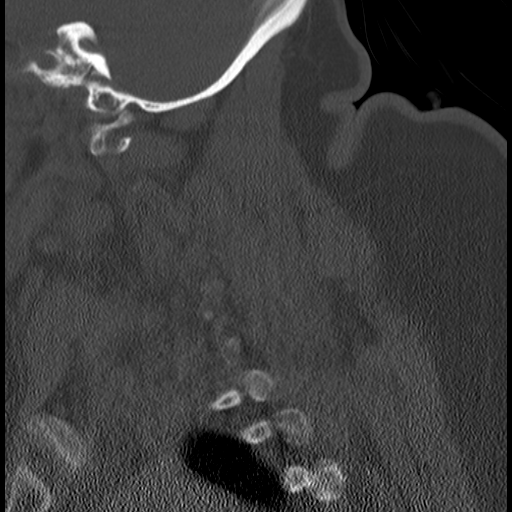
[im 51/87  bone]
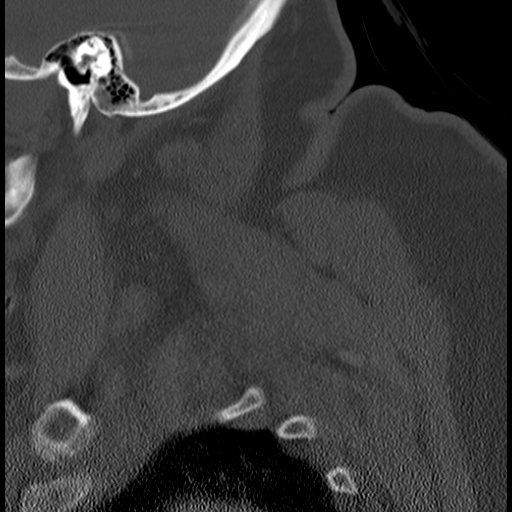
[im 58/87  bone]
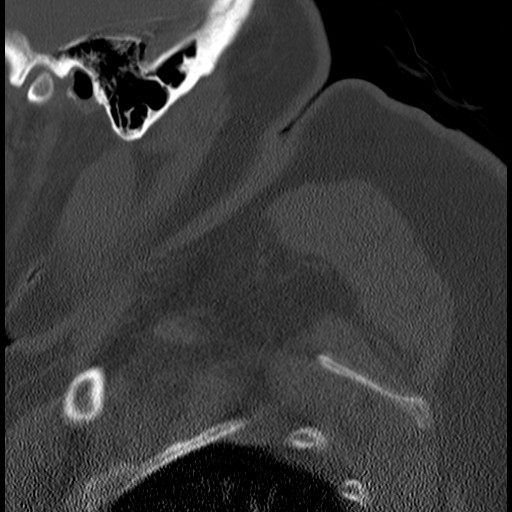

[sag · coronal · 0.35mm/px · 3 of 39 slices shown]
[im 8/39  bone]
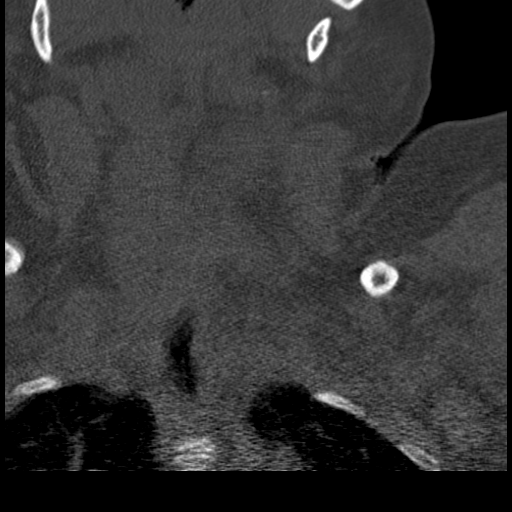
[im 16/39  bone]
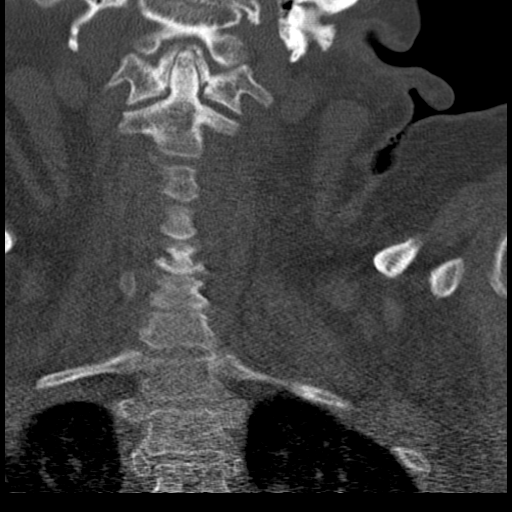
[im 23/39  bone]
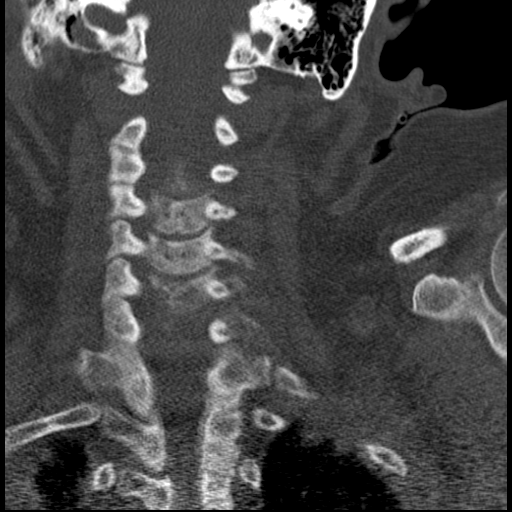

[16 of 33 positions shown; findings below may reference images not displayed]

FINDINGS: Again seen is a chronic microvascular ischemic change and
a remote lacunar infarction in the right caudate head.  No evidence
of acute abnormality including acute infarction, hemorrhage, mass
lesion, mass effect, midline shift or abnormal extra-axial fluid
collection.  Calvarium intact.  No hydrocephalus.
IMPRESSION: 1.  No acute abnormality.
2.  Atrophy and chronic microvascular ischemic change.  Remote
right caudate head lacunar infarction again noted.

CT CERVICAL SPINE
FINDINGS: There is no fracture or subluxation of the cervical
spine.  Cervical spondylosis with degenerative disc disease most
notable at C5-6 again seen.  Lung apices clear.
IMPRESSION: No acute finding.  Stable compared to prior exam.

## 2011-04-12 LAB — VALPROIC ACID LEVEL: Valproic Acid Lvl: 26.7 — ABNORMAL LOW

## 2011-04-12 LAB — CBC
HCT: 33.2 — ABNORMAL LOW
HCT: 34.1 — ABNORMAL LOW
HCT: 35.5 — ABNORMAL LOW
Hemoglobin: 11.2 — ABNORMAL LOW
MCHC: 33.4
MCV: 92
MCV: 93.2
Platelets: 222
Platelets: 227
RBC: 3.66 — ABNORMAL LOW
RBC: 3.84 — ABNORMAL LOW
WBC: 7.2
WBC: 7.4
WBC: 9.6

## 2011-04-12 LAB — BASIC METABOLIC PANEL
BUN: 16
BUN: 24 — ABNORMAL HIGH
CO2: 37 — ABNORMAL HIGH
CO2: 39 — ABNORMAL HIGH
Chloride: 91 — ABNORMAL LOW
Chloride: 95 — ABNORMAL LOW
Creatinine, Ser: 0.86
GFR calc Af Amer: >60
Potassium: 4
Potassium: 4.1

## 2011-04-12 LAB — CARDIAC PANEL(CRET KIN+CKTOT+MB+TROPI)
CK, MB: 3.5
Relative Index: 1.2
Relative Index: 1.2
Relative Index: 1.5
Total CK: 281 — ABNORMAL HIGH
Troponin I: 0.02
Troponin I: 0.02
Troponin I: 0.03

## 2011-04-12 LAB — DIFFERENTIAL
Basophils Absolute: 0
Basophils Relative: 0
Lymphocytes Relative: 24
Monocytes Absolute: 0.5
Neutro Abs: 5
Neutrophils Relative %: 68

## 2011-04-12 LAB — COMPREHENSIVE METABOLIC PANEL
Albumin: 3.5
Alkaline Phosphatase: 66
BUN: 12
Chloride: 91 — ABNORMAL LOW
Creatinine, Ser: 0.92
GFR calc non Af Amer: >60
Glucose, Bld: 104 — ABNORMAL HIGH
Potassium: 3.9
Total Bilirubin: 0.3

## 2011-04-12 LAB — POCT CARDIAC MARKERS
Myoglobin, poc: 196
Operator id: 196461

## 2011-04-12 LAB — D-DIMER, QUANTITATIVE: D-Dimer, Quant: 0.41

## 2011-04-12 LAB — TROPONIN I: Troponin I: 0.02

## 2011-04-12 LAB — CK TOTAL AND CKMB (NOT AT ARMC): Total CK: 321 — ABNORMAL HIGH

## 2011-04-21 LAB — POCT CARDIAC MARKERS
CKMB, poc: 1.4
Myoglobin, poc: 116
Operator id: 196461
Troponin i, poc: <0.05

## 2011-04-21 LAB — I-STAT 8, (EC8 V) (CONVERTED LAB)
Bicarbonate: 41 — ABNORMAL HIGH
Glucose, Bld: 140 — ABNORMAL HIGH
TCO2: 43
pCO2, Ven: 52.7 — ABNORMAL HIGH
pH, Ven: 7.499 — ABNORMAL HIGH

## 2011-04-21 LAB — POCT I-STAT CREATININE: Operator id: 196461

## 2011-08-16 IMAGING — CR DG CHEST 1V PORT
1 series · 1 of 1 positions shown · non-contrast
Comparison: 04/02/2010

CLINICAL DATA: Infection.  Wheezing.  Shortness of breath.

PORTABLE CHEST - 1 VIEW

[AP]
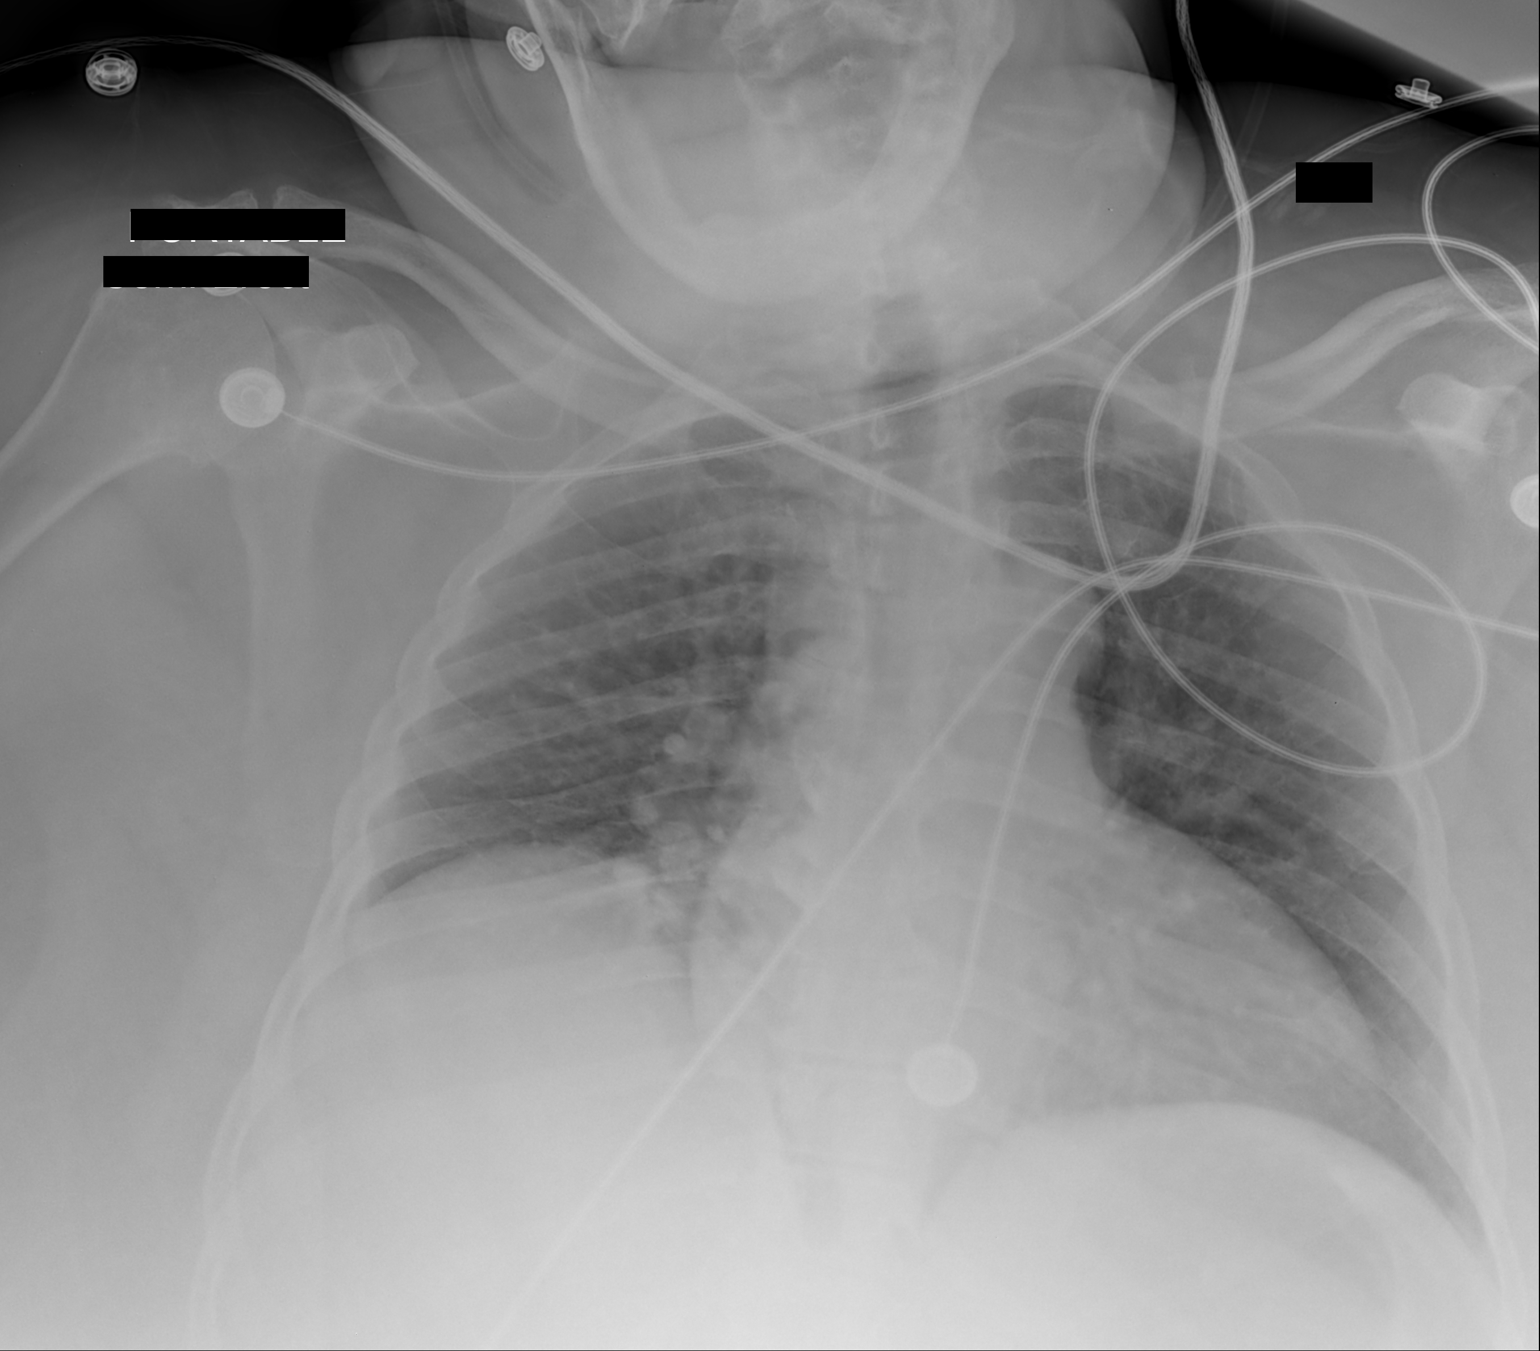

[1 of 1 positions shown; findings below may reference images not displayed]

FINDINGS: Low lung volumes are noted.  Thoracic spondylosis is
present.  There is subsegmental atelectasis at the right lung base.

Prominent vasculature could be due to semi erect positioning or
pulmonary venous hypertension.  No overt edema identified although
there is mild cardiomegaly.
IMPRESSION: 1.  Elevated right hemidiaphragm with right basilar scarring or
atelectasis.
2.  Borderline cardiomegaly with possible pulmonary venous
hypertension.
3.  Thoracic spondylosis.

## 2011-08-17 IMAGING — US US RENAL
1 series · 14 of 21 positions shown · non-contrast
Comparison: None.

CLINICAL DATA: Acute renal failure

RENAL/URINARY TRACT ULTRASOUND COMPLETE

[Series 1: us renal · 0.30mm/px · 14 of 21 slices shown]
[im 1/21]
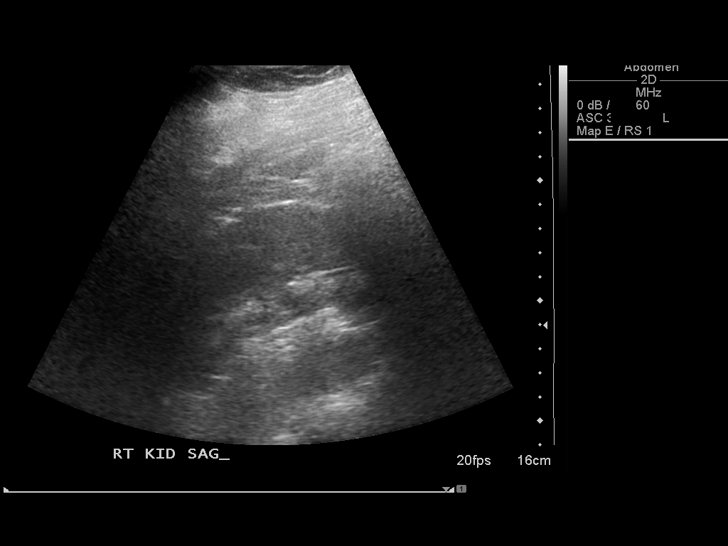
[im 3/21]
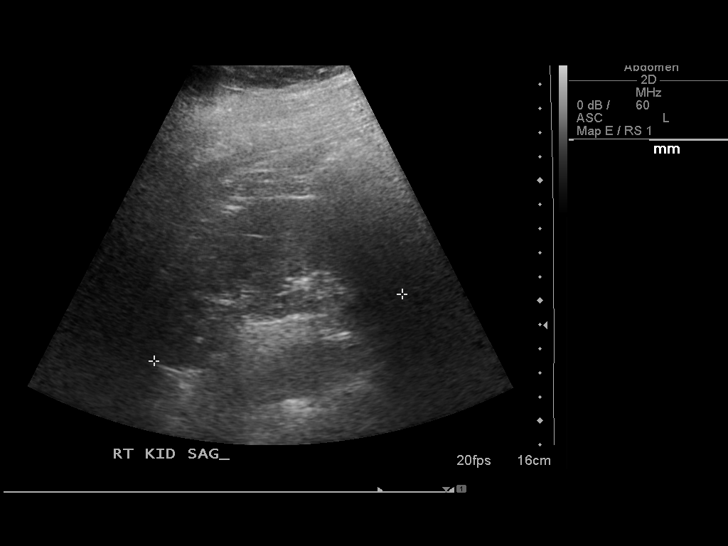
[im 4/21]
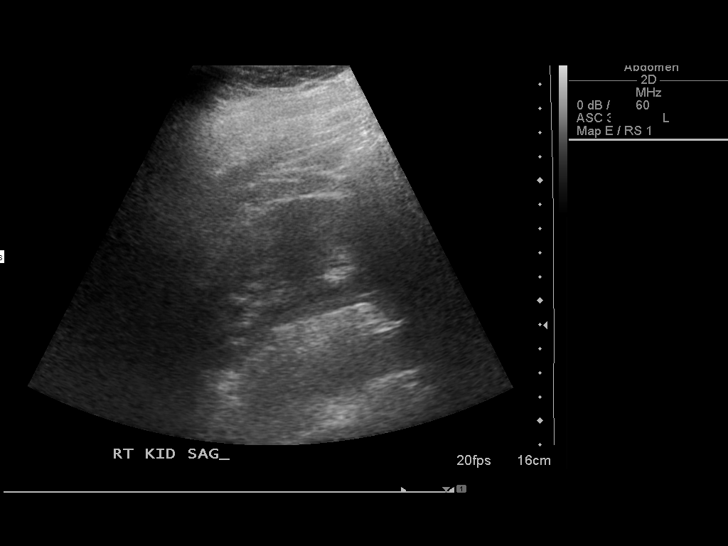
[im 6/21]
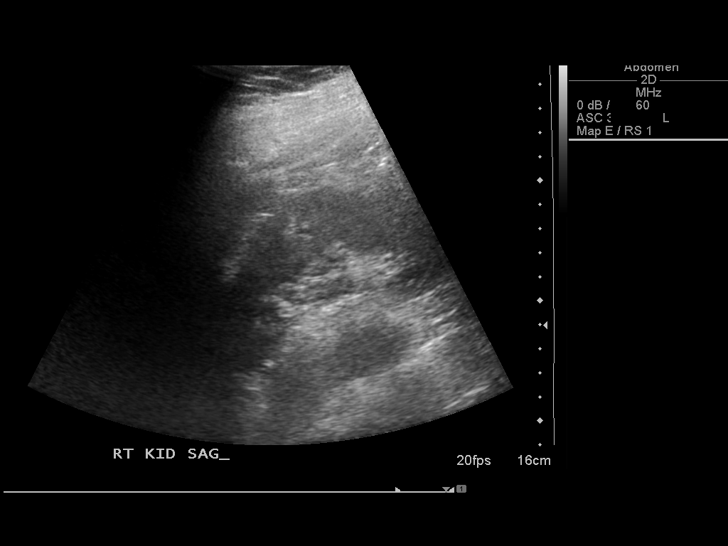
[im 7/21]
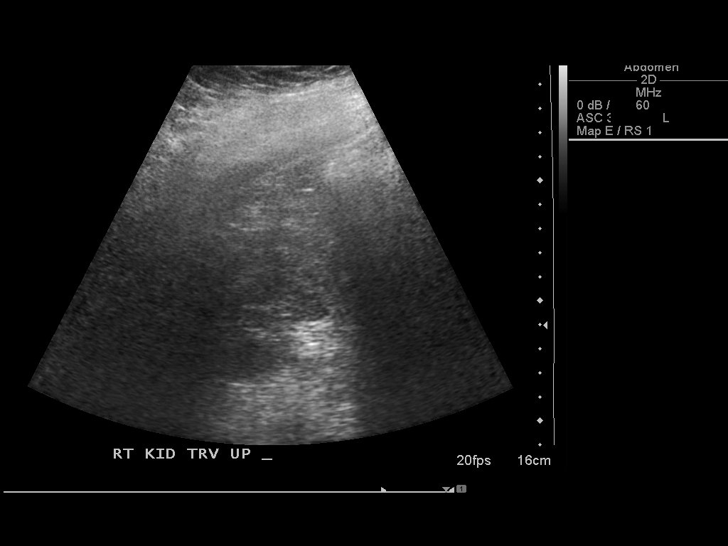
[im 9/21]
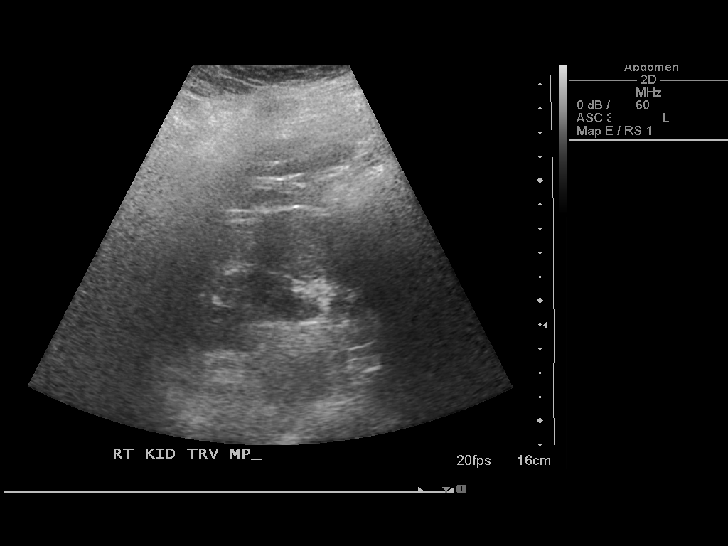
[im 10/21]
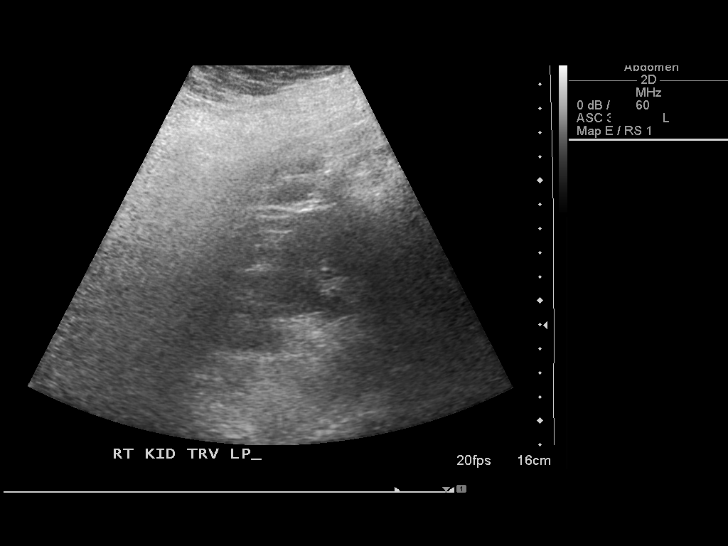
[im 12/21]
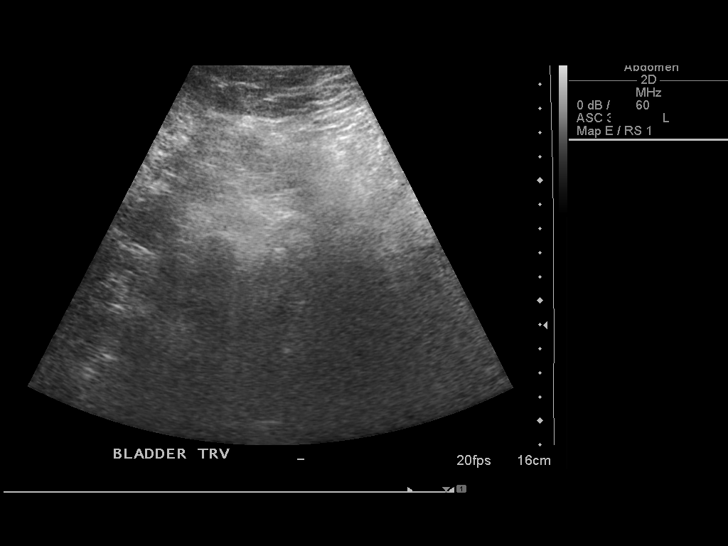
[im 13/21]
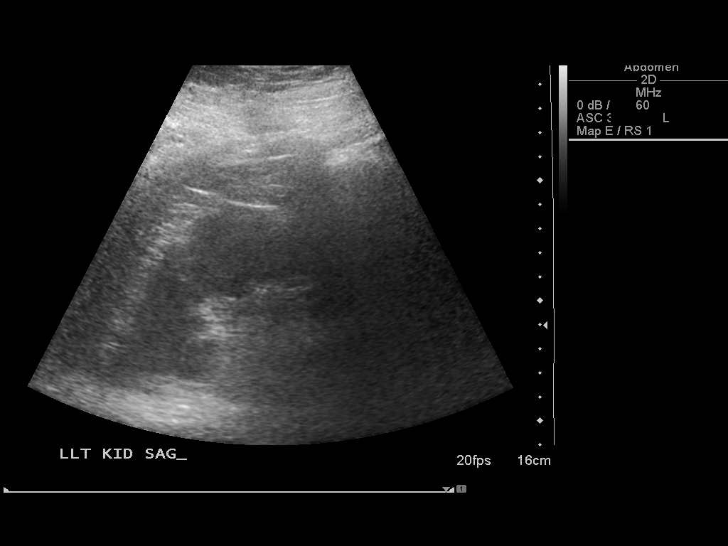
[im 15/21]
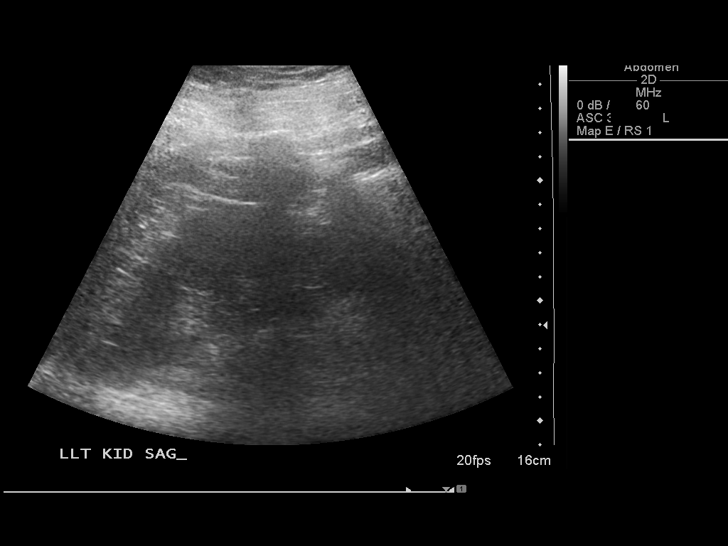
[im 16/21]
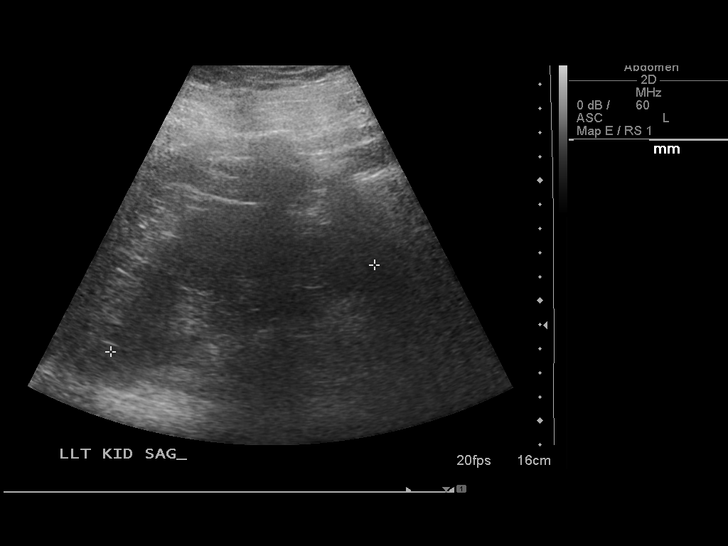
[im 18/21]
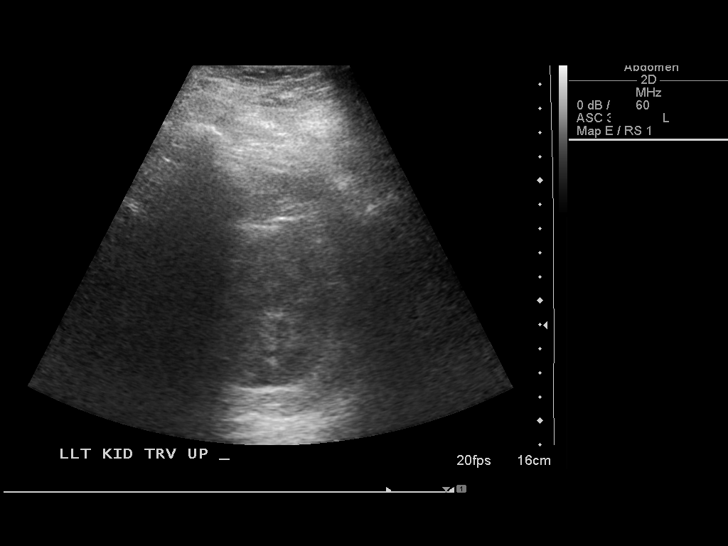
[im 19/21]
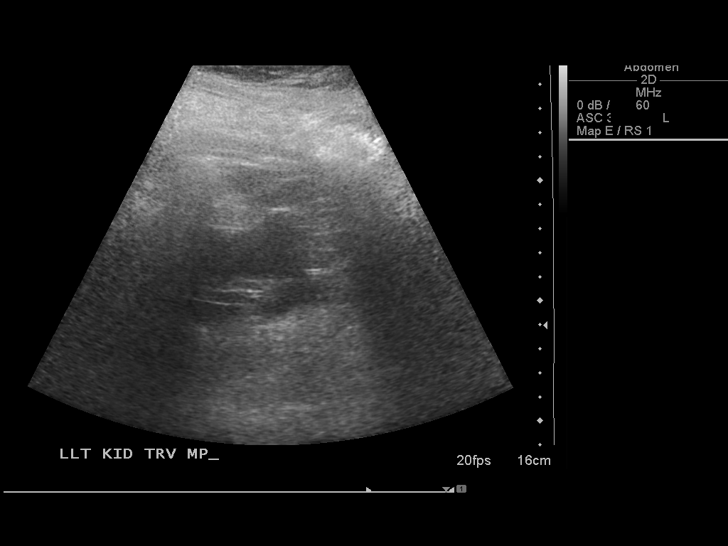
[im 21/21]
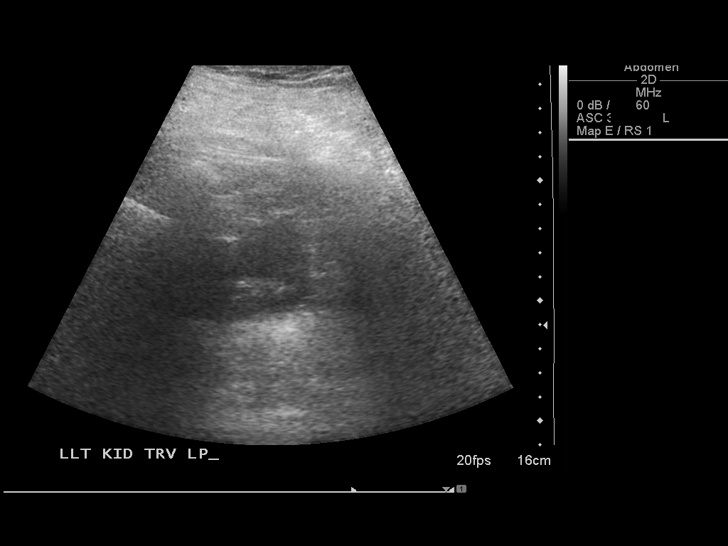

[14 of 21 positions shown; findings below may reference images not displayed]

FINDINGS: Right Kidney:  10.7 cm in length.  No hydronephrosis.  No
parenchymal lesions.  No calculi are visible.

Left Kidney:  11.6 cm in length.  No hydronephrosis or other
pathology.

Bladder:  Decompressed by Foley catheter.
IMPRESSION: Normal renal size without hydronephrosis or other sonographic
findings.

## 2011-08-17 IMAGING — CR DG CHEST 2V
2 series · 2 of 2 positions shown · non-contrast
Comparison: Single view of the chest 08/08/2010 and CT chest
10/06/2009.

CLINICAL DATA: Shortness of breath and congestion.  Cellulitis.

CHEST - 2 VIEW

[w chest lat]
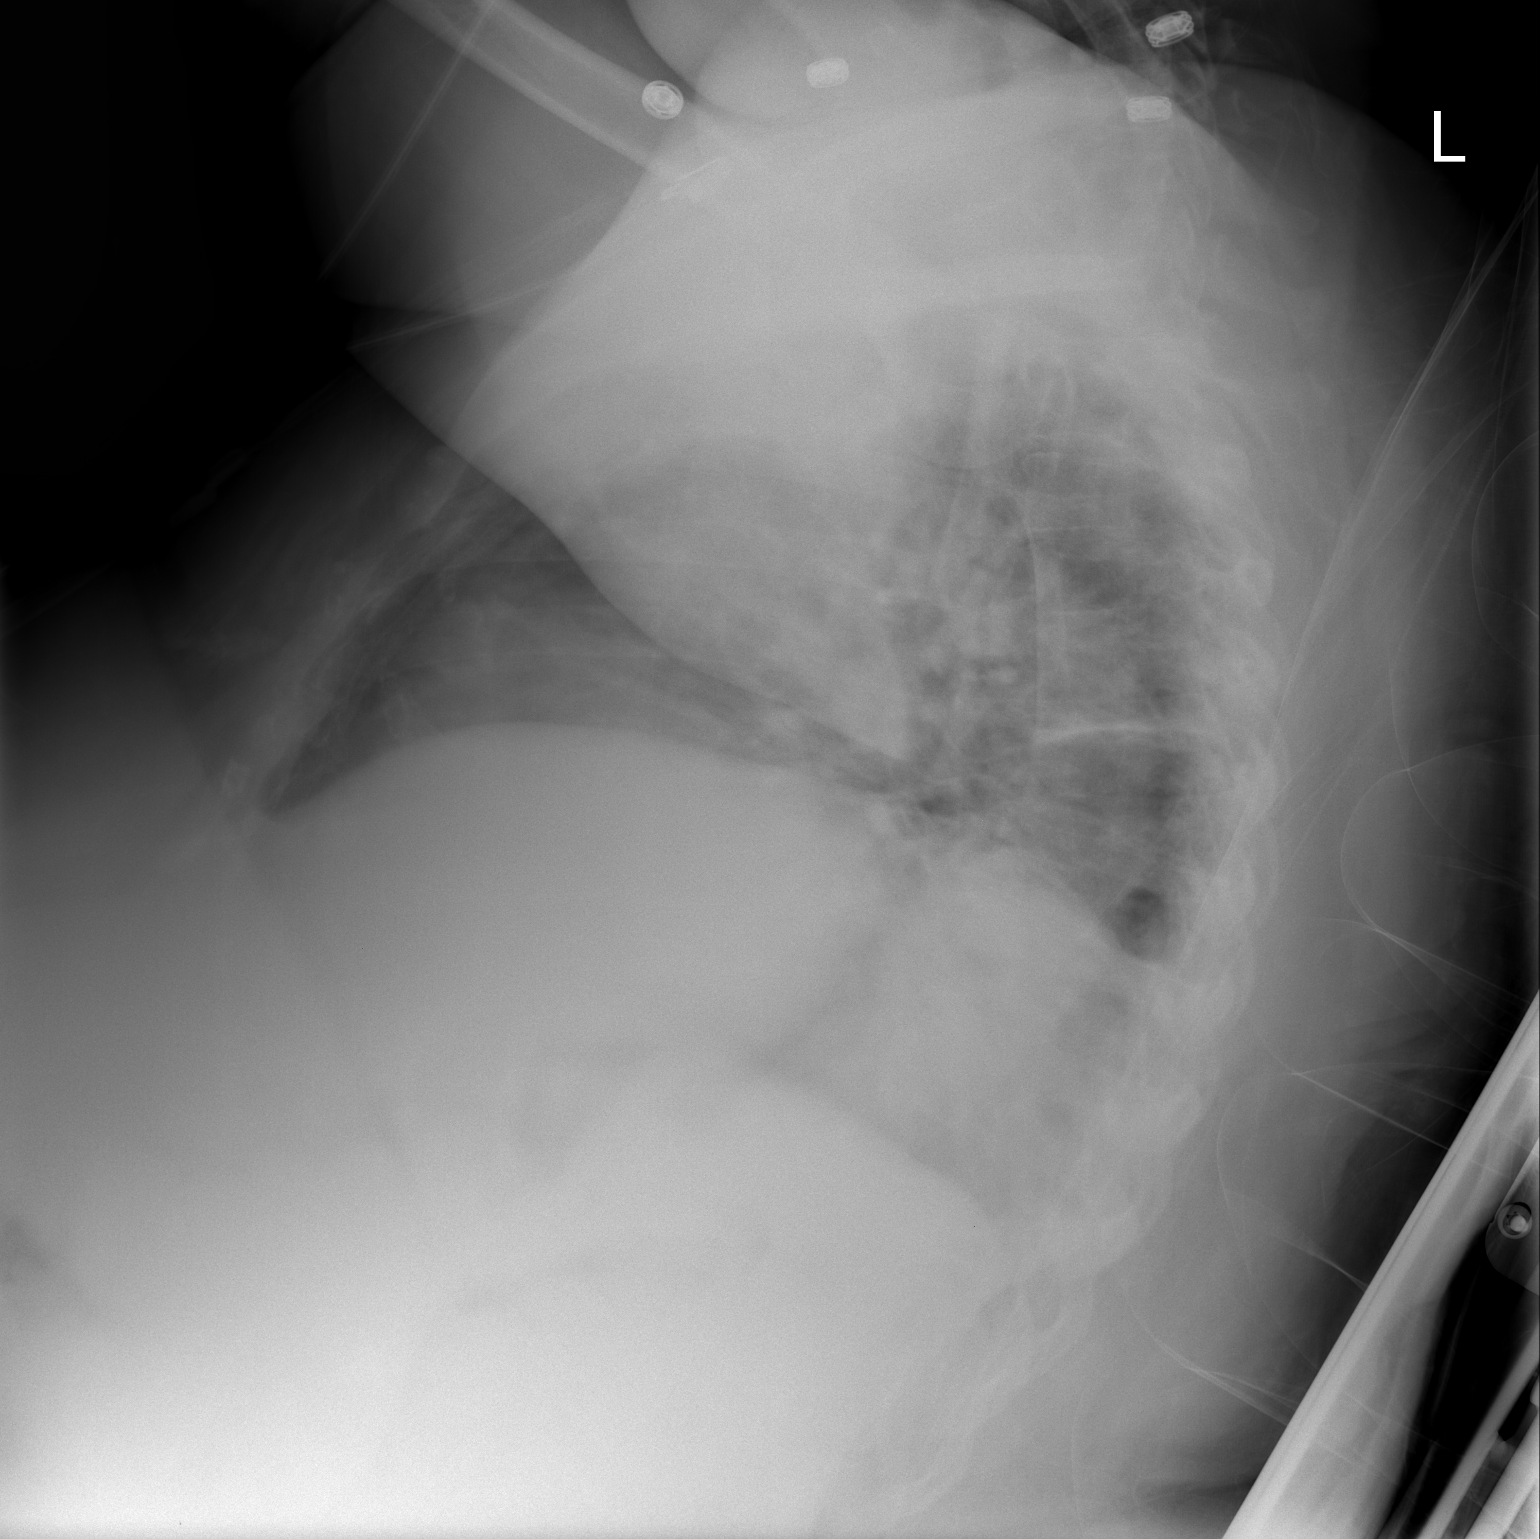

[view not recorded]
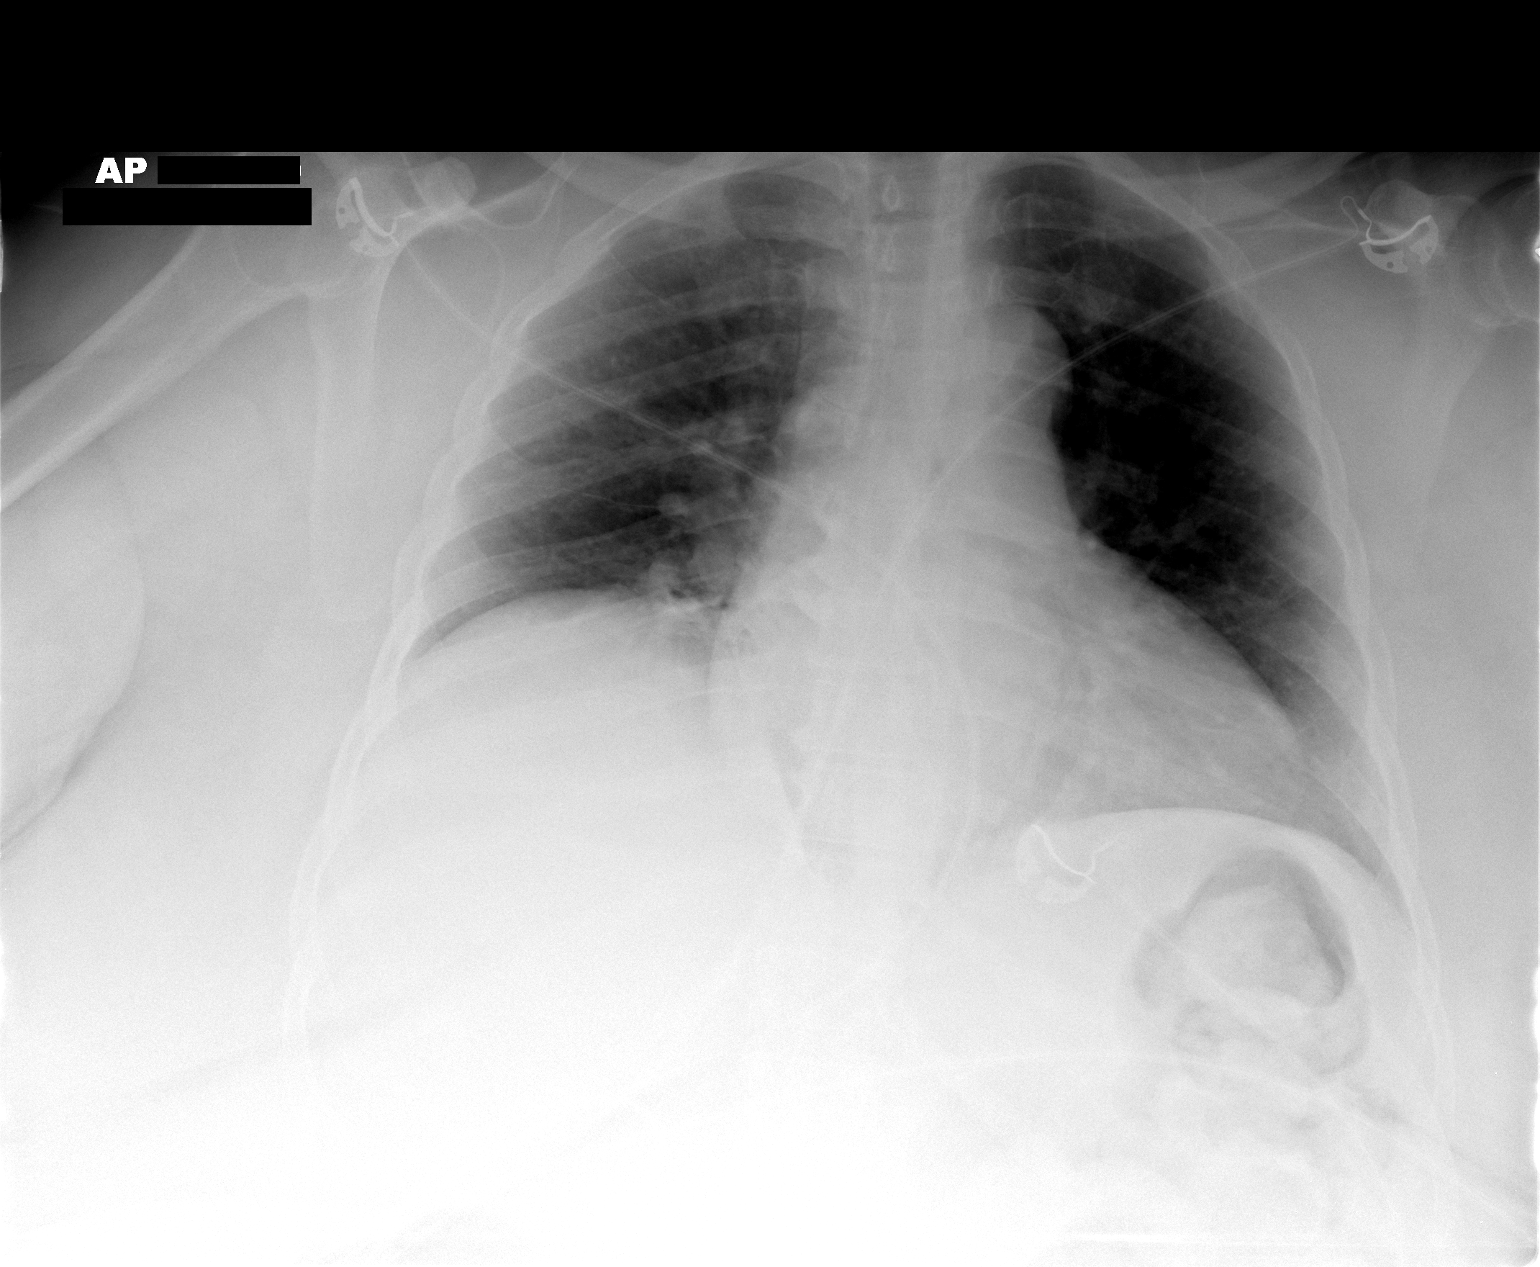

[2 of 2 positions shown; findings below may reference images not displayed]

FINDINGS: The lungs are clear.  Elevation of the right
hemidiaphragm is noted.  Heart size is upper normal.  No
pneumothorax or pleural effusion.
IMPRESSION: No acute disease.

## 2011-08-17 IMAGING — NM NM PULMONARY VENT & PERF
2 series · 12 of 12 positions shown · non-contrast
Comparison: Chest x-ray 08/09/2010

CLINICAL DATA: Shortness of breath.

NUCLEAR MEDICINE VENTILATION - PERFUSION LUNG SCAN
TECHNIQUE: Wash-in, equilibrium, and wash-out phase ventilation
images were obtained using He-CYY gas.  Perfusion images were
obtained in multiple projections after intravenous injection of Tc-
99m MAA.
Radiopharmaceuticals:  10.0 mCi He-CYY gas and 5.0 mCi Rc-33m MAA.

[Series 1: vq ventlung · 3.26mm/px · 6 of 19 frames shown (1 of 2)]
[frame 2/19  full-range]
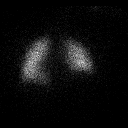
[frame 5/19  full-range]
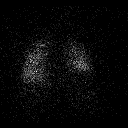
[frame 8/19]
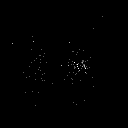
[frame 11/19]
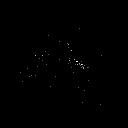
[frame 14/19]
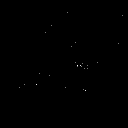
[frame 18/19]
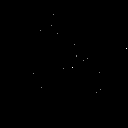

[Series 1: vq ventlung · 3.25mm/px · 6 of 19 frames shown (2 of 2)]
[frame 2/19  full-range]
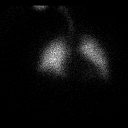
[frame 5/19  full-range]
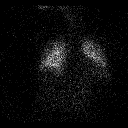
[frame 8/19  full-range]
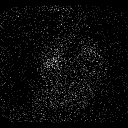
[frame 11/19]
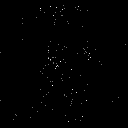
[frame 14/19]
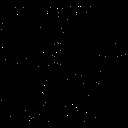
[frame 18/19]
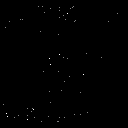

[12 of 12 positions shown; findings below may reference images not displayed]

FINDINGS: The ventilation scan demonstrates no ventilation defects.
Slight delayed washout of the radiopharmaceutical suggesting mild
air trapping.  Moderate eventration of the right hemidiaphragm is
noted.

The perfusion scan demonstrates no segmental or subsegmental
perfusion defects to suggest pulmonary embolism.
IMPRESSION: Negative VQ scan for pulmonary embolism.

## 2011-08-18 IMAGING — CR DG CHEST 1V PORT
1 series · 1 of 1 positions shown · non-contrast
Comparison: 08/09/2010

CLINICAL DATA: PICC line placement

PORTABLE CHEST - 1 VIEW

[AP]
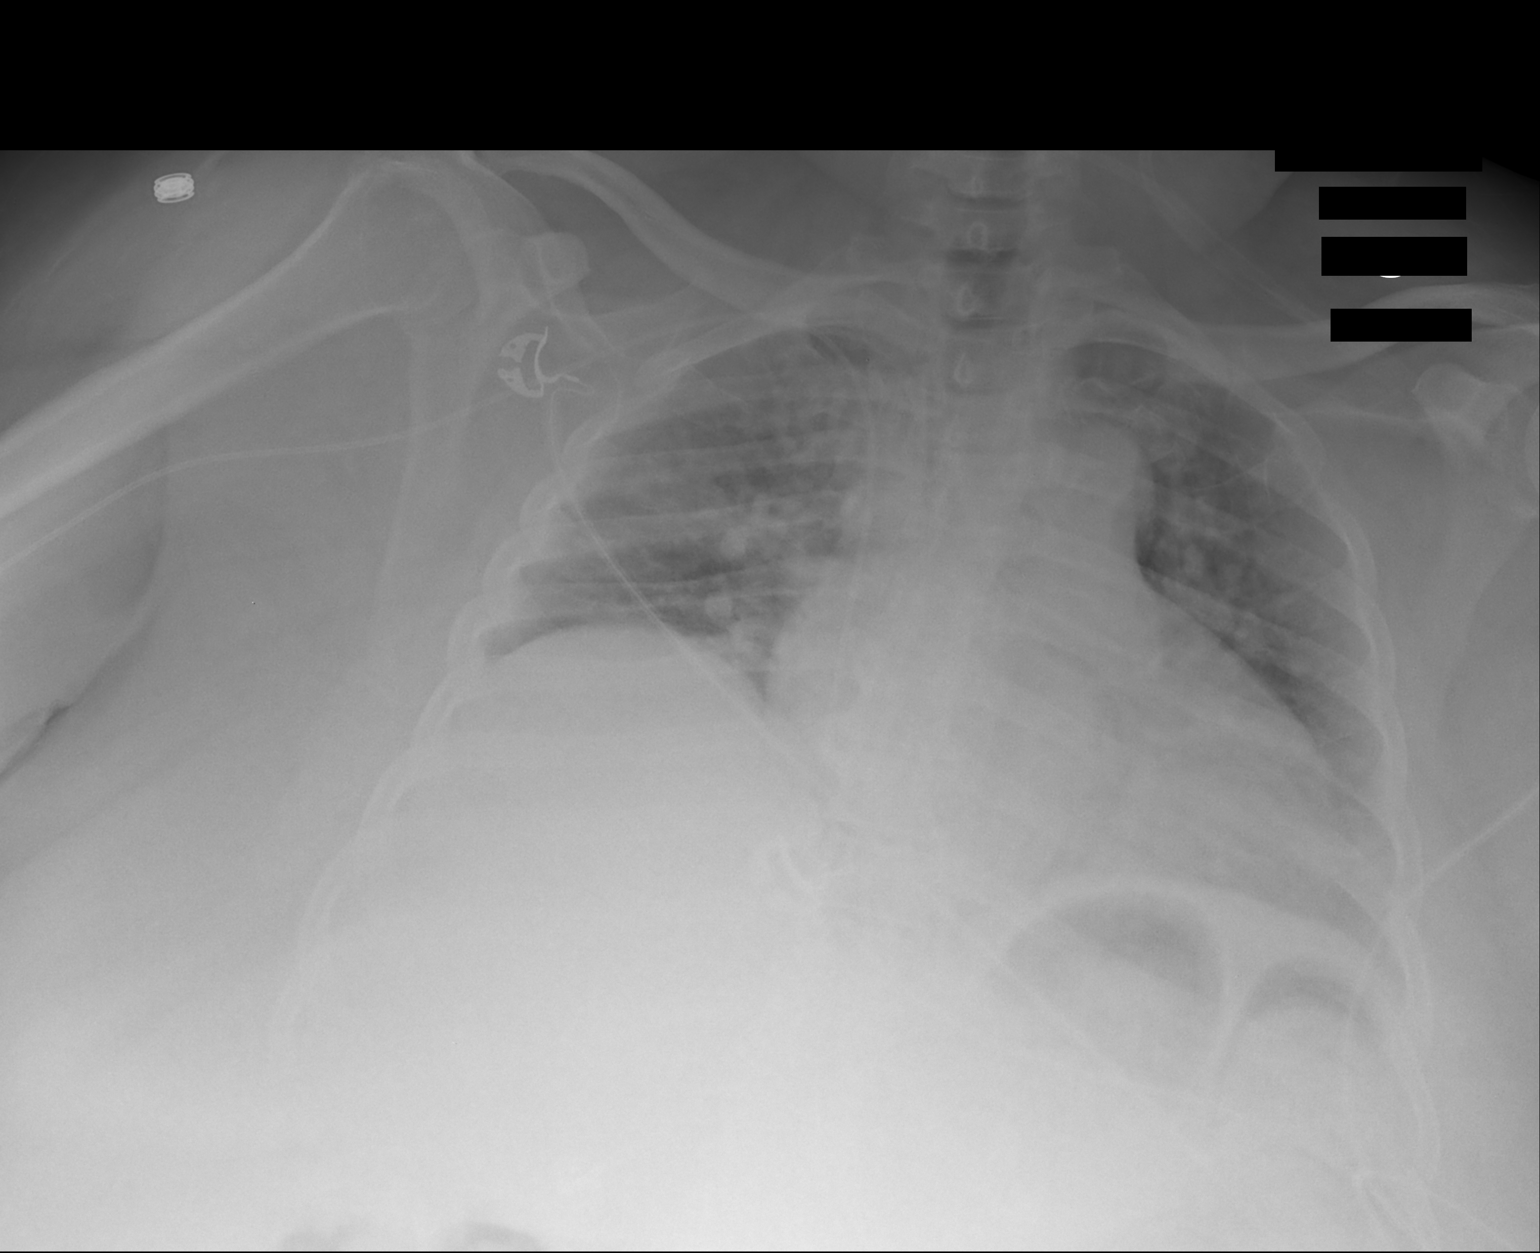

[1 of 1 positions shown; findings below may reference images not displayed]

FINDINGS: This is a low-volume film.
The cardiomediastinal silhouette is stable.
Mild pulmonary vascular congestion is present.

A right PICC line is identified with tip overlying the mid - lower
right atrium.
There is no evidence of pneumothorax.
IMPRESSION: Right PICC line with tip overlying mid - lower right atrium,
however this is a low-volume film.

Mild pulmonary vascular congestion.

## 2011-09-01 ENCOUNTER — Emergency Department (HOSPITAL_COMMUNITY): Payer: Medicaid Other

## 2011-09-01 ENCOUNTER — Inpatient Hospital Stay (HOSPITAL_COMMUNITY)
Admission: EM | Admit: 2011-09-01 | Discharge: 2011-09-04 | DRG: 189 | Disposition: A | Discharge status: Skilled Nursing Facility | Payer: Medicaid Other | Attending: Internal Medicine | Admitting: Internal Medicine

## 2011-09-01 ENCOUNTER — Other Ambulatory Visit: Payer: Self-pay

## 2011-09-01 DIAGNOSIS — J309 Allergic rhinitis, unspecified: Secondary | ICD-10-CM

## 2011-09-01 DIAGNOSIS — J449 Chronic obstructive pulmonary disease, unspecified: Secondary | ICD-10-CM

## 2011-09-01 DIAGNOSIS — F319 Bipolar disorder, unspecified: Secondary | ICD-10-CM

## 2011-09-01 DIAGNOSIS — I10 Essential (primary) hypertension: Secondary | ICD-10-CM

## 2011-09-01 DIAGNOSIS — F419 Anxiety disorder, unspecified: Secondary | ICD-10-CM

## 2011-09-01 DIAGNOSIS — J441 Chronic obstructive pulmonary disease with (acute) exacerbation: Secondary | ICD-10-CM | POA: Diagnosis present

## 2011-09-01 DIAGNOSIS — R7881 Bacteremia: Secondary | ICD-10-CM

## 2011-09-01 DIAGNOSIS — K219 Gastro-esophageal reflux disease without esophagitis: Secondary | ICD-10-CM

## 2011-09-01 DIAGNOSIS — Z7982 Long term (current) use of aspirin: Secondary | ICD-10-CM

## 2011-09-01 DIAGNOSIS — B9689 Other specified bacterial agents as the cause of diseases classified elsewhere: Secondary | ICD-10-CM | POA: Diagnosis present

## 2011-09-01 DIAGNOSIS — Z6841 Body Mass Index (BMI) 40.0 and over, adult: Secondary | ICD-10-CM

## 2011-09-01 DIAGNOSIS — F329 Major depressive disorder, single episode, unspecified: Secondary | ICD-10-CM

## 2011-09-01 DIAGNOSIS — F3289 Other specified depressive episodes: Secondary | ICD-10-CM

## 2011-09-01 DIAGNOSIS — J9610 Chronic respiratory failure, unspecified whether with hypoxia or hypercapnia: Secondary | ICD-10-CM

## 2011-09-01 DIAGNOSIS — J45901 Unspecified asthma with (acute) exacerbation: Secondary | ICD-10-CM | POA: Diagnosis present

## 2011-09-01 DIAGNOSIS — I890 Lymphedema, not elsewhere classified: Secondary | ICD-10-CM

## 2011-09-01 DIAGNOSIS — Z9981 Dependence on supplemental oxygen: Secondary | ICD-10-CM

## 2011-09-01 DIAGNOSIS — J4489 Other specified chronic obstructive pulmonary disease: Secondary | ICD-10-CM

## 2011-09-01 DIAGNOSIS — R0902 Hypoxemia: Secondary | ICD-10-CM

## 2011-09-01 DIAGNOSIS — G4733 Obstructive sleep apnea (adult) (pediatric): Secondary | ICD-10-CM

## 2011-09-01 DIAGNOSIS — E119 Type 2 diabetes mellitus without complications: Secondary | ICD-10-CM

## 2011-09-01 DIAGNOSIS — I739 Peripheral vascular disease, unspecified: Secondary | ICD-10-CM | POA: Diagnosis present

## 2011-09-01 DIAGNOSIS — J9620 Acute and chronic respiratory failure, unspecified whether with hypoxia or hypercapnia: Principal | ICD-10-CM

## 2011-09-01 DIAGNOSIS — Z79899 Other long term (current) drug therapy: Secondary | ICD-10-CM

## 2011-09-01 HISTORY — DX: Lymphedema, not elsewhere classified: I89.0

## 2011-09-01 HISTORY — DX: Obesity, class 3: E66.813

## 2011-09-01 HISTORY — DX: Peripheral vascular disease, unspecified: I73.9

## 2011-09-01 HISTORY — DX: Obstructive sleep apnea (adult) (pediatric): G47.33

## 2011-09-01 HISTORY — DX: Gastro-esophageal reflux disease without esophagitis: K21.9

## 2011-09-01 HISTORY — DX: Dependence on other enabling machines and devices: Z99.89

## 2011-09-01 HISTORY — DX: Chronic respiratory failure, unspecified whether with hypoxia or hypercapnia: J96.10

## 2011-09-01 HISTORY — DX: Essential (primary) hypertension: I10

## 2011-09-01 HISTORY — DX: Bipolar disorder, unspecified: F31.9

## 2011-09-01 HISTORY — DX: Morbid (severe) obesity due to excess calories: E66.01

## 2011-09-01 LAB — BLOOD GAS, ARTERIAL
Acid-Base Excess: 10.6 mmol/L — ABNORMAL HIGH (ref 0.0–2.0)
Bicarbonate: 38.1 mEq/L — ABNORMAL HIGH (ref 20.0–24.0)
Patient temperature: 98.6
TCO2: 36 mmol/L (ref 0–100)
pH, Arterial: 7.334 — ABNORMAL LOW (ref 7.350–7.400)

## 2011-09-01 LAB — DIFFERENTIAL
Basophils Relative: 0 % (ref 0–1)
Eosinophils Absolute: 0.3 10*3/uL (ref 0.0–0.7)
Eosinophils Relative: 5 % (ref 0–5)
Lymphs Abs: 2.1 10*3/uL (ref 0.7–4.0)
Monocytes Absolute: 0.4 10*3/uL (ref 0.1–1.0)
Monocytes Relative: 8 % (ref 3–12)
Neutrophils Relative %: 49 % (ref 43–77)

## 2011-09-01 LAB — APTT: aPTT: 27 seconds (ref 24–37)

## 2011-09-01 LAB — PRO B NATRIURETIC PEPTIDE: Pro B Natriuretic peptide (BNP): 63.5 pg/mL (ref 0–125)

## 2011-09-01 LAB — CBC
Hemoglobin: 9.5 g/dL — ABNORMAL LOW (ref 12.0–15.0)
MCH: 27 pg (ref 26.0–34.0)
MCHC: 31 g/dL (ref 30.0–36.0)
MCV: 86.9 fL (ref 78.0–100.0)
RBC: 3.52 MIL/uL — ABNORMAL LOW (ref 3.87–5.11)

## 2011-09-01 LAB — PROTIME-INR: Prothrombin Time: 12.5 seconds (ref 11.6–15.2)

## 2011-09-01 MED ORDER — METHYLPREDNISOLONE SODIUM SUCC 125 MG IJ SOLR
125.0000 mg | Freq: Once | INTRAMUSCULAR | Status: AC
  Administered 2011-09-01: 125 mg via INTRAVENOUS
  Filled 2011-09-01: qty 2

## 2011-09-01 MED ORDER — ALBUTEROL SULFATE (5 MG/ML) 0.5% IN NEBU
5.0000 mg | INHALATION_SOLUTION | RESPIRATORY_TRACT | Status: DC | PRN
  Administered 2011-09-01: 5 mg via RESPIRATORY_TRACT
  Filled 2011-09-01: qty 1
  Filled 2011-09-01: qty 2

## 2011-09-01 MED ORDER — IPRATROPIUM BROMIDE 0.02 % IN SOLN
0.5000 mg | Freq: Once | RESPIRATORY_TRACT | Status: AC
  Administered 2011-09-01: 0.5 mg via RESPIRATORY_TRACT
  Filled 2011-09-01: qty 2.5

## 2011-09-01 NOTE — ED Notes (Signed)
Per EMS sob x several days getting worse. +productive cough with green brown sputum.  Scattered Rhonchi in all fields

## 2011-09-01 NOTE — ED Notes (Signed)
The Patient was on the phne when rn went in to assess.  Currently on NRB mask. Scattered rhonchi in all fields.  sts has been sickx 2-3 weeks. + Productive cough with brown sputum.  O2 sat 100% .  Noted foul odor from left leg wrap. + pitting edema bilaterally

## 2011-09-01 NOTE — ED Notes (Signed)
ZOX:[id number]<AV> Expected date:09/01/11<BR> Expected time:<BR> Means of arrival:Ambulance<BR> Comments:<BR> EMS 231 GC - sob/congestion

## 2011-09-01 NOTE — ED Notes (Signed)
Respiratory here for abg

## 2011-09-01 NOTE — ED Notes (Signed)
Dr Langston Masker in to see

## 2011-09-01 NOTE — ED Notes (Signed)
Blood drawn by Phlebotomy

## 2011-09-01 NOTE — ED Notes (Signed)
RT calls panic levels of ABG. Results taken to Dr Otilio Carpen and patient changed to [place] @ 4L is on 3L at home

## 2011-09-02 ENCOUNTER — Encounter (HOSPITAL_COMMUNITY): Payer: Self-pay | Admitting: Family Medicine

## 2011-09-02 DIAGNOSIS — E119 Type 2 diabetes mellitus without complications: Secondary | ICD-10-CM

## 2011-09-02 DIAGNOSIS — J9620 Acute and chronic respiratory failure, unspecified whether with hypoxia or hypercapnia: Secondary | ICD-10-CM | POA: Diagnosis present

## 2011-09-02 LAB — CBC
MCHC: 33 g/dL (ref 30.0–36.0)
Platelets: ADEQUATE 10*3/uL (ref 150–400)
RDW: 14.4 % (ref 11.5–15.5)

## 2011-09-02 LAB — BASIC METABOLIC PANEL
BUN: 22 mg/dL (ref 6–23)
BUN: 19 mg/dL (ref 6–23)
Calcium: 9.1 mg/dL (ref 8.4–10.5)
Calcium: 9.4 mg/dL (ref 8.4–10.5)
Creatinine, Ser: 1.02 mg/dL (ref 0.50–1.10)
Creatinine, Ser: 0.92 mg/dL (ref 0.50–1.10)
GFR calc Af Amer: 67 mL/min — ABNORMAL LOW (ref >90)
GFR calc Af Amer: 76 mL/min — ABNORMAL LOW (ref >90)
GFR calc non Af Amer: 58 mL/min — ABNORMAL LOW (ref >90)
GFR calc non Af Amer: 65 mL/min — ABNORMAL LOW (ref >90)
Glucose, Bld: 105 mg/dL — ABNORMAL HIGH (ref 70–99)
Potassium: 4.1 mEq/L (ref 3.5–5.1)

## 2011-09-02 LAB — GLUCOSE, CAPILLARY: Glucose-Capillary: 129 mg/dL — ABNORMAL HIGH (ref 70–99)

## 2011-09-02 LAB — MRSA PCR SCREENING: MRSA by PCR: NEGATIVE

## 2011-09-02 LAB — VALPROIC ACID LEVEL: Valproic Acid Lvl: 34.7 ug/mL — ABNORMAL LOW (ref 50.0–100.0)

## 2011-09-02 MED ORDER — OLOPATADINE HCL 0.1 % OP SOLN
1.0000 [drp] | Freq: Every day | OPHTHALMIC | Status: DC
  Administered 2011-09-02 – 2011-09-03 (×2): 1 [drp] via OPHTHALMIC
  Filled 2011-09-02: qty 5

## 2011-09-02 MED ORDER — PRO-STAT SUGAR FREE PO LIQD
30.0000 mL | Freq: Two times a day (BID) | ORAL | Status: DC
  Administered 2011-09-02 – 2011-09-04 (×5): 30 mL via ORAL
  Filled 2011-09-02 (×7): qty 30

## 2011-09-02 MED ORDER — SIMETHICONE 80 MG PO CHEW
80.0000 mg | CHEWABLE_TABLET | Freq: Three times a day (TID) | ORAL | Status: DC
  Administered 2011-09-02 – 2011-09-04 (×7): 80 mg via ORAL
  Filled 2011-09-02 (×9): qty 1

## 2011-09-02 MED ORDER — GABAPENTIN 400 MG PO CAPS
400.0000 mg | ORAL_CAPSULE | Freq: Three times a day (TID) | ORAL | Status: DC
  Administered 2011-09-02 – 2011-09-04 (×7): 400 mg via ORAL
  Filled 2011-09-02 (×9): qty 1

## 2011-09-02 MED ORDER — SODIUM CHLORIDE 0.9 % IJ SOLN
3.0000 mL | INTRAMUSCULAR | Status: DC | PRN
  Administered 2011-09-03: 3 mL via INTRAVENOUS

## 2011-09-02 MED ORDER — INSULIN ASPART 100 UNIT/ML ~~LOC~~ SOLN: 0.0000 [IU] | Freq: Every day | SUBCUTANEOUS | Status: DC

## 2011-09-02 MED ORDER — PREDNISONE 50 MG PO TABS
60.0000 mg | ORAL_TABLET | Freq: Every day | ORAL | Status: DC
  Administered 2011-09-03: 60 mg via ORAL
  Filled 2011-09-02: qty 1

## 2011-09-02 MED ORDER — ARIPIPRAZOLE 5 MG PO TABS
5.0000 mg | ORAL_TABLET | Freq: Every day | ORAL | Status: DC
Start: 2011-09-02 — End: 2011-09-04
  Administered 2011-09-02 – 2011-09-04 (×3): 5 mg via ORAL
  Filled 2011-09-02 (×3): qty 1

## 2011-09-02 MED ORDER — BUSPIRONE HCL 10 MG PO TABS
10.0000 mg | ORAL_TABLET | Freq: Two times a day (BID) | ORAL | Status: DC
Start: 2011-09-02 — End: 2011-09-04
  Administered 2011-09-02 – 2011-09-04 (×6): 10 mg via ORAL
  Filled 2011-09-02 (×7): qty 1

## 2011-09-02 MED ORDER — VANCOMYCIN HCL IN DEXTROSE 1-5 GM/200ML-% IV SOLN
1000.0000 mg | Freq: Two times a day (BID) | INTRAVENOUS | Status: DC
  Administered 2011-09-03: 1000 mg via INTRAVENOUS
  Filled 2011-09-02 (×2): qty 200

## 2011-09-02 MED ORDER — NITROGLYCERIN 0.4 MG SL SUBL: 0.4000 mg | SUBLINGUAL_TABLET | SUBLINGUAL | Status: DC | PRN

## 2011-09-02 MED ORDER — MORPHINE SULFATE CR 15 MG PO TB12
15.0000 mg | ORAL_TABLET | Freq: Two times a day (BID) | ORAL | Status: DC | PRN
  Administered 2011-09-03: 15 mg via ORAL
  Filled 2011-09-02: qty 1

## 2011-09-02 MED ORDER — ACETAMINOPHEN 325 MG PO TABS
650.0000 mg | ORAL_TABLET | Freq: Four times a day (QID) | ORAL | Status: DC | PRN
  Administered 2011-09-02: 650 mg via ORAL
  Filled 2011-09-02: qty 2

## 2011-09-02 MED ORDER — METHYLPREDNISOLONE SODIUM SUCC 40 MG IJ SOLR
40.0000 mg | Freq: Two times a day (BID) | INTRAMUSCULAR | Status: DC
  Administered 2011-09-02: 40 mg via INTRAVENOUS
  Filled 2011-09-02 (×4): qty 1

## 2011-09-02 MED ORDER — CLONIDINE HCL 0.1 MG PO TABS
0.1000 mg | ORAL_TABLET | Freq: Four times a day (QID) | ORAL | Status: DC | PRN
  Filled 2011-09-02: qty 1

## 2011-09-02 MED ORDER — ASPIRIN 325 MG PO TABS
325.0000 mg | ORAL_TABLET | Freq: Every day | ORAL | Status: DC
  Administered 2011-09-02 – 2011-09-04 (×3): 325 mg via ORAL
  Filled 2011-09-02 (×3): qty 1

## 2011-09-02 MED ORDER — OLOPATADINE HCL 0.2 % OP SOLN: 1.0000 [drp] | Freq: Every day | OPHTHALMIC | Status: DC

## 2011-09-02 MED ORDER — VANCOMYCIN HCL 1000 MG IV SOLR
2000.0000 mg | Freq: Once | INTRAVENOUS | Status: AC
  Administered 2011-09-02: 2000 mg via INTRAVENOUS
  Filled 2011-09-02: qty 2000

## 2011-09-02 MED ORDER — BENZONATATE 100 MG PO CAPS
100.0000 mg | ORAL_CAPSULE | Freq: Three times a day (TID) | ORAL | Status: DC | PRN
  Administered 2011-09-03: 100 mg via ORAL
  Filled 2011-09-02 (×2): qty 1

## 2011-09-02 MED ORDER — FUROSEMIDE 10 MG/ML IJ SOLN
20.0000 mg | Freq: Once | INTRAMUSCULAR | Status: AC
  Administered 2011-09-02: 20 mg via INTRAVENOUS
  Filled 2011-09-02: qty 2

## 2011-09-02 MED ORDER — DIVALPROEX SODIUM ER 500 MG PO TB24
500.0000 mg | ORAL_TABLET | Freq: Two times a day (BID) | ORAL | Status: DC
  Administered 2011-09-02 – 2011-09-04 (×6): 500 mg via ORAL
  Filled 2011-09-02 (×7): qty 1

## 2011-09-02 MED ORDER — POLYVINYL ALCOHOL 1.4 % OP SOLN
1.0000 [drp] | Freq: Every day | OPHTHALMIC | Status: DC
  Administered 2011-09-02 – 2011-09-03 (×2): 1 [drp] via OPHTHALMIC
  Filled 2011-09-02: qty 15

## 2011-09-02 MED ORDER — POLYETHYL GLYCOL-PROPYL GLYCOL 0.4-0.3 % OP SOLN: 1.0000 [drp] | Freq: Every day | OPHTHALMIC | Status: DC

## 2011-09-02 MED ORDER — PANTOPRAZOLE SODIUM 40 MG PO TBEC
40.0000 mg | DELAYED_RELEASE_TABLET | Freq: Two times a day (BID) | ORAL | Status: DC
  Administered 2011-09-02 – 2011-09-04 (×6): 40 mg via ORAL
  Filled 2011-09-02 (×7): qty 1

## 2011-09-02 MED ORDER — ENOXAPARIN SODIUM 40 MG/0.4ML ~~LOC~~ SOLN: 40.0000 mg | SUBCUTANEOUS | Status: DC

## 2011-09-02 MED ORDER — DULOXETINE HCL 60 MG PO CPEP
60.0000 mg | ORAL_CAPSULE | Freq: Two times a day (BID) | ORAL | Status: DC
  Administered 2011-09-02 – 2011-09-04 (×6): 60 mg via ORAL
  Filled 2011-09-02 (×7): qty 1

## 2011-09-02 MED ORDER — INSULIN ASPART 100 UNIT/ML ~~LOC~~ SOLN
0.0000 [IU] | Freq: Three times a day (TID) | SUBCUTANEOUS | Status: DC
  Administered 2011-09-02: 2 [IU] via SUBCUTANEOUS
  Administered 2011-09-02 (×2): 3 [IU] via SUBCUTANEOUS
  Administered 2011-09-03 (×2): 2 [IU] via SUBCUTANEOUS
  Filled 2011-09-02: qty 3

## 2011-09-02 MED ORDER — ALBUTEROL (5 MG/ML) CONTINUOUS INHALATION SOLN
10.0000 mg/h | INHALATION_SOLUTION | Freq: Once | RESPIRATORY_TRACT | Status: AC
  Administered 2011-09-02: 10 mg/h via RESPIRATORY_TRACT

## 2011-09-02 MED ORDER — METOCLOPRAMIDE HCL 5 MG PO TABS
5.0000 mg | ORAL_TABLET | Freq: Three times a day (TID) | ORAL | Status: DC
Start: 2011-09-02 — End: 2011-09-04
  Administered 2011-09-02 – 2011-09-04 (×7): 5 mg via ORAL
  Filled 2011-09-02 (×9): qty 1

## 2011-09-02 MED ORDER — CLONIDINE HCL 0.2 MG PO TABS
0.2000 mg | ORAL_TABLET | Freq: Two times a day (BID) | ORAL | Status: DC
Start: 2011-09-02 — End: 2011-09-04
  Administered 2011-09-02 – 2011-09-04 (×5): 0.2 mg via ORAL
  Filled 2011-09-02 (×6): qty 1

## 2011-09-02 MED ORDER — LISINOPRIL 10 MG PO TABS
10.0000 mg | ORAL_TABLET | Freq: Every day | ORAL | Status: DC
  Administered 2011-09-02 – 2011-09-04 (×3): 10 mg via ORAL
  Filled 2011-09-02 (×3): qty 1

## 2011-09-02 MED ORDER — FUROSEMIDE 40 MG PO TABS
40.0000 mg | ORAL_TABLET | Freq: Every day | ORAL | Status: DC
Start: 2011-09-02 — End: 2011-09-04
  Administered 2011-09-02 – 2011-09-04 (×3): 40 mg via ORAL
  Filled 2011-09-02 (×3): qty 1

## 2011-09-02 MED ORDER — ALBUTEROL SULFATE (5 MG/ML) 0.5% IN NEBU
2.5000 mg | INHALATION_SOLUTION | RESPIRATORY_TRACT | Status: DC | PRN
  Administered 2011-09-02 – 2011-09-03 (×2): 2.5 mg via RESPIRATORY_TRACT
  Filled 2011-09-02 (×2): qty 0.5

## 2011-09-02 MED ORDER — IPRATROPIUM BROMIDE 0.02 % IN SOLN
0.5000 mg | RESPIRATORY_TRACT | Status: DC | PRN
  Administered 2011-09-02: 0.5 mg via RESPIRATORY_TRACT
  Filled 2011-09-02: qty 2.5

## 2011-09-02 MED ORDER — ENOXAPARIN SODIUM 60 MG/0.6ML ~~LOC~~ SOLN
60.0000 mg | SUBCUTANEOUS | Status: DC
  Administered 2011-09-02 – 2011-09-04 (×3): 60 mg via SUBCUTANEOUS
  Filled 2011-09-02 (×3): qty 0.6

## 2011-09-02 MED ORDER — ISOSORBIDE MONONITRATE ER 30 MG PO TB24
30.0000 mg | ORAL_TABLET | Freq: Every day | ORAL | Status: DC
Start: 2011-09-02 — End: 2011-09-04
  Administered 2011-09-02 – 2011-09-04 (×3): 30 mg via ORAL
  Filled 2011-09-02 (×3): qty 1

## 2011-09-02 NOTE — H&P (Signed)
PCP:   Unknown  Chief Complaint:  Shortness of breath  HPI: This a 61 year old female resident of [facility] nursing home. Patient is a poor historian. She was brought in for shortness of breath. She is COPD with chronic respiratory failure obstructive sleep apnea, COPD. She has chronic shortness of breath but states it's been getting worse. She reports a cough with yellow sputum and wheezing. She reports fevers and, and edema. She complains of chronic GERD and heartburn, chronic chest pains, stating that it hurts her movement. It's difficult to obtain a time of event this patient mixes current symptoms with old symptoms. History obtained from the patient. Patient alert and conversant.  Per ER physician patient was brought in on a nonrebreater satting 100%. She received Solu-Medrol and nebulizers. Her lungs are initially quiet but with treatment of nebulizer she is now beginning to wheeze.   Review of Systems: Positives bolded  anorexia, fever, weight loss,, vision loss, decreased hearing, hoarseness, chest pain, syncope, shortness of breath, peripheral edema, balance deficits, hemoptysis, abdominal pain, melena, hematochezia, severe indigestion/heartburn, hematuria, incontinence, genital sores, muscle weakness, suspicious skin lesions, transient blindness, difficulty walking, depression, unusual weight change, abnormal bleeding, enlarged lymph nodes, angioedema, and breast masses.  Past Medical History: Past Medical History  Diagnosis Date  . COPD   . Hypertension   . Chronic respiratory failure     3 L oxygen  . GERD (gastroesophageal reflux disease)   . Diabetes mellitus   . Obesity, Class III, BMI 40-49.9 (morbid obesity)   . Obstructive sleep apnea on CPAP   . Peripheral vascular disease   . Bipolar disease, chronic   . Lymphedema    Past Surgical History  Procedure Date  . Abdominal hysterectomy   . Cholecystectomy   . Appendectomy   . Tubal ligation      Medications: Prior to Admission medications   Medication Sig Start Date End Date Taking? Authorizing Provider  ARIPiprazole (ABILIFY) 5 MG tablet Take 5 mg by mouth daily.   Yes Historical Provider, MD  aspirin 325 MG tablet Take 325 mg by mouth daily.   Yes Historical Provider, MD  budesonide-formoterol (SYMBICORT) 160-4.5 MCG/ACT inhaler Inhale 2 puffs into the lungs 2 (two) times daily.   Yes Historical Provider, MD  busPIRone (BUSPAR) 10 MG tablet Take 10 mg by mouth 2 (two) times daily.   Yes Historical Provider, MD  calcium-vitamin D (OSCAL WITH D) 500-200 MG-UNIT per tablet Take 1 tablet by mouth 2 (two) times daily.   Yes Historical Provider, MD  clonazePAM (KLONOPIN) 0.5 MG tablet Take 0.5 mg by mouth daily.   Yes Historical Provider, MD  cloNIDine (CATAPRES) 0.2 MG tablet Take 0.2 mg by mouth 2 (two) times daily.   Yes Historical Provider, MD  divalproex (DEPAKOTE ER) 500 MG 24 hr tablet Take 500 mg by mouth 2 (two) times daily.   Yes Historical Provider, MD  docusate sodium (COLACE) 100 MG capsule Take 100 mg by mouth daily.   Yes Historical Provider, MD  DULoxetine (CYMBALTA) 60 MG capsule Take 60 mg by mouth 2 (two) times daily.   Yes Historical Provider, MD  feeding supplement (PRO-STAT SUGAR FREE 64) LIQD Take 30 mLs by mouth 2 (two) times daily.   Yes Historical Provider, MD  fluticasone (FLONASE) 50 MCG/ACT nasal spray Place 2 sprays into the nose daily.   Yes Historical Provider, MD  folic acid (FOLVITE) 1 MG tablet Take 1 mg by mouth daily.   Yes Historical Provider, MD  furosemide (LASIX) 40 MG tablet Take 40 mg by mouth daily.   Yes Historical Provider, MD  gabapentin (NEURONTIN) 400 MG capsule Take 400 mg by mouth 3 (three) times daily.   Yes Historical Provider, MD  isosorbide mononitrate (IMDUR) 30 MG 24 hr tablet Take 30 mg by mouth daily.   Yes Historical Provider, MD  lisinopril (PRINIVIL,ZESTRIL) 10 MG tablet Take 10 mg by mouth daily.   Yes Historical Provider,  MD  metoCLOPramide (REGLAN) 5 MG tablet Take 5 mg by mouth 3 (three) times daily.   Yes Historical Provider, MD  MORPHINE SULFATE ER PO Take 15 mg by mouth 2 (two) times daily.   Yes Historical Provider, MD  Multiple Vitamins-Minerals (CERTAGEN PO) Take 1 tablet by mouth daily.   Yes Historical Provider, MD  nitroGLYCERIN (NITROSTAT) 0.4 MG SL tablet Place 0.4 mg under the tongue every 5 (five) minutes as needed.   Yes Historical Provider, MD  Olopatadine HCl (PATADAY) 0.2 % SOLN Apply 1 drop to eye daily. In each eye   Yes Historical Provider, MD  oxyCODONE-acetaminophen (PERCOCET) 5-325 MG per tablet Take 1 tablet by mouth every 4 (four) hours as needed. For pain   Yes Historical Provider, MD  pantoprazole (PROTONIX) 40 MG tablet Take 40 mg by mouth 2 (two) times daily.   Yes Historical Provider, MD  Polyethyl Glycol-Propyl Glycol 0.4-0.3 % SOLN Apply 1 drop to eye daily. In both eyes   Yes Historical Provider, MD  senna (SENOKOT) 8.6 MG TABS Take 2 tablets by mouth daily.   Yes Historical Provider, MD  simethicone (MYLICON) 80 MG chewable tablet Chew 80 mg by mouth 3 (three) times daily.   Yes Historical Provider, MD  vitamin C (ASCORBIC ACID) 500 MG tablet Take 500 mg by mouth 2 (two) times daily.   Yes Historical Provider, MD  vitamin D, CHOLECALCIFEROL, 400 UNITS tablet Take 400 Units by mouth daily.   Yes Historical Provider, MD  zolpidem (AMBIEN) 5 MG tablet Take 5 mg by mouth at bedtime as needed. For sleep   Yes Historical Provider, MD    Allergies:   Allergies  Allergen Reactions  . Bee Venom     Not listed on MAR  . Nyquil Cold & (Gnp Night Time)     Not listed on MAR  . Penicillins     REACTION: Reaction not known    Social History:  reports that she has quit smoking. She does not have any smokeless tobacco history on file. She reports that she does not drink alcohol or use illicit drugs. resides at [facility] nursing home, 3 L oxygen around-the-clock, CPAP at night,  wheelchair bound   Family History: Family History  Problem Relation Age of Onset  . Prostate cancer      Physical Exam: Filed Vitals:   09/01/11 2213 09/01/11 2237 09/01/11 2338 09/02/11 0032  BP: 142/67     Pulse: 83     Temp: 98.4 F (36.9 C)     TempSrc: Axillary     Resp: 16     SpO2: 100% 100% 100% 95%    General:  Alert and oriented times three, morbidly obese, no respiratory distress Eyes: PERRLA, pink conjunctiva, no scleral icterus ENT: Moist oral mucosa, neck supple, no thyromegaly Lungs: clear to ascultation, mild wheeze, no crackles, no use of accessory muscles, poor exam due to patient's body habitus  Cardiovascular: regular rate and rhythm, no regurgitation, no gallops, no murmurs. No carotid bruits, no JVD Abdomen: soft, positive  BS, non-tender, obese, non-distended, no organomegaly, not an acute abdomen GU: not examined Neuro: CN II - XII grossly intact, sensation intact Musculoskeletal: strength 3/5 bilateral lower extremities, no clubbing, cyanosis or edema, Skin: no rash, no subcutaneous crepitation, no sacral decubitus, no yeast on the pannus around the breasts, or extremity lymphedema Psych: appropriate patient   Labs on Admission:   Basename 09/01/11 2300  NA 138  K 4.1  CL 93*  CO2 40*  GLUCOSE 105*  BUN 22  CREATININE 1.02  CALCIUM 9.1  MG --  PHOS --  Results for THANDIWE, SIRAGUSA (MRN [id number]) as of 09/02/2011 01:06  Ref. Range 09/01/2011 23:34  Sample type No range found ARTERIAL DRAW  Delivery systems No range found NON-REBREATHER OXYGEN MASK  FIO2 No range found 1.00  pH, Arterial Latest Range: 7.350-7.400  7.334 (L)  pCO2 arterial Latest Range: 35.0-45.0 mmHg 73.5 (HH)  pO2, Arterial Latest Range: 80.0-100.0 mmHg 251.0 (H)  Bicarbonate Latest Range: 20.0-24.0 mEq/L 38.1 (H)  TCO2 Latest Range: 0-100 mmol/L 36.0  Acid-Base Excess Latest Range: 0.0-2.0 mmol/L 10.6 (H)  O2 Saturation No range found 98.7  Patient temperature No  range found 98.6  Collection site No range found RIGHT BRACHIAL  Allens test (pass/fail) Latest Range: PASS  PASS   No results found for this basename: AST:2,ALT:2,ALKPHOS:2,BILITOT:2,PROT:2,ALBUMIN:2 in the last 72 hours No results found for this basename: LIPASE:2,AMYLASE:2 in the last 72 hours  Basename 09/01/11 2300  WBC 5.5  NEUTROABS 2.7  HGB 9.5*  HCT 30.6*  MCV 86.9  PLT 216  Results for NOORAH, GIAMMONA (MRN [id number]) as of 09/02/2011 01:06  Ref. Range 09/01/2011 23:00  Prothrombin Time Latest Range: 11.6-15.2 seconds 12.5  INR Latest Range: 0.00-1.49  0.91  aPTT Latest Range: 24-37 seconds 27   No results found for this basename: CKTOTAL:3,CKMB:3,CKMBINDEX:3,TROPONINI:3 in the last 72 hours No components found with this basename: POCBNP:3 No results found for this basename: DDIMER:2 in the last 72 hours No results found for this basename: HGBA1C:2 in the last 72 hours No results found for this basename: CHOL:2,HDL:2,LDLCALC:2,TRIG:2,CHOLHDL:2,LDLDIRECT:2 in the last 72 hours No results found for this basename: TSH,T4TOTAL,FREET3,T3FREE,THYROIDAB in the last 72 hours No results found for this basename: VITAMINB12:2,FOLATE:2,FERRITIN:2,TIBC:2,IRON:2,RETICCTPCT:2 in the last 72 hours  Micro Results: No results found for this or any previous visit (from the past 240 hour(s)).   Radiological Exams on Admission: Dg Chest 2 View  09/01/2011  *RADIOLOGY REPORT*  Clinical Data: Cough; hypoxia.  Shortness of breath.  Diffuse rhonchi.  CHEST - 2 VIEW  Comparison: Chest radiograph performed 09/16/2010  Findings: The lungs remain hypoexpanded.  There is elevation of the right hemidiaphragm, as on the prior study.  Vascular crowding and vascular congestion are seen.  Given increased interstitial markings, mild interstitial edema may be present.  No definite focal airspace consolidation is seen to suggest pneumonia, though evaluation is suboptimal due to lung hypoexpansion.  No pleural  effusion or pneumothorax is seen.  The cardiomediastinal silhouette is mildly enlarged.  No acute osseous abnormalities are identified.  IMPRESSION:  1.  Lungs remain hypoexpanded; no definite focal airspace consolidation seen to suggest pneumonia, though evaluation is suboptimal due to lung hypoexpansion. 2.  Vascular congestion and mild cardiomegaly; given increased interstitial markings, mild interstitial edema may be present. 3.  Elevation of the right hemidiaphragm again noted.  Original Report Authenticated By: Tonia Ghent, M.D.    EKG normal sinus rhythm  Assessment/Plan Present on Admission:  .Acute and chronic respiratory failure (acute-on-chronic) .  OBSTRUCTIVE SLEEP APNEA . COPD  .Morbid obesity BMI greater than 40  Admit to telemetry Patient respiratory status already improving. Currently on nasal cannula. Keep sats approximately 88%  CPAP machine ordered at bedtime Solu-Medrol, nebulizers, Tessalon Perles ordered  Careful of hyperglycemia with patient's history of diabetes  Single dose of Lasix will be ordered  Hypercapnia - chronic On review patient's old ABGs, patient's PCO2 has been in the 70s. Continue oxygen to keep sats approximately 88%. CPAP at night for sleep. Patient alert, interactive and talking Diabetes mellitus Peripheral vascular disease  .G E R D .BIPOLAR DISORDER UNSPECIFIED .HYPERTENSION .OTHER NONINFECTIOUS LYMPHEDEMA Stable resume home medications  [facility] consulted to consider upper extremity wrapping for lymphedema ADA diet insulin scale insulin Depakote levels ordered for the a.m.   DVT prophylaxis T1/Dr. Sammuel Cooper, Caelie [place] 09/02/2011, 1:02 AM

## 2011-09-02 NOTE — Progress Notes (Signed)
CSW spoke with patient re: discharge planning. Patient is from [facility] where she plans to return at discharge. CSW spoke with [facility] @ [facility] who stated that they are unable to take patient back until Sunday due to facility switching out her bariatric bed and won't have a replacement until Sunday. CSW will facility d/c back to [facility] Sunday am. MD aware.   Unice Bailey, LCSWA [phone]

## 2011-09-02 NOTE — ED Notes (Signed)
patient receiving continuous nebs O2 sats are 99% at present

## 2011-09-02 NOTE — ED Notes (Signed)
Solumedrol 40 mg held due to the fact that the patient received 125 mg at 2305

## 2011-09-02 NOTE — Progress Notes (Signed)
ANTIBIOTIC CONSULT NOTE - INITIAL  Pharmacy Consult for Vancomycin Indication: Gram + Cocci in Blood  Allergies  Allergen Reactions  . Bee Venom     Not listed on MAR  . Nyquil Cold & (Gnp Night Time)     Not listed on MAR  . Penicillins     REACTION: Reaction not known    Patient Measurements: Height: 5\' 1"  (154.9 cm) Weight: 290 lb 14.4 oz (131.951 kg) IBW/kg (Calculated) : 47.8    Vital Signs: Temp: 98.5 F (36.9 C) (03/01 2132) Temp src: Oral (03/01 2132) BP: 176/98 mmHg (03/01 2132) Pulse Rate: 92  (03/01 2132) Intake/Output from previous day: 02/28 0701 - 03/01 0700 In: 120 [P.O.:120] Out: 900 [Urine:900] Intake/Output from this shift: Total I/O In: -  Out: 650 [Urine:650]  Labs:  [facility] 09/02/11 0635 09/01/11 2300  WBC 6.0 5.5  HGB 10.5* 9.5*  PLT PLATELET CLUMPS NOTED ON SMEAR, COUNT APPEARS ADEQUATE 216  LABCREA -- --  CREATININE 0.92 1.02   Estimated Creatinine Clearance: 81.6 ml/min (by C-G formula based on Cr of 0.92). No results found for this basename: VANCOTROUGH:2,VANCOPEAK:2,VANCORANDOM:2,GENTTROUGH:2,GENTPEAK:2,GENTRANDOM:2,TOBRATROUGH:2,TOBRAPEAK:2,TOBRARND:2,AMIKACINPEAK:2,AMIKACINTROU:2,AMIKACIN:2, in the last 72 hours   Microbiology: Recent Results (from the past 720 hour(s))  CULTURE, BLOOD (ROUTINE X 2)     Status: Normal (Preliminary result)   Collection Time   09/01/11 11:40 PM      Component Value Range Status Comment   Specimen Description BLOOD LEFT ANTECUBITAL   Final    Special Requests BOTTLES DRAWN AEROBIC AND ANAEROBIC 10 CC EA   Final    Culture  Setup Time [id number]   Final    Culture     Final    Value: GRAM POSITIVE COCCI IN PAIRS AND CHAINS     Note: Gram Stain Report Called to,Read Back By and Verified With: SHAWNA SPENCER @ 2133 ON 09/02/11 BY GOLLD   Report Status PENDING   Incomplete   MRSA PCR SCREENING     Status: Normal   Collection Time   09/02/11  5:21 AM      Component Value Range Status Comment   MRSA  by PCR NEGATIVE  NEGATIVE  Final     Medical History: Past Medical History  Diagnosis Date  . COPD   . Hypertension   . Chronic respiratory failure     3 L oxygen  . GERD (gastroesophageal reflux disease)   . Diabetes mellitus   . Obesity, Class III, BMI 40-49.9 (morbid obesity)   . Obstructive sleep apnea on CPAP   . Peripheral vascular disease   . Bipolar disease, chronic   . Lymphedema     Medications:  Scheduled:    . albuterol  10 mg/hr Nebulization Once  . ARIPiprazole  5 mg Oral Daily  . aspirin  325 mg Oral Daily  . busPIRone  10 mg Oral BID  . cloNIDine  0.2 mg Oral BID  . divalproex  500 mg Oral BID  . DULoxetine  60 mg Oral BID  . enoxaparin (LOVENOX) injection  60 mg Subcutaneous Q24H  . feeding supplement  30 mL Oral BID  . furosemide  20 mg Intravenous Once  . furosemide  40 mg Oral Daily  . gabapentin  400 mg Oral TID  . insulin aspart  0-15 Units Subcutaneous TID WC  . insulin aspart  0-5 Units Subcutaneous QHS  . ipratropium  0.5 mg Nebulization Once  . isosorbide mononitrate  30 mg Oral Daily  . lisinopril  10 mg Oral Daily  .  methylPREDNISolone (SOLU-MEDROL) injection  125 mg Intravenous Once  . metoCLOPramide  5 mg Oral TID  . olopatadine  1 drop Both Eyes Daily  . pantoprazole  40 mg Oral BID  . polyvinyl alcohol  1 drop Both Eyes Daily  . predniSONE  60 mg Oral Q breakfast  . simethicone  80 mg Oral TID  . DISCONTD: enoxaparin  40 mg Subcutaneous Q24H  . DISCONTD: methylPREDNISolone (SOLU-MEDROL) injection  40 mg Intravenous Q12H  . DISCONTD: Olopatadine HCl  1 drop Ophthalmic Daily  . DISCONTD: Polyethyl Glycol-Propyl Glycol  1 drop Ophthalmic Daily   Infusions:   PRN: acetaminophen, albuterol, benzonatate, cloNIDine, ipratropium, morphine, nitroGLYCERIN, sodium chloride, DISCONTD: albuterol Assessment:  62 yo obese F from SNF admitted today with COPD exacerbation, now with blood cultures positive for gram + cocci to start on empiric  vancomycin    CrCl Normalized = 74 ml/min  Goal of Therapy:  Vancomycin trough level 15-20 mcg/ml  Plan:  1.) Vancomycin 2000 mg IV x 1, then start 1000 mg IV q12h 2.) Monitor renal function, check vancomycin troughs when appropriate 3.) follow up final culture  Tennyson Wacha, Loma Messing PharmD 10:26 PM 09/02/2011

## 2011-09-02 NOTE — ED Notes (Signed)
Dr Joneen Roach here to admit patient

## 2011-09-02 NOTE — Progress Notes (Signed)
Patient admitted earlier today for a mild COPD exacerbation. She has chronic resp failure with a pCO2 in the 70s, 2/2 COPD, OSA/OHS, morbid obesity. Is doing much better. Will change to PO steroids and start titrating. Continue nebs. Plan is for DC back to SNF in am. SW aware.

## 2011-09-02 NOTE — ED Provider Notes (Signed)
History     CSN: [id number]  Arrival date & time 09/01/11  2200   First MD Initiated Contact with Patient 09/01/11 2228      Chief Complaint  Patient presents with  . Shortness of Breath    (Consider location/radiation/quality/duration/timing/severity/associated sxs/prior treatment) Patient is a 61 y.o. female presenting with shortness of breath. The history is provided by the patient, the nursing home, medical records and the EMS personnel.  Shortness of Breath  The current episode started 3 to 5 days ago. The onset was gradual. The problem occurs continuously. The problem has been gradually worsening. The problem is moderate (Moderate to severe). The symptoms are relieved by nothing. The symptoms are aggravated by activity. Associated symptoms include cough, shortness of breath and wheezing. Pertinent negatives include no chest pain, no chest pressure, no orthopnea, no fever, no rhinorrhea, no sore throat and no stridor. The cough has no precipitants. The cough is productive. Color change with cough: Green brown. The cough is relieved by beta-agonist inhalers and rest (Increased levels of supplemental oxygen). She is currently using steroids. Her past medical history is significant for asthma and past wheezing. Sick contacts: The patient lives in a nursing home. She has received no recent medical care.    Past Medical History  Diagnosis Date  . SOB (shortness of breath)     No past surgical history on file.  No family history on file.  History  Substance Use Topics  . Smoking status: Not on file  . Smokeless tobacco: Not on file  . Alcohol Use: Not on file    OB History    Grav Para Term Preterm Abortions TAB SAB Ect Mult Living                  Review of Systems  Constitutional: Positive for fatigue. Negative for fever, chills and diaphoresis.  HENT: Negative for ear pain, congestion, sore throat and rhinorrhea.   Eyes: Negative.   Respiratory: Positive for cough,  shortness of breath and wheezing. Negative for stridor.   Cardiovascular: Positive for leg swelling. Negative for chest pain, palpitations and orthopnea.  Gastrointestinal: Negative for nausea, vomiting and abdominal pain.  Genitourinary: Negative.   Musculoskeletal: Negative.   Skin: Negative for color change and pallor.  Neurological: Negative for syncope, light-headedness and headaches.  Hematological: Does not bruise/bleed easily.  Psychiatric/Behavioral: Negative for confusion.    Allergies  Bee venom; Nyquil cold &; and Penicillins  Home Medications   Current Outpatient Rx  Name Route Sig Dispense Refill  . ARIPIPRAZOLE 5 MG PO TABS Oral Take 5 mg by mouth daily.    . ASPIRIN 325 MG PO TABS Oral Take 325 mg by mouth daily.    . BUDESONIDE-FORMOTEROL FUMARATE 160-4.5 MCG/ACT IN AERO Inhalation Inhale 2 puffs into the lungs 2 (two) times daily.    . BUSPIRONE HCL 10 MG PO TABS Oral Take 10 mg by mouth 2 (two) times daily.    Marland Kitchen CALCIUM CARBONATE-VITAMIN D 500-200 MG-UNIT PO TABS Oral Take 1 tablet by mouth 2 (two) times daily.    Marland Kitchen CLONAZEPAM 0.5 MG PO TABS Oral Take 0.5 mg by mouth daily.    Marland Kitchen CLONIDINE HCL 0.2 MG PO TABS Oral Take 0.2 mg by mouth 2 (two) times daily.    Marland Kitchen DIVALPROEX SODIUM ER 500 MG PO TB24 Oral Take 500 mg by mouth 2 (two) times daily.    Marland Kitchen DOCUSATE SODIUM 100 MG PO CAPS Oral Take 100 mg by mouth daily.    Marland Kitchen  DULOXETINE HCL 60 MG PO CPEP Oral Take 60 mg by mouth 2 (two) times daily.    Marland Kitchen PRO-STAT 64 PO LIQD Oral Take 30 mLs by mouth 2 (two) times daily.    Marland Kitchen FLUTICASONE PROPIONATE 50 MCG/ACT NA SUSP Nasal Place 2 sprays into the nose daily.    Marland Kitchen FOLIC ACID 1 MG PO TABS Oral Take 1 mg by mouth daily.    . FUROSEMIDE 40 MG PO TABS Oral Take 40 mg by mouth daily.    Marland Kitchen GABAPENTIN 400 MG PO CAPS Oral Take 400 mg by mouth 3 (three) times daily.    . ISOSORBIDE MONONITRATE ER 30 MG PO TB24 Oral Take 30 mg by mouth daily.    Marland Kitchen LISINOPRIL 10 MG PO TABS Oral Take 10 mg  by mouth daily.    Marland Kitchen METOCLOPRAMIDE HCL 5 MG PO TABS Oral Take 5 mg by mouth 3 (three) times daily.    . MORPHINE SULFATE ER PO Oral Take 15 mg by mouth 2 (two) times daily.    . CERTAGEN PO Oral Take 1 tablet by mouth daily.    Marland Kitchen NITROGLYCERIN 0.4 MG SL SUBL Sublingual Place 0.4 mg under the tongue every 5 (five) minutes as needed.    . OLOPATADINE HCL 0.2 % OP SOLN Ophthalmic Apply 1 drop to eye daily. In each eye    . OXYCODONE-ACETAMINOPHEN 5-325 MG PO TABS Oral Take 1 tablet by mouth every 4 (four) hours as needed. For pain    . PANTOPRAZOLE SODIUM 40 MG PO TBEC Oral Take 40 mg by mouth 2 (two) times daily.    Marland Kitchen POLYETHYL GLYCOL-PROPYL GLYCOL 0.4-0.3 % OP SOLN Ophthalmic Apply 1 drop to eye daily. In both eyes    . SENNA 8.6 MG PO TABS Oral Take 2 tablets by mouth daily.    Marland Kitchen SIMETHICONE 80 MG PO CHEW Oral Chew 80 mg by mouth 3 (three) times daily.    Marland Kitchen VITAMIN C 500 MG PO TABS Oral Take 500 mg by mouth 2 (two) times daily.    Marland Kitchen VITAMIN D 400 UNITS PO TABS Oral Take 400 Units by mouth daily.    Marland Kitchen ZOLPIDEM TARTRATE 5 MG PO TABS Oral Take 5 mg by mouth at bedtime as needed. For sleep      BP 142/67  Pulse 83  Temp(Src) 98.4 F (36.9 C) (Axillary)  Resp 16  SpO2 100%  Physical Exam  Nursing note and vitals reviewed. Constitutional: She is oriented to person, place, and time. She appears distressed.       The patient is morbidly obese  HENT:  Head: Normocephalic and atraumatic.  Right Ear: External ear normal.  Left Ear: External ear normal.  Nose: Nose normal.  Mouth/Throat: Oropharynx is clear and moist. No oropharyngeal exudate.  Eyes: Conjunctivae and EOM are normal. Pupils are equal, round, and reactive to light. Right eye exhibits no discharge. Left eye exhibits no discharge.  Neck: Normal range of motion. Neck supple. No tracheal deviation present.       Do to morbid obesity and body habitus, I am unable to evaluate for jugular venous distention  Cardiovascular: Normal  rate, regular rhythm, normal heart sounds and intact distal pulses.  Exam reveals no gallop and no friction rub.   No murmur heard. Pulmonary/Chest: No accessory muscle usage or stridor. Tachypnea noted. She is in respiratory distress. She has decreased breath sounds in the right upper field, the right middle field, the right lower field, the left upper  field, the left middle field and the left lower field. She has wheezes in the right upper field, the right middle field, the right lower field, the left upper field, the left middle field and the left lower field. She has rhonchi in the right upper field, the right middle field, the right lower field, the left upper field, the left middle field and the left lower field. She has no rales. She exhibits no tenderness.  Abdominal: Soft. Bowel sounds are normal. She exhibits no distension. There is no tenderness. There is no rebound and no guarding.  Musculoskeletal: Normal range of motion. She exhibits edema. She exhibits no tenderness.       Left lower extremity is wrapped in compression bandage  Lymphadenopathy:    She has no cervical adenopathy.  Neurological: She is alert and oriented to person, place, and time. No cranial nerve deficit.  Skin: Skin is warm and dry. No rash noted. She is not diaphoretic. No erythema. No pallor.  Psychiatric: She has a normal mood and affect. Her behavior is normal. Thought content normal.    ED Course  Procedures (including critical care time)   Date: 09/02/2011  Rate: 84  Rhythm: normal sinus rhythm  QRS Axis: normal  Intervals: normal  ST/T Wave abnormalities: normal  Conduction Disutrbances:none  Narrative Interpretation: Non-provocative EKG  Old EKG Reviewed: No significant changes    Labs Reviewed  BLOOD GAS, ARTERIAL - Abnormal; Notable for the following:    pH, Arterial 7.334 (*)    pCO2 arterial 73.5 (*)    pO2, Arterial 251.0 (*)    Bicarbonate 38.1 (*)    Acid-Base Excess 10.6 (*)    All  other components within normal limits  CBC - Abnormal; Notable for the following:    RBC 3.52 (*)    Hemoglobin 9.5 (*)    HCT 30.6 (*)    All other components within normal limits  BASIC METABOLIC PANEL - Abnormal; Notable for the following:    Chloride 93 (*)    CO2 40 (*)    Glucose, Bld 105 (*)    GFR calc non Af Amer 58 (*)    GFR calc Af Amer 67 (*)    All other components within normal limits  PROTIME-INR  APTT  PRO B NATRIURETIC PEPTIDE  DIFFERENTIAL  POCT I-STAT TROPONIN I  CULTURE, BLOOD (ROUTINE X 2)  CULTURE, BLOOD (ROUTINE X 2)   Dg Chest 2 View  09/01/2011  *RADIOLOGY REPORT*  Clinical Data: Cough; hypoxia.  Shortness of breath.  Diffuse rhonchi.  CHEST - 2 VIEW  Comparison: Chest radiograph performed 09/16/2010  Findings: The lungs remain hypoexpanded.  There is elevation of the right hemidiaphragm, as on the prior study.  Vascular crowding and vascular congestion are seen.  Given increased interstitial markings, mild interstitial edema may be present.  No definite focal airspace consolidation is seen to suggest pneumonia, though evaluation is suboptimal due to lung hypoexpansion.  No pleural effusion or pneumothorax is seen.  The cardiomediastinal silhouette is mildly enlarged.  No acute osseous abnormalities are identified.  IMPRESSION:  1.  Lungs remain hypoexpanded; no definite focal airspace consolidation seen to suggest pneumonia, though evaluation is suboptimal due to lung hypoexpansion. 2.  Vascular congestion and mild cardiomegaly; given increased interstitial markings, mild interstitial edema may be present. 3.  Elevation of the right hemidiaphragm again noted.  Original Report Authenticated By: Tonia Ghent, M.D.     No diagnosis found.    MDM  Asthma exacerbation,  COPD, congestive heart failure, pneumonia, bronchitis, viral illness, anemia, arrhythmia, myocardial infarction are all entertained in the patient's differential diagnosis amongst other etiologies  of her symptoms. Findings are most compatible with asthma and/or COPD exacerbation, as the ABG shows chronic CO2 retention with a mild acute increase in CO2 suggested over baseline due to the pH of 7.33. The patient is awake, alert, and oriented and speaking in full sentences and breathing much more easily after initial breathing treatments with bronchodilators and administration of corticosteroids. Air exchange is now improving in the lung fields which is bringing out more wheezing than was initially heard to 2 prior diminished air exchange. The patient has been taken from a non-breather mask down to nasal cannula oxygen with maintenance of appropriate oxygen saturation. She has no chest pain and no tachycardia. She shows no evidence of myocardial infarction or ischemia on cardiac enzyme or EKG, and her chest x-ray shows no focal pneumonia along with a normal white blood cell count and lack of fever. For these reasons, the there is no apparent pneumonia at this time and so no antibiotics have been administered. Radiology reads that this may be congestive heart failure related, however if the patient's pro B. natruretic peptide is not elevated. At this time my impression is that of asthma and/or COPD exacerbation which is improving but still severe enough that the patient will need admission to the hospital.        Felisa Bonier, MD 09/02/11 [phone]

## 2011-09-02 NOTE — ED Notes (Signed)
Patient sleeping soundly sats At 99%

## 2011-09-02 NOTE — Consult Note (Signed)
WOC consult Note Reason for Consult: Consult requested for left lower extremity.  Pt has chronic full thickness stasis ulcer which is followed by the outpatient wound care center with [facility] boots which are changed 2 times a week. Wound type: Full thickness Measurement: 7X5X.1cm Wound bed: 100% red,  No odor, small yellow drainage Dressing procedure/placement/frequency: Will continue present plan of care with ortho tech to apply left Una boot and change Q Friday and Tuesday while inpatient.  Pt can resume follow-up with wound care center after discharge for further assessment. Will not plan to follow further unless re-consulted.  [address], RN, MSN, [employer]  [phone]

## 2011-09-02 NOTE — ED Notes (Signed)
Solumedrol 40 mg q 12 hrs held because patient agot solumedrol at 22

## 2011-09-03 DIAGNOSIS — R7881 Bacteremia: Secondary | ICD-10-CM

## 2011-09-03 LAB — GLUCOSE, CAPILLARY
Glucose-Capillary: 144 mg/dL — ABNORMAL HIGH (ref 70–99)
Glucose-Capillary: 136 mg/dL — ABNORMAL HIGH (ref 70–99)
Glucose-Capillary: 116 mg/dL — ABNORMAL HIGH (ref 70–99)

## 2011-09-03 LAB — CBC
Hemoglobin: 9.7 g/dL — ABNORMAL LOW (ref 12.0–15.0)
MCH: 26.4 pg (ref 26.0–34.0)
MCHC: 30.9 g/dL (ref 30.0–36.0)

## 2011-09-03 LAB — BASIC METABOLIC PANEL
BUN: 23 mg/dL (ref 6–23)
Calcium: 9.3 mg/dL (ref 8.4–10.5)
Creatinine, Ser: 0.93 mg/dL (ref 0.50–1.10)
GFR calc non Af Amer: 65 mL/min — ABNORMAL LOW (ref >90)
Glucose, Bld: 101 mg/dL — ABNORMAL HIGH (ref 70–99)
Potassium: 3.7 mEq/L (ref 3.5–5.1)

## 2011-09-03 MED ORDER — LORAZEPAM 2 MG/ML IJ SOLN
1.0000 mg | Freq: Four times a day (QID) | INTRAMUSCULAR | Status: DC | PRN
  Administered 2011-09-03 (×2): 1 mg via INTRAVENOUS
  Filled 2011-09-03 (×2): qty 1

## 2011-09-03 MED ORDER — ALBUTEROL SULFATE (5 MG/ML) 0.5% IN NEBU
2.5000 mg | INHALATION_SOLUTION | Freq: Four times a day (QID) | RESPIRATORY_TRACT | Status: DC
  Administered 2011-09-03 – 2011-09-04 (×4): 2.5 mg via RESPIRATORY_TRACT
  Filled 2011-09-03 (×5): qty 0.5

## 2011-09-03 MED ORDER — SODIUM CHLORIDE 0.9 % IV SOLN
1250.0000 mg | Freq: Two times a day (BID) | INTRAVENOUS | Status: DC
  Administered 2011-09-04 (×2): 1250 mg via INTRAVENOUS
  Filled 2011-09-03 (×3): qty 1250

## 2011-09-03 MED ORDER — PREDNISONE 50 MG PO TABS
50.0000 mg | ORAL_TABLET | Freq: Every day | ORAL | Status: DC
  Administered 2011-09-04: 50 mg via ORAL
  Filled 2011-09-03: qty 1

## 2011-09-03 NOTE — Progress Notes (Signed)
Spoke with patient regarding cpap.  Pt stated she has not worn her home cpap in over a year.  Pt said that it made her feel like she can't catch her breath and does not want to try it here despite encouragement to do so.  Pt advised that RT available all night should she change her mind to let her nurse know.  RN notified

## 2011-09-03 NOTE — Progress Notes (Addendum)
Subjective: No complaints.  Objective: Vital signs in last 24 hours: Temp:  [98.5 F (36.9 C)-98.7 F (37.1 C)] 98.7 F (37.1 C) (03/02 0623) Pulse Rate:  [71-92] 71  (03/02 0623) Resp:  [16-18] 18  (03/02 0623) BP: (151-176)/(78-98) 152/80 mmHg (03/02 0623) SpO2:  [94 %-99 %] 98 % (03/02 0758) Weight:  [129.91 kg (286 lb 6.4 oz)] 129.91 kg (286 lb 6.4 oz) (03/02 0623) Weight change: -2.041 kg (-4 lb 8 oz) Last BM Date: 09/02/11  Intake/Output from previous day: 03/01 0701 - 03/02 0700 In: 1143 [P.O.:1140; I.V.:3] Out: 3600 [Urine:3600] Total I/O In: 360 [P.O.:360] Out: 1300 [Urine:1300]   Physical Exam: General: Alert, awake, oriented x3, in no acute distress, obese. HEENT: No bruits, no goiter. Heart: Regular rate and rhythm, without murmurs, rubs, gallops. Lungs: Clear to auscultation bilaterally. Abdomen: Obese, soft, nontender, nondistended, positive bowel sounds. Extremities: No clubbing cyanosis or edema with positive pedal pulses. Neuro: Grossly intact, nonfocal.    Lab Results: Basic Metabolic Panel:  Basename 09/03/11 0513 09/02/11 0635  NA 139 138  K 3.7 4.5  CL 95* 92*  CO2 39* 39*  GLUCOSE 101* 159*  BUN 23 19  CREATININE 0.93 0.92  CALCIUM 9.3 9.4  MG -- --  PHOS -- --   CBC:  Basename 09/03/11 0513 09/02/11 0635 09/01/11 2300  WBC 6.8 6.0 --  NEUTROABS -- -- 2.7  HGB 9.7* 10.5* --  HCT 31.4* 31.8* --  MCV 85.6 85.7 --  PLT 214 PLATELET CLUMPS NOTED ON SMEAR, COUNT APPEARS ADEQUATE --   BNP:  Basename 09/01/11 2300  PROBNP 63.5   CBG:  Basename 09/03/11 1204 09/03/11 0805 09/03/11 0024 09/02/11 2122 09/02/11 1654 09/02/11 1104  GLUCAP 144* 99 132* 161* 129* 163*   Coagulation:  Basename 09/01/11 2300  LABPROT 12.5  INR 0.91    Recent Results (from the past 240 hour(s))  CULTURE, BLOOD (ROUTINE X 2)     Status: Normal (Preliminary result)   Collection Time   09/01/11 11:00 PM      Component Value Range Status Comment   Specimen Description BLOOD RIGHT HAND   Final    Special Requests BOTTLES DRAWN AEROBIC AND ANAEROBIC 10CC   Final    Culture  Setup Time [id number]   Final    Culture     Final    Value:        BLOOD CULTURE RECEIVED NO GROWTH TO DATE CULTURE WILL BE HELD FOR 5 DAYS BEFORE ISSUING A FINAL NEGATIVE REPORT   Report Status PENDING   Incomplete   CULTURE, BLOOD (ROUTINE X 2)     Status: Normal (Preliminary result)   Collection Time   09/01/11 11:40 PM      Component Value Range Status Comment   Specimen Description BLOOD LEFT ANTECUBITAL   Final    Special Requests BOTTLES DRAWN AEROBIC AND ANAEROBIC 10 CC EA   Final    Culture  Setup Time [id number]   Final    Culture     Final    Value: GRAM POSITIVE COCCI IN PAIRS AND CHAINS     Note: Gram Stain Report Called to,Read Back By and Verified With: SHAWNA SPENCER @ 2133 ON 09/02/11 BY GOLLD   Report Status PENDING   Incomplete   MRSA PCR SCREENING     Status: Normal   Collection Time   09/02/11  5:21 AM      Component Value Range Status Comment   MRSA by PCR NEGATIVE  NEGATIVE  Final     Studies/Results: Dg Chest 2 View  09/01/2011  *RADIOLOGY REPORT*  Clinical Data: Cough; hypoxia.  Shortness of breath.  Diffuse rhonchi.  CHEST - 2 VIEW  Comparison: Chest radiograph performed 09/16/2010  Findings: The lungs remain hypoexpanded.  There is elevation of the right hemidiaphragm, as on the prior study.  Vascular crowding and vascular congestion are seen.  Given increased interstitial markings, mild interstitial edema may be present.  No definite focal airspace consolidation is seen to suggest pneumonia, though evaluation is suboptimal due to lung hypoexpansion.  No pleural effusion or pneumothorax is seen.  The cardiomediastinal silhouette is mildly enlarged.  No acute osseous abnormalities are identified.  IMPRESSION:  1.  Lungs remain hypoexpanded; no definite focal airspace consolidation seen to suggest pneumonia, though evaluation is suboptimal  due to lung hypoexpansion. 2.  Vascular congestion and mild cardiomegaly; given increased interstitial markings, mild interstitial edema may be present. 3.  Elevation of the right hemidiaphragm again noted.  Original Report Authenticated By: Tonia Ghent, M.D.    Medications: Scheduled Meds:   . albuterol  2.5 mg Nebulization Q6H  . ARIPiprazole  5 mg Oral Daily  . aspirin  325 mg Oral Daily  . busPIRone  10 mg Oral BID  . cloNIDine  0.2 mg Oral BID  . divalproex  500 mg Oral BID  . DULoxetine  60 mg Oral BID  . enoxaparin (LOVENOX) injection  60 mg Subcutaneous Q24H  . feeding supplement  30 mL Oral BID  . furosemide  40 mg Oral Daily  . gabapentin  400 mg Oral TID  . insulin aspart  0-15 Units Subcutaneous TID WC  . insulin aspart  0-5 Units Subcutaneous QHS  . isosorbide mononitrate  30 mg Oral Daily  . lisinopril  10 mg Oral Daily  . metoCLOPramide  5 mg Oral TID  . olopatadine  1 drop Both Eyes Daily  . pantoprazole  40 mg Oral BID  . polyvinyl alcohol  1 drop Both Eyes Daily  . predniSONE  60 mg Oral Q breakfast  . simethicone  80 mg Oral TID  . vancomycin  1,250 mg Intravenous Q12H  . vancomycin  2,000 mg Intravenous Once  . DISCONTD: methylPREDNISolone (SOLU-MEDROL) injection  40 mg Intravenous Q12H  . DISCONTD: vancomycin  1,000 mg Intravenous Q12H   Continuous Infusions:  PRN Meds:.acetaminophen, albuterol, benzonatate, cloNIDine, ipratropium, LORazepam, morphine, nitroGLYCERIN, sodium chloride  Assessment/Plan:  Active Problems:  Morbid obesity  BIPOLAR DISORDER UNSPECIFIED  OBSTRUCTIVE SLEEP APNEA  HYPERTENSION  OTHER NONINFECTIOUS LYMPHEDEMA  Chronic respiratory failure  G E R D  Acute and chronic respiratory failure (acute-on-chronic)  Diabetes mellitus   #1 Acute on Chronic Respiratory Failure: 2/2 COPD with acute exacerbation: No wheezes on exam. Continue steroid titration.  #2 Bacteremia: 1/2 blood cultures growing GPC  In pairs and chains. Patient  is non-toxic. ?contaminant. Will await speciation.  Rest of chronic issues are stable. Plan for DC back to SNF in am.   LOS: 2 days   [facility] Pager: [phone] 09/03/2011, 1:14 PM

## 2011-09-04 MED ORDER — ALBUTEROL SULFATE (2.5 MG/3ML) 0.083% IN NEBU: 2.5000 mg | INHALATION_SOLUTION | Freq: Four times a day (QID) | RESPIRATORY_TRACT | Status: DC | PRN

## 2011-09-04 MED ORDER — TIOTROPIUM BROMIDE MONOHYDRATE 18 MCG IN CAPS: 18.0000 ug | ORAL_CAPSULE | Freq: Every day | RESPIRATORY_TRACT | Status: DC

## 2011-09-04 MED ORDER — PREDNISONE 50 MG PO TABS: ORAL_TABLET | ORAL | Status: AC

## 2011-09-04 NOTE — Discharge Summary (Signed)
Physician Discharge Summary  Patient ID: [name] MRN: [id number] DOB/AGE: December 10, 1950 61 y.o.  Admit date: 09/01/2011 Discharge date: 09/04/2011  Primary Care Physician:  No primary provider on file.   Discharge Diagnoses:    Active Problems:  Morbid obesity  BIPOLAR DISORDER UNSPECIFIED  OBSTRUCTIVE SLEEP APNEA  HYPERTENSION  OTHER NONINFECTIOUS LYMPHEDEMA  Chronic respiratory failure  G E R D  Acute and chronic respiratory failure (acute-on-chronic)  Diabetes mellitus  Bacteremia    Medication List  As of 09/04/2011  9:35 AM   TAKE these medications         albuterol (2.5 MG/3ML) 0.083% nebulizer solution   Commonly known as: PROVENTIL   Take 3 mLs (2.5 mg total) by nebulization every 6 (six) hours as needed for wheezing.      ARIPiprazole 5 MG tablet   Commonly known as: ABILIFY   Take 5 mg by mouth daily.      aspirin 325 MG tablet   Take 325 mg by mouth daily.      budesonide-formoterol 160-4.5 MCG/ACT inhaler   Commonly known as: SYMBICORT   Inhale 2 puffs into the lungs 2 (two) times daily.      busPIRone 10 MG tablet   Commonly known as: BUSPAR   Take 10 mg by mouth 2 (two) times daily.      calcium-vitamin D 500-200 MG-UNIT per tablet   Commonly known as: OSCAL WITH D   Take 1 tablet by mouth 2 (two) times daily.      CERTAGEN PO   Take 1 tablet by mouth daily.      clonazePAM 0.5 MG tablet   Commonly known as: KLONOPIN   Take 0.5 mg by mouth daily.      cloNIDine 0.2 MG tablet   Commonly known as: CATAPRES   Take 0.2 mg by mouth 2 (two) times daily.      divalproex 500 MG 24 hr tablet   Commonly known as: DEPAKOTE ER   Take 500 mg by mouth 2 (two) times daily.      docusate sodium 100 MG capsule   Commonly known as: COLACE   Take 100 mg by mouth daily.      DULoxetine 60 MG capsule   Commonly known as: CYMBALTA   Take 60 mg by mouth 2 (two) times daily.      feeding supplement Liqd   Take 30 mLs by mouth 2 (two) times daily.      fluticasone 50 MCG/ACT nasal spray   Commonly known as: FLONASE   Place 2 sprays into the nose daily.      folic acid 1 MG tablet   Commonly known as: FOLVITE   Take 1 mg by mouth daily.      furosemide 40 MG tablet   Commonly known as: LASIX   Take 40 mg by mouth daily.      gabapentin 400 MG capsule   Commonly known as: NEURONTIN   Take 400 mg by mouth 3 (three) times daily.      isosorbide mononitrate 30 MG 24 hr tablet   Commonly known as: IMDUR   Take 30 mg by mouth daily.      lisinopril 10 MG tablet   Commonly known as: PRINIVIL,ZESTRIL   Take 10 mg by mouth daily.      metoCLOPramide 5 MG tablet   Commonly known as: REGLAN   Take 5 mg by mouth 3 (three) times daily.      MORPHINE SULFATE ER  PO   Take 15 mg by mouth 2 (two) times daily.      nitroGLYCERIN 0.4 MG SL tablet   Commonly known as: NITROSTAT   Place 0.4 mg under the tongue every 5 (five) minutes as needed.      oxyCODONE-acetaminophen 5-325 MG per tablet   Commonly known as: PERCOCET   Take 1 tablet by mouth every 4 (four) hours as needed. For pain      pantoprazole 40 MG tablet   Commonly known as: PROTONIX   Take 40 mg by mouth 2 (two) times daily.      PATADAY 0.2 % Soln   Generic drug: Olopatadine HCl   Apply 1 drop to eye daily. In each eye      Polyethyl Glycol-Propyl Glycol 0.4-0.3 % Soln   Apply 1 drop to eye daily. In both eyes      predniSONE 50 MG tablet   Commonly known as: DELTASONE   Decrease by 10 mg daily until off.      senna 8.6 MG Tabs   Commonly known as: SENOKOT   Take 2 tablets by mouth daily.      simethicone 80 MG chewable tablet   Commonly known as: MYLICON   Chew 80 mg by mouth 3 (three) times daily.      tiotropium 18 MCG inhalation capsule   Commonly known as: SPIRIVA   Place 1 capsule (18 mcg total) into inhaler and inhale daily.      vitamin C 500 MG tablet   Commonly known as: ASCORBIC ACID   Take 500 mg by mouth 2 (two) times daily.       vitamin D (CHOLECALCIFEROL) 400 UNITS tablet   Take 400 Units by mouth daily.      zolpidem 5 MG tablet   Commonly known as: AMBIEN   Take 5 mg by mouth at bedtime as needed. For sleep             Disposition and Follow-up:  Will be discharged today back to SNF in stable and improved condition.  Consults:  None    Significant Diagnostic Studies:  Dg Chest 2 View  09/01/2011  *RADIOLOGY REPORT*  Clinical Data: Cough; hypoxia.  Shortness of breath.  Diffuse rhonchi.  CHEST - 2 VIEW  Comparison: Chest radiograph performed 09/16/2010  Findings: The lungs remain hypoexpanded.  There is elevation of the right hemidiaphragm, as on the prior study.  Vascular crowding and vascular congestion are seen.  Given increased interstitial markings, mild interstitial edema may be present.  No definite focal airspace consolidation is seen to suggest pneumonia, though evaluation is suboptimal due to lung hypoexpansion.  No pleural effusion or pneumothorax is seen.  The cardiomediastinal silhouette is mildly enlarged.  No acute osseous abnormalities are identified.  IMPRESSION:  1.  Lungs remain hypoexpanded; no definite focal airspace consolidation seen to suggest pneumonia, though evaluation is suboptimal due to lung hypoexpansion. 2.  Vascular congestion and mild cardiomegaly; given increased interstitial markings, mild interstitial edema may be present. 3.  Elevation of the right hemidiaphragm again noted.  Original Report Authenticated By: Tonia Ghent, M.D.    Brief H and P: For complete details please refer to admission H and P, but in brief this a 61 year old female resident of [facility] nursing home. Patient is a poor historian. She was brought in for shortness of breath. She is COPD with chronic respiratory failure obstructive sleep apnea, COPD. She has chronic shortness of breath but states it's been getting  worse. She reports a cough with yellow sputum and wheezing. She reports fevers and, and  edema. We are asked to admit her for further evaluation and management.     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 Is doing much better. Is on a steroid taper, have also added spiriva to her long term COPD regimen to decrease exacerbations requiring hospitalizations. Please note that she has chronic resp failure with a pCO2 that is usually in the range of 70s.  All rest of chronic conditions are stable. Please note that the patient had to stay in the hospital an extra day because the facility was switching out her bariatric bed.  Time spent on Discharge: Greater than 30 minutes.  SignedChaya Jan [facility] Pager: [phone] 09/04/2011, 9:35 AM

## 2011-09-04 NOTE — Progress Notes (Addendum)
Clinical Social Worker spoke with MD regarding return and dc back to SNF: [facility].  MD agreeable, dc paperwork completed and faxed to SNF.  Spoke with house coverage at [facility] to alert of DC.  Patient aware and agreeable to return to SNF.  Will be transported by EMS.  DC packet sent with patient.  No other needs at this time.  Ashley Jacobs, MSW LCSW [phone]  Weekend Coverage [id number]  Spoke with daughter Arelia Longest and alerted of DC plan.  All agreeable. HN

## 2011-09-05 NOTE — Progress Notes (Signed)
UR completed 

## 2011-09-08 LAB — CULTURE, BLOOD (ROUTINE X 2): Culture  Setup Time: 201303010427

## 2011-09-24 IMAGING — CR DG CHEST 2V
2 series · 2 of 2 positions shown · non-contrast
Comparison: Chest radiograph performed 08/10/2010

CLINICAL DATA: Shortness of breath; status post fall out of
wheelchair.

CHEST - 2 VIEW

[w chest lat]
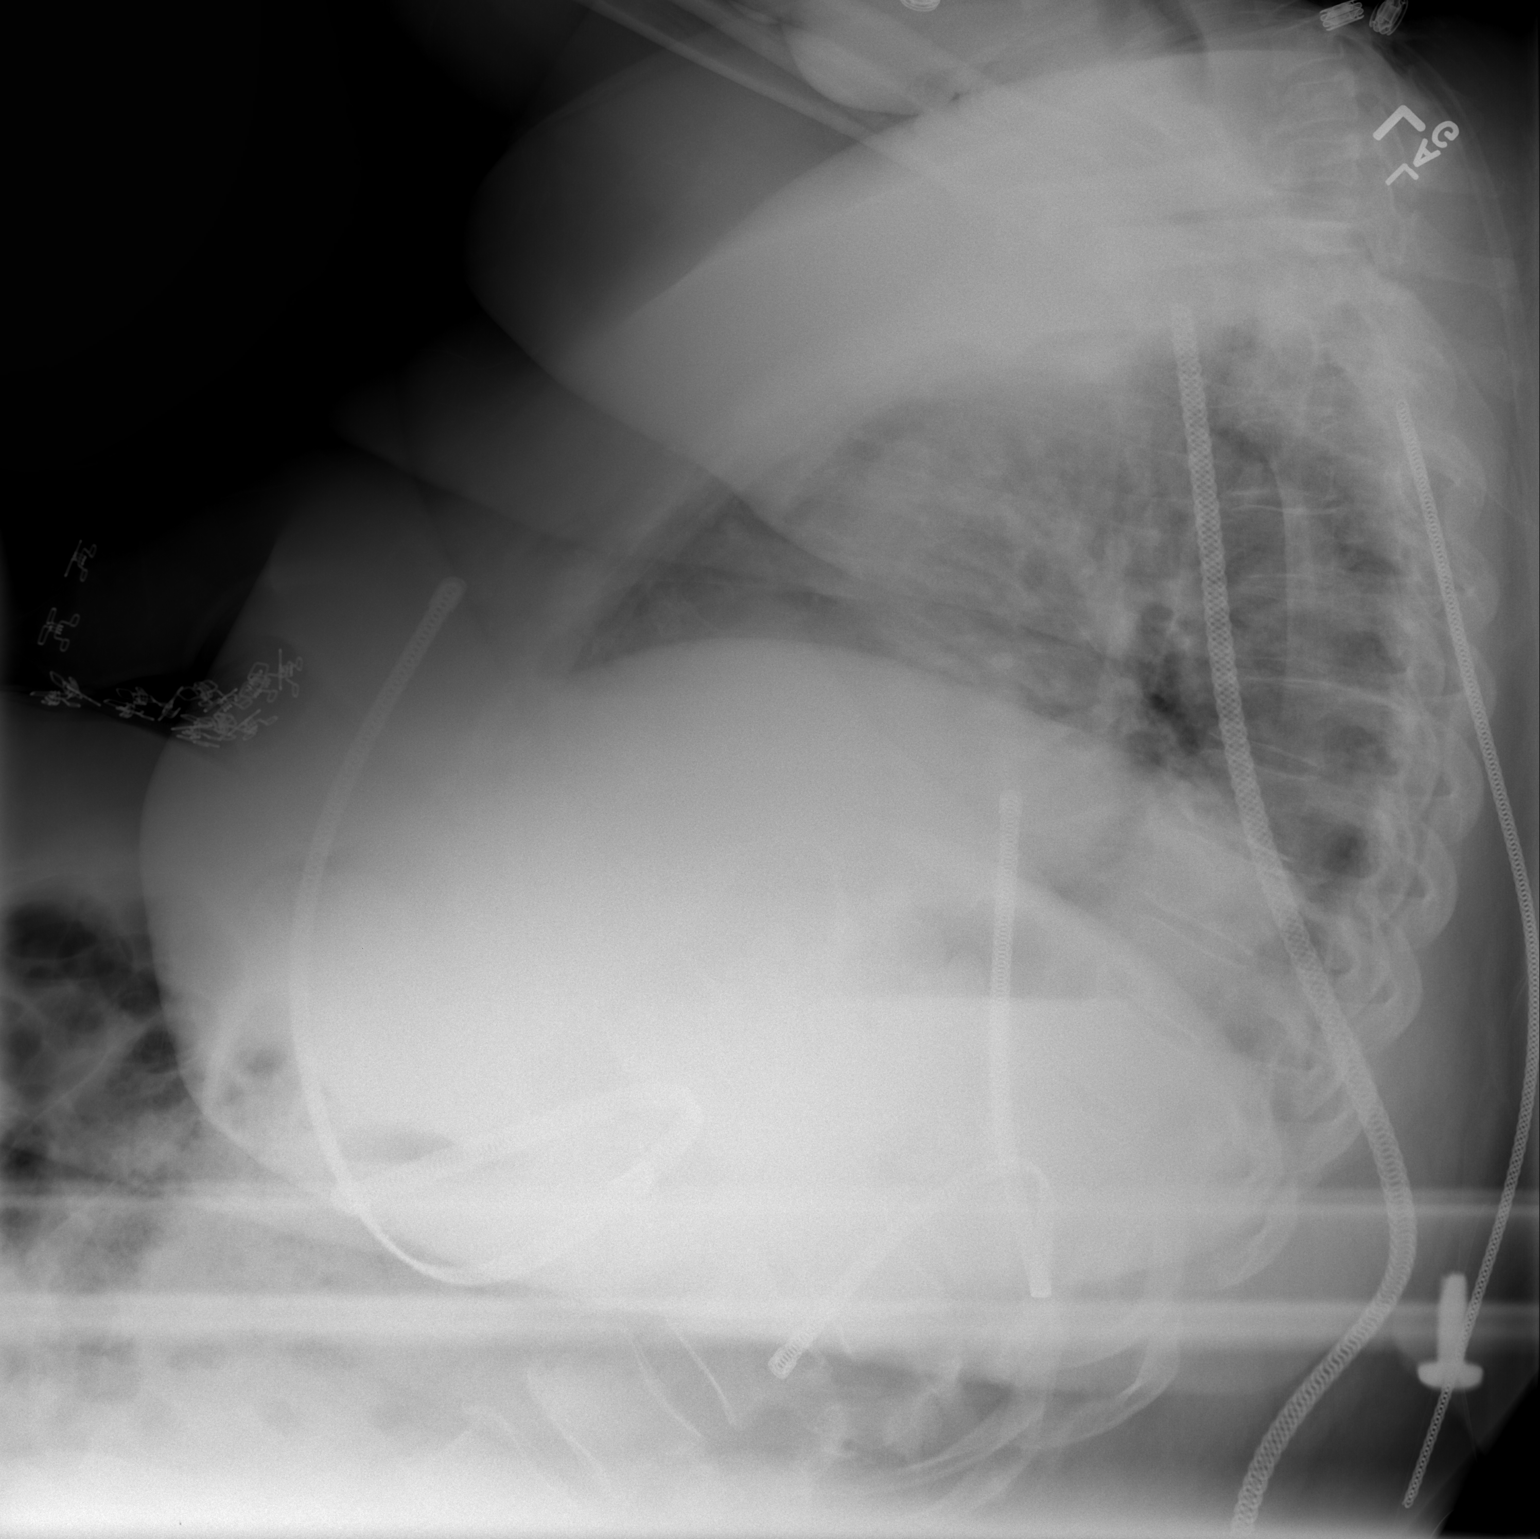

[view not recorded]
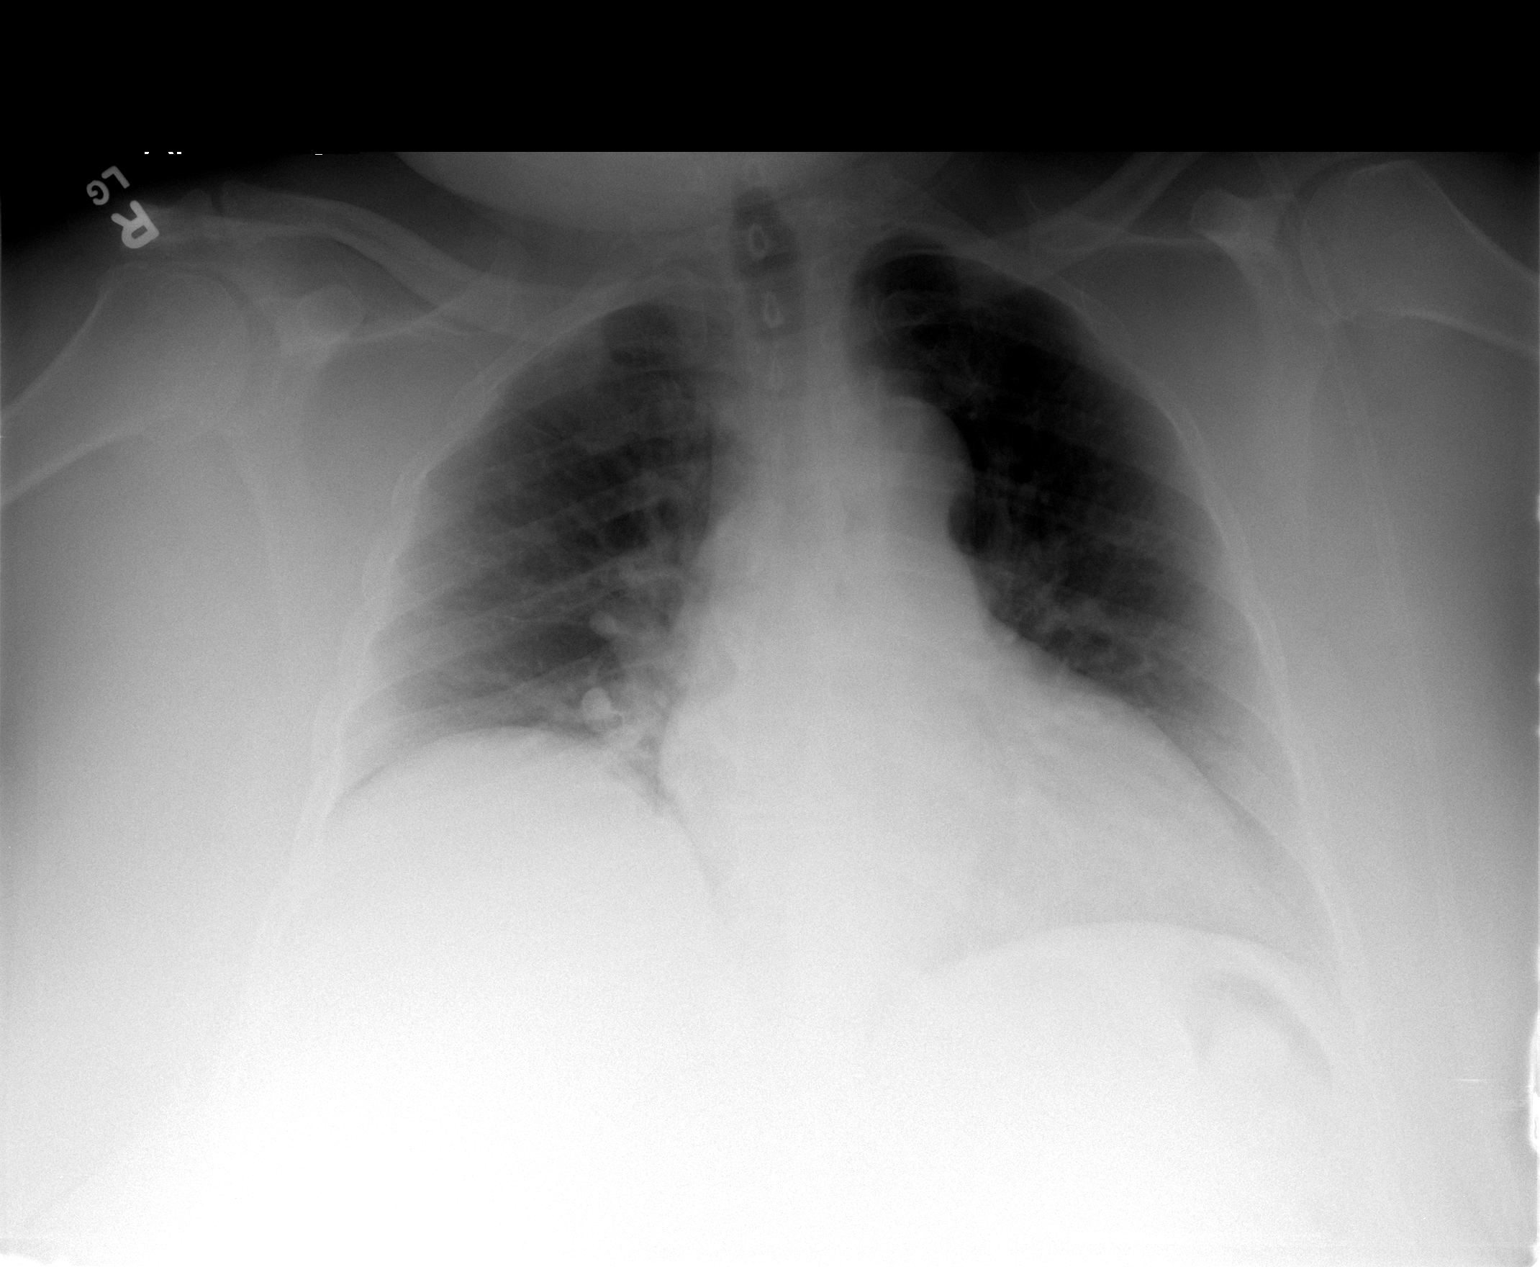

[2 of 2 positions shown; findings below may reference images not displayed]

FINDINGS: Lung expansion is improved from the prior study.  There
is persistent elevation of the right hemidiaphragm, with mild
associated atelectasis.  Vascular congestion is seen, without
evidence of edema.  No pleural effusion or pneumothorax identified.

The could mediastinal silhouette is borderline normal in size.  No
acute osseous abnormalities are seen.
IMPRESSION: 1.  Mild bibasilar atelectasis, with persistent elevation of the
right hemidiaphragm.  Lungs otherwise grossly clear.
2.  Vascular congestion, without evidence of edema.
3.  No displaced rib fractures seen.

## 2011-09-24 IMAGING — CT CT HEAD W/O CM
1 series · 15 of 30 positions shown, 19 images · non-contrast
Comparison: CT of the head performed 04/02/2010

CLINICAL DATA: Status post fall out of bed; hit posterior head.
Headache.

CT HEAD WITHOUT CONTRAST
TECHNIQUE: Contiguous axial images were obtained from the base of
the skull through the vertex without contrast.

[Series 2: headseq 4.8 h45s · axial · 0.43mm/px · z∈[-130,+27]mm · 15 of 36 slices shown, 19 images]
[im 2/36  brain]
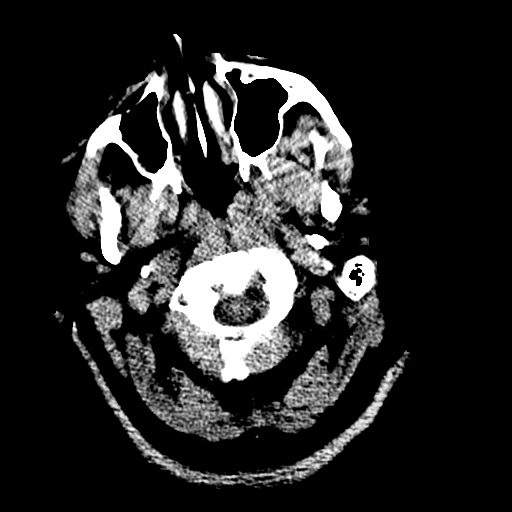
[im 2/36  bone]
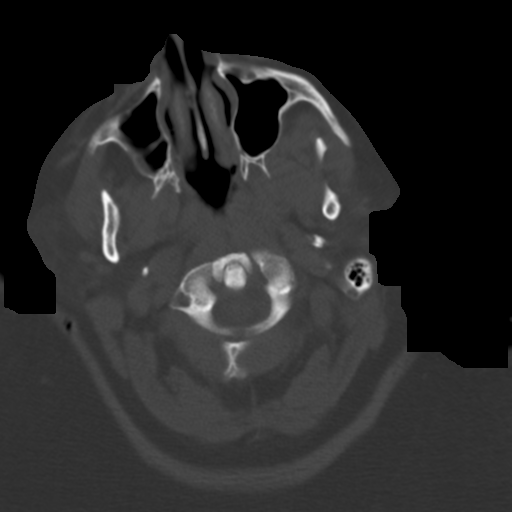
[im 4/36  brain]
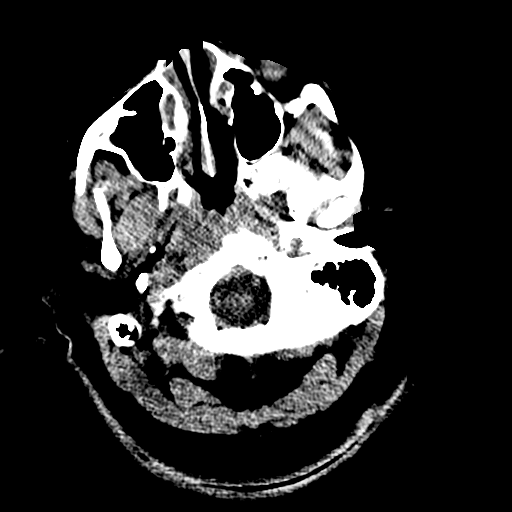
[im 7/36  brain]
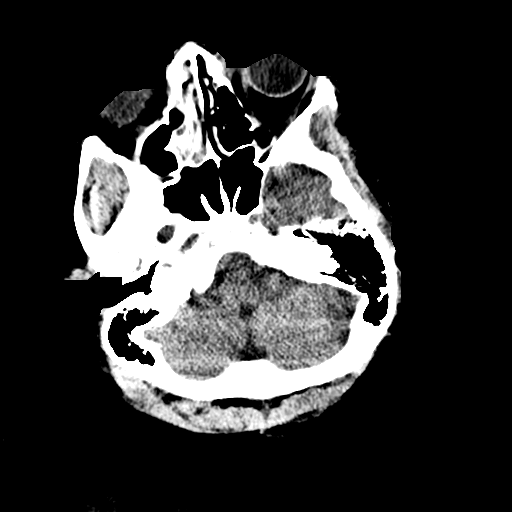
[im 9/36  brain]
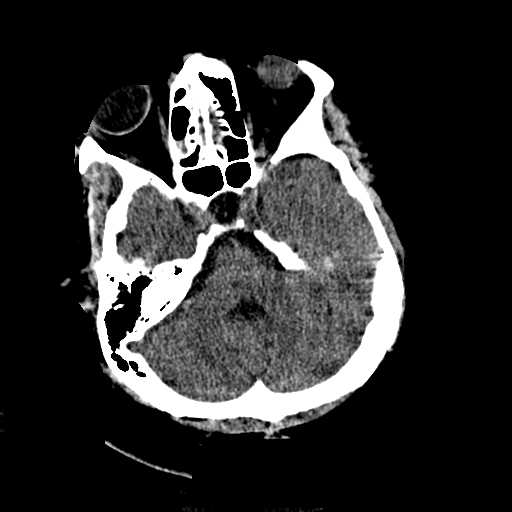
[im 11/36  brain]
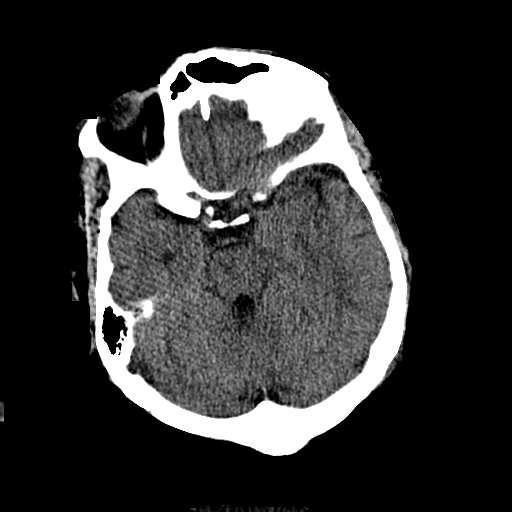
[im 11/36  bone]
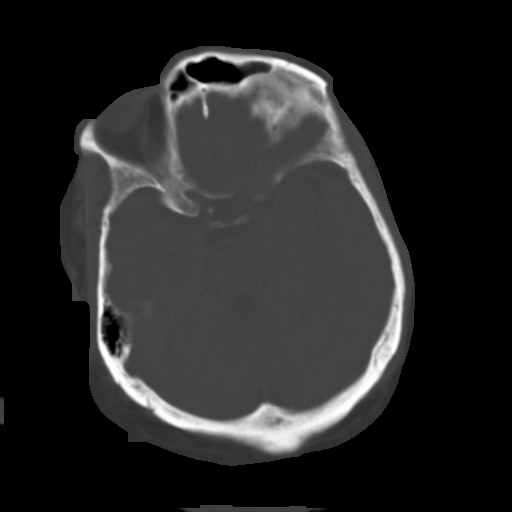
[im 14/36  brain]
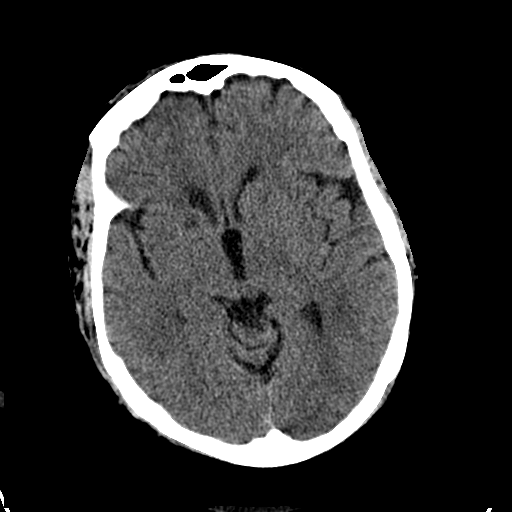
[im 16/36  brain]
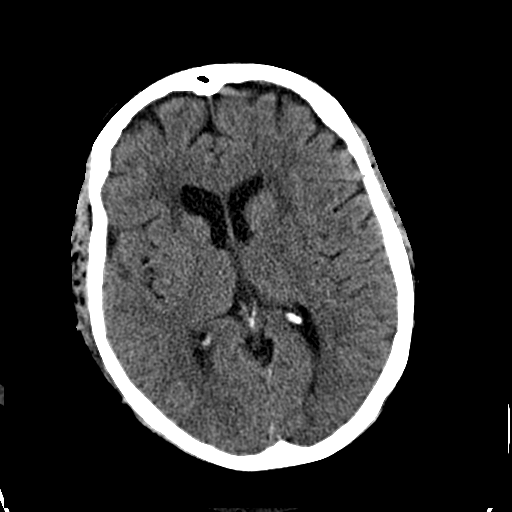
[im 19/36  brain]
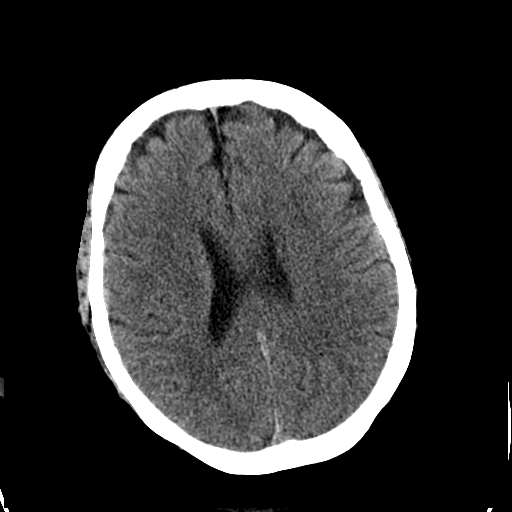
[im 20/36  brain]
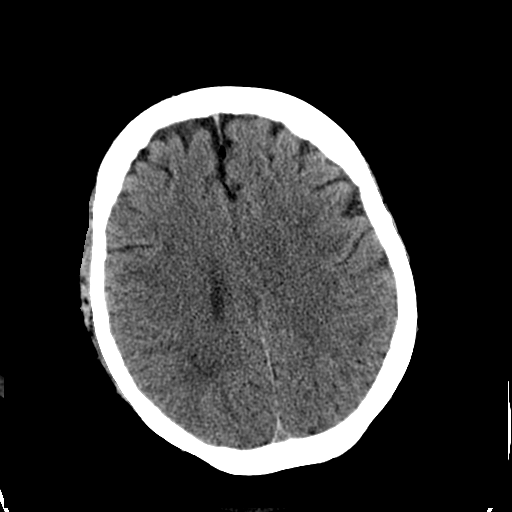
[im 20/36  bone]
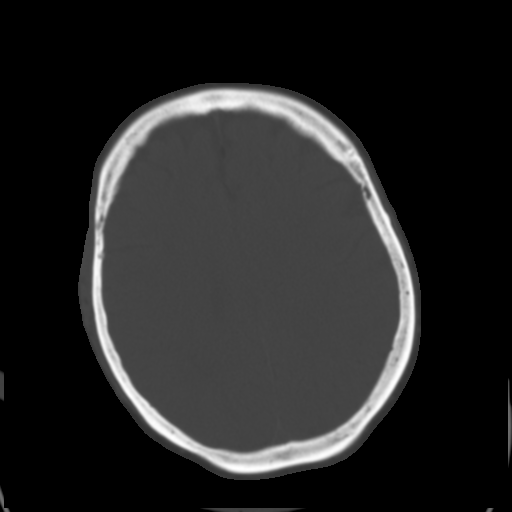
[im 22/36  brain]
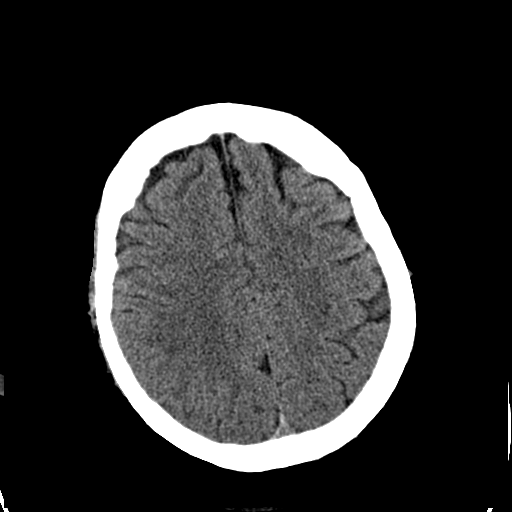
[im 25/36  brain]
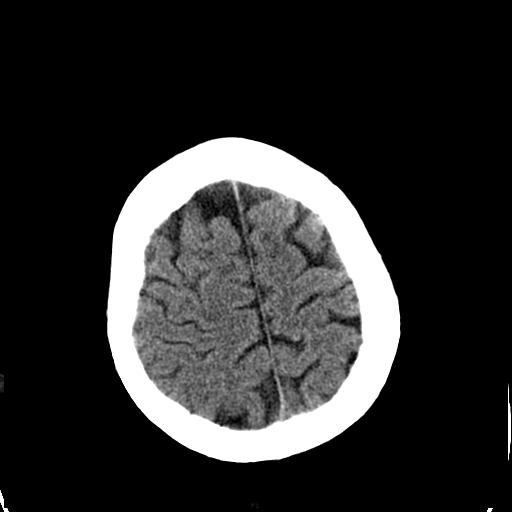
[im 27/36  brain]
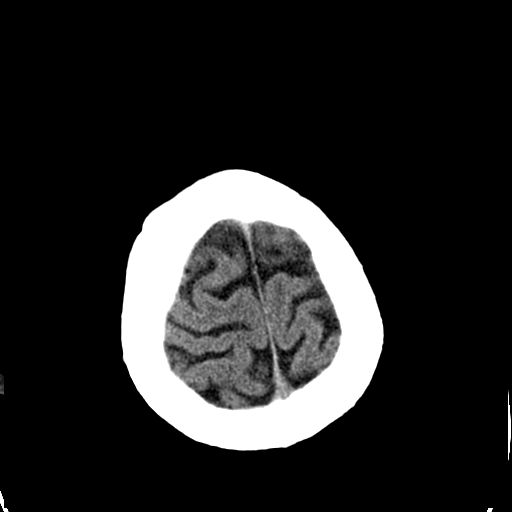
[im 29/36  brain]
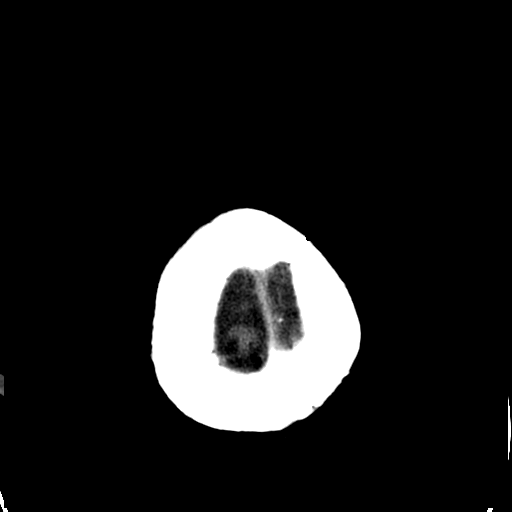
[im 29/36  bone]
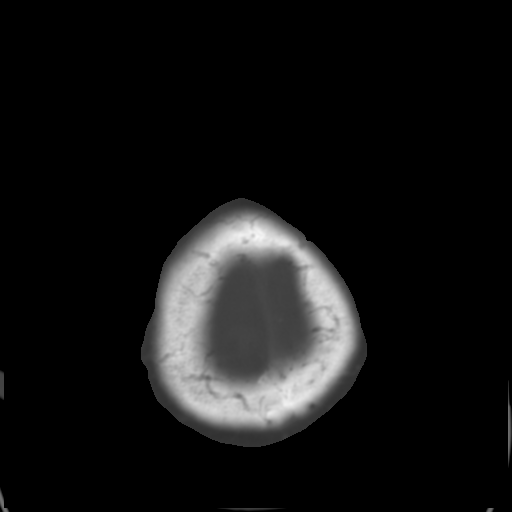
[im 32/36  brain]
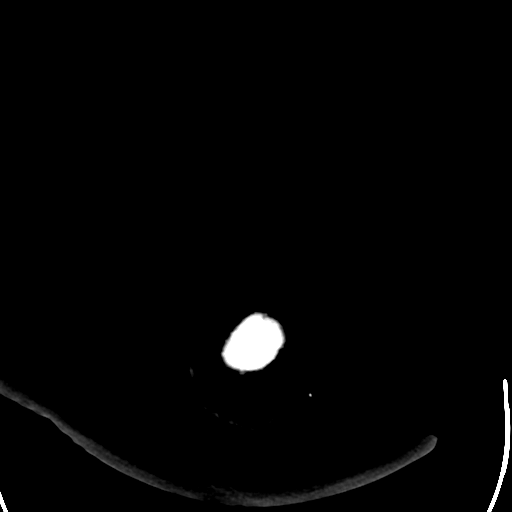
[im 34/36  brain]
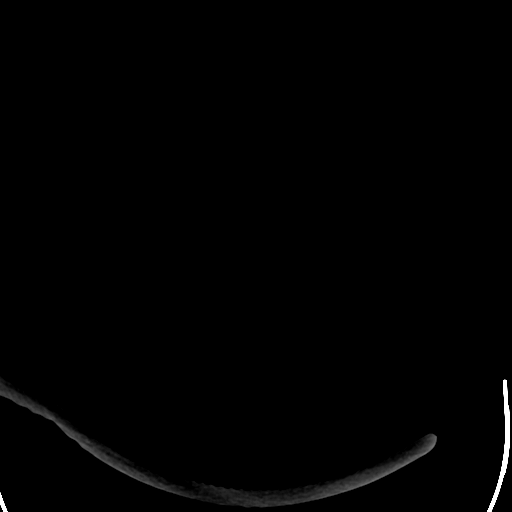

[15 of 30 positions shown; findings below may reference images not displayed]

FINDINGS: There is no evidence of acute infarction, mass lesion, or
intra- or extra-axial hemorrhage on CT.

Scattered small chronic lacunar infarcts are noted in the right
basal ganglia and left corona radiata.

The posterior fossa, including the cerebellum, brainstem and fourth
ventricle, is within normal limits.  The third and lateral
ventricles are unremarkable in appearance.  The cerebral
hemispheres demonstrate normal gray-white differentiation.  No mass
effect or midline shift is seen.

There is no evidence of fracture; visualized osseous structures are
unremarkable in appearance.  The orbits are within normal limits.
Mucosal thickening is noted within the right maxillary sinus and
right frontal sinus; there is partial opacification of the right
ethmoid air cells.  The remaining paranasal sinuses and mastoid air
cells are well-aerated.  No significant soft tissue abnormalities
are seen.
IMPRESSION: 1.  No evidence of traumatic intracranial injury or fracture.
2.  Scattered small chronic lacunar infarcts within the right basal
ganglia and left corona radiata.
3.  Mucosal thickening in the right maxillary sinus and right
frontal sinus, with partial opacification of the ethmoid air cells.

## 2011-10-15 ENCOUNTER — Emergency Department (HOSPITAL_COMMUNITY): Payer: Medicaid Other

## 2011-10-15 ENCOUNTER — Emergency Department (HOSPITAL_COMMUNITY)
Admission: EM | Admit: 2011-10-15 | Discharge: 2011-10-15 | Disposition: A | Discharge status: Skilled Nursing Facility | Payer: Medicaid Other | Attending: Emergency Medicine | Admitting: Emergency Medicine

## 2011-10-15 ENCOUNTER — Encounter (HOSPITAL_COMMUNITY): Payer: Self-pay | Admitting: *Deleted

## 2011-10-15 DIAGNOSIS — W19XXXA Unspecified fall, initial encounter: Secondary | ICD-10-CM | POA: Insufficient documentation

## 2011-10-15 DIAGNOSIS — J4489 Other specified chronic obstructive pulmonary disease: Secondary | ICD-10-CM | POA: Insufficient documentation

## 2011-10-15 DIAGNOSIS — J449 Chronic obstructive pulmonary disease, unspecified: Secondary | ICD-10-CM | POA: Insufficient documentation

## 2011-10-15 DIAGNOSIS — L0390 Cellulitis, unspecified: Secondary | ICD-10-CM | POA: Insufficient documentation

## 2011-10-15 DIAGNOSIS — I10 Essential (primary) hypertension: Secondary | ICD-10-CM | POA: Insufficient documentation

## 2011-10-15 DIAGNOSIS — E119 Type 2 diabetes mellitus without complications: Secondary | ICD-10-CM | POA: Insufficient documentation

## 2011-10-15 DIAGNOSIS — E669 Obesity, unspecified: Secondary | ICD-10-CM | POA: Insufficient documentation

## 2011-10-15 DIAGNOSIS — I219 Acute myocardial infarction, unspecified: Secondary | ICD-10-CM | POA: Insufficient documentation

## 2011-10-15 DIAGNOSIS — Z6841 Body Mass Index (BMI) 40.0 and over, adult: Secondary | ICD-10-CM | POA: Insufficient documentation

## 2011-10-15 DIAGNOSIS — Y9210 Unspecified residential institution as the place of occurrence of the external cause: Secondary | ICD-10-CM | POA: Insufficient documentation

## 2011-10-15 DIAGNOSIS — I252 Old myocardial infarction: Secondary | ICD-10-CM | POA: Insufficient documentation

## 2011-10-15 DIAGNOSIS — M542 Cervicalgia: Secondary | ICD-10-CM | POA: Insufficient documentation

## 2011-10-15 DIAGNOSIS — R51 Headache: Secondary | ICD-10-CM

## 2011-10-15 DIAGNOSIS — R42 Dizziness and giddiness: Secondary | ICD-10-CM | POA: Insufficient documentation

## 2011-10-15 HISTORY — DX: Dizziness and giddiness: R42

## 2011-10-15 HISTORY — DX: Atherosclerotic heart disease of native coronary artery without angina pectoris: I25.10

## 2011-10-15 HISTORY — DX: Cellulitis, unspecified: L03.90

## 2011-10-15 HISTORY — DX: Acute myocardial infarction, unspecified: I21.9

## 2011-10-15 HISTORY — DX: Chronic obstructive pulmonary disease, unspecified: J44.9

## 2011-10-15 NOTE — ED Notes (Signed)
[facility] called for transportation. Report called to at [facility]

## 2011-10-15 NOTE — ED Notes (Signed)
Correction-[facility] did not have C-collar in place

## 2011-10-15 NOTE — ED Notes (Signed)
C/o posterior head & neck, mid back & generalized abd pain. No obvious deformity seen

## 2011-10-15 NOTE — ED Provider Notes (Signed)
History     CSN: [id number]  Arrival date & time 10/15/11  1513   First MD Initiated Contact with Patient 10/15/11 1517      Chief Complaint  Patient presents with  . Fall  . Loss of Consciousness    (Consider location/radiation/quality/duration/timing/severity/associated sxs/prior treatment) HPI Comments: Patient from [facility] - with history of multiple medical problems - presents after a fall from standing with positive loss of consciousness. Patient states she had her baseline dizziness and was trying to transfer from her bed to her wheelchair and fell from standing. Patient states that she fell backwards impacting the hard floor on her back, neck, and posterior head. She states she blacked out after she hit the floor. Patient denies blurry vision, vomiting, chest pain, palpitations, shortness of breath.  Patient is a 61 y.o. female presenting with fall. The history is provided by the patient and the nursing home.  Fall The accident occurred 1 to 2 hours ago. She landed on a hard floor. The point of impact was the head and neck. The pain is present in the head and neck. She was not ambulatory at the scene. Associated symptoms include loss of consciousness. Pertinent negatives include no visual change, no fever, no numbness, no bowel incontinence, no nausea, no vomiting, no headaches and no tingling. Treatment on scene includes a backboard. She has tried nothing for the symptoms.    Past Medical History  Diagnosis Date  . COPD   . Hypertension   . Chronic respiratory failure     3 L oxygen  . GERD (gastroesophageal reflux disease)   . Diabetes mellitus   . Obesity, Class III, BMI 40-49.9 (morbid obesity)   . Obstructive sleep apnea on CPAP   . Peripheral vascular disease   . Bipolar disease, chronic   . Lymphedema   . COPD (chronic obstructive pulmonary disease)   . Cellulitis   . Coronary artery disease   . Asthma   . Myocardial infarction   .  Vertigo     Past Surgical History  Procedure Date  . Abdominal hysterectomy   . Cholecystectomy   . Appendectomy   . Tubal ligation     Family History  Problem Relation Age of Onset  . Prostate cancer      History  Substance Use Topics  . Smoking status: Former Games developer  . Smokeless tobacco: Not on file  . Alcohol Use: No    OB History    Grav Para Term Preterm Abortions TAB SAB Ect Mult Living                  Review of Systems  Constitutional: Negative for fever and fatigue.  HENT: Positive for neck pain. Negative for tinnitus.   Eyes: Negative for photophobia, pain and visual disturbance.  Respiratory: Negative for shortness of breath.   Cardiovascular: Negative for chest pain and palpitations.  Gastrointestinal: Negative for nausea, vomiting and bowel incontinence.  Musculoskeletal: Positive for back pain. Negative for gait problem.  Skin: Negative for wound.  Neurological: Positive for dizziness and loss of consciousness. Negative for tingling, syncope, weakness, light-headedness, numbness and headaches.  Psychiatric/Behavioral: Negative for confusion and decreased concentration.    Allergies  Bee venom; Nyquil cold &; and Penicillins  Home Medications   Current Outpatient Rx  Name Route Sig Dispense Refill  . ALBUTEROL SULFATE (2.5 MG/3ML) 0.083% IN NEBU Nebulization Take 3 mLs (2.5 mg total) by nebulization every 6 (six) hours as needed for  wheezing. 75 mL 12  . ARIPIPRAZOLE 5 MG PO TABS Oral Take 5 mg by mouth daily.    . ASPIRIN 325 MG PO TABS Oral Take 325 mg by mouth daily.    . BUDESONIDE-FORMOTEROL FUMARATE 160-4.5 MCG/ACT IN AERO Inhalation Inhale 2 puffs into the lungs 2 (two) times daily.    . BUSPIRONE HCL 10 MG PO TABS Oral Take 10 mg by mouth 2 (two) times daily.    Marland Kitchen CALCIUM CARBONATE-VITAMIN D 500-200 MG-UNIT PO TABS Oral Take 1 tablet by mouth 2 (two) times daily.    Marland Kitchen CLONAZEPAM 0.5 MG PO TABS Oral Take 0.5 mg by mouth daily.    Marland Kitchen CLONIDINE  HCL 0.2 MG PO TABS Oral Take 0.2 mg by mouth 2 (two) times daily.    Marland Kitchen DIVALPROEX SODIUM ER 500 MG PO TB24 Oral Take 500 mg by mouth 2 (two) times daily.    Marland Kitchen DOCUSATE SODIUM 100 MG PO CAPS Oral Take 100 mg by mouth daily.    . DULOXETINE HCL 60 MG PO CPEP Oral Take 60 mg by mouth 2 (two) times daily.    Marland Kitchen PRO-STAT 64 PO LIQD Oral Take 30 mLs by mouth 2 (two) times daily.    Marland Kitchen FLUTICASONE PROPIONATE 50 MCG/ACT NA SUSP Nasal Place 2 sprays into the nose daily.    . FUROSEMIDE 40 MG PO TABS Oral Take 40 mg by mouth daily.    Marland Kitchen GABAPENTIN 400 MG PO CAPS Oral Take 400 mg by mouth 3 (three) times daily.    . ISOSORBIDE MONONITRATE ER 30 MG PO TB24 Oral Take 30 mg by mouth daily.    Marland Kitchen LISINOPRIL 10 MG PO TABS Oral Take 10 mg by mouth daily.    Marland Kitchen METOCLOPRAMIDE HCL 5 MG PO TABS Oral Take 5 mg by mouth 3 (three) times daily.    . MORPHINE SULFATE ER PO Oral Take 15 mg by mouth 2 (two) times daily.    . CERTAGEN PO Oral Take 1 tablet by mouth daily.    Marland Kitchen NITROGLYCERIN 0.4 MG SL SUBL Sublingual Place 0.4 mg under the tongue every 5 (five) minutes as needed.    . OLOPATADINE HCL 0.2 % OP SOLN Ophthalmic Apply 1 drop to eye daily. In each eye    . OXYCODONE-ACETAMINOPHEN 5-325 MG PO TABS Oral Take 1 tablet by mouth every 4 (four) hours as needed. For pain    . PANTOPRAZOLE SODIUM 40 MG PO TBEC Oral Take 40 mg by mouth 2 (two) times daily.    Marland Kitchen POLYETHYL GLYCOL-PROPYL GLYCOL 0.4-0.3 % OP SOLN Ophthalmic Apply 1 drop to eye daily. In both eyes    . SENNA 8.6 MG PO TABS Oral Take 2 tablets by mouth daily.    Marland Kitchen SIMETHICONE 80 MG PO CHEW Oral Chew 80 mg by mouth 3 (three) times daily.    Marland Kitchen TIOTROPIUM BROMIDE MONOHYDRATE 18 MCG IN CAPS Inhalation Place 1 capsule (18 mcg total) into inhaler and inhale daily. 30 capsule 12  . VITAMIN C 500 MG PO TABS Oral Take 500 mg by mouth 2 (two) times daily.    Marland Kitchen VITAMIN D 400 UNITS PO TABS Oral Take 400 Units by mouth daily.    Marland Kitchen ZOLPIDEM TARTRATE 5 MG PO TABS Oral Take 5  mg by mouth at bedtime as needed. For sleep      BP 159/97  Pulse 77  Temp(Src) 98.6 F (37 C) (Oral)  Resp 18  SpO2 98%  Physical Exam  Nursing  note and vitals reviewed. Constitutional: She is oriented to person, place, and time. She appears well-developed and well-nourished.  HENT:  Head: Normocephalic and atraumatic. Head is without raccoon's eyes and without Battle's sign.  Right Ear: Tympanic membrane, external ear and ear canal normal. No hemotympanum.  Left Ear: Tympanic membrane, external ear and ear canal normal. No hemotympanum.  Nose: Nose normal. No nasal septal hematoma.  Mouth/Throat: Uvula is midline, oropharynx is clear and moist and mucous membranes are normal.       Edentulous  Eyes: Conjunctivae, EOM and lids are normal. Pupils are equal, round, and reactive to light. Right eye exhibits no discharge. Left eye exhibits no discharge. Right eye exhibits no nystagmus. Left eye exhibits no nystagmus.       No visible hyphema noted  Neck:       Immobilized neck  Cardiovascular: Normal rate, regular rhythm and normal heart sounds.   Pulmonary/Chest: Effort normal and breath sounds normal. No respiratory distress. She has no wheezes.       No bruising or skin trauma  Abdominal: Soft. Bowel sounds are normal. She exhibits no distension. There is no rebound and no guarding.       No bruising or skin trauma  Musculoskeletal: Normal range of motion.       Cervical back: She exhibits normal range of motion, no tenderness and no bony tenderness.       Thoracic back: She exhibits tenderness. She exhibits no bony tenderness.       Lumbar back: She exhibits no tenderness and no bony tenderness.  Neurological: She is alert and oriented to person, place, and time. She has normal strength and normal reflexes. No cranial nerve deficit or sensory deficit. Coordination normal. GCS eye subscore is 4. GCS verbal subscore is 5. GCS motor subscore is 6.  Skin: Skin is warm and dry.    Psychiatric: She has a normal mood and affect.    ED Course  Procedures (including critical care time)  Labs Reviewed - No data to display Ct Head Wo Contrast  10/15/2011  *RADIOLOGY REPORT*  Clinical Data:  Fall.  Loss of consciousness.  Posterior headache. Diffuse neck pain.  CT HEAD WITHOUT CONTRAST CT CERVICAL SPINE WITHOUT CONTRAST  Technique:  Multidetector CT imaging of the head and cervical spine was performed following the standard protocol without intravenous contrast.  Multiplanar CT image reconstructions of the cervical spine were also generated.  Comparison:  CT head without contrast 09/16/2010.  CT head and cervical spine 04/02/2010.  CT HEAD  Findings: Atrophy and moderate white matter disease is similar to the prior exam.  No lacunar infarcts of the basal ganglia bilaterally are remote.  The ventricles are stable.  No significant extra-axial fluid collection is present.  The paranasal sinuses and mastoid air cells are clear.  The osseous skull is intact.  The no significant extracranial soft tissue injuries are evident.  IMPRESSION:  1.  No acute intracranial abnormality or significant interval change. 2.  Stable atrophy and white matter disease. 3.  Stable remote lacunar infarcts of the basal ganglia, more prominently on the left.  CT CERVICAL SPINE  Findings: The cervical spine is imaged from skull base through T3. The bone detail is degraded by patient body habitus.  The anterior osteophytes are present at C4-5, C5-6, and C6-7.  There is some straightening of the normal cervical lordosis. The soft tissues of the neck are unremarkable.  Scattered ground-glass attenuation in the upper lobes bilaterally is most  compatible with edema or atelectasis.  IMPRESSION:  1.  Moderate spondylosis at C5-6 and C6-7. 2.  No acute fracture or traumatic subluxation. 3.  Ground-glass attenuation at the lung apices bilaterally.  This may represent atelectasis or edema.  Infection is also considered.   Original Report Authenticated By: Jamesetta Orleans. MATTERN, M.D.   Ct Cervical Spine Wo Contrast  10/15/2011  *RADIOLOGY REPORT*  Clinical Data:  Fall.  Loss of consciousness.  Posterior headache. Diffuse neck pain.  CT HEAD WITHOUT CONTRAST CT CERVICAL SPINE WITHOUT CONTRAST  Technique:  Multidetector CT imaging of the head and cervical spine was performed following the standard protocol without intravenous contrast.  Multiplanar CT image reconstructions of the cervical spine were also generated.  Comparison:  CT head without contrast 09/16/2010.  CT head and cervical spine 04/02/2010.  CT HEAD  Findings: Atrophy and moderate white matter disease is similar to the prior exam.  No lacunar infarcts of the basal ganglia bilaterally are remote.  The ventricles are stable.  No significant extra-axial fluid collection is present.  The paranasal sinuses and mastoid air cells are clear.  The osseous skull is intact.  The no significant extracranial soft tissue injuries are evident.  IMPRESSION:  1.  No acute intracranial abnormality or significant interval change. 2.  Stable atrophy and white matter disease. 3.  Stable remote lacunar infarcts of the basal ganglia, more prominently on the left.  CT CERVICAL SPINE  Findings: The cervical spine is imaged from skull base through T3. The bone detail is degraded by patient body habitus.  The anterior osteophytes are present at C4-5, C5-6, and C6-7.  There is some straightening of the normal cervical lordosis. The soft tissues of the neck are unremarkable.  Scattered ground-glass attenuation in the upper lobes bilaterally is most compatible with edema or atelectasis.  IMPRESSION:  1.  Moderate spondylosis at C5-6 and C6-7. 2.  No acute fracture or traumatic subluxation. 3.  Ground-glass attenuation at the lung apices bilaterally.  This may represent atelectasis or edema.  Infection is also considered.  Original Report Authenticated By: Jamesetta Orleans. MATTERN, M.D.     1.  Fall   2. Headache     3:36 PM Patient seen and examined. Pt removed from backboard with help of nurse, tech, nursing student. C-collar applied. Work-up initiated. D/w Dr. Effie Shy.    Vital signs reviewed and are as follows: Filed Vitals:   10/15/11 1522  BP: 159/97  Pulse: 77  Temp: 98.6 F (37 C)  Resp: 18    Date: 10/15/2011  Rate: 75  Rhythm: normal sinus rhythm  QRS Axis: normal  Intervals: normal  ST/T Wave abnormalities: normal  Conduction Disutrbances:none  Narrative Interpretation:   Old EKG Reviewed: unchanged from 09/01/11  4:04 PM Patient seen. She continues to be comfortable. States pain is controlled. Awaiting CT of head/neck.   6:21 PM imaging studies reviewed by myself. C-collar removed. The patient moves her neck well in all 6 directions. She continues to states that she feels well. Patient seen by Dr. Effie Shy. Will discharge back to facility.  Instructions given regarding head injury precautions and fall prevention.  MDM  Patient with fall, positive loss of consciousness after hitting ground. Patient was dizzy prior but states this is not unusual for her. Do not suspect cardiac cause of syncope. Imaging studies are negative. Do not suspect significant back, thoracic, or abdominal injury. Patient appears well at time of discharge.        Renne Crigler, [place]  10/15/11 1822 

## 2011-10-15 NOTE — ED Notes (Signed)
From [facility] - pt fell while trying to get into a wheelchair, struck her head & states +LOC (unwitnessed).  C/o head, neck & back pain

## 2011-10-15 NOTE — ED Provider Notes (Signed)
   Patient is alert and calm, now. She states that her facility. Does not give her pain medicine because she usually takes too much. Cranial nerves are symmetric. Upper extremity strength is symmetric. No signs of serious intracranial injury. Imaging study results are reviewed.  Medical screening examination/treatment/procedure(s) were conducted as a shared visit with non-physician practitioner(s) and myself.  I personally evaluated the patient during the encounter  Flint Melter, MD 10/15/11 1818

## 2011-10-15 NOTE — Discharge Instructions (Signed)
Please read and follow all provided instructions.  Your diagnoses today include:  1. Fall   2. Headache     Tests performed today include:  CT head and neck -- no broken bones or serious injuries noted  EKG - no concerning findings  Vital signs. See below for your results today.   Medications prescribed:   None  Home care instructions:  Follow any educational materials contained in this packet.  Follow-up instructions: Please follow-up with your primary care provider in the next 3 days for further evaluation of your symptoms. If you do not have a primary care doctor -- see below for referral information.   Return instructions:   Please return to the Emergency Department if you experience worsening symptoms.   Please return if you have any other emergent concerns.  Additional Information:  Your vital signs today were: BP 137/77  Pulse 73  Temp(Src) 98.6 F (37 C) (Oral)  Resp 20  SpO2 100% If your blood pressure (BP) was elevated above 135/85 this visit, please have this repeated by your doctor within one month. -------------- No Primary Care Doctor Call [facility]  [phone] Other agencies that provide inexpensive medical care    [facility]  [phone]    [facility]  [phone]    [facility]  [phone]    [facility]  [phone]    [facility]  [phone]    [facility]  [phone] -------------- RESOURCE GUIDE:  Dental Problems  Patients with Medicaid: [facility] [id number]                                            [phone] [address] Phone:  [phone]                                                   Phone:  [phone]  If unable to pay or uninsured, contact:  [facility] or [facility]. to become qualified for the adult dental clinic.  Chronic Pain Problems Contact [facility]  [phone] Patients  need to be referred by their primary care doctor.  Insufficient Money for Medicine Contact [employer]:  call "211" or [facility] [phone].  Psychological Services [facility]  [phone] [facility]  [phone] [facility]   [phone] (emergency services [phone])  Substance Abuse [facility]  [phone] [facility] [phone] [facility] [phone] Floydene Flock [phone] Residential & Outpatient Substance Abuse Program  [phone]  Abuse/Neglect [facility] Child Abuse Hotline [phone] [facility] Child Abuse Hotline (641)052-5365 (After Hours)  Emergency Shelter [facility] [phone]  [facility] [place] [facility] [place] [phone] [place] [facility] [phone]  [facility] Resources  [facility]     [employer]                          [facility] [address]  [place] Phone:  [phone]                                   Phone:  [phone]                 Phone:  [place] Phone:  [place] [phone] (After Hours)

## 2011-10-15 NOTE — ED Notes (Signed)
Patient transported to CT 

## 2011-10-16 NOTE — ED Provider Notes (Signed)
Medical screening examination/treatment/procedure(s) were performed by non-physician practitioner and as supervising physician I was immediately available for consultation/collaboration.  Flint Melter, MD 10/16/11 [phone]

## 2012-02-17 ENCOUNTER — Other Ambulatory Visit: Payer: Self-pay | Admitting: Internal Medicine

## 2012-02-17 DIAGNOSIS — Z1231 Encounter for screening mammogram for malignant neoplasm of breast: Secondary | ICD-10-CM

## 2012-02-24 ENCOUNTER — Ambulatory Visit
Admission: RE | Admit: 2012-02-24 | Discharge: 2012-02-24 | Disposition: A | Discharge status: Home / Self Care | Payer: No Typology Code available for payment source | Source: Ambulatory Visit | Attending: Internal Medicine | Admitting: Internal Medicine

## 2012-02-24 ENCOUNTER — Ambulatory Visit: Payer: Medicaid Other

## 2012-02-24 DIAGNOSIS — Z1231 Encounter for screening mammogram for malignant neoplasm of breast: Secondary | ICD-10-CM

## 2012-03-21 IMAGING — CR DG TIBIA/FIBULA 2V*L*
3 series · 3 of 3 positions shown · non-contrast
Comparison: 09/05/2003

CLINICAL DATA: Left tibia edema and ulcers.

LEFT TIBIA AND FIBULA - 2 VIEW

[x tib-fib ap left]
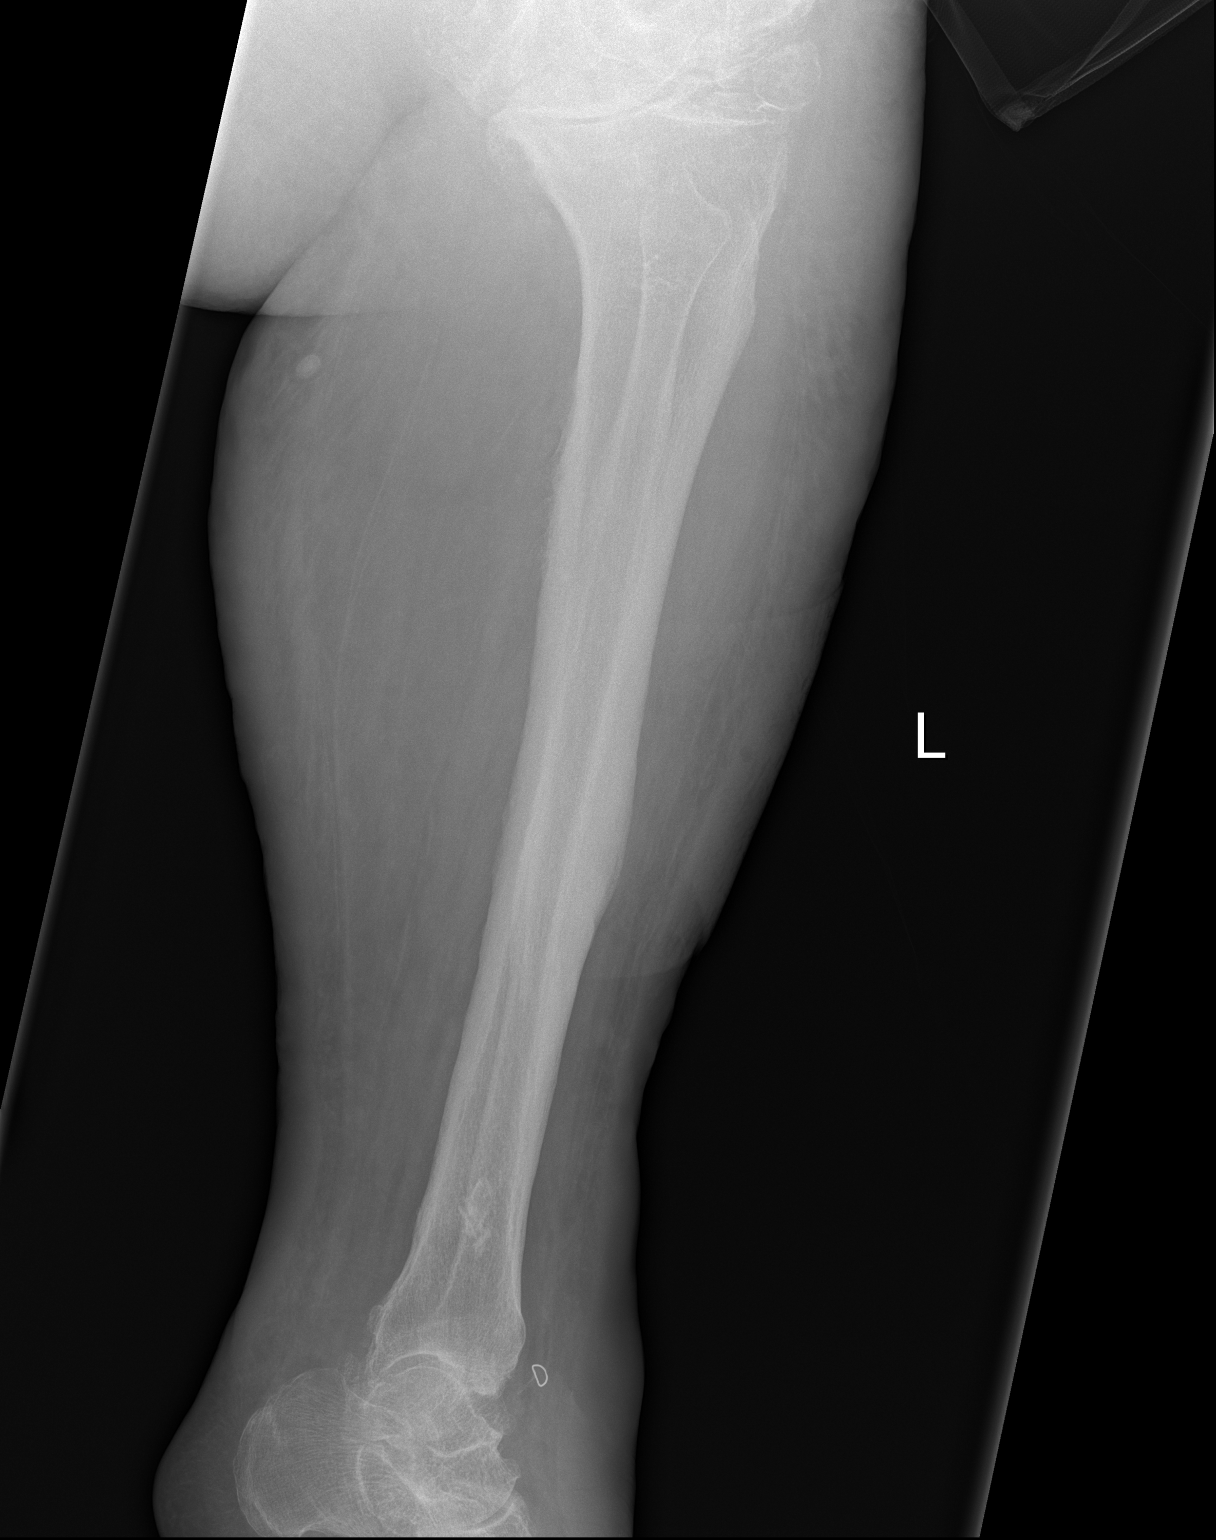

[x tib-fib lat left (1 of 2)]
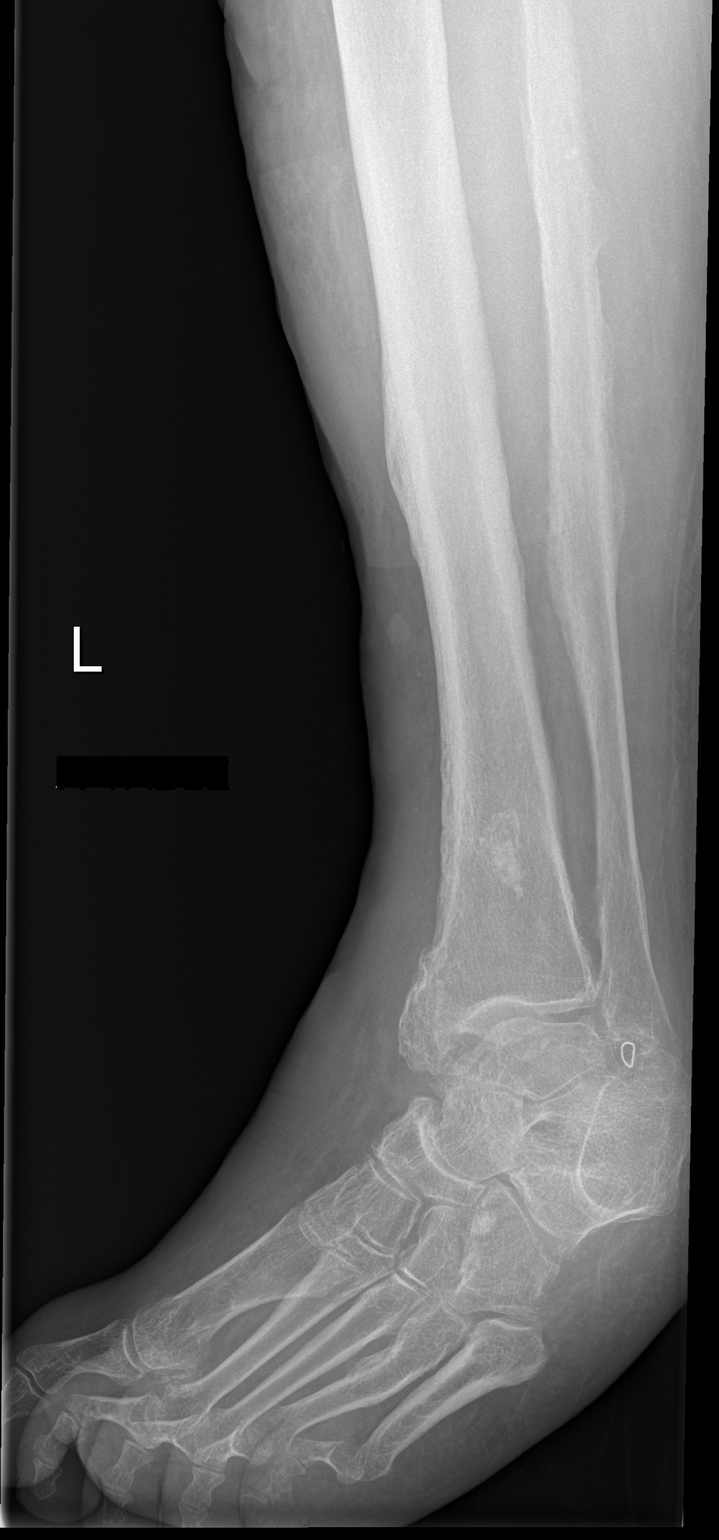

[x tib-fib lat left (2 of 2)]
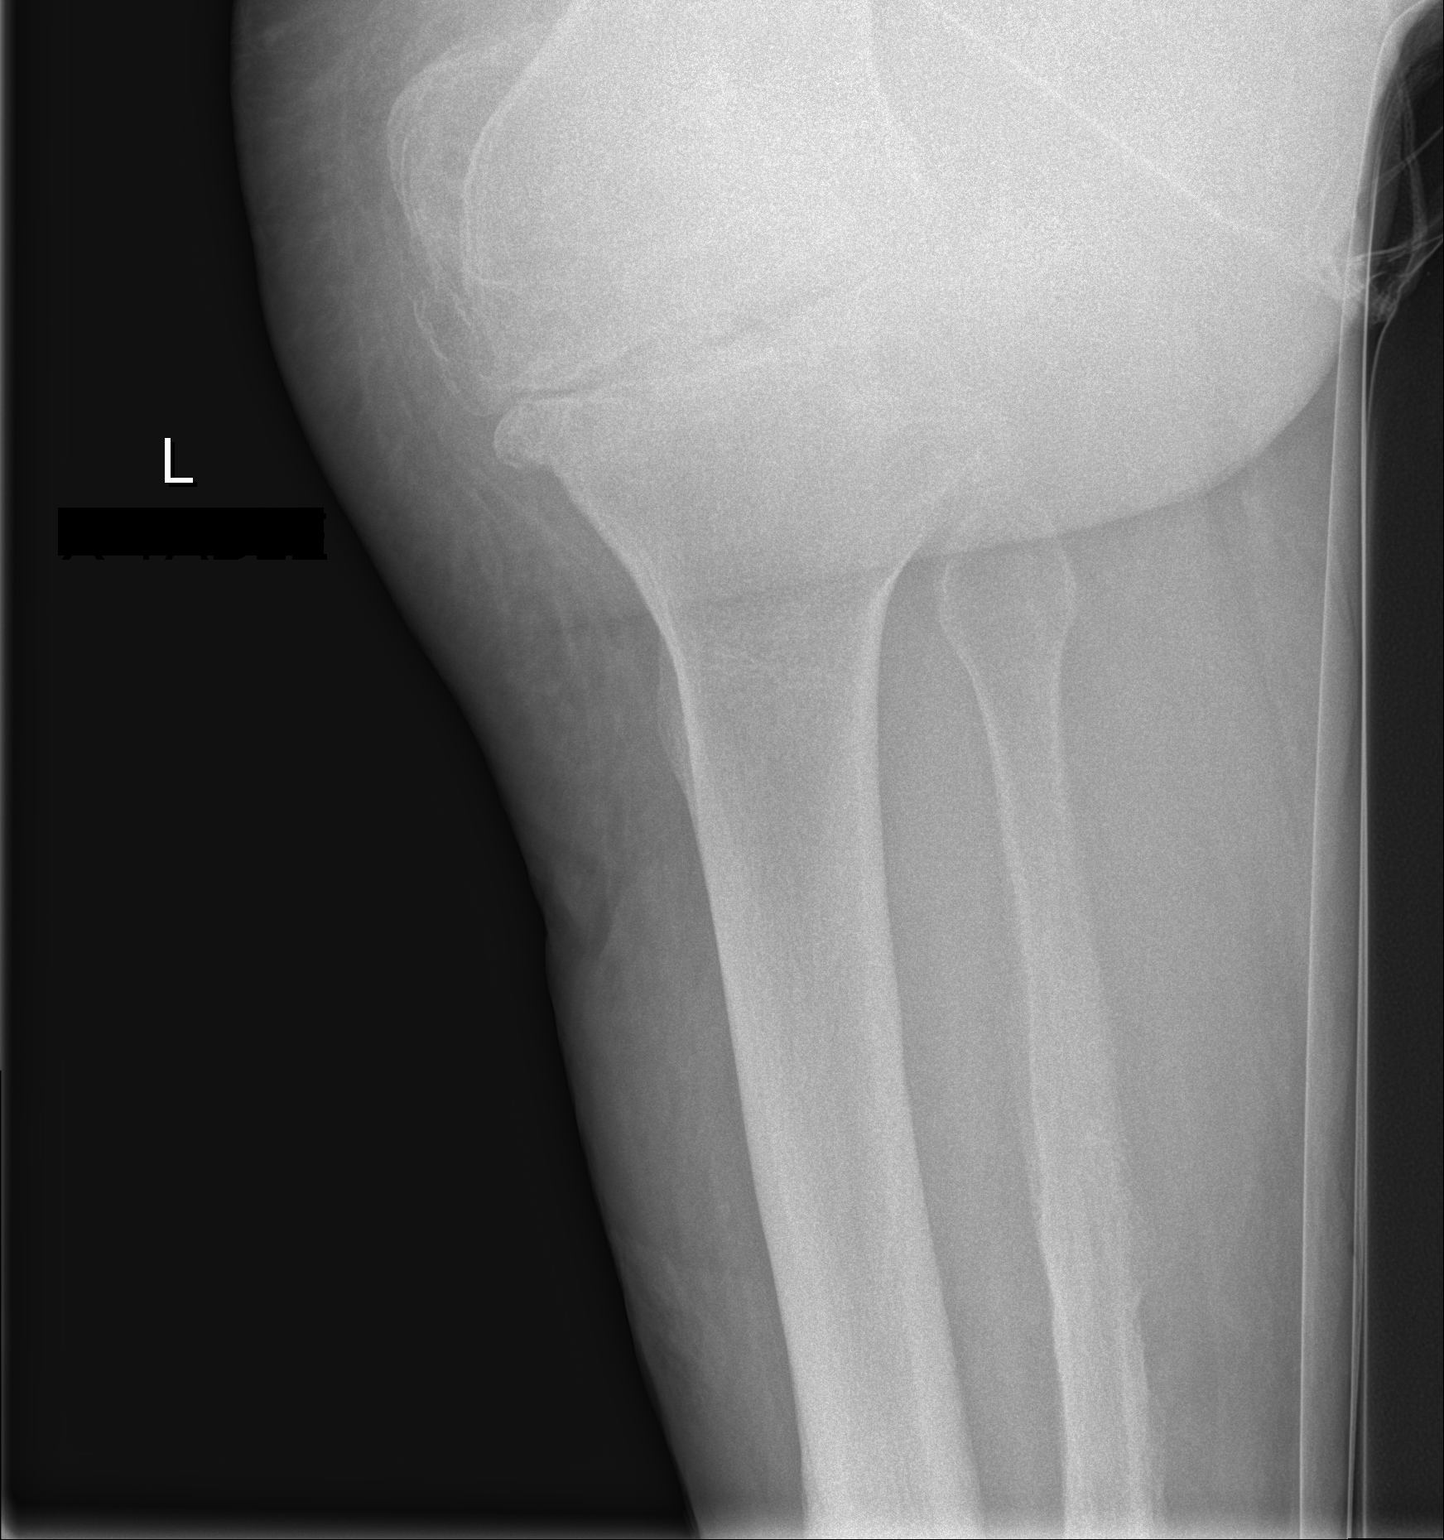

[3 of 3 positions shown; findings below may reference images not displayed]

FINDINGS: Diffuse osteopenia.  Periosteal thickening, similar to
comparison.  There is a sclerotic focus within the distal left
tibia, similar to prior, may represent an enchondroma or prior
infarct. Left knee DJD.  No displaced fracture identified.
IMPRESSION: Chronic osseous changes and diffuse osteopenia.  No acute fracture
identified.  If clinical concern for osteomyelitis persists, follow-
up MRI recommended.

## 2012-06-28 ENCOUNTER — Emergency Department (HOSPITAL_COMMUNITY)
Admission: EM | Admit: 2012-06-28 | Discharge: 2012-06-29 | Disposition: A | Discharge status: Home / Self Care | Payer: Medicaid Other | Attending: Emergency Medicine | Admitting: Emergency Medicine

## 2012-06-28 DIAGNOSIS — I872 Venous insufficiency (chronic) (peripheral): Secondary | ICD-10-CM | POA: Insufficient documentation

## 2012-06-28 DIAGNOSIS — I10 Essential (primary) hypertension: Secondary | ICD-10-CM | POA: Insufficient documentation

## 2012-06-28 DIAGNOSIS — I252 Old myocardial infarction: Secondary | ICD-10-CM | POA: Insufficient documentation

## 2012-06-28 DIAGNOSIS — Z87891 Personal history of nicotine dependence: Secondary | ICD-10-CM | POA: Insufficient documentation

## 2012-06-28 DIAGNOSIS — E119 Type 2 diabetes mellitus without complications: Secondary | ICD-10-CM | POA: Insufficient documentation

## 2012-06-28 DIAGNOSIS — L97909 Non-pressure chronic ulcer of unspecified part of unspecified lower leg with unspecified severity: Secondary | ICD-10-CM | POA: Insufficient documentation

## 2012-06-28 DIAGNOSIS — Z7982 Long term (current) use of aspirin: Secondary | ICD-10-CM | POA: Insufficient documentation

## 2012-06-28 DIAGNOSIS — J4489 Other specified chronic obstructive pulmonary disease: Secondary | ICD-10-CM | POA: Insufficient documentation

## 2012-06-28 DIAGNOSIS — I2510 Atherosclerotic heart disease of native coronary artery without angina pectoris: Secondary | ICD-10-CM | POA: Insufficient documentation

## 2012-06-28 DIAGNOSIS — J9610 Chronic respiratory failure, unspecified whether with hypoxia or hypercapnia: Secondary | ICD-10-CM | POA: Insufficient documentation

## 2012-06-28 DIAGNOSIS — J449 Chronic obstructive pulmonary disease, unspecified: Secondary | ICD-10-CM | POA: Insufficient documentation

## 2012-06-28 DIAGNOSIS — J45909 Unspecified asthma, uncomplicated: Secondary | ICD-10-CM | POA: Insufficient documentation

## 2012-06-28 DIAGNOSIS — G4733 Obstructive sleep apnea (adult) (pediatric): Secondary | ICD-10-CM | POA: Insufficient documentation

## 2012-06-28 DIAGNOSIS — I739 Peripheral vascular disease, unspecified: Secondary | ICD-10-CM | POA: Insufficient documentation

## 2012-06-28 DIAGNOSIS — F319 Bipolar disorder, unspecified: Secondary | ICD-10-CM | POA: Insufficient documentation

## 2012-06-28 DIAGNOSIS — Z79899 Other long term (current) drug therapy: Secondary | ICD-10-CM | POA: Insufficient documentation

## 2012-06-28 DIAGNOSIS — K219 Gastro-esophageal reflux disease without esophagitis: Secondary | ICD-10-CM | POA: Insufficient documentation

## 2012-06-28 DIAGNOSIS — I83009 Varicose veins of unspecified lower extremity with ulcer of unspecified site: Secondary | ICD-10-CM

## 2012-06-28 DIAGNOSIS — I890 Lymphedema, not elsewhere classified: Secondary | ICD-10-CM

## 2012-06-28 LAB — CBC WITH DIFFERENTIAL/PLATELET
Basophils Relative: 0 % (ref 0–1)
Eosinophils Absolute: 0.1 10*3/uL (ref 0.0–0.7)
Eosinophils Relative: 1 % (ref 0–5)
Lymphs Abs: 1.6 10*3/uL (ref 0.7–4.0)
MCH: 26.1 pg (ref 26.0–34.0)
MCHC: 30.8 g/dL (ref 30.0–36.0)
MCV: 84.8 fL (ref 78.0–100.0)
Neutrophils Relative %: 74 % (ref 43–77)
Platelets: 238 10*3/uL (ref 150–400)
RBC: 3.48 MIL/uL — ABNORMAL LOW (ref 3.87–5.11)
RDW: 14.3 % (ref 11.5–15.5)

## 2012-06-28 MED ORDER — HYDROMORPHONE HCL PF 1 MG/ML IJ SOLN
1.0000 mg | Freq: Once | INTRAMUSCULAR | Status: AC
  Administered 2012-06-28: 1 mg via INTRAVENOUS
  Filled 2012-06-28: qty 1

## 2012-06-28 MED ORDER — ONDANSETRON HCL 4 MG/2ML IJ SOLN
4.0000 mg | Freq: Once | INTRAMUSCULAR | Status: AC
  Administered 2012-06-28: 4 mg via INTRAVENOUS
  Filled 2012-06-28: qty 2

## 2012-06-28 NOTE — ED Notes (Signed)
Pt from [facility] with complaints of pain to the arms and legs. Pt has cellulitis to both legs per EMS. Pt states legs are bleeding. Pt taking morphine and oxycodone at the facility and states that it is not helping. Left leg is currently wrapped in an ace wrap.

## 2012-06-28 NOTE — ED Notes (Signed)
Bed:WHALA<BR> Expected date:<BR> Expected time:<BR> Means of arrival:<BR> Comments:<BR> EMS/from Nursing facility with c/o&#39;s lower extremity pain-cellulitis

## 2012-06-28 NOTE — ED Notes (Signed)
Pt states she is having pain in  her head,stomach and  legs that started today. Pt has a una boot on the left leg and the right leg has a tegraderm covering a wound with whitish drainage. Pedal pulses +2 in both feet.

## 2012-06-28 NOTE — ED Provider Notes (Addendum)
History     CSN: [id number]  Arrival date & time 06/28/12  2139   First MD Initiated Contact with Patient 06/28/12 2205      Chief Complaint  Patient presents with  . Leg Pain  . Arm Pain    (Consider location/radiation/quality/duration/timing/severity/associated sxs/prior treatment) HPI Comments: Patient presents from her nursing facility complaining of upper and lower extremity pain that has been ongoing for the last 3 months and may be worse over the last few days. She states she's taking morphine and oxycodone at the facility but it is not helping. She states they will not increase the dose because they tell her she's not in not much pain. She denies fever, abdominal pain, vomiting, chest pain or shortness of breath. She states they wrapped her leg every other day and had been doing dressings for chronic venous ulcers.  She states her pain is at 10 and sharp in nature. Nothing makes it better nothing makes it worse  The history is provided by the patient.    Past Medical History  Diagnosis Date  . COPD   . Hypertension   . Chronic respiratory failure     3 L oxygen  . GERD (gastroesophageal reflux disease)   . Diabetes mellitus   . Obesity, Class III, BMI 40-49.9 (morbid obesity)   . Obstructive sleep apnea on CPAP   . Peripheral vascular disease   . Bipolar disease, chronic   . Lymphedema   . COPD (chronic obstructive pulmonary disease)   . Cellulitis   . Coronary artery disease   . Asthma   . Myocardial infarction   . Vertigo     Past Surgical History  Procedure Date  . Abdominal hysterectomy   . Cholecystectomy   . Appendectomy   . Tubal ligation     Family History  Problem Relation Age of Onset  . Prostate cancer      History  Substance Use Topics  . Smoking status: Former Games developer  . Smokeless tobacco: Not on file  . Alcohol Use: No    OB History    Grav Para Term Preterm Abortions TAB SAB Ect Mult Living                  Review of Systems    Constitutional: Negative for fever, chills and appetite change.  Respiratory: Negative for cough and shortness of breath.   Cardiovascular: Positive for leg swelling. Negative for chest pain.  Gastrointestinal: Negative for nausea, vomiting, abdominal pain and diarrhea.  Genitourinary: Negative for dysuria.  Musculoskeletal:       Leg swelling, pain and chronic ulcers on the left lower leg  Skin: Positive for wound.  All other systems reviewed and are negative.    Allergies  Bee venom; Nyquil cold &; and Penicillins  Home Medications   Current Outpatient Rx  Name  Route  Sig  Dispense  Refill  . ALBUTEROL SULFATE (2.5 MG/3ML) 0.083% IN NEBU   Nebulization   Take 3 mLs (2.5 mg total) by nebulization every 6 (six) hours as needed for wheezing.   75 mL   12   . ARIPIPRAZOLE 5 MG PO TABS   Oral   Take 5 mg by mouth daily.         . ASPIRIN 325 MG PO TABS   Oral   Take 325 mg by mouth daily.         . BUDESONIDE-FORMOTEROL FUMARATE 160-4.5 MCG/ACT IN AERO   Inhalation   Inhale 2  puffs into the lungs 2 (two) times daily.         . BUSPIRONE HCL 10 MG PO TABS   Oral   Take 10 mg by mouth 2 (two) times daily.         Marland Kitchen CALCIUM CARBONATE-VITAMIN D 500-200 MG-UNIT PO TABS   Oral   Take 1 tablet by mouth 2 (two) times daily.         Marland Kitchen CLONAZEPAM 0.5 MG PO TABS   Oral   Take 0.5 mg by mouth daily.         Marland Kitchen CLONIDINE HCL 0.2 MG PO TABS   Oral   Take 0.2 mg by mouth 2 (two) times daily.         Marland Kitchen DIVALPROEX SODIUM ER 500 MG PO TB24   Oral   Take 500 mg by mouth 2 (two) times daily.         . DULOXETINE HCL 60 MG PO CPEP   Oral   Take 60 mg by mouth 2 (two) times daily.         Marland Kitchen PRO-STAT 64 PO LIQD   Oral   Take 30 mLs by mouth 2 (two) times daily.         Marland Kitchen FLUTICASONE PROPIONATE 50 MCG/ACT NA SUSP   Nasal   Place 2 sprays into the nose daily.         Marland Kitchen FOLIC ACID 0.5 MG HALF TAB   Oral   Take 1 mg by mouth daily.         .  FUROSEMIDE 40 MG PO TABS   Oral   Take 40 mg by mouth daily.         Marland Kitchen GABAPENTIN 400 MG PO CAPS   Oral   Take 400 mg by mouth 3 (three) times daily.         . ISOSORBIDE MONONITRATE ER 30 MG PO TB24   Oral   Take 30 mg by mouth daily.         Marland Kitchen MECLIZINE HCL 25 MG PO TABS   Oral   Take 25 mg by mouth 2 (two) times daily as needed. For dizziness         . MORPHINE SULFATE ER PO   Oral   Take 15 mg by mouth 2 (two) times daily.         . CERTAGEN PO   Oral   Take 1 tablet by mouth daily.         Marland Kitchen NITROGLYCERIN 0.4 MG SL SUBL   Sublingual   Place 0.4 mg under the tongue every 5 (five) minutes as needed.         . OLOPATADINE HCL 0.2 % OP SOLN   Ophthalmic   Apply 1 drop to eye daily. In each eye         . OXYCODONE-ACETAMINOPHEN 5-325 MG PO TABS   Oral   Take 1 tablet by mouth every 4 (four) hours as needed. For pain         . PANTOPRAZOLE SODIUM 40 MG PO TBEC   Oral   Take 40 mg by mouth 2 (two) times daily.         . SENNA 8.6 MG PO TABS   Oral   Take 2 tablets by mouth daily.         Marland Kitchen TIOTROPIUM BROMIDE MONOHYDRATE 18 MCG IN CAPS   Inhalation   Place 1 capsule (18 mcg total) into inhaler and inhale  daily.   30 capsule   12   . VITAMIN D 400 UNITS PO TABS   Oral   Take 400 Units by mouth daily.         Marland Kitchen ZOLPIDEM TARTRATE 5 MG PO TABS   Oral   Take 5 mg by mouth at bedtime as needed. For sleep           BP 170/72  Pulse 91  Temp 99 F (37.2 C) (Oral)  Resp 20  SpO2 94%  Physical Exam  Nursing note and vitals reviewed. Constitutional: She is oriented to person, place, and time. She appears well-developed and well-nourished. No distress.       Morbid obesity  HENT:  Head: Normocephalic and atraumatic.  Mouth/Throat: Oropharynx is clear and moist.  Eyes: Conjunctivae normal and EOM are normal. Pupils are equal, round, and reactive to light.  Neck: Normal range of motion. Neck supple.  Cardiovascular: Normal rate,  regular rhythm and intact distal pulses.   No murmur heard. Pulmonary/Chest: Effort normal and breath sounds normal. No respiratory distress. She has no wheezes. She has no rales.  Abdominal: Soft. She exhibits no distension. There is no tenderness. There is no rebound and no guarding.  Musculoskeletal: Normal range of motion. She exhibits no edema and no tenderness.       Legs:      2+ radial pulses bilateral and no signs of abnormalities.  Neurological: She is alert and oriented to person, place, and time.  Skin: Skin is warm and dry. No rash noted. There is erythema.  Psychiatric: She has a normal mood and affect. Her behavior is normal.    ED Course  Procedures (including critical care time)  Labs Reviewed  CBC WITH DIFFERENTIAL - Abnormal; Notable for the following:    RBC 3.48 (*)     Hemoglobin 9.1 (*)     HCT 29.5 (*)     All other components within normal limits  BASIC METABOLIC PANEL - Abnormal; Notable for the following:    Chloride 87 (*)     CO2 45 (*)     Glucose, Bld 112 (*)     GFR calc non Af Amer 58 (*)     GFR calc Af Amer 68 (*)     All other components within normal limits  URINALYSIS, ROUTINE W REFLEX MICROSCOPIC - Abnormal; Notable for the following:    Leukocytes, UA SMALL (*)     All other components within normal limits  URINE MICROSCOPIC-ADD ON - Abnormal; Notable for the following:    Squamous Epithelial / LPF FEW (*)     All other components within normal limits   No results found.   1. Venous stasis ulcer   2. Lymphedema       MDM   Pt is here c/o of pain in bilateral lower and upper ext pain.  States she is taking meds at the nursing home but pain persists and she states they tell her she is not in that much pain.  She is taking morphine and percocet.  Pt states her legs hurt but no fever, vomiting, abd pain or diarrhea.  She denies urinary sx. On exam pt is morbidly obese and NV intact.  She has chronic lymphedema of the legs with  bilateral venous stasis ulcers on the legs without signs of cellulitis.  2+ radial pulses.  Normal heart and lungs.  CBC, BMP and UA pending.  Feel most likely pt having exacerbation of her chronic  pain.  She was given dilaudid here for her pain.  11:57 PM Pt sleeping comfortably but when awake her she is still c/o of pain.  12:30 AM Labs are unrevealing. Expected elevated CO2 that is chronic due to lung disease.  Will d/c pt back to NH to get a pain management consult.  Pt sleeping well and discussed that she would be sent back to facility.    Gwyneth Sprout, MD 06/29/12 [id number]  Gwyneth Sprout, MD 06/29/12 1308  Gwyneth Sprout, MD 06/29/12 6578

## 2012-06-29 LAB — BASIC METABOLIC PANEL
BUN: 17 mg/dL (ref 6–23)
Calcium: 9.4 mg/dL (ref 8.4–10.5)
GFR calc Af Amer: 68 mL/min — ABNORMAL LOW (ref >90)
GFR calc non Af Amer: 58 mL/min — ABNORMAL LOW (ref >90)
Glucose, Bld: 112 mg/dL — ABNORMAL HIGH (ref 70–99)
Sodium: 135 mEq/L (ref 135–145)

## 2012-06-29 LAB — URINALYSIS, ROUTINE W REFLEX MICROSCOPIC
Bilirubin Urine: NEGATIVE
Hgb urine dipstick: NEGATIVE
Nitrite: NEGATIVE
Protein, ur: NEGATIVE mg/dL
Urobilinogen, UA: 0.2 mg/dL (ref 0.0–1.0)

## 2012-06-29 NOTE — ED Notes (Signed)
Left leg cleaned and rewrapped with vaseline gauze and coban.

## 2012-09-08 IMAGING — CR DG CHEST 2V
2 series · 2 of 2 positions shown · non-contrast
Comparison: Chest radiograph performed 09/16/2010

CLINICAL DATA: Cough; hypoxia.  Shortness of breath.  Diffuse
rhonchi.

CHEST - 2 VIEW

[w chest lat]
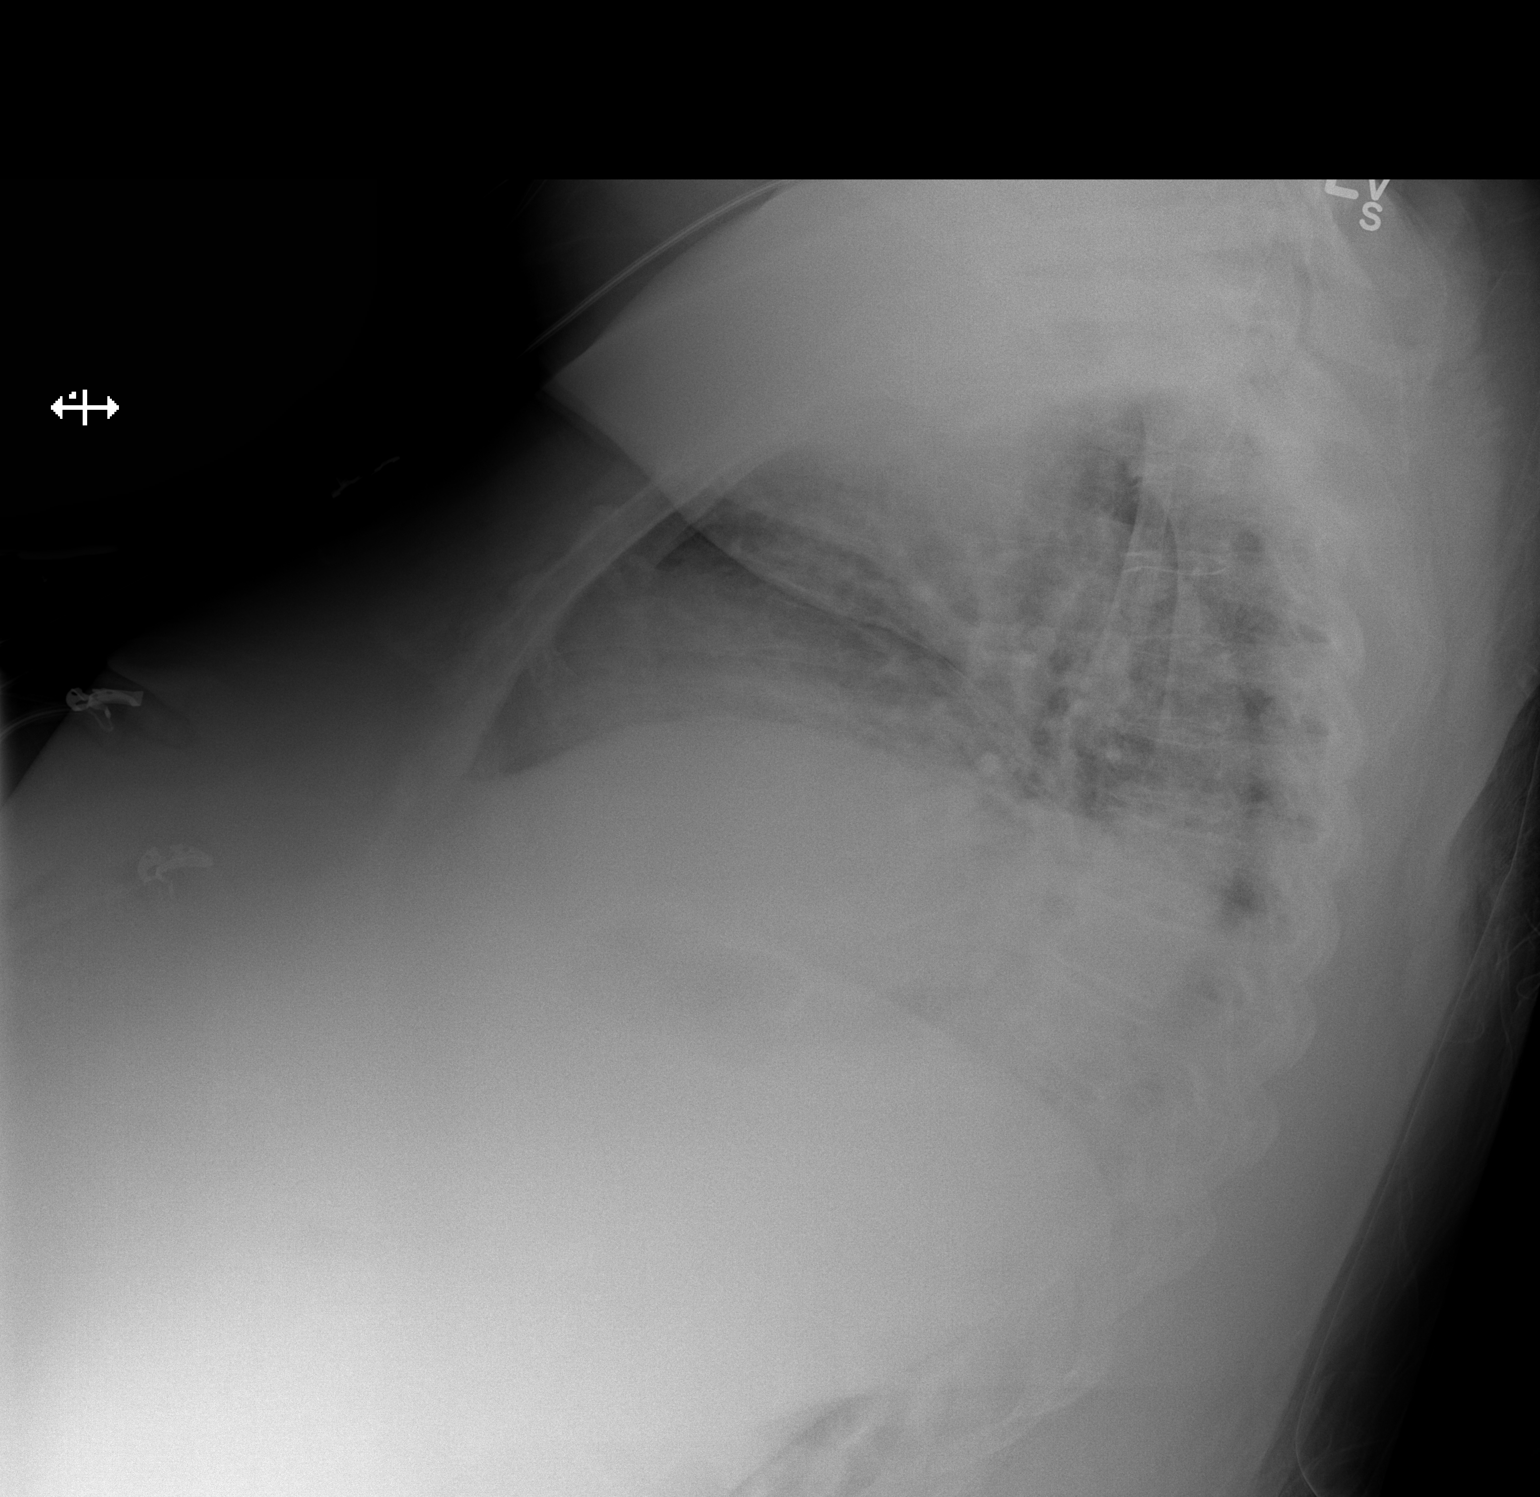

[x chest ap]
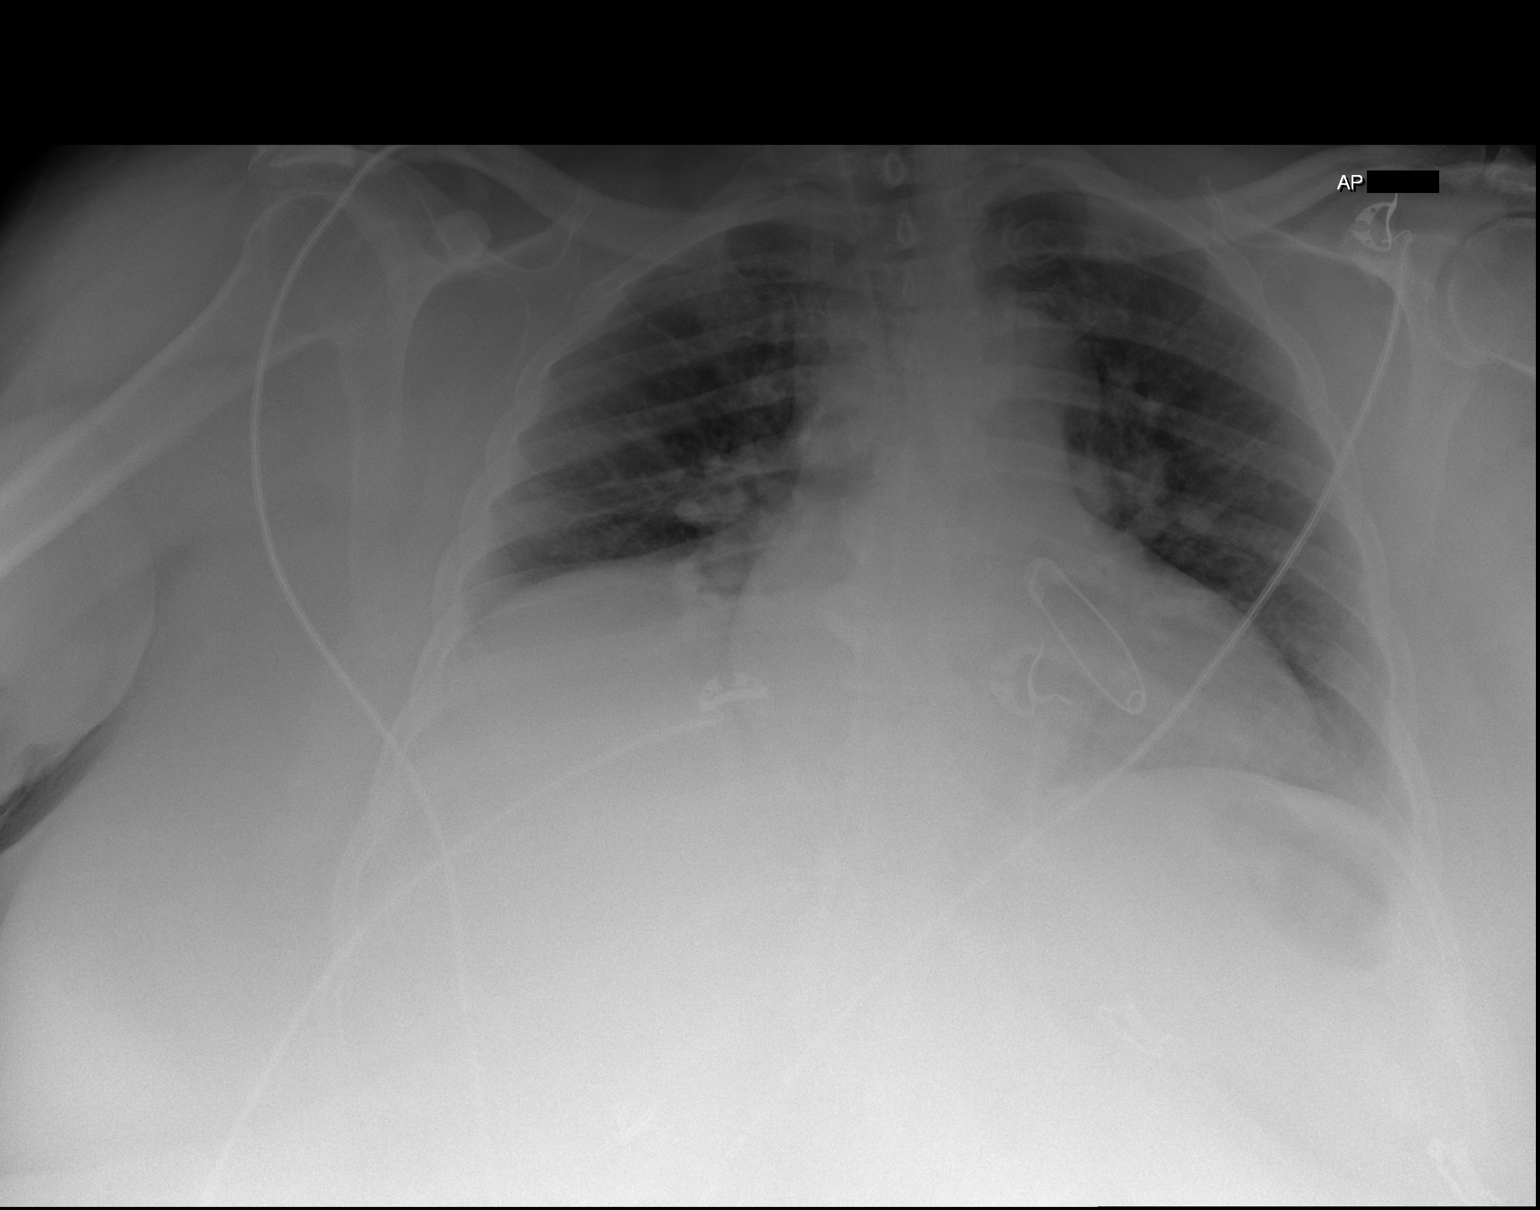

[2 of 2 positions shown; findings below may reference images not displayed]

FINDINGS: The lungs remain hypoexpanded.  There is elevation of the
right hemidiaphragm, as on the prior study.  Vascular crowding and
vascular congestion are seen.  Given increased interstitial
markings, mild interstitial edema may be present.  No definite
focal airspace consolidation is seen to suggest pneumonia, though
evaluation is suboptimal due to lung hypoexpansion.  No pleural
effusion or pneumothorax is seen.

The cardiomediastinal silhouette is mildly enlarged.  No acute
osseous abnormalities are identified.
IMPRESSION: 1.  Lungs remain hypoexpanded; no definite focal airspace
consolidation seen to suggest pneumonia, though evaluation is
suboptimal due to lung hypoexpansion.
2.  Vascular congestion and mild cardiomegaly; given increased
interstitial markings, mild interstitial edema may be present.
3.  Elevation of the right hemidiaphragm again noted.

## 2012-10-05 ENCOUNTER — Other Ambulatory Visit: Payer: Self-pay | Admitting: *Deleted

## 2012-10-05 MED ORDER — MORPHINE SULFATE 15 MG PO TABS: ORAL_TABLET | ORAL | Status: DC

## 2012-10-15 ENCOUNTER — Other Ambulatory Visit: Payer: Self-pay | Admitting: *Deleted

## 2012-10-15 MED ORDER — CLONAZEPAM 0.5 MG PO TABS: ORAL_TABLET | ORAL | Status: DC

## 2012-10-18 ENCOUNTER — Encounter: Payer: Self-pay | Admitting: Adult Health

## 2012-10-18 ENCOUNTER — Non-Acute Institutional Stay (SKILLED_NURSING_FACILITY): Payer: Medicaid Other | Admitting: Adult Health

## 2012-10-18 DIAGNOSIS — G4733 Obstructive sleep apnea (adult) (pediatric): Secondary | ICD-10-CM

## 2012-10-18 DIAGNOSIS — I5031 Acute diastolic (congestive) heart failure: Secondary | ICD-10-CM

## 2012-10-18 DIAGNOSIS — F319 Bipolar disorder, unspecified: Secondary | ICD-10-CM

## 2012-10-18 DIAGNOSIS — K219 Gastro-esophageal reflux disease without esophagitis: Secondary | ICD-10-CM

## 2012-10-18 DIAGNOSIS — J449 Chronic obstructive pulmonary disease, unspecified: Secondary | ICD-10-CM

## 2012-10-18 DIAGNOSIS — I509 Heart failure, unspecified: Secondary | ICD-10-CM

## 2012-10-18 DIAGNOSIS — I10 Essential (primary) hypertension: Secondary | ICD-10-CM

## 2012-10-18 DIAGNOSIS — R079 Chest pain, unspecified: Secondary | ICD-10-CM

## 2012-10-18 DIAGNOSIS — K5900 Constipation, unspecified: Secondary | ICD-10-CM

## 2012-10-18 DIAGNOSIS — G8929 Other chronic pain: Secondary | ICD-10-CM

## 2012-10-22 ENCOUNTER — Non-Acute Institutional Stay (SKILLED_NURSING_FACILITY): Payer: Medicaid Other | Admitting: Internal Medicine

## 2012-10-22 DIAGNOSIS — R05 Cough: Secondary | ICD-10-CM

## 2012-10-22 DIAGNOSIS — R0600 Dyspnea, unspecified: Secondary | ICD-10-CM

## 2012-10-22 DIAGNOSIS — R0989 Other specified symptoms and signs involving the circulatory and respiratory systems: Secondary | ICD-10-CM

## 2012-10-22 IMAGING — CT CT CERVICAL SPINE W/O CM
3 of 7 series · 11 of 33 positions shown, 13 images · non-contrast
Comparison: CT head without contrast 09/16/2010.  CT head and
cervical spine 04/02/2010.

CT HEAD

CLINICAL DATA: Fall.  Loss of consciousness.  Posterior headache.
Diffuse neck pain.

CT HEAD WITHOUT CONTRAST
CT CERVICAL SPINE WITHOUT CONTRAST
TECHNIQUE: Multidetector CT imaging of the head and cervical spine
was performed following the standard protocol without intravenous
contrast.  Multiplanar CT image reconstructions of the cervical
spine were also generated.

[Series 4: cervical spine · axial · 0.34mm/px · z∈[-315,-120]mm · 3 of 79 slices shown, 4 images]
[im 1/79  soft-tissue]
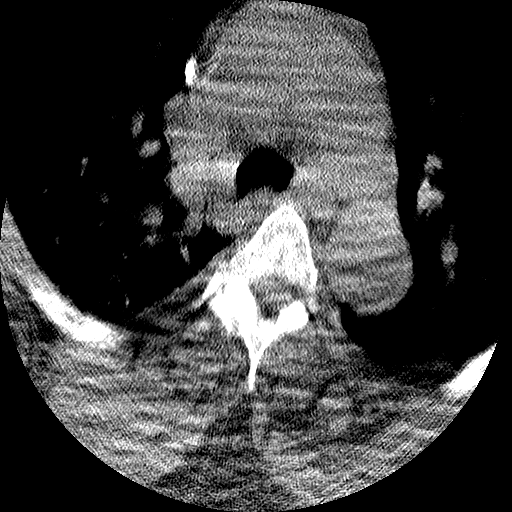
[im 1/79  bone]
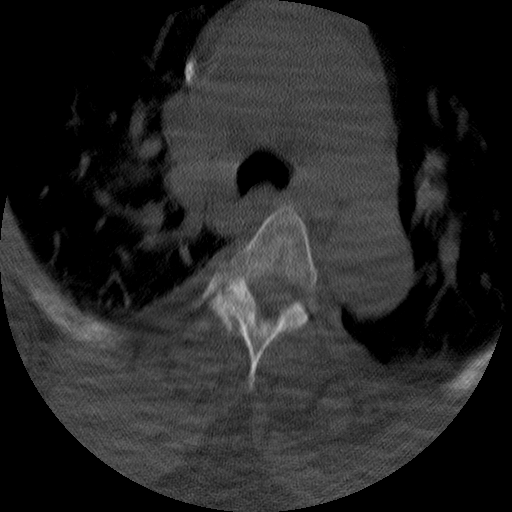
[im 40/79  bone]
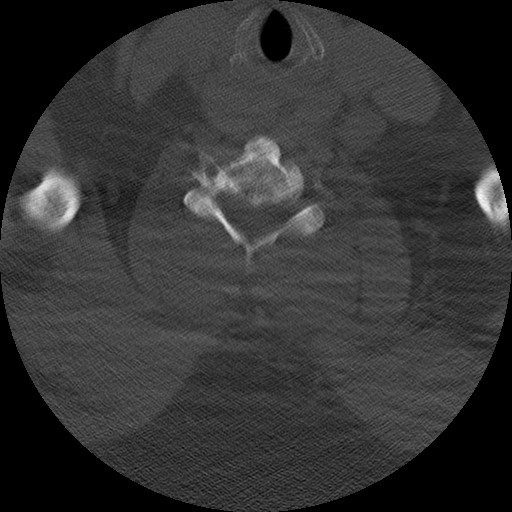
[im 79/79  bone]
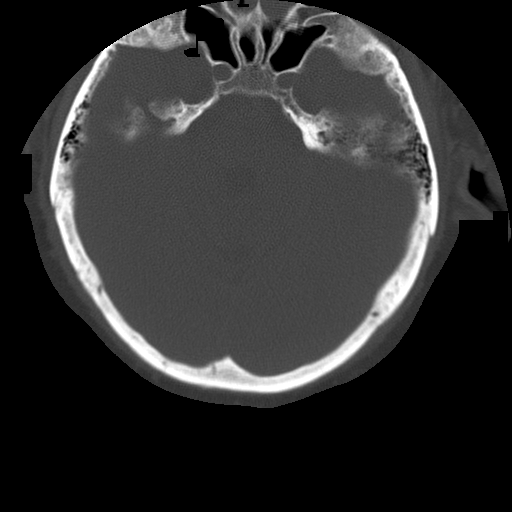

[Series 600: sag · sagittal · 0.39mm/px · 5 of 70 slices shown, 6 images]
[im 24/70  bone]
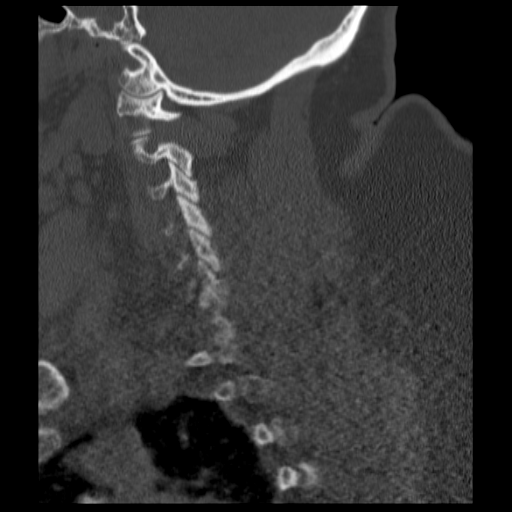
[im 29/70  bone]
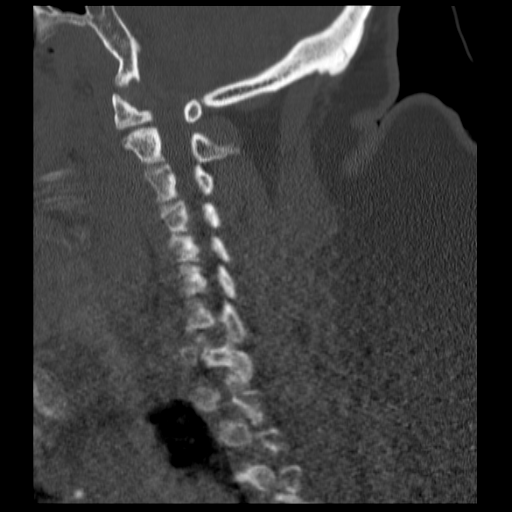
[im 35/70  soft-tissue]
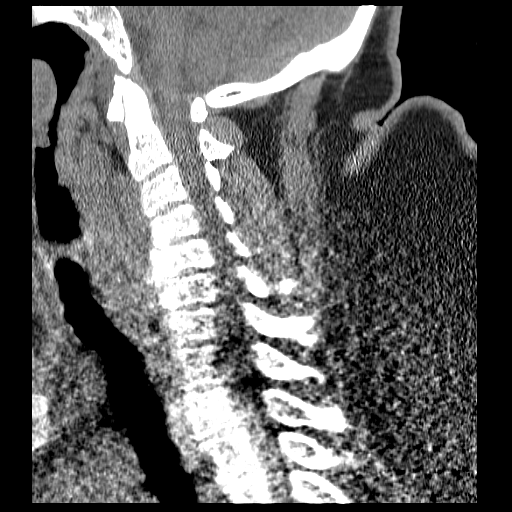
[im 35/70  bone]
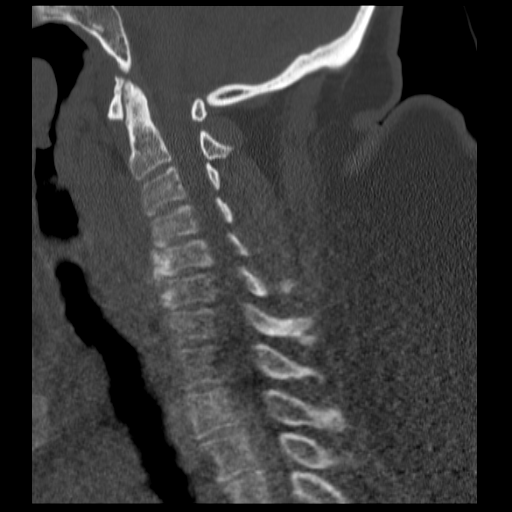
[im 41/70  bone]
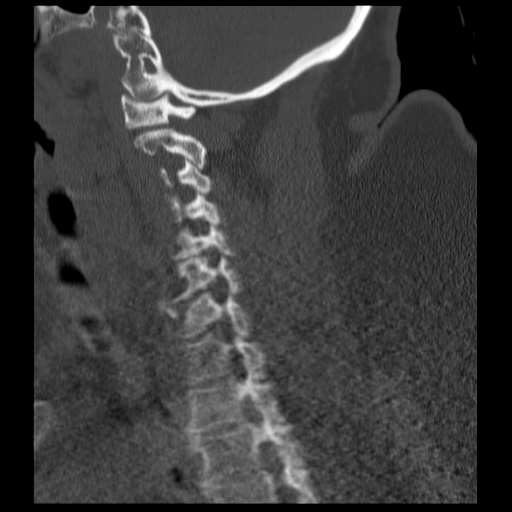
[im 47/70  bone]
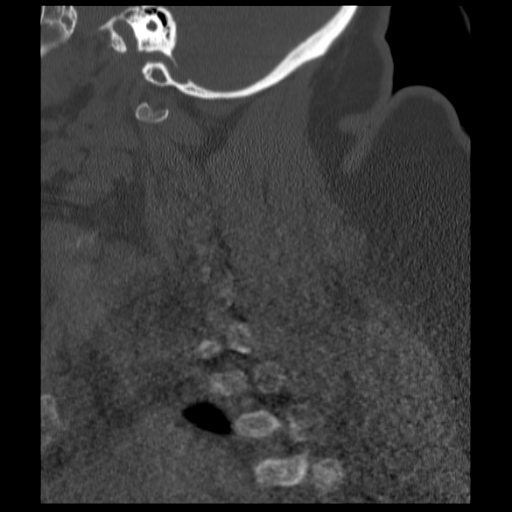

[Series 601: coronal · coronal · 0.39mm/px · 3 of 63 slices shown]
[im 13/63  bone]
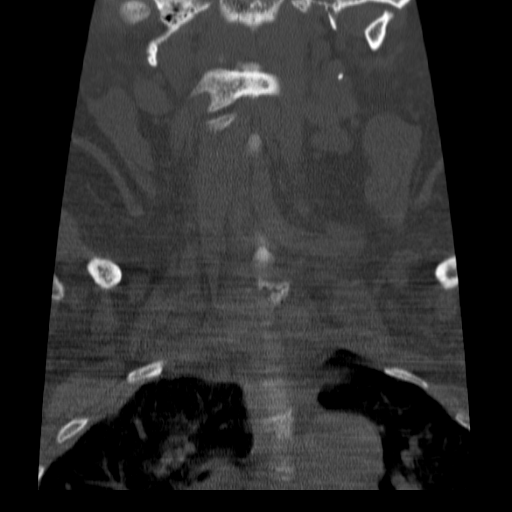
[im 25/63  bone]
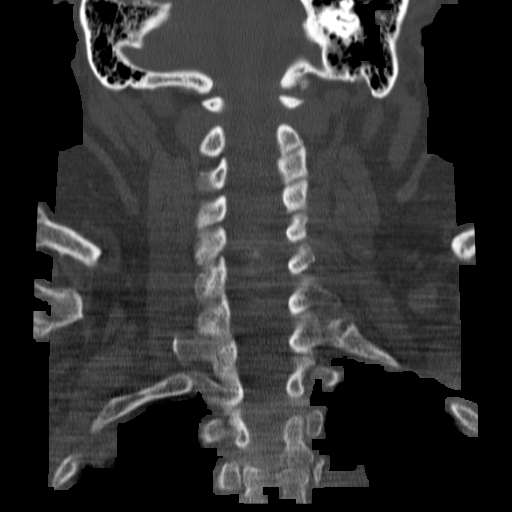
[im 38/63  bone]
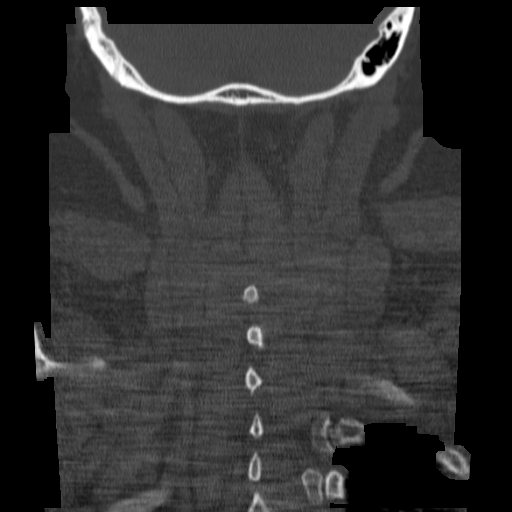

[11 of 33 positions shown; findings below may reference images not displayed]

FINDINGS: Atrophy and moderate white matter disease is similar to
the prior exam.  No lacunar infarcts of the basal ganglia
bilaterally are remote.  The ventricles are stable.  No significant
extra-axial fluid collection is present.

The paranasal sinuses and mastoid air cells are clear.  The osseous
skull is intact.  The no significant extracranial soft tissue
injuries are evident.
IMPRESSION: 1.  No acute intracranial abnormality or significant interval
change.
2.  Stable atrophy and white matter disease.
3.  Stable remote lacunar infarcts of the basal ganglia, more
prominently on the left.

CT CERVICAL SPINE
FINDINGS: The cervical spine is imaged from skull base through T3.
The bone detail is degraded by patient body habitus.  The anterior
osteophytes are present at C4-5, C5-6, and C6-7.  There is some
straightening of the normal cervical lordosis. The soft tissues of
the neck are unremarkable.  Scattered ground-glass attenuation in
the upper lobes bilaterally is most compatible with edema or
atelectasis.
IMPRESSION: 1.  Moderate spondylosis at C5-6 and C6-7.
2.  No acute fracture or traumatic subluxation.
3.  Ground-glass attenuation at the lung apices bilaterally.  This
may represent atelectasis or edema.  Infection is also considered.

## 2012-11-01 NOTE — Progress Notes (Signed)
Patient ID: [name], female   DOB: 01-26-1951, 62 y.o.   MRN: [id number]        PROGRESS NOTE  DATE:  10/22/2012   FACILITY: [facility]   LEVEL OF CARE: SNF  Acute Visit  CHIEF COMPLAINT:  Manage cough and congestion.    HISTORY OF PRESENT ILLNESS: I was requested by the staff to assess the patient regarding above problem(s):  Staff report that patient is having a productive cough and congestion as well as weakness for several days.  Patient also admits to increased shortness of breath but denies fever, chills or night sweats.  She cannot identify precipitating or alleviating factors.  There is no temporal relationship.  She has end-stage COPD.    PAST MEDICAL HISTORY : Reviewed.  No changes.  CURRENT MEDICATIONS: Reviewed per [facility]  REVIEW OF SYSTEMS:  GENERAL: no change in appetite, no fatigue, no weight changes, no fever, chills or weakness RESPIRATORY: see HPI  CARDIAC: no chest pain, palpitations, bilateral lower extremity swelling  GI: no abdominal pain, diarrhea, constipation, heart burn, nausea or vomiting  PHYSICAL EXAMINATION  VS:  T 99       P 84      RR  22   BP 120/70      POX %       WT (Lb)  GENERAL: no acute distress, morbidly obese body habitus NECK: supple, trachea midline, no neck masses, no thyroid tenderness, no thyromegaly LYMPHATICS: no LAN in the neck, no supraclavicular LAN RESPIRATORY: decreased breath sounds, breathing is even & unlabored, BS CTAB CARDIAC: RRR, no murmur,no extra heart sounds EDEMA/VARICOSITIES:  +2 bilateral lower extremity edema  ARTERIAL:  pedal pulses nonpalpable, left lower extremity is wrapped PSYCHIATRIC: the patient is alert & oriented to person, affect & behavior appropriate  ASSESSMENT/PLAN:  Cough.    Dyspnea.    New problems.  Obtain chest x-ray.  Start Mucinex 1200 mg b.i.d. for one week.    CPT CODE: 46962

## 2012-11-05 ENCOUNTER — Inpatient Hospital Stay (HOSPITAL_COMMUNITY): Payer: Medicaid Other

## 2012-11-05 ENCOUNTER — Emergency Department (HOSPITAL_COMMUNITY): Payer: Medicaid Other

## 2012-11-05 ENCOUNTER — Other Ambulatory Visit: Payer: Self-pay | Admitting: Geriatric Medicine

## 2012-11-05 ENCOUNTER — Non-Acute Institutional Stay (SKILLED_NURSING_FACILITY): Payer: Medicaid Other | Admitting: Internal Medicine

## 2012-11-05 ENCOUNTER — Inpatient Hospital Stay (HOSPITAL_COMMUNITY)
Admission: EM | Admit: 2012-11-05 | Discharge: 2012-11-12 | DRG: 070 | Disposition: A | Discharge status: Skilled Nursing Facility | Payer: Medicaid Other | Attending: Internal Medicine | Admitting: Internal Medicine

## 2012-11-05 DIAGNOSIS — E1159 Type 2 diabetes mellitus with other circulatory complications: Secondary | ICD-10-CM | POA: Diagnosis present

## 2012-11-05 DIAGNOSIS — E1149 Type 2 diabetes mellitus with other diabetic neurological complication: Secondary | ICD-10-CM

## 2012-11-05 DIAGNOSIS — E662 Morbid (severe) obesity with alveolar hypoventilation: Secondary | ICD-10-CM

## 2012-11-05 DIAGNOSIS — R0902 Hypoxemia: Secondary | ICD-10-CM

## 2012-11-05 DIAGNOSIS — K219 Gastro-esophageal reflux disease without esophagitis: Secondary | ICD-10-CM

## 2012-11-05 DIAGNOSIS — Z6841 Body Mass Index (BMI) 40.0 and over, adult: Secondary | ICD-10-CM

## 2012-11-05 DIAGNOSIS — I798 Other disorders of arteries, arterioles and capillaries in diseases classified elsewhere: Secondary | ICD-10-CM | POA: Diagnosis present

## 2012-11-05 DIAGNOSIS — J9620 Acute and chronic respiratory failure, unspecified whether with hypoxia or hypercapnia: Secondary | ICD-10-CM | POA: Diagnosis present

## 2012-11-05 DIAGNOSIS — J4489 Other specified chronic obstructive pulmonary disease: Secondary | ICD-10-CM | POA: Diagnosis present

## 2012-11-05 DIAGNOSIS — IMO0002 Reserved for concepts with insufficient information to code with codable children: Secondary | ICD-10-CM

## 2012-11-05 DIAGNOSIS — I252 Old myocardial infarction: Secondary | ICD-10-CM

## 2012-11-05 DIAGNOSIS — G4733 Obstructive sleep apnea (adult) (pediatric): Secondary | ICD-10-CM | POA: Diagnosis present

## 2012-11-05 DIAGNOSIS — J309 Allergic rhinitis, unspecified: Secondary | ICD-10-CM

## 2012-11-05 DIAGNOSIS — G9340 Encephalopathy, unspecified: Principal | ICD-10-CM | POA: Diagnosis present

## 2012-11-05 DIAGNOSIS — D638 Anemia in other chronic diseases classified elsewhere: Secondary | ICD-10-CM

## 2012-11-05 DIAGNOSIS — Z7982 Long term (current) use of aspirin: Secondary | ICD-10-CM

## 2012-11-05 DIAGNOSIS — Z794 Long term (current) use of insulin: Secondary | ICD-10-CM

## 2012-11-05 DIAGNOSIS — G8929 Other chronic pain: Secondary | ICD-10-CM | POA: Diagnosis present

## 2012-11-05 DIAGNOSIS — I10 Essential (primary) hypertension: Secondary | ICD-10-CM

## 2012-11-05 DIAGNOSIS — L0390 Cellulitis, unspecified: Secondary | ICD-10-CM

## 2012-11-05 DIAGNOSIS — F29 Unspecified psychosis not due to a substance or known physiological condition: Secondary | ICD-10-CM | POA: Diagnosis present

## 2012-11-05 DIAGNOSIS — J449 Chronic obstructive pulmonary disease, unspecified: Secondary | ICD-10-CM | POA: Diagnosis present

## 2012-11-05 DIAGNOSIS — F319 Bipolar disorder, unspecified: Secondary | ICD-10-CM | POA: Diagnosis present

## 2012-11-05 DIAGNOSIS — E119 Type 2 diabetes mellitus without complications: Secondary | ICD-10-CM | POA: Diagnosis present

## 2012-11-05 DIAGNOSIS — J189 Pneumonia, unspecified organism: Secondary | ICD-10-CM | POA: Diagnosis present

## 2012-11-05 DIAGNOSIS — I509 Heart failure, unspecified: Secondary | ICD-10-CM

## 2012-11-05 DIAGNOSIS — Z87891 Personal history of nicotine dependence: Secondary | ICD-10-CM

## 2012-11-05 DIAGNOSIS — I2510 Atherosclerotic heart disease of native coronary artery without angina pectoris: Secondary | ICD-10-CM | POA: Diagnosis present

## 2012-11-05 LAB — POCT I-STAT 3, ART BLOOD GAS (G3+)
Acid-Base Excess: 22 mmol/L — ABNORMAL HIGH (ref 0.0–2.0)
Bicarbonate: 51.4 mEq/L — ABNORMAL HIGH (ref 20.0–24.0)
Bicarbonate: 53.2 mEq/L — ABNORMAL HIGH (ref 20.0–24.0)
Bicarbonate: 51.6 mEq/L — ABNORMAL HIGH (ref 20.0–24.0)
O2 Saturation: 99 %
O2 Saturation: 96 %
Patient temperature: 98.6
TCO2: 46 mmol/L (ref 0–100)
TCO2: >50 mmol/L (ref 0–100)
TCO2: >50 mmol/L (ref 0–100)
pCO2 arterial: 77.2 mmHg (ref 35.0–45.0)
pCO2 arterial: 110.8 mmHg (ref 35.0–45.0)
pH, Arterial: 7.274 — ABNORMAL LOW (ref 7.350–7.450)
pH, Arterial: 7.263 — ABNORMAL LOW (ref 7.350–7.450)
pH, Arterial: 7.368 (ref 7.350–7.450)
pO2, Arterial: 172 mmHg — ABNORMAL HIGH (ref 80.0–100.0)
pO2, Arterial: 159 mmHg — ABNORMAL HIGH (ref 80.0–100.0)
pO2, Arterial: 141 mmHg — ABNORMAL HIGH (ref 80.0–100.0)

## 2012-11-05 LAB — POCT I-STAT, CHEM 8
BUN: 12 mg/dL (ref 6–23)
Creatinine, Ser: 1 mg/dL (ref 0.50–1.10)
Glucose, Bld: 148 mg/dL — ABNORMAL HIGH (ref 70–99)
HCT: 33 % — ABNORMAL LOW (ref 36.0–46.0)
Hemoglobin: 11.2 g/dL — ABNORMAL LOW (ref 12.0–15.0)
Potassium: 3.5 mEq/L (ref 3.5–5.1)
Sodium: 137 mEq/L (ref 135–145)
TCO2: 48 mmol/L (ref 0–100)

## 2012-11-05 LAB — GLUCOSE, CAPILLARY: Glucose-Capillary: 108 mg/dL — ABNORMAL HIGH (ref 70–99)

## 2012-11-05 LAB — COMPREHENSIVE METABOLIC PANEL
ALT: 19 U/L (ref 0–35)
Alkaline Phosphatase: 80 U/L (ref 39–117)
CO2: 43 mEq/L (ref 19–32)
Chloride: 89 mEq/L — ABNORMAL LOW (ref 96–112)
GFR calc Af Amer: 89 mL/min — ABNORMAL LOW (ref >90)
GFR calc non Af Amer: 77 mL/min — ABNORMAL LOW (ref >90)
Glucose, Bld: 147 mg/dL — ABNORMAL HIGH (ref 70–99)
Potassium: 3.6 mEq/L (ref 3.5–5.1)
Sodium: 139 mEq/L (ref 135–145)
Total Bilirubin: 0.2 mg/dL — ABNORMAL LOW (ref 0.3–1.2)

## 2012-11-05 LAB — TROPONIN I: Troponin I: <0.3 ng/mL (ref <0.30)

## 2012-11-05 MED ORDER — MORPHINE SULFATE 15 MG PO TABS: ORAL_TABLET | ORAL | Status: DC

## 2012-11-05 MED ORDER — VANCOMYCIN HCL 10 G IV SOLR
2500.0000 mg | Freq: Once | INTRAVENOUS | Status: AC
  Administered 2012-11-05: 2500 mg via INTRAVENOUS
  Filled 2012-11-05: qty 2500

## 2012-11-05 MED ORDER — SUCCINYLCHOLINE CHLORIDE 20 MG/ML IJ SOLN
INTRAMUSCULAR | Status: AC
  Filled 2012-11-05: qty 1

## 2012-11-05 MED ORDER — IPRATROPIUM BROMIDE HFA 17 MCG/ACT IN AERS
6.0000 | INHALATION_SPRAY | RESPIRATORY_TRACT | Status: DC
  Administered 2012-11-05 – 2012-11-08 (×15): 6 via RESPIRATORY_TRACT
  Filled 2012-11-05: qty 12.9

## 2012-11-05 MED ORDER — PANTOPRAZOLE SODIUM 40 MG IV SOLR
40.0000 mg | INTRAVENOUS | Status: DC
  Administered 2012-11-06 – 2012-11-09 (×4): 40 mg via INTRAVENOUS
  Filled 2012-11-05 (×4): qty 40

## 2012-11-05 MED ORDER — POTASSIUM CHLORIDE 10 MEQ/100ML IV SOLN
10.0000 meq | INTRAVENOUS | Status: AC
  Administered 2012-11-06 (×4): 10 meq via INTRAVENOUS
  Filled 2012-11-05: qty 400

## 2012-11-05 MED ORDER — ASPIRIN 325 MG PO TABS
325.0000 mg | ORAL_TABLET | Freq: Every day | ORAL | Status: DC
  Administered 2012-11-06 – 2012-11-12 (×7): 325 mg via ORAL
  Filled 2012-11-05 (×7): qty 1

## 2012-11-05 MED ORDER — ETOMIDATE 2 MG/ML IV SOLN
INTRAVENOUS | Status: AC
  Filled 2012-11-05: qty 20

## 2012-11-05 MED ORDER — CLONIDINE HCL 0.2 MG PO TABS
0.2000 mg | ORAL_TABLET | Freq: Two times a day (BID) | ORAL | Status: DC
  Administered 2012-11-06 – 2012-11-12 (×13): 0.2 mg via ORAL
  Filled 2012-11-05 (×15): qty 1

## 2012-11-05 MED ORDER — ALBUTEROL SULFATE HFA 108 (90 BASE) MCG/ACT IN AERS
6.0000 | INHALATION_SPRAY | RESPIRATORY_TRACT | Status: DC
  Administered 2012-11-05 – 2012-11-08 (×16): 6 via RESPIRATORY_TRACT
  Filled 2012-11-05 (×2): qty 6.7

## 2012-11-05 MED ORDER — ROCURONIUM BROMIDE 50 MG/5ML IV SOLN
INTRAVENOUS | Status: AC
  Filled 2012-11-05: qty 2

## 2012-11-05 MED ORDER — PROPOFOL 10 MG/ML IV EMUL
5.0000 ug/kg/min | INTRAVENOUS | Status: DC
  Administered 2012-11-05: 70 ug/kg/min via INTRAVENOUS
  Filled 2012-11-05: qty 100

## 2012-11-05 MED ORDER — FUROSEMIDE 10 MG/ML IJ SOLN
40.0000 mg | Freq: Once | INTRAMUSCULAR | Status: AC
  Administered 2012-11-05: 40 mg via INTRAVENOUS
  Filled 2012-11-05: qty 4

## 2012-11-05 MED ORDER — LIDOCAINE HCL (CARDIAC) 20 MG/ML IV SOLN
INTRAVENOUS | Status: AC
  Filled 2012-11-05: qty 5

## 2012-11-05 MED ORDER — HEPARIN SODIUM (PORCINE) 5000 UNIT/ML IJ SOLN
5000.0000 [IU] | Freq: Three times a day (TID) | INTRAMUSCULAR | Status: DC
  Administered 2012-11-06 – 2012-11-12 (×20): 5000 [IU] via SUBCUTANEOUS
  Filled 2012-11-05 (×24): qty 1

## 2012-11-05 MED ORDER — POTASSIUM CHLORIDE 10 MEQ/100ML IV SOLN: 10.0000 meq | INTRAVENOUS | Status: DC

## 2012-11-05 MED ORDER — ETOMIDATE 2 MG/ML IV SOLN
20.0000 mg | Freq: Once | INTRAVENOUS | Status: DC
  Filled 2012-11-05: qty 20

## 2012-11-05 MED ORDER — FUROSEMIDE 10 MG/ML IJ SOLN
40.0000 mg | Freq: Two times a day (BID) | INTRAMUSCULAR | Status: DC
  Administered 2012-11-06 – 2012-11-07 (×4): 40 mg via INTRAVENOUS
  Filled 2012-11-05 (×5): qty 4

## 2012-11-05 MED ORDER — PROPOFOL 10 MG/ML IV EMUL
INTRAVENOUS | Status: AC
  Filled 2012-11-05: qty 100

## 2012-11-05 MED ORDER — ALBUTEROL SULFATE HFA 108 (90 BASE) MCG/ACT IN AERS: 6.0000 | INHALATION_SPRAY | RESPIRATORY_TRACT | Status: DC | PRN

## 2012-11-05 MED ORDER — PROPOFOL 10 MG/ML IV EMUL
5.0000 ug/kg/min | INTRAVENOUS | Status: DC
  Administered 2012-11-05: 20 ug/kg/min via INTRAVENOUS

## 2012-11-05 MED ORDER — BIOTENE DRY MOUTH MT LIQD
1.0000 "application " | Freq: Four times a day (QID) | OROMUCOSAL | Status: DC
  Administered 2012-11-06 – 2012-11-07 (×7): 15 mL via OROMUCOSAL

## 2012-11-05 MED ORDER — CHLORHEXIDINE GLUCONATE 0.12 % MT SOLN
15.0000 mL | Freq: Two times a day (BID) | OROMUCOSAL | Status: DC
  Administered 2012-11-06 – 2012-11-07 (×3): 15 mL via OROMUCOSAL
  Filled 2012-11-05 (×3): qty 15

## 2012-11-05 MED ORDER — ASPIRIN 325 MG PO TABS
325.0000 mg | ORAL_TABLET | Freq: Every day | ORAL | Status: DC
Start: 2012-11-05 — End: 2012-11-05

## 2012-11-05 MED ORDER — CLONIDINE HCL 0.2 MG PO TABS: 0.2000 mg | ORAL_TABLET | Freq: Two times a day (BID) | ORAL | Status: DC

## 2012-11-05 MED ORDER — LISINOPRIL 10 MG PO TABS
10.0000 mg | ORAL_TABLET | Freq: Every day | ORAL | Status: DC
  Administered 2012-11-06: 10 mg via ORAL
  Filled 2012-11-05 (×2): qty 1

## 2012-11-05 MED ORDER — PROPOFOL 10 MG/ML IV EMUL
INTRAVENOUS | Status: AC
  Administered 2012-11-05: 20 ug/kg/min via INTRAVENOUS
  Filled 2012-11-05: qty 100

## 2012-11-05 MED ORDER — LISINOPRIL 10 MG PO TABS: 10.0000 mg | ORAL_TABLET | Freq: Every day | ORAL | Status: DC

## 2012-11-05 MED ORDER — NITROGLYCERIN IN D5W 200-5 MCG/ML-% IV SOLN
10.0000 ug/min | INTRAVENOUS | Status: DC
  Administered 2012-11-05: 10 ug/min via INTRAVENOUS
  Filled 2012-11-05: qty 250

## 2012-11-05 MED ORDER — PANTOPRAZOLE SODIUM 40 MG IV SOLR: 40.0000 mg | INTRAVENOUS | Status: DC

## 2012-11-05 MED ORDER — DEXTROSE 5 % IV SOLN
2.0000 g | Freq: Once | INTRAVENOUS | Status: AC
  Administered 2012-11-05: 2 g via INTRAVENOUS
  Filled 2012-11-05: qty 2

## 2012-11-05 MED ORDER — INSULIN ASPART 100 UNIT/ML ~~LOC~~ SOLN
0.0000 [IU] | SUBCUTANEOUS | Status: DC
  Administered 2012-11-06: 2 [IU] via SUBCUTANEOUS
  Administered 2012-11-08: 3 [IU] via SUBCUTANEOUS

## 2012-11-05 MED ORDER — HEPARIN SODIUM (PORCINE) 5000 UNIT/ML IJ SOLN
5000.0000 [IU] | Freq: Three times a day (TID) | INTRAMUSCULAR | Status: DC
  Filled 2012-11-05: qty 1

## 2012-11-05 MED ORDER — LISINOPRIL 10 MG PO TABS
10.0000 mg | ORAL_TABLET | Freq: Every day | ORAL | Status: DC
  Administered 2012-11-06 – 2012-11-07 (×2): 10 mg via ORAL
  Filled 2012-11-05 (×2): qty 1

## 2012-11-05 MED ORDER — AMLODIPINE BESYLATE 5 MG PO TABS
5.0000 mg | ORAL_TABLET | Freq: Every day | ORAL | Status: DC
Start: 2012-11-05 — End: 2012-11-12
  Administered 2012-11-06 – 2012-11-12 (×7): 5 mg via ORAL
  Filled 2012-11-05 (×8): qty 1

## 2012-11-05 NOTE — Procedures (Signed)
Intubation Procedure Note [name] [id number] 01/14/1951  Procedure: Intubation Indications: Airway protection and maintenance  Procedure Details Consent: Risks of procedure as well as the alternatives and risks of each were explained to the (patient/caregiver).  Consent for procedure obtained. Time Out: Verified patient identification, verified procedure, site/side was marked, verified correct patient position, special equipment/implants available, medications/allergies/relevent history reviewed, required imaging and test results available.  Performed  Maximum sterile technique was used including antiseptics, cap, gloves, gown, hand hygiene, mask and sheet.  MAC and 3    Evaluation Hemodynamic Status: BP stable throughout; O2 sats: stable throughout Patient's Current Condition: stable Complications: No apparent complications Patient did tolerate procedure well. Chest X-ray ordered to verify placement.  CXR: pending.   [name] 11/05/2012

## 2012-11-05 NOTE — ED Provider Notes (Signed)
History     CSN: [id number]  Arrival date & time 11/05/12  1732   First MD Initiated Contact with Patient 11/05/12 1800      Chief Complaint  Patient presents with  . Altered Mental Status    Patient is a 62 y.o. female presenting with shortness of breath. The history is provided by the EMS personnel and medical records.  Shortness of Breath Severity:  Moderate Onset quality:  Sudden Duration:  5 minutes Timing:  Constant Progression:  Partially resolved Chronicity:  Recurrent Context comment:  Had episode of choking on candy at nursing home; candy was dislodged but she became less responsive Risk factors comment:  Morbid obesity; obesity hypoventilation syndrome   Past Medical History  Diagnosis Date  . COPD   . Hypertension   . Chronic respiratory failure     3 L oxygen  . GERD (gastroesophageal reflux disease)   . Diabetes mellitus   . Obesity, Class III, BMI 40-49.9 (morbid obesity)   . Obstructive sleep apnea on CPAP   . Peripheral vascular disease   . Bipolar disease, chronic   . Lymphedema   . COPD (chronic obstructive pulmonary disease)   . Cellulitis   . Coronary artery disease   . Asthma   . Myocardial infarction   . Vertigo     Past Surgical History  Procedure Laterality Date  . Abdominal hysterectomy    . Cholecystectomy    . Appendectomy    . Tubal ligation      Family History  Problem Relation Age of Onset  . Prostate cancer      History  Substance Use Topics  . Smoking status: Former Games developer  . Smokeless tobacco: Not on file  . Alcohol Use: No    OB History   Grav Para Term Preterm Abortions TAB SAB Ect Mult Living                  Review of Systems  Unable to perform ROS: Unstable vital signs  Respiratory: Positive for shortness of breath.     Allergies  Bee venom; Nyquil cold &; and Penicillins  Home Medications   Current Outpatient Rx  Name  Route  Sig  Dispense  Refill  . EXPIRED: albuterol (PROVENTIL) (2.5 MG/3ML)  0.083% nebulizer solution   Nebulization   Take 3 mLs (2.5 mg total) by nebulization every 6 (six) hours as needed for wheezing.   75 mL   12   . ARIPiprazole (ABILIFY) 5 MG tablet   Oral   Take 5 mg by mouth daily.         Marland Kitchen aspirin 325 MG tablet   Oral   Take 325 mg by mouth daily.         . budesonide-formoterol (SYMBICORT) 160-4.5 MCG/ACT inhaler   Inhalation   Inhale 2 puffs into the lungs 2 (two) times daily.         . busPIRone (BUSPAR) 10 MG tablet   Oral   Take 10 mg by mouth 2 (two) times daily.         . calcium-vitamin D (OSCAL WITH D) 500-200 MG-UNIT per tablet   Oral   Take 1 tablet by mouth 2 (two) times daily.         . clonazePAM (KLONOPIN) 0.5 MG tablet      Take one tablet once a day for depression   30 tablet   5   . cloNIDine (CATAPRES) 0.2 MG tablet   Oral  Take 0.2 mg by mouth 2 (two) times daily.         . divalproex (DEPAKOTE ER) 500 MG 24 hr tablet   Oral   Take 500 mg by mouth 2 (two) times daily.         . DULoxetine (CYMBALTA) 60 MG capsule   Oral   Take 60 mg by mouth 2 (two) times daily.         . feeding supplement (PRO-STAT SUGAR FREE 64) LIQD   Oral   Take 30 mLs by mouth 2 (two) times daily.         . fluticasone (FLONASE) 50 MCG/ACT nasal spray   Nasal   Place 2 sprays into the nose daily.         . folic acid (FOLVITE) 0.5 MG tablet   Oral   Take 1 mg by mouth daily.         . furosemide (LASIX) 40 MG tablet   Oral   Take 40 mg by mouth daily.         Marland Kitchen gabapentin (NEURONTIN) 400 MG capsule   Oral   Take 400 mg by mouth 3 (three) times daily.         . isosorbide mononitrate (IMDUR) 30 MG 24 hr tablet   Oral   Take 30 mg by mouth daily.         . meclizine (ANTIVERT) 25 MG tablet   Oral   Take 25 mg by mouth 2 (two) times daily as needed. For dizziness         . morphine (MSIR) 15 MG tablet      Take one tablet by mouth twice daily for pain. Do not crush   60 tablet   0    . MORPHINE SULFATE ER PO   Oral   Take 15 mg by mouth 2 (two) times daily.         . Multiple Vitamins-Minerals (CERTAGEN PO)   Oral   Take 1 tablet by mouth daily.         . nitroGLYCERIN (NITROSTAT) 0.4 MG SL tablet   Sublingual   Place 0.4 mg under the tongue every 5 (five) minutes as needed.         . Olopatadine HCl (PATADAY) 0.2 % SOLN   Ophthalmic   Apply 1 drop to eye daily. In each eye         . oxyCODONE-acetaminophen (PERCOCET) 5-325 MG per tablet   Oral   Take 1 tablet by mouth every 4 (four) hours as needed. For pain         . pantoprazole (PROTONIX) 40 MG tablet   Oral   Take 40 mg by mouth 2 (two) times daily.         Marland Kitchen senna (SENOKOT) 8.6 MG TABS   Oral   Take 2 tablets by mouth daily.         Marland Kitchen EXPIRED: tiotropium (SPIRIVA HANDIHALER) 18 MCG inhalation capsule   Inhalation   Place 1 capsule (18 mcg total) into inhaler and inhale daily.   30 capsule   12   . vitamin D, CHOLECALCIFEROL, 400 UNITS tablet   Oral   Take 400 Units by mouth daily.         Marland Kitchen zolpidem (AMBIEN) 5 MG tablet   Oral   Take 5 mg by mouth at bedtime as needed. For sleep           BP 216/117  Pulse 96  SpO2 99%  Physical Exam  Nursing note and vitals reviewed. Constitutional: She is oriented to person, place, and time. She appears well-developed and well-nourished.  HENT:  Head: Normocephalic and atraumatic.  Right Ear: External ear normal.  Left Ear: External ear normal.  Nose: Nose normal.  Mouth/Throat: Oropharynx is clear and moist. No oropharyngeal exudate.  Eyes: Conjunctivae are normal. Pupils are equal, round, and reactive to light.  Neck: Normal range of motion. Neck supple.  Cardiovascular: Normal rate, regular rhythm, normal heart sounds and intact distal pulses.  Exam reveals no gallop and no friction rub.   No murmur heard. Pulmonary/Chest: Breath sounds normal. She is in respiratory distress.  Abdominal: Soft. Bowel sounds are normal.  She exhibits no distension and no mass. There is no tenderness. There is no rebound and no guarding.  Morbid obesity  Musculoskeletal: Normal range of motion. She exhibits no edema and no tenderness.  Neurological: She is alert and oriented to person, place, and time.  Skin: Skin is warm and dry. Rash noted. There is erythema.  Psychiatric: She has a normal mood and affect. Her behavior is normal. Judgment and thought content normal.    ED Course  Procedures (including critical care time)  Labs Reviewed  POCT I-STAT 3, BLOOD GAS (G3+) - Abnormal; Notable for the following:    pCO2 arterial 77.2 (*)    pO2, Arterial 172.0 (*)    Bicarbonate 43.8 (*)    Acid-Base Excess 15.0 (*)    All other components within normal limits  TROPONIN I  CBC WITH DIFFERENTIAL   Ct Head Wo Contrast  11/05/2012  *RADIOLOGY REPORT*  Clinical Data: 62 year old female with acute encephalopathy. Altered mental status.  CT HEAD WITHOUT CONTRAST  Technique:  Contiguous axial images were obtained from the base of the skull through the vertex without contrast.  Comparison: 10/15/2011.  Findings: Large body habitus.  No acute scalp or orbit soft tissue findings.  Retained secretions in the nasopharynx. Visualized paranasal sinuses and mastoids are clear.  No acute osseous abnormality identified.  Chronic lacunar infarct right caudate nucleus is stable.  Patchy cerebral white matter hypodensity is not significantly changed. Cerebral volume remains normal.  No ventriculomegaly. No midline shift, mass effect, or evidence of mass lesion.  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 No acute intracranial hemorrhage identified. No evidence of cortically based acute infarction identified.  No suspicious intracranial vascular hyperdensity.  IMPRESSION: No acute intracranial abnormality.  Stable appearance of chronic small vessel disease.   Original Report Authenticated By: Erskine Speed, M.D.    Dg Chest Port 1 View  11/05/2012   *RADIOLOGY REPORT*  Clinical Data: Evaluate endotracheal tube  PORTABLE CHEST - 1 VIEW  Comparison: Earlier same day; 09/01/2011  Findings:  Grossly unchanged enlarged cardiac silhouette and mediastinal contours.  Interval intubation with endotracheal tube overlying the tracheal air column, tip approximately 2.9 cm above the carina. Interval placement of enteric tube with tip and side port projecting below the left hemidiaphragm.  Pulmonary vasculature remains indistinct with cephalization of flow.  Grossly unchanged bilateral mid and lower lung heterogeneous air space opacities, right likely greater than left.  Grossly unchanged small bilateral effusions.  Unchanged bones.  IMPRESSION: 1.  Appropriately positioned support apparatus as above.  No pneumothorax. 2.  Persistent finding suggestive of pulmonary edema and bilateral heterogeneous air space opacities, atelectasis versus infiltrate.  3. Suspected small bilateral effusions.   Original Report Authenticated By: Tacey Ruiz, MD    Dg Chest Portable 1  View  11/05/2012  *RADIOLOGY REPORT*  Clinical Data: Respiratory distress, choked on candy, decreased level of consciousness  PORTABLE CHEST - 1 VIEW  Comparison: 09/01/2011; 09/16/2010  Findings:  The examination is degraded secondary to portable technique and patient body habitus.  Grossly unchanged enlarged cardiac silhouette and mediastinal contours.  The pulmonary vasculature is less distinct on the present examination with cephalization of flow.  There is chronic mild elevation of the right hemidiaphragm.  Worsening bibasilar heterogeneous opacities.  Suspected trace bilateral effusions.  No definite pneumothorax.  Unchanged bones.  IMPRESSION: Degraded examination with findings suggestive of pulmonary edema and bibasilar opacities, right greater than left, atelectasis versus infiltrate.   Original Report Authenticated By: Tacey Ruiz, MD     Date: 11/06/2012  Rate: 94 bpm  Rhythm: normal sinus  rhythm  QRS Axis: normal  Intervals: normal  ST/T Wave abnormalities: nonspecific ST changes  Conduction Disutrbances:none  Narrative Interpretation: Normal sinus rhythm without evidence of acute ischemia  Old EKG Reviewed: unchanged    1. CHF exacerbation   2. Obesity hypoventilation syndrome   3. Acute and chronic respiratory failure   4. Diabetes mellitus   5. Esophageal reflux   6. Morbid obesity   7. Obstructive sleep apnea (adult) (pediatric)       MDM  62 yo F w/hx of chronic respiratory failure and obesity hypoventilation syndrome presents after choking episode at nursing home. Witnessed choking spell for 5 minutes and then became less responsive; candy was dislodged prior to arrival here. Patient following commands here and not hypoxic on non-rebreather. Blood gas reveals hypercapnia, similar to baseline. Vocal cords visualized through nasal-scope and no evidence of foreign body.   Exam c/w fluid overload and review of records indicated patient has history of congestive heart failure. CXR supports diagnosis of fluid overload. NTG drip started, IV Lasix administered.   Repeat ABG reveals worsened hypercapnia and mental status appears to be worsening despite aggressive treatment for CHF; pt placed on BiPAP and Critical care consulted.   Repeat ABG worsened and mental status slightly worsened, decision made to intubate. Intubation performed by intensivist (see procedure note). Patient admitted to ICU.     Clemetine Marker, MD 11/06/12 [phone]

## 2012-11-05 NOTE — Progress Notes (Signed)
ANTIBIOTIC CONSULT NOTE - INITIAL  Pharmacy Consult for Vancomycin/ Aztreonam Indication: rule out pneumonia  Allergies  Allergen Reactions  . Ace Inhibitors Other (See Comments)    unknown  . Aspirin Ec (Aspirin) Other (See Comments)    Unknown   . Bee Venom Other (See Comments)    Unknown  . Nyquil Cold & (Dm-Doxylamine-Acetaminophen) Other (See Comments)    Unknown   . Penicillins Other (See Comments)    unknown  . Ppd (Tuberculin Purified Protein Derivative) Other (See Comments)    unknown    Patient Measurements: Height: 5\' 2"  (157.5 cm) Weight: 328 lb (148.78 kg) IBW/kg (Calculated) : 50.1  Vital Signs: Temp: 98 F (36.7 C) (05/05 1830) Temp src: Oral (05/05 1830) BP: 140/66 mmHg (05/05 2130) Pulse Rate: 88 (05/05 2130) Intake/Output from previous day:   Intake/Output from this shift:    Labs:  Recent Labs  11/05/12 1831 11/05/12 1845  HGB  --  11.2*  CREATININE 0.80 1.00   Estimated Creatinine Clearance: 81.4 ml/min (by C-G formula based on Cr of 1). No results found for this basename: VANCOTROUGH, VANCOPEAK, VANCORANDOM, GENTTROUGH, GENTPEAK, GENTRANDOM, TOBRATROUGH, TOBRAPEAK, TOBRARND, AMIKACINPEAK, AMIKACINTROU, AMIKACIN,  in the last 72 hours   Microbiology: No results found for this or any previous visit (from the past 720 hour(s)).  Medical History: Past Medical History  Diagnosis Date  . COPD   . Hypertension   . Chronic respiratory failure     3 L oxygen  . GERD (gastroesophageal reflux disease)   . Diabetes mellitus   . Obesity, Class III, BMI 40-49.9 (morbid obesity)   . Obstructive sleep apnea on CPAP   . Peripheral vascular disease   . Bipolar disease, chronic   . Lymphedema   . COPD (chronic obstructive pulmonary disease)   . Cellulitis   . Coronary artery disease   . Asthma   . Myocardial infarction   . Vertigo     Medications:   (Not in a hospital admission) Assessment: 62 yo F admitted 11/05/2012  After choking on a  piece of candy, after it was dislodged had ongoing respiratory compromise and was intubated. Pharmacy consulted to start vancomycin and aztreonam  Goal of Therapy:  Vancomycin trough level 15-20 mcg/ml  Plan:  1. Vancomycin 2500 mg IV x 1 2. Vancomycin 1550 mg IV q12h 3. Aztreonam 2g IV q8h 4. Follow up SCr, UOP, cultures, clinical course and adjust as clinically indicated.  Thank you for allowing pharmacy to be a part of this patients care team.  Lovenia Kim Pharm.D., BCPS Clinical Pharmacist 11/05/2012 9:45 PM Pager: [phone] Phone: [phone]

## 2012-11-05 NOTE — ED Notes (Signed)
Per report from [facility] pt was is a resident at nursing home [facility] [place]).  Today around 1650 staff noticed that she was slumping down in her chair.  After assessing her they discovered that she was choking after assisting her to the floor they were able to clear the airway.  Pt on EMS arrival was breathing on her own but her LOC did not change and a  Oral airway was placed.  Pt has continued to be bagged on arrival to ER.

## 2012-11-05 NOTE — Progress Notes (Signed)
RT called to draw ABG on this pt with results in chart.  ABG warranted use of bipap at this time.  Pt has used bipap before and is tolerating it well.  ABG in 30 minutes to assess improvement ot need to intubate.

## 2012-11-05 NOTE — Progress Notes (Signed)
NO SUCTION AT THIS.  PT TOLERATED INTUBATION WELL WITH NO COMPLICATIONS.

## 2012-11-05 NOTE — Procedures (Signed)
Name: [name] MRN: [id number] DOB: [birth year]   PROCEDURE NOTE  Procedure:  Endotracheal intubation.  Indication:  Acute respiratory failure  Consent:  Consent was implied due to the emergency nature of the procedure.  Anesthesia:  A total of 10 mg of Etomidate was given intravenously.  Procedure summary:  Appropriate equipment was assembled. The patient was identified as [name] and safety timeout was performed. The patient was placed supine, with head in sniffing position. After adequate level of anesthesia was achieved, a GS#3 blade was inserted into the oropharynx and the vocal cords were visualized. A 7.0 endotracheal tube was inserted with some difficulty ( large tongue, small oropharynx, anterior larynx, morbid obesity ) and visualized going through the vocal cords. The stylette was removed and cuff inflated. Colorimetric change was noted on the CO2 meter. Breath sounds were heard over both lung fields equally. Post procedure chest xray was ordered.  Complications:  No immediate complications were noted.  Hemodynamic parameters and oxygenation remained stable throughout the procedure.    Orlean Bradford, M.D. Pulmonary and Critical Care Medicine [facility] Cell: [phone] Pager: [phone]  11/05/2012, 10:14 PM

## 2012-11-05 NOTE — ED Notes (Signed)
Nasal trumpet placed in pts Right nare.  No complications were noted.  NAD noted.  Pt tolerated procedure well.

## 2012-11-05 NOTE — ED Notes (Signed)
7.0 mm tube placed by Dr Herma Carson using glidscope without issue.  Tube placement confirmed by lung sounds and color change device

## 2012-11-05 NOTE — ED Notes (Signed)
Choked on candy for 5 minutes and since has been unresponsive.  EMS reports the candy was dislodged.  Pt is responding some and following some commands.  Sats decreased to 80%.  Little reaction to OPA placed by EMS.  CBG 190.

## 2012-11-05 NOTE — Progress Notes (Signed)
o2 changes per ABG

## 2012-11-05 NOTE — H&P (Signed)
PULMONARY  / CRITICAL CARE MEDICINE  Name: CAROLY PUREWAL MRN: [id number] DOB: March 20, 1951    ADMISSION DATE:  11/05/2012 CONSULTATION DATE:  11/05/2012  REFERRING MD :  EDP PRIMARY SERVICE:  PCCM  CHIEF COMPLAINT:  Acute encephalopathy  BRIEF PATIENT DESCRIPTION: 62 yo NH resident with COPD, OSA, OHS (on BiPAP / oxygen) and on multiple psychoactive Rx sent to [facility] ED with acute encephalopathy.  Hypercarbic in spite of NIMV.  Intubated.  SIGNIFICANT EVENTS / STUDIES:  5/5  Hypercarbic, failed BiPAP, intubated (dificult intubation - large tongue, small oropharynx, anterior larynx, morbid obesity)  LINES / TUBES: OETT 5/5 >>> OGT 5/5 >>> Foley 5/5 >>>  CULTURES: 5/5  Blood >>>  ANTIBIOTICS: Vancomycin 5/5 >>> Aztreonam 5/5 >>>  The patient is encephalopathic and unable to provide history, which was obtained for available medical records.  HISTORY OF PRESENT ILLNESS:  62 yo NH resident with COPD, OSA, OHS (on BiPAP / oxygen) and on multiple psychoactive Rx sent to [facility] ED with acute encephalopathy.  Hypercarbic in spite of NIMV.  Intubated.  PAST MEDICAL HISTORY :  Past Medical History  Diagnosis Date  . COPD   . Hypertension   . Chronic respiratory failure     3 L oxygen  . GERD (gastroesophageal reflux disease)   . Diabetes mellitus   . Obesity, Class III, BMI 40-49.9 (morbid obesity)   . Obstructive sleep apnea on CPAP   . Peripheral vascular disease   . Bipolar disease, chronic   . Lymphedema   . COPD (chronic obstructive pulmonary disease)   . Cellulitis   . Coronary artery disease   . Asthma   . Myocardial infarction   . Vertigo    Past Surgical History  Procedure Laterality Date  . Abdominal hysterectomy    . Cholecystectomy    . Appendectomy    . Tubal ligation     Prior to Admission medications   Medication Sig Start Date End Date Taking? Authorizing Provider  albuterol (PROVENTIL) (2.5 MG/3ML) 0.083% nebulizer solution Take 2.5 mg by nebulization every  6 (six) hours as needed for wheezing.   Yes Historical Provider, MD  amLODipine (NORVASC) 5 MG tablet Take 5 mg by mouth daily.   Yes Historical Provider, MD  ARIPiprazole (ABILIFY) 10 MG tablet Take 10 mg by mouth daily. For depressive psychosis   Yes Historical Provider, MD  ascorbic acid (VITAMIN C) 500 MG tablet Take 500 mg by mouth 2 (two) times daily.   Yes Historical Provider, MD  aspirin 325 MG tablet Take 325 mg by mouth daily.   Yes Historical Provider, MD  budesonide-formoterol (SYMBICORT) 160-4.5 MCG/ACT inhaler Inhale 2 puffs into the lungs 2 (two) times daily.   Yes Historical Provider, MD  busPIRone (BUSPAR) 10 MG tablet Take 10 mg by mouth 2 (two) times daily.   Yes Historical Provider, MD  calcium-vitamin D (OSCAL WITH D) 500-200 MG-UNIT per tablet Take 1 tablet by mouth 2 (two) times daily.   Yes Historical Provider, MD  clonazePAM (KLONOPIN) 0.5 MG tablet Take 0.5 mg by mouth daily.   Yes Historical Provider, MD  cloNIDine (CATAPRES) 0.2 MG tablet Take 0.2 mg by mouth 2 (two) times daily.   Yes Historical Provider, MD  divalproex (DEPAKOTE ER) 500 MG 24 hr tablet Take 500 mg by mouth 2 (two) times daily.   Yes Historical Provider, MD  docusate sodium (COLACE) 100 MG capsule Take 100 mg by mouth daily.   Yes Historical Provider, MD  DULoxetine (CYMBALTA)  60 MG capsule Take 60 mg by mouth 2 (two) times daily.   Yes Historical Provider, MD  feeding supplement (PRO-STAT SUGAR FREE 64) LIQD Take 30 mLs by mouth 2 (two) times daily.   Yes Historical Provider, MD  fluticasone (FLONASE) 50 MCG/ACT nasal spray Place 2 sprays into the nose daily.   Yes Historical Provider, MD  folic acid (FOLVITE) 1 MG tablet Take 1 mg by mouth daily.   Yes Historical Provider, MD  furosemide (LASIX) 40 MG tablet Take 40 mg by mouth daily.   Yes Historical Provider, MD  gabapentin (NEURONTIN) 400 MG capsule Take 400 mg by mouth 3 (three) times daily.   Yes Historical Provider, MD  isosorbide mononitrate  (IMDUR) 30 MG 24 hr tablet Take 30 mg by mouth daily.   Yes Historical Provider, MD  lisinopril (PRINIVIL,ZESTRIL) 10 MG tablet Take 10 mg by mouth daily.   Yes Historical Provider, MD  metoCLOPramide (REGLAN) 5 MG tablet Take 5 mg by mouth 3 (three) times daily.   Yes Historical Provider, MD  morphine (MS CONTIN) 15 MG 12 hr tablet Take 15 mg by mouth 2 (two) times daily.   Yes Historical Provider, MD  Multiple Vitamins-Minerals (CERTAGEN PO) Take 1 tablet by mouth daily.   Yes Historical Provider, MD  nitroGLYCERIN (NITROSTAT) 0.4 MG SL tablet Place 0.4 mg under the tongue every 5 (five) minutes as needed for chest pain.    Yes Historical Provider, MD  Olopatadine HCl (PATADAY) 0.2 % SOLN Place 1 drop into both eyes daily.    Yes Historical Provider, MD  oxyCODONE-acetaminophen (PERCOCET) 5-325 MG per tablet Take 1 tablet by mouth every 4 (four) hours as needed. For pain   Yes Historical Provider, MD  pantoprazole (PROTONIX) 40 MG tablet Take 40 mg by mouth 2 (two) times daily.   Yes Historical Provider, MD  Propylene Glycol (SYSTANE BALANCE OP) Place 1 drop into both eyes 2 (two) times daily.   Yes Historical Provider, MD  senna (SENOKOT) 8.6 MG TABS Take 2 tablets by mouth daily.   Yes Historical Provider, MD  simethicone (MYLICON) 80 MG chewable tablet Chew 80 mg by mouth every 8 (eight) hours.   Yes Historical Provider, MD  tiotropium (SPIRIVA) 18 MCG inhalation capsule Place 18 mcg into inhaler and inhale daily.   Yes Historical Provider, MD  vitamin D, CHOLECALCIFEROL, 400 UNITS tablet Take 400 Units by mouth daily.   Yes Historical Provider, MD  zolpidem (AMBIEN) 5 MG tablet Take 5 mg by mouth at bedtime as needed. For sleep   Yes Historical Provider, MD  predniSONE (DELTASONE) 50 MG tablet Take 50 mg by mouth daily. Decrease by 10mg  daily until off of med    Historical Provider, MD   Allergies  Allergen Reactions  . Ace Inhibitors Other (See Comments)    unknown  . Aspirin Ec (Aspirin)  Other (See Comments)    Unknown   . Bee Venom Other (See Comments)    Unknown  . Nyquil Cold & (Dm-Doxylamine-Acetaminophen) Other (See Comments)    Unknown   . Penicillins Other (See Comments)    unknown  . Ppd (Tuberculin Purified Protein Derivative) Other (See Comments)    unknown    FAMILY HISTORY:  Family History  Problem Relation Age of Onset  . Prostate cancer     SOCIAL HISTORY:  reports that she has quit smoking. She does not have any smokeless tobacco history on file. She reports that she does not drink alcohol or use  illicit drugs.  REVIEW OF SYSTEMS:  Unable to provide.  INTERVAL HISTORY:  VITAL SIGNS: Temp:  [98 F (36.7 C)] 98 F (36.7 C) (05/05 1830) Pulse Rate:  [92-98] 97 (05/05 2030) Resp:  [10-27] 19 (05/05 2030) BP: (165-216)/(73-117) 195/96 mmHg (05/05 2030) SpO2:  [98 %-100 %] 100 % (05/05 2030) FiO2 (%):  [75 %-80 %] 80 % (05/05 2006) HEMODYNAMICS:   VENTILATOR SETTINGS: Vent Mode:  [-] BIPAP FiO2 (%):  [75 %-80 %] 80 % INTAKE / OUTPUT: Intake/Output   None    PHYSICAL EXAMINATION: General:  Appears acutely ill, mechanically ventilated, synchronous, morbidly obese Neuro:  Encephalopathic, nonfocal, cough / gag diminished HEENT:  PERRL, OETT / OGT Cardiovascular:  RRR, no m/r/g Lungs:  Bilateral diminished air entry, no w/r/r Abdomen:  Soft, nontender, bowel sounds diminished Musculoskeletal:  Moves all extremities, 2+ symmetric pitting edema, chronic stasis dermatitis Skin:  Intact  LABS:  Recent Labs Lab 11/05/12 1755 11/05/12 1800 11/05/12 1831 11/05/12 1845 11/05/12 1846 11/05/12 1949 11/05/12 2050  HGB  --   --   --  11.2*  --   --   --   NA  --   --  139 137  --   --   --   K  --   --  3.6 3.5  --   --   --   CL  --   --  89* 85*  --   --   --   CO2  --   --  43*  --   --   --   --   GLUCOSE  --   --  147* 148*  --   --   --   BUN  --   --  12 12  --   --   --   CREATININE  --   --  0.80 1.00  --   --   --   CALCIUM   --   --  9.6  --   --   --   --   AST  --   --  44*  --   --   --   --   ALT  --   --  19  --   --   --   --   ALKPHOS  --   --  80  --   --   --   --   BILITOT  --   --  0.2*  --   --   --   --   PROT  --   --  8.3  --   --   --   --   ALBUMIN  --   --  3.4*  --   --   --   --   LATICACIDVEN  --   --   --   --  3.93*  --   --   TROPONINI  --  <0.30  --   --   --   --   --   PROBNP  --  332.1*  --   --   --   --   --   PHART 7.362  --   --   --   --  7.274* 7.263*  PCO2ART 77.2*  --   --   --   --  110.8* 117.5*  PO2ART 172.0*  --   --   --   --  100.0 159.0*   No results found for this  basename: GLUCAP,  in the last 168 hours  CXR:  5/5 >>>  Small volumes  ASSESSMENT / PLAN:  PULMONARY A: Acute on chronic respiratory failure.  Acute pulmonary edema.  Less likely pneumonia (HCAP).  Rx induced hypoventilation.  OSA/OHS.  COPD without exacerbation. P:   Gaol SpO2>92, pH>7.30 Full mechanical support Daily SBT Trend ABG / CXR Albuterol / Atrovent Hold Flonase, Synbicort, Spiriva  CARDIOVASCULAR A: CAD.  Hypertension. Hypertensive emergency. P:  Goal SBP< 160 and DBP<100 Trend troponin / lactate Nitroglycerin gtt Continue ASA, Norvasc, Clonidine, Lisinopril Hold Imdur extended release  RENAL A:  Fluid overload.  Hypokalemia. P:   Trend BMP Replete K PRN (ordered 10 x 4) Increase Lasix 40 IV q12h  GASTROINTESTINAL A:  Morbid obesity.  GERD. P:   NPO as intubated TF if remains intubated > 24 hours Protonix for GI Px  HEMATOLOGIC A:  Mild anemia. P:  Trend CBC Heparin for DVT Px  INFECTIOUS A:  Suspected HCAP.  PCN allergy. P:   Cultures and antibiotics as above PCT  ENDOCRINE  A:  Hyperglycemia. P:   SSI  NEUROLOGIC A:  Acute encephalopathy. P:   Goal RASS 0 to -1 Hold Abilify, Buspar, Klonopin, Depakote, Cymbalta, Gabapentin, Oxycodone, Ambien (the need for this needs to be addressed prior to discharge) Propofol gtt Head CT Drug screen  TODAY'S  SUMMARY: 62 yo NH resident with COPD, OSA, OHS (on BiPAP / oxygen) and on multiple psychoactive Rx sent to [facility] ED with acute encephalopathy.  Hypercarbic in spite of NIMV.  Intubated.  Hypertensive emergency.  Acute pulmonary edema.  Less likely HCAP.  Tonight:  Mechanical support, hold sedatives except Propofol, start abx (should be able to d/c fast if cxr improves), Lasix.  eLink - follow cxr / head CT.  I have personally obtained a history, examined the patient, evaluated laboratory and imaging results, formulated the assessment and plan and placed orders.  CRITICAL CARE:  The patient is critically ill with multiple organ systems failure and requires high complexity decision making for assessment and support, frequent evaluation and titration of therapies, application of advanced monitoring technologies and extensive interpretation of multiple databases. Critical Care Time devoted to patient care services described in this note is 45 minutes.   Lonia Farber, MD Pulmonary and Critical Care Medicine [facility] Pager: [phone]  11/05/2012, 9:23 PM

## 2012-11-05 NOTE — ED Notes (Signed)
20 mg etomidate pushed by Dr Herma Carson for intubation sedation

## 2012-11-06 ENCOUNTER — Inpatient Hospital Stay (HOSPITAL_COMMUNITY): Payer: Medicaid Other

## 2012-11-06 DIAGNOSIS — I369 Nonrheumatic tricuspid valve disorder, unspecified: Secondary | ICD-10-CM

## 2012-11-06 DIAGNOSIS — I509 Heart failure, unspecified: Secondary | ICD-10-CM

## 2012-11-06 DIAGNOSIS — G9340 Encephalopathy, unspecified: Principal | ICD-10-CM

## 2012-11-06 LAB — URINALYSIS, ROUTINE W REFLEX MICROSCOPIC
Bilirubin Urine: NEGATIVE
Glucose, UA: NEGATIVE mg/dL
Ketones, ur: NEGATIVE mg/dL
Nitrite: NEGATIVE
Protein, ur: 30 mg/dL — AB
pH: 8.5 — ABNORMAL HIGH (ref 5.0–8.0)

## 2012-11-06 LAB — CBC
MCH: 25.9 pg — ABNORMAL LOW (ref 26.0–34.0)
MCHC: 31.8 g/dL (ref 30.0–36.0)
MCV: 81.3 fL (ref 78.0–100.0)
Platelets: 205 10*3/uL (ref 150–400)
RDW: 14.7 % (ref 11.5–15.5)

## 2012-11-06 LAB — RAPID URINE DRUG SCREEN, HOSP PERFORMED
Amphetamines: NOT DETECTED
Barbiturates: NOT DETECTED
Tetrahydrocannabinol: NOT DETECTED

## 2012-11-06 LAB — LACTIC ACID, PLASMA
Lactic Acid, Venous: 1.5 mmol/L (ref 0.5–2.2)
Lactic Acid, Venous: 2.6 mmol/L — ABNORMAL HIGH (ref 0.5–2.2)

## 2012-11-06 LAB — BASIC METABOLIC PANEL
CO2: >45 mEq/L (ref 19–32)
Calcium: 9.4 mg/dL (ref 8.4–10.5)
Creatinine, Ser: 0.79 mg/dL (ref 0.50–1.10)
GFR calc Af Amer: >90 mL/min (ref >90)
GFR calc non Af Amer: 87 mL/min — ABNORMAL LOW (ref >90)

## 2012-11-06 LAB — PHOSPHORUS: Phosphorus: 2.7 mg/dL (ref 2.3–4.6)

## 2012-11-06 LAB — GLUCOSE, CAPILLARY
Glucose-Capillary: 85 mg/dL (ref 70–99)
Glucose-Capillary: 87 mg/dL (ref 70–99)

## 2012-11-06 LAB — URINE MICROSCOPIC-ADD ON

## 2012-11-06 LAB — PROCALCITONIN
Procalcitonin: <0.1 ng/mL
Procalcitonin: 0.12 ng/mL

## 2012-11-06 MED ORDER — MAGNESIUM SULFATE 40 MG/ML IJ SOLN
2.0000 g | Freq: Once | INTRAMUSCULAR | Status: AC
  Administered 2012-11-06: 2 g via INTRAVENOUS
  Filled 2012-11-06: qty 50

## 2012-11-06 MED ORDER — CLONAZEPAM 0.5 MG PO TABS
0.5000 mg | ORAL_TABLET | Freq: Every day | ORAL | Status: DC
  Administered 2012-11-06 – 2012-11-09 (×4): 0.5 mg via ORAL
  Filled 2012-11-06 (×4): qty 1

## 2012-11-06 MED ORDER — PROPOFOL 10 MG/ML IV EMUL
5.0000 ug/kg/min | INTRAVENOUS | Status: DC
  Administered 2012-11-06: 60 ug/kg/min via INTRAVENOUS
  Administered 2012-11-06: 30 ug/kg/min via INTRAVENOUS
  Administered 2012-11-06 (×4): 60 ug/kg/min via INTRAVENOUS
  Administered 2012-11-06 – 2012-11-07 (×2): 30 ug/kg/min via INTRAVENOUS
  Administered 2012-11-07: 50 ug/kg/min via INTRAVENOUS
  Administered 2012-11-07: 20 ug/kg/min via INTRAVENOUS
  Administered 2012-11-07: 30 ug/kg/min via INTRAVENOUS
  Administered 2012-11-07: 40 ug/kg/min via INTRAVENOUS
  Administered 2012-11-07: 35 ug/kg/min via INTRAVENOUS
  Administered 2012-11-07 – 2012-11-08 (×2): 30 ug/kg/min via INTRAVENOUS
  Administered 2012-11-08: 15 ug/kg/min via INTRAVENOUS
  Filled 2012-11-06 (×16): qty 100

## 2012-11-06 MED ORDER — FENTANYL CITRATE 0.05 MG/ML IJ SOLN
50.0000 ug | INTRAMUSCULAR | Status: DC
  Administered 2012-11-06 – 2012-11-09 (×19): 50 ug via INTRAVENOUS
  Filled 2012-11-06 (×20): qty 2

## 2012-11-06 MED ORDER — MIDAZOLAM HCL 2 MG/2ML IJ SOLN: 2.0000 mg | Freq: Once | INTRAMUSCULAR | Status: AC

## 2012-11-06 MED ORDER — OSMOLITE 1.2 CAL PO LIQD
1000.0000 mL | ORAL | Status: DC
  Administered 2012-11-06 – 2012-11-07 (×2): 1000 mL
  Filled 2012-11-06 (×4): qty 1000

## 2012-11-06 MED ORDER — DEXTROSE 5 % IV SOLN
2.0000 g | Freq: Three times a day (TID) | INTRAVENOUS | Status: DC
  Administered 2012-11-06 – 2012-11-09 (×10): 2 g via INTRAVENOUS
  Filled 2012-11-06 (×12): qty 2

## 2012-11-06 MED ORDER — VECURONIUM BROMIDE 10 MG IV SOLR
10.0000 mg | Freq: Once | INTRAVENOUS | Status: AC
  Filled 2012-11-06: qty 10

## 2012-11-06 MED ORDER — MAGNESIUM SULFATE 50 % IJ SOLN: 2.0000 g | Freq: Once | INTRAVENOUS | Status: DC

## 2012-11-06 MED ORDER — VECURONIUM BROMIDE 10 MG IV SOLR
INTRAVENOUS | Status: AC
  Administered 2012-11-06: 10 mg via INTRAVENOUS
  Filled 2012-11-06: qty 10

## 2012-11-06 MED ORDER — OSMOLITE 1.2 CAL PO LIQD
1000.0000 mL | ORAL | Status: DC
  Filled 2012-11-06 (×2): qty 1000

## 2012-11-06 MED ORDER — LORAZEPAM 2 MG/ML IJ SOLN
INTRAMUSCULAR | Status: AC
  Administered 2012-11-06: 2 mg via INTRAVENOUS
  Filled 2012-11-06: qty 1

## 2012-11-06 MED ORDER — SODIUM CHLORIDE 0.9 % IV SOLN
INTRAVENOUS | Status: DC
  Administered 2012-11-06: 18:00:00 via INTRAVENOUS

## 2012-11-06 MED ORDER — MIDAZOLAM HCL 2 MG/2ML IJ SOLN
INTRAMUSCULAR | Status: AC
  Administered 2012-11-06: 2 mg via INTRAVENOUS
  Filled 2012-11-06: qty 4

## 2012-11-06 MED ORDER — VALPROIC ACID 250 MG/5ML PO SYRP
500.0000 mg | ORAL_SOLUTION | Freq: Two times a day (BID) | ORAL | Status: DC
  Administered 2012-11-06 – 2012-11-12 (×13): 500 mg via ORAL
  Filled 2012-11-06 (×15): qty 10

## 2012-11-06 MED ORDER — PRO-STAT SUGAR FREE PO LIQD
60.0000 mL | Freq: Three times a day (TID) | ORAL | Status: DC
  Administered 2012-11-06 – 2012-11-08 (×6): 60 mL
  Filled 2012-11-06 (×8): qty 60

## 2012-11-06 MED ORDER — VANCOMYCIN HCL 10 G IV SOLR
1500.0000 mg | Freq: Two times a day (BID) | INTRAVENOUS | Status: DC
  Administered 2012-11-06 – 2012-11-07 (×3): 1500 mg via INTRAVENOUS
  Filled 2012-11-06 (×5): qty 1500

## 2012-11-06 MED ORDER — FENTANYL CITRATE 0.05 MG/ML IJ SOLN
100.0000 ug | Freq: Once | INTRAMUSCULAR | Status: AC
  Administered 2012-11-06: 100 ug via INTRAVENOUS

## 2012-11-06 MED ORDER — ETOMIDATE 2 MG/ML IV SOLN: 20.0000 mg | Freq: Once | INTRAVENOUS | Status: AC

## 2012-11-06 MED ORDER — LORAZEPAM 2 MG/ML IJ SOLN: 2.0000 mg | Freq: Once | INTRAMUSCULAR | Status: AC

## 2012-11-06 MED ORDER — ETOMIDATE 2 MG/ML IV SOLN
INTRAVENOUS | Status: AC
  Administered 2012-11-06: 20 mg via INTRAVENOUS
  Filled 2012-11-06: qty 10

## 2012-11-06 NOTE — Consult Note (Addendum)
WOC consult Note Reason for Consult: Consult requested for left leg wound.  Pt unable to speak but has a silver dressing in place with ace wrap. Wound type: Full thickness stasis ulcer to left posterior calf. Measurement: 2X1X.1cm Wound bed: 100% red Drainage (amount, consistency, odor) mod yellow drainage, no odor Periwound: intact skin surrounding Dressing procedure/placement/frequency: Continue present plan of care with Aquacel and acewrap for light compression. Please re-consult if further assistance is needed.  Thank-you,  Cammie Mcgee MSN, RN, CWOCN, [place], CNS [phone]

## 2012-11-06 NOTE — Progress Notes (Signed)
INITIAL NUTRITION ASSESSMENT  DOCUMENTATION CODES Per approved criteria  -Morbid Obesity   INTERVENTION:  Initiate TF via OGT with Osmolite 1.2 at 10 ml/h with Prostat 60 ml TID to provide 888 kcals, 103 gm protein, 197 ml free water daily.  Hold TF at 10 ml/h for now to prevent overfeeding with current Propofol rate.  TF plus Propofol will provide a total of 1577 kcals (74% of estimated needs) and 103 gm protein (82% of estimated needs)  Unable to meet nutrition goal at this time due to Propofol usage.  NUTRITION DIAGNOSIS: Inadequate oral intake related to inability to eat as evidenced by NPO status.   Goal: Enteral nutrition to provide 60-70% of estimated calorie needs (22-25 kcals/kg ideal body weight) and 100% of estimated protein needs, based on ASPEN guidelines for permissive underfeeding in critically ill obese individuals.  Monitor:  TF tolerance/adequacy, weight trend, labs, vent status.  Reason for Assessment: MD Consult for TF initiation and management.  62 y.o. female  Admitting Dx: Acute encephalopathy  ASSESSMENT: Patient was admitted from SNF on 5/5 with acute encephalopathy, required intubation on admission.  Patient remains intubated on ventilator support.  MV: 10.6 Temp:Temp (24hrs), Avg:98.3 F (36.8 C), Min:98 F (36.7 C), Max:98.6 F (37 C)  Propofol at 26.1 ml/h providing 689 kcals/day.  Height: Ht Readings from Last 1 Encounters:  11/05/12 5\' 2"  (1.575 m)    Weight: Wt Readings from Last 1 Encounters:  11/06/12 316 lb 2.2 oz (143.4 kg)    Ideal Body Weight: 50 kg  % Ideal Body Weight: 287%  Wt Readings from Last 10 Encounters:  11/06/12 316 lb 2.2 oz (143.4 kg)  10/18/12 319 lb (144.697 kg)  09/04/11 283 lb 12.8 oz (128.731 kg)  03/23/09 280 lb (127.007 kg)  09/17/07 310 lb 9.6 oz (140.887 kg)    Usual Body Weight: 283-319 lb  % Usual Body Weight: 100%  BMI:  Body mass index is 57.81 kg/(m^2). class 3, extreme  obesity  Estimated Nutritional Needs: Kcal: 2120 (1610-9604) Protein: 125 gm Fluid: 2.1-2.2 L  Skin: chronic stasis dermatitis  Diet Order: NPO  EDUCATION NEEDS: -Education not appropriate at this time   Intake/Output Summary (Last 24 hours) at 11/06/12 1221 Last data filed at 11/06/12 1019  Gross per 24 hour  Intake 1260.76 ml  Output   3500 ml  Net -2239.24 ml    Last BM: none documented   Labs:   Recent Labs Lab 11/05/12 1831 11/05/12 1845 11/06/12 0550  NA 139 137 138  K 3.6 3.5 4.0  CL 89* 85* 86*  CO2 43*  --  >45*  BUN 12 12 13   CREATININE 0.80 1.00 0.79  CALCIUM 9.6  --  9.4  MG  --   --  1.5  PHOS  --   --  2.7  GLUCOSE 147* 148* 96    CBG (last 3)   Recent Labs  11/05/12 2337 11/06/12 0358  GLUCAP 108* 107*    Scheduled Meds: . albuterol  6 puff Inhalation Q4H  . amLODipine  5 mg Oral Daily  . antiseptic oral rinse  1 application Mouth Rinse QID  . aspirin  325 mg Oral Daily  . aztreonam  2 g Intravenous Q8H  . chlorhexidine  15 mL Mouth/Throat BID  . clonazePAM  0.5 mg Oral Daily  . cloNIDine  0.2 mg Oral BID  . fentaNYL  50 mcg Intravenous Q4H  . furosemide  40 mg Intravenous Q12H  . heparin subcutaneous  5,000 Units Subcutaneous Q8H  . insulin aspart  0-15 Units Subcutaneous Q4H  . ipratropium  6 puff Inhalation Q4H  . lisinopril  10 mg Oral Daily  . pantoprazole (PROTONIX) IV  40 mg Intravenous Q24H  . Valproic Acid  500 mg Oral BID  . vancomycin  1,500 mg Intravenous Q12H    Continuous Infusions: . feeding supplement (OSMOLITE 1.2 CAL)    . nitroGLYCERIN Stopped (11/05/12 2133)  . propofol 20 mcg/kg/min (11/06/12 1000)    Past Medical History  Diagnosis Date  . COPD   . Hypertension   . Chronic respiratory failure     3 L oxygen  . GERD (gastroesophageal reflux disease)   . Diabetes mellitus   . Obesity, Class III, BMI 40-49.9 (morbid obesity)   . Obstructive sleep apnea on CPAP   . Peripheral vascular disease    . Bipolar disease, chronic   . Lymphedema   . COPD (chronic obstructive pulmonary disease)   . Cellulitis   . Coronary artery disease   . Asthma   . Myocardial infarction   . Vertigo     Past Surgical History  Procedure Laterality Date  . Abdominal hysterectomy    . Cholecystectomy    . Appendectomy    . Tubal ligation      Joaquin Courts, RD, LDN, CNSC Pager [phone] After Hours Pager [phone]

## 2012-11-06 NOTE — Progress Notes (Signed)
Clinical Social Work Department BRIEF PSYCHOSOCIAL ASSESSMENT 11/06/2012  Patient:  SAVAHANNA, ALMENDARIZ     Account Number:  [id number]     Admit date:  11/05/2012  Clinical Social Worker:  Margaree Mackintosh  Date/Time:  11/06/2012 01:35 PM  Referred by:  CSW  Date Referred:  11/06/2012 Referred for  SNF Placement   Other Referral:   Pt from [facility] SN[facility].   Interview type:  Family Other interview type:   Pt currently intubated and sedated.    PSYCHOSOCIAL DATA Living Status:  FACILITY Admitted from facility:  [facility] Level of care:  Skilled Nursing Facility Primary support name:  [place]: [phone] Primary support relationship to patient:  CHILD, ADULT Degree of support available:   Adequate.    CURRENT CONCERNS Current Concerns  Post-Acute Placement   Other Concerns:    SOCIAL WORK ASSESSMENT / PLAN Clinical Social Worker reviewed chart and noted pt is from [facility].  CSW phoned pt's dtr, Joann.  CSW introduced self, explained role, and provided support.  CSW provided active listening; dtr agreeable to information being sent to [facility].  Dtr on her way from work to SNF to speak with SNF regarding disposition plans.  CSW to continue to follow and assist as needed.   Assessment/plan status:  Information/Referral to [facility] Other assessment/ plan:   Information/referral to community resources:   SNF.    PATIENT'S/FAMILY'S RESPONSE TO PLAN OF CARE: Dtr thanked CSW for intervention.

## 2012-11-06 NOTE — Care Management Note (Signed)
    Page 1 of 1   11/06/2012     8:49:13 AM   CARE MANAGEMENT NOTE 11/06/2012  Patient:  [name], [name]   Account Number:  [id number]  Date Initiated:  11/06/2012  Documentation initiated by:  Junius Creamer  Subjective/Objective Assessment:   adm w resp failure, vent     Action/Plan:   from nsg facility, sw ref   Anticipated DC Date:     Anticipated DC Plan:  SKILLED NURSING FACILITY  In-house referral  Clinical Social Worker      DC Planning Services  CM consult      Choice offered to / List presented to:             Status of service:   Medicare Important Message given?   (If response is "NO", the following Medicare IM given date fields will be blank) Date Medicare IM given:   Date Additional Medicare IM given:    Discharge Disposition:    Per UR Regulation:  Reviewed for med. necessity/level of care/duration of stay  If discussed at Long Length of Stay Meetings, dates discussed:    Comments:

## 2012-11-06 NOTE — Progress Notes (Signed)
Pt reintubated at this time by MD after self extubation. No complications noted. RT will monitor.

## 2012-11-06 NOTE — Progress Notes (Addendum)
PULMONARY  / CRITICAL CARE MEDICINE  Name: [name] MRN: [id number] DOB: August 12, 1950    ADMISSION DATE:  11/05/2012 CONSULTATION DATE:  11/05/2012  REFERRING MD :  EDP PRIMARY SERVICE:  PCCM  CHIEF COMPLAINT:  Acute encephalopathy  BRIEF PATIENT DESCRIPTION: 62 yo NH resident with COPD, OSA, OHS (on BiPAP / oxygen) DM, HTN, and on multiple psychoactive Rx sent to [facility] ED with acute encephalopathy.  Hypercarbic in spite of NIMV.  Intubated.  SIGNIFICANT EVENTS / STUDIES:  5/5  Hypercarbic, failed BiPAP, intubated (dificult intubation - large tongue, small oropharynx, anterior larynx, morbid obesity) 5/5 head CT>>>No acute intracranial abnormality. Stable appearance of chronic small vessel disease.  LINES / TUBES: OETT 5/5 >>> OGT 5/5 >>> Foley 5/5 >>>  CULTURES: 5/5  Blood >>>  ANTIBIOTICS: Vancomycin 5/5 >>> Aztreonam 5/5 >>>  SUBJECTIVE: Per RT tried SBT but pt was still on propofol so it was not successful. May retry with propofol off.   VITAL SIGNS: Temp:  [98 F (36.7 C)-98.4 F (36.9 C)] 98.4 F (36.9 C) (05/06 0400) Pulse Rate:  [70-104] 70 (05/06 0600) Resp:  [10-27] 24 (05/06 0600) BP: (116-255)/(58-117) 116/58 mmHg (05/06 0600) SpO2:  [96 %-100 %] 99 % (05/06 0600) FiO2 (%):  [39.9 %-80 %] 40 % (05/06 0600) Weight:  [316 lb 2.2 oz (143.4 kg)-328 lb (148.78 kg)] 316 lb 2.2 oz (143.4 kg) (05/06 0500) HEMODYNAMICS:   VENTILATOR SETTINGS: Vent Mode:  [-] PRVC FiO2 (%):  [39.9 %-80 %] 40 % Set Rate:  [24 bmp] 24 bmp Vt Set:  [420 mL] 420 mL PEEP:  [5 cmH20] 5 cmH20 Plateau Pressure:  [27 cmH20-42 cmH20] 32 cmH20 INTAKE / OUTPUT: Intake/Output     05/05 0701 - 05/06 0700 05/06 0701 - 05/07 0700   I.V. (mL/kg) 364 (2.5)    IV Piggyback 650    Total Intake(mL/kg) 1014 (7.1)    Urine (mL/kg/hr) 3000    Total Output 3000     Net -1986           PHYSICAL EXAMINATION: General:  Appears acutely ill, mechanically ventilated, synchronous, morbidly  obese Neuro:  Sedated, nonfocal HEENT:  PERRL, OETT / OGT Cardiovascular:  RRR, no m/r/g Lungs:  Bilateral diminished air entry, rhonchi. No rales or wheezing Abdomen:  Soft, nontender, bowel sounds diminished Musculoskeletal:  Moves all extremities, 2+ symmetric pitting edema, chronic stasis dermatitis Skin:  Intact  LABS:  Recent Labs Lab 11/05/12 1701  11/05/12 1800  11/05/12 1831 11/05/12 1845 11/05/12 1846 11/05/12 1949 11/05/12 2050 11/05/12 2151 11/05/12 2230 11/06/12 0335 11/06/12 0340 11/06/12 0550  HGB  --   --   --   --   --  11.2*  --   --   --   --   --   --   --  9.0*  WBC  --   --   --   --   --   --   --   --   --   --   --   --   --  5.8  PLT  --   --   --   --   --   --   --   --   --   --   --   --   --  205  NA  --   --   --   --  139 137  --   --   --   --   --   --   --  138  K  --   --   --   < > 3.6 3.5  --   --   --   --   --   --   --  4.0  CL  --   --   --   --  89* 85*  --   --   --   --   --   --   --  86*  CO2  --   --   --   --  43*  --   --   --   --   --   --   --   --  >45*  GLUCOSE  --   --   --   --  147* 148*  --   --   --   --   --   --   --  96  BUN  --   --   --   --  12 12  --   --   --   --   --   --   --  13  CREATININE  --   --   --   --  0.80 1.00  --   --   --   --   --   --   --  0.79  CALCIUM  --   --   --   --  9.6  --   --   --   --   --   --   --   --  9.4  MG  --   --   --   --   --   --   --   --   --   --   --   --   --  1.5  PHOS  --   --   --   --   --   --   --   --   --   --   --   --   --  2.7  AST  --   --   --   --  44*  --   --   --   --   --   --   --   --   --   ALT  --   --   --   --  19  --   --   --   --   --   --   --   --   --   ALKPHOS  --   --   --   --  80  --   --   --   --   --   --   --   --   --   BILITOT  --   --   --   --  0.2*  --   --   --   --   --   --   --   --   --   PROT  --   --   --   --  8.3  --   --   --   --   --   --   --   --   --   ALBUMIN  --   --   --   --  3.4*  --   --   --   --    --   --   --   --   --  LATICACIDVEN  --   --   --   --   --   --  3.93*  --   --  3.1*  --   --  1.5  --   TROPONINI  --   --  <0.30  --   --   --   --   --   --  <0.30  --  <0.30  --   --   PROCALCITON <0.10  --   --   --   --   --   --   --   --   --   --   --   --  0.12  PROBNP  --   --  332.1*  --   --   --   --   --   --   --   --   --   --   --   PHART  --   < >  --   --   --   --   --  7.274* 7.263*  --  7.368  --   --   --   PCO2ART  --   < >  --   --   --   --   --  110.8* 117.5*  --  89.7*  --   --   --   PO2ART  --   < >  --   --   --   --   --  100.0 159.0*  --  141.0*  --   --   --   < > = values in this interval not displayed.  Recent Labs Lab 11/05/12 2337 11/06/12 0358  GLUCAP 108* 107*    CXR:  5/6>>>Stable lines and tubes. Stable ventilation with bilateral pleural effusions and lung base atelectasis or consolidation  ASSESSMENT / PLAN:  PULMONARY A: Acute on chronic respiratory failure.   Acute pulmonary edema- Less likely pneumonia (HCAP) - secondary to htn crisis Rx induced hypoventilation OSA/OHS COPD without exacerbation P:   abg reviewed, maintain current MV Ensure 8 cc/kg Low O2 needs, sbt cpap 5 ps 5 , goal 2 hrs Neg balance goals Albuterol / Atrovent Hold Flonase, Synbicort, Spiriva Control afterload  CARDIOVASCULAR A: CAD Hypertension/ Hypertensive emergency P:  Goal MAP 25% reduction from highest MAP on admission Nitroglycerin gtt now off  Continue ASA, Norvasc, Clonidine, Lisinopril Echo reviewed from 2010, repeat eval worsening diastolic heart failure as cause  RENAL A:  Fluid overload- negative 1.9 L past 24 hrs  Hypokalemia-improved No evidence ischemia P:   Trend BMP  Lasix 40 IV q12h, maintain to same neg balance  GASTROINTESTINAL A:  Morbid obesity  GERD P:   Consider starting TF  Protonix for GI Px  HEMATOLOGIC A:  Mild anemia. P:  Trend CBC Heparin for DVT Px PRBCs for hgb<7  INFECTIOUS A:  Suspected HCAP P:    BC pending On vanc and aztreonam Monitor fever curve and wbc  ENDOCRINE  A:  Hyperglycemia-improved DM P:   SSI  NEUROLOGIC A:  Acute encephalopathy. P:   Hold Abilify, Buspar, Klonopin, Depakote, Cymbalta, Gabapentin, Oxycodone, Ambien (the need for this needs to be addressed prior to discharge) Propofol gtt wua Consider restart klonipin to avoid WD Assess depakote level fent to avoid wd from oxycontin Consider restart of mood stabalizer  TODAY'S SUMMARY: Pt was negative 1.9 L yesterday, will continue Lasix. Continue abx. Monitor abg.   Gery Pray, PA-S  I have personally obtained  a history, examined the patient, evaluated laboratory and imaging results, formulated the assessment and plan and placed orders.  CRITICAL CARE:  The patient is critically ill with multiple organ systems failure and requires high complexity decision making for assessment and support, frequent evaluation and titration of therapies, application of advanced monitoring technologies and extensive interpretation of multiple databases. Critical Care Time devoted to patient care services described in this note is 35 minutes.   [name]. [name] Alias, MD, FACP Pgr: [phone] [facility]

## 2012-11-06 NOTE — Progress Notes (Signed)
Distended abdomen with large amount of air in the stomach post bag mask ventilation post reintubation.   Plan: Hold the tube feedings for 2 hours. OG to intermittent suction every 15 min while tube feedings on hold.

## 2012-11-06 NOTE — Progress Notes (Signed)
Portable EEG completed

## 2012-11-06 NOTE — Progress Notes (Signed)
Echocardiogram 2D Echocardiogram has been performed.  [name] 11/06/2012, 12:53 PM

## 2012-11-06 NOTE — ED Provider Notes (Signed)
I have supervised the resident on the management of this patient and agree with the note above. I personally interviewed and examined the patient and my addendum is below.   [name] is a 62 y.o. female hx of CHF, COPD, obesity hypoventilation syndrome here presenting after a choking episode at nursing home. Choked on a candy that was subsequently removed. Then became less responsive and was hypoxic to 80% as per EMS. On arrival, patient appeared obese and is following commands. She is tired but is arousable. ABG on arrival showed CO2 77 but that was similar to her baseline. Her CXR showed heart failure. She was started on nitro drip and given lasix IV. I think the choking episode caused her to be in pulmonary edema. However, repeat ABG an hour later showed worsening hypercapnea around 110. Patient placed on bipap and ICU was consulted. ICU arrived and patient's condition continue to worsen and was intubated by the intensivist. Patient admitted to ICU.     Nasopharyngoscopy Procedure I performed nasopharyngoscopy to assess her airway and look for foreign bodies. No foreign body visualized. The vocal cords are mobile and not paralyzed. No complications.   CRITICAL CARE Performed by: Silverio Lay, DAVID   Total critical care time: 45 min   Critical care time was exclusive of separately billable procedures and treating other patients.  Critical care was necessary to treat or prevent imminent or life-threatening deterioration.  Critical care was time spent personally by me on the following activities: development of treatment plan with patient and/or surrogate as well as nursing, discussions with consultants, evaluation of patient's response to treatment, examination of patient, obtaining history from patient or surrogate, ordering and performing treatments and interventions, ordering and review of laboratory studies, ordering and review of radiographic studies, pulse oximetry and re-evaluation of  patient's condition.   Richardean Canal, MD 11/06/12 2041

## 2012-11-06 NOTE — Procedures (Signed)
Intubation Procedure Note [name] [id number] [birth year]  Procedure: Intubation Indications: Respiratory insufficiency  Procedure Details Consent: Unable to obtain consent because of emergent medical necessity. Time Out: Verified patient identification, verified procedure, site/side was marked, verified correct patient position, special equipment/implants available, medications/allergies/relevent history reviewed, required imaging and test results available.  Performed  MAC 4 - GlideScope Utilized.    Evaluation Hemodynamic Status: BP stable throughout; O2 sats: transiently fell during during procedure Patient's Current Condition: stable Complications: No apparent complications Patient did tolerate procedure well. Chest X-ray ordered to verify placement.  CXR: pending.   Canary Brim, NP-C [facility] Pgr: [phone] or [phone]   11/06/2012   Patient seen and examined, agree with above note.  I dictated the care and orders written for this patient under my direction.  Alyson Reedy, MD [phone]

## 2012-11-07 ENCOUNTER — Inpatient Hospital Stay (HOSPITAL_COMMUNITY): Payer: Medicaid Other

## 2012-11-07 ENCOUNTER — Encounter (HOSPITAL_COMMUNITY): Payer: Self-pay | Admitting: *Deleted

## 2012-11-07 DIAGNOSIS — L0390 Cellulitis, unspecified: Secondary | ICD-10-CM

## 2012-11-07 DIAGNOSIS — J449 Chronic obstructive pulmonary disease, unspecified: Secondary | ICD-10-CM

## 2012-11-07 DIAGNOSIS — L0291 Cutaneous abscess, unspecified: Secondary | ICD-10-CM

## 2012-11-07 DIAGNOSIS — F319 Bipolar disorder, unspecified: Secondary | ICD-10-CM

## 2012-11-07 LAB — GLUCOSE, CAPILLARY
Glucose-Capillary: 90 mg/dL (ref 70–99)
Glucose-Capillary: 106 mg/dL — ABNORMAL HIGH (ref 70–99)
Glucose-Capillary: 100 mg/dL — ABNORMAL HIGH (ref 70–99)

## 2012-11-07 LAB — CBC WITH DIFFERENTIAL/PLATELET
Basophils Relative: 0 % (ref 0–1)
Eosinophils Absolute: 0.1 10*3/uL (ref 0.0–0.7)
HCT: 27.4 % — ABNORMAL LOW (ref 36.0–46.0)
Hemoglobin: 9 g/dL — ABNORMAL LOW (ref 12.0–15.0)
Lymphs Abs: 1.7 10*3/uL (ref 0.7–4.0)
MCH: 26.2 pg (ref 26.0–34.0)
MCHC: 32.8 g/dL (ref 30.0–36.0)
Monocytes Absolute: 0.5 10*3/uL (ref 0.1–1.0)
Monocytes Relative: 6 % (ref 3–12)
Neutro Abs: 5.6 10*3/uL (ref 1.7–7.7)
Neutrophils Relative %: 71 % (ref 43–77)
RBC: 3.44 MIL/uL — ABNORMAL LOW (ref 3.87–5.11)

## 2012-11-07 LAB — URINE CULTURE: Colony Count: NO GROWTH

## 2012-11-07 LAB — COMPREHENSIVE METABOLIC PANEL
ALT: 12 U/L (ref 0–35)
AST: 35 U/L (ref 0–37)
Albumin: 2.7 g/dL — ABNORMAL LOW (ref 3.5–5.2)
CO2: 43 mEq/L (ref 19–32)
Calcium: 8.7 mg/dL (ref 8.4–10.5)
Creatinine, Ser: 1.27 mg/dL — ABNORMAL HIGH (ref 0.50–1.10)
GFR calc non Af Amer: 44 mL/min — ABNORMAL LOW (ref >90)
Sodium: 135 mEq/L (ref 135–145)
Total Protein: 6.7 g/dL (ref 6.0–8.3)

## 2012-11-07 LAB — PROCALCITONIN: Procalcitonin: 0.22 ng/mL

## 2012-11-07 MED ORDER — ARIPIPRAZOLE 10 MG PO TABS
10.0000 mg | ORAL_TABLET | Freq: Every day | ORAL | Status: DC
  Administered 2012-11-07 – 2012-11-12 (×6): 10 mg via ORAL
  Filled 2012-11-07 (×6): qty 1

## 2012-11-07 MED ORDER — GABAPENTIN 400 MG PO CAPS
400.0000 mg | ORAL_CAPSULE | Freq: Three times a day (TID) | ORAL | Status: DC
  Administered 2012-11-07 – 2012-11-12 (×15): 400 mg via ORAL
  Filled 2012-11-07 (×18): qty 1

## 2012-11-07 MED ORDER — BIOTENE DRY MOUTH MT LIQD
15.0000 mL | Freq: Four times a day (QID) | OROMUCOSAL | Status: DC
  Administered 2012-11-07 – 2012-11-09 (×7): 15 mL via OROMUCOSAL

## 2012-11-07 MED ORDER — POTASSIUM CHLORIDE 20 MEQ/15ML (10%) PO LIQD
40.0000 meq | Freq: Once | ORAL | Status: AC
  Administered 2012-11-07: 40 meq
  Filled 2012-11-07: qty 30

## 2012-11-07 MED ORDER — CHLORHEXIDINE GLUCONATE 0.12 % MT SOLN
15.0000 mL | Freq: Two times a day (BID) | OROMUCOSAL | Status: DC
  Administered 2012-11-07 – 2012-11-08 (×3): 15 mL via OROMUCOSAL
  Filled 2012-11-07 (×3): qty 15

## 2012-11-07 MED ORDER — DOCUSATE SODIUM 50 MG/5ML PO LIQD
100.0000 mg | Freq: Every day | ORAL | Status: DC
  Administered 2012-11-07 – 2012-11-12 (×4): 100 mg via ORAL
  Filled 2012-11-07 (×6): qty 10

## 2012-11-07 NOTE — Procedures (Signed)
Central Venous Catheter Insertion Procedure Note [name] [id number] 1951-01-22  Procedure: Insertion of Central Venous Catheter Indications: Assessment of intravascular volume, Drug and/or fluid administration and Frequent blood sampling  Procedure Details Consent: Risks of procedure as well as the alternatives and risks of each were explained to the (patient/caregiver).  Consent for procedure obtained. Time Out: Verified patient identification, verified procedure, site/side was marked, verified correct patient position, special equipment/implants available, medications/allergies/relevent history reviewed, required imaging and test results available.  Performed  Maximum sterile technique was used including antiseptics, cap, gloves, gown, hand hygiene, mask and sheet. Skin prep: Chlorhexidine; local anesthetic administered A antimicrobial bonded/coated triple lumen catheter was placed in the left internal jugular vein using the Seldinger technique.  Evaluation Blood flow good Complications: No apparent complications Patient did tolerate procedure well. Chest X-ray ordered to verify placement.  CXR: pending.  [name] 11/07/2012, 12:02 PM  [place]  [name]. [name] Alias, MD, FACP Pgr: [phone] [facility]

## 2012-11-07 NOTE — Progress Notes (Signed)
PULMONARY  / CRITICAL CARE MEDICINE  Name: [name] MRN: [id number] DOB: August 16, 1950    ADMISSION DATE:  11/05/2012 CONSULTATION DATE:  11/05/2012  REFERRING MD :  EDP PRIMARY SERVICE:  PCCM  CHIEF COMPLAINT:  Acute encephalopathy  BRIEF PATIENT DESCRIPTION: 62 yo NH resident with COPD, OSA, OHS (on BiPAP / oxygen) DM, HTN, and on multiple psychoactive Rx sent to [facility] ED with acute encephalopathy.  Hypercarbic in spite of NIMV.  Intubated.  SIGNIFICANT EVENTS / STUDIES:  5/5  Hypercarbic, failed BiPAP, intubated (dificult intubation - large tongue, small oropharynx, anterior larynx, morbid obesity) 5/5 head CT>>>No acute intracranial abnormality. Stable appearance of chronic small vessel disease. 5/6 patient self extubated and then reintubated  LINES / TUBES: OETT 5/5 >>>5/6 self extubate>>>5/7 reintubate OGT 5/5 >>> Foley 5/5 >>>  CULTURES: 5/6 urine culture>>>negative  ANTIBIOTICS: Vancomycin 5/5 >>> Aztreonam 5/5 >>>  SUBJECTIVE: Pt is more awake and alert today with sedation decreased.  VITAL SIGNS: Temp:  [98.4 F (36.9 C)-100.1 F (37.8 C)] 98.4 F (36.9 C) (05/07 0418) Pulse Rate:  [65-97] 74 (05/07 0700) Resp:  [16-25] 22 (05/07 0700) BP: (101-198)/(44-171) 101/65 mmHg (05/07 0700) SpO2:  [91 %-100 %] 98 % (05/07 0700) FiO2 (%):  [0 %-100 %] 40.1 % (05/07 0700) Weight:  [319 lb 14.2 oz (145.1 kg)] 319 lb 14.2 oz (145.1 kg) (05/07 0248) HEMODYNAMICS:   VENTILATOR SETTINGS: Vent Mode:  [-] PRVC FiO2 (%):  [0 %-100 %] 40.1 % Set Rate:  [24 bmp] 24 bmp Vt Set:  [420 mL-4240 mL] 420 mL PEEP:  [5 cmH20-5.2 cmH20] 5 cmH20 Plateau Pressure:  [26 cmH20-28 cmH20] 28 cmH20 INTAKE / OUTPUT: Intake/Output     05/06 0701 - 05/07 0700 05/07 0701 - 05/08 0700   I.V. (mL/kg) 1016.5 (7)    NG/GT 610    IV Piggyback 1200    Total Intake(mL/kg) 2826.5 (19.5)    Urine (mL/kg/hr) 1305 (0.4)    Total Output 1305     Net +1521.5           PHYSICAL  EXAMINATION: General:  Appears acutely ill, mechanically ventilated, synchronous, morbidly obese Neuro:  Minimally sedated. nonfocal Follows commands HEENT:  PERRL, OETT / OGT Cardiovascular:  RRR, no m/r/g Lungs:  Bilateral diminished air entry. No rales, rhonchi, or wheezing Abdomen:  Soft, nontender, bowel sounds diminished Musculoskeletal:  Moves all extremities, 2+ symmetric pitting edema, chronic stasis dermatitis with wound on left lower extremity Skin:  Warm and dry  LABS:  Recent Labs Lab 11/05/12 1701  11/05/12 1800  11/05/12 1831 11/05/12 1845  11/05/12 1949 11/05/12 2050 11/05/12 2151 11/05/12 2230 11/06/12 0335 11/06/12 0340 11/06/12 0550 11/06/12 0900 11/07/12 0325  HGB  --   --   --   --   --  11.2*  --   --   --   --   --   --   --  9.0*  --  9.0*  WBC  --   --   --   --   --   --   --   --   --   --   --   --   --  5.8  --  8.0  PLT  --   --   --   --   --   --   --   --   --   --   --   --   --  205  --  206  NA  --   --   --   < >  139 137  --   --   --   --   --   --   --  138  --  135  K  --   --   --   < > 3.6 3.5  --   --   --   --   --   --   --  4.0  --  3.4*  CL  --   --   --   < > 89* 85*  --   --   --   --   --   --   --  86*  --  87*  CO2  --   --   --   --  43*  --   --   --   --   --   --   --   --  >45*  --  43*  GLUCOSE  --   --   --   < > 147* 148*  --   --   --   --   --   --   --  96  --  109*  BUN  --   --   --   < > 12 12  --   --   --   --   --   --   --  13  --  26*  CREATININE  --   --   --   < > 0.80 1.00  --   --   --   --   --   --   --  0.79  --  1.27*  CALCIUM  --   --   --   --  9.6  --   --   --   --   --   --   --   --  9.4  --  8.7  MG  --   --   --   --   --   --   --   --   --   --   --   --   --  1.5  --   --   PHOS  --   --   --   --   --   --   --   --   --   --   --   --   --  2.7  --   --   AST  --   --   --   --  44*  --   --   --   --   --   --   --   --   --   --  35  ALT  --   --   --   --  19  --   --   --   --   --    --   --   --   --   --  12  ALKPHOS  --   --   --   --  80  --   --   --   --   --   --   --   --   --   --  66  BILITOT  --   --   --   --  0.2*  --   --   --   --   --   --   --   --   --   --  0.2*  PROT  --   --   --   --  8.3  --   --   --   --   --   --   --   --   --   --  6.7  ALBUMIN  --   --   --   --  3.4*  --   --   --   --   --   --   --   --   --   --  2.7*  LATICACIDVEN  --   --   --   --   --   --   < >  --   --  3.1*  --   --  1.5  --  2.6*  --   TROPONINI  --   < > <0.30  --   --   --   --   --   --  <0.30  --  <0.30  --   --  <0.30  --   PROCALCITON <0.10  --   --   --   --   --   --   --   --   --   --   --   --  0.12  --  0.22  PROBNP  --   --  332.1*  --   --   --   --   --   --   --   --   --   --   --   --   --   PHART  --   < >  --   --   --   --   --  7.274* 7.263*  --  7.368  --   --   --   --   --   PCO2ART  --   < >  --   --   --   --   --  110.8* 117.5*  --  89.7*  --   --   --   --   --   PO2ART  --   < >  --   --   --   --   --  100.0 159.0*  --  141.0*  --   --   --   --   --   < > = values in this interval not displayed.  Recent Labs Lab 11/06/12 1130 11/06/12 1548 11/06/12 1949 11/06/12 2351 11/07/12 0417  GLUCAP 85 87 135* 90 106*    CXR:  5/7>>>Stable lines and tubes. Stable ventilation with bilateral pleural effusions and lung base atelectasis or consolidation  ASSESSMENT / PLAN:  PULMONARY A: Acute on chronic respiratory failure.   Acute pulmonary edema- Less likely pneumonia (HCAP) - secondary to htn crisis Rx induced hypoventilation OSA/OHS COPD without exacerbation P:   Low O2 needs, sbt cpap 5 ps 5 , failed, needs PS 10 Neg balance goals; negative 412 mL past 24 hrs Albuterol / Atrovent Hold Flonase, Synbicort, Spiriva Control afterload further May consider bronch to rt base, RLL ATX? If continues to fail weaning  CARDIOVASCULAR A: CAD Hypertension/ Hypertensive emergency-improved P:  Goal systole 140 Continue ASA, Norvasc,  Clonidine Echo reviewed from 5/6 that showed LVEF of 60-65%.   RENAL A:  Fluid overload- negative 412 mL past 24 hrs  Hypokalemia Possible Acute Renal Failure- crt increase from 0.79-1.27 past 24 hrs P:   Trend BMP in am  Lasix 40 IV q12h  dc with crt trend Potassium Chloride Dc ACEI  GASTROINTESTINAL A:  Morbid obesity  GERD P:   TF  Protonix for GI Px Assess last BM, needs a BM, will add colace, dulx if no BM today  HEMATOLOGIC A:  Mild anemia. P:  Trend CBC Heparin for DVT Px PRBCs for hgb<7 Sub q hep to continue  INFECTIOUS A:  Questionable HCAP P:   On vanc and aztreonam Monitor fever curve and wbc Continue drt LL ATX?, may need bronch, add chest pt  ENDOCRINE  A:  Hyperglycemia-improved DM P:   SSI  NEUROLOGIC A:  Acute encephalopathy-EEG 5/6 negative. No evidence of epileptic disorder  P:   Consider staged restart of Abilify, Buspar, Cymbalta, Gabapentin, Oxycodone, Ambien (the need for this needs to be addressed prior to discharge) Propofol gtt wua Klonipin and Depakote fent to avoid wd from oxycontin Consider restarting Abilify  TODAY'S SUMMARY: Pt is more awake and alert today with propofol turned down. Patient's potassium replaced. Creatinine increasing, will continue to monitor renal function.  Dc lasix, may need line for volume status  Gery Pray, PA-S  I have personally obtained a history, examined the patient, evaluated laboratory and imaging results, formulated the assessment and plan and placed orders.  CRITICAL CARE:  The patient is critically ill with multiple organ systems failure and requires high complexity decision making for assessment and support, frequent evaluation and titration of therapies, application of advanced monitoring technologies and extensive interpretation of multiple databases. Critical Care Time devoted to patient care services described in this note is 35 minutes.   [name]. [name] Alias, MD, FACP Pgr:  [phone] [facility]

## 2012-11-07 NOTE — Procedures (Signed)
EEG NUMBER:  [id number].  REFERRING PHYSICIAN:  [name], MD  INDICATION FOR STUDY:  A 62 year old lady with COPD and obstructive sleep apnea, who was admitted with encephalopathy and requiring intubation and ventilatory support.  Study is being performed to assess severity of encephalopathy.  DESCRIPTION:  This is a routine EEG recording performed at the patient's bedside in the intensive care unit.  The patient was intubated, non- mechanical ventilation.  Background activity consisted of moderate amplitude, generalized, continuous, irregular delta and theta activity, which was symmetrical.  As well, symmetrical sleep spindles as well as K complexes were recorded.  Photic stimulation was not performed.  No epileptiform discharges were recorded.  There were no areas of disproportionate focal slowing.  INTERPRETATION:  This EEG shows continued generalized slowing with a pattern consistent with that of normal sleep.  No evidence of an epileptic disorder was seen.     Noel Christmas, MD    YN:[id number] D:  11/06/2012 15:44:43  T:  11/07/2012 01:32:16  Job #:  [id number]

## 2012-11-07 NOTE — Progress Notes (Signed)
Pt does not meet inclusion criteria for ICU Adult Electrolyte Protocol secondary to low urine out put>>0.29ml/kg/hr.

## 2012-11-07 NOTE — Progress Notes (Signed)
CRITICAL VALUE ALERT  Critical value received:  CO2  43  Date of notification:  11/07/12  Time of notification:  0438  Critical value read back:yes  Nurse who received alert:  A.Canary Brim RN  MD notified (1st page):  MD Z  Time of first page:  [phone]  MD notified (2nd page):  Time of second page:  Responding MD:  MD Z  Time MD responded:  [phone]

## 2012-11-08 ENCOUNTER — Inpatient Hospital Stay (HOSPITAL_COMMUNITY): Payer: Medicaid Other

## 2012-11-08 DIAGNOSIS — J309 Allergic rhinitis, unspecified: Secondary | ICD-10-CM

## 2012-11-08 LAB — BASIC METABOLIC PANEL
BUN: 33 mg/dL — ABNORMAL HIGH (ref 6–23)
CO2: 39 mEq/L — ABNORMAL HIGH (ref 19–32)
Chloride: 90 mEq/L — ABNORMAL LOW (ref 96–112)
Creatinine, Ser: 1.1 mg/dL (ref 0.50–1.10)

## 2012-11-08 LAB — MAGNESIUM: Magnesium: 1.9 mg/dL (ref 1.5–2.5)

## 2012-11-08 LAB — GLUCOSE, CAPILLARY
Glucose-Capillary: 106 mg/dL — ABNORMAL HIGH (ref 70–99)
Glucose-Capillary: 103 mg/dL — ABNORMAL HIGH (ref 70–99)
Glucose-Capillary: 96 mg/dL (ref 70–99)
Glucose-Capillary: 159 mg/dL — ABNORMAL HIGH (ref 70–99)

## 2012-11-08 LAB — CBC WITH DIFFERENTIAL/PLATELET
HCT: 26.2 % — ABNORMAL LOW (ref 36.0–46.0)
Hemoglobin: 8.6 g/dL — ABNORMAL LOW (ref 12.0–15.0)
Lymphocytes Relative: 20 % (ref 12–46)
MCHC: 32.8 g/dL (ref 30.0–36.0)
Monocytes Absolute: 0.5 10*3/uL (ref 0.1–1.0)
Monocytes Relative: 8 % (ref 3–12)
Neutro Abs: 4.1 10*3/uL (ref 1.7–7.7)
WBC: 6.1 10*3/uL (ref 4.0–10.5)

## 2012-11-08 MED ORDER — ALBUTEROL SULFATE (5 MG/ML) 0.5% IN NEBU
2.5000 mg | INHALATION_SOLUTION | RESPIRATORY_TRACT | Status: DC
  Administered 2012-11-08 – 2012-11-11 (×15): 2.5 mg via RESPIRATORY_TRACT
  Filled 2012-11-08 (×18): qty 0.5

## 2012-11-08 MED ORDER — ALBUTEROL SULFATE (5 MG/ML) 0.5% IN NEBU
2.5000 mg | INHALATION_SOLUTION | RESPIRATORY_TRACT | Status: DC | PRN
  Administered 2012-11-10 – 2012-11-11 (×2): 2.5 mg via RESPIRATORY_TRACT
  Filled 2012-11-08: qty 0.5

## 2012-11-08 MED ORDER — DULOXETINE HCL 60 MG PO CPEP
60.0000 mg | ORAL_CAPSULE | Freq: Two times a day (BID) | ORAL | Status: DC
  Administered 2012-11-08 – 2012-11-12 (×9): 60 mg via ORAL
  Filled 2012-11-08 (×11): qty 1

## 2012-11-08 MED ORDER — IPRATROPIUM BROMIDE 0.02 % IN SOLN
0.5000 mg | RESPIRATORY_TRACT | Status: DC
  Administered 2012-11-08 – 2012-11-11 (×16): 0.5 mg via RESPIRATORY_TRACT
  Filled 2012-11-08 (×18): qty 2.5

## 2012-11-08 MED ORDER — VANCOMYCIN HCL IN DEXTROSE 750-5 MG/150ML-% IV SOLN
750.0000 mg | Freq: Two times a day (BID) | INTRAVENOUS | Status: DC
  Filled 2012-11-08: qty 150

## 2012-11-08 MED ORDER — POTASSIUM CHLORIDE 20 MEQ/15ML (10%) PO LIQD
40.0000 meq | Freq: Once | ORAL | Status: AC
  Administered 2012-11-08: 40 meq
  Filled 2012-11-08: qty 30

## 2012-11-08 MED ORDER — BUSPIRONE HCL 10 MG PO TABS
10.0000 mg | ORAL_TABLET | Freq: Two times a day (BID) | ORAL | Status: DC
  Administered 2012-11-08 – 2012-11-12 (×9): 10 mg via ORAL
  Filled 2012-11-08 (×11): qty 1

## 2012-11-08 NOTE — Progress Notes (Signed)
Pt does not meet inclusion criteria for [facility] PCCM adult ICU electrolyte protocol secondary to low urine output.  Dr Marchelle Gearing notified of K 3.2

## 2012-11-08 NOTE — Progress Notes (Signed)
ANTIBIOTIC CONSULT NOTE - FOLLOW UP  Pharmacy Consult for Aztreonam Indication: Empiric HCAP coverage  Allergies  Allergen Reactions  . Bee Venom Other (See Comments)    Unknown  . Nyquil Cold & (Dm-Doxylamine-Acetaminophen) Other (See Comments)    Unknown   . Penicillins Other (See Comments)    unknown  . Ppd (Tuberculin Purified Protein Derivative) Other (See Comments)    unknown    Patient Measurements: Height: 5\' 2"  (157.5 cm) Weight: 319 lb 3.6 oz (144.8 kg) IBW/kg (Calculated) : 50.1  Vital Signs: Temp: 98.6 F (37 C) (05/08 1225) Temp src: Oral (05/08 1225) BP: 159/70 mmHg (05/08 1000) Pulse Rate: 90 (05/08 1221) Intake/Output from previous day: 05/07 0701 - 05/08 0700 In: 2454.5 [I.V.:994.5; NG/GT:810; IV Piggyback:650] Out: 1635 [Urine:1635] Intake/Output from this shift: Total I/O In: 60 [I.V.:60] Out: 425 [Urine:425]  Labs:  Recent Labs  11/06/12 0550 11/07/12 0325 11/08/12 0410  WBC 5.8 8.0 6.1  HGB 9.0* 9.0* 8.6*  PLT 205 206 185  CREATININE 0.79 1.27* 1.10   Estimated Creatinine Clearance: 72.7 ml/min (by C-G formula based on Cr of 1.1).  Recent Labs  11/08/12 0010  VANCOTROUGH 31.8*     Microbiology: Recent Results (from the past 720 hour(s))  MRSA PCR SCREENING     Status: None   Collection Time    11/05/12 11:22 PM      Result Value Range Status   MRSA by PCR NEGATIVE  NEGATIVE Final   Comment:            The GeneXpert MRSA Assay (FDA     approved for NASAL specimens     only), is one component of a     comprehensive MRSA colonization     surveillance program. It is not     intended to diagnose MRSA     infection nor to guide or     monitor treatment for     MRSA infections.  URINE CULTURE     Status: None   Collection Time    11/06/12 12:41 AM      Result Value Range Status   Specimen Description URINE, CATHETERIZED   Final   Special Requests vancocin   Final   Culture  Setup Time 11/06/2012 00:56   Final   Colony  Count NO GROWTH   Final   Culture NO GROWTH   Final   Report Status 11/07/2012 FINAL   Final    Anti-infectives   Start     Dose/Rate Route Frequency Ordered Stop   11/08/12 1800  vancomycin (VANCOCIN) IVPB 750 mg/150 ml premix  Status:  Discontinued     750 mg 150 mL/hr over 60 Minutes Intravenous Every 12 hours 11/08/12 0119 11/08/12 0952   11/06/12 1300  vancomycin (VANCOCIN) 1,500 mg in sodium chloride 0.9 % 500 mL IVPB  Status:  Discontinued     1,500 mg 250 mL/hr over 120 Minutes Intravenous Every 12 hours 11/06/12 0856 11/08/12 0119   11/06/12 0930  aztreonam (AZACTAM) 2 g in dextrose 5 % 50 mL IVPB     2 g 100 mL/hr over 30 Minutes Intravenous 3 times per day 11/06/12 0854 11/12/12 2359   11/05/12 2200  vancomycin (VANCOCIN) 2,500 mg in sodium chloride 0.9 % 500 mL IVPB     2,500 mg 250 mL/hr over 120 Minutes Intravenous  Once 11/05/12 2148 11/06/12 0155   11/05/12 2200  aztreonam (AZACTAM) 2 g in dextrose 5 % 50 mL IVPB     2  g 100 mL/hr over 30 Minutes Intravenous  Once 11/05/12 2148 11/05/12 2313      Assessment: 62 y.o. F with antibiotics de-escalated to Aztreonam alone this morning for continued r/o PNA coverage. Tmax/24: 99.4, WBC wnl, SCr 1.1, CrCl~60 ml/min. Dose remains appropriate at this time.   Vanc 5/5 >> 5/8 Azactam 5/5 >> (planned 5/12)  5/6 UCx >> NG  Goal of Therapy:  Proper antibiotics for infection/cultures adjusted for renal/hepatic function   Plan:  1. Continue Aztreonam 2g IV every 8 hours 2. Will continue to follow renal function, culture results, LOT, and antibiotic de-escalation plans   Georgina Pillion, PharmD, BCPS Clinical Pharmacist Pager: [phone] 11/08/2012 12:55 PM

## 2012-11-08 NOTE — Progress Notes (Addendum)
eLink Physician Progress Note and Electrolyte Replacement  Patient Name: [name] DOB: 1951/05/30 MRN: [id number]  Date of Service  11/08/2012   HPI/Events of Note    Recent Labs Lab 11/05/12 1831 11/05/12 1845 11/06/12 0550 11/07/12 0325 11/08/12 0410  NA 139 137 138 135 138  K 3.6 3.5 4.0 3.4* 3.2*  CL 89* 85* 86* 87* 90*  CO2 43*  --  >45* 43* 39*  GLUCOSE 147* 148* 96 109* 106*  BUN 12 12 13  26* 33*  CREATININE 0.80 1.00 0.79 1.27* 1.10  CALCIUM 9.6  --  9.4 8.7 8.5  MG  --   --  1.5  --   --   PHOS  --   --  2.7  --   --     Estimated Creatinine Clearance: 72.7 ml/min (by C-G formula based on Cr of 1.1).  Intake/Output     05/07 0701 - 05/08 0700   I.V. (mL/kg) 965.8 (6.7)   NG/GT 800   IV Piggyback 650   Total Intake(mL/kg) 2415.8 (16.7)   Urine (mL/kg/hr) 1635 (0.5)   Total Output 1635   Net +780.8       Stool Occurrence 3 x    - I/O DETAILED x 24h    Total I/O In: 634.3 [I.V.:424.3; NG/GT:110; IV Piggyback:100] Out: 795 [Urine:795] - I/O THIS SHIFT    ASSESSMENT Mild hypokalemia  eICURN Interventions  kcl via tube Add on to am lab mag and phos   ASSESSMENT: MAJOR ELECTROLYTE      Dr. Kalman Shan, M.D., [facility].C.P Pulmonary and Critical Care Medicine Staff Physician [facility] [place] [facility] Pager: [phone], If no answer or between  15:00h - 7:00h: call [phone]  11/08/2012 6:59 AM      [name] 11/08/2012, 6:59 AM

## 2012-11-08 NOTE — Progress Notes (Signed)
ANTIBIOTIC CONSULT NOTE   Pharmacy Consult for Vancomycin Indication: rule out pneumonia  Allergies  Allergen Reactions  . Bee Venom Other (See Comments)    Unknown  . Nyquil Cold & (Dm-Doxylamine-Acetaminophen) Other (See Comments)    Unknown   . Penicillins Other (See Comments)    unknown  . Ppd (Tuberculin Purified Protein Derivative) Other (See Comments)    unknown    Patient Measurements: Height: 5\' 2"  (157.5 cm) Weight: 319 lb 14.2 oz (145.1 kg) IBW/kg (Calculated) : 50.1  Vital Signs: Temp: 98.3 F (36.8 C) (05/07 2338) Temp src: Oral (05/07 2338) BP: 95/79 mmHg (05/08 0000) Pulse Rate: 60 (05/08 0000) Intake/Output from previous day: 05/07 0701 - 05/08 0700 In: 2107.8 [I.V.:767.8; NG/GT:740; IV Piggyback:600] Out: 1115 [Urine:1115] Intake/Output from this shift: Total I/O In: 326.3 [I.V.:226.3; NG/GT:50; IV Piggyback:50] Out: 275 [Urine:275]  Labs:  Recent Labs  11/05/12 1845 11/06/12 0550 11/07/12 0325  WBC  --  5.8 8.0  HGB 11.2* 9.0* 9.0*  PLT  --  205 206  CREATININE 1.00 0.79 1.27*   Estimated Creatinine Clearance: 63.1 ml/min (by C-G formula based on Cr of 1.27).  Recent Labs  11/08/12 0010  VANCOTROUGH 31.8*    Assessment: 62 yo Female with VDRF, possible PNA, for empiric antibiotics  Goal of Therapy:  Vancomycin trough level 15-20 mcg/ml  Plan:  Hold tonight's vancomycin dose, change vancomycin 750 mg IV q12h, next dose tonight at 6 pm F/U renal fxn  Geannie Risen, PharmD, BCPS

## 2012-11-08 NOTE — Procedures (Signed)
Extubation Procedure Note  Patient Details:   Name: [name] DOB: 1950/11/18 MRN: [id number]   Airway Documentation:  Airway 8 mm (Active)  Secured at (cm) 22 cm 11/08/2012  8:00 AM  Measured From Lips 11/08/2012  8:00 AM  Secured Location Center 11/08/2012  7:40 AM  Secured By [employer] 11/08/2012  8:00 AM  Tube Holder Repositioned Yes 11/08/2012  7:40 AM  Cuff Pressure (cm H2O) 24 cm H2O 11/07/2012 11:55 PM  Site Condition Dry 11/08/2012  7:40 AM    Evaluation  O2 sats: stable throughout Complications: No apparent complications Patient did tolerate procedure well. Bilateral Breath Sounds: Clear;Diminished Suctioning: Airway Yes  Pt extubated per MD order.  Pt placed on 4l [place]. RN at bedside.  Sats 98%, RT will continue to monitor.  Closson, Terie Purser 11/08/2012, 10:22 AM

## 2012-11-08 NOTE — Progress Notes (Signed)
PULMONARY  / CRITICAL CARE MEDICINE  Name: [name] MRN: [id number] DOB: [birth year]    ADMISSION DATE:  11/05/2012 CONSULTATION DATE:  11/05/2012  REFERRING MD :  EDP PRIMARY SERVICE:  PCCM  CHIEF COMPLAINT:  Acute encephalopathy  BRIEF PATIENT DESCRIPTION: 62 yo NH resident with COPD, OSA, OHS (on BiPAP / oxygen) DM, HTN, and on multiple psychoactive Rx sent to [facility] ED with acute encephalopathy.  Hypercarbic in spite of NIMV.  Intubated.  SIGNIFICANT EVENTS / STUDIES:  5/5  Hypercarbic, failed BiPAP, intubated (dificult intubation - large tongue, small oropharynx, anterior larynx, morbid obesity) 5/5 head CT>>>No acute intracranial abnormality. Stable appearance of chronic small vessel disease. 5/6 patient self extubated and then reintubated  LINES / TUBES: OETT 5/5 >>>5/6 self extubate>>>5/7 reintubate OGT 5/5 >>> Foley 5/5 >>>  CULTURES: 5/6 urine culture>>>negative  ANTIBIOTICS: Vancomycin 5/5 >>>5/8 Aztreonam 5/5 >>>  SUBJECTIVE: weaning well  VITAL SIGNS: Temp:  [97.7 F (36.5 C)-99.3 F (37.4 C)] 98.4 F (36.9 C) (05/08 0833) Pulse Rate:  [56-84] 81 (05/08 0805) Resp:  [16-25] 25 (05/08 0805) BP: (73-119)/(19-94) 104/47 mmHg (05/08 0805) SpO2:  [94 %-100 %] 96 % (05/08 0805) FiO2 (%):  [30 %-40.5 %] 30 % (05/08 0805) Weight:  [319 lb 3.6 oz (144.8 kg)] 319 lb 3.6 oz (144.8 kg) (05/08 0300) HEMODYNAMICS:   VENTILATOR SETTINGS: Vent Mode:  [-] PRVC FiO2 (%):  [30 %-40.5 %] 30 % Set Rate:  [24 bmp] 24 bmp Vt Set:  [0 mL-420 mL] 0 mL PEEP:  [5 cmH20-5.1 cmH20] 5 cmH20 Plateau Pressure:  [24 cmH20-27 cmH20] 26 cmH20 INTAKE / OUTPUT: Intake/Output     05/07 0701 - 05/08 0700 05/08 0701 - 05/09 0700   I.V. (mL/kg) 994.5 (6.9) 20 (0.1)   NG/GT 810 0   IV Piggyback 650    Total Intake(mL/kg) 2454.5 (17) 20 (0.1)   Urine (mL/kg/hr) 1635 (0.5)    Total Output 1635     Net +819.5 +20        Stool Occurrence 3 x     PHYSICAL EXAMINATION: General:  Then  best she has looked on wean, calm Neuro:  Minimally sedated. nonfocal Follows commands HEENT:  PERRL, OETT / OGT Cardiovascular:  RRR, no m/r/g Lungs:  Minimal scattered ronchi Abdomen:  Soft, nontender, bowel sounds diminished Musculoskeletal:  Moves all extremities,1+ symmetric pitting edema, chronic stasis dermatitis with wound on left lower extremity Skin:  Warm and dry  LABS:  Recent Labs Lab 11/05/12 1701  11/05/12 1800  11/05/12 1831  11/05/12 1949 11/05/12 2050 11/05/12 2151 11/05/12 2230 11/06/12 0335 11/06/12 0340 11/06/12 0550 11/06/12 0900 11/07/12 0325 11/08/12 0410  HGB  --   --   --   --   --   < >  --   --   --   --   --   --  9.0*  --  9.0* 8.6*  WBC  --   --   --   --   --   --   --   --   --   --   --   --  5.8  --  8.0 6.1  PLT  --   --   --   --   --   --   --   --   --   --   --   --  205  --  206 185  NA  --   --   --   --  139  < >  --   --   --   --   --   --  138  --  135 138  K  --   --   --   --  3.6  < >  --   --   --   --   --   --  4.0  --  3.4* 3.2*  CL  --   --   --   --  89*  < >  --   --   --   --   --   --  86*  --  87* 90*  CO2  --   --   --   < > 43*  --   --   --   --   --   --   --  >45*  --  43* 39*  GLUCOSE  --   --   --   --  147*  < >  --   --   --   --   --   --  96  --  109* 106*  BUN  --   --   --   --  12  < >  --   --   --   --   --   --  13  --  26* 33*  CREATININE  --   --   --   --  0.80  < >  --   --   --   --   --   --  0.79  --  1.27* 1.10  CALCIUM  --   --   --   < > 9.6  --   --   --   --   --   --   --  9.4  --  8.7 8.5  MG  --   --   --   --   --   --   --   --   --   --   --   --  1.5  --   --  1.9  PHOS  --   --   --   --   --   --   --   --   --   --   --   --  2.7  --   --  5.1*  AST  --   --   --   --  44*  --   --   --   --   --   --   --   --   --  35  --   ALT  --   --   --   --  19  --   --   --   --   --   --   --   --   --  12  --   ALKPHOS  --   --   --   --  80  --   --   --   --   --   --   --   --   --   66  --   BILITOT  --   --   --   --  0.2*  --   --   --   --   --   --   --   --   --  0.2*  --  PROT  --   --   --   --  8.3  --   --   --   --   --   --   --   --   --  6.7  --   ALBUMIN  --   --   --   --  3.4*  --   --   --   --   --   --   --   --   --  2.7*  --   LATICACIDVEN  --   --   --   --   --   < >  --   --  3.1*  --   --  1.5  --  2.6*  --   --   TROPONINI  --   < > <0.30  --   --   --   --   --  <0.30  --  <0.30  --   --  <0.30  --   --   PROCALCITON <0.10  --   --   --   --   --   --   --   --   --   --   --  0.12  --  0.22  --   PROBNP  --   --  332.1*  --   --   --   --   --   --   --   --   --   --   --   --   --   PHART  --   < >  --   --   --   --  7.274* 7.263*  --  7.368  --   --   --   --   --   --   PCO2ART  --   < >  --   --   --   --  110.8* 117.5*  --  89.7*  --   --   --   --   --   --   PO2ART  --   < >  --   --   --   --  100.0 159.0*  --  141.0*  --   --   --   --   --   --   < > = values in this interval not displayed.  Recent Labs Lab 11/07/12 1515 11/07/12 1934 11/07/12 2337 11/08/12 0427 11/08/12 0800  GLUCAP 100* 105* 112* 106* 92    CXR:  5/8>>>improved technique, atx rt  ASSESSMENT / PLAN:  PULMONARY A: Acute on chronic respiratory failure.   Acute pulmonary edema- Less likely pneumonia (HCAP) - secondary to htn crisis Rx induced hypoventilation OSA/OHS COPD without exacerbation P:   cpap5 ps 5, goal 1 hr, assess rsbi Mental status supports extubation  Albuterol / Atrovent Hold Flonase, Synbicort, Spiriva Control afterload further- sys 110-150 at goal   CARDIOVASCULAR Echo reviewed from 5/6 that showed LVEF of 60-65%.  A: CAD Hypertension/ Hypertensive emergency-improved P:  Goal systole 140, met Continue ASA, Norvasc, Clonidine (not hold)  RENAL A:  Fluid overload- negative 412 mL past 24 hrs  Hypokalemia Possible Acute Renal Failure- crt increase from 0.79-1.27 past 24 hrs P:   Trend BMP in am  Lasix continue to hold,  crt better holding Allow pos balance Potassium Chloride Dc ACEI  GASTROINTESTINAL A:  Morbid obesity  GERD P:   TF held weaning Protonix for GI Px  BM noted Yeah!!  HEMATOLOGIC A:  Mild anemia. P:  Sub q hep to continue  INFECTIOUS A:  Questionable HCAP P:   Dc vanc and continue aztreonam Add stop date, total 7 days  ENDOCRINE  A:  Hyperglycemia-improved DM P:   SSI  NEUROLOGIC A:  Acute encephalopathy-EEG 5/6 negative. No evidence of epileptic disorder  P:   Consider staged restart of Abilify, (today)Buspar, (today) Cymbalta, Gabapentin, Oxycodone, Ambien (the need for this needs to be addressed prior to discharge) Propofol gtt off wua Klonipin and Depakote fent scheduled to avoid wd from oxycontin  TODAY'S SUMMARY: weaning well, extubate is goal, no lasix  Gery Pray, PA-S  I have personally obtained a history, examined the patient, evaluated laboratory and imaging results, formulated the assessment and plan and placed orders.  CRITICAL CARE:  The patient is critically ill with multiple organ systems failure and requires high complexity decision making for assessment and support, frequent evaluation and titration of therapies, application of advanced monitoring technologies and extensive interpretation of multiple databases. Critical Care Time devoted to patient care services described in this note is 35 minutes.   [name]. [name] Alias, MD, FACP Pgr: [phone] [place] [facility]

## 2012-11-09 ENCOUNTER — Encounter (HOSPITAL_COMMUNITY): Payer: Self-pay | Admitting: *Deleted

## 2012-11-09 ENCOUNTER — Inpatient Hospital Stay (HOSPITAL_COMMUNITY): Payer: Medicaid Other

## 2012-11-09 DIAGNOSIS — I10 Essential (primary) hypertension: Secondary | ICD-10-CM | POA: Insufficient documentation

## 2012-11-09 DIAGNOSIS — E1149 Type 2 diabetes mellitus with other diabetic neurological complication: Secondary | ICD-10-CM | POA: Insufficient documentation

## 2012-11-09 DIAGNOSIS — D638 Anemia in other chronic diseases classified elsewhere: Secondary | ICD-10-CM | POA: Insufficient documentation

## 2012-11-09 LAB — BASIC METABOLIC PANEL
Calcium: 8.5 mg/dL (ref 8.4–10.5)
Creatinine, Ser: 0.86 mg/dL (ref 0.50–1.10)
GFR calc Af Amer: 82 mL/min — ABNORMAL LOW (ref >90)
Sodium: 142 mEq/L (ref 135–145)

## 2012-11-09 LAB — GLUCOSE, CAPILLARY
Glucose-Capillary: 108 mg/dL — ABNORMAL HIGH (ref 70–99)
Glucose-Capillary: 91 mg/dL (ref 70–99)

## 2012-11-09 LAB — BLOOD GAS, ARTERIAL
Acid-Base Excess: 10.5 mmol/L — ABNORMAL HIGH (ref 0.0–2.0)
Drawn by: 246861
O2 Content: 4 L/min
O2 Saturation: 97 %
Patient temperature: 98.6
pO2, Arterial: 93.1 mmHg (ref 80.0–100.0)

## 2012-11-09 LAB — TROPONIN I: Troponin I: <0.3 ng/mL (ref <0.30)

## 2012-11-09 MED ORDER — FLUTICASONE PROPIONATE 50 MCG/ACT NA SUSP
2.0000 | Freq: Every day | NASAL | Status: DC
  Administered 2012-11-09 – 2012-11-12 (×4): 2 via NASAL
  Filled 2012-11-09: qty 16

## 2012-11-09 MED ORDER — PANTOPRAZOLE SODIUM 40 MG PO TBEC
40.0000 mg | DELAYED_RELEASE_TABLET | Freq: Every day | ORAL | Status: DC
  Administered 2012-11-10 – 2012-11-12 (×3): 40 mg via ORAL
  Filled 2012-11-09 (×3): qty 1

## 2012-11-09 MED ORDER — FUROSEMIDE 10 MG/ML IJ SOLN
40.0000 mg | Freq: Once | INTRAMUSCULAR | Status: AC
  Administered 2012-11-09: 40 mg via INTRAVENOUS
  Filled 2012-11-09: qty 4

## 2012-11-09 MED ORDER — MORPHINE SULFATE ER 15 MG PO TBCR
15.0000 mg | EXTENDED_RELEASE_TABLET | Freq: Two times a day (BID) | ORAL | Status: DC
  Administered 2012-11-09 (×2): 15 mg via ORAL
  Filled 2012-11-09 (×2): qty 1

## 2012-11-09 MED ORDER — WHITE PETROLATUM GEL
Status: AC
  Administered 2012-11-09: 0.2
  Filled 2012-11-09: qty 5

## 2012-11-09 MED ORDER — INSULIN ASPART 100 UNIT/ML ~~LOC~~ SOLN
0.0000 [IU] | Freq: Three times a day (TID) | SUBCUTANEOUS | Status: DC
  Administered 2012-11-10 – 2012-11-11 (×2): 2 [IU] via SUBCUTANEOUS

## 2012-11-09 MED ORDER — POTASSIUM CHLORIDE CRYS ER 20 MEQ PO TBCR
20.0000 meq | EXTENDED_RELEASE_TABLET | ORAL | Status: AC
  Administered 2012-11-09 (×2): 20 meq via ORAL
  Filled 2012-11-09 (×2): qty 1

## 2012-11-09 MED ORDER — BUDESONIDE-FORMOTEROL FUMARATE 160-4.5 MCG/ACT IN AERO
2.0000 | INHALATION_SPRAY | Freq: Two times a day (BID) | RESPIRATORY_TRACT | Status: DC
  Administered 2012-11-10 – 2012-11-12 (×4): 2 via RESPIRATORY_TRACT
  Filled 2012-11-09: qty 6

## 2012-11-09 NOTE — Progress Notes (Signed)
PROGRESS NOTE  DATE: 11/05/12  FACILITY: Nursing Home Location: [facility]  LEVEL OF CARE: SNF (31)  Routine Visit  CHIEF COMPLAINT:  Manage COPD, hypertension and anemia of chronic disease  HISTORY OF PRESENT ILLNESS:  REASSESSMENT OF ONGOING PROBLEM(S):  1. COPD: the COPD remains stable.  Pt denies sob, cough, wheezing or declining exercise tolerance.  No complications from the medications presently being used.  2.HTN: Pt 's HTN remains stable.  Denies CP, sob, DOE, headaches, dizziness or visual disturbances.  No complications from the medications currently being used.  Last BP :149/68.  3. ANEMIA: The anemia has been stable. The patient denies fatigue, melena or hematochezia. No complications from the medications currently being used. In 11/13 hemoglobin 9.2 MCV 85.  PAST MEDICAL HISTORY : Reviewed.  No changes.  CURRENT MEDICATIONS: Reviewed per [facility]  REVIEW OF SYSTEMS:  GENERAL: no change in appetite, no fatigue, no weight changes, no fever, chills or weakness RESPIRATORY: no cough, SOB, DOE, wheezing, hemoptysis CARDIAC: no chest pain, bilateral lower extremity edema present. No palpitations GI: no abdominal pain, diarrhea, constipation, heart burn, nausea or vomiting  PHYSICAL EXAMINATION  VS:  T 98.2       P 60      RR 16     BP 149/68     POX %     WT (Lb) 319  GENERAL: no acute distress, morbidly obese body habitus EYES: conjunctivae normal, sclerae normal, normal eye lids NECK: supple, trachea midline, no neck masses, no thyroid tenderness, no thyromegaly LYMPHATICS: no LAN in the neck, no supraclavicular LAN RESPIRATORY: breathing is even & unlabored, BS CTAB CARDIAC: RRR, no murmur,no extra heart sounds, +2 bilateral lower extremity edema GI: abdomen soft, normal BS, no masses, no tenderness, no hepatomegaly, no splenomegaly PSYCHIATRIC: the patient is alert & oriented to person, affect & behavior appropriate  LABS/RADIOLOGY:  12/13  BMP normal, hemoglobin A1c 6.2, Depakote 46 11/13 hemoglobin 9.2, MCV 85 otherwise CBC normal, liver profile normal, fasting lipid panel normal  ASSESSMENT/PLAN:  1. COPD-compensated. 2. hypertension-stable. 3. anemia of chronic disease stable. 4. diabetes mellitus with neuropathy-well-controlled. 5. peripheral neuropathy-continue Neurontin. 6. constipation-well controlled. 7. lower extremity edema -- stable. 8. depression-Abilify was increased. 9. check CBC, CMP, Depakote level and hemoglobin A1c.  CPT CODE: 16109

## 2012-11-09 NOTE — Progress Notes (Addendum)
Dr Mervyn Skeeters Siquerios came to examine pt. Orders received after reviewing CXR. Pt given 40 IV Lasix

## 2012-11-09 NOTE — Evaluation (Signed)
Physical Therapy Evaluation Patient Details Name: [name] MRN: [id number] DOB: Jan 26, 1951 Today's Date: 11/09/2012 Time: 7829-5621 PT Time Calculation (min): 16 min  PT Assessment / Plan / Recommendation Clinical Impression  Pt is a 62 y.o. female resident of nursing home. Patient states that she is able to transfer and care for herself but contradicts this is inconsistant with information provided throughout session. Patient had difficulty tolerating static sitting without posterior support. SpO2 dropped into the upper 80s, patient with increased WOB and effort. HR elevated with activity. Will trial PT with use of sara - plus to assess ability to stand and transfer.     PT Assessment  Patient needs continued PT services    Follow Up Recommendations  SNF    Does the patient have the potential to tolerate intense rehabilitation      Barriers to Discharge        Equipment Recommendations  None recommended by PT    Recommendations for Other Services     Frequency Min 2X/week    Precautions / Restrictions Restrictions Weight Bearing Restrictions: No   Pertinent Vitals/Pain No pain reported, HR elevated with activity, spo2 desat to 86% with minimal exersion on 3 liters      Mobility  Bed Mobility Bed Mobility: Not assessed Transfers Transfers: Not assessed Details for Transfer Assistance: attempted to engage in pre transfer activity; patient unable to tolerate Ambulation/Gait Ambulation/Gait Assistance: Not tested (comment)        PT Diagnosis: Generalized weakness  PT Problem List: Decreased strength;Decreased range of motion;Decreased activity tolerance;Decreased balance;Decreased mobility;Cardiopulmonary status limiting activity;Obesity PT Treatment Interventions: DME instruction;Functional mobility training;Therapeutic activities;Therapeutic exercise;Balance training;Patient/family education   PT Goals Acute Rehab PT Goals PT Goal Formulation: With  patient Time For Goal Achievement: 11/23/12 Potential to Achieve Goals: Fair Pt will Transfer Bed to Chair/Chair to Bed: with supervision PT Transfer Goal: Bed to Chair/Chair to Bed - Progress: Goal set today  Visit Information  Last PT Received On: 11/09/12 Assistance Needed: +2    Subjective Data  Subjective: It's hard work Patient Stated Goal: to feel better   Prior Functioning  Home Living Available Help at Discharge: Skilled Nursing Facility Prior Function Level of Independence: Needs assistance Needs Assistance: Dressing;Feeding;Toileting;Transfers    Cognition  Cognition Arousal/Alertness: Awake/alert Behavior During Therapy: WFL for tasks assessed/performed Overall Cognitive Status: No family/caregiver present to determine baseline cognitive functioning    Extremity/Trunk Assessment Right Upper Extremity Assessment RUE ROM/Strength/Tone: [facility] for tasks assessed Left Upper Extremity Assessment LUE ROM/Strength/Tone: WFL for tasks assessed Right Lower Extremity Assessment RLE ROM/Strength/Tone: Deficits;Unable to fully assess RLE ROM/Strength/Tone Deficits: MMT demonstrates range limited by body habitus Left Lower Extremity Assessment LLE ROM/Strength/Tone: Deficits;Unable to fully assess LLE ROM/Strength/Tone Deficits: MMT demonstrates range limited by body habitus   Balance Balance Balance Assessed: Yes Static Sitting Balance Static Sitting - Balance Support: Feet supported Static Sitting - Level of Assistance: 3: Mod assist Static Sitting - Comment/# of Minutes: 3 trials 2 minutes each with rest break between to imrpvoe 02 saturationg. Pt with difficulty maintaining balance. Increase WOB and effort to perform.  End of Session PT - End of Session Activity Tolerance: Patient limited by fatigue (Poor activity tolerance) Patient left: in chair;with call bell/phone within reach;Other (comment) (lift pad in place) Nurse Communication: Need for lift equipment  GP      Fabio Asa 11/09/2012, 3:55 PM  Charlotte Crumb, PT DPT  [phone]

## 2012-11-09 NOTE — Progress Notes (Addendum)
ABG result called by resp therapy noted on 02 4 l n/c ph 7.38 PO2 93.1 PCO2 61.6 rr 26 turned 02 down to 2 L n/c  Called radiology to check on Stat CXR says will be coming soon.

## 2012-11-09 NOTE — Progress Notes (Signed)
Port CXR done in room Dr Frederico Hamman called to check on pt's condition given ABG results .

## 2012-11-09 NOTE — Progress Notes (Signed)
Patient removed herself from bipap. Patient does not want to wear it anymore. RT will continue to monitor.

## 2012-11-09 NOTE — Progress Notes (Signed)
NUTRITION FOLLOW UP  Intervention:   None needed at this time.  Nutrition Dx:   Inadequate oral intake related to inability to eat as evidenced by NPO status. Resolved  No new nutrition diagnosis at this time.  Goal:   Intake to meet >90% of estimated nutrition needs. Met.  Monitor:   PO intake, labs, weight trend.  Assessment:   Patient was extubated on 5/8. Eating very well per RN; consuming 100% of all meals. Current intake is adequate to meet nutrition needs.  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; skin tear  Diet Order: Carb Control   Intake/Output Summary (Last 24 hours) at 11/09/12 1140 Last data filed at 11/09/12 1100  Gross per 24 hour  Intake   1160 ml  Output   2785 ml  Net  -1625 ml    Last BM: 5/8   Labs:   Recent Labs Lab 11/06/12 0550 11/07/12 0325 11/08/12 0410 11/09/12 0435  NA 138 135 138 142  K 4.0 3.4* 3.2* 3.5  CL 86* 87* 90* 96  CO2 >45* 43* 39* 41*  BUN 13 26* 33* 19  CREATININE 0.79 1.27* 1.10 0.86  CALCIUM 9.4 8.7 8.5 8.5  MG 1.5  --  1.9  --   PHOS 2.7  --  5.1*  --   GLUCOSE 96 109* 106* 102*    CBG (last 3)   Recent Labs  11/08/12 2341 11/09/12 0424 11/09/12 0745  GLUCAP 108* 97 91    Scheduled Meds: . albuterol  2.5 mg Nebulization Q4H  . amLODipine  5 mg Oral Daily  . antiseptic oral rinse  15 mL Mouth Rinse QID  . ARIPiprazole  10 mg Oral Daily  . aspirin  325 mg Oral Daily  . budesonide-formoterol  2 puff Inhalation BID  . busPIRone  10 mg Oral BID  . clonazePAM  0.5 mg Oral Daily  . cloNIDine  0.2 mg Oral BID  . docusate  100 mg Oral Daily  . DULoxetine  60 mg Oral BID  . fluticasone  2 spray Each Nare Daily  . gabapentin  400 mg Oral TID  . heparin  subcutaneous  5,000 Units Subcutaneous Q8H  . insulin aspart  0-15 Units Subcutaneous Q4H  . ipratropium  0.5 mg Nebulization Q4H  . morphine  15 mg Oral BID  . [START ON 11/10/2012] pantoprazole  40 mg Oral Daily  . Valproic Acid  500 mg Oral BID    Continuous Infusions: . sodium chloride 20 mL/hr at 11/07/12 0700  . propofol 10 mcg/kg/min (11/08/12 0700)    Joaquin Courts, RD, LDN, CNSC Pager [phone] After Hours Pager [phone]

## 2012-11-09 NOTE — Progress Notes (Signed)
Pt transferred in bed, with RN. Pt placed on bipap per RT. SBP 171 elink RN aware and will notify MD. Other VVS. Pt alert to self and place, able to follow commands, no complaint of pain at this time. Will continue to monitor.

## 2012-11-09 NOTE — Progress Notes (Addendum)
Called report to [place] [facility] t to be transferred to 3304 and order to place pt on Bipap per Dr Frederico Hamman

## 2012-11-09 NOTE — Progress Notes (Signed)
Clinical Social Worker received notification from RN that pt's dtr had questions related to placement and insurance.  CSW phoned pt's dtr. CSW reviewed Medicare requirements with dtr.  CSW provided active listening.  CSW encouraged pt and family to voice concerns with Cheyenne Adas to assist with Problem Solving.  CSW to begin SNF search as back up in case pt and family not able to problem solve with SNF.    Angelia Mould, MSW, [place] [phone]

## 2012-11-09 NOTE — Progress Notes (Signed)
UR Completed.  [name] [phone] 11/09/2012  

## 2012-11-09 NOTE — Progress Notes (Signed)
Call to [facility] box left message for Dr Frederico Hamman to call regarding change in Pt's condition.

## 2012-11-09 NOTE — Progress Notes (Signed)
Pt transferred from 2100 to 4705. Oriented to room. 02 at 2 L n/c. VS done placed on telemetry monitor. Called CMT to confirm.

## 2012-11-09 NOTE — Progress Notes (Signed)
PULMONARY  / CRITICAL CARE MEDICINE  Name: [name] MRN: [id number] DOB: 1951-05-13    ADMISSION DATE:  11/05/2012 CONSULTATION DATE:  11/05/2012  REFERRING MD :  EDP PRIMARY SERVICE:  PCCM  CHIEF COMPLAINT:  Acute encephalopathy  BRIEF PATIENT DESCRIPTION: 62 yo NH resident with COPD, OSA, OHS (on BiPAP / oxygen) DM, HTN, and on multiple psychoactive Rx sent to [facility] ED with acute encephalopathy.  Hypercarbic in spite of NIMV.  Intubated.  SIGNIFICANT EVENTS / STUDIES:  5/5  Hypercarbic, failed BiPAP, intubated (dificult intubation - large tongue, small oropharynx, anterior larynx, morbid obesity) 5/5 head CT>>>No acute intracranial abnormality. Stable appearance of chronic small vessel disease. 5/6 patient self extubated and then reintubated 5/8- extubated  LINES / TUBES: OETT 5/5 >>>5/6 self extubate>>>5/7 reintubate>>>5/8 OGT 5/5 >>>out Foley 5/5 >>>plan out 5/9  CULTURES: 5/6 urine culture>>>negative  ANTIBIOTICS: Vancomycin 5/5 >>>5/8 Aztreonam 5/5 >>>5/9 (pcr neg)  SUBJECTIVE: In a chair watching [employer]  VITAL SIGNS: Temp:  [98 F (36.7 C)-99.2 F (37.3 C)] 99.1 F (37.3 C) (05/09 0800) Pulse Rate:  [82-104] 95 (05/09 1000) Resp:  [18-29] 25 (05/09 1000) BP: (96-141)/(32-99) 123/69 mmHg (05/09 1000) SpO2:  [94 %-100 %] 97 % (05/09 1000) Weight:  [317 lb 14.5 oz (144.2 kg)] 317 lb 14.5 oz (144.2 kg) (05/09 0500) HEMODYNAMICS:   VENTILATOR SETTINGS:   INTAKE / OUTPUT: Intake/Output     05/08 0701 - 05/09 0700 05/09 0701 - 05/10 0700   P.O. 600    I.V. (mL/kg) 480 (3.3) 10 (0.1)   NG/GT 0    IV Piggyback 150    Total Intake(mL/kg) 1230 (8.5) 10 (0.1)   Urine (mL/kg/hr) 2810 (0.8) 200 (0.3)   Stool 10 (0)    Total Output 2820 200   Net -1590 -190        Stool Occurrence 3 x 1 x    PHYSICAL EXAMINATION: General:  No distress Neuro:  nonfocal  HEENT:  PERRL, obese Cardiovascular:  RRR, no m/r/g Lungs:  Minimal scattered ronchi resolved Abdomen:   Soft, nontender, bowel sounds diminished Musculoskeletal:  Moves all extremities,1+ symmetric pitting edema, chronic stasis dermatitis with wound on left lower extremity Skin:  Warm and dry  LABS:  Recent Labs Lab 11/05/12 1701  11/05/12 1800  11/05/12 1831  11/05/12 1949 11/05/12 2050 11/05/12 2151 11/05/12 2230 11/06/12 0335 11/06/12 0340 11/06/12 0550 11/06/12 0900 11/07/12 0325 11/08/12 0410 11/09/12 0435  HGB  --   --   --   --   --   < >  --   --   --   --   --   --  9.0*  --  9.0* 8.6*  --   WBC  --   --   --   --   --   --   --   --   --   --   --   --  5.8  --  8.0 6.1  --   PLT  --   --   --   --   --   --   --   --   --   --   --   --  205  --  206 185  --   NA  --   --   --   --  139  < >  --   --   --   --   --   --  138  --  135 138  142  K  --   --   --   --  3.6  < >  --   --   --   --   --   --  4.0  --  3.4* 3.2* 3.5  CL  --   --   --   --  89*  < >  --   --   --   --   --   --  86*  --  87* 90* 96  CO2  --   --   --   < > 43*  --   --   --   --   --   --   --  >45*  --  43* 39* 41*  GLUCOSE  --   --   --   --  147*  < >  --   --   --   --   --   --  96  --  109* 106* 102*  BUN  --   --   --   --  12  < >  --   --   --   --   --   --  13  --  26* 33* 19  CREATININE  --   --   --   --  0.80  < >  --   --   --   --   --   --  0.79  --  1.27* 1.10 0.86  CALCIUM  --   --   --   < > 9.6  --   --   --   --   --   --   --  9.4  --  8.7 8.5 8.5  MG  --   --   --   --   --   --   --   --   --   --   --   --  1.5  --   --  1.9  --   PHOS  --   --   --   --   --   --   --   --   --   --   --   --  2.7  --   --  5.1*  --   AST  --   --   --   --  44*  --   --   --   --   --   --   --   --   --  35  --   --   ALT  --   --   --   --  19  --   --   --   --   --   --   --   --   --  12  --   --   ALKPHOS  --   --   --   --  80  --   --   --   --   --   --   --   --   --  66  --   --   BILITOT  --   --   --   --  0.2*  --   --   --   --   --   --   --   --   --  0.2*  --   --    PROT  --   --   --   --  8.3  --   --   --   --   --   --   --   --   --  6.7  --   --   ALBUMIN  --   --   --   --  3.4*  --   --   --   --   --   --   --   --   --  2.7*  --   --   LATICACIDVEN  --   --   --   --   --   < >  --   --  3.1*  --   --  1.5  --  2.6*  --   --   --   TROPONINI  --   < > <0.30  --   --   --   --   --  <0.30  --  <0.30  --   --  <0.30  --   --   --   PROCALCITON <0.10  --   --   --   --   --   --   --   --   --   --   --  0.12  --  0.22  --   --   PROBNP  --   --  332.1*  --   --   --   --   --   --   --   --   --   --   --   --   --   --   PHART  --   < >  --   --   --   --  7.274* 7.263*  --  7.368  --   --   --   --   --   --   --   PCO2ART  --   < >  --   --   --   --  110.8* 117.5*  --  89.7*  --   --   --   --   --   --   --   PO2ART  --   < >  --   --   --   --  100.0 159.0*  --  141.0*  --   --   --   --   --   --   --   < > = values in this interval not displayed.  Recent Labs Lab 11/08/12 1450 11/08/12 1934 11/08/12 2341 11/09/12 0424 11/09/12 0745  GLUCAP 96 159* 108* 97 91    CXR:  5/8>>>improved technique, atx rt  ASSESSMENT / PLAN:  PULMONARY A: Acute on chronic respiratory failure.   Acute pulmonary edema- Less likely pneumonia (HCAP) - secondary to htn crisis Rx induced hypoventilation OSA/OHS COPD without exacerbation P:  BDers Flonase, Synbicort Restart Spiriva upon dc Bp controlled Enjoyed neg balance  CARDIOVASCULAR Echo reviewed from 5/6 that showed LVEF of 60-65%.  A: CAD Hypertension/ Hypertensive emergency-improved P:  Goal systole met Continue ASA, Norvasc, Clonidine  RENAL A:  Fluid overload- negative 412 mL past 24 hrs  Hypokalemia Possible Acute Renal Failure- crt increase from 0.79-1.27 past 24 hrs P:   Trend BMP in am  Autodiuresis, allow Potassium Chloride Dc ACEI, rstart pre dc if crt remains wnl  GASTROINTESTINAL A:  Morbid obesity  GERD P:   Diet, advance carb mod ppi  HEMATOLOGIC A:  Mild  anemia. P:  Sub  q hep to continue Avoid daily blood draws Ambulation unable as wheelchair bound  INFECTIOUS A:  Questionable HCAP, unimpressive, likley was HTN afterload pulm edema P:   Dc all abx, pct neg   ENDOCRINE  A:  Hyperglycemia-improved DM P:   SSI Carb mod  NEUROLOGIC A:  Acute encephalopathy-EEG 5/6 negative. No evidence of epileptic disorder  P:   Consider staged restart of Abilify, (today)Buspar, (today) Cymbalta, Gabapentin, Oxycodone, Ambien (the need for this needs to be addressed prior to discharge) Klonipin and Depakote fent scheduled to avoid wd from oxycontin Wheelchair bound  TODAY'S SUMMARY: to floor tele, triad   I have personally obtained a history, examined the patient, evaluated laboratory and imaging results, formulated the assessment and plan and placed orders.   [name]. [name] Alias, MD, FACP Pgr: [phone] [facility]

## 2012-11-09 NOTE — Progress Notes (Signed)
[facility] ADULT ICU REPLACEMENT PROTOCOL FOR AM LAB REPLACEMENT ONLY  The patient does apply for the [facility] Adult ICU Electrolyte Replacment Protocol based on the criteria listed below:   1. Is GFR >/= 40 ml/min? yes  Patient's GFR today is 70 2. Is urine output >/= 0.5 ml/kg/hr for the last 6 hours? yes Patient's UOP is .84 ml/kg/hr 3. Is BUN < 60 mg/dL? yes  Patient's BUN today is 19 4. Abnormal electrolyte(s): K 3.5 5. Ordered repletion with: per protocol 6. If a panic level lab has been reported, has the CCM MD in charge been notified? yes.   Physician:  Dr Sharol Roussel 11/09/2012 6:17 AM

## 2012-11-09 NOTE — Progress Notes (Signed)
Pt having ST entered room pt says she does not feel well. Tachypnea noted RR 28 to 32 sat dropped to 88 % o2 at 2 L n/c  VS done Neuro VS done see flow sheet. Inc of urine no noted seizure activity noted. Able to talk spech clear but confused says I need to go to the ED. Increased 02 to 4 l n/c 02 sat back up to 99 % .

## 2012-11-09 NOTE — Progress Notes (Signed)
Called rapid response to assess pt. 1737 pt now c/o chest pain 6 on scale of 1 to 10  12 lead EKG done rate 110 ST with freq PVC's.

## 2012-11-09 NOTE — Evaluation (Signed)
Called to evaluate to the floor or respiratory distress. Apparently she got confused, with respiratory distress, O2 sat to 88% and HTN with SBP in the 190's. At the time of my exam the patient is breathing comfortably on O2 at 2 L via [place], she is oriented x3 and her BP is 130/80. I suspect she had some flash pulmonary edema associated to her elevated BP. ABG: 7.38 / 61 / 93 Her chest X ray is mostly unchanged when compared to yesterday.  Plan: - We will transfer to 3304 for closer observation - Will give one dose of lasix 40 mg IV - Will use BiPAP if needed - Needs better control of her BP.  Overton Mam, MD [facility] PCCM

## 2012-11-09 NOTE — Progress Notes (Signed)
Dr Frederico Hamman returned call orders received. Ordered stat CXR and ABG's.

## 2012-11-09 NOTE — Progress Notes (Signed)
RT placed patient on bipap 12/6, 40%. Patient is tolerating bipap well at this time. RT will continue to monitor.

## 2012-11-10 DIAGNOSIS — R0902 Hypoxemia: Secondary | ICD-10-CM

## 2012-11-10 LAB — GLUCOSE, CAPILLARY: Glucose-Capillary: 122 mg/dL — ABNORMAL HIGH (ref 70–99)

## 2012-11-10 LAB — BASIC METABOLIC PANEL
Chloride: 97 mEq/L (ref 96–112)
GFR calc Af Amer: 83 mL/min — ABNORMAL LOW (ref >90)
Potassium: 3.7 mEq/L (ref 3.5–5.1)

## 2012-11-10 LAB — TROPONIN I
Troponin I: <0.3 ng/mL (ref <0.30)
Troponin I: <0.3 ng/mL (ref <0.30)

## 2012-11-10 MED ORDER — DEXTROSE 5 % IV SOLN
2.0000 g | Freq: Three times a day (TID) | INTRAVENOUS | Status: DC
  Administered 2012-11-10 – 2012-11-12 (×6): 2 g via INTRAVENOUS
  Filled 2012-11-10 (×8): qty 2

## 2012-11-10 MED ORDER — TIOTROPIUM BROMIDE MONOHYDRATE 18 MCG IN CAPS
18.0000 ug | ORAL_CAPSULE | Freq: Every day | RESPIRATORY_TRACT | Status: DC
  Administered 2012-11-10 – 2012-11-11 (×2): 18 ug via RESPIRATORY_TRACT
  Filled 2012-11-10: qty 5

## 2012-11-10 MED ORDER — OXYCODONE-ACETAMINOPHEN 5-325 MG PO TABS
1.0000 | ORAL_TABLET | ORAL | Status: DC | PRN
  Administered 2012-11-10: 1 via ORAL
  Filled 2012-11-10 (×2): qty 1

## 2012-11-10 MED ORDER — PHENOL 1.4 % MT LIQD
1.0000 | OROMUCOSAL | Status: DC | PRN
  Administered 2012-11-10: 1 via OROMUCOSAL
  Filled 2012-11-10: qty 177

## 2012-11-10 NOTE — Progress Notes (Signed)
Pt coughing up small amount of blood in sputum. Will continue to monitor

## 2012-11-10 NOTE — Progress Notes (Signed)
ANTIBIOTIC CONSULT NOTE - FOLLOW UP  Pharmacy Consult for Aztreonam Indication: Empiric HCAP coverage  Allergies  Allergen Reactions  . Bee Venom Other (See Comments)    Unknown  . Nyquil Cold & (Dm-Doxylamine-Acetaminophen) Other (See Comments)    Unknown   . Penicillins Other (See Comments)    unknown  . Ppd (Tuberculin Purified Protein Derivative) Other (See Comments)    unknown    Patient Measurements: Height: 5\' 3"  (160 cm) Weight: 306 lb 10.6 oz (139.1 kg) IBW/kg (Calculated) : 52.4  Vital Signs: Temp: 98.9 F (37.2 C) (05/10 1200) Temp src: Oral (05/10 1200) BP: 142/78 mmHg (05/10 1436) Pulse Rate: 90 (05/10 1436) Intake/Output from previous day: 05/09 0701 - 05/10 0700 In: 10 [I.V.:10] Out: 775 [Urine:775] Intake/Output from this shift:    Labs:  Recent Labs  11/08/12 0410 11/09/12 0435 11/10/12 0643  WBC 6.1  --   --   HGB 8.6*  --   --   PLT 185  --   --   CREATININE 1.10 0.86 0.85   Estimated Creatinine Clearance: 93.1 ml/min (by C-G formula based on Cr of 0.85).  Recent Labs  11/08/12 0010  VANCOTROUGH 31.8*     Microbiology: Recent Results (from the past 720 hour(s))  MRSA PCR SCREENING     Status: None   Collection Time    11/05/12 11:22 PM      Result Value Range Status   MRSA by PCR NEGATIVE  NEGATIVE Final   Comment:            The GeneXpert MRSA Assay (FDA     approved for NASAL specimens     only), is one component of a     comprehensive MRSA colonization     surveillance program. It is not     intended to diagnose MRSA     infection nor to guide or     monitor treatment for     MRSA infections.  URINE CULTURE     Status: None   Collection Time    11/06/12 12:41 AM      Result Value Range Status   Specimen Description URINE, CATHETERIZED   Final   Special Requests vancocin   Final   Culture  Setup Time 11/06/2012 00:56   Final   Colony Count NO GROWTH   Final   Culture NO GROWTH   Final   Report Status 11/07/2012  FINAL   Final    Anti-infectives   Start     Dose/Rate Route Frequency Ordered Stop   11/08/12 1800  vancomycin (VANCOCIN) IVPB 750 mg/150 ml premix  Status:  Discontinued     750 mg 150 mL/hr over 60 Minutes Intravenous Every 12 hours 11/08/12 0119 11/08/12 0952   11/06/12 1300  vancomycin (VANCOCIN) 1,500 mg in sodium chloride 0.9 % 500 mL IVPB  Status:  Discontinued     1,500 mg 250 mL/hr over 120 Minutes Intravenous Every 12 hours 11/06/12 0856 11/08/12 0119   11/06/12 0930  aztreonam (AZACTAM) 2 g in dextrose 5 % 50 mL IVPB  Status:  Discontinued     2 g 100 mL/hr over 30 Minutes Intravenous 3 times per day 11/06/12 0854 11/09/12 1116   11/05/12 2200  vancomycin (VANCOCIN) 2,500 mg in sodium chloride 0.9 % 500 mL IVPB     2,500 mg 250 mL/hr over 120 Minutes Intravenous  Once 11/05/12 2148 11/06/12 0155   11/05/12 2200  aztreonam (AZACTAM) 2 g in dextrose 5 % 50  mL IVPB     2 g 100 mL/hr over 30 Minutes Intravenous  Once 11/05/12 2148 11/05/12 2313      Assessment: 62 y.o. F with antibiotics de-escalated to Aztreonam alone on 5/8, then dced yesterday and now restarting aztreonam for a total of 7 days for continued r/o PNA coverage.  Vanc 5/5 >> 5/8 Azactam 5/5 >> 5/8, now restarting  Goal of Therapy:  Proper antibiotics for infection/cultures adjusted for renal/hepatic function   Plan:  Restart Aztreonam 2g IV every 8 hours for total of 7 days. Will continue to follow renal function, clinical status.    Wendie Simmer, PharmD, BCPS Clinical Pharmacist  Pager: [phone]

## 2012-11-10 NOTE — Progress Notes (Signed)
Patient being transferred to 4700 per MD order. Report called to Freida Busman, RN. Patient will transfer in bed.Family at bedside.

## 2012-11-10 NOTE — Progress Notes (Signed)
[facility] Progress Note [place] TEAM 1 - Stepdown/ICU TEAM   [name] ZOX:[id number] DOB: Aug 20, 1950 DOA: 11/05/2012 PCP: Karlene Einstein, MD  Brief narrative: This is a 62 y/o with h/o COPD, OSA, OHS on BiPAP (CPAP?) At bedtime female with lives in an nursing home and was sent to the hospital for confusion and found to have hypercarbic encephalopathy. She is noted to be on multiple sedating medications.  5/5 Hypercarbic, failed BiPAP, intubated (dificult intubation - large tongue, small oropharynx, anterior larynx, morbid obesity)  5/5 head CT>>>No acute intracranial abnormality. Stable appearance of chronic small vessel disease.   Assessment/Plan: Principal Problem:   Acute and chronic respiratory failure / COPD/ flash pulmonary edema/ Questionable HCAP - hypercarbic - intubated on 5/5 -5/6 patient self extubated and then reintubated  -5/8- extubated - cont Azactam for total 7 days- started on 5/5  Active Problems:   OBSTRUCTIVE SLEEP APNEA - cont CPAP at bedtime    Diabetes mellitus - cont sliding scale    Acute encephalopathy - resolved  Chronic Pain - hold Long acting Morphine and use PRN meds only  Bipolar disorder - hold clonazepam- pt receiving Buspar BID - follow carefully for over sedation  Morbid obesity  ANTIBIOTICS:  Vancomycin 5/5 >>>5/8  Aztreonam 5/5 >>>   Code Status: full code Family Communication: none Disposition Plan: transfer out of SDU  Consultants: NONE  DVT prophylaxis: Heparin  HPI/Subjective: Difficult to initially awaken but once awakened, quite alert. Not in any pain, no anxiety   Objective: Blood pressure 142/78, pulse 90, temperature 98.9 F (37.2 C), temperature source Oral, resp. rate 18, height 5\' 3"  (1.6 m), weight 139.1 kg (306 lb 10.6 oz), SpO2 99.00%.  Intake/Output Summary (Last 24 hours) at 11/10/12 1929 Last data filed at 11/10/12 1900  Gross per 24 hour  Intake    720 ml  Output    200 ml   Net    520 ml     Exam: General: No acute respiratory distress Lungs: Clear to auscultation bilaterally without wheezes or crackles Cardiovascular: Regular rate and rhythm without murmur gallop or rub normal S1 and S2 Abdomen: Nontender, nondistended, soft, bowel sounds positive, no rebound, no ascites, no appreciable mass Extremities: No significant cyanosis, clubbing, or edema bilateral lower extremities  Data Reviewed: Basic Metabolic Panel:  Recent Labs Lab 11/06/12 0550 11/07/12 0325 11/08/12 0410 11/09/12 0435 11/10/12 0643  NA 138 135 138 142 142  K 4.0 3.4* 3.2* 3.5 3.7  CL 86* 87* 90* 96 97  CO2 >45* 43* 39* 41* 42*  GLUCOSE 96 109* 106* 102* 115*  BUN 13 26* 33* 19 13  CREATININE 0.79 1.27* 1.10 0.86 0.85  CALCIUM 9.4 8.7 8.5 8.5 9.0  MG 1.5  --  1.9  --   --   PHOS 2.7  --  5.1*  --   --    Liver Function Tests:  Recent Labs Lab 11/05/12 1831 11/07/12 0325  AST 44* 35  ALT 19 12  ALKPHOS 80 66  BILITOT 0.2* 0.2*  PROT 8.3 6.7  ALBUMIN 3.4* 2.7*   No results found for this basename: LIPASE, AMYLASE,  in the last 168 hours No results found for this basename: AMMONIA,  in the last 168 hours CBC:  Recent Labs Lab 11/05/12 1845 11/06/12 0550 11/07/12 0325 11/08/12 0410  WBC  --  5.8 8.0 6.1  NEUTROABS  --   --  5.6 4.1  HGB 11.2* 9.0* 9.0* 8.6*  HCT 33.0* 28.3* 27.4*  26.2*  MCV  --  81.3 79.7 79.6  PLT  --  205 206 185   Cardiac Enzymes:  Recent Labs Lab 11/06/12 0335 11/06/12 0900 11/09/12 1816 11/10/12 0211 11/10/12 0643  TROPONINI <0.30 <0.30 <0.30 <0.30 <0.30   BNP (last 3 results)  Recent Labs  11/05/12 1800  PROBNP 332.1*   CBG:  Recent Labs Lab 11/09/12 1214 11/09/12 2116 11/10/12 0721 11/10/12 1205 11/10/12 1626  GLUCAP 117* 112* 107* 116* 131*    Recent Results (from the past 240 hour(s))  MRSA PCR SCREENING     Status: None   Collection Time    11/05/12 11:22 PM      Result Value Range Status   MRSA by  PCR NEGATIVE  NEGATIVE Final   Comment:            The GeneXpert MRSA Assay (FDA     approved for NASAL specimens     only), is one component of a     comprehensive MRSA colonization     surveillance program. It is not     intended to diagnose MRSA     infection nor to guide or     monitor treatment for     MRSA infections.  URINE CULTURE     Status: None   Collection Time    11/06/12 12:41 AM      Result Value Range Status   Specimen Description URINE, CATHETERIZED   Final   Special Requests vancocin   Final   Culture  Setup Time 11/06/2012 00:56   Final   Colony Count NO GROWTH   Final   Culture NO GROWTH   Final   Report Status 11/07/2012 FINAL   Final     Studies:  Recent x-ray studies have been reviewed in detail by the Attending Physician  Scheduled Meds:  Scheduled Meds: . albuterol  2.5 mg Nebulization Q4H  . amLODipine  5 mg Oral Daily  . ARIPiprazole  10 mg Oral Daily  . aspirin  325 mg Oral Daily  . budesonide-formoterol  2 puff Inhalation BID  . busPIRone  10 mg Oral BID  . cloNIDine  0.2 mg Oral BID  . docusate  100 mg Oral Daily  . DULoxetine  60 mg Oral BID  . fluticasone  2 spray Each Nare Daily  . gabapentin  400 mg Oral TID  . heparin subcutaneous  5,000 Units Subcutaneous Q8H  . insulin aspart  0-15 Units Subcutaneous TID AC & HS  . ipratropium  0.5 mg Nebulization Q4H  . pantoprazole  40 mg Oral Daily  . tiotropium  18 mcg Inhalation Daily  . Valproic Acid  500 mg Oral BID   Continuous Infusions: . sodium chloride Stopped (11/10/12 1702)    Time spent on care of this patient: 35 MD   Calvert Cantor, MD [phone]  [facility] Office  [phone] Pager - Text Page per Amion as per below:  On-Call/Text Page:      [email or web address]      password [email or web address]  If 7PM-7AM, please contact night-coverage [email or web address] Password [email or web address] 11/10/2012, 7:29 PM   LOS: 5 days

## 2012-11-10 NOTE — Progress Notes (Signed)
Pt refused neb tx at this time, stated she does not need

## 2012-11-11 DIAGNOSIS — I10 Essential (primary) hypertension: Secondary | ICD-10-CM

## 2012-11-11 LAB — GLUCOSE, CAPILLARY
Glucose-Capillary: 132 mg/dL — ABNORMAL HIGH (ref 70–99)
Glucose-Capillary: 121 mg/dL — ABNORMAL HIGH (ref 70–99)

## 2012-11-11 MED ORDER — SODIUM CHLORIDE 0.9 % IJ SOLN
3.0000 mL | Freq: Two times a day (BID) | INTRAMUSCULAR | Status: DC
  Administered 2012-11-11 – 2012-11-12 (×3): 3 mL via INTRAVENOUS

## 2012-11-11 MED ORDER — INSULIN ASPART 100 UNIT/ML ~~LOC~~ SOLN
0.0000 [IU] | Freq: Three times a day (TID) | SUBCUTANEOUS | Status: DC
  Administered 2012-11-12: 2 [IU] via SUBCUTANEOUS

## 2012-11-11 MED ORDER — IPRATROPIUM BROMIDE 0.02 % IN SOLN
0.5000 mg | Freq: Four times a day (QID) | RESPIRATORY_TRACT | Status: DC
  Administered 2012-11-11 – 2012-11-12 (×5): 0.5 mg via RESPIRATORY_TRACT
  Filled 2012-11-11 (×5): qty 2.5

## 2012-11-11 MED ORDER — ALBUTEROL SULFATE (5 MG/ML) 0.5% IN NEBU
2.5000 mg | INHALATION_SOLUTION | Freq: Four times a day (QID) | RESPIRATORY_TRACT | Status: DC
  Administered 2012-11-11 – 2012-11-12 (×5): 2.5 mg via RESPIRATORY_TRACT
  Filled 2012-11-11 (×5): qty 0.5

## 2012-11-11 NOTE — Progress Notes (Signed)
[facility] PROGRESS NOTE  [name] NWG:[id number] DOB: 08/12/1950 DOA: 11/05/2012 PCP: Karlene Einstein, MD   Brief narrative: 62 y/o morbidly obese female resident of NH with  COPD, OSA/ OHS on BiPAP (CPAP?)sent to the hospital for confusion and found to have hypercarbic encephalopathy. She is noted to be on multiple sedating medications. Patient was noted to be in hypercarbic respiratory failure, failed BiPAP required intubation. She was extubated next day , then re intubated. Started on broad spectrum antibiotics. Extubated on 5/8.   Assessment/Plan: Hypercarbic respiratory failure extubated and transferred to stepdown. Now stable on [place] Aztreonam  possible PNA ( day 6/7) Continue albuterol and atrovent nebs. continue symbicort  Encephalopathy   on admission   secondary to hypercarbic resp failure and multiple meds including narcotics . Now resolved  OSA  continue nightly CPAP  Chronic pain  on prn pain meds and holding long acting morphine Continue neurontin  Bipolar disorder Continue abilify and buspar. Hold clonazepam . continue depakote.   GERD  continue protonix  HTN: Continue amlodipine, clonidine,    DVT prophylaxis: sq heparin   Code Status: full Family Communication: none at bedside Disposition Plan: return to [facility]   Consultants:  PCCM  Procedures:  intubation  Antibiotics:  Aztreonam ( day 6/7)  HPI/Subjective: Patient seen and examined . Feels her SOB to be "just ok" . Denies chest pain  Objective: Filed Vitals:   11/11/12 0600 11/11/12 0934 11/11/12 1149 11/11/12 1353  BP: 145/56 114/73  142/68  Pulse: 89 96  103  Temp: 98.9 F (37.2 C)   98.9 F (37.2 C)  TempSrc: Oral   Oral  Resp: 22 21 22 20   Height:      Weight:      SpO2: 96% 98% 100% 100%    Intake/Output Summary (Last 24 hours) at 11/11/12 1640 Last data filed at 11/11/12 1404  Gross per 24 hour  Intake   1450 ml  Output    700 ml  Net    750 ml    Filed Weights   11/10/12 0500 11/10/12 1436 11/11/12 0500  Weight: 140.9 kg (310 lb 10.1 oz) 139.1 kg (306 lb 10.6 oz) 137.75 kg (303 lb 10.9 oz)    Exam:   General:  Middle aged morbidly obese female in NAD  HEENT: no pallor, moist oral mucosa  Chest: clear b/l, no added sounds  Cardiovascular: **NS1&S2, no murmurs  Abdomen: soft, obese , NT, ND, BS+  Ext: warm, no edema, left leg ulcer with dressing   CNS: AAOX3    Data Reviewed: Basic Metabolic Panel:  Recent Labs Lab 11/06/12 0550 11/07/12 0325 11/08/12 0410 11/09/12 0435 11/10/12 0643  NA 138 135 138 142 142  K 4.0 3.4* 3.2* 3.5 3.7  CL 86* 87* 90* 96 97  CO2 >45* 43* 39* 41* 42*  GLUCOSE 96 109* 106* 102* 115*  BUN 13 26* 33* 19 13  CREATININE 0.79 1.27* 1.10 0.86 0.85  CALCIUM 9.4 8.7 8.5 8.5 9.0  MG 1.5  --  1.9  --   --   PHOS 2.7  --  5.1*  --   --    Liver Function Tests:  Recent Labs Lab 11/05/12 1831 11/07/12 0325  AST 44* 35  ALT 19 12  ALKPHOS 80 66  BILITOT 0.2* 0.2*  PROT 8.3 6.7  ALBUMIN 3.4* 2.7*   No results found for this basename: LIPASE, AMYLASE,  in the last 168 hours No results found for this basename: AMMONIA,  in the last 168 hours CBC:  Recent Labs Lab 11/05/12 1845 11/06/12 0550 11/07/12 0325 11/08/12 0410  WBC  --  5.8 8.0 6.1  NEUTROABS  --   --  5.6 4.1  HGB 11.2* 9.0* 9.0* 8.6*  HCT 33.0* 28.3* 27.4* 26.2*  MCV  --  81.3 79.7 79.6  PLT  --  205 206 185   Cardiac Enzymes:  Recent Labs Lab 11/06/12 0335 11/06/12 0900 11/09/12 1816 11/10/12 0211 11/10/12 0643  TROPONINI <0.30 <0.30 <0.30 <0.30 <0.30   BNP (last 3 results)  Recent Labs  11/05/12 1800  PROBNP 332.1*   CBG:  Recent Labs Lab 11/10/12 0721 11/10/12 1205 11/10/12 1626 11/10/12 2105 11/11/12 1127  GLUCAP 107* 116* 131* 122* 132*    Recent Results (from the past 240 hour(s))  MRSA PCR SCREENING     Status: None   Collection Time    11/05/12 11:22 PM      Result Value  Range Status   MRSA by PCR NEGATIVE  NEGATIVE Final   Comment:            The GeneXpert MRSA Assay (FDA     approved for NASAL specimens     only), is one component of a     comprehensive MRSA colonization     surveillance program. It is not     intended to diagnose MRSA     infection nor to guide or     monitor treatment for     MRSA infections.  URINE CULTURE     Status: None   Collection Time    11/06/12 12:41 AM      Result Value Range Status   Specimen Description URINE, CATHETERIZED   Final   Special Requests vancocin   Final   Culture  Setup Time 11/06/2012 00:56   Final   Colony Count NO GROWTH   Final   Culture NO GROWTH   Final   Report Status 11/07/2012 FINAL   Final     Studies: Dg Chest Port 1 View  11/09/2012  *RADIOLOGY REPORT*  Clinical Data: Respiratory distress, history COPD, hypertension, obesity, coronary artery disease, MI, asthma  PORTABLE CHEST - 1 VIEW  Comparison: Portable exam 1912 hours compared to 11/08/2012  Findings: Enlargement of cardiac silhouette with slight pulmonary vascular congestion. Tortuous thoracic aorta. Interval removal of nasogastric tube, endotracheal tube, and left jugular line. Persistent right basilar opacity question atelectasis versus consolidation. Minimal atelectasis at left base. Upper lungs clear. No pneumothorax.  IMPRESSION: No change in appearance of lungs since previous study.   Original Report Authenticated By: Ulyses Southward, M.D.     Scheduled Meds: . albuterol  2.5 mg Nebulization Q6H  . amLODipine  5 mg Oral Daily  . ARIPiprazole  10 mg Oral Daily  . aspirin  325 mg Oral Daily  . aztreonam  2 g Intravenous Q8H  . budesonide-formoterol  2 puff Inhalation BID  . busPIRone  10 mg Oral BID  . cloNIDine  0.2 mg Oral BID  . docusate  100 mg Oral Daily  . DULoxetine  60 mg Oral BID  . fluticasone  2 spray Each Nare Daily  . gabapentin  400 mg Oral TID  . heparin subcutaneous  5,000 Units Subcutaneous Q8H  . insulin aspart   0-15 Units Subcutaneous TID AC & HS  . ipratropium  0.5 mg Nebulization Q6H  . pantoprazole  40 mg Oral Daily  . sodium chloride  3 mL Intravenous Q12H  .  tiotropium  18 mcg Inhalation Daily  . Valproic Acid  500 mg Oral BID   Continuous Infusions: . sodium chloride Stopped (11/10/12 1702)       Time spent: 25 MINUTES   Bentley Haralson  [facility] Pager [phone]. If 7PM-7AM, please contact night-coverage at [email or web address], password [facility] 11/11/2012, 4:40 PM  LOS: 6 days

## 2012-11-11 NOTE — Progress Notes (Signed)
Patient refused CPAP for tonuight. Was explained to her that if she changed her mind i would be on for all night and she could just have them call and we would set her up.

## 2012-11-11 NOTE — Progress Notes (Signed)
MD; Pt stated having some abdominal pain for the last two days. Pt also concern about her abdomen being red. Pt had a BM 5-11 and has active bowel sounds. Will continue to monitor

## 2012-11-12 DIAGNOSIS — J189 Pneumonia, unspecified organism: Secondary | ICD-10-CM

## 2012-11-12 NOTE — Discharge Summary (Signed)
Physician Discharge Summary  [name] ZOX:[id number] DOB: 07/09/1950 DOA: 11/05/2012  PCP: Karlene Einstein, MD  Admit date: 11/05/2012 Discharge date: 11/12/2012  Time spent: 40 minutes  Recommendations for Outpatient Follow-up:  Discharge back to SNF  Discharge Diagnoses:   Principal Problem:   Acute and chronic respiratory failure (acute-on-chronic)  Active Problems: Possible HCAP   Morbid obesity   BIPOLAR DISORDER UNSPECIFIED   OBSTRUCTIVE SLEEP APNEA   C O P D   Diabetes mellitus   Acute encephalopathy HTN    Discharge Condition: FAIR  Diet recommendation: cardiac  Filed Weights   11/10/12 1436 11/11/12 0500 11/12/12 0500  Weight: 139.1 kg (306 lb 10.6 oz) 137.75 kg (303 lb 10.9 oz) 137.1 kg (302 lb 4 oz)    History of present illness:  Please refer to admission h&P for details, but in brief, 62 y/o morbidly obese female resident of NH with COPD, OSA/ OHS on BiPAP (CPAP?)sent to the hospital for confusion and found to have hypercarbic encephalopathy. She is noted to be on multiple sedating medications. Patient was noted to be in hypercarbic respiratory failure, failed BiPAP required intubation. She was extubated next day , then re intubated. Started on broad spectrum antibiotics. Extubated on 5/8.  Patient transferred to telemetry.   Hospital Course:  Hypercarbic respiratory failure  extubated and transferred to stepdown. Now stable on nasal canula Aztreonam for possible PNA . completes 7 day course today.  Continue albuterol and atrovent nebs. continue symbicort   Encephalopathy  on admission  secondary to hypercarbic resp failure and multiple meds including narcotics . Now resolved  Discontinued long acting morphine and placed only on oxycodone IR  OSA  continue nightly CPAP . Patient has been refusing   Chronic pain  on prn pain meds and holding long acting morphine  Continue neurontin   Bipolar disorder  Continue abilify and buspar. Hold  clonazepam . continue depakote.   GERD  continue protonix   HTN:  Continue amlodipine, clonidine   Code Status: full   Disposition Plan:  return to NH .   Consultants:  PCCM Procedures:  Intubation Antibiotics:  Aztreonam day 7/7       Discharge Exam: Filed Vitals:   11/12/12 0500 11/12/12 0849 11/12/12 1114 11/12/12 1426  BP: 136/65  125/66 116/43  Pulse: 90  93 88  Temp: 98.1 F (36.7 C)   98.6 F (37 C)  TempSrc: Oral   Oral  Resp: 22   21  Height:      Weight: 137.1 kg (302 lb 4 oz)     SpO2: 100% 99% 100% 100%    General: Middle aged morbidly obese female in NAD  HEENT: no pallor, moist oral mucosa Chest: clear b/l, no added sounds  Cardiovascular: **NS1&S2, no murmurs  Abdomen: soft, obese , NT, ND, BS+  Ext: warm, chronic venous stasis changes  CNS: AAOX3   Discharge Instructions     Medication List    STOP taking these medications       clonazePAM 0.5 MG tablet  Commonly known as:  KLONOPIN     morphine 15 MG 12 hr tablet  Commonly known as:  MS CONTIN     predniSONE 50 MG tablet  Commonly known as:  DELTASONE     zolpidem 5 MG tablet  Commonly known as:  AMBIEN      TAKE these medications       albuterol (2.5 MG/3ML) 0.083% nebulizer solution  Commonly known as:  PROVENTIL  Take 2.5  mg by nebulization every 6 (six) hours as needed for wheezing.     amLODipine 5 MG tablet  Commonly known as:  NORVASC  Take 5 mg by mouth daily.     ARIPiprazole 10 MG tablet  Commonly known as:  ABILIFY  Take 10 mg by mouth daily. For depressive psychosis     ascorbic acid 500 MG tablet  Commonly known as:  VITAMIN C  Take 500 mg by mouth 2 (two) times daily.     aspirin 325 MG tablet  Take 325 mg by mouth daily.     budesonide-formoterol 160-4.5 MCG/ACT inhaler  Commonly known as:  SYMBICORT  Inhale 2 puffs into the lungs 2 (two) times daily.     busPIRone 10 MG tablet  Commonly known as:  BUSPAR  Take 10 mg by mouth 2 (two) times  daily.     calcium-vitamin D 500-200 MG-UNIT per tablet  Commonly known as:  OSCAL WITH D  Take 1 tablet by mouth 2 (two) times daily.     CERTAGEN PO  Take 1 tablet by mouth daily.     cloNIDine 0.2 MG tablet  Commonly known as:  CATAPRES  Take 0.2 mg by mouth 2 (two) times daily.     divalproex 500 MG 24 hr tablet  Commonly known as:  DEPAKOTE ER  Take 500 mg by mouth 2 (two) times daily.     docusate sodium 100 MG capsule  Commonly known as:  COLACE  Take 100 mg by mouth daily.     DULoxetine 60 MG capsule  Commonly known as:  CYMBALTA  Take 60 mg by mouth 2 (two) times daily.     feeding supplement Liqd  Take 30 mLs by mouth 2 (two) times daily.     fluticasone 50 MCG/ACT nasal spray  Commonly known as:  FLONASE  Place 2 sprays into the nose daily.     folic acid 1 MG tablet  Commonly known as:  FOLVITE  Take 1 mg by mouth daily.     furosemide 40 MG tablet  Commonly known as:  LASIX  Take 40 mg by mouth daily.     gabapentin 400 MG capsule  Commonly known as:  NEURONTIN  Take 400 mg by mouth 3 (three) times daily.     isosorbide mononitrate 30 MG 24 hr tablet  Commonly known as:  IMDUR  Take 30 mg by mouth daily.     lisinopril 10 MG tablet  Commonly known as:  PRINIVIL,ZESTRIL  Take 10 mg by mouth daily.     metoCLOPramide 5 MG tablet  Commonly known as:  REGLAN  Take 5 mg by mouth 3 (three) times daily.     nitroGLYCERIN 0.4 MG SL tablet  Commonly known as:  NITROSTAT  Place 0.4 mg under the tongue every 5 (five) minutes as needed for chest pain.     oxyCODONE-acetaminophen 5-325 MG per tablet  Commonly known as:  PERCOCET/ROXICET  Take 1 tablet by mouth every 4 (four) hours as needed. For pain     pantoprazole 40 MG tablet  Commonly known as:  PROTONIX  Take 40 mg by mouth 2 (two) times daily.     PATADAY 0.2 % Soln  Generic drug:  Olopatadine HCl  Place 1 drop into both eyes daily.     senna 8.6 MG Tabs  Commonly known as:  SENOKOT   Take 2 tablets by mouth daily.     simethicone 80 MG chewable tablet  Commonly  known as:  MYLICON  Chew 80 mg by mouth every 8 (eight) hours.     SYSTANE BALANCE OP  Place 1 drop into both eyes 2 (two) times daily.     tiotropium 18 MCG inhalation capsule  Commonly known as:  SPIRIVA  Place 18 mcg into inhaler and inhale daily.     vitamin D (CHOLECALCIFEROL) 400 UNITS tablet  Take 400 Units by mouth daily.       Allergies  Allergen Reactions  . Bee Venom Other (See Comments)    Unknown  . Nyquil Cold & (Dm-Doxylamine-Acetaminophen) Other (See Comments)    Unknown   . Penicillins Other (See Comments)    unknown  . Ppd (Tuberculin Purified Protein Derivative) Other (See Comments)    unknown       Follow-up Information   Follow up with [facility], [place]. (will be followed up at [facility])    Contact information:   [address] [place] [address] [phone]        The results of significant diagnostics from this hospitalization (including imaging, microbiology, ancillary and laboratory) are listed below for reference.    Significant Diagnostic Studies: Ct Head Wo Contrast  11/05/2012  *RADIOLOGY REPORT*  Clinical Data: 62 year old female with acute encephalopathy. Altered mental status.  CT HEAD WITHOUT CONTRAST  Technique:  Contiguous axial images were obtained from the base of the skull through the vertex without contrast.  Comparison: 10/15/2011.  Findings: Large body habitus.  No acute scalp or orbit soft tissue findings.  Retained secretions in the nasopharynx. Visualized paranasal sinuses and mastoids are clear.  No acute osseous abnormality identified.  Chronic lacunar infarct right caudate nucleus is stable.  Patchy cerebral white matter hypodensity is not significantly changed. Cerebral volume remains normal.  No ventriculomegaly. No midline shift, mass effect, or evidence of mass lesion.  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 No acute intracranial  hemorrhage identified. No evidence of cortically based acute infarction identified.  No suspicious intracranial vascular hyperdensity.  IMPRESSION: No acute intracranial abnormality.  Stable appearance of chronic small vessel disease.   Original Report Authenticated By: Erskine Speed, M.D.    Dg Chest Port 1 View  11/09/2012  *RADIOLOGY REPORT*  Clinical Data: Respiratory distress, history COPD, hypertension, obesity, coronary artery disease, MI, asthma  PORTABLE CHEST - 1 VIEW  Comparison: Portable exam 1912 hours compared to 11/08/2012  Findings: Enlargement of cardiac silhouette with slight pulmonary vascular congestion. Tortuous thoracic aorta. Interval removal of nasogastric tube, endotracheal tube, and left jugular line. Persistent right basilar opacity question atelectasis versus consolidation. Minimal atelectasis at left base. Upper lungs clear. No pneumothorax.  IMPRESSION: No change in appearance of lungs since previous study.   Original Report Authenticated By: Ulyses Southward, M.D.    Dg Chest Port 1 View  11/08/2012  *RADIOLOGY REPORT*  Clinical Data: 62 year old female with acute and chronic respiratory failure.  PORTABLE CHEST - 1 VIEW  Comparison: 11/07/2012 and earlier.  Findings: AP portable semi upright view at 0446 hours.  Stable endotracheal tube tip.  Enteric tube courses to the left abdomen as before, tip not included.  Stable left IJ central line, tip in the midline.  Stable lung volumes. Stable cardiomegaly and mediastinal contours.  No pneumothorax.  No large pleural effusion.  Bibasilar pulmonary opacity is not significantly changed and in part relate to elevation of the diaphragm.  No areas of worsening ventilation.  IMPRESSION:  1. Stable lines and tubes. 2.  No significant change in ventilation since  11/05/2012.   Original Report Authenticated By: Erskine Speed, M.D.    Dg Chest Port 1 View  11/07/2012  *RADIOLOGY REPORT*  Clinical Data: Left IJ central line placement.  PORTABLE CHEST - 1  VIEW  Comparison: 11/07/2012  Findings: Left jugular central line has been placed.  The tip of the catheter is near the junction of the left innominate vein and SVC.  Endotracheal tube is approximate 3.1 cm above the carina. There are very low lung volumes.  No evidence for a pneumothorax. Stable appearance of the heart and mediastinum.  Cannot exclude left basilar densities.  Nasogastric tube is in the upper abdomen.  IMPRESSION: Left jugular central line tip is near the junction of the left innominate vein and SVC.  Negative for a pneumothorax.  Low lung volumes.   Original Report Authenticated By: Richarda Overlie, M.D.    Dg Chest Port 1 View  11/07/2012  *RADIOLOGY REPORT*  Clinical Data: Evaluate endotracheal tube positioning  PORTABLE CHEST - 1 VIEW  Comparison: 11/06/2012; 11/05/2012  Findings: Grossly unchanged cardiac silhouette and mediastinal contours given persistently reduced lung volumes and patient rotation.  Stable positioning of support apparatus.  The pulmonary vasculature remains indistinct.  Suspected worsening of perihilar and bibasilar heterogeneous / consolidative opacities.  Small/trace bilateral effusions appear unchanged.  No definite pneumothorax. Unchanged bones.  IMPRESSION: 1.  Stable positioning of support apparatus.  No pneumothorax. 2.  Suspected minimal worsening of pulmonary edema and perihilar/bibasilar atelectasis.   Original Report Authenticated By: Tacey Ruiz, MD    Dg Chest Port 1 View  11/06/2012  *RADIOLOGY REPORT*  Clinical Data: Intubated, respiratory failure.  PORTABLE CHEST - 1 VIEW  Comparison: Earlier film of the same day  Findings: Endotracheal tube and nasogastric tube remain in place. Low lung volumes with patchy perihilar and bibasilar atelectasis or infiltrates, slightly improved since previous exam.  No definite effusion.  Mild cardiomegaly.  IMPRESSION: 1.  Low volumes with some improvement in perihilar and bibasilar atelectasis/infiltrates. 2. Support hardware  stable in position.   Original Report Authenticated By: D. Andria Rhein, MD    Dg Chest Port 1 View  11/06/2012  *RADIOLOGY REPORT*  Clinical Data: 62 year old female intubated.  Shortness of breath.  PORTABLE CHEST - 1 VIEW  Comparison: 11/05/2012 and earlier.  Findings: Portable semi upright AP view 0308 hours.  Endotracheal tube tip stable just below the level of the clavicles.  Stable enteric tube, tip not included.  Bilateral veiling pulmonary opacity mildly greater on the right. Stable cardiomegaly and mediastinal contours.  No pneumothorax.  No areas of worsening ventilation identified.  IMPRESSION:  1. Stable lines and tubes. 2.  Stable ventilation with bilateral pleural effusions and lung base atelectasis or consolidation.   Original Report Authenticated By: Erskine Speed, M.D.    Dg Chest Port 1 View  11/05/2012  *RADIOLOGY REPORT*  Clinical Data: Evaluate endotracheal tube  PORTABLE CHEST - 1 VIEW  Comparison: Earlier same day; 09/01/2011  Findings:  Grossly unchanged enlarged cardiac silhouette and mediastinal contours.  Interval intubation with endotracheal tube overlying the tracheal air column, tip approximately 2.9 cm above the carina. Interval placement of enteric tube with tip and side port projecting below the left hemidiaphragm.  Pulmonary vasculature remains indistinct with cephalization of flow.  Grossly unchanged bilateral mid and lower lung heterogeneous air space opacities, right likely greater than left.  Grossly unchanged small bilateral effusions.  Unchanged bones.  IMPRESSION: 1.  Appropriately positioned support apparatus as above.  No  pneumothorax. 2.  Persistent finding suggestive of pulmonary edema and bilateral heterogeneous air space opacities, atelectasis versus infiltrate.  3. Suspected small bilateral effusions.   Original Report Authenticated By: Tacey Ruiz, MD    Dg Chest Portable 1 View  11/05/2012  *RADIOLOGY REPORT*  Clinical Data: Respiratory distress, choked on  candy, decreased level of consciousness  PORTABLE CHEST - 1 VIEW  Comparison: 09/01/2011; 09/16/2010  Findings:  The examination is degraded secondary to portable technique and patient body habitus.  Grossly unchanged enlarged cardiac silhouette and mediastinal contours.  The pulmonary vasculature is less distinct on the present examination with cephalization of flow.  There is chronic mild elevation of the right hemidiaphragm.  Worsening bibasilar heterogeneous opacities.  Suspected trace bilateral effusions.  No definite pneumothorax.  Unchanged bones.  IMPRESSION: Degraded examination with findings suggestive of pulmonary edema and bibasilar opacities, right greater than left, atelectasis versus infiltrate.   Original Report Authenticated By: Tacey Ruiz, MD     Microbiology: Recent Results (from the past 240 hour(s))  MRSA PCR SCREENING     Status: None   Collection Time    11/05/12 11:22 PM      Result Value Range Status   MRSA by PCR NEGATIVE  NEGATIVE Final   Comment:            The GeneXpert MRSA Assay (FDA     approved for NASAL specimens     only), is one component of a     comprehensive MRSA colonization     surveillance program. It is not     intended to diagnose MRSA     infection nor to guide or     monitor treatment for     MRSA infections.  URINE CULTURE     Status: None   Collection Time    11/06/12 12:41 AM      Result Value Range Status   Specimen Description URINE, CATHETERIZED   Final   Special Requests vancocin   Final   Culture  Setup Time 11/06/2012 00:56   Final   Colony Count NO GROWTH   Final   Culture NO GROWTH   Final   Report Status 11/07/2012 FINAL   Final     Labs: Basic Metabolic Panel:  Recent Labs Lab 11/06/12 0550 11/07/12 0325 11/08/12 0410 11/09/12 0435 11/10/12 0643  NA 138 135 138 142 142  K 4.0 3.4* 3.2* 3.5 3.7  CL 86* 87* 90* 96 97  CO2 >45* 43* 39* 41* 42*  GLUCOSE 96 109* 106* 102* 115*  BUN 13 26* 33* 19 13  CREATININE  0.79 1.27* 1.10 0.86 0.85  CALCIUM 9.4 8.7 8.5 8.5 9.0  MG 1.5  --  1.9  --   --   PHOS 2.7  --  5.1*  --   --    Liver Function Tests:  Recent Labs Lab 11/05/12 1831 11/07/12 0325  AST 44* 35  ALT 19 12  ALKPHOS 80 66  BILITOT 0.2* 0.2*  PROT 8.3 6.7  ALBUMIN 3.4* 2.7*   No results found for this basename: LIPASE, AMYLASE,  in the last 168 hours No results found for this basename: AMMONIA,  in the last 168 hours CBC:  Recent Labs Lab 11/05/12 1845 11/06/12 0550 11/07/12 0325 11/08/12 0410  WBC  --  5.8 8.0 6.1  NEUTROABS  --   --  5.6 4.1  HGB 11.2* 9.0* 9.0* 8.6*  HCT 33.0* 28.3* 27.4* 26.2*  MCV  --  81.3 79.7 79.6  PLT  --  205 206 185   Cardiac Enzymes:  Recent Labs Lab 11/06/12 0335 11/06/12 0900 11/09/12 1816 11/10/12 0211 11/10/12 0643  TROPONINI <0.30 <0.30 <0.30 <0.30 <0.30   BNP: BNP (last 3 results)  Recent Labs  11/05/12 1800  PROBNP 332.1*   CBG:  Recent Labs Lab 11/11/12 1127 11/11/12 1639 11/11/12 2052 11/12/12 0607 11/12/12 1136  GLUCAP 132* 121* 145* 128* 146*       Signed:  Bright Spielmann  [facility] 11/12/2012, 3:11 PM

## 2012-11-12 NOTE — Progress Notes (Signed)
[facility] PROGRESS NOTE  [name]:[id number] DOB: [birth year] DOA: 11/05/2012 PCP: Karlene Einstein, MD  Brief narrative:  62 y/o morbidly obese female resident of NH with COPD, OSA/ OHS on BiPAP (CPAP?)sent to the hospital for confusion and found to have hypercarbic encephalopathy. She is noted to be on multiple sedating medications. Patient was noted to be in hypercarbic respiratory failure, failed BiPAP required intubation. She was extubated next day , then re intubated. Started on broad spectrum antibiotics. Extubated on 5/8.  Patient transferred to telemetry.   Assessment/Plan:  Hypercarbic respiratory failure  extubated and transferred to stepdown. Now stable on [place]  Aztreonam for possible PNA . completes 7 day course today. Continue albuterol and atrovent nebs. continue symbicort   Encephalopathy  on admission  secondary to hypercarbic resp failure and multiple meds including narcotics . Now resolved   OSA  continue nightly CPAP . Patient has been refusing  Chronic pain  on prn pain meds and holding long acting morphine  Continue neurontin   Bipolar disorder  Continue abilify and buspar. Hold clonazepam . continue depakote.   GERD  continue protonix   HTN:  Continue amlodipine, clonidine   DVT prophylaxis: sq heparin   Code Status: full   Family Communication: none at bedside   Disposition Plan: planned for return to NH . Patient refuses to go back t SNF and wants to go and live with her daughter. Will d/c in am with HH if stable.  Consultants:  PCCM   Procedures:  Intubation   Antibiotics:  Aztreonam day 7/7   HPI/Subjective: Patient seen an examined this am. denies any specific symptoms  Objective: Filed Vitals:   11/12/12 0113 11/12/12 0500 11/12/12 0849 11/12/12 1114  BP:  136/65  125/66  Pulse:  90  93  Temp:  98.1 F (36.7 C)    TempSrc:  Oral    Resp:  22    Height:      Weight:  137.1 kg (302 lb 4 oz)    SpO2: 99%  100% 99% 100%    Intake/Output Summary (Last 24 hours) at 11/12/12 1323 Last data filed at 11/12/12 1156  Gross per 24 hour  Intake   1370 ml  Output    950 ml  Net    420 ml   Filed Weights   11/10/12 1436 11/11/12 0500 11/12/12 0500  Weight: 139.1 kg (306 lb 10.6 oz) 137.75 kg (303 lb 10.9 oz) 137.1 kg (302 lb 4 oz)    Exam:  General: Middle aged morbidly obese female in NAD  HEENT: no pallor, moist oral mucosa Chest: clear b/l, no added sounds  Cardiovascular: **NS1&S2, no murmurs  Abdomen: soft, obese , NT, ND, BS+  Ext: warm, no edema, left leg ulcer with dressing  CNS: AAOX3   Data Reviewed: Basic Metabolic Panel:  Recent Labs Lab 11/06/12 0550 11/07/12 0325 11/08/12 0410 11/09/12 0435 11/10/12 0643  NA 138 135 138 142 142  K 4.0 3.4* 3.2* 3.5 3.7  CL 86* 87* 90* 96 97  CO2 >45* 43* 39* 41* 42*  GLUCOSE 96 109* 106* 102* 115*  BUN 13 26* 33* 19 13  CREATININE 0.79 1.27* 1.10 0.86 0.85  CALCIUM 9.4 8.7 8.5 8.5 9.0  MG 1.5  --  1.9  --   --   PHOS 2.7  --  5.1*  --   --    Liver Function Tests:  Recent Labs Lab 11/05/12 1831 11/07/12 0325  AST 44* 35  ALT  19 12  ALKPHOS 80 66  BILITOT 0.2* 0.2*  PROT 8.3 6.7  ALBUMIN 3.4* 2.7*   No results found for this basename: LIPASE, AMYLASE,  in the last 168 hours No results found for this basename: AMMONIA,  in the last 168 hours CBC:  Recent Labs Lab 11/05/12 1845 11/06/12 0550 11/07/12 0325 11/08/12 0410  WBC  --  5.8 8.0 6.1  NEUTROABS  --   --  5.6 4.1  HGB 11.2* 9.0* 9.0* 8.6*  HCT 33.0* 28.3* 27.4* 26.2*  MCV  --  81.3 79.7 79.6  PLT  --  205 206 185   Cardiac Enzymes:  Recent Labs Lab 11/06/12 0335 11/06/12 0900 11/09/12 1816 11/10/12 0211 11/10/12 0643  TROPONINI <0.30 <0.30 <0.30 <0.30 <0.30   BNP (last 3 results)  Recent Labs  11/05/12 1800  PROBNP 332.1*   CBG:  Recent Labs Lab 11/11/12 1127 11/11/12 1639 11/11/12 2052 11/12/12 0607 11/12/12 1136  GLUCAP 132*  121* 145* 128* 146*    Recent Results (from the past 240 hour(s))  MRSA PCR SCREENING     Status: None   Collection Time    11/05/12 11:22 PM      Result Value Range Status   MRSA by PCR NEGATIVE  NEGATIVE Final   Comment:            The GeneXpert MRSA Assay (FDA     approved for NASAL specimens     only), is one component of a     comprehensive MRSA colonization     surveillance program. It is not     intended to diagnose MRSA     infection nor to guide or     monitor treatment for     MRSA infections.  URINE CULTURE     Status: None   Collection Time    11/06/12 12:41 AM      Result Value Range Status   Specimen Description URINE, CATHETERIZED   Final   Special Requests vancocin   Final   Culture  Setup Time 11/06/2012 00:56   Final   Colony Count NO GROWTH   Final   Culture NO GROWTH   Final   Report Status 11/07/2012 FINAL   Final     Studies: No results found.  Scheduled Meds: . albuterol  2.5 mg Nebulization Q6H  . amLODipine  5 mg Oral Daily  . ARIPiprazole  10 mg Oral Daily  . aspirin  325 mg Oral Daily  . aztreonam  2 g Intravenous Q8H  . budesonide-formoterol  2 puff Inhalation BID  . busPIRone  10 mg Oral BID  . cloNIDine  0.2 mg Oral BID  . docusate  100 mg Oral Daily  . DULoxetine  60 mg Oral BID  . fluticasone  2 spray Each Nare Daily  . gabapentin  400 mg Oral TID  . heparin subcutaneous  5,000 Units Subcutaneous Q8H  . insulin aspart  0-15 Units Subcutaneous TID WC  . ipratropium  0.5 mg Nebulization Q6H  . pantoprazole  40 mg Oral Daily  . sodium chloride  3 mL Intravenous Q12H  . Valproic Acid  500 mg Oral BID   Continuous Infusions: . sodium chloride Stopped (11/10/12 1702)      Time spent: 25 minutes    Karl Knarr  [facility] Pager [phone] If 7PM-7AM, please contact night-coverage at [email or web address], password [facility] 11/12/2012, 1:23 PM  LOS: 7 days

## 2012-11-12 NOTE — Clinical Social Work Note (Signed)
2:12pm- CSW left message for Aurea Graff, pt daughter, reminding daughter that CSW would need an answer re: choice of [facility] or home.  CSW awaiting a return call.  CSW spoke to [place], pt brother, who is caretaker.  Rocky Link stated he would rather pt not return back to [facility].  CSW informed Rocky Link that [facility] was the only facility extending offers to the pt at this time. CS[place], brother refused this offer.    CSW advised Rocky Link that a decision would need to be made this afternoon regarding placement for pt.  CSW shared with Rocky Link that pt was medically stable and ready to be d/c pending placement.  Rocky Link acknowledged understanding and agreed to call CSW back with decision [facility] vs. Home with 24-hr supervision/care.  Vickii Penna, LCSWA [phone]  Clinical Social Work

## 2012-11-12 NOTE — Progress Notes (Signed)
1640 Report given to Eliah Marquard , nurse  [facility] . Transported  [employer]

## 2012-11-14 ENCOUNTER — Other Ambulatory Visit: Payer: Self-pay | Admitting: Geriatric Medicine

## 2012-11-14 MED ORDER — OXYCODONE-ACETAMINOPHEN 5-325 MG PO TABS: 1.0000 | ORAL_TABLET | ORAL | Status: DC | PRN

## 2012-11-17 ENCOUNTER — Non-Acute Institutional Stay (SKILLED_NURSING_FACILITY): Payer: Medicaid Other | Admitting: Internal Medicine

## 2012-11-17 DIAGNOSIS — IMO0001 Reserved for inherently not codable concepts without codable children: Secondary | ICD-10-CM

## 2012-11-17 DIAGNOSIS — I83009 Varicose veins of unspecified lower extremity with ulcer of unspecified site: Secondary | ICD-10-CM

## 2012-11-19 ENCOUNTER — Non-Acute Institutional Stay (SKILLED_NURSING_FACILITY): Payer: Medicaid Other | Admitting: Internal Medicine

## 2012-11-19 DIAGNOSIS — E1149 Type 2 diabetes mellitus with other diabetic neurological complication: Secondary | ICD-10-CM

## 2012-11-19 DIAGNOSIS — J449 Chronic obstructive pulmonary disease, unspecified: Secondary | ICD-10-CM

## 2012-11-19 DIAGNOSIS — J4489 Other specified chronic obstructive pulmonary disease: Secondary | ICD-10-CM

## 2012-11-19 DIAGNOSIS — G4733 Obstructive sleep apnea (adult) (pediatric): Secondary | ICD-10-CM

## 2012-11-19 DIAGNOSIS — I10 Essential (primary) hypertension: Secondary | ICD-10-CM

## 2012-11-23 NOTE — Progress Notes (Signed)
Patient ID: [name], female   DOB: 12/20/1950, 62 y.o.   MRN: [id number]           PROGRESS NOTE  DATE:  11/16/2012    FACILITY: [id number]  LEVEL OF CARE:   SNF   Acute Visit   CHIEF COMPLAINT:  Review of left leg wound.    HISTORY OF PRESENT ILLNESS:  Mrs. Bolte is a longstanding resident of this facility.  Recently in hospital from 11/05/2012 through 11/12/2012 with healthcare-acquired pneumonia.  She spent some time in the ICU, apparently.  She was also felt to have hypercarbic encephalopathy, possibly secondary to pain medications.  Of course as she was kept recumbent in the hospital, the edema in her legs improved and so did the condition of her chronic stasis ulcer in the left leg.    PHYSICAL EXAMINATION:   SKIN:  INSPECTION:  Left leg:  Very clean, granulated stasis ulcer.  No evidence of infection.  She, of course, has severe bilateral venous stasis with probable secondary lymphedema.    ASSESSMENT/PLAN:  Venous stasis ulceration as described above.  We will use collagen/Hydrogel and a bulky dressing under a Kerlix/Coban wrap.  This can be changed three times a week.  I would suggest a lubricating skin ointment to her legs for the dry skin.  This can be leg balm, Cetaphil, Eucerin, etc.    CPT CODE: 84166

## 2012-12-10 ENCOUNTER — Non-Acute Institutional Stay (SKILLED_NURSING_FACILITY): Payer: Medicaid Other | Admitting: Internal Medicine

## 2012-12-10 DIAGNOSIS — D638 Anemia in other chronic diseases classified elsewhere: Secondary | ICD-10-CM

## 2012-12-10 DIAGNOSIS — J449 Chronic obstructive pulmonary disease, unspecified: Secondary | ICD-10-CM

## 2012-12-10 DIAGNOSIS — I10 Essential (primary) hypertension: Secondary | ICD-10-CM

## 2012-12-10 DIAGNOSIS — E1149 Type 2 diabetes mellitus with other diabetic neurological complication: Secondary | ICD-10-CM

## 2012-12-10 NOTE — Progress Notes (Signed)
Patient ID: [name], female   DOB: 1950/10/04, 62 y.o.   MRN: [id number]        HISTORY & PHYSICAL  DATE: 11/19/2012   FACILITY: [facility]  LEVEL OF CARE: SNF (31)  ALLERGIES:  Allergies  Allergen Reactions  . Bee Venom Other (See Comments)    Unknown  . Nyquil Cold & (Dm-Doxylamine-Acetaminophen) Other (See Comments)    Unknown   . Penicillins Other (See Comments)    unknown  . Ppd (Tuberculin Purified Protein Derivative) Other (See Comments)    unknown    CHIEF COMPLAINT:  Manage COPD, diabetes mellitus, and hypertension.    HISTORY OF PRESENT ILLNESS:  The patient is a 62 year-old, African-American female who was hospitalized secondary to hypercarbic respiratory failure.  After hospitalization, she is readmitted back to the facility for long-term care management.    COPD: the COPD remains stable.  Pt denies sob, cough, wheezing or declining exercise tolerance.  No complications from the medications presently being used.  Patient is oxygen dependent.   HTN: Pt 's HTN remains stable.  Denies CP, sob, DOE, pedal edema, headaches, dizziness or visual disturbances.  No complications from the medications currently being used.  Last BP : 116/43, 125/66, 136/65.  DM:pt's DM remains stable.  Pt denies polyuria, polydipsia, polyphagia, changes in vision or hypoglycemic episodes.  No complications noted from the medication presently being used.  Last hemoglobin A1c is: A recent  hemoglobin A1C is not available.   PAST MEDICAL HISTORY :  Past Medical History  Diagnosis Date  . COPD   . Hypertension   . Chronic respiratory failure     3 L oxygen  . GERD (gastroesophageal reflux disease)   . Diabetes mellitus   . Obesity, Class III, BMI 40-49.9 (morbid obesity)   . Obstructive sleep apnea on CPAP   . Peripheral vascular disease   . Bipolar disease, chronic   . Lymphedema   . COPD (chronic obstructive pulmonary disease)   . Cellulitis   . Coronary  artery disease   . Asthma   . Myocardial infarction   . Vertigo     PAST SURGICAL HISTORY: Past Surgical History  Procedure Laterality Date  . Abdominal hysterectomy    . Cholecystectomy    . Appendectomy    . Tubal ligation      SOCIAL HISTORY:  reports that she has quit smoking. She does not have any smokeless tobacco history on file. She reports that she does not drink alcohol or use illicit drugs.  FAMILY HISTORY:  Family History  Problem Relation Age of Onset  . Prostate cancer      CURRENT MEDICATIONS: Reviewed per [facility]  REVIEW OF SYSTEMS:   CARDIAC:  chronic lower extremity swelling  See HPI otherwise 14 point ROS is negative.  PHYSICAL EXAMINATION  VS:  T 98.6       P 88      RR 21      BP 116/43      POX 100% on oxygen       WT (Lb) 302  GENERAL: no acute distress, morbidly obese body habitus SKIN: warm & dry, no suspicious lesions or rashes, no excessive dryness EYES: conjunctivae normal, sclerae normal, normal eye lids MOUTH/THROAT: lips without lesions,no lesions in the mouth,tongue is without lesions,uvula elevates in midline NECK: supple, trachea midline, no neck masses, no thyroid tenderness, no thyromegaly LYMPHATICS: no LAN in the neck, no supraclavicular LAN RESPIRATORY: breathing is even &  unlabored, BS CTAB CARDIAC: RRR, no murmur,no extra heart sounds EDEMA/VARICOSITIES: left lower extremity is wrapped, +2 edema bilaterally GI:  ABDOMEN: abdomen soft, normal BS, no masses, no tenderness  LIVER/SPLEEN: no hepatomegaly, no splenomegaly MUSCULOSKELETAL: HEAD: normal to inspection & palpation BACK: no kyphosis, scoliosis or spinal processes tenderness EXTREMITIES: LEFT UPPER EXTREMITY: full range of motion, normal strength & tone RIGHT UPPER EXTREMITY:  full range of motion, normal strength & tone LEFT LOWER EXTREMITY: strength intact, range of motion moderate  RIGHT LOWER EXTREMITY: strength intact, range of motion moderate  PSYCHIATRIC: the  patient is alert & oriented to person, affect & behavior appropriate  LABS/RADIOLOGY: CT of the head:  No acute findings.   Chest x-ray:  No acute findings.  MRSA by PCR negative.    Urine culture showed no growth.   Glucose 115, otherwise BMP normal.    Magnesium 1.9, phosphorus 5.1, albumin 2.7, otherwise liver profile normal.    Hemoglobin 8.6, MCV 79.6, otherwise CBC normal.    Troponin-I less than 0.03.   ASSESSMENT/PLAN:  COPD.  Now compensated.    Hypertension.  Well controlled.   Diabetes mellitus with neuropathy.  Continue current medications.   Obstructive sleep apnea.  Continue BiPAP at night.    Bipolar disorder.  Continue current medications.   Chronic pain.  Denies any acute pain.    Constipation.  Well controlled.   Check CBC and BMP.   I have reviewed patient's medical records received at admission/from hospitalization.  CPT CODE: 29562

## 2012-12-13 NOTE — Progress Notes (Signed)
PROGRESS NOTE  DATE: 12/10/12  FACILITY: Nursing Home Location: [facility]  LEVEL OF CARE: SNF (31)  Routine Visit  CHIEF COMPLAINT:  Manage COPD, hypertension and anemia of chronic disease  HISTORY OF PRESENT ILLNESS:  REASSESSMENT OF ONGOING PROBLEM(S):  1. COPD: the COPD remains stable.  Pt denies sob, cough, wheezing or declining exercise tolerance.  No complications from the medications presently being used.  2.HTN: Pt 's HTN remains stable.  Denies CP, sob, DOE, headaches, dizziness or visual disturbances.  No complications from the medications currently being used.  Last BP : 146/94, 149/68.  3. ANEMIA: The anemia has been stable. The patient denies fatigue, melena or hematochezia. No complications from the medications currently being used. In 11/13 hemoglobin 9.2 MCV 85, in 5/14 hemoglobin 10, MCV 83.  PAST MEDICAL HISTORY : Reviewed.  No changes.  CURRENT MEDICATIONS: Reviewed per [facility]  REVIEW OF SYSTEMS:  GENERAL: no change in appetite, no fatigue, no weight changes, no fever, chills or weakness RESPIRATORY: no cough, SOB, DOE, wheezing, hemoptysis CARDIAC: no chest pain, bilateral lower extremity edema present. No palpitations GI: no abdominal pain, diarrhea, constipation, heart burn, nausea or vomiting  PHYSICAL EXAMINATION  VS:  T 99.4      P 100      RR 22     BP 146/94     POX %     WT (Lb) 315  GENERAL: no acute distress, morbidly obese body habitus EYES: conjunctivae normal, sclerae normal, normal eye lids NECK: supple, trachea midline, no neck masses, no thyroid tenderness, no thyromegaly LYMPHATICS: no LAN in the neck, no supraclavicular LAN RESPIRATORY: breathing is even & unlabored, BS CTAB CARDIAC: RRR, no murmur,no extra heart sounds, +1 bilateral lower extremity edema GI: abdomen soft, normal BS, no masses, no tenderness, no hepatomegaly, no splenomegaly PSYCHIATRIC: the patient is alert & oriented to person, affect & behavior  appropriate  LABS/RADIOLOGY:  5/14 WBC 11.5, hemoglobin 10, MCV 83, platelets 338, CMP normal, phospholipid panel normal, hemoglobin A1c 6.1, Depakote level 23  12/13 BMP normal, hemoglobin A1c 6.2, Depakote 46 11/13 hemoglobin 9.2, MCV 85 otherwise CBC normal, liver profile normal, fasting lipid panel normal  ASSESSMENT/PLAN:  1. COPD-compensated. 2. hypertension-stable. 3. anemia of chronic disease-hemoglobin improved.. 4. diabetes mellitus with neuropathy-well-controlled. 5. peripheral neuropathy-continue Neurontin. 6. constipation-well controlled. 7. lower extremity edema -- subsided. 8. depression-stable.  CPT CODE: 81191

## 2013-01-13 ENCOUNTER — Emergency Department (HOSPITAL_COMMUNITY): Payer: Medicaid Other

## 2013-01-13 ENCOUNTER — Emergency Department (HOSPITAL_COMMUNITY)
Admission: EM | Admit: 2013-01-13 | Discharge: 2013-01-13 | Disposition: A | Discharge status: Skilled Nursing Facility | Payer: Medicaid Other | Attending: Emergency Medicine | Admitting: Emergency Medicine

## 2013-01-13 ENCOUNTER — Encounter (HOSPITAL_COMMUNITY): Payer: Self-pay

## 2013-01-13 DIAGNOSIS — Z7982 Long term (current) use of aspirin: Secondary | ICD-10-CM | POA: Insufficient documentation

## 2013-01-13 DIAGNOSIS — J9610 Chronic respiratory failure, unspecified whether with hypoxia or hypercapnia: Secondary | ICD-10-CM | POA: Insufficient documentation

## 2013-01-13 DIAGNOSIS — I252 Old myocardial infarction: Secondary | ICD-10-CM | POA: Insufficient documentation

## 2013-01-13 DIAGNOSIS — M1990 Unspecified osteoarthritis, unspecified site: Secondary | ICD-10-CM

## 2013-01-13 DIAGNOSIS — E1149 Type 2 diabetes mellitus with other diabetic neurological complication: Secondary | ICD-10-CM | POA: Insufficient documentation

## 2013-01-13 DIAGNOSIS — I10 Essential (primary) hypertension: Secondary | ICD-10-CM | POA: Insufficient documentation

## 2013-01-13 DIAGNOSIS — M791 Myalgia, unspecified site: Secondary | ICD-10-CM

## 2013-01-13 DIAGNOSIS — IMO0002 Reserved for concepts with insufficient information to code with codable children: Secondary | ICD-10-CM | POA: Insufficient documentation

## 2013-01-13 DIAGNOSIS — M159 Polyosteoarthritis, unspecified: Secondary | ICD-10-CM | POA: Insufficient documentation

## 2013-01-13 DIAGNOSIS — F319 Bipolar disorder, unspecified: Secondary | ICD-10-CM | POA: Insufficient documentation

## 2013-01-13 DIAGNOSIS — I2510 Atherosclerotic heart disease of native coronary artery without angina pectoris: Secondary | ICD-10-CM | POA: Insufficient documentation

## 2013-01-13 DIAGNOSIS — Z880 Allergy status to penicillin: Secondary | ICD-10-CM | POA: Insufficient documentation

## 2013-01-13 DIAGNOSIS — G4733 Obstructive sleep apnea (adult) (pediatric): Secondary | ICD-10-CM | POA: Insufficient documentation

## 2013-01-13 DIAGNOSIS — J45909 Unspecified asthma, uncomplicated: Secondary | ICD-10-CM | POA: Insufficient documentation

## 2013-01-13 DIAGNOSIS — Z79899 Other long term (current) drug therapy: Secondary | ICD-10-CM | POA: Insufficient documentation

## 2013-01-13 DIAGNOSIS — J189 Pneumonia, unspecified organism: Secondary | ICD-10-CM | POA: Insufficient documentation

## 2013-01-13 DIAGNOSIS — I739 Peripheral vascular disease, unspecified: Secondary | ICD-10-CM | POA: Insufficient documentation

## 2013-01-13 DIAGNOSIS — I890 Lymphedema, not elsewhere classified: Secondary | ICD-10-CM | POA: Insufficient documentation

## 2013-01-13 DIAGNOSIS — IMO0001 Reserved for inherently not codable concepts without codable children: Secondary | ICD-10-CM | POA: Insufficient documentation

## 2013-01-13 LAB — BASIC METABOLIC PANEL
BUN: 13 mg/dL (ref 6–23)
Chloride: 89 mEq/L — ABNORMAL LOW (ref 96–112)
GFR calc Af Amer: >90 mL/min (ref >90)
GFR calc non Af Amer: >90 mL/min (ref >90)
Potassium: 4.1 mEq/L (ref 3.5–5.1)
Sodium: 139 mEq/L (ref 135–145)

## 2013-01-13 LAB — CBC WITH DIFFERENTIAL/PLATELET
Basophils Relative: 0 % (ref 0–1)
Eosinophils Absolute: 0.1 10*3/uL (ref 0.0–0.7)
Hemoglobin: 10.2 g/dL — ABNORMAL LOW (ref 12.0–15.0)
Lymphs Abs: 1.7 10*3/uL (ref 0.7–4.0)
MCH: 27.1 pg (ref 26.0–34.0)
MCHC: 30.5 g/dL (ref 30.0–36.0)
Monocytes Relative: 8 % (ref 3–12)
Neutro Abs: 4 10*3/uL (ref 1.7–7.7)
Neutrophils Relative %: 64 % (ref 43–77)
Platelets: 183 10*3/uL (ref 150–400)
RBC: 3.76 MIL/uL — ABNORMAL LOW (ref 3.87–5.11)

## 2013-01-13 MED ORDER — OXYCODONE-ACETAMINOPHEN 5-325 MG PO TABS
1.0000 | ORAL_TABLET | Freq: Once | ORAL | Status: AC
  Administered 2013-01-13: 1 via ORAL
  Filled 2013-01-13: qty 1

## 2013-01-13 NOTE — ED Notes (Signed)
Report called to [place] at [facility].

## 2013-01-13 NOTE — ED Notes (Signed)
Per EMS patient from [facility]. Complaining of generalized pain and SOB. Patient stated that she could no longer deal with the pain and had to do something to get help.

## 2013-01-13 NOTE — ED Notes (Signed)
130/52, 92, 18, 97% on 3 liters

## 2013-01-13 NOTE — ED Notes (Signed)
[id number]<AV> Expected date:<BR> Expected time:<BR> Means of arrival:<BR> Comments:<BR> Elderly, gen pain

## 2013-01-13 NOTE — Discharge Instructions (Signed)
Take your usual medications, as prescribed. Use the Ace wrap for comfort. Consider seeing an orthopedist to evaluate your knee pain, further.   Arthritis, Nonspecific Arthritis is inflammation of a joint. This usually means pain, redness, warmth or swelling are present. One or more joints may be involved. There are a number of types of arthritis. Your caregiver may not be able to tell what type of arthritis you have right away. CAUSES  The most common cause of arthritis is the wear and tear on the joint (osteoarthritis). This causes damage to the cartilage, which can break down over time. The knees, hips, back and neck are most often affected by this type of arthritis. Other types of arthritis and common causes of joint pain include:  Sprains and other injuries near the joint. Sometimes minor sprains and injuries cause pain and swelling that develop hours later.  Rheumatoid arthritis. This affects hands, feet and knees. It usually affects both sides of your body at the same time. It is often associated with chronic ailments, fever, weight loss and general weakness.  Crystal arthritis. Gout and pseudo gout can cause occasional acute severe pain, redness and swelling in the foot, ankle, or knee.  Infectious arthritis. Bacteria can get into a joint through a break in overlying skin. This can cause infection of the joint. Bacteria and viruses can also spread through the blood and affect your joints.  Drug, infectious and allergy reactions. Sometimes joints can become mildly painful and slightly swollen with these types of illnesses. SYMPTOMS   Pain is the main symptom.  Your joint or joints can also be red, swollen and warm or hot to the touch.  You may have a fever with certain types of arthritis, or even feel overall ill.  The joint with arthritis will hurt with movement. Stiffness is present with some types of arthritis. DIAGNOSIS  Your caregiver will suspect arthritis based on your  description of your symptoms and on your exam. Testing may be needed to find the type of arthritis:  Blood and sometimes urine tests.  X-ray tests and sometimes CT or MRI scans.  Removal of fluid from the joint (arthrocentesis) is done to check for bacteria, crystals or other causes. Your caregiver (or a specialist) will numb the area over the joint with a local anesthetic, and use a needle to remove joint fluid for examination. This procedure is only minimally uncomfortable.  Even with these tests, your caregiver may not be able to tell what kind of arthritis you have. Consultation with a specialist (rheumatologist) may be helpful. TREATMENT  Your caregiver will discuss with you treatment specific to your type of arthritis. If the specific type cannot be determined, then the following general recommendations may apply. Treatment of severe joint pain includes:  Rest.  Elevation.  Anti-inflammatory medication (for example, ibuprofen) may be prescribed. Avoiding activities that cause increased pain.  Only take over-the-counter or prescription medicines for pain and discomfort as recommended by your caregiver.  Cold packs over an inflamed joint may be used for 10 to 15 minutes every hour. Hot packs sometimes feel better, but do not use overnight. Do not use hot packs if you are diabetic without your caregiver's permission.  A cortisone shot into arthritic joints may help reduce pain and swelling.  Any acute arthritis that gets worse over the next 1 to 2 days needs to be looked at to be sure there is no joint infection. Long-term arthritis treatment involves modifying activities and lifestyle to reduce joint  stress jarring. This can include weight loss. Also, exercise is needed to nourish the joint cartilage and remove waste. This helps keep the muscles around the joint strong. HOME CARE INSTRUCTIONS   Do not take aspirin to relieve pain if gout is suspected. This elevates uric acid  levels.  Only take over-the-counter or prescription medicines for pain, discomfort or fever as directed by your caregiver.  Rest the joint as much as possible.  If your joint is swollen, keep it elevated.  Use crutches if the painful joint is in your leg.  Drinking plenty of fluids may help for certain types of arthritis.  Follow your caregiver's dietary instructions.  Try low-impact exercise such as:  Swimming.  Water aerobics.  Biking.  Walking.  Morning stiffness is often relieved by a warm shower.  Put your joints through regular range-of-motion. SEEK MEDICAL CARE IF:   You do not feel better in 24 hours or are getting worse.  You have side effects to medications, or are not getting better with treatment. SEEK IMMEDIATE MEDICAL CARE IF:   You have a fever.  You develop severe joint pain, swelling or redness.  Many joints are involved and become painful and swollen.  There is severe back pain and/or leg weakness.  You have loss of bowel or bladder control. Document Released: 07/28/2004 Document Revised: 09/12/2011 Document Reviewed: 08/13/2008 [facility] Patient Information 2014 [place], [place].

## 2013-01-13 NOTE — ED Provider Notes (Signed)
History    CSN: [id number] Arrival date & time 01/13/13  0102  First MD Initiated Contact with Patient 01/13/13 [phone]     Chief Complaint  Patient presents with  . Shortness of Breath  . Joint Pain   (Consider location/radiation/quality/duration/timing/severity/associated sxs/prior Treatment) HPI Comments: [name] is a [name] she complains of prolonged and continuous pain in her chest,  abdomen, legs , and back for several weeks. She has increased swelling and pain in her right knee for the last 3 days. There's been no known trauma. She has talked to her doctor about these problems, but her doctor has not done anything to help her. She denies fever, chills, nausea, vomiting, shortness of breath. There are no other known modifying factors.  Patient is a 62 y.o. female presenting with shortness of breath. The history is provided by the patient and the nursing home.  Shortness of Breath  Past Medical History  Diagnosis Date  . COPD   . Hypertension   . Chronic respiratory failure     3 L oxygen  . GERD (gastroesophageal reflux disease)   . Diabetes mellitus   . Obesity, Class III, BMI 40-49.9 (morbid obesity)   . Obstructive sleep apnea on CPAP   . Peripheral vascular disease   . Bipolar disease, chronic   . Lymphedema   . COPD (chronic obstructive pulmonary disease)   . Cellulitis   . Coronary artery disease   . Asthma   . Myocardial infarction   . Vertigo    Past Surgical History  Procedure Laterality Date  . Abdominal hysterectomy    . Cholecystectomy    . Appendectomy    . Tubal ligation     Family History  Problem Relation Age of Onset  . Prostate cancer     History  Substance Use Topics  . Smoking status: Never Smoker   . Smokeless tobacco: Never Used  . Alcohol Use: No   OB History   Grav Para Term Preterm Abortions TAB SAB Ect Mult Living                 Review of Systems  Respiratory: Positive for shortness of breath.   All other  systems reviewed and are negative.    Allergies  Bee venom; Nyquil cold &; Penicillins; and Ppd  Home Medications   Current Outpatient Rx  Name  Route  Sig  Dispense  Refill  . albuterol (PROVENTIL) (2.5 MG/3ML) 0.083% nebulizer solution   Nebulization   Take 2.5 mg by nebulization every 6 (six) hours as needed for wheezing.         Marland Kitchen amLODipine (NORVASC) 5 MG tablet   Oral   Take 5 mg by mouth daily.         . ARIPiprazole (ABILIFY) 10 MG tablet   Oral   Take 10 mg by mouth daily. For depressive psychosis         . ascorbic acid (VITAMIN C) 500 MG tablet   Oral   Take 500 mg by mouth 2 (two) times daily.         Marland Kitchen aspirin 325 MG tablet   Oral   Take 325 mg by mouth daily.         . budesonide-formoterol (SYMBICORT) 160-4.5 MCG/ACT inhaler   Inhalation   Inhale 2 puffs into the lungs 2 (two) times daily.         . busPIRone (BUSPAR) 10 MG tablet   Oral  Take 10 mg by mouth 2 (two) times daily.         . calcium-vitamin D (OSCAL WITH D) 500-200 MG-UNIT per tablet   Oral   Take 1 tablet by mouth 2 (two) times daily.         . cloNIDine (CATAPRES) 0.2 MG tablet   Oral   Take 0.2 mg by mouth 2 (two) times daily.         . divalproex (DEPAKOTE ER) 500 MG 24 hr tablet   Oral   Take 500 mg by mouth 2 (two) times daily.         Marland Kitchen docusate sodium (COLACE) 100 MG capsule   Oral   Take 100 mg by mouth daily.         . DULoxetine (CYMBALTA) 60 MG capsule   Oral   Take 60 mg by mouth 2 (two) times daily.         . feeding supplement (PRO-STAT SUGAR FREE 64) LIQD   Oral   Take 30 mLs by mouth 2 (two) times daily.         . fluticasone (FLONASE) 50 MCG/ACT nasal spray   Nasal   Place 2 sprays into the nose daily.         . folic acid (FOLVITE) 1 MG tablet   Oral   Take 1 mg by mouth daily.         . furosemide (LASIX) 40 MG tablet   Oral   Take 40 mg by mouth daily.         Marland Kitchen gabapentin (NEURONTIN) 400 MG capsule   Oral    Take 400 mg by mouth 3 (three) times daily.         . isosorbide mononitrate (IMDUR) 30 MG 24 hr tablet   Oral   Take 30 mg by mouth daily.         Marland Kitchen lisinopril (PRINIVIL,ZESTRIL) 10 MG tablet   Oral   Take 10 mg by mouth daily.         . metoCLOPramide (REGLAN) 5 MG tablet   Oral   Take 5 mg by mouth 3 (three) times daily.         . Multiple Vitamins-Minerals (CERTAGEN PO)   Oral   Take 1 tablet by mouth daily.         . nitroGLYCERIN (NITROSTAT) 0.4 MG SL tablet   Sublingual   Place 0.4 mg under the tongue every 5 (five) minutes as needed for chest pain.          Marland Kitchen Olopatadine HCl (PATADAY) 0.2 % SOLN   Both Eyes   Place 1 drop into both eyes daily.          Marland Kitchen oxyCODONE-acetaminophen (PERCOCET/ROXICET) 5-325 MG per tablet   Oral   Take 1 tablet by mouth every 4 (four) hours as needed. For pain   180 tablet   0   . pantoprazole (PROTONIX) 40 MG tablet   Oral   Take 40 mg by mouth 2 (two) times daily.         Marland Kitchen Propylene Glycol (SYSTANE BALANCE OP)   Both Eyes   Place 1 drop into both eyes 2 (two) times daily.         Marland Kitchen senna (SENOKOT) 8.6 MG TABS   Oral   Take 2 tablets by mouth daily.         . simethicone (MYLICON) 80 MG chewable tablet   Oral   Chew 80  mg by mouth every 8 (eight) hours.         Marland Kitchen tiotropium (SPIRIVA) 18 MCG inhalation capsule   Inhalation   Place 18 mcg into inhaler and inhale daily.         . vitamin D, CHOLECALCIFEROL, 400 UNITS tablet   Oral   Take 400 Units by mouth daily.          BP 148/62  Pulse 88  Temp(Src) 98.5 F (36.9 C) (Oral)  Resp 22  SpO2 100% Physical Exam  Nursing note and vitals reviewed. Constitutional: She is oriented to person, place, and time. She appears well-developed.  Obese, appears older than stated age. She is unable sit up in bed, secondary to weakness, and obesity  HENT:  Head: Normocephalic and atraumatic.  Eyes: Conjunctivae and EOM are normal. Pupils are equal, round,  and reactive to light.  Neck: Normal range of motion and phonation normal. Neck supple.  Cardiovascular: Normal rate, regular rhythm and intact distal pulses.   Pulmonary/Chest: Effort normal and breath sounds normal. She exhibits no tenderness.  Abdominal: Soft. She exhibits no distension and no mass. There is no tenderness. There is no guarding.  Mild diffuse tenderness. Very large panniculus  Musculoskeletal: Normal range of motion.  Right knee is swollen greater than left, and is tender to palpation. I suspect that she has a knee effusion. The left lower leg and foot is wrapped in a compression dressing; she states is for ulcers.  Neurological: She is alert and oriented to person, place, and time. She has normal strength. She exhibits normal muscle tone. Coordination normal.  No asymmetry of strength  Skin: Skin is warm and dry.  Psychiatric: She has a normal mood and affect. Her behavior is normal. Judgment and thought content normal.    ED Course  Procedures (including critical care time)     Date: 01/13/13  Rate: 88  Rhythm: normal sinus rhythm  QRS Axis: right  PR and QT Intervals: normal  ST/T Wave abnormalities: normal  PR and QRS Conduction Disutrbances:none  Narrative Interpretation:   Old EKG Reviewed: changes noted- rate slower, 11/09/12   Labs Reviewed  CBC WITH DIFFERENTIAL - Abnormal; Notable for the following:    RBC 3.76 (*)    Hemoglobin 10.2 (*)    HCT 33.4 (*)    All other components within normal limits  BASIC METABOLIC PANEL - Abnormal; Notable for the following:    Chloride 89 (*)    CO2 >45 (*)    Glucose, Bld 118 (*)    All other components within normal limits   Dg Chest 2 View  01/13/2013   *RADIOLOGY REPORT*  Clinical Data: Shortness of breath and fever.  CHEST - 2 VIEW  Comparison: 11/09/2012  Findings: Lung volumes are extremely low bilaterally.  The heart is mildly enlarged.  No overt pulmonary consolidation or edema is identified.  No  significant pleural fluid is seen.  IMPRESSION: Very low lung volumes and mild cardiomegaly.   Original Report Authenticated By: Irish Lack, M.D.   Dg Knee Complete 4 Views Right  01/13/2013   *RADIOLOGY REPORT*  Clinical Data: Knee pain, no injury  RIGHT KNEE - COMPLETE 4+ VIEW  Comparison: None  Findings: Advanced degenerative changes in the knee joint with marked cartilage loss and extensive osteophyte formation in all three compartments.  There is lateral subluxation of the tibia.  Negative for fracture.  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  Negative for fracture.  Original Report Authenticated By: Janeece Riggers, M.D.   1. Degenerative joint disease   2. Myalgia     MDM  Nonspecific complaints with chronic right knee pain, related to degenerative joint disease. Doubt ACS, PE, or pneumonia. No evidence for UTI, metabolic instability, or suspected occult infection. She is stable for discharge  Nursing Notes Reviewed/ Care Coordinated, and agree without changes. Applicable Imaging Reviewed.  Interpretation of Laboratory Data incorporated into ED treatment   Plan: Home Medications- usual; Home Treatments and Observation- rest, ACE wrap right knee; return here if the recommended treatment, does not improve the symptoms; Recommended follow up- PCP for check up 1 week, consider Ortho eval. For knee pain.    Flint Melter, MD 01/13/13 [phone]

## 2013-01-13 NOTE — ED Notes (Signed)
Patient is obese and requires assist X2 with moving and turning. She becomes slightly labored with movement but settles quickly. She normally wears 3L [place] at the nursing home. O2 saturation has been in upper 90's to 100%.

## 2013-01-17 ENCOUNTER — Other Ambulatory Visit: Payer: Self-pay

## 2013-01-17 ENCOUNTER — Other Ambulatory Visit: Payer: Self-pay | Admitting: Internal Medicine

## 2013-01-17 DIAGNOSIS — Z1231 Encounter for screening mammogram for malignant neoplasm of breast: Secondary | ICD-10-CM

## 2013-01-19 ENCOUNTER — Inpatient Hospital Stay (HOSPITAL_COMMUNITY)
Admission: EM | Admit: 2013-01-19 | Discharge: 2013-01-23 | DRG: 208 | Disposition: A | Discharge status: Skilled Nursing Facility | Payer: Medicaid Other | Attending: Internal Medicine | Admitting: Internal Medicine

## 2013-01-19 ENCOUNTER — Encounter (HOSPITAL_COMMUNITY): Payer: Self-pay

## 2013-01-19 ENCOUNTER — Emergency Department (HOSPITAL_COMMUNITY): Payer: Medicaid Other

## 2013-01-19 DIAGNOSIS — Z880 Allergy status to penicillin: Secondary | ICD-10-CM

## 2013-01-19 DIAGNOSIS — G4733 Obstructive sleep apnea (adult) (pediatric): Secondary | ICD-10-CM | POA: Diagnosis present

## 2013-01-19 DIAGNOSIS — I890 Lymphedema, not elsewhere classified: Secondary | ICD-10-CM | POA: Diagnosis present

## 2013-01-19 DIAGNOSIS — I2510 Atherosclerotic heart disease of native coronary artery without angina pectoris: Secondary | ICD-10-CM | POA: Diagnosis present

## 2013-01-19 DIAGNOSIS — I739 Peripheral vascular disease, unspecified: Secondary | ICD-10-CM | POA: Diagnosis present

## 2013-01-19 DIAGNOSIS — I872 Venous insufficiency (chronic) (peripheral): Secondary | ICD-10-CM | POA: Diagnosis present

## 2013-01-19 DIAGNOSIS — L97809 Non-pressure chronic ulcer of other part of unspecified lower leg with unspecified severity: Secondary | ICD-10-CM | POA: Diagnosis present

## 2013-01-19 DIAGNOSIS — Z9089 Acquired absence of other organs: Secondary | ICD-10-CM

## 2013-01-19 DIAGNOSIS — Z887 Allergy status to serum and vaccine status: Secondary | ICD-10-CM

## 2013-01-19 DIAGNOSIS — Z79899 Other long term (current) drug therapy: Secondary | ICD-10-CM

## 2013-01-19 DIAGNOSIS — R7989 Other specified abnormal findings of blood chemistry: Secondary | ICD-10-CM

## 2013-01-19 DIAGNOSIS — E662 Morbid (severe) obesity with alveolar hypoventilation: Secondary | ICD-10-CM | POA: Diagnosis present

## 2013-01-19 DIAGNOSIS — I5031 Acute diastolic (congestive) heart failure: Secondary | ICD-10-CM | POA: Diagnosis present

## 2013-01-19 DIAGNOSIS — Z9851 Tubal ligation status: Secondary | ICD-10-CM

## 2013-01-19 DIAGNOSIS — J9620 Acute and chronic respiratory failure, unspecified whether with hypoxia or hypercapnia: Principal | ICD-10-CM

## 2013-01-19 DIAGNOSIS — I5023 Acute on chronic systolic (congestive) heart failure: Secondary | ICD-10-CM

## 2013-01-19 DIAGNOSIS — Z7982 Long term (current) use of aspirin: Secondary | ICD-10-CM

## 2013-01-19 DIAGNOSIS — J9610 Chronic respiratory failure, unspecified whether with hypoxia or hypercapnia: Secondary | ICD-10-CM | POA: Diagnosis present

## 2013-01-19 DIAGNOSIS — I252 Old myocardial infarction: Secondary | ICD-10-CM

## 2013-01-19 DIAGNOSIS — E876 Hypokalemia: Secondary | ICD-10-CM | POA: Diagnosis present

## 2013-01-19 DIAGNOSIS — J449 Chronic obstructive pulmonary disease, unspecified: Secondary | ICD-10-CM | POA: Diagnosis present

## 2013-01-19 DIAGNOSIS — I509 Heart failure, unspecified: Secondary | ICD-10-CM | POA: Diagnosis present

## 2013-01-19 DIAGNOSIS — Z9981 Dependence on supplemental oxygen: Secondary | ICD-10-CM

## 2013-01-19 DIAGNOSIS — F319 Bipolar disorder, unspecified: Secondary | ICD-10-CM | POA: Diagnosis present

## 2013-01-19 DIAGNOSIS — Z6841 Body Mass Index (BMI) 40.0 and over, adult: Secondary | ICD-10-CM

## 2013-01-19 DIAGNOSIS — R0902 Hypoxemia: Secondary | ICD-10-CM

## 2013-01-19 DIAGNOSIS — E119 Type 2 diabetes mellitus without complications: Secondary | ICD-10-CM

## 2013-01-19 DIAGNOSIS — K219 Gastro-esophageal reflux disease without esophagitis: Secondary | ICD-10-CM | POA: Diagnosis present

## 2013-01-19 DIAGNOSIS — I10 Essential (primary) hypertension: Secondary | ICD-10-CM | POA: Diagnosis present

## 2013-01-19 DIAGNOSIS — J9600 Acute respiratory failure, unspecified whether with hypoxia or hypercapnia: Secondary | ICD-10-CM

## 2013-01-19 DIAGNOSIS — J4489 Other specified chronic obstructive pulmonary disease: Secondary | ICD-10-CM | POA: Diagnosis present

## 2013-01-19 DIAGNOSIS — J189 Pneumonia, unspecified organism: Secondary | ICD-10-CM

## 2013-01-19 DIAGNOSIS — D649 Anemia, unspecified: Secondary | ICD-10-CM

## 2013-01-19 DIAGNOSIS — G9340 Encephalopathy, unspecified: Secondary | ICD-10-CM

## 2013-01-19 DIAGNOSIS — E785 Hyperlipidemia, unspecified: Secondary | ICD-10-CM | POA: Diagnosis present

## 2013-01-19 DIAGNOSIS — R7881 Bacteremia: Secondary | ICD-10-CM

## 2013-01-19 DIAGNOSIS — Z888 Allergy status to other drugs, medicaments and biological substances status: Secondary | ICD-10-CM

## 2013-01-19 LAB — BASIC METABOLIC PANEL
BUN: 19 mg/dL (ref 6–23)
CO2: 44 mEq/L (ref 19–32)
Chloride: 89 mEq/L — ABNORMAL LOW (ref 96–112)
Creatinine, Ser: 0.85 mg/dL (ref 0.50–1.10)
GFR calc Af Amer: 83 mL/min — ABNORMAL LOW (ref >90)
GFR calc Af Amer: 75 mL/min — ABNORMAL LOW (ref >90)
GFR calc non Af Amer: 65 mL/min — ABNORMAL LOW (ref >90)
Glucose, Bld: 246 mg/dL — ABNORMAL HIGH (ref 70–99)
Glucose, Bld: 124 mg/dL — ABNORMAL HIGH (ref 70–99)
Potassium: 4.1 mEq/L (ref 3.5–5.1)
Potassium: 3.7 mEq/L (ref 3.5–5.1)
Sodium: 140 mEq/L (ref 135–145)

## 2013-01-19 LAB — CBC WITH DIFFERENTIAL/PLATELET
Eosinophils Relative: 0 % (ref 0–5)
HCT: 36 % (ref 36.0–46.0)
HCT: 32.8 % — ABNORMAL LOW (ref 36.0–46.0)
Hemoglobin: 10.9 g/dL — ABNORMAL LOW (ref 12.0–15.0)
Hemoglobin: 10 g/dL — ABNORMAL LOW (ref 12.0–15.0)
Lymphocytes Relative: 37 % (ref 12–46)
Lymphocytes Relative: 14 % (ref 12–46)
Lymphs Abs: 3.6 10*3/uL (ref 0.7–4.0)
Lymphs Abs: 1.3 10*3/uL (ref 0.7–4.0)
MCV: 87.5 fL (ref 78.0–100.0)
Monocytes Absolute: 0.6 10*3/uL (ref 0.1–1.0)
Monocytes Absolute: 0.4 10*3/uL (ref 0.1–1.0)
Monocytes Relative: 6 % (ref 3–12)
Monocytes Relative: 5 % (ref 3–12)
Neutro Abs: 5.4 10*3/uL (ref 1.7–7.7)
Neutrophils Relative %: 55 % (ref 43–77)
RBC: 3.96 MIL/uL (ref 3.87–5.11)
RBC: 3.75 MIL/uL — ABNORMAL LOW (ref 3.87–5.11)
WBC: 9.1 10*3/uL (ref 4.0–10.5)

## 2013-01-19 LAB — GLUCOSE, CAPILLARY: Glucose-Capillary: 148 mg/dL — ABNORMAL HIGH (ref 70–99)

## 2013-01-19 LAB — URINALYSIS, ROUTINE W REFLEX MICROSCOPIC
Glucose, UA: NEGATIVE mg/dL
Leukocytes, UA: NEGATIVE
Protein, ur: >300 mg/dL — AB
Specific Gravity, Urine: 1.02 (ref 1.005–1.030)
pH: 6 (ref 5.0–8.0)

## 2013-01-19 LAB — COMPREHENSIVE METABOLIC PANEL
ALT: 11 U/L (ref 0–35)
AST: 23 U/L (ref 0–37)
CO2: >45 mEq/L (ref 19–32)
Chloride: 88 mEq/L — ABNORMAL LOW (ref 96–112)
Creatinine, Ser: 0.86 mg/dL (ref 0.50–1.10)
GFR calc non Af Amer: 70 mL/min — ABNORMAL LOW (ref >90)
Glucose, Bld: 109 mg/dL — ABNORMAL HIGH (ref 70–99)
Sodium: 141 mEq/L (ref 135–145)
Total Bilirubin: 0.1 mg/dL — ABNORMAL LOW (ref 0.3–1.2)

## 2013-01-19 LAB — POCT I-STAT 3, ART BLOOD GAS (G3+)
Acid-Base Excess: 24 mmol/L — ABNORMAL HIGH (ref 0.0–2.0)
Bicarbonate: 50.6 mEq/L — ABNORMAL HIGH (ref 20.0–24.0)
O2 Saturation: 98 %
Patient temperature: 37.8
TCO2: >50 mmol/L (ref 0–100)
pH, Arterial: 7.495 — ABNORMAL HIGH (ref 7.350–7.450)

## 2013-01-19 LAB — URINE MICROSCOPIC-ADD ON

## 2013-01-19 MED ORDER — CHLORHEXIDINE GLUCONATE 0.12 % MT SOLN
15.0000 mL | Freq: Two times a day (BID) | OROMUCOSAL | Status: DC
  Administered 2013-01-20 – 2013-01-21 (×3): 15 mL via OROMUCOSAL
  Filled 2013-01-19 (×3): qty 15

## 2013-01-19 MED ORDER — VITAL AF 1.2 CAL PO LIQD
1000.0000 mL | ORAL | Status: DC
  Filled 2013-01-19 (×2): qty 1000

## 2013-01-19 MED ORDER — FENTANYL CITRATE 0.05 MG/ML IJ SOLN
25.0000 ug | INTRAMUSCULAR | Status: DC | PRN
  Administered 2013-01-19: 100 ug via INTRAVENOUS
  Filled 2013-01-19: qty 2

## 2013-01-19 MED ORDER — HEPARIN SODIUM (PORCINE) 5000 UNIT/ML IJ SOLN
5000.0000 [IU] | Freq: Three times a day (TID) | INTRAMUSCULAR | Status: DC
  Administered 2013-01-19 – 2013-01-23 (×14): 5000 [IU] via SUBCUTANEOUS
  Filled 2013-01-19 (×16): qty 1

## 2013-01-19 MED ORDER — DEXMEDETOMIDINE HCL IN NACL 200 MCG/50ML IV SOLN
0.4000 ug/kg/h | INTRAVENOUS | Status: DC
  Filled 2013-01-19: qty 50

## 2013-01-19 MED ORDER — DULOXETINE HCL 60 MG PO CPEP
60.0000 mg | ORAL_CAPSULE | Freq: Two times a day (BID) | ORAL | Status: DC
  Administered 2013-01-19: 60 mg via ORAL
  Filled 2013-01-19 (×2): qty 1

## 2013-01-19 MED ORDER — CHLORHEXIDINE GLUCONATE 0.12 % MT SOLN
OROMUCOSAL | Status: AC
  Administered 2013-01-19: 15 mL via OROMUCOSAL
  Filled 2013-01-19: qty 15

## 2013-01-19 MED ORDER — NITROGLYCERIN IN D5W 200-5 MCG/ML-% IV SOLN: 10.0000 ug/min | INTRAVENOUS | Status: DC

## 2013-01-19 MED ORDER — POTASSIUM CHLORIDE 20 MEQ/15ML (10%) PO LIQD
ORAL | Status: AC
  Administered 2013-01-19: 40 meq
  Filled 2013-01-19: qty 30

## 2013-01-19 MED ORDER — FUROSEMIDE 10 MG/ML IJ SOLN
80.0000 mg | Freq: Once | INTRAMUSCULAR | Status: AC
  Administered 2013-01-19: 80 mg via INTRAVENOUS
  Filled 2013-01-19: qty 8

## 2013-01-19 MED ORDER — PROMOTE PO LIQD
1000.0000 mL | ORAL | Status: DC
  Administered 2013-01-19: 15 mL
  Filled 2013-01-19 (×4): qty 1000

## 2013-01-19 MED ORDER — BUSPIRONE HCL 10 MG PO TABS
10.0000 mg | ORAL_TABLET | Freq: Two times a day (BID) | ORAL | Status: DC
  Administered 2013-01-19 – 2013-01-23 (×9): 10 mg via ORAL
  Filled 2013-01-19 (×10): qty 1

## 2013-01-19 MED ORDER — ARIPIPRAZOLE 10 MG PO TABS
10.0000 mg | ORAL_TABLET | Freq: Every day | ORAL | Status: DC
Start: 2013-01-19 — End: 2013-01-23
  Administered 2013-01-19 – 2013-01-23 (×5): 10 mg via ORAL
  Filled 2013-01-19 (×5): qty 1

## 2013-01-19 MED ORDER — FUROSEMIDE 10 MG/ML IJ SOLN
40.0000 mg | Freq: Three times a day (TID) | INTRAMUSCULAR | Status: AC
  Administered 2013-01-19 – 2013-01-20 (×3): 40 mg via INTRAVENOUS
  Filled 2013-01-19 (×3): qty 4

## 2013-01-19 MED ORDER — ALBUTEROL SULFATE (5 MG/ML) 0.5% IN NEBU
2.5000 mg | INHALATION_SOLUTION | RESPIRATORY_TRACT | Status: DC
  Administered 2013-01-19: 2.5 mg via RESPIRATORY_TRACT
  Filled 2013-01-19 (×2): qty 0.5

## 2013-01-19 MED ORDER — PRO-STAT SUGAR FREE PO LIQD
60.0000 mL | Freq: Four times a day (QID) | ORAL | Status: DC
  Administered 2013-01-19 – 2013-01-20 (×4): 60 mL
  Filled 2013-01-19 (×11): qty 60

## 2013-01-19 MED ORDER — DIVALPROEX SODIUM ER 500 MG PO TB24
500.0000 mg | ORAL_TABLET | Freq: Two times a day (BID) | ORAL | Status: DC
  Administered 2013-01-19: 500 mg via ORAL
  Filled 2013-01-19 (×2): qty 1

## 2013-01-19 MED ORDER — FUROSEMIDE 10 MG/ML IJ SOLN
40.0000 mg | Freq: Every day | INTRAMUSCULAR | Status: DC
  Administered 2013-01-19: 40 mg via INTRAVENOUS
  Filled 2013-01-19: qty 4

## 2013-01-19 MED ORDER — IPRATROPIUM BROMIDE 0.02 % IN SOLN
0.5000 mg | Freq: Four times a day (QID) | RESPIRATORY_TRACT | Status: DC
  Administered 2013-01-19 – 2013-01-23 (×17): 0.5 mg via RESPIRATORY_TRACT
  Filled 2013-01-19 (×16): qty 2.5

## 2013-01-19 MED ORDER — PROPOFOL 10 MG/ML IV EMUL
INTRAVENOUS | Status: AC
  Administered 2013-01-19: 3 mL
  Administered 2013-01-19: 30 ug/kg/min
  Filled 2013-01-19: qty 100

## 2013-01-19 MED ORDER — ISOSORBIDE MONONITRATE 15 MG HALF TABLET
15.0000 mg | ORAL_TABLET | Freq: Every day | ORAL | Status: DC
  Administered 2013-01-19 – 2013-01-23 (×5): 15 mg via ORAL
  Filled 2013-01-19 (×5): qty 1

## 2013-01-19 MED ORDER — PROPOFOL 10 MG/ML IV EMUL
5.0000 ug/kg/min | INTRAVENOUS | Status: DC
Start: 2013-01-19 — End: 2013-01-21
  Administered 2013-01-19: 10 ug/kg/min via INTRAVENOUS
  Administered 2013-01-19: 8.339 ug/kg/min via INTRAVENOUS
  Administered 2013-01-20: 12 ug/kg/min via INTRAVENOUS
  Filled 2013-01-19 (×3): qty 100

## 2013-01-19 MED ORDER — BIOTENE DRY MOUTH MT LIQD
15.0000 mL | Freq: Four times a day (QID) | OROMUCOSAL | Status: DC
  Administered 2013-01-19 – 2013-01-21 (×6): 15 mL via OROMUCOSAL

## 2013-01-19 MED ORDER — SODIUM CHLORIDE 0.9 % IV SOLN
250.0000 mL | INTRAVENOUS | Status: DC | PRN
  Administered 2013-01-19: 250 mL via INTRAVENOUS

## 2013-01-19 MED ORDER — AMLODIPINE BESYLATE 5 MG PO TABS
5.0000 mg | ORAL_TABLET | Freq: Every day | ORAL | Status: DC
  Administered 2013-01-19 – 2013-01-23 (×5): 5 mg via ORAL
  Filled 2013-01-19 (×5): qty 1

## 2013-01-19 MED ORDER — ADULT MULTIVITAMIN W/MINERALS CH
1.0000 | ORAL_TABLET | Freq: Every day | ORAL | Status: DC
  Administered 2013-01-19 – 2013-01-20 (×2): 1
  Filled 2013-01-19 (×2): qty 1

## 2013-01-19 MED ORDER — WHITE PETROLATUM GEL
Status: AC
  Administered 2013-01-19: 0.2
  Filled 2013-01-19: qty 5

## 2013-01-19 MED ORDER — ALBUTEROL SULFATE (5 MG/ML) 0.5% IN NEBU
2.5000 mg | INHALATION_SOLUTION | Freq: Four times a day (QID) | RESPIRATORY_TRACT | Status: DC
  Administered 2013-01-19 – 2013-01-23 (×16): 2.5 mg via RESPIRATORY_TRACT
  Filled 2013-01-19 (×15): qty 0.5

## 2013-01-19 MED ORDER — LISINOPRIL 5 MG PO TABS
5.0000 mg | ORAL_TABLET | Freq: Every day | ORAL | Status: DC
  Administered 2013-01-19 – 2013-01-23 (×5): 5 mg via ORAL
  Filled 2013-01-19 (×5): qty 1

## 2013-01-19 MED ORDER — PANTOPRAZOLE SODIUM 40 MG IV SOLR
40.0000 mg | INTRAVENOUS | Status: DC
  Administered 2013-01-19 – 2013-01-20 (×2): 40 mg via INTRAVENOUS
  Filled 2013-01-19 (×3): qty 40

## 2013-01-19 MED ORDER — VALPROIC ACID 250 MG/5ML PO SYRP
500.0000 mg | ORAL_SOLUTION | Freq: Two times a day (BID) | ORAL | Status: DC
  Administered 2013-01-19 – 2013-01-20 (×3): 500 mg
  Filled 2013-01-19 (×5): qty 10

## 2013-01-19 MED ORDER — FENTANYL BOLUS VIA INFUSION
25.0000 ug | Freq: Four times a day (QID) | INTRAVENOUS | Status: DC | PRN
  Filled 2013-01-19: qty 100

## 2013-01-19 MED ORDER — POTASSIUM CHLORIDE 20 MEQ/15ML (10%) PO LIQD
40.0000 meq | Freq: Once | ORAL | Status: AC
  Filled 2013-01-19: qty 30

## 2013-01-19 MED ORDER — ACETAMINOPHEN 160 MG/5ML PO SOLN
650.0000 mg | Freq: Four times a day (QID) | ORAL | Status: DC | PRN
  Administered 2013-01-19 – 2013-01-22 (×3): 650 mg
  Filled 2013-01-19 (×4): qty 20.3

## 2013-01-19 MED ORDER — SODIUM CHLORIDE 0.9 % IV SOLN
25.0000 ug/h | INTRAVENOUS | Status: DC
  Filled 2013-01-19: qty 50

## 2013-01-19 NOTE — Progress Notes (Signed)
PULMONARY  / CRITICAL CARE MEDICINE  Name: [name] MRN: [id number] DOB: June 17, 1951    ADMISSION DATE:  01/19/2013 CONSULTATION DATE:    REFERRING MD :   PRIMARY SERVICE: PCCM  CHIEF COMPLAINT:  resp failure  BRIEF PATIENT DESCRIPTION: 62 yo NH resident with COPD, OSA, OHS (on BiPAP / oxygen), developed resp distress at SNF, failed CPAP and became unresponsive. Intubated. Has leg edema.   SIGNIFICANT EVENTS / STUDIES:  Intubated  LINES / TUBES: Foley 7/19 >>> OGT 7/19 >>>  CULTURES: None  ANTIBIOTICS: None  SUBJECTIVE:  Feeling some better.  Denies SOB on vent.  Admitted, intubated early this am.   VITAL SIGNS: Temp:  [98.1 F (36.7 C)-99 F (37.2 C)] 98.1 F (36.7 C) (07/19 0745) Pulse Rate:  [72-118] 77 (07/19 0900) Resp:  [14-28] 20 (07/19 0900) BP: (91-215)/(45-120) 154/69 mmHg (07/19 0920) SpO2:  [95 %-100 %] 100 % (07/19 0900) FiO2 (%):  [50 %-100 %] 50 % (07/19 0808) Weight:  [308 lb 6.8 oz (139.9 kg)-308 lb 10.3 oz (140 kg)] 308 lb 6.8 oz (139.9 kg) (07/19 0500) HEMODYNAMICS:   VENTILATOR SETTINGS: Vent Mode:  [-] PRVC FiO2 (%):  [50 %-100 %] 50 % Set Rate:  [14 bmp-28 bmp] 20 bmp Vt Set:  [450 mL-500 mL] 450 mL PEEP:  [5 cmH20] 5 cmH20 Plateau Pressure:  [22 cmH20-29 cmH20] 22 cmH20 INTAKE / OUTPUT: Intake/Output     07/18 0701 - 07/19 0700 07/19 0701 - 07/20 0700   I.V. (mL/kg) 12.1 (0.1)    Total Intake(mL/kg) 12.1 (0.1)    Urine (mL/kg/hr) 655 200 (0.5)   Emesis/NG output 5    Total Output 660 200   Net -647.9 -200          PHYSICAL EXAMINATION: General:  Intubated, NAD Neuro:  Awake, alert, follows commands, MAE, gen weakness  HEENT:  Atraumatic, PERRLA, no LN, trachea midline Cardiovascular:  RRR, grade 2/6 systolic murmur Lungs:  Coarse breath sounds, mild expiratory wheeze bilaterally Abdomen:  obese, soft, mild tenderness diffusely  Musculoskeletal:  Bilateral leg edema Skin:  Chronic skin changes on the lower  ext  LABS:  Recent Labs Lab 01/13/13 0924 01/19/13 0230 01/19/13 0231 01/19/13 0241 01/19/13 0512  HGB 10.2* 10.9*  --   --   --   WBC 6.3 9.8  --   --   --   PLT 183 217  --   --   --   NA 139 141  --   --   --   K 4.1 4.1  --   --   --   CL 89* 89*  --   --   --   CO2 >45* 44*  --   --   --   GLUCOSE 118* 246*  --   --   --   BUN 13 19  --   --   --   CREATININE 0.63 0.85  --   --   --   CALCIUM 9.9 9.5  --   --   --   LATICACIDVEN  --   --   --  3.73*  --   TROPONINI  --   --  <0.30  --   --   PROBNP  --   --  1633.0*  --   --   PHART  --   --   --   --  7.495*  PCO2ART  --   --   --   --  66.0*  PO2ART  --   --   --   --  105.0*   No results found for this basename: GLUCAP,  in the last 168 hours  CXR: Dg Chest Portable 1 View  01/19/2013   *RADIOLOGY REPORT*  Clinical Data: Endotracheal tube and orogastric tube placement.  PORTABLE CHEST - 1 VIEW  Comparison: Chest radiograph performed 01/13/2013  Findings: The patient's endotracheal tube is seen ending 2 cm above the carina.  An orogastric tube is seen extending below the diaphragm.  There is elevation of the right hemidiaphragm.  The lungs remain somewhat hypoexpanded.  Vascular congestion is again noted, with diffusely increased interstitial markings, likely reflecting pulmonary edema.  No definite pleural effusion or pneumothorax is seen.  The cardiomediastinal silhouette is enlarged.  No acute osseous abnormalities are seen.  IMPRESSION:  1.  Endotracheal tube seen ending 2 cm above the carina. 2.  Orogastric tube noted extending below the diaphragm. 3.  Lungs hypoexpanded; vascular congestion and cardiomegaly again noted, with increased interstitial markings, likely reflecting mild pulmonary edema.   Original Report Authenticated By: Tonia Ghent, M.D.     ASSESSMENT / PLAN:  PULMONARY A: resp failure, possibly aspiration. - began after getting asa  Known OSA/OHS on AutoPAP.  P:   -cont Vent support  -daily SBT   -qhs bipap once extubated  -Bronchodilators.  -f/u CXR -diurese   CARDIOVASCULAR A: HTN, CAD. Possibly fluid overload P:  -cont diurese as bp, Scr tol  -BP control   RENAL A:  No acute renal failure P:   -cont diurese as tol  -f/u chem   GASTROINTESTINAL A:  Morbid obesity P:   -TF  HEMATOLOGIC A:  Chronic anemia - No indication for transfusion for now P:  -f/u cbc   INFECTIOUS A:  No signs of infection  P:   - Monitor off abx. - U/A.  ENDOCRINE A:  DM P:   - SSI  NEUROLOGIC A:  Mental status recovered after intubation P:   - Intermittent sedation   WHITEHEART,KATHRYN, NP 01/19/2013  10:07 AM Pager: [phone] or [phone]  I have personally obtained a history, examined the patient, evaluated laboratory and imaging results, formulated the assessment and plan and placed orders.  CRITICAL CARE: The patient is critically ill with multiple organ systems failure and requires high complexity decision making for assessment and support, frequent evaluation and titration of therapies, application of advanced monitoring technologies and extensive interpretation of multiple databases. Critical Care Time devoted to patient care services described in this note is 45 minutes.   *Care during the described time interval was provided by me and/or other providers on the critical care team. I have reviewed this patient's available data, including medical history, events of note, physical examination and test results as part of my evaluation.  Patient seen and examined, agree with above note.  I dictated the care and orders written for this patient under my direction.  Alyson Reedy, MD [phone]

## 2013-01-19 NOTE — ED Notes (Signed)
PER EMS- pt from [facility], was reportedly given aspirin although she is allergic, started having trouble breathing. Given breathing txt but couldn't tolerate it, so put on CPAP but wasn't able to tolerate it. EMS arrived, pt became unresponsive.

## 2013-01-19 NOTE — ED Notes (Signed)
Dr. Preston Fleeting made aware of Critical lab result of CO2 44

## 2013-01-19 NOTE — Progress Notes (Signed)
INITIAL NUTRITION ASSESSMENT  DOCUMENTATION CODES Per approved criteria  -Morbid Obesity   INTERVENTION: 1. Initiate Promote @ 15 ml/hr (goal rate).  2. 60 ml Prostat QID.  3. MVI daily.  At goal rate, tube feeding regimen will provide 1160 kcal, 142 grams of protein (>100% of needs), and 302 ml of H2O.   TF regimen and current Propofol rate provides: 1344 kcal (67% of needs)   NUTRITION DIAGNOSIS: Inadequate oral intake related to inability to eat as evidenced by NPO status.  Goal: Enteral nutrition to provide 60-70% of estimated calorie needs (22-25 kcals/kg ideal body weight) and 100% of estimated protein needs, based on ASPEN guidelines for permissive underfeeding in critically ill obese individuals.  Monitor:  Vent status, TF tolerance, weight, labs  Reason for Assessment: MD consult for assessment and TF recommendations  62 y.o. female  Admitting Dx: <principal problem not specified>  ASSESSMENT: Patient is currently intubated on ventilator support for respiratory failure and possible aspiration. Pt with known OSA/OHS.   MV: 9.2  Temp:Temp (24hrs), Avg:98.5 F (36.9 C), Min:98.1 F (36.7 C), Max:99 F (37.2 C)  Propofol: 7 ml/hr providing 184 kcal per day from lipid   Height: Ht Readings from Last 1 Encounters:  01/19/13 5\' 4"  (1.626 m)    Weight: Wt Readings from Last 1 Encounters:  01/19/13 308 lb 6.8 oz (139.9 kg)    Ideal Body Weight: 54.5 kg   % Ideal Body Weight: 257%  Wt Readings from Last 10 Encounters:  01/19/13 308 lb 6.8 oz (139.9 kg)  11/12/12 302 lb 4 oz (137.1 kg)  10/18/12 319 lb (144.697 kg)  09/04/11 283 lb 12.8 oz (128.731 kg)  03/23/09 280 lb (127.007 kg)  09/17/07 310 lb 9.6 oz (140.887 kg)    Usual Body Weight: 300  % Usual Body Weight: 100%  BMI:  Body mass index is 52.91 kg/(m^2).  Estimated Nutritional Needs: Kcal: 2019 Protein: >/= 136 grams Fluid: >2 L/day  Skin: stage II wound on sacrum   Diet Order:    NPO   EDUCATION NEEDS: -No education needs identified at this time   Intake/Output Summary (Last 24 hours) at 01/19/13 0903 Last data filed at 01/19/13 0600  Gross per 24 hour  Intake   12.1 ml  Output    660 ml  Net -647.9 ml    Last BM: PTA   Labs:   Recent Labs Lab 01/13/13 0924 01/19/13 0230  NA 139 141  K 4.1 4.1  CL 89* 89*  CO2 >45* 44*  BUN 13 19  CREATININE 0.63 0.85  CALCIUM 9.9 9.5  GLUCOSE 118* 246*    CBG (last 3)  No results found for this basename: GLUCAP,  in the last 72 hours  Scheduled Meds: . ipratropium  0.5 mg Nebulization Q6H   And  . albuterol  2.5 mg Nebulization Q4H  . amLODipine  5 mg Oral Daily  . ARIPiprazole  10 mg Oral Daily  . busPIRone  10 mg Oral BID  . divalproex  500 mg Oral BID  . DULoxetine  60 mg Oral BID  . furosemide  40 mg Intravenous Daily  . heparin  5,000 Units Subcutaneous Q8H  . isosorbide mononitrate  15 mg Oral Daily  . lisinopril  5 mg Oral Daily  . pantoprazole (PROTONIX) IV  40 mg Intravenous Q24H    Continuous Infusions: . propofol 8.339 mcg/kg/min (01/19/13 4098)    Past Medical History  Diagnosis Date  . COPD   . Hypertension   .  Chronic respiratory failure     3 L oxygen  . GERD (gastroesophageal reflux disease)   . Diabetes mellitus   . Obesity, Class III, BMI 40-49.9 (morbid obesity)   . Obstructive sleep apnea on CPAP   . Peripheral vascular disease   . Bipolar disease, chronic   . Lymphedema   . COPD (chronic obstructive pulmonary disease)   . Cellulitis   . Coronary artery disease   . Asthma   . Myocardial infarction   . Vertigo     Past Surgical History  Procedure Laterality Date  . Abdominal hysterectomy    . Cholecystectomy    . Appendectomy    . Tubal ligation      Kendell Bane RD, LDN, CNSC [phone] Pager [phone] After Hours Pager

## 2013-01-19 NOTE — ED Provider Notes (Signed)
History    CSN: [id number] Arrival date & time 01/19/13  0210  First MD Initiated Contact with Patient 01/19/13 0211    Chief complaint: Respiratory distress   (Consider location/radiation/quality/duration/timing/severity/associated sxs/prior Treatment) Patient is a 62 y.o. female presenting with shortness of breath. The history is provided by the EMS personnel. The history is limited by the condition of the patient (Unresponsive).  Shortness of Breath  62 year old female was transferred by ambulance from a nursing care facility because of dyspnea. The caregiver noted acute onset of dyspnea tonight. EMS arrived and the patient was conversant but in distress. They placed her on CPAP but condition deteriorated and she became unresponsive shortly before arriving in the ED. Past Medical History  Diagnosis Date  . COPD   . Hypertension   . Chronic respiratory failure     3 L oxygen  . GERD (gastroesophageal reflux disease)   . Diabetes mellitus   . Obesity, Class III, BMI 40-49.9 (morbid obesity)   . Obstructive sleep apnea on CPAP   . Peripheral vascular disease   . Bipolar disease, chronic   . Lymphedema   . COPD (chronic obstructive pulmonary disease)   . Cellulitis   . Coronary artery disease   . Asthma   . Myocardial infarction   . Vertigo    Past Surgical History  Procedure Laterality Date  . Abdominal hysterectomy    . Cholecystectomy    . Appendectomy    . Tubal ligation     Family History  Problem Relation Age of Onset  . Prostate cancer     History  Substance Use Topics  . Smoking status: Never Smoker   . Smokeless tobacco: Never Used  . Alcohol Use: No   OB History   Grav Para Term Preterm Abortions TAB SAB Ect Mult Living                 Review of Systems  Unable to perform ROS: Patient unresponsive  Respiratory: Positive for shortness of breath.     Allergies  Bee venom; Nyquil cold &; Penicillins; and Ppd  Home Medications   Current  Outpatient Rx  Name  Route  Sig  Dispense  Refill  . albuterol (PROVENTIL) (2.5 MG/3ML) 0.083% nebulizer solution   Nebulization   Take 2.5 mg by nebulization every 6 (six) hours as needed for wheezing.         Marland Kitchen amLODipine (NORVASC) 5 MG tablet   Oral   Take 5 mg by mouth daily.         . ARIPiprazole (ABILIFY) 10 MG tablet   Oral   Take 10 mg by mouth daily. For depressive psychosis         . ascorbic acid (VITAMIN C) 500 MG tablet   Oral   Take 500 mg by mouth 2 (two) times daily.         Marland Kitchen aspirin 325 MG tablet   Oral   Take 325 mg by mouth daily.         . budesonide-formoterol (SYMBICORT) 160-4.5 MCG/ACT inhaler   Inhalation   Inhale 2 puffs into the lungs 2 (two) times daily.         . busPIRone (BUSPAR) 10 MG tablet   Oral   Take 10 mg by mouth 2 (two) times daily.         . calcium-vitamin D (OSCAL WITH D) 500-200 MG-UNIT per tablet   Oral   Take 1 tablet by mouth 2 (two) times  daily.         . cloNIDine (CATAPRES) 0.2 MG tablet   Oral   Take 0.2 mg by mouth 2 (two) times daily.         . divalproex (DEPAKOTE ER) 500 MG 24 hr tablet   Oral   Take 500 mg by mouth 2 (two) times daily.         Marland Kitchen docusate sodium (COLACE) 100 MG capsule   Oral   Take 100 mg by mouth daily.         . DULoxetine (CYMBALTA) 60 MG capsule   Oral   Take 60 mg by mouth 2 (two) times daily.         . feeding supplement (PRO-STAT SUGAR FREE 64) LIQD   Oral   Take 30 mLs by mouth 2 (two) times daily.         . fluticasone (FLONASE) 50 MCG/ACT nasal spray   Nasal   Place 2 sprays into the nose daily.         . folic acid (FOLVITE) 1 MG tablet   Oral   Take 1 mg by mouth daily.         . furosemide (LASIX) 40 MG tablet   Oral   Take 40 mg by mouth daily.         Marland Kitchen gabapentin (NEURONTIN) 400 MG capsule   Oral   Take 400 mg by mouth 3 (three) times daily.         . isosorbide mononitrate (IMDUR) 30 MG 24 hr tablet   Oral   Take 30 mg by  mouth daily.         Marland Kitchen lisinopril (PRINIVIL,ZESTRIL) 10 MG tablet   Oral   Take 10 mg by mouth daily.         . metoCLOPramide (REGLAN) 5 MG tablet   Oral   Take 5 mg by mouth 3 (three) times daily.         . Multiple Vitamins-Minerals (CERTAGEN PO)   Oral   Take 1 tablet by mouth daily.         . nitroGLYCERIN (NITROSTAT) 0.4 MG SL tablet   Sublingual   Place 0.4 mg under the tongue every 5 (five) minutes as needed for chest pain.          Marland Kitchen Olopatadine HCl (PATADAY) 0.2 % SOLN   Both Eyes   Place 1 drop into both eyes daily.          Marland Kitchen oxyCODONE-acetaminophen (PERCOCET/ROXICET) 5-325 MG per tablet   Oral   Take 1 tablet by mouth every 4 (four) hours as needed. For pain   180 tablet   0   . pantoprazole (PROTONIX) 40 MG tablet   Oral   Take 40 mg by mouth 2 (two) times daily.         Marland Kitchen Propylene Glycol (SYSTANE BALANCE OP)   Both Eyes   Place 1 drop into both eyes 2 (two) times daily.         Marland Kitchen senna (SENOKOT) 8.6 MG TABS   Oral   Take 2 tablets by mouth daily.         . simethicone (MYLICON) 80 MG chewable tablet   Oral   Chew 80 mg by mouth every 8 (eight) hours.         Marland Kitchen tiotropium (SPIRIVA) 18 MCG inhalation capsule   Inhalation   Place 18 mcg into inhaler and inhale daily.         Marland Kitchen  vitamin D, CHOLECALCIFEROL, 400 UNITS tablet   Oral   Take 400 Units by mouth daily.          BP 215/104  Pulse 118  Temp(Src) 98.4 F (36.9 C) (Axillary)  Resp 20  SpO2 95% Physical Exam  Nursing note and vitals reviewed.  Obese 62 year old female, with agonal respirations. Vital signs are significant for tachycardia with a rate of 118, and hypertension with blood pressure 215/104. Oxygen saturation is 95%, which is normal, but is well patient is receiving bag-valve-mask ventilations. Head is normocephalic and atraumatic. PERRLA. Oropharynx is clear. Neck is supple without adenopathy or JVD. Back has no presacral edema. Lungs have distant  sounds without obvious rales, wheezes, rhonchi. Chest moves symmetrically. Heart has regular rate and rhythm without murmur. Abdomen is obese without masses or hepatosplenomegaly. Extremities: UNA Boot is present on the left lower leg and is not removed. There is trace edema. Venous stasis changes are present. Skin is warm and dry without rash. Neurologic: She has agonal respirations but no voluntary motor or activity and is unresponsive. There no focal deficits seen.  ED Course  Procedures (including critical care time) INTUBATION Performed by: [facility]  Required items: required blood products, implants, devices, and special equipment available Patient identity confirmed: provided demographic data and hospital-assigned identification number Time out: Immediately prior to procedure a "time out" was called to verify the correct patient, procedure, equipment, support staff and site/side marked as required.  Indications: Acute respiratory failure   Intubation method: Direct Macintosh Laryngoscopy   Preoxygenation: BVM  Sedatives: None  Paralytic: None   Tube Size: 7.5 cuffed  Post-procedure assessment: chest rise and ETCO2 monitor Breath sounds: equal and absent over the epigastrium Tube secured with: ETT holder Chest x-ray interpreted by radiologist and me.  Chest x-ray findings: endotracheal tube in appropriate position  Patient tolerated the procedure well with no immediate complications.    Results for orders placed during the hospital encounter of 01/19/13  CBC WITH DIFFERENTIAL      Result Value Range   WBC 9.8  4.0 - 10.5 K/uL   RBC 3.96  3.87 - 5.11 MIL/uL   Hemoglobin 10.9 (*) 12.0 - 15.0 g/dL   HCT 02.7  25.3 - 66.4 %   MCV 90.9  78.0 - 100.0 fL   MCH 27.5  26.0 - 34.0 pg   MCHC 30.3  30.0 - 36.0 g/dL   RDW 40.3  47.4 - 25.9 %   Platelets 217  150 - 400 K/uL   Neutrophils Relative % 55  43 - 77 %   Neutro Abs 5.4  1.7 - 7.7 K/uL   Lymphocytes Relative 37   12 - 46 %   Lymphs Abs 3.6  0.7 - 4.0 K/uL   Monocytes Relative 6  3 - 12 %   Monocytes Absolute 0.6  0.1 - 1.0 K/uL   Eosinophils Relative 2  0 - 5 %   Eosinophils Absolute 0.2  0.0 - 0.7 K/uL   Basophils Relative 0  0 - 1 %   Basophils Absolute 0.0  0.0 - 0.1 K/uL  BASIC METABOLIC PANEL      Result Value Range   Sodium 141  135 - 145 mEq/L   Potassium 4.1  3.5 - 5.1 mEq/L   Chloride 89 (*) 96 - 112 mEq/L   CO2 44 (*) 19 - 32 mEq/L   Glucose, Bld 246 (*) 70 - 99 mg/dL   BUN 19  6 - 23  mg/dL   Creatinine, Ser 1.61  0.50 - 1.10 mg/dL   Calcium 9.5  8.4 - 09.6 mg/dL   GFR calc non Af Amer 71 (*) >90 mL/min   GFR calc Af Amer 83 (*) >90 mL/min  PRO B NATRIURETIC PEPTIDE      Result Value Range   Pro B Natriuretic peptide (BNP) 1633.0 (*) 0 - 125 pg/mL  TROPONIN I      Result Value Range   Troponin I <0.30  <0.30 ng/mL  URINALYSIS, ROUTINE W REFLEX MICROSCOPIC      Result Value Range   Color, Urine YELLOW  YELLOW   APPearance CLOUDY (*) CLEAR   Specific Gravity, Urine 1.020  1.005 - 1.030   pH 6.0  5.0 - 8.0   Glucose, UA NEGATIVE  NEGATIVE mg/dL   Hgb urine dipstick TRACE (*) NEGATIVE   Bilirubin Urine NEGATIVE  NEGATIVE   Ketones, ur NEGATIVE  NEGATIVE mg/dL   Protein, ur >045 (*) NEGATIVE mg/dL   Urobilinogen, UA 0.2  0.0 - 1.0 mg/dL   Nitrite NEGATIVE  NEGATIVE   Leukocytes, UA NEGATIVE  NEGATIVE  URINE MICROSCOPIC-ADD ON      Result Value Range   Squamous Epithelial / LPF RARE  RARE   WBC, UA 0-2  <3 WBC/hpf   RBC / HPF 11-20  <3 RBC/hpf   Bacteria, UA RARE  RARE   Casts HYALINE CASTS (*) NEGATIVE  CG4 I-STAT (LACTIC ACID)      Result Value Range   Lactic Acid, Venous 3.73 (*) 0.5 - 2.2 mmol/L   Dg Chest Portable 1 View  01/19/2013   *RADIOLOGY REPORT*  Clinical Data: Endotracheal tube and orogastric tube placement.  PORTABLE CHEST - 1 VIEW  Comparison: Chest radiograph performed 01/13/2013  Findings: The patient's endotracheal tube is seen ending 2 cm above the  carina.  An orogastric tube is seen extending below the diaphragm.  There is elevation of the right hemidiaphragm.  The lungs remain somewhat hypoexpanded.  Vascular congestion is again noted, with diffusely increased interstitial markings, likely reflecting pulmonary edema.  No definite pleural effusion or pneumothorax is seen.  The cardiomediastinal silhouette is enlarged.  No acute osseous abnormalities are seen.  IMPRESSION:  1.  Endotracheal tube seen ending 2 cm above the carina. 2.  Orogastric tube noted extending below the diaphragm. 3.  Lungs hypoexpanded; vascular congestion and cardiomegaly again noted, with increased interstitial markings, likely reflecting mild pulmonary edema.   Original Report Authenticated By: Tonia Ghent, M.D.     Date: 01/19/2013  Rate: 114  Rhythm: sinus tachycardia  QRS Axis: normal  Intervals: normal  ST/T Wave abnormalities: normal  Conduction Disutrbances:none  Narrative Interpretation: Sinus tachycardia, left atrial hypertrophy, old anteroseptal myocardial infarction. When compared with ECG of 01/13/2013, right axis deviation is no longer present.  Old EKG Reviewed: changes noted  1. Acute-on-chronic respiratory failure   2. CHF (congestive heart failure), acute on chronic, systolic   3. Anemia   4. Elevated lactic acid level    CRITICAL CARE Performed by: WUJWJ,XBJYN Total critical care time: 65 minutes Critical care time was exclusive of separately billable procedures and treating other patients. Critical care was necessary to treat or prevent imminent or life-threatening deterioration. Critical care was time spent personally by me on the following activities: development of treatment plan with patient and/or surrogate as well as nursing, discussions with consultants, evaluation of patient's response to treatment, examination of patient, obtaining history from patient or surrogate, ordering and performing  treatments and interventions, ordering and  review of laboratory studies, ordering and review of radiographic studies, pulse oximetry and re-evaluation of patient's condition.  MDM  Acute respiratory failure. Patient is noted to be hypertensive. She needs emergent intubation.  Once intubated, she was noted to have frothy, blood tinged secretions consistent with CHF. Treatment for CHF was initiated with intravenous furosemide. Hypertension is treated with IV nitroglycerin. Old records are reviewed and she has known history of acute on chronic respiratory failure and COPD and CHF.  After intubation and sedation with Ativan, blood pressure has come down. Initially, order was placed for nitroglycerin intravenously a bit better was rescinded 1 blood pressure dropped. She's remained hemodynamically stable. Case is discussed with Dr. Darrick Penna of pulmonary critical care service who agrees to admit the patient.  Dione Booze, MD 01/19/13 [phone]

## 2013-01-19 NOTE — Progress Notes (Signed)
CRITICAL VALUE ALERT  Critical value received:  CO2 >45  Date of notification:  01/19/13  Time of notification:  2053  Critical value read back:yes  Nurse who received alert:  Dustin Flock RN  MD notified (1st page):  01/19/13  Time of first page:  2055  MD notified (2nd page):  Time of second page:  Responding MD: Dr Marin Shutter  Time MD responded:  2055

## 2013-01-19 NOTE — H&P (Signed)
PULMONARY  / CRITICAL CARE MEDICINE  Name: [name] MRN: [id number] DOB: [birth year]    ADMISSION DATE:  01/19/2013 CONSULTATION DATE:    REFERRING MD :   PRIMARY SERVICE: PCCM  CHIEF COMPLAINT:  resp failure  BRIEF PATIENT DESCRIPTION: 62 yo NH resident with COPD, OSA, OHS (on BiPAP / oxygen), developed resp distress after getting aspirin, failed CPAP and became unresponsive. Intubated. Has leg edema.   SIGNIFICANT EVENTS / STUDIES:  Intubated  LINES / TUBES: Foley 7/19 >>> OGT 7/19 >>>  CULTURES: None  ANTIBIOTICS: None  HISTORY OF PRESENT ILLNESS:  Patient cannot give history at the moment. 72F nursing home resident with multiple medical issues including: morbid obesity, OSA on AutoPAP, OHS, ??COPD, DM, HTN. Was getting her aspirin in NH then suddenly developed dyspnea. Unclear whether she aspirated. She was reported to be wheezing. Was put on CPAP to no avail, became unresponsive. Intubated in ED. No report of fever, chills, productive cough, N/V.  PAST MEDICAL HISTORY :  Past Medical History  Diagnosis Date  . COPD   . Hypertension   . Chronic respiratory failure     3 L oxygen  . GERD (gastroesophageal reflux disease)   . Diabetes mellitus   . Obesity, Class III, BMI 40-49.9 (morbid obesity)   . Obstructive sleep apnea on CPAP   . Peripheral vascular disease   . Bipolar disease, chronic   . Lymphedema   . COPD (chronic obstructive pulmonary disease)   . Cellulitis   . Coronary artery disease   . Asthma   . Myocardial infarction   . Vertigo    Past Surgical History  Procedure Laterality Date  . Abdominal hysterectomy    . Cholecystectomy    . Appendectomy    . Tubal ligation     Prior to Admission medications   Medication Sig Start Date End Date Taking? Authorizing Provider  albuterol (PROVENTIL) (2.5 MG/3ML) 0.083% nebulizer solution Take 2.5 mg by nebulization every 6 (six) hours as needed for wheezing.   Yes Historical Provider, MD   amLODipine (NORVASC) 5 MG tablet Take 5 mg by mouth daily.   Yes Historical Provider, MD  ARIPiprazole (ABILIFY) 10 MG tablet Take 10 mg by mouth daily. For depressive psychosis   Yes Historical Provider, MD  ascorbic acid (VITAMIN C) 500 MG tablet Take 500 mg by mouth 2 (two) times daily.   Yes Historical Provider, MD  aspirin 325 MG tablet Take 325 mg by mouth daily.   Yes Historical Provider, MD  budesonide-formoterol (SYMBICORT) 160-4.5 MCG/ACT inhaler Inhale 2 puffs into the lungs 2 (two) times daily.   Yes Historical Provider, MD  busPIRone (BUSPAR) 10 MG tablet Take 10 mg by mouth 2 (two) times daily.   Yes Historical Provider, MD  calcium-vitamin D (OSCAL WITH D) 500-200 MG-UNIT per tablet Take 1 tablet by mouth 2 (two) times daily.   Yes Historical Provider, MD  cloNIDine (CATAPRES) 0.2 MG tablet Take 0.2 mg by mouth 2 (two) times daily.   Yes Historical Provider, MD  divalproex (DEPAKOTE ER) 500 MG 24 hr tablet Take 500 mg by mouth 2 (two) times daily.   Yes Historical Provider, MD  docusate sodium (COLACE) 100 MG capsule Take 100 mg by mouth daily.   Yes Historical Provider, MD  DULoxetine (CYMBALTA) 60 MG capsule Take 60 mg by mouth 2 (two) times daily.   Yes Historical Provider, MD  feeding supplement (PRO-STAT SUGAR FREE 64) LIQD Take 30 mLs by mouth 2 (two) times  daily.   Yes Historical Provider, MD  fluticasone (FLONASE) 50 MCG/ACT nasal spray Place 2 sprays into the nose daily.   Yes Historical Provider, MD  folic acid (FOLVITE) 1 MG tablet Take 1 mg by mouth daily.   Yes Historical Provider, MD  furosemide (LASIX) 40 MG tablet Take 40 mg by mouth daily.   Yes Historical Provider, MD  gabapentin (NEURONTIN) 400 MG capsule Take 400 mg by mouth 3 (three) times daily.   Yes Historical Provider, MD  isosorbide mononitrate (IMDUR) 30 MG 24 hr tablet Take 30 mg by mouth daily.   Yes Historical Provider, MD  lisinopril (PRINIVIL,ZESTRIL) 10 MG tablet Take 10 mg by mouth daily.   Yes  Historical Provider, MD  metoCLOPramide (REGLAN) 5 MG tablet Take 5 mg by mouth 3 (three) times daily.   Yes Historical Provider, MD  Multiple Vitamins-Minerals (CERTAGEN PO) Take 1 tablet by mouth daily.   Yes Historical Provider, MD  nitroGLYCERIN (NITROSTAT) 0.4 MG SL tablet Place 0.4 mg under the tongue every 5 (five) minutes as needed for chest pain.    Yes Historical Provider, MD  Olopatadine HCl (PATADAY) 0.2 % SOLN Place 1 drop into both eyes daily.    Yes Historical Provider, MD  oxyCODONE-acetaminophen (PERCOCET/ROXICET) 5-325 MG per tablet Take 1 tablet by mouth every 4 (four) hours as needed. For pain 11/14/12  Yes Mahima Pandey, MD  pantoprazole (PROTONIX) 40 MG tablet Take 40 mg by mouth 2 (two) times daily.   Yes Historical Provider, MD  Propylene Glycol (SYSTANE BALANCE OP) Place 1 drop into both eyes 2 (two) times daily.   Yes Historical Provider, MD  senna (SENOKOT) 8.6 MG TABS Take 2 tablets by mouth daily.   Yes Historical Provider, MD  simethicone (MYLICON) 80 MG chewable tablet Chew 80 mg by mouth every 8 (eight) hours.   Yes Historical Provider, MD  tiotropium (SPIRIVA) 18 MCG inhalation capsule Place 18 mcg into inhaler and inhale daily.   Yes Historical Provider, MD  vitamin D, CHOLECALCIFEROL, 400 UNITS tablet Take 400 Units by mouth daily.   Yes Historical Provider, MD   Allergies  Allergen Reactions  . Bee Venom Other (See Comments)    Unknown  . Nyquil Cold & (Dm-Doxylamine-Acetaminophen) Other (See Comments)    Unknown   . Penicillins Other (See Comments)    unknown  . Ppd (Tuberculin Purified Protein Derivative) Other (See Comments)    unknown    FAMILY HISTORY:  Family History  Problem Relation Age of Onset  . Prostate cancer     SOCIAL HISTORY:  reports that she has never smoked. She has never used smokeless tobacco. She reports that she does not drink alcohol or use illicit drugs.  REVIEW OF SYSTEMS:  Has pain "all over"  SUBJECTIVE:   VITAL  SIGNS: Temp:  [98.4 F (36.9 C)] 98.4 F (36.9 C) (07/19 0212) Pulse Rate:  [81-118] 81 (07/19 0400) Resp:  [14-28] 28 (07/19 0400) BP: (105-215)/(45-120) 134/120 mmHg (07/19 0400) SpO2:  [95 %-100 %] 100 % (07/19 0400) FiO2 (%):  [70 %-100 %] 70 % (07/19 0245) Weight:  [140 kg (308 lb 10.3 oz)] 140 kg (308 lb 10.3 oz) (07/19 0233) HEMODYNAMICS:   VENTILATOR SETTINGS: Vent Mode:  [-] PRVC FiO2 (%):  [70 %-100 %] 70 % Set Rate:  [14 bmp-28 bmp] 28 bmp Vt Set:  [450 mL-500 mL] 450 mL PEEP:  [5 cmH20] 5 cmH20 Plateau Pressure:  [23 cmH20-27 cmH20] 23 cmH20 INTAKE / OUTPUT: Intake/Output  07/18 0701 - 07/19 0700   Urine (mL/kg/hr) 30   Emesis/NG output 5   Total Output 35   Net -35         PHYSICAL EXAMINATION: General:  Intubated, sedated but follows commands Neuro:  No focal weakness, no facial droop, no preferential gaze HEENT:  Atraumatic, PERRLA, no LN, trachea midline Cardiovascular:  RRR, grade 2/6 systolic murmur Lungs:  Coarse breath sounds, mild expiratory wheeze bilaterally Abdomen:  Globular, soft, has direct tenderness all over and also direct tenderness on her chest Musculoskeletal:  Bilateral leg edema Skin:  Chronic skin changes on the lower ext  LABS:  Recent Labs Lab 01/13/13 0924 01/19/13 0230 01/19/13 0231 01/19/13 0241  HGB 10.2* 10.9*  --   --   WBC 6.3 9.8  --   --   PLT 183 217  --   --   NA 139 141  --   --   K 4.1 4.1  --   --   CL 89* 89*  --   --   CO2 >45* 44*  --   --   GLUCOSE 118* 246*  --   --   BUN 13 19  --   --   CREATININE 0.63 0.85  --   --   CALCIUM 9.9 9.5  --   --   LATICACIDVEN  --   --   --  3.73*  TROPONINI  --   --  <0.30  --   PROBNP  --   --  1633.0*  --    No results found for this basename: GLUCAP,  in the last 168 hours  CXR: 7/19: 1. Endotracheal tube seen ending 2 cm above the carina.  2. Orogastric tube noted extending below the diaphragm.  3. Lungs hypoexpanded; vascular congestion and cardiomegaly  again noted, with increased interstitial markings, likely reflecting mild pulmonary edema.   ASSESSMENT / PLAN:  PULMONARY A: resp failure, possibly aspiration. Known OSA/OHS on AutoPAP. No signs of PNA for now P:   Vent support and wean as tolerated. Will need AutoPAP when extubated. Bronchodilators. Repeat CXR  CARDIOVASCULAR A: HTN, CAD. Possibly fluid overload P:  BP control, restart home meds. Diurese. No ASA for now as she has an "allergy" to it, unclear if this is true.  RENAL A:  No acute renal failure P:   Diurese. Strict I/O  GASTROINTESTINAL A:  Morbid obesity P:   Nutrition consult. PPI.  HEMATOLOGIC A:  Chronic anemia P:  No indication for transfusion for now  INFECTIOUS A:  No signs of PNA for now P:   Monitor  ENDOCRINE A:  DM P:   SSI  NEUROLOGIC A:  Mental status recovered after intubation P:   Monitor. Mild sedation.  TODAY'S SUMMARY: Developed resp distress after getting aspirin, failed CPAP, became unresponsive, got intubated.  I have personally obtained a history, examined the patient, evaluated laboratory and imaging results, formulated the assessment and plan and placed orders. CRITICAL CARE: The patient is critically ill with multiple organ systems failure and requires high complexity decision making for assessment and support, frequent evaluation and titration of therapies, application of advanced monitoring technologies and extensive interpretation of multiple databases. Critical Care Time devoted to patient care services described in this note is 35 minutes.    Pulmonary and Critical Care Medicine [facility] Pager: [phone]  01/19/2013, 4:08 AM

## 2013-01-19 NOTE — Progress Notes (Signed)
CCM aware of the CO2 >45.

## 2013-01-20 ENCOUNTER — Inpatient Hospital Stay (HOSPITAL_COMMUNITY): Payer: Medicaid Other

## 2013-01-20 DIAGNOSIS — G9340 Encephalopathy, unspecified: Secondary | ICD-10-CM

## 2013-01-20 DIAGNOSIS — R0902 Hypoxemia: Secondary | ICD-10-CM

## 2013-01-20 DIAGNOSIS — J9620 Acute and chronic respiratory failure, unspecified whether with hypoxia or hypercapnia: Principal | ICD-10-CM

## 2013-01-20 DIAGNOSIS — D649 Anemia, unspecified: Secondary | ICD-10-CM

## 2013-01-20 LAB — BASIC METABOLIC PANEL
BUN: 37 mg/dL — ABNORMAL HIGH (ref 6–23)
BUN: 35 mg/dL — ABNORMAL HIGH (ref 6–23)
CO2: 45 mEq/L (ref 19–32)
Calcium: 10 mg/dL (ref 8.4–10.5)
Chloride: 88 mEq/L — ABNORMAL LOW (ref 96–112)
Creatinine, Ser: 0.91 mg/dL (ref 0.50–1.10)
GFR calc Af Amer: 65 mL/min — ABNORMAL LOW (ref >90)
GFR calc non Af Amer: 56 mL/min — ABNORMAL LOW (ref >90)
Glucose, Bld: 122 mg/dL — ABNORMAL HIGH (ref 70–99)
Potassium: 3.1 mEq/L — ABNORMAL LOW (ref 3.5–5.1)
Potassium: 3.5 mEq/L (ref 3.5–5.1)
Sodium: 141 mEq/L (ref 135–145)

## 2013-01-20 LAB — CBC
Hemoglobin: 10.1 g/dL — ABNORMAL LOW (ref 12.0–15.0)
MCHC: 31.3 g/dL (ref 30.0–36.0)
Platelets: 207 10*3/uL (ref 150–400)
RBC: 3.77 MIL/uL — ABNORMAL LOW (ref 3.87–5.11)

## 2013-01-20 LAB — GLUCOSE, CAPILLARY
Glucose-Capillary: 105 mg/dL — ABNORMAL HIGH (ref 70–99)
Glucose-Capillary: 110 mg/dL — ABNORMAL HIGH (ref 70–99)
Glucose-Capillary: 135 mg/dL — ABNORMAL HIGH (ref 70–99)
Glucose-Capillary: 145 mg/dL — ABNORMAL HIGH (ref 70–99)

## 2013-01-20 LAB — URINE CULTURE: Colony Count: NO GROWTH

## 2013-01-20 MED ORDER — PANTOPRAZOLE SODIUM 40 MG PO PACK
40.0000 mg | PACK | Freq: Every day | ORAL | Status: DC
  Filled 2013-01-20: qty 20

## 2013-01-20 MED ORDER — POTASSIUM CHLORIDE 20 MEQ/15ML (10%) PO LIQD
ORAL | Status: AC
  Filled 2013-01-20: qty 30

## 2013-01-20 MED ORDER — FUROSEMIDE 10 MG/ML IJ SOLN
40.0000 mg | Freq: Two times a day (BID) | INTRAMUSCULAR | Status: AC
  Administered 2013-01-20 – 2013-01-21 (×2): 40 mg via INTRAVENOUS
  Filled 2013-01-20 (×2): qty 4

## 2013-01-20 MED ORDER — ADULT MULTIVITAMIN LIQUID CH
5.0000 mL | Freq: Every day | ORAL | Status: DC
  Administered 2013-01-21 – 2013-01-23 (×3): 5 mL
  Filled 2013-01-20 (×3): qty 5

## 2013-01-20 MED ORDER — POTASSIUM CHLORIDE 20 MEQ/15ML (10%) PO LIQD
40.0000 meq | Freq: Once | ORAL | Status: AC
  Administered 2013-01-20: 40 meq
  Filled 2013-01-20: qty 30

## 2013-01-20 MED ORDER — POTASSIUM CHLORIDE 20 MEQ/15ML (10%) PO LIQD
40.0000 meq | Freq: Four times a day (QID) | ORAL | Status: AC
  Administered 2013-01-20 (×2): 40 meq
  Filled 2013-01-20 (×2): qty 30

## 2013-01-20 NOTE — Progress Notes (Signed)
PULMONARY  / CRITICAL CARE MEDICINE  Name: [name] MRN: [id number] DOB: Jan 30, 1951    ADMISSION DATE:  01/19/2013 CONSULTATION DATE:    REFERRING MD :   PRIMARY SERVICE: PCCM  CHIEF COMPLAINT:  resp failure  BRIEF PATIENT DESCRIPTION: 62 yo [place] resident with COPD, OSA, OHS (on BiPAP / oxygen), developed resp distress at SNF, failed CPAP and became unresponsive. Intubated. Has leg edema.   SIGNIFICANT EVENTS / STUDIES:  7/19>> Intubated  LINES / TUBES: ETT 7/19>>> Foley 7/19 >>> OGT 7/19 >>>  CULTURES: None  ANTIBIOTICS: None  SUBJECTIVE/Overnight:   Diuresed well overnight.  More awake this am.  Denies SOB.   VITAL SIGNS: Temp:  [98 F (36.7 C)-102 F (38.9 C)] 99.9 F (37.7 C) (07/20 0800) Pulse Rate:  [50-104] 89 (07/20 0800) Resp:  [17-25] 18 (07/20 0800) BP: (119-162)/(47-109) 138/76 mmHg (07/20 0800) SpO2:  [97 %-100 %] 100 % (07/20 0800) FiO2 (%):  [40 %-50 %] 40 % (07/20 0805) Weight:  [297 lb 13.5 oz (135.1 kg)] 297 lb 13.5 oz (135.1 kg) (07/20 0400) HEMODYNAMICS:   VENTILATOR SETTINGS: Vent Mode:  [-] CPAP;PSV FiO2 (%):  [40 %-50 %] 40 % Set Rate:  [20 bmp] 20 bmp Vt Set:  [450 mL] 450 mL PEEP:  [5 cmH20] 5 cmH20 Pressure Support:  [10 cmH20] 10 cmH20 Plateau Pressure:  [20 cmH20-28 cmH20] 20 cmH20 INTAKE / OUTPUT: Intake/Output     07/19 0701 - 07/20 0700 07/20 0701 - 07/21 0700   I.V. (mL/kg) 468.2 (3.5) 15 (0.1)   NG/GT 715 15   Total Intake(mL/kg) 1183.2 (8.8) 30 (0.2)   Urine (mL/kg/hr) 5755 (1.8) 1300 (5.4)   Emesis/NG output     Total Output 5755 1300   Net -4571.8 -1270          PHYSICAL EXAMINATION: General:  Intubated, NAD Neuro:  Awake, alert, follows commands, MAE, gen weakness  HEENT:  Atraumatic, PERRLA, no LN, trachea midline Cardiovascular:  RRR, grade 2/6 systolic murmur Lungs:  Coarse breath sounds, mild expiratory wheeze bilaterally Abdomen:  obese, soft, mild tenderness diffusely  Musculoskeletal:  Bilateral  leg edema Skin:  Chronic skin changes on the lower ext  LABS:  Recent Labs Lab 01/19/13 0230 01/19/13 1000 01/20/13 0710  HGB 10.9* 10.0* 10.1*  HCT 36.0 32.8* 32.3*  WBC 9.8 9.1 8.6  PLT 217 199 207    Recent Labs Lab 01/13/13 0924 01/19/13 0230 01/19/13 1000 01/19/13 1924 01/20/13 0710  NA 139 141 141 140 141  K 4.1 4.1 4.2 3.7 3.1*  CL 89* 89* 88* 87* 89*  CO2 >45* 44* >45* >45* 44*  GLUCOSE 118* 246* 109* 124* 115*  BUN 13 19 23  29* 37*  CREATININE 0.63 0.85 0.86 0.92 1.04  CALCIUM 9.9 9.5 10.0 9.6 10.0  MG  --   --  1.7  --   --   PHOS  --   --  2.1*  --   --      Recent Labs Lab 01/19/13 1523 01/19/13 1943 01/19/13 2359 01/20/13 0344 01/20/13 0716  GLUCAP 125* 148* 105* 122* 119*    CXR: Dg Chest Portable 1 View  01/19/2013   *RADIOLOGY REPORT*  Clinical Data: Endotracheal tube and orogastric tube placement.  PORTABLE CHEST - 1 VIEW  Comparison: Chest radiograph performed 01/13/2013  Findings: The patient's endotracheal tube is seen ending 2 cm above the carina.  An orogastric tube is seen extending below the diaphragm.  There is elevation of the right  hemidiaphragm.  The lungs remain somewhat hypoexpanded.  Vascular congestion is again noted, with diffusely increased interstitial markings, likely reflecting pulmonary edema.  No definite pleural effusion or pneumothorax is seen.  The cardiomediastinal silhouette is enlarged.  No acute osseous abnormalities are seen.  IMPRESSION:  1.  Endotracheal tube seen ending 2 cm above the carina. 2.  Orogastric tube noted extending below the diaphragm. 3.  Lungs hypoexpanded; vascular congestion and cardiomegaly again noted, with increased interstitial markings, likely reflecting mild pulmonary edema.   Original Report Authenticated By: Tonia Ghent, M.D.     ASSESSMENT / PLAN:  PULMONARY A: resp failure, possibly aspiration. - began after getting asa  Known OSA/OHS on AutoPAP.  P:   -cont Vent support - tol some  PS wean this am, not sure extubateable today  -daily SBT  -qhs bipap once extubated  -Bronchodilators.  -f/u CXR -cont diurese    CARDIOVASCULAR A: HTN, CAD. Fluid overload P:  -cont diurese as bp, Scr tol  -BP control   RENAL A:  No acute renal failure P:   -cont diurese as tol  -f/u chem   GASTROINTESTINAL A:  Morbid obesity P:   -TF  HEMATOLOGIC A:  Chronic anemia - No indication transfusion for now P:  -f/u cbc   INFECTIOUS A:  No signs of infection  P:   - Cont monitor off abx.  ENDOCRINE A:  DM P:   - SSI  NEUROLOGIC A:  Mental status recovered after intubation P:   - change to intermittent sedation    WHITEHEART,KATHRYN, NP 01/20/2013  8:47 AM Pager: [phone] or [phone]  CRITICAL CARE: The patient is critically ill with multiple organ systems failure and requires high complexity decision making for assessment and support, frequent evaluation and titration of therapies, application of advanced monitoring technologies and extensive interpretation of multiple databases. Critical Care Time devoted to patient care services described in this note is 35 minutes.   *Care during the described time interval was provided by me and/or other providers on the critical care team. I have reviewed this patient's available data, including medical history, events of note, physical examination and test results as part of my evaluation.  Alyson Reedy, M.D. [facility] Pulmonary/Critical Care Medicine. Pager: [phone]. After hours pager: [phone].

## 2013-01-20 NOTE — Consult Note (Signed)
WOC consult Note Reason for Consult: Left lateral LE ulceration, now healed (reepithelialized, but lacking return of pigment) Wound type:Venous insufficiency Pressure Ulcer POA: No Measurement:6.5cm x 6cm, no depth Wound bed:as described above. Drainage (amount, consistency, odor) None Periwound:hemosiderin staining Dressing procedure/placement/frequency: We will place a soft silicone foam dressing over the previously injured area and follow the same dressing protocol as was in place from her extended care facility, i.e., wrapping the LE with Kerlix from toe to knee (heel inclusive) and following that with a similarly wrapped Coban for light compression. Changed once weekly. I will not follow, but will remain available as needed.  Please re-consult if need arises. Thanks, Ladona Mow, MSN, RN, [facility], CWOCN [phone])

## 2013-01-20 NOTE — Procedures (Signed)
Extubation Procedure Note  Patient Details:   Name: [name] DOB: 15-Jun-1951 MRN: [id number]   Airway Documentation:     Evaluation  O2 sats: stable throughout Complications: No apparent complications Patient did tolerate procedure well. Bilateral Breath Sounds: Diminished Suctioning: Oral Yes  Pt  Extubated per MD order.  Pt tolerated well.  Pt currently on 6l [place] with sats 97%.  RT will continue to monitor.  Closson, Terie Purser 01/20/2013, 11:52 AM

## 2013-01-21 ENCOUNTER — Inpatient Hospital Stay (HOSPITAL_COMMUNITY): Payer: Medicaid Other

## 2013-01-21 DIAGNOSIS — I5031 Acute diastolic (congestive) heart failure: Secondary | ICD-10-CM | POA: Diagnosis present

## 2013-01-21 DIAGNOSIS — I509 Heart failure, unspecified: Secondary | ICD-10-CM

## 2013-01-21 LAB — GLUCOSE, CAPILLARY
Glucose-Capillary: 104 mg/dL — ABNORMAL HIGH (ref 70–99)
Glucose-Capillary: 104 mg/dL — ABNORMAL HIGH (ref 70–99)
Glucose-Capillary: 104 mg/dL — ABNORMAL HIGH (ref 70–99)
Glucose-Capillary: 117 mg/dL — ABNORMAL HIGH (ref 70–99)
Glucose-Capillary: 121 mg/dL — ABNORMAL HIGH (ref 70–99)
Glucose-Capillary: 125 mg/dL — ABNORMAL HIGH (ref 70–99)
Glucose-Capillary: 143 mg/dL — ABNORMAL HIGH (ref 70–99)

## 2013-01-21 LAB — BASIC METABOLIC PANEL
CO2: 45 mEq/L (ref 19–32)
Chloride: 90 mEq/L — ABNORMAL LOW (ref 96–112)
Glucose, Bld: 108 mg/dL — ABNORMAL HIGH (ref 70–99)
Potassium: 3.3 mEq/L — ABNORMAL LOW (ref 3.5–5.1)
Sodium: 142 mEq/L (ref 135–145)

## 2013-01-21 LAB — CBC
Hemoglobin: 10.4 g/dL — ABNORMAL LOW (ref 12.0–15.0)
MCH: 26.6 pg (ref 26.0–34.0)
RBC: 3.91 MIL/uL (ref 3.87–5.11)
WBC: 10 10*3/uL (ref 4.0–10.5)

## 2013-01-21 MED ORDER — POTASSIUM CHLORIDE 20 MEQ/15ML (10%) PO LIQD
20.0000 meq | Freq: Once | ORAL | Status: AC
  Administered 2013-01-21: 20 meq via ORAL

## 2013-01-21 MED ORDER — FUROSEMIDE 40 MG PO TABS
40.0000 mg | ORAL_TABLET | Freq: Two times a day (BID) | ORAL | Status: DC
  Administered 2013-01-21 – 2013-01-23 (×4): 40 mg via ORAL
  Filled 2013-01-21 (×6): qty 1

## 2013-01-21 MED ORDER — PANTOPRAZOLE SODIUM 40 MG PO TBEC
40.0000 mg | DELAYED_RELEASE_TABLET | Freq: Every day | ORAL | Status: DC
  Administered 2013-01-21 – 2013-01-23 (×3): 40 mg via ORAL
  Filled 2013-01-21 (×3): qty 1

## 2013-01-21 MED ORDER — DULOXETINE HCL 60 MG PO CPEP
60.0000 mg | ORAL_CAPSULE | Freq: Two times a day (BID) | ORAL | Status: DC
  Administered 2013-01-21 – 2013-01-23 (×5): 60 mg via ORAL
  Filled 2013-01-21 (×6): qty 1

## 2013-01-21 MED ORDER — GLUCERNA SHAKE PO LIQD
237.0000 mL | Freq: Every day | ORAL | Status: DC
  Administered 2013-01-21 – 2013-01-22 (×2): 237 mL via ORAL

## 2013-01-21 MED ORDER — POTASSIUM CHLORIDE 10 MEQ/100ML IV SOLN
10.0000 meq | INTRAVENOUS | Status: DC
  Administered 2013-01-21 (×2): 10 meq via INTRAVENOUS
  Filled 2013-01-21: qty 200

## 2013-01-21 MED ORDER — DIVALPROEX SODIUM ER 500 MG PO TB24
500.0000 mg | ORAL_TABLET | Freq: Two times a day (BID) | ORAL | Status: DC
  Administered 2013-01-21 – 2013-01-23 (×5): 500 mg via ORAL
  Filled 2013-01-21 (×6): qty 1

## 2013-01-21 MED ORDER — INSULIN ASPART 100 UNIT/ML ~~LOC~~ SOLN
0.0000 [IU] | Freq: Three times a day (TID) | SUBCUTANEOUS | Status: DC
  Administered 2013-01-21 – 2013-01-23 (×3): 2 [IU] via SUBCUTANEOUS

## 2013-01-21 NOTE — Progress Notes (Signed)
NUTRITION FOLLOW UP  Intervention:    1. Add soft diet with pureed meats to CHO Modified diet order.   2. Glucerna Shake po daily, each supplement provides 220 kcal and 10 grams of protein.  Nutrition Dx:   Inadequate oral intake related to inability to eat as evidenced by NPO status; resloved   New Nutrition Dx:  Increased nutrient needs related to wound as evidenced by estimated needs.   Goal:  Enteral nutrition to provide 60-70% of estimated calorie needs (22-25 kcals/kg ideal body weight) and 100% of estimated protein needs, based on ASPEN guidelines for permissive underfeeding in critically ill obese individuals; NA  New Goal:  Pt to meet >/= 90% of their estimated nutrition needs   Monitor:  PO intake, wounds, weight, labs    Assessment:   Pt from SNF admitted for respiratory distress requiring intubated. Pt extubated 7/20. Pt passed swallow evaluation and is no on PO diet.  Per pt she has a good appetite at SNF but does consume Boost supplement. Pt states that she has no teeth and no dentures therefore she eats a soft diet with Pureed meats at the SNF. Pt has not yet had a meal.   Height: Ht Readings from Last 1 Encounters:  01/19/13 5\' 4"  (1.626 m)    Weight Status:   Wt Readings from Last 1 Encounters:  01/21/13 287 lb 0.6 oz (130.2 kg)  Admission weight 308 lb   Re-estimated needs:  Kcal: 2000-2100 Protein: 100-120 grams Fluid: > 2 L/day  Skin: stage II on sacrum   Diet Order: Carb Control   Intake/Output Summary (Last 24 hours) at 01/21/13 1154 Last data filed at 01/21/13 0909  Gross per 24 hour  Intake    670 ml  Output   5576 ml  Net  -4906 ml    Last BM: 7/20   Labs:   Recent Labs Lab 01/19/13 1000  01/20/13 0710 01/20/13 1556 01/21/13 0510  NA 141  < > 141 142 142  K 4.2  < > 3.1* 3.5 3.3*  CL 88*  < > 89* 88* 90*  CO2 >45*  < > 44* 45* 45*  BUN 23  < > 37* 35* 28*  CREATININE 0.86  < > 1.04 0.91 0.85  CALCIUM 10.0  < > 10.0  10.4 9.6  MG 1.7  --   --   --   --   PHOS 2.1*  --   --   --   --   GLUCOSE 109*  < > 115* 122* 108*  < > = values in this interval not displayed.  CBG (last 3)   Recent Labs  01/20/13 2346 01/21/13 0355 01/21/13 0741  GLUCAP 104* 104* 104*   Lab Results  Component Value Date   HGBA1C  Value: 6.0 (NOTE)                                                                       According to the ADA Clinical Practice Recommendations for 2011, when HbA1c is used as a screening test:   >=6.5%   Diagnostic of Diabetes Mellitus           (if abnormal result  is confirmed)  5.7-6.4%   Increased risk  of developing Diabetes Mellitus  References:Diagnosis and Classification of Diabetes Mellitus,Diabetes Care,2011,34(Suppl 1):S62-S69 and Standards of Medical Care in         Diabetes - 2011,Diabetes Care,2011,34  (Suppl 1):S11-S61.* 08/09/2010   Scheduled Meds: . albuterol  2.5 mg Nebulization Q6H  . amLODipine  5 mg Oral Daily  . ARIPiprazole  10 mg Oral Daily  . busPIRone  10 mg Oral BID  . divalproex  500 mg Oral BID  . DULoxetine  60 mg Oral BID  . furosemide  40 mg Oral BID  . heparin  5,000 Units Subcutaneous Q8H  . insulin aspart  0-15 Units Subcutaneous TID WC  . ipratropium  0.5 mg Nebulization Q6H  . isosorbide mononitrate  15 mg Oral Daily  . lisinopril  5 mg Oral Daily  . multivitamin  5 mL Per Tube Daily  . pantoprazole  40 mg Oral Daily  . potassium chloride  20 mEq Oral Once    Continuous Infusions:   Kendell Bane RD, LDN, CNSC [phone] Pager [phone] After Hours Pager

## 2013-01-21 NOTE — Progress Notes (Signed)
PULMONARY  / CRITICAL CARE MEDICINE  Name: [name] MRN: [id number] DOB: 1950-12-27    ADMISSION DATE:  01/19/2013 CONSULTATION DATE:    REFERRING MD :   PRIMARY SERVICE: PCCM  CHIEF COMPLAINT:  resp failure  BRIEF PATIENT DESCRIPTION: 62 yo [place] resident with COPD, OSA, OHS (on BiPAP / oxygen), developed resp distress at SNF, failed CPAP. Intubated for diastolic CHF. Has leg edema. Diuresed & extubated  SIGNIFICANT EVENTS / STUDIES:  7/19>> Intubated 7/20 >>> extubated  LINES / TUBES: ETT 7/19>>>7/20 Foley 7/19 >>> OGT 7/19 >>>7/20  CULTURES: Urine 7/19 >>> no growth final  ANTIBIOTICS: None  SUBJECTIVE/Overnight:   Continued to diurese overnight. Had low grade fever yesterday PM. Reports breathing is doing well today.  VITAL SIGNS: Temp:  [98.9 F (37.2 C)-100.5 F (38.1 C)] 99.4 F (37.4 C) (07/21 0700) Pulse Rate:  [62-111] 72 (07/21 0700) Resp:  [18-35] 35 (07/21 0700) BP: (116-166)/(56-119) 166/90 mmHg (07/21 0700) SpO2:  [93 %-100 %] 97 % (07/21 0700) FiO2 (%):  [40 %] 40 % (07/21 0600) Weight:  [287 lb 0.6 oz (130.2 kg)] 287 lb 0.6 oz (130.2 kg) (07/21 0400) HEMODYNAMICS:   VENTILATOR SETTINGS: Vent Mode:  [-] CPAP;PSV FiO2 (%):  [40 %] 40 % Set Rate:  [20 bmp] 20 bmp Vt Set:  [450 mL] 450 mL PEEP:  [5 cmH20] 5 cmH20 Pressure Support:  [10 cmH20-12 cmH20] 12 cmH20 Plateau Pressure:  [20 cmH20] 20 cmH20 INTAKE / OUTPUT: Intake/Output     07/20 0701 - 07/21 0700 07/21 0701 - 07/22 0700   P.O. 360    I.V. (mL/kg) 252.5 (1.9)    NG/GT 200    Total Intake(mL/kg) 812.5 (6.2)    Urine (mL/kg/hr) 7375 (2.4)    Stool 1 (0)    Total Output 7376     Net -6563.5            PHYSICAL EXAMINATION: General:  NAD, asleep in bed, awakens to voice Neuro:  Follows commands, interactive HEENT:  Atraumatic,on [place] Cardiovascular:  RRR Lungs:  mild expiratory wheeze bilaterally L>R, otherwise clear Abdomen:  obese, soft, nontender to  palpation Musculoskeletal:  Bilateral leg edema, legs wrapped from knee down in pressure boots Skin:  No rashes noted  LABS:  Recent Labs Lab 01/19/13 1000 01/20/13 0710 01/21/13 0510  HGB 10.0* 10.1* 10.4*  HCT 32.8* 32.3* 34.4*  WBC 9.1 8.6 10.0  PLT 199 207 227    Recent Labs Lab 01/19/13 1000 01/19/13 1924 01/20/13 0710 01/20/13 1556 01/21/13 0510  NA 141 140 141 142 142  K 4.2 3.7 3.1* 3.5 3.3*  CL 88* 87* 89* 88* 90*  CO2 >45* >45* 44* 45* 45*  GLUCOSE 109* 124* 115* 122* 108*  BUN 23 29* 37* 35* 28*  CREATININE 0.86 0.92 1.04 0.91 0.85  CALCIUM 10.0 9.6 10.0 10.4 9.6  MG 1.7  --   --   --   --   PHOS 2.1*  --   --   --   --      Recent Labs Lab 01/20/13 1126 01/20/13 1503 01/20/13 1928 01/20/13 2346 01/21/13 0355  GLUCAP 145* 110* 135* 104* 104*    CXR: elevation in R hemidiaphragm c/w previous studies  ASSESSMENT / PLAN:  PULMONARY A: Acute resp failure, possibly aspiration. - began after getting asa but more likely diastolic CHF Known OSA/OHS on AutoPAP.  P:   -extubated 7/20 -incentive spirometry -O2 supplemental prn keep sats >88 -continue qhs bipap (home regimen) -  Bronchodilators.  -cont diuresis - has done well  CARDIOVASCULAR A: HTN, CAD. Fluid overload on admission with high BNP Likely diastolic CHF. P:  -cont diurese as bp, Scr tol  -BP control -continue diuresis with Lasix 40mg  PO BID   RENAL A:  No acute renal failure Hypokalemia P:   -cont diurese as tol, creatinine stable -replete K  GASTROINTESTINAL A:  Morbid obesity Did well with bedside swallow screen per RN P:   -carb modified diet -home PPI  HEMATOLOGIC A:  Chronic anemia - No indication transfusion for now, Hgb stable DVT prevention P:  Trend CBC Heparin SQ  INFECTIOUS A:  Mild fever yesterday, responded to tylenol Chronic lower extremity wounds P:   - Cont monitor off abx. - wound team recs made 7/20, lower legs wrapped  ENDOCRINE A:  DM P:    - SSI, sugars well controlled  NEUROLOGIC A:  No acute issues Hx bipolar P:   -monitor mental status -continue home psych meds (restart cymbalta today) -out of bed, PT consult   TODAY's SUMMARY: transfer to telemetry/triad, continue diuresis, give diet  Levert Feinstein, MD Family Medicine PGY-2   Care during the described time interval was provided by me and/or other providers on the critical care team.  I have reviewed this patient's available data, including medical history, events of note, physical examination and test results as part of my evaluation  [name],Alice V.

## 2013-01-21 NOTE — Progress Notes (Signed)
Utilization Review Completed Deontray Hunnicutt J. Kaylinn Dedic, RN, BSN, NCM [phone]  

## 2013-01-22 DIAGNOSIS — G4733 Obstructive sleep apnea (adult) (pediatric): Secondary | ICD-10-CM

## 2013-01-22 DIAGNOSIS — I10 Essential (primary) hypertension: Secondary | ICD-10-CM

## 2013-01-22 DIAGNOSIS — E119 Type 2 diabetes mellitus without complications: Secondary | ICD-10-CM

## 2013-01-22 LAB — BASIC METABOLIC PANEL
Calcium: 9.7 mg/dL (ref 8.4–10.5)
Creatinine, Ser: 0.82 mg/dL (ref 0.50–1.10)
GFR calc Af Amer: 86 mL/min — ABNORMAL LOW (ref >90)
GFR calc non Af Amer: 75 mL/min — ABNORMAL LOW (ref >90)
Sodium: 141 mEq/L (ref 135–145)

## 2013-01-22 LAB — LIPID PANEL
HDL: 73 mg/dL (ref >39)
Total CHOL/HDL Ratio: 3 RATIO
VLDL: 63 mg/dL — ABNORMAL HIGH (ref 0–40)

## 2013-01-22 LAB — CBC
MCV: 87.3 fL (ref 78.0–100.0)
Platelets: 232 10*3/uL (ref 150–400)
RDW: 15.5 % (ref 11.5–15.5)
WBC: 10.3 10*3/uL (ref 4.0–10.5)

## 2013-01-22 LAB — GLUCOSE, CAPILLARY: Glucose-Capillary: 113 mg/dL — ABNORMAL HIGH (ref 70–99)

## 2013-01-22 MED ORDER — METOLAZONE 5 MG PO TABS
5.0000 mg | ORAL_TABLET | Freq: Every day | ORAL | Status: DC
  Administered 2013-01-23: 5 mg via ORAL
  Filled 2013-01-22 (×2): qty 1

## 2013-01-22 NOTE — Progress Notes (Addendum)
[facility] PROGRESS NOTE  [name] UJW:[id number] DOB: Aug 11, 1950 DOA: 01/19/2013 PCP: Karlene Einstein, MD  Assessment/Plan: 1. OHS on BiPAP; 2. COPD; patient states wears 24-hour day oxygen currently at 2 L during the day and at night Will use CPAP (per patient settings are 3 mmHg) respiratory to titrate to appropriate setting to ensure SpO2 greater than 92% and 0 no apneic events  3. OSA; see #2 4. HTN; controlled on current medication 5. CHF; we'll continue to gently diuresis patient endorsed worries true patient is approximately 25 pounds greater than her stated dry weight (base weight= 250lbs). Current weight= 287lbs, maintain strict I/ O. daily a.m. weights. Continue Lasix 40 mg twice a day and Zaroxolyn daily. Patient had 3x negative troponin 6. HLD;obtain lipid panel 7. DM; obtain hemoglobin A1c   Code Status: Full Disposition Plan:    Consultants:    Procedures:    Antibiotics:  Cardiac echo 11/06/2012  Left ventricle: Moderate LVH. LVEF=  60% to 65%.  ; there were no regional wall motion abnormalities. - Mitral valve: Calcified annulus. Mildly thickened leaflets  PCXR 01/21/2013 1. Support apparatus have been removed.  2. Low lung volumes with bibasilar atelectasis and/or  consolidation (left greater than right).  3. Trace right pleural effusion.  4. Cardiomegaly with pulmonary venous congestion, but no frank  pulmonary edema.     HPI/Subjective: BRIEF PATIENT DESCRIPTION: 62 yo [place] resident with COPD, OSA, OHS (on BiPAP / oxygen), HTN, bipolar disorder, depression developed resp distress at SNF, failed CPAP. Intubated for diastolic CHF. Has leg edema. Diuresed & extubated. States does dry weight is approximately 250Lbs, does not know who her PCP is, does not know who her cardiologist, does not know her pulmonologist is, unsure of what medications she should be taking. Does not weigh herself on a daily basis   Objective: Filed Vitals:    01/22/13 0834 01/22/13 1040 01/22/13 1300 01/22/13 1455  BP:  98/83 103/69   Pulse:   98   Temp:   98.6 F (37 C)   TempSrc:   Oral   Resp:   22   Height:      Weight:      SpO2: 90%  96% 99%    Intake/Output Summary (Last 24 hours) at 01/22/13 1631 Last data filed at 01/22/13 1410  Gross per 24 hour  Intake    960 ml  Output   1202 ml  Net   -242 ml   Filed Weights   01/21/13 0400 01/21/13 1436 01/22/13 0530  Weight: 130.2 kg (287 lb 0.6 oz) 130.9 kg (288 lb 9.3 oz) 129.8 kg (286 lb 2.5 oz)    Exam:   General:  Alert,NAD  Cardiovascular: Regular rhythm and rate, negative murmurs rubs gallops, DP/DP pulse one plus bilateral  Respiratory: Unable to hear the lower lobes, most likely secondary to girth. Upper lobes clear to auscultation bilateral  Abdomen: Morbidly obese, nontender, nondistended plus bowel sounds  Musculoskeletal: Appears that patient has healing venous stasis sores on left lower extremity.   Data Reviewed: Basic Metabolic Panel:  Recent Labs Lab 01/19/13 1000 01/19/13 1924 01/20/13 0710 01/20/13 1556 01/21/13 0510 01/22/13 0725  NA 141 140 141 142 142 141  K 4.2 3.7 3.1* 3.5 3.3* 3.6  CL 88* 87* 89* 88* 90* 92*  CO2 >45* >45* 44* 45* 45* 41*  GLUCOSE 109* 124* 115* 122* 108* 122*  BUN 23 29* 37* 35* 28* 23  CREATININE 0.86 0.92 1.04 0.91 0.85 0.82  CALCIUM 10.0  9.6 10.0 10.4 9.6 9.7  MG 1.7  --   --   --   --   --   PHOS 2.1*  --   --   --   --   --    Liver Function Tests:  Recent Labs Lab 01/19/13 1000  AST 23  ALT 11  ALKPHOS 61  BILITOT 0.1*  PROT 7.0  ALBUMIN 3.2*   No results found for this basename: LIPASE, AMYLASE,  in the last 168 hours No results found for this basename: AMMONIA,  in the last 168 hours CBC:  Recent Labs Lab 01/19/13 0230 01/19/13 1000 01/20/13 0710 01/21/13 0510 01/22/13 0725  WBC 9.8 9.1 8.6 10.0 10.3  NEUTROABS 5.4 7.4  --   --   --   HGB 10.9* 10.0* 10.1* 10.4* 11.2*  HCT 36.0 32.8* 32.3*  34.4* 35.6*  MCV 90.9 87.5 85.7 88.0 87.3  PLT 217 199 207 227 232   Cardiac Enzymes:  Recent Labs Lab 01/19/13 0231 01/19/13 1000 01/19/13 1410 01/19/13 1630  TROPONINI <0.30 <0.30 <0.30 <0.30   BNP (last 3 results)  Recent Labs  11/05/12 1800 01/19/13 0231  PROBNP 332.1* 1633.0*   CBG:  Recent Labs Lab 01/21/13 1603 01/21/13 2110 01/22/13 0702 01/22/13 1125 01/22/13 1544  GLUCAP 125* 143* 109* 133* 102*    Recent Results (from the past 240 hour(s))  URINE CULTURE     Status: None   Collection Time    01/19/13  2:57 AM      Result Value Range Status   Specimen Description URINE, CATHETERIZED   Final   Special Requests NONE   Final   Culture  Setup Time 01/19/2013 03:20   Final   Colony Count NO GROWTH   Final   Culture NO GROWTH   Final   Report Status 01/20/2013 FINAL   Final  MRSA PCR SCREENING     Status: None   Collection Time    01/19/13  5:48 AM      Result Value Range Status   MRSA by PCR NEGATIVE  NEGATIVE Final   Comment:            The GeneXpert MRSA Assay (FDA     approved for NASAL specimens     only), is one component of a     comprehensive MRSA colonization     surveillance program. It is not     intended to diagnose MRSA     infection nor to guide or     monitor treatment for     MRSA infections.     Studies: Dg Chest Portable 1 View  01/21/2013   *RADIOLOGY REPORT*  Clinical Data: Respiratory failure.  PORTABLE CHEST - 1 VIEW  Comparison: Chest x-ray 01/20/2013.  Findings: The patient has been extubated and previously noted nasogastric tube has been removed.  Lung volumes remain the level, and there are bibasilar opacities which may reflect areas of atelectasis and/or consolidation. Trace right pleural effusion (unchanged).  Cephalization and crowding of the pulmonary vasculature, without frank pulmonary edema.  Heart size is mildly enlarged. The patient is rotated to the right on today's exam, resulting in distortion of the mediastinal  contours and reduced diagnostic sensitivity and specificity for mediastinal pathology. Surgical clips project over the right upper quadrant of the abdomen, likely from prior cholecystectomy.  IMPRESSION: 1.  Support apparatus have been removed. 2.  Low lung volumes with bibasilar atelectasis and/or consolidation (left greater than right). 3.  Trace right  pleural effusion. 4.  Cardiomegaly with pulmonary venous congestion, but no frank pulmonary edema.   Original Report Authenticated By: Trudie Reed, M.D.    Scheduled Meds: . albuterol  2.5 mg Nebulization Q6H  . amLODipine  5 mg Oral Daily  . ARIPiprazole  10 mg Oral Daily  . busPIRone  10 mg Oral BID  . divalproex  500 mg Oral BID  . DULoxetine  60 mg Oral BID  . feeding supplement  237 mL Oral Q2000  . furosemide  40 mg Oral BID  . heparin  5,000 Units Subcutaneous Q8H  . insulin aspart  0-15 Units Subcutaneous TID WC  . ipratropium  0.5 mg Nebulization Q6H  . isosorbide mononitrate  15 mg Oral Daily  . lisinopril  5 mg Oral Daily  . multivitamin  5 mL Per Tube Daily  . pantoprazole  40 mg Oral Daily   Continuous Infusions:   Active Problems:   Acute diastolic CHF (congestive heart failure)    Time spent: 50 minutes   Alean Kromer, J  [facility] Pager [phone]. If 7PM-7AM, please contact night-coverage at [email or web address], password [facility] 01/22/2013, 4:31 PM  LOS: 3 days

## 2013-01-22 NOTE — Progress Notes (Signed)
Pt complaining of itchiness and burning sensation on the Foley catheter site, K.Scorr was notified and ordered to discontinue FC. Pulled out foley catheter and peri care was done. Pt stated " it feels a lot better". Will continue to monitor pt

## 2013-01-22 NOTE — Clinical Documentation Improvement (Signed)
THIS DOCUMENT IS NOT A PERMANENT PART OF THE MEDICAL RECORD  Please update your documentation with the medical record to reflect your response to this query. If you need help knowing how to do this please call [phone].  01/22/13  Dr. Joseph Art,  In a better effort to capture your patient's severity of illness, reflect appropriate length of stay and utilization of resources, a review of the patient medical record has revealed the following indicators:   - "Diastolic Heart Failure" documented in Progress Note 01/21/13.    Based on your clinical judgment, please document the ACUITY of the Diastolic Heart Failure monitored and treated this admission in the progress notes and discharge summary:  ACUITY:  - Acute  - Acute on Chronic  - Chronic  In responding to this query please exercise your independent judgment.    The fact that a query is asked, does not imply that any particular answer is desired or expected.    Reviewed: 01/23/13 - acute diastolic heart failure documented by dr. Joseph Art pn 01/22/13.  Mathis Dad RN  Thank You,  Jerral Ralph  RN BSN CCDS Certified Clinical Documentation Specialist [phone] Health Information Management [place]

## 2013-01-22 NOTE — Progress Notes (Signed)
[name].  No acute distress noted.  Dr. Joseph Art informed.  MD to f/ with pt.  Will continue to monitor.  Amanda Pea, Charity fundraiser.

## 2013-01-22 NOTE — Evaluation (Signed)
Physical Therapy Evaluation Patient Details Name: [name] MRN: [id number] DOB: October 15, 1950 Today's Date: 01/22/2013 Time: 1320-1350 PT Time Calculation (min): 30 min  PT Assessment / Plan / Recommendation History of Present Illness  Pt resides in NH. Pt had episode of resp failure upon swallowing an asprin at [facility].  Clinical Impression  Suspect pt to be near baseline level of function. Pt motivated however has limited standing tolerance and increased difficulty with advancing LEs to complete ambulation or std pvt transfers. Acute PT to follow to prevent further deconditioning and improve activity tolerance.    PT Assessment  Patient needs continued PT services    Follow Up Recommendations  SNF    Does the patient have the potential to tolerate intense rehabilitation      Barriers to Discharge        Equipment Recommendations       Recommendations for Other Services     Frequency Min 2X/week    Precautions / Restrictions Precautions Precautions: Fall Required Braces or Orthoses:  (L una boot) Restrictions Weight Bearing Restrictions: No   Pertinent Vitals/Pain Pt denies pain      Mobility  Bed Mobility Bed Mobility: Supine to Sit Supine to Sit: 1: +2 Total assist;HOB elevated Supine to Sit: Patient Percentage: 60% Details for Bed Mobility Assistance: pt able to bring LEs off EOB, maxA for trunk elevation and to bring hips to EOB Transfers Transfers: Sit to Stand;Stand to Sit;Stand Pivot Transfers Sit to Stand: 1: +2 Total assist;With upper extremity assist;From bed Sit to Stand: Patient Percentage: 50% Stand to Sit: 1: +2 Total assist;To chair/3-in-1;To bed Stand to Sit: Patient Percentage: 40% Stand Pivot Transfers: 1: +2 Total assist Stand Pivot Transfers: Patient Percentage: 40% Details for Transfer Assistance: utilized gait belt with bed pad to complete transfer. pt with limited standing endurance and abiltiy to advance LEs. Pt c/o of dizziness and  SOB Ambulation/Gait Ambulation/Gait Assistance: Not tested (comment)    Exercises General Exercises - Lower Extremity Ankle Circles/Pumps: AROM;Both;10 reps;Seated Long Arc Quad: AROM;Both;10 reps;Seated   PT Diagnosis: Difficulty walking;Generalized weakness  PT Problem List: Decreased strength;Decreased activity tolerance;Decreased mobility PT Treatment Interventions: DME instruction;Gait training;Functional mobility training;Therapeutic exercise;Therapeutic activities     PT Goals(Current goals can be found in the care plan section) Acute Rehab PT Goals PT Goal Formulation: With patient Time For Goal Achievement: 02/05/13 Potential to Achieve Goals: Fair  Visit Information  Last PT Received On: 01/22/13 Assistance Needed: +2 History of Present Illness: Pt resides in NH. Pt had episode of resp failure upon swallowing an asprin at [facility].       Prior Functioning  Home Living Family/patient expects to be discharged to:: Skilled nursing facility Prior Function Level of Independence: Needs assistance Gait / Transfers Assistance Needed: pt w/c / bed bound ADL's / Homemaking Assistance Needed: assist for all ADLs Communication Communication: No difficulties Dominant Hand: Right    Cognition  Cognition Arousal/Alertness: Awake/alert Behavior During Therapy: WFL for tasks assessed/performed Overall Cognitive Status: Within Functional Limits for tasks assessed    Extremity/Trunk Assessment Upper Extremity Assessment Upper Extremity Assessment: Generalized weakness Lower Extremity Assessment Lower Extremity Assessment: Generalized weakness Cervical / Trunk Assessment Cervical / Trunk Assessment: Normal   Balance Balance Balance Assessed: Yes Static Sitting Balance Static Sitting - Balance Support: Bilateral upper extremity supported Static Sitting - Level of Assistance: 5: Stand by assistance Static Sitting - Comment/# of Minutes: 10 min  End of Session PT - End of  Session Equipment Utilized During Treatment:  Gait belt Activity Tolerance: Patient limited by fatigue Patient left: in chair;with call bell/phone within reach Nurse Communication: Mobility status;Need for lift equipment (bed pad left in chair)  GP     Charla Criscione Marie 01/22/2013, 4:22 PM  Lewis Shock, PT, DPT Pager #: [phone] Office #: [phone]

## 2013-01-22 NOTE — Clinical Social Work Psychosocial (Signed)
     Clinical Social Work Department BRIEF PSYCHOSOCIAL ASSESSMENT 01/22/2013  Patient:  [name], [name]     Account Number:  [id number]     Admit date:  01/19/2013  Clinical Social Worker:  Tiburcio Pea  Date/Time:  01/22/2013 02:08 PM  Referred by:  Physician  Date Referred:  01/20/2013 Referred for  SNF Placement   Other Referral:   (Return to facility)   Interview type:  Patient Other interview type:    PSYCHOSOCIAL DATA Living Status:  FACILITY Admitted from facility:  [facility] Level of care:  Skilled Nursing Facility Primary support name:  Arelia Longest    [phone] Primary support relationship to patient:  CHILD, ADULT Degree of support available:   Strong support    CURRENT CONCERNS Current Concerns  Post-Acute Placement   Other Concerns:   (Return)    SOCIAL WORK ASSESSMENT / PLAN 62 year old female - resident of [facility].  CSW met with patient- she plans to return to facililty when medically stable.  She is unable to hold her bed and was worried that she would not be able to return. CSW provided reassurance and discussed bed search process should there not be a bed available at d/c.  CSW contacted Marylene Land, Insurance claims handler at [facility]. She plans to accept patient back when medically stable. Patient gave permission for CSW to contact her daughter Aurea Graff as needed regarding dc.   Assessment/plan status:  Psychosocial Support/Ongoing Assessment of Needs Other assessment/ plan:   Information/referral to community resources:   None at this time    PATIENTS/FAMILYS RESPONSE TO PLAN OF CARE: Patient is alert, oriented and extremely pleasant. She states that she likes her care and room at [facility] and wants to return there when stable. CSW left message for patient's daughter Aurea Graff to offer support and asisstance with d/c .

## 2013-01-23 LAB — VALPROIC ACID LEVEL: Valproic Acid Lvl: 43.4 ug/mL — ABNORMAL LOW (ref 50.0–100.0)

## 2013-01-23 MED ORDER — DIVALPROEX SODIUM ER 250 MG PO TB24: 250.0000 mg | ORAL_TABLET | Freq: Two times a day (BID) | ORAL | Status: DC

## 2013-01-23 MED ORDER — LISINOPRIL 5 MG PO TABS: 5.0000 mg | ORAL_TABLET | Freq: Every day | ORAL | Status: DC

## 2013-01-23 MED ORDER — METOLAZONE 5 MG PO TABS: 5.0000 mg | ORAL_TABLET | ORAL | Status: DC

## 2013-01-23 MED ORDER — ALBUTEROL SULFATE (5 MG/ML) 0.5% IN NEBU: 2.5000 mg | INHALATION_SOLUTION | RESPIRATORY_TRACT | Status: DC | PRN

## 2013-01-23 MED ORDER — FUROSEMIDE 40 MG PO TABS: 40.0000 mg | ORAL_TABLET | Freq: Two times a day (BID) | ORAL | Status: DC

## 2013-01-23 MED ORDER — OXYCODONE-ACETAMINOPHEN 5-325 MG PO TABS: 1.0000 | ORAL_TABLET | Freq: Three times a day (TID) | ORAL | Status: DC | PRN

## 2013-01-23 NOTE — Progress Notes (Signed)
Report called to [facility], pt transported by EMS to facility, paperwork given to family by Lupita Leash, SW. All questions answered. Pt Alert and oriented times 4 upon discharge.

## 2013-01-23 NOTE — Plan of Care (Signed)
Problem: Discharge Progression Outcomes Goal: Able to self administer respiratory meds Outcome: Not Met (add Reason) Pt is total care Goal: Independent ADLs or Home Health Care Outcome: Not Met (add Reason) Pt is total care and being discharged to [facility] facility

## 2013-01-23 NOTE — Discharge Summary (Signed)
Physician Discharge Summary  [name] MRN: [id number] DOB/AGE: 1950-09-14 62 y.o.  PCP: Karlene Einstein, MD   Admit date: 01/19/2013 Discharge date: 01/23/2013  Discharge Diagnoses:       OBSTRUCTIVE SLEEP APNEA   HYPERTENSION   C O P D   Chronic respiratory failure   Acute diastolic CHF (congestive heart failure)     Medication List         albuterol (2.5 MG/3ML) 0.083% nebulizer solution  Commonly known as:  PROVENTIL  Take 2.5 mg by nebulization every 6 (six) hours as needed for wheezing.     amLODipine 5 MG tablet  Commonly known as:  NORVASC  Take 5 mg by mouth daily.     ARIPiprazole 10 MG tablet  Commonly known as:  ABILIFY  Take 10 mg by mouth daily. For depressive psychosis     ascorbic acid 500 MG tablet  Commonly known as:  VITAMIN C  Take 500 mg by mouth 2 (two) times daily.     aspirin 325 MG tablet  Take 325 mg by mouth daily.     budesonide-formoterol 160-4.5 MCG/ACT inhaler  Commonly known as:  SYMBICORT  Inhale 2 puffs into the lungs 2 (two) times daily.     busPIRone 10 MG tablet  Commonly known as:  BUSPAR  Take 10 mg by mouth 2 (two) times daily.     calcium-vitamin D 500-200 MG-UNIT per tablet  Commonly known as:  OSCAL WITH D  Take 1 tablet by mouth 2 (two) times daily.     CERTAGEN PO  Take 1 tablet by mouth daily.     cloNIDine 0.2 MG tablet  Commonly known as:  CATAPRES  Take 0.2 mg by mouth 2 (two) times daily.     divalproex 250 MG 24 hr tablet  Commonly known as:  DEPAKOTE ER  Take 1 tablet (250 mg total) by mouth 2 (two) times daily.     docusate sodium 100 MG capsule  Commonly known as:  COLACE  Take 100 mg by mouth daily.     DULoxetine 60 MG capsule  Commonly known as:  CYMBALTA  Take 60 mg by mouth 2 (two) times daily.     feeding supplement Liqd  Take 30 mLs by mouth 2 (two) times daily.     fluticasone 50 MCG/ACT nasal spray  Commonly known as:  FLONASE  Place 2 sprays into the nose  daily.     folic acid 1 MG tablet  Commonly known as:  FOLVITE  Take 1 mg by mouth daily.     furosemide 40 MG tablet  Commonly known as:  LASIX  Take 1 tablet (40 mg total) by mouth 2 (two) times daily.     gabapentin 400 MG capsule  Commonly known as:  NEURONTIN  Take 400 mg by mouth 3 (three) times daily.     isosorbide mononitrate 30 MG 24 hr tablet  Commonly known as:  IMDUR  Take 30 mg by mouth daily.     lisinopril 5 MG tablet  Commonly known as:  PRINIVIL,ZESTRIL  Take 1 tablet (5 mg total) by mouth daily.     metoCLOPramide 5 MG tablet  Commonly known as:  REGLAN  Take 5 mg by mouth 3 (three) times daily.     metolazone 5 MG tablet  Commonly known as:  ZAROXOLYN  Take 1 tablet (5 mg total) by mouth 3 days.     nitroGLYCERIN 0.4 MG SL tablet  Commonly known  as:  NITROSTAT  Place 0.4 mg under the tongue every 5 (five) minutes as needed for chest pain.     oxyCODONE-acetaminophen 5-325 MG per tablet  Commonly known as:  PERCOCET/ROXICET  Take 1 tablet by mouth every 8 (eight) hours as needed. For pain     pantoprazole 40 MG tablet  Commonly known as:  PROTONIX  Take 40 mg by mouth 2 (two) times daily.     PATADAY 0.2 % Soln  Generic drug:  Olopatadine HCl  Place 1 drop into both eyes daily.     senna 8.6 MG Tabs  Commonly known as:  SENOKOT  Take 2 tablets by mouth daily.     simethicone 80 MG chewable tablet  Commonly known as:  MYLICON  Chew 80 mg by mouth every 8 (eight) hours.     SYSTANE BALANCE OP  Place 1 drop into both eyes 2 (two) times daily.     tiotropium 18 MCG inhalation capsule  Commonly known as:  SPIRIVA  Place 18 mcg into inhaler and inhale daily.     vitamin D (CHOLECALCIFEROL) 400 UNITS tablet  Take 400 Units by mouth daily.        Discharge Condition: Stable   Disposition: 03-Skilled Nursing Facility   Consults: Critical care    Significant Diagnostic Studies: Dg Chest 2 View  01/13/2013   *RADIOLOGY REPORT*   Clinical Data: Shortness of breath and fever.  CHEST - 2 VIEW  Comparison: 11/09/2012  Findings: Lung volumes are extremely low bilaterally.  The heart is mildly enlarged.  No overt pulmonary consolidation or edema is identified.  No significant pleural fluid is seen.  IMPRESSION: Very low lung volumes and mild cardiomegaly.   Original Report Authenticated By: Irish Lack, M.D.   Dg Chest Portable 1 View  01/21/2013   *RADIOLOGY REPORT*  Clinical Data: Respiratory failure.  PORTABLE CHEST - 1 VIEW  Comparison: Chest x-ray 01/20/2013.  Findings: The patient has been extubated and previously noted nasogastric tube has been removed.  Lung volumes remain the level, and there are bibasilar opacities which may reflect areas of atelectasis and/or consolidation. Trace right pleural effusion (unchanged).  Cephalization and crowding of the pulmonary vasculature, without frank pulmonary edema.  Heart size is mildly enlarged. The patient is rotated to the right on today's exam, resulting in distortion of the mediastinal contours and reduced diagnostic sensitivity and specificity for mediastinal pathology. Surgical clips project over the right upper quadrant of the abdomen, likely from prior cholecystectomy.  IMPRESSION: 1.  Support apparatus have been removed. 2.  Low lung volumes with bibasilar atelectasis and/or consolidation (left greater than right). 3.  Trace right pleural effusion. 4.  Cardiomegaly with pulmonary venous congestion, but no frank pulmonary edema.   Original Report Authenticated By: Trudie Reed, M.D.   Dg Chest Portable 1 View  01/20/2013   *RADIOLOGY REPORT*  Clinical Data: Respiratory failure  PORTABLE CHEST - 1 VIEW  Comparison: 01/19/2013  Findings:  ET tube tip is above the carina.  The nasogastric tube tip is within the stomach.  The heart size is mildly enlarged.  Lung volumes are low and there is asymmetric elevation of the right hemidiaphragm.  Slight interval improvement in pulmonary  edema.  IMPRESSION:  1.  Stable support apparatus. 2.  Slight improvement in interstitial edema pattern.   Original Report Authenticated By: Signa Kell, M.D.   Dg Chest Portable 1 View  01/19/2013   *RADIOLOGY REPORT*  Clinical Data: Endotracheal tube and orogastric tube placement.  PORTABLE CHEST - 1 VIEW  Comparison: Chest radiograph performed 01/13/2013  Findings: The patient's endotracheal tube is seen ending 2 cm above the carina.  An orogastric tube is seen extending below the diaphragm.  There is elevation of the right hemidiaphragm.  The lungs remain somewhat hypoexpanded.  Vascular congestion is again noted, with diffusely increased interstitial markings, likely reflecting pulmonary edema.  No definite pleural effusion or pneumothorax is seen.  The cardiomediastinal silhouette is enlarged.  No acute osseous abnormalities are seen.  IMPRESSION:  1.  Endotracheal tube seen ending 2 cm above the carina. 2.  Orogastric tube noted extending below the diaphragm. 3.  Lungs hypoexpanded; vascular congestion and cardiomegaly again noted, with increased interstitial markings, likely reflecting mild pulmonary edema.   Original Report Authenticated By: Tonia Ghent, M.D.   Dg Knee Complete 4 Views Right  01/13/2013   *RADIOLOGY REPORT*  Clinical Data: Knee pain, no injury  RIGHT KNEE - COMPLETE 4+ VIEW  Comparison: None  Findings: Advanced degenerative changes in the knee joint with marked cartilage loss and extensive osteophyte formation in all three compartments.  There is lateral subluxation of the tibia.  Negative for fracture.  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  Negative for fracture.   Original Report Authenticated By: Janeece Riggers, M.D.     2-D echo during previous admission LV EF: 60% - 65%  ------------------------------------------------------------ Indications: CHF - 428.0.  ------------------------------------------------------------ History: Risk  factors: Morbidly obese.  ------------------------------------------------------------ Study Conclusions  - Left ventricle: The cavity size was normal. Wall thickness was increased in a pattern of moderate LVH. Systolic function was normal. The estimated ejection fraction was in the range of 60% to 65%. Wall motion was normal; there were no regional wall motion abnormalities. - Mitral valve: Calcified annulus. Mildly thickened leafle   Microbiology: Recent Results (from the past 240 hour(s))  URINE CULTURE     Status: None   Collection Time    01/19/13  2:57 AM      Result Value Range Status   Specimen Description URINE, CATHETERIZED   Final   Special Requests NONE   Final   Culture  Setup Time 01/19/2013 03:20   Final   Colony Count NO GROWTH   Final   Culture NO GROWTH   Final   Report Status 01/20/2013 FINAL   Final  MRSA PCR SCREENING     Status: None   Collection Time    01/19/13  5:48 AM      Result Value Range Status   MRSA by PCR NEGATIVE  NEGATIVE Final   Comment:            The GeneXpert MRSA Assay (FDA     approved for NASAL specimens     only), is one component of a     comprehensive MRSA colonization     surveillance program. It is not     intended to diagnose MRSA     infection nor to guide or     monitor treatment for     MRSA infections.     Labs: Results for orders placed during the hospital encounter of 01/19/13 (from the past 48 hour(s))  GLUCOSE, CAPILLARY     Status: Abnormal   Collection Time    01/21/13  1:35 PM      Result Value Range   Glucose-Capillary 121 (*) 70 - 99 mg/dL  GLUCOSE, CAPILLARY     Status: Abnormal   Collection Time    01/21/13  4:03  PM      Result Value Range   Glucose-Capillary 125 (*) 70 - 99 mg/dL   Comment 1 Notify RN    GLUCOSE, CAPILLARY     Status: Abnormal   Collection Time    01/21/13  9:10 PM      Result Value Range   Glucose-Capillary 143 (*) 70 - 99 mg/dL   Comment 1 Documented in Chart     Comment 2  Notify RN    GLUCOSE, CAPILLARY     Status: Abnormal   Collection Time    01/22/13  7:02 AM      Result Value Range   Glucose-Capillary 109 (*) 70 - 99 mg/dL  BASIC METABOLIC PANEL     Status: Abnormal   Collection Time    01/22/13  7:25 AM      Result Value Range   Sodium 141  135 - 145 mEq/L   Potassium 3.6  3.5 - 5.1 mEq/L   Chloride 92 (*) 96 - 112 mEq/L   CO2 41 (*) 19 - 32 mEq/L   Comment: CRITICAL RESULT CALLED TO, READ BACK BY AND VERIFIED WITH:     A.MAGBITANG,RN 0911 01/22/13 CLARK,S   Glucose, Bld 122 (*) 70 - 99 mg/dL   BUN 23  6 - 23 mg/dL   Creatinine, Ser 9.60  0.50 - 1.10 mg/dL   Calcium 9.7  8.4 - 45.4 mg/dL   GFR calc non Af Amer 75 (*) >90 mL/min   GFR calc Af Amer 86 (*) >90 mL/min   Comment:            The eGFR has been calculated     using the CKD EPI equation.     This calculation has not been     validated in all clinical     situations.     eGFR's persistently     <90 mL/min signify     possible Chronic Kidney Disease.  CBC     Status: Abnormal   Collection Time    01/22/13  7:25 AM      Result Value Range   WBC 10.3  4.0 - 10.5 K/uL   RBC 4.08  3.87 - 5.11 MIL/uL   Hemoglobin 11.2 (*) 12.0 - 15.0 g/dL   HCT 09.8 (*) 11.9 - 14.7 %   MCV 87.3  78.0 - 100.0 fL   MCH 27.5  26.0 - 34.0 pg   MCHC 31.5  30.0 - 36.0 g/dL   RDW 82.9  56.2 - 13.0 %   Platelets 232  150 - 400 K/uL  GLUCOSE, CAPILLARY     Status: Abnormal   Collection Time    01/22/13 11:25 AM      Result Value Range   Glucose-Capillary 133 (*) 70 - 99 mg/dL  GLUCOSE, CAPILLARY     Status: Abnormal   Collection Time    01/22/13  3:44 PM      Result Value Range   Glucose-Capillary 102 (*) 70 - 99 mg/dL   Comment 1 Notify RN    LIPID PANEL     Status: Abnormal   Collection Time    01/22/13  7:31 PM      Result Value Range   Cholesterol 222 (*) 0 - 200 mg/dL   Triglycerides 865 (*) <150 mg/dL   HDL 73  >78 mg/dL   Total CHOL/HDL Ratio 3.0     VLDL 63 (*) 0 - 40 mg/dL   LDL  Cholesterol 86  0 -  99 mg/dL   Comment:            Total Cholesterol/HDL:CHD Risk     Coronary Heart Disease Risk Table                         Men   Women      1/2 Average Risk   3.4   3.3      Average Risk       5.0   4.4      2 X Average Risk   9.6   7.1      3 X Average Risk  23.4   11.0                Use the calculated Patient Ratio     above and the CHD Risk Table     to determine the patient's CHD Risk.                ATP III CLASSIFICATION (LDL):      <100     mg/dL   Optimal      191-478  mg/dL   Near or Above                        Optimal      130-159  mg/dL   Borderline      295-621  mg/dL   High      >308     mg/dL   Very High  GLUCOSE, CAPILLARY     Status: Abnormal   Collection Time    01/22/13  8:05 PM      Result Value Range   Glucose-Capillary 121 (*) 70 - 99 mg/dL   Comment 1 Notify RN     Comment 2 Documented in Chart    GLUCOSE, CAPILLARY     Status: Abnormal   Collection Time    01/22/13  9:38 PM      Result Value Range   Glucose-Capillary 113 (*) 70 - 99 mg/dL  GLUCOSE, CAPILLARY     Status: Abnormal   Collection Time    01/23/13  6:56 AM      Result Value Range   Glucose-Capillary 114 (*) 70 - 99 mg/dL  GLUCOSE, CAPILLARY     Status: Abnormal   Collection Time    01/23/13 11:11 AM      Result Value Range   Glucose-Capillary 127 (*) 70 - 99 mg/dL   Comment 1 Notify RN       HPI : 62 year old nursing home resident with COPD, obstructive sleep apnea, on BiPAP, developed respiratory distress nursing facility failed CPAP and was intubated for diastolic heart failure. She was diuresed and extubated on 7/20.  HOSPITAL COURSE:  Acute respiratory failure with possible aspiration. The patient has a known history of obstructive sleep apnea The patient was intubated and extubated on 7/20. The patient has been continued on supplemental oxygen with oxygen saturation remaining above 88%. The patient will continue with nebulizer treatments and continue with  aggressive diuresis   Acute on chronic diastolic heart failure repeat 2-D echo was not done because one was done recently The patient's Lasix has been increased to 40 mg by mouth twice a day she has also been initiated on Zaroxolyn every 72 hours She needs close monitoring of her BMP  on a weekly basis   Hypokalemia this has required repletion continue to monitor   Diabetes mellitus controlled  History  of bipolar disorder on multiple psychotropic medications Given her increased somnolence the Depakote level has been reduced and the patient's Depakote and ammonia level are still pending at this time    Discharge Exam:   Blood pressure 132/67, pulse 93, temperature 97.3 F (36.3 C), temperature source Oral, resp. rate 20, height 5\' 3"  (1.6 m), weight 128.595 kg (283 lb 8 oz), SpO2 95.00%.  General: NAD, asleep in bed, awakens to voice  Neuro: Follows commands, interactive  HEENT: Atraumatic,on [place]  Cardiovascular: RRR  Lungs: mild expiratory wheeze bilaterally L>R, otherwise clear  Abdomen: obese, soft, nontender to palpation  Musculoskeletal: Bilateral leg edema, legs wrapped from knee down in pressure boots  Skin: No rashes noted         Future Appointments Provider Department Dept Phone   02/25/2013 9:30 AM [facility] [phone]   Patient should wear two piece clothing and wear no powder or deodorant. Patient should arrive 15 minutes early.        SignedRicharda Overlie 01/23/2013, 11:59 AM

## 2013-01-23 NOTE — Progress Notes (Signed)
DC today back to [facility] via EMS. Ok per patient, daughter and notified patient's nurse- Juanita to give report. Patient and daughter are pleased with d/c arrangements. No further CSW needs identified. CSW signing off. Lorri Frederick. [place]  [phone]

## 2013-01-24 ENCOUNTER — Non-Acute Institutional Stay (SKILLED_NURSING_FACILITY): Payer: Medicaid Other | Admitting: Internal Medicine

## 2013-01-24 DIAGNOSIS — I10 Essential (primary) hypertension: Secondary | ICD-10-CM

## 2013-01-24 DIAGNOSIS — I509 Heart failure, unspecified: Secondary | ICD-10-CM

## 2013-01-24 DIAGNOSIS — J449 Chronic obstructive pulmonary disease, unspecified: Secondary | ICD-10-CM

## 2013-01-24 DIAGNOSIS — G4733 Obstructive sleep apnea (adult) (pediatric): Secondary | ICD-10-CM

## 2013-01-24 DIAGNOSIS — I5031 Acute diastolic (congestive) heart failure: Secondary | ICD-10-CM

## 2013-02-04 ENCOUNTER — Non-Acute Institutional Stay (SKILLED_NURSING_FACILITY): Payer: Medicaid Other | Admitting: Internal Medicine

## 2013-02-04 DIAGNOSIS — N390 Urinary tract infection, site not specified: Secondary | ICD-10-CM

## 2013-02-11 ENCOUNTER — Non-Acute Institutional Stay (SKILLED_NURSING_FACILITY): Payer: Medicaid Other | Admitting: Adult Health

## 2013-02-11 DIAGNOSIS — G250 Essential tremor: Secondary | ICD-10-CM

## 2013-02-13 ENCOUNTER — Non-Acute Institutional Stay (SKILLED_NURSING_FACILITY): Payer: Medicaid Other | Admitting: Internal Medicine

## 2013-02-25 ENCOUNTER — Ambulatory Visit: Payer: Medicaid Other

## 2013-02-25 DIAGNOSIS — N390 Urinary tract infection, site not specified: Secondary | ICD-10-CM | POA: Insufficient documentation

## 2013-02-25 NOTE — Progress Notes (Signed)
Patient ID: [name], female   DOB: 12-25-1950, 62 y.o.   MRN: [id number]        HISTORY & PHYSICAL  DATE: 01/24/2013   FACILITY: [facility]  LEVEL OF CARE: SNF (31)  ALLERGIES:  Allergies  Allergen Reactions  . Bee Venom Other (See Comments)    Unknown  . Nyquil Cold & [Dm-Doxylamine-Acetaminophen] Other (See Comments)    Unknown   . Penicillins Other (See Comments)    unknown  . Ppd [Tuberculin Purified Protein Derivative] Other (See Comments)    unknown    CHIEF COMPLAINT:  Manage CHF, COPD, and hypertension.    HISTORY OF PRESENT ILLNESS:  The patient is a 62 year-old, African-American female who was hospitalized due to acute respiratory distress.  After hospitalization, she is readmitted back to this facility for long-term care management.  She has the following problems:    CHF:The patient does not relate significant weight changes, denies sob, DOE, orthopnea, PNDs, palpitations or chest pain.  She has  chronic lower extremity swelling.  CHF remains stable.  No complications form the medications being used.  2D-echo showed EF of 60-65%, normal wall motion.  COPD: the COPD remains stable.  Pt denies sob, cough, wheezing or declining exercise tolerance.  No complications from the medications presently being used.   HTN: Pt 's HTN remains stable.  Denies CP, sob, DOE, pedal edema, headaches, dizziness or visual disturbances.  No complications from the medications currently being used.  Last BP :  146/72.     PAST MEDICAL HISTORY :  Past Medical History  Diagnosis Date  . COPD   . Hypertension   . Chronic respiratory failure     3 L oxygen  . GERD (gastroesophageal reflux disease)   . Diabetes mellitus   . Obesity, Class III, BMI 40-49.9 (morbid obesity)   . Obstructive sleep apnea on CPAP   . Peripheral vascular disease   . Bipolar disease, chronic   . Lymphedema   . COPD (chronic obstructive pulmonary disease)   . Cellulitis   . Coronary  artery disease   . Asthma   . Myocardial infarction   . Vertigo     PAST SURGICAL HISTORY: Past Surgical History  Procedure Laterality Date  . Abdominal hysterectomy    . Cholecystectomy    . Appendectomy    . Tubal ligation      SOCIAL HISTORY:  reports that she has never smoked. She has never used smokeless tobacco. She reports that she does not drink alcohol or use illicit drugs.  FAMILY HISTORY:  Family History  Problem Relation Age of Onset  . Prostate cancer      CURRENT MEDICATIONS: Reviewed per [facility]  REVIEW OF SYSTEMS:  See HPI otherwise 14 point ROS is negative.  PHYSICAL EXAMINATION  VS:  T 98.5       P 78      RR 20      BP 146/72      POX%        WT (Lb) 305  GENERAL: no acute distress, morbidly obese body habitus EYES: conjunctivae normal, sclerae normal, normal eye lids MOUTH/THROAT: lips without lesions,no lesions in the mouth,tongue is without lesions,uvula elevates in midline NECK: supple, trachea midline, no neck masses, no thyroid tenderness, no thyromegaly LYMPHATICS: no LAN in the neck, no supraclavicular LAN RESPIRATORY: breathing is even & unlabored, BS CTAB CARDIAC: RRR, no murmur,no extra heart sounds EDEMA/VARICOSITIES: bilateral lower extremities are edematous and  wrapped GI:  ABDOMEN: abdomen soft, normal BS, no masses, no tenderness  LIVER/SPLEEN: no hepatomegaly, no splenomegaly MUSCULOSKELETAL: HEAD: normal to inspection & palpation BACK: no kyphosis, scoliosis or spinal processes tenderness EXTREMITIES: LEFT UPPER EXTREMITY: full range of motion, normal strength & tone RIGHT UPPER EXTREMITY:  full range of motion, normal strength & tone LEFT LOWER EXTREMITY: strength intact, range of motion minimal  RIGHT LOWER EXTREMITY: strength intact, range of motion minimal  PSYCHIATRIC: the patient is alert & oriented to person, affect & behavior appropriate  LABS/RADIOLOGY: Chest x-ray:  No acute disease.    Right knee x-ray showed severe  tricompartmental degenerative disease, no acute fracture.    Urine culture showed no growth.    MRSA by PCR negative.     Glucose 122, otherwise BMP normal.    Hemoglobin 11.2, MCV 87.3, otherwise CBC normal.    Total cholesterol 222, triglycerides 314, HDL 73, LDL 86.    11/2012:  Hemoglobin A1c 6.1.    Fasting lipid panel normal.    ASSESSMENT/PLAN:  CHF.  Well compensated.    COPD.  Well compensated.    Hypertension.  Blood pressure borderline.  We will monitor.    Obstructive sleep apnea.  Continue CPAP.    Bipolar disorder.  Stable.    Peripheral neuropathy.  Continue Neurontin.    GERD.  Well controlled.     Constipation.  Well controlled.    Check CBC and BMP, and Depakote level.    I have reviewed patient's medical records received at admission/from hospitalization.  CPT CODE: 16109

## 2013-02-25 NOTE — Progress Notes (Signed)
Patient ID: [name], female   DOB: 09/15/1950, 62 y.o.   MRN: [id number]        PROGRESS NOTE  DATE: 02/04/2013  FACILITY:  [facility]  LEVEL OF CARE: SNF (31)  Acute Visit  CHIEF COMPLAINT:  Manage UTI and hypercalcemia.    HISTORY OF PRESENT ILLNESS: I was requested by the staff to assess the patient regarding above problem(s):  UTI:  On 01/28/2013, patient's urinalysis showed cloudy appearance, WBCs 11-30, RBCs 0-2.  Urine culture grew hemolytic streptococcus group B strep significantly.  Patient denies suprapubic pain or flank pain.    HYPERCALCEMIA:  New problem.  On 01/28/2013, patient's calcium level was 10.4.  In 11/2012, calcium level was 9.7.  Patient denies any neuromuscular problems.    PAST MEDICAL HISTORY : Reviewed.  No changes.  CURRENT MEDICATIONS: Reviewed per [facility]  REVIEW OF SYSTEMS:  GENERAL: no change in appetite, no fatigue, no weight changes, no fever, chills or weakness RESPIRATORY: no cough, SOB, DOE,, wheezing, hemoptysis CARDIAC: no chest pain, edema or palpitations GI: no abdominal pain, diarrhea, constipation, heart burn, nausea or vomiting  PHYSICAL EXAMINATION  GENERAL: no acute distress, morbidly obese body habitus EYES: conjunctivae normal, sclerae normal, normal eye lids NECK: supple, trachea midline, no neck masses, no thyroid tenderness, no thyromegaly LYMPHATICS: no LAN in the neck, no supraclavicular LAN RESPIRATORY: breathing is even & unlabored, BS CTAB CARDIAC: RRR, no murmur,no extra heart sounds EDEMA/VARICOSITIES: bilateral lower extremities wrapped GI: abdomen soft, normal BS, no masses, no tenderness, no hepatomegaly, no splenomegaly PSYCHIATRIC: the patient is alert & oriented to person, affect & behavior appropriate  ASSESSMENT/PLAN:  UTI.  New problem.  Doxycycline 100 mg b.i.d. for one week and Flora-Q b.i.d. for one week started.    Hypercalcemia.  New problem.  Likely dehydration.  She is on  calcium supplementation.  Therefore, we will reassess.    CPT CODE: 40981

## 2013-03-06 NOTE — Progress Notes (Signed)
Patient ID: [name], female   DOB: Mar 16, 1951, 62 y.o.   MRN: [id number]        PROGRESS NOTE  DATE: 02/13/2013  FACILITY:  [facility]  LEVEL OF CARE: SNF (31)  Acute Visit  CHIEF COMPLAINT:  Manage hypercalcemia.    HISTORY OF PRESENT ILLNESS: I was requested by the staff to assess the patient regarding above problem(s):  Patient's calcium level on 02/11/2013 was 11.  On 01/28/2013, calcium level was 10.4.  In 11/2012, calcium level was 9.7.  She is  currently on calcium supplementation.  She denies any neuromuscular problems.    PAST MEDICAL HISTORY : Reviewed.  No changes.  CURRENT MEDICATIONS: Reviewed per [facility]  REVIEW OF SYSTEMS:  GENERAL: no change in appetite, no fatigue, no weight changes, no fever, chills or weakness RESPIRATORY: no cough, SOB, DOE,, wheezing, hemoptysis CARDIAC: no chest pain, edema or palpitations GI: no abdominal pain, diarrhea, constipation, heart burn, nausea or vomiting  PHYSICAL EXAMINATION  GENERAL: no acute distress, morbidly obese body habitus NECK: supple, trachea midline, no neck masses, no thyroid tenderness, no thyromegaly RESPIRATORY: breathing is even & unlabored, BS CTAB CARDIAC: RRR, no murmur,no extra heart sounds, no edema GI: abdomen soft, normal BS, no masses, no tenderness, no hepatomegaly, no splenomegaly PSYCHIATRIC: the patient is alert & oriented to person, affect & behavior appropriate  ASSESSMENT/PLAN:  Hypercalcemia.  New problem.  Discontinue calcium and recheck calcium level in two weeks.    CPT CODE: 40981

## 2013-03-21 ENCOUNTER — Ambulatory Visit: Payer: Medicaid Other

## 2013-03-28 ENCOUNTER — Non-Acute Institutional Stay (SKILLED_NURSING_FACILITY): Payer: Medicaid Other | Admitting: Internal Medicine

## 2013-03-28 DIAGNOSIS — J449 Chronic obstructive pulmonary disease, unspecified: Secondary | ICD-10-CM

## 2013-03-28 DIAGNOSIS — I10 Essential (primary) hypertension: Secondary | ICD-10-CM

## 2013-03-28 DIAGNOSIS — D638 Anemia in other chronic diseases classified elsewhere: Secondary | ICD-10-CM

## 2013-03-28 DIAGNOSIS — E1149 Type 2 diabetes mellitus with other diabetic neurological complication: Secondary | ICD-10-CM

## 2013-03-29 NOTE — Progress Notes (Signed)
PROGRESS NOTE  DATE: 03/28/13  FACILITY: Nursing Home Location: [facility]  LEVEL OF CARE: SNF (31)  Routine Visit  CHIEF COMPLAINT:  Manage COPD, hypertension and anemia of chronic disease  HISTORY OF PRESENT ILLNESS:  REASSESSMENT OF ONGOING PROBLEM(S):  COPD: the COPD is stable.  Pt c/o sob &cough.  Denies wheezing.  No complications from the medications presently being used.  HTN: Pt 's HTN remains stable.  Denies CP, sob, DOE, headaches, dizziness or visual disturbances.  No complications from the medications currently being used.  Last BP : 146/94, 149/68, 138/64.  ANEMIA: The anemia has been stable. The patient denies fatigue, melena or hematochezia. No complications from the medications currently being used. In 11/13 hemoglobin 9.2 MCV 85, in 5/14 hemoglobin 10, MCV 83, 7-14 hemoglobin 11.9.  PAST MEDICAL HISTORY : Reviewed.  No changes.  CURRENT MEDICATIONS: Reviewed per [facility]  REVIEW OF SYSTEMS:  GENERAL: no change in appetite, no fatigue, no weight changes, no fever, chills or weakness RESPIRATORY: c/o cough, SOB, DOE,   Denies wheezing, hemoptysis CARDIAC: no chest pain, bilateral lower extremity edema present. No palpitations GI: no abdominal pain, diarrhea, constipation, heart burn, nausea or vomiting  PHYSICAL EXAMINATION  VS:  T 97.4      P 89      RR 20     BP 138/64     POX %     WT (Lb) 309  GENERAL: no acute distress, morbidly obese body habitus EYES: conjunctivae normal, sclerae normal, normal eye lids NECK: supple, trachea midline, no neck masses, no thyroid tenderness, no thyromegaly LYMPHATICS: no LAN in the neck, no supraclavicular LAN RESPIRATORY: breathing is even & unlabored, BS decreased bilaterally CARDIAC: RRR, no murmur,no extra heart sounds, +1 right lower extremity edema, +2 left lower extremity edema GI: abdomen soft, normal BS, no masses, no tenderness, no hepatomegaly, no splenomegaly PSYCHIATRIC: the patient is  alert & oriented to person, affect & behavior appropriate  LABS/RADIOLOGY:  8-14 calcium 9.8 7-14 CBC normal, calcium 10.4 otherwise BMP normal, Depakote level 47  5/14 WBC 11.5, hemoglobin 10, MCV 83, platelets 338, CMP normal, phospholipid panel normal, hemoglobin A1c 6.1, Depakote level 23  12/13 BMP normal, hemoglobin A1c 6.2, Depakote 46 11/13 hemoglobin 9.2, MCV 85 otherwise CBC normal, liver profile normal, fasting lipid panel normal  ASSESSMENT/PLAN:  COPD-unstable. Start Mucinex 600 mg twice a day for one week. hypertension-stable. anemia of chronic disease-hemoglobin improved.. diabetes mellitus with neuropathy-well-controlled. peripheral neuropathy-Neurontin was increased. constipation-well controlled. lower extremity edema -- stable depression-stable.  CPT CODE: 40981

## 2013-04-03 ENCOUNTER — Inpatient Hospital Stay (HOSPITAL_COMMUNITY)
Admission: EM | Admit: 2013-04-03 | Discharge: 2013-04-09 | DRG: 189 | Disposition: A | Discharge status: Skilled Nursing Facility | Payer: Medicaid Other | Attending: Internal Medicine | Admitting: Internal Medicine

## 2013-04-03 ENCOUNTER — Encounter (HOSPITAL_COMMUNITY): Payer: Self-pay | Admitting: Unknown Physician Specialty

## 2013-04-03 ENCOUNTER — Inpatient Hospital Stay (HOSPITAL_COMMUNITY): Payer: Medicaid Other

## 2013-04-03 ENCOUNTER — Emergency Department (HOSPITAL_COMMUNITY): Payer: Medicaid Other

## 2013-04-03 ENCOUNTER — Encounter: Payer: Self-pay | Admitting: Family

## 2013-04-03 ENCOUNTER — Non-Acute Institutional Stay (SKILLED_NURSING_FACILITY): Payer: Medicaid Other | Admitting: Family

## 2013-04-03 DIAGNOSIS — I2510 Atherosclerotic heart disease of native coronary artery without angina pectoris: Secondary | ICD-10-CM | POA: Diagnosis present

## 2013-04-03 DIAGNOSIS — E662 Morbid (severe) obesity with alveolar hypoventilation: Secondary | ICD-10-CM | POA: Diagnosis present

## 2013-04-03 DIAGNOSIS — F319 Bipolar disorder, unspecified: Secondary | ICD-10-CM | POA: Diagnosis present

## 2013-04-03 DIAGNOSIS — E1149 Type 2 diabetes mellitus with other diabetic neurological complication: Secondary | ICD-10-CM | POA: Diagnosis present

## 2013-04-03 DIAGNOSIS — R0602 Shortness of breath: Secondary | ICD-10-CM

## 2013-04-03 DIAGNOSIS — J189 Pneumonia, unspecified organism: Secondary | ICD-10-CM

## 2013-04-03 DIAGNOSIS — G4733 Obstructive sleep apnea (adult) (pediatric): Secondary | ICD-10-CM | POA: Diagnosis present

## 2013-04-03 DIAGNOSIS — K219 Gastro-esophageal reflux disease without esophagitis: Secondary | ICD-10-CM | POA: Diagnosis present

## 2013-04-03 DIAGNOSIS — J449 Chronic obstructive pulmonary disease, unspecified: Secondary | ICD-10-CM | POA: Diagnosis present

## 2013-04-03 DIAGNOSIS — Z91199 Patient's noncompliance with other medical treatment and regimen due to unspecified reason: Secondary | ICD-10-CM

## 2013-04-03 DIAGNOSIS — I10 Essential (primary) hypertension: Secondary | ICD-10-CM | POA: Diagnosis present

## 2013-04-03 DIAGNOSIS — J441 Chronic obstructive pulmonary disease with (acute) exacerbation: Secondary | ICD-10-CM | POA: Diagnosis present

## 2013-04-03 DIAGNOSIS — G9340 Encephalopathy, unspecified: Secondary | ICD-10-CM

## 2013-04-03 DIAGNOSIS — G252 Other specified forms of tremor: Secondary | ICD-10-CM

## 2013-04-03 DIAGNOSIS — E876 Hypokalemia: Secondary | ICD-10-CM | POA: Diagnosis present

## 2013-04-03 DIAGNOSIS — R0902 Hypoxemia: Secondary | ICD-10-CM | POA: Diagnosis present

## 2013-04-03 DIAGNOSIS — I5031 Acute diastolic (congestive) heart failure: Secondary | ICD-10-CM

## 2013-04-03 DIAGNOSIS — D638 Anemia in other chronic diseases classified elsewhere: Secondary | ICD-10-CM | POA: Diagnosis present

## 2013-04-03 DIAGNOSIS — Z6841 Body Mass Index (BMI) 40.0 and over, adult: Secondary | ICD-10-CM

## 2013-04-03 DIAGNOSIS — Z9119 Patient's noncompliance with other medical treatment and regimen: Secondary | ICD-10-CM

## 2013-04-03 DIAGNOSIS — R079 Chest pain, unspecified: Secondary | ICD-10-CM | POA: Diagnosis present

## 2013-04-03 DIAGNOSIS — J309 Allergic rhinitis, unspecified: Secondary | ICD-10-CM

## 2013-04-03 DIAGNOSIS — I739 Peripheral vascular disease, unspecified: Secondary | ICD-10-CM | POA: Diagnosis present

## 2013-04-03 DIAGNOSIS — J9620 Acute and chronic respiratory failure, unspecified whether with hypoxia or hypercapnia: Principal | ICD-10-CM | POA: Diagnosis present

## 2013-04-03 DIAGNOSIS — J4489 Other specified chronic obstructive pulmonary disease: Secondary | ICD-10-CM | POA: Diagnosis present

## 2013-04-03 DIAGNOSIS — R259 Unspecified abnormal involuntary movements: Secondary | ICD-10-CM

## 2013-04-03 DIAGNOSIS — I498 Other specified cardiac arrhythmias: Secondary | ICD-10-CM | POA: Diagnosis present

## 2013-04-03 DIAGNOSIS — I890 Lymphedema, not elsewhere classified: Secondary | ICD-10-CM

## 2013-04-03 DIAGNOSIS — E874 Mixed disorder of acid-base balance: Secondary | ICD-10-CM | POA: Diagnosis present

## 2013-04-03 DIAGNOSIS — E119 Type 2 diabetes mellitus without complications: Secondary | ICD-10-CM | POA: Diagnosis present

## 2013-04-03 DIAGNOSIS — R42 Dizziness and giddiness: Secondary | ICD-10-CM

## 2013-04-03 DIAGNOSIS — B951 Streptococcus, group B, as the cause of diseases classified elsewhere: Secondary | ICD-10-CM | POA: Diagnosis present

## 2013-04-03 DIAGNOSIS — I509 Heart failure, unspecified: Secondary | ICD-10-CM

## 2013-04-03 DIAGNOSIS — R7881 Bacteremia: Secondary | ICD-10-CM | POA: Diagnosis present

## 2013-04-03 DIAGNOSIS — I252 Old myocardial infarction: Secondary | ICD-10-CM

## 2013-04-03 DIAGNOSIS — J9610 Chronic respiratory failure, unspecified whether with hypoxia or hypercapnia: Secondary | ICD-10-CM

## 2013-04-03 LAB — POCT I-STAT 3, ART BLOOD GAS (G3+)
Acid-Base Excess: 30 mmol/L — ABNORMAL HIGH (ref 0.0–2.0)
Acid-Base Excess: 30 mmol/L — ABNORMAL HIGH (ref 0.0–2.0)
Bicarbonate: 60 mEq/L — ABNORMAL HIGH (ref 20.0–24.0)
Bicarbonate: 61.6 mEq/L — ABNORMAL HIGH (ref 20.0–24.0)
O2 Saturation: 96 %
Patient temperature: 100.9
Patient temperature: 39.1
TCO2: >50 mmol/L (ref 0–100)
pH, Arterial: 7.347 — ABNORMAL LOW (ref 7.350–7.450)
pH, Arterial: 7.397 (ref 7.350–7.450)

## 2013-04-03 LAB — URINE MICROSCOPIC-ADD ON

## 2013-04-03 LAB — URINALYSIS, ROUTINE W REFLEX MICROSCOPIC
Bilirubin Urine: NEGATIVE
Bilirubin Urine: NEGATIVE
Glucose, UA: NEGATIVE mg/dL
Ketones, ur: NEGATIVE mg/dL
Nitrite: NEGATIVE
Protein, ur: 30 mg/dL — AB
Protein, ur: 30 mg/dL — AB
Urobilinogen, UA: 0.2 mg/dL (ref 0.0–1.0)
Urobilinogen, UA: 0.2 mg/dL (ref 0.0–1.0)

## 2013-04-03 LAB — PROTIME-INR
INR: 0.93 (ref 0.00–1.49)
Prothrombin Time: 12.3 seconds (ref 11.6–15.2)

## 2013-04-03 LAB — COMPREHENSIVE METABOLIC PANEL
ALT: 18 U/L (ref 0–35)
Alkaline Phosphatase: 70 U/L (ref 39–117)
Alkaline Phosphatase: 60 U/L (ref 39–117)
BUN: 37 mg/dL — ABNORMAL HIGH (ref 6–23)
BUN: 36 mg/dL — ABNORMAL HIGH (ref 6–23)
CO2: >45 mEq/L (ref 19–32)
CO2: >45 mEq/L (ref 19–32)
Chloride: 84 mEq/L — ABNORMAL LOW (ref 96–112)
Creatinine, Ser: 0.82 mg/dL (ref 0.50–1.10)
GFR calc Af Amer: 89 mL/min — ABNORMAL LOW (ref >90)
GFR calc Af Amer: 86 mL/min — ABNORMAL LOW (ref >90)
GFR calc non Af Amer: 77 mL/min — ABNORMAL LOW (ref >90)
GFR calc non Af Amer: 75 mL/min — ABNORMAL LOW (ref >90)
Glucose, Bld: 133 mg/dL — ABNORMAL HIGH (ref 70–99)
Glucose, Bld: 141 mg/dL — ABNORMAL HIGH (ref 70–99)
Potassium: 3.3 mEq/L — ABNORMAL LOW (ref 3.5–5.1)
Potassium: 3.3 mEq/L — ABNORMAL LOW (ref 3.5–5.1)
Sodium: 137 mEq/L (ref 135–145)
Total Bilirubin: 0.3 mg/dL (ref 0.3–1.2)
Total Protein: 8.2 g/dL (ref 6.0–8.3)

## 2013-04-03 LAB — CBC WITH DIFFERENTIAL/PLATELET
Basophils Absolute: 0 10*3/uL (ref 0.0–0.1)
Eosinophils Absolute: 0 10*3/uL (ref 0.0–0.7)
Eosinophils Absolute: 0 10*3/uL (ref 0.0–0.7)
Eosinophils Relative: 0 % (ref 0–5)
Eosinophils Relative: 0 % (ref 0–5)
HCT: 33.5 % — ABNORMAL LOW (ref 36.0–46.0)
Hemoglobin: 11 g/dL — ABNORMAL LOW (ref 12.0–15.0)
Hemoglobin: 10.8 g/dL — ABNORMAL LOW (ref 12.0–15.0)
Lymphocytes Relative: 4 % — ABNORMAL LOW (ref 12–46)
Lymphocytes Relative: 3 % — ABNORMAL LOW (ref 12–46)
Lymphs Abs: 0.7 10*3/uL (ref 0.7–4.0)
Lymphs Abs: 0.6 10*3/uL — ABNORMAL LOW (ref 0.7–4.0)
MCH: 28.9 pg (ref 26.0–34.0)
MCV: 92.1 fL (ref 78.0–100.0)
MCV: 92.3 fL (ref 78.0–100.0)
Monocytes Absolute: 0.8 10*3/uL (ref 0.1–1.0)
Monocytes Relative: 3 % (ref 3–12)
Monocytes Relative: 4 % (ref 3–12)
Neutrophils Relative %: 93 % — ABNORMAL HIGH (ref 43–77)
RBC: 3.81 MIL/uL — ABNORMAL LOW (ref 3.87–5.11)
RDW: 13.8 % (ref 11.5–15.5)
WBC: 17.1 10*3/uL — ABNORMAL HIGH (ref 4.0–10.5)
WBC: 22.5 10*3/uL — ABNORMAL HIGH (ref 4.0–10.5)

## 2013-04-03 LAB — PHOSPHORUS: Phosphorus: 2.2 mg/dL — ABNORMAL LOW (ref 2.3–4.6)

## 2013-04-03 LAB — GLUCOSE, CAPILLARY: Glucose-Capillary: 153 mg/dL — ABNORMAL HIGH (ref 70–99)

## 2013-04-03 LAB — TROPONIN I
Troponin I: <0.3 ng/mL (ref <0.30)
Troponin I: <0.3 ng/mL (ref <0.30)

## 2013-04-03 LAB — PRO B NATRIURETIC PEPTIDE: Pro B Natriuretic peptide (BNP): 387.1 pg/mL — ABNORMAL HIGH (ref 0–125)

## 2013-04-03 MED ORDER — ALBUTEROL SULFATE (5 MG/ML) 0.5% IN NEBU
2.5000 mg | INHALATION_SOLUTION | RESPIRATORY_TRACT | Status: DC
  Administered 2013-04-03 – 2013-04-05 (×9): 2.5 mg via RESPIRATORY_TRACT
  Filled 2013-04-03 (×9): qty 0.5

## 2013-04-03 MED ORDER — ALBUTEROL SULFATE (5 MG/ML) 0.5% IN NEBU: 2.5000 mg | INHALATION_SOLUTION | RESPIRATORY_TRACT | Status: DC | PRN

## 2013-04-03 MED ORDER — LEVOFLOXACIN IN D5W 750 MG/150ML IV SOLN
750.0000 mg | Freq: Once | INTRAVENOUS | Status: AC
  Administered 2013-04-03: 750 mg via INTRAVENOUS
  Filled 2013-04-03: qty 150

## 2013-04-03 MED ORDER — PANTOPRAZOLE SODIUM 40 MG IV SOLR
40.0000 mg | INTRAVENOUS | Status: DC
  Administered 2013-04-03 – 2013-04-07 (×5): 40 mg via INTRAVENOUS
  Filled 2013-04-03 (×7): qty 40

## 2013-04-03 MED ORDER — POTASSIUM CHLORIDE 10 MEQ/100ML IV SOLN
10.0000 meq | INTRAVENOUS | Status: AC
  Administered 2013-04-03 – 2013-04-04 (×4): 10 meq via INTRAVENOUS
  Filled 2013-04-03 (×3): qty 100

## 2013-04-03 MED ORDER — VANCOMYCIN HCL IN DEXTROSE 1-5 GM/200ML-% IV SOLN
1000.0000 mg | Freq: Two times a day (BID) | INTRAVENOUS | Status: DC
  Administered 2013-04-04 – 2013-04-08 (×9): 1000 mg via INTRAVENOUS
  Filled 2013-04-03 (×11): qty 200

## 2013-04-03 MED ORDER — SODIUM CHLORIDE 0.9 % IV SOLN: 250.0000 mL | INTRAVENOUS | Status: DC | PRN

## 2013-04-03 MED ORDER — ALBUTEROL SULFATE (5 MG/ML) 0.5% IN NEBU: 2.5000 mg | INHALATION_SOLUTION | RESPIRATORY_TRACT | Status: DC

## 2013-04-03 MED ORDER — INSULIN ASPART 100 UNIT/ML ~~LOC~~ SOLN
2.0000 [IU] | SUBCUTANEOUS | Status: DC
  Administered 2013-04-04 (×3): 4 [IU] via SUBCUTANEOUS
  Administered 2013-04-04 (×2): 2 [IU] via SUBCUTANEOUS
  Administered 2013-04-04: 4 [IU] via SUBCUTANEOUS
  Administered 2013-04-05: 2 [IU] via SUBCUTANEOUS
  Administered 2013-04-05: 4 [IU] via SUBCUTANEOUS
  Administered 2013-04-05 (×3): 2 [IU] via SUBCUTANEOUS
  Administered 2013-04-06 (×2): 4 [IU] via SUBCUTANEOUS
  Administered 2013-04-06: 2 [IU] via SUBCUTANEOUS
  Administered 2013-04-06: 13:00:00 4 [IU] via SUBCUTANEOUS
  Administered 2013-04-06: 05:00:00 2 [IU] via SUBCUTANEOUS
  Administered 2013-04-07 (×4): 4 [IU] via SUBCUTANEOUS

## 2013-04-03 MED ORDER — SODIUM CHLORIDE 0.9 % IV SOLN
500.0000 mg | Freq: Four times a day (QID) | INTRAVENOUS | Status: DC
  Administered 2013-04-03 – 2013-04-08 (×17): 500 mg via INTRAVENOUS
  Filled 2013-04-03 (×22): qty 500

## 2013-04-03 MED ORDER — LEVOFLOXACIN IN D5W 750 MG/150ML IV SOLN
750.0000 mg | INTRAVENOUS | Status: DC
  Administered 2013-04-04: 750 mg via INTRAVENOUS
  Filled 2013-04-03 (×2): qty 150

## 2013-04-03 MED ORDER — IPRATROPIUM BROMIDE 0.02 % IN SOLN: 0.5000 mg | RESPIRATORY_TRACT | Status: DC

## 2013-04-03 MED ORDER — METHYLPREDNISOLONE SODIUM SUCC 125 MG IJ SOLR
60.0000 mg | Freq: Four times a day (QID) | INTRAMUSCULAR | Status: DC
  Administered 2013-04-03 – 2013-04-04 (×2): 60 mg via INTRAVENOUS
  Filled 2013-04-03 (×6): qty 0.96
  Filled 2013-04-03: qty 2

## 2013-04-03 MED ORDER — VANCOMYCIN HCL 10 G IV SOLR
2000.0000 mg | Freq: Once | INTRAVENOUS | Status: AC
  Administered 2013-04-03: 2000 mg via INTRAVENOUS
  Filled 2013-04-03: qty 2000

## 2013-04-03 MED ORDER — IPRATROPIUM BROMIDE 0.02 % IN SOLN
0.5000 mg | RESPIRATORY_TRACT | Status: DC
  Administered 2013-04-03 – 2013-04-05 (×9): 0.5 mg via RESPIRATORY_TRACT
  Filled 2013-04-03 (×9): qty 2.5

## 2013-04-03 MED ORDER — HEPARIN SODIUM (PORCINE) 5000 UNIT/ML IJ SOLN
5000.0000 [IU] | Freq: Three times a day (TID) | INTRAMUSCULAR | Status: DC
  Administered 2013-04-04 – 2013-04-09 (×16): 5000 [IU] via SUBCUTANEOUS
  Filled 2013-04-03 (×20): qty 1

## 2013-04-03 MED ORDER — SODIUM CHLORIDE 0.9 % IV SOLN
INTRAVENOUS | Status: DC
  Administered 2013-04-03 – 2013-04-05 (×3): via INTRAVENOUS

## 2013-04-03 NOTE — ED Notes (Signed)
Patient arrived via [facility] with chest pain. EMS states she was unresponsive but maintaining her airway and was placed on NRB. Patient has a history of respiratory failure with intubations. Patient moan during transport once and then nothing after.

## 2013-04-03 NOTE — Progress Notes (Signed)
Unit CM UR Completed by [facility] ED CM  W. Tarissa Kerin RN  

## 2013-04-03 NOTE — Progress Notes (Signed)
Patient transported from ED to ICU on BIPAP.  Tolerated well with no complications.  ICU RT at bedside upon arrival.  RT will monitor.

## 2013-04-03 NOTE — Progress Notes (Addendum)
ANTIBIOTIC CONSULT NOTE - INITIAL  Pharmacy Consult for vancomycin + primaxin Indication: rule out pneumonia  Allergies  Allergen Reactions  . Aspirin Other (See Comments)    Per MAR  . Bee Venom Other (See Comments)    Unknown  . Lisinopril     Per MAR  . Nyquil Cold & [Dm-Doxylamine-Acetaminophen] Other (See Comments)    Unknown   . Penicillins Other (See Comments)    unknown  . Ppd [Tuberculin Purified Protein Derivative] Other (See Comments)    unknown    Patient Measurements: Height: 5' 2.99" (160 cm) Weight: 282 lb 3 oz (128 kg) IBW/kg (Calculated) : 52.38 Adjusted Body Weight:   Vital Signs: Temp: 101.7 F (38.7 C) (10/01 1830) BP: 147/95 mmHg (10/01 1830) Pulse Rate: 117 (10/01 1830) Intake/Output from previous day:   Intake/Output from this shift:    Labs:  Recent Labs  04/03/13 1655  WBC 17.1*  HGB 11.0*  PLT 186  CREATININE 0.80   Estimated Creatinine Clearance: 93.9 ml/min (by C-G formula based on Cr of 0.8). No results found for this basename: VANCOTROUGH, VANCOPEAK, VANCORANDOM, GENTTROUGH, GENTPEAK, GENTRANDOM, TOBRATROUGH, TOBRAPEAK, TOBRARND, AMIKACINPEAK, AMIKACINTROU, AMIKACIN,  in the last 72 hours   Microbiology: No results found for this or any previous visit (from the past 720 hour(s)).  Medical History: Past Medical History  Diagnosis Date  . COPD   . Hypertension   . Chronic respiratory failure     3 L oxygen  . GERD (gastroesophageal reflux disease)   . Diabetes mellitus   . Obesity, Class III, BMI 40-49.9 (morbid obesity)   . Obstructive sleep apnea on CPAP   . Peripheral vascular disease   . Bipolar disease, chronic   . Lymphedema   . COPD (chronic obstructive pulmonary disease)   . Cellulitis   . Coronary artery disease   . Asthma   . Myocardial infarction   . Vertigo     Medications:  Anti-infectives   Start     Dose/Rate Route Frequency Ordered Stop   04/04/13 0900  vancomycin (VANCOCIN) IVPB 1000 mg/200 mL  premix     1,000 mg 200 mL/hr over 60 Minutes Intravenous Every 12 hours 04/03/13 1908     04/03/13 2000  imipenem-cilastatin (PRIMAXIN) 500 mg in sodium chloride 0.9 % 100 mL IVPB     500 mg 200 mL/hr over 30 Minutes Intravenous Every 6 hours 04/03/13 1908     04/03/13 1915  vancomycin (VANCOCIN) 2,000 mg in sodium chloride 0.9 % 500 mL IVPB     2,000 mg 250 mL/hr over 120 Minutes Intravenous  Once 04/03/13 1908     04/03/13 1730  levofloxacin (LEVAQUIN) IVPB 750 mg     750 mg 100 mL/hr over 90 Minutes Intravenous  Once 04/03/13 1720 04/03/13 1855     Assessment: 62 yof presented from SNF with AMS. To start empiric broad-spectrum abx including vancomycin and primaxin for HCAP. She has received 1x dose of levaquin in the ED. Tmax is 102.6 and WBC is elevated at 17.1. Scr 0.8.   Goal of Therapy:  Vancomycin trough level 15-20 mcg/ml  Plan:  1. Vancomycin 2gm IV x 1 then 1gm IV Q12H 2. Primaxin 500mg  IV Q6H 3. F/u renal fxn, C&S, clinical status and trough at [facility]  Pilot Prindle, Drake Leach 04/03/2013,7:10 PM  Addendum: Also continuing levaquin per pharmacy.   Plan: Levquin 750mg  IV Q24H starting tomorrow   Lysle Pearl, PharmD, BCPS Pager # [phone] 04/03/2013 9:00 PM

## 2013-04-03 NOTE — Progress Notes (Signed)
Increased pt. ipap per last abg. Called last result to RN in box.

## 2013-04-03 NOTE — Progress Notes (Signed)
Patient ID: [name], female   DOB: 1951/07/02, 62 y.o.   MRN: [id number] Date: 04/03/13  Facility: [facility]  Code Status:  Full  Chief Complaint  Patient presents with  . Acute Visit    Tremors and SOB   Pt reliability historian 3/5 Nursing staff historian 5/5 HPI: Nursing staff alerted NP of pt experiencing tremors to BUE and trunk. Nursing staff reports onset of tremors were 10 minutes- consistent in nature. Nursing staff endorses pt experienced similar tremors prior to her hospital course approx. 3 months ago.  Nursing staff reports pt required intubation after an acute attack of tremors. Pt and nursing staff reports they are unclear of the etiology and disease process of past tremors and present tremors. Pt reports associated SOB and L sided CP.  Pt denies N/V/D/fever; however endorses chills. Pt is unable to elaborate on associated symptoms of SOB, L sided CP, and chills at present.       Allergies  Allergen Reactions  . Bee Venom Other (See Comments)    Unknown  . Nyquil Cold & [Dm-Doxylamine-Acetaminophen] Other (See Comments)    Unknown   . Penicillins Other (See Comments)    unknown  . Ppd [Tuberculin Purified Protein Derivative] Other (See Comments)    unknown     Medication List       This list is accurate as of: 04/03/13  3:54 PM.  Always use your most recent med list.               albuterol (2.5 MG/3ML) 0.083% nebulizer solution  Commonly known as:  PROVENTIL  Take 2.5 mg by nebulization every 6 (six) hours as needed for wheezing.     amLODipine 5 MG tablet  Commonly known as:  NORVASC  Take 5 mg by mouth daily.     ARIPiprazole 10 MG tablet  Commonly known as:  ABILIFY  Take 10 mg by mouth daily. For depressive psychosis     ascorbic acid 500 MG tablet  Commonly known as:  VITAMIN C  Take 500 mg by mouth 2 (two) times daily.     aspirin 325 MG tablet  Take 325 mg by mouth daily.     budesonide-formoterol 160-4.5 MCG/ACT inhaler   Commonly known as:  SYMBICORT  Inhale 2 puffs into the lungs 2 (two) times daily.     busPIRone 10 MG tablet  Commonly known as:  BUSPAR  Take 10 mg by mouth 2 (two) times daily.     calcium-vitamin D 500-200 MG-UNIT per tablet  Commonly known as:  OSCAL WITH D  Take 1 tablet by mouth 2 (two) times daily.     CERTAGEN PO  Take 1 tablet by mouth daily.     cloNIDine 0.2 MG tablet  Commonly known as:  CATAPRES  Take 0.2 mg by mouth 2 (two) times daily.     divalproex 250 MG 24 hr tablet  Commonly known as:  DEPAKOTE ER  Take 1 tablet (250 mg total) by mouth 2 (two) times daily.     docusate sodium 100 MG capsule  Commonly known as:  COLACE  Take 100 mg by mouth daily.     DULoxetine 60 MG capsule  Commonly known as:  CYMBALTA  Take 60 mg by mouth 2 (two) times daily.     feeding supplement Liqd  Take 30 mLs by mouth 2 (two) times daily.     fluticasone 50 MCG/ACT nasal spray  Commonly known as:  FLONASE  Place  2 sprays into the nose daily.     folic acid 1 MG tablet  Commonly known as:  FOLVITE  Take 1 mg by mouth daily.     furosemide 40 MG tablet  Commonly known as:  LASIX  Take 1 tablet (40 mg total) by mouth 2 (two) times daily.     gabapentin 400 MG capsule  Commonly known as:  NEURONTIN  Take 400 mg by mouth 3 (three) times daily.     isosorbide mononitrate 30 MG 24 hr tablet  Commonly known as:  IMDUR  Take 30 mg by mouth daily.     lisinopril 5 MG tablet  Commonly known as:  PRINIVIL,ZESTRIL  Take 1 tablet (5 mg total) by mouth daily.     metoCLOPramide 5 MG tablet  Commonly known as:  REGLAN  Take 5 mg by mouth 3 (three) times daily.     metolazone 5 MG tablet  Commonly known as:  ZAROXOLYN  Take 1 tablet (5 mg total) by mouth 3 days.     nitroGLYCERIN 0.4 MG SL tablet  Commonly known as:  NITROSTAT  Place 0.4 mg under the tongue every 5 (five) minutes as needed for chest pain.     oxyCODONE-acetaminophen 5-325 MG per tablet  Commonly  known as:  PERCOCET/ROXICET  Take 1 tablet by mouth every 8 (eight) hours as needed. For pain     pantoprazole 40 MG tablet  Commonly known as:  PROTONIX  Take 40 mg by mouth 2 (two) times daily.     PATADAY 0.2 % Soln  Generic drug:  Olopatadine HCl  Place 1 drop into both eyes daily.     senna 8.6 MG Tabs tablet  Commonly known as:  SENOKOT  Take 2 tablets by mouth daily.     simethicone 80 MG chewable tablet  Commonly known as:  MYLICON  Chew 80 mg by mouth every 8 (eight) hours.     SYSTANE BALANCE OP  Place 1 drop into both eyes 2 (two) times daily.     tiotropium 18 MCG inhalation capsule  Commonly known as:  SPIRIVA  Place 18 mcg into inhaler and inhale daily.     vitamin D (CHOLECALCIFEROL) 400 UNITS tablet  Take 400 Units by mouth daily.         DATA REVIEWED  Radiologic Exams:   Cardiovascular Exams:   Laboratory Studies: 02/27/13-Calcium 9.8 01/28/13-WBC 8.2, RBC 4.41, Hemoglobin 11.9, Hematocrit 37.9, Platelets 357, Glucose 108, BUN 38, Creatinine 1, Na 145, Potassium 4.3, Chloride 84, Valproic Acid 47     Past Medical History  Diagnosis Date  . COPD   . Hypertension   . Chronic respiratory failure     3 L oxygen  . GERD (gastroesophageal reflux disease)   . Diabetes mellitus   . Obesity, Class III, BMI 40-49.9 (morbid obesity)   . Obstructive sleep apnea on CPAP   . Peripheral vascular disease   . Bipolar disease, chronic   . Lymphedema   . COPD (chronic obstructive pulmonary disease)   . Cellulitis   . Coronary artery disease   . Asthma   . Myocardial infarction   . Vertigo     Past Surgical History  Procedure Laterality Date  . Abdominal hysterectomy    . Cholecystectomy    . Appendectomy    . Tubal ligation      History   Social History  . Marital Status: Single    Spouse Name: N/A    Number of  Children: N/A  . Years of Education: N/A   Occupational History  . Not on file.   Social History Main Topics  . Smoking status:  Never Smoker   . Smokeless tobacco: Never Used  . Alcohol Use: No  . Drug Use: No  . Sexual Activity: No   Other Topics Concern  . Not on file   Social History Narrative  . No narrative on file     Review of Systems  Constitutional: Positive for chills.  HENT: Negative.   Eyes: Negative.   Respiratory: Positive for shortness of breath.   Cardiovascular: Positive for chest pain.  Gastrointestinal: Negative.   Genitourinary: Negative.   Musculoskeletal: Negative.   Skin: Negative.   Neurological: Positive for tremors.  Endo/Heme/Allergies: Negative.   Psychiatric/Behavioral: Negative.      Physical Exam Filed Vitals:   04/03/13 1544 04/03/13 1545  BP: 190/92   Pulse: 120   Temp: 97.4 F (36.3 C)   Resp: 34   SpO2: 91% 4%   There is no weight on file to calculate BMI. Physical Exam  Constitutional: She appears distressed.  Pt in supine position in Semi-Fowler's in facility bed. Safety rails in place/safety precautions ensured  HENT:  Head: Normocephalic.  Mouth/Throat: Oropharynx is clear and moist.  Cardiovascular: Regular rhythm.  Tachycardia present.   Pulmonary/Chest: Accessory muscle usage present. Tachypnea noted. She is in respiratory distress. She has wheezes in the left upper field, the left middle field and the left lower field.  Abdominal: Soft.  Obese  Neurological: She is alert. GCS eye subscore is 4. GCS verbal subscore is 4. GCS motor subscore is 6.  Alert to self/ disoriented to place, time and situation. Tremors to BUE/ trunk at rest and with movement  Skin: Skin is warm.    ASSESSMENT/PLAN  Acute Respiratory distress-[place] at 4 L pt placed in High Fowler's, EMS notified-pt transferred to ED Tremors-Safety Measures in place; side rails up x2; NP, RN, and CNA by [facility]; EMS notified-pt transferred to ED Family and administration by Arnette Norris, nurse    Follow up:PRN Time Spent 50 minutes

## 2013-04-03 NOTE — H&P (Signed)
PULMONARY  / CRITICAL CARE MEDICINE  Name: [name] MRN: [id number] DOB: [birth year]    ADMISSION DATE:  04/03/2013 CONSULTATION DATE:  04/03/2013  REFERRING MD :  Dr. Freida Busman PRIMARY SERVICE: EDP  CHIEF COMPLAINT:  AMS  BRIEF PATIENT DESCRIPTION: 62 year old female with history of COPD, obesity hypoventilation and multiple previous episodes of respiratory failure who was found unresponsive by SNF staff and EMS was called.  When EMS arrived the patient was altered but was able to protect her airway.  Patient was placed on 100% NRB and brought to the ER where she was placed on BiPAP and showed signs of improvement.  PCCM was called on consultation.  SIGNIFICANT EVENTS / STUDIES:  Respiratory failure in SNF 10/1.  LINES / TUBES: BiPAP PIV  CULTURES: Blood 10/1>>> Sputum 10/1>>> Urine 10/1>>>  ANTIBIOTICS: Vancomycin 10/1>>> Zosyn 10/1>>>  PAST MEDICAL HISTORY :  Past Medical History  Diagnosis Date  . COPD   . Hypertension   . Chronic respiratory failure     3 L oxygen  . GERD (gastroesophageal reflux disease)   . Diabetes mellitus   . Obesity, Class III, BMI 40-49.9 (morbid obesity)   . Obstructive sleep apnea on CPAP   . Peripheral vascular disease   . Bipolar disease, chronic   . Lymphedema   . COPD (chronic obstructive pulmonary disease)   . Cellulitis   . Coronary artery disease   . Asthma   . Myocardial infarction   . Vertigo    Past Surgical History  Procedure Laterality Date  . Abdominal hysterectomy    . Cholecystectomy    . Appendectomy    . Tubal ligation     Prior to Admission medications   Medication Sig Start Date End Date Taking? Authorizing Provider  albuterol (PROVENTIL) (2.5 MG/3ML) 0.083% nebulizer solution Take 2.5 mg by nebulization every 6 (six) hours as needed for wheezing.   Yes Historical Provider, MD  amLODipine (NORVASC) 5 MG tablet Take 5 mg by mouth daily.   Yes Historical Provider, MD  ARIPiprazole (ABILIFY) 10 MG tablet  Take 10 mg by mouth daily. For depressive psychosis   Yes Historical Provider, MD  ascorbic acid (VITAMIN C) 500 MG tablet Take 500 mg by mouth 2 (two) times daily.   Yes Historical Provider, MD  budesonide-formoterol (SYMBICORT) 160-4.5 MCG/ACT inhaler Inhale 2 puffs into the lungs 2 (two) times daily.   Yes Historical Provider, MD  busPIRone (BUSPAR) 10 MG tablet Take 10 mg by mouth 2 (two) times daily.   Yes Historical Provider, MD  cloNIDine (CATAPRES) 0.2 MG tablet Take 0.2 mg by mouth 2 (two) times daily.   Yes Historical Provider, MD  divalproex (DEPAKOTE ER) 250 MG 24 hr tablet Take 1 tablet (250 mg total) by mouth 2 (two) times daily. 01/23/13  Yes Richarda Overlie, MD  docusate sodium (COLACE) 100 MG capsule Take 100 mg by mouth daily.   Yes Historical Provider, MD  DULoxetine (CYMBALTA) 60 MG capsule Take 60 mg by mouth 2 (two) times daily.   Yes Historical Provider, MD  feeding supplement (PRO-STAT SUGAR FREE 64) LIQD Take 30 mLs by mouth 2 (two) times daily.   Yes Historical Provider, MD  fluticasone (FLONASE) 50 MCG/ACT nasal spray Place 2 sprays into the nose daily.   Yes Historical Provider, MD  folic acid (FOLVITE) 1 MG tablet Take 1 mg by mouth daily.   Yes Historical Provider, MD  furosemide (LASIX) 40 MG tablet Take 1 tablet (40 mg total)  by mouth 2 (two) times daily. 01/23/13  Yes Richarda Overlie, MD  gabapentin (NEURONTIN) 600 MG tablet Take 600 mg by mouth 3 (three) times daily.   Yes Historical Provider, MD  isosorbide mononitrate (IMDUR) 30 MG 24 hr tablet Take 30 mg by mouth daily.   Yes Historical Provider, MD  metoCLOPramide (REGLAN) 5 MG tablet Take 5 mg by mouth 3 (three) times daily.   Yes Historical Provider, MD  metolazone (ZAROXOLYN) 5 MG tablet Take 5 mg by mouth every 3 (three) days.   Yes Historical Provider, MD  Multiple Vitamins-Minerals (CERTAGEN PO) Take 1 tablet by mouth daily.   Yes Historical Provider, MD  nitroGLYCERIN (NITROSTAT) 0.4 MG SL tablet Place 0.4 mg  under the tongue every 5 (five) minutes as needed for chest pain.    Yes Historical Provider, MD  oxyCODONE-acetaminophen (PERCOCET/ROXICET) 5-325 MG per tablet Take 1 tablet by mouth every 8 (eight) hours as needed. For pain 01/23/13  Yes Richarda Overlie, MD  senna (SENOKOT) 8.6 MG TABS Take 2 tablets by mouth daily.   Yes Historical Provider, MD  simethicone (MYLICON) 80 MG chewable tablet Chew 80 mg by mouth every 8 (eight) hours.   Yes Historical Provider, MD  tiotropium (SPIRIVA) 18 MCG inhalation capsule Place 18 mcg into inhaler and inhale daily.   Yes Historical Provider, MD  vitamin D, CHOLECALCIFEROL, 400 UNITS tablet Take 400 Units by mouth daily.   Yes Historical Provider, MD   Allergies  Allergen Reactions  . Aspirin Other (See Comments)    Per MAR  . Bee Venom Other (See Comments)    Unknown  . Lisinopril     Per MAR  . Nyquil Cold & [Dm-Doxylamine-Acetaminophen] Other (See Comments)    Unknown   . Penicillins Other (See Comments)    unknown  . Ppd [Tuberculin Purified Protein Derivative] Other (See Comments)    unknown    FAMILY HISTORY:  Family History  Problem Relation Age of Onset  . Prostate cancer     SOCIAL HISTORY:  reports that she has never smoked. She has never used smokeless tobacco. She reports that she does not drink alcohol or use illicit drugs.  REVIEW OF SYSTEMS:  Unattainable.  Patient is very lethargic.  SUBJECTIVE:   VITAL SIGNS: Temp:  [97.4 F (36.3 C)-102.6 F (39.2 C)] 101.7 F (38.7 C) (10/01 1830) Pulse Rate:  [117-131] 117 (10/01 1830) Resp:  [19-34] 19 (10/01 1830) BP: (118-190)/(71-103) 147/95 mmHg (10/01 1830) SpO2:  [4 %-100 %] 96 % (10/01 1830) FiO2 (%):  [35 %] 35 % (10/01 1750) HEMODYNAMICS:   VENTILATOR SETTINGS: Vent Mode:  [-] BIPAP FiO2 (%):  [35 %] 35 % Set Rate:  [30 bmp] 30 bmp INTAKE / OUTPUT: Intake/Output   None     PHYSICAL EXAMINATION: General:  Chronically ill appearing obese female.  Arousable. Neuro:   Arousable and protecting her airway, moves all ext to command. HEENT:  [place]/AT, PERRL, EOM-I and MMM. Cardiovascular:  RRR, Nl S1/S2, -M/R/G. Lungs:  Diffuse end exp wheezing. Abdomen:  Obese, soft, NT, ND and +BS. Musculoskeletal:  1+ edema and -tenderness. Skin:  Intact.  LABS:  CBC Recent Labs     04/03/13  1655  WBC  17.1*  HGB  11.0*  HCT  35.1*  PLT  186   Coag's No results found for this basename: APTT, INR,  in the last 72 hours BMET Recent Labs     04/03/13  1655  NA  137  K  3.3*  CL  79*  CO2  >45*  BUN  37*  CREATININE  0.80  GLUCOSE  133*   Electrolytes Recent Labs     04/03/13  1655  CALCIUM  10.7*   Sepsis Markers No results found for this basename: LACTICACIDVEN, PROCALCITON, O2SATVEN,  in the last 72 hours ABG Recent Labs     04/03/13  1709  PHART  7.347*  PCO2ART  114.7*  PO2ART  104.0*   Liver Enzymes Recent Labs     04/03/13  1655  AST  35  ALT  18  ALKPHOS  70  BILITOT  0.3  ALBUMIN  3.9   Cardiac Enzymes Recent Labs     04/03/13  1655  TROPONINI  <0.30   Glucose No results found for this basename: GLUCAP,  in the last 72 hours  Imaging Dg Chest Port 1 View  04/03/2013   CLINICAL DATA:  Shortness of Breath  EXAM: PORTABLE CHEST - 1 VIEW  COMPARISON:  January 21, 2013  FINDINGS: There is right base consolidation. Lungs are otherwise clear. Degree of inspiration shallow. Heart is upper normal in size with normal pulmonary vascularity. No adenopathy.  IMPRESSION: Right base consolidation.   Electronically Signed   By: Bretta Bang   On: 04/03/2013 17:03     CXR: RLL infiltrate, low lung volume.  ASSESSMENT / PLAN:  PULMONARY A: COPD exacerbation with RLL infiltrate. P:   - BiPAP. - Intubate only if mental status deteriorates. - Steroids. - Vanc/zosy (patient comes from a SNF).  CARDIOVASCULAR A: Fluctuant BP with history of HTN. P:  - Hold anti-HTN. - No IVF. - Troponins as ordered. - 2D echo. - BNP. - No  lasix for now but will likely need some. - EKG.  RENAL A:  Very high bicarb and hypokalemia. P:   - KVO IVF. - Replace K. - BMET in AM.  GASTROINTESTINAL A:  No active issues. P:   - NPO due to high risk of respiratory failure.  HEMATOLOGIC A:  Leukocytosis with likely PNA. P:  - CBC in AM.  INFECTIOUS A:  PNA, likely HCAP from SNF. P:   - Vanc/zosyn. - Pan culture.  ENDOCRINE A:  DM.   P:   - ISS and CBG.  NEUROLOGIC A:  AMS and tremor likely due to high CO2. P:   - BiPAP. - CT of the head. - If head CT is negative then will need to add SQ heparin but in the meantime SCDs only.  TODAY'S SUMMARY: Respiratory failure due to COPD exacerbation and PNA in a patient that is morbidly obese.  BiPAP.  F/u ABG.  Head CT and monitor.  I have personally obtained a history, examined the patient, evaluated laboratory and imaging results, formulated the assessment and plan and placed orders.  CRITICAL CARE: The patient is critically ill with multiple organ systems failure and requires high complexity decision making for assessment and support, frequent evaluation and titration of therapies, application of advanced monitoring technologies and extensive interpretation of multiple databases. Critical Care Time devoted to patient care services described in this note is 45 minutes.   Alyson Reedy, M.D. Pulmonary and Critical Care Medicine [facility] Pager: [phone]  04/03/2013, 6:44 PM

## 2013-04-03 NOTE — ED Notes (Signed)
Patient arrived on NRB. Removed and placed on 50% Veni mask. Patient has become more alert and is able to answer some questions.

## 2013-04-03 NOTE — ED Provider Notes (Signed)
CSN: [id number]     Arrival date & time 04/03/13  1622 History   First MD Initiated Contact with Patient 04/03/13 1624     Chief Complaint  Patient presents with  . Chest Pain   (Consider location/radiation/quality/duration/timing/severity/associated sxs/prior Treatment) Patient is a 62 y.o. female presenting with chest pain. The history is provided by the police, medical records and the nursing home. The history is limited by the condition of the patient.  Chest Pain  patient here with altered mental status from nursing home and fever. He was reported to show complaint of chest pain prior to arrival when EMS got there the patient was unresponsive. She has no history of respiratory failure. CBG was 200. She was placed on a nonrebreather and transported here. No further history obtainable.  Past Medical History  Diagnosis Date  . COPD   . Hypertension   . Chronic respiratory failure     3 L oxygen  . GERD (gastroesophageal reflux disease)   . Diabetes mellitus   . Obesity, Class III, BMI 40-49.9 (morbid obesity)   . Obstructive sleep apnea on CPAP   . Peripheral vascular disease   . Bipolar disease, chronic   . Lymphedema   . COPD (chronic obstructive pulmonary disease)   . Cellulitis   . Coronary artery disease   . Asthma   . Myocardial infarction   . Vertigo    Past Surgical History  Procedure Laterality Date  . Abdominal hysterectomy    . Cholecystectomy    . Appendectomy    . Tubal ligation     Family History  Problem Relation Age of Onset  . Prostate cancer     History  Substance Use Topics  . Smoking status: Never Smoker   . Smokeless tobacco: Never Used  . Alcohol Use: No   OB History   Grav Para Term Preterm Abortions TAB SAB Ect Mult Living                 Review of Systems  Unable to perform ROS Cardiovascular: Positive for chest pain.    Allergies  Bee venom; Nyquil cold &; Penicillins; and Ppd  Home Medications   Current Outpatient Rx  Name   Route  Sig  Dispense  Refill  . albuterol (PROVENTIL) (2.5 MG/3ML) 0.083% nebulizer solution   Nebulization   Take 2.5 mg by nebulization every 6 (six) hours as needed for wheezing.         Marland Kitchen amLODipine (NORVASC) 5 MG tablet   Oral   Take 5 mg by mouth daily.         . ARIPiprazole (ABILIFY) 10 MG tablet   Oral   Take 10 mg by mouth daily. For depressive psychosis         . ascorbic acid (VITAMIN C) 500 MG tablet   Oral   Take 500 mg by mouth 2 (two) times daily.         Marland Kitchen aspirin 325 MG tablet   Oral   Take 325 mg by mouth daily.         . budesonide-formoterol (SYMBICORT) 160-4.5 MCG/ACT inhaler   Inhalation   Inhale 2 puffs into the lungs 2 (two) times daily.         . busPIRone (BUSPAR) 10 MG tablet   Oral   Take 10 mg by mouth 2 (two) times daily.         . calcium-vitamin D (OSCAL WITH D) 500-200 MG-UNIT per tablet   Oral  Take 1 tablet by mouth 2 (two) times daily.         . cloNIDine (CATAPRES) 0.2 MG tablet   Oral   Take 0.2 mg by mouth 2 (two) times daily.         . divalproex (DEPAKOTE ER) 250 MG 24 hr tablet   Oral   Take 1 tablet (250 mg total) by mouth 2 (two) times daily.   60 tablet   0   . docusate sodium (COLACE) 100 MG capsule   Oral   Take 100 mg by mouth daily.         . DULoxetine (CYMBALTA) 60 MG capsule   Oral   Take 60 mg by mouth 2 (two) times daily.         . feeding supplement (PRO-STAT SUGAR FREE 64) LIQD   Oral   Take 30 mLs by mouth 2 (two) times daily.         . fluticasone (FLONASE) 50 MCG/ACT nasal spray   Nasal   Place 2 sprays into the nose daily.         . folic acid (FOLVITE) 1 MG tablet   Oral   Take 1 mg by mouth daily.         . furosemide (LASIX) 40 MG tablet   Oral   Take 1 tablet (40 mg total) by mouth 2 (two) times daily.   60 tablet   2   . gabapentin (NEURONTIN) 400 MG capsule   Oral   Take 400 mg by mouth 3 (three) times daily.         . isosorbide mononitrate  (IMDUR) 30 MG 24 hr tablet   Oral   Take 30 mg by mouth daily.         Marland Kitchen lisinopril (PRINIVIL,ZESTRIL) 5 MG tablet   Oral   Take 1 tablet (5 mg total) by mouth daily.   30 tablet   0   . metoCLOPramide (REGLAN) 5 MG tablet   Oral   Take 5 mg by mouth 3 (three) times daily.         . metolazone (ZAROXOLYN) 5 MG tablet   Oral   Take 1 tablet (5 mg total) by mouth 3 days.   30 tablet   0   . Multiple Vitamins-Minerals (CERTAGEN PO)   Oral   Take 1 tablet by mouth daily.         . nitroGLYCERIN (NITROSTAT) 0.4 MG SL tablet   Sublingual   Place 0.4 mg under the tongue every 5 (five) minutes as needed for chest pain.          Marland Kitchen Olopatadine HCl (PATADAY) 0.2 % SOLN   Both Eyes   Place 1 drop into both eyes daily.          Marland Kitchen oxyCODONE-acetaminophen (PERCOCET/ROXICET) 5-325 MG per tablet   Oral   Take 1 tablet by mouth every 8 (eight) hours as needed. For pain   30 tablet   0   . pantoprazole (PROTONIX) 40 MG tablet   Oral   Take 40 mg by mouth 2 (two) times daily.         Marland Kitchen Propylene Glycol (SYSTANE BALANCE OP)   Both Eyes   Place 1 drop into both eyes 2 (two) times daily.         Marland Kitchen senna (SENOKOT) 8.6 MG TABS   Oral   Take 2 tablets by mouth daily.         Marland Kitchen  simethicone (MYLICON) 80 MG chewable tablet   Oral   Chew 80 mg by mouth every 8 (eight) hours.         Marland Kitchen tiotropium (SPIRIVA) 18 MCG inhalation capsule   Inhalation   Place 18 mcg into inhaler and inhale daily.         . vitamin D, CHOLECALCIFEROL, 400 UNITS tablet   Oral   Take 400 Units by mouth daily.          SpO2 100% Physical Exam  Nursing note and vitals reviewed. Constitutional: She appears well-developed and well-nourished. She appears lethargic. She appears toxic. She has a sickly appearance. She appears ill. She appears distressed.  HENT:  Head: Normocephalic and atraumatic.  Eyes: Conjunctivae, EOM and lids are normal. Pupils are equal, round, and reactive to light.   Neck: Normal range of motion. Neck supple. No tracheal deviation present. No mass present.  Cardiovascular: Regular rhythm and normal heart sounds.  Tachycardia present.  Exam reveals no gallop.   No murmur heard. Pulmonary/Chest: No stridor. She is in respiratory distress. She has decreased breath sounds. She has no wheezes. She has no rhonchi. She has no rales.  Abdominal: Soft. Normal appearance and bowel sounds are normal. She exhibits no distension. There is no tenderness. There is no rebound and no CVA tenderness.  Musculoskeletal: Normal range of motion. She exhibits no edema and no tenderness.  Neurological: She appears lethargic. GCS eye subscore is 3. GCS verbal subscore is 2. GCS motor subscore is 3.  Skin: Skin is warm and dry. No abrasion and no rash noted.    ED Course  Procedures (including critical care time) Labs Review Labs Reviewed  CULTURE, BLOOD (ROUTINE X 2)  CULTURE, BLOOD (ROUTINE X 2)  CBC WITH DIFFERENTIAL  COMPREHENSIVE METABOLIC PANEL  TROPONIN I  URINALYSIS, ROUTINE W REFLEX MICROSCOPIC   Imaging Review No results found.  MDM  No diagnosis found.  Date: 04/03/2013  Rate: 130  Rhythm: sinus tachycardia  QRS Axis: normal  Intervals: normal  ST/T Wave abnormalities: nonspecific ST changes  Conduction Disutrbances:none  Narrative Interpretation:   Old EKG Reviewed: none available   Patient presented and respiratory distress on 100% non-rebreather mask. Patient has a known history of COPD. Patient placed on 50% Ventimask and monitor. Patient gradually became more responsive. She was noted to be febrile. Was given Tylenol rectally. Blood cultures and lightheaded and ordered. Chest x-ray consistent with pneumonia and antibiotic started. Blood gas shows CO2 retention with a normal pH. She is mentating more appropriate at this time does not require emergency airway intervention. Patient continuously monitored and has gradually improved. Spoke with critical  care and patient be admitted to the ICU.  CRITICAL CARE Performed by: Toy Baker Total critical care time: 50 Critical care time was exclusive of separately billable procedures and treating other patients. Critical care was necessary to treat or prevent imminent or life-threatening deterioration. Critical care was time spent personally by me on the following activities: development of treatment plan with patient and/or surrogate as well as nursing, discussions with consultants, evaluation of patient's response to treatment, examination of patient, obtaining history from patient or surrogate, ordering and performing treatments and interventions, ordering and review of laboratory studies, ordering and review of radiographic studies, pulse oximetry and re-evaluation of patient's condition.       Toy Baker, MD 04/03/13 [phone]

## 2013-04-04 ENCOUNTER — Inpatient Hospital Stay (HOSPITAL_COMMUNITY): Payer: Medicaid Other

## 2013-04-04 DIAGNOSIS — J9620 Acute and chronic respiratory failure, unspecified whether with hypoxia or hypercapnia: Secondary | ICD-10-CM | POA: Insufficient documentation

## 2013-04-04 DIAGNOSIS — I509 Heart failure, unspecified: Secondary | ICD-10-CM

## 2013-04-04 LAB — MRSA PCR SCREENING: MRSA by PCR: NEGATIVE

## 2013-04-04 LAB — POCT I-STAT 3, ART BLOOD GAS (G3+)
Acid-Base Excess: >30 mmol/L — ABNORMAL HIGH (ref 0.0–2.0)
Acid-base deficit: >30 mmol/L — ABNORMAL HIGH (ref 0.0–2.0)
Bicarbonate: 61.3 mEq/L — ABNORMAL HIGH (ref 20.0–24.0)
Bicarbonate: 59.1 meq/L — ABNORMAL HIGH (ref 20.0–24.0)
O2 Saturation: 95 %
Patient temperature: 101
TCO2: >50 mmol/L (ref 0–100)
pCO2 arterial: 87 mmHg (ref 35.0–45.0)
pCO2 arterial: 83.1 mmHg (ref 35.0–45.0)
pH, Arterial: 7.456 — ABNORMAL HIGH (ref 7.350–7.450)
pH, Arterial: 7.465 — ABNORMAL HIGH (ref 7.350–7.450)
pO2, Arterial: 63 mmHg — ABNORMAL LOW (ref 80.0–100.0)
pO2, Arterial: 84 mmHg (ref 80.0–100.0)

## 2013-04-04 LAB — GLUCOSE, CAPILLARY
Glucose-Capillary: 136 mg/dL — ABNORMAL HIGH (ref 70–99)
Glucose-Capillary: 150 mg/dL — ABNORMAL HIGH (ref 70–99)
Glucose-Capillary: 152 mg/dL — ABNORMAL HIGH (ref 70–99)
Glucose-Capillary: 153 mg/dL — ABNORMAL HIGH (ref 70–99)
Glucose-Capillary: 174 mg/dL — ABNORMAL HIGH (ref 70–99)

## 2013-04-04 LAB — CBC WITH DIFFERENTIAL/PLATELET
Basophils Absolute: 0 10*3/uL (ref 0.0–0.1)
Basophils Relative: 0 % (ref 0–1)
Eosinophils Absolute: 0 10*3/uL (ref 0.0–0.7)
Eosinophils Relative: 0 % (ref 0–5)
HCT: 32.4 % — ABNORMAL LOW (ref 36.0–46.0)
Hemoglobin: 10.3 g/dL — ABNORMAL LOW (ref 12.0–15.0)
Lymphocytes Relative: 4 % — ABNORMAL LOW (ref 12–46)
Lymphs Abs: 1 10*3/uL (ref 0.7–4.0)
MCH: 29 pg (ref 26.0–34.0)
MCHC: 31.8 g/dL (ref 30.0–36.0)
MCV: 91.3 fL (ref 78.0–100.0)
Monocytes Absolute: 0.5 10*3/uL (ref 0.1–1.0)
Monocytes Relative: 2 % — ABNORMAL LOW (ref 3–12)
Neutro Abs: 24.2 10*3/uL — ABNORMAL HIGH (ref 1.7–7.7)
Neutrophils Relative %: 94 % — ABNORMAL HIGH (ref 43–77)
Platelets: 178 10*3/uL (ref 150–400)
RBC: 3.55 MIL/uL — ABNORMAL LOW (ref 3.87–5.11)
RDW: 14 % (ref 11.5–15.5)
WBC: 25.7 10*3/uL — ABNORMAL HIGH (ref 4.0–10.5)

## 2013-04-04 LAB — CBC
HCT: 33.3 % — ABNORMAL LOW (ref 36.0–46.0)
MCH: 29.6 pg (ref 26.0–34.0)
MCV: 91.2 fL (ref 78.0–100.0)
Platelets: 167 10*3/uL (ref 150–400)
RBC: 3.65 MIL/uL — ABNORMAL LOW (ref 3.87–5.11)
RDW: 14 % (ref 11.5–15.5)
WBC: 28.2 10*3/uL — ABNORMAL HIGH (ref 4.0–10.5)

## 2013-04-04 LAB — MAGNESIUM: Magnesium: 1.5 mg/dL (ref 1.5–2.5)

## 2013-04-04 LAB — BASIC METABOLIC PANEL
BUN: 34 mg/dL — ABNORMAL HIGH (ref 6–23)
CO2: >45 mEq/L (ref 19–32)
Calcium: 10.2 mg/dL (ref 8.4–10.5)
Chloride: 87 mEq/L — ABNORMAL LOW (ref 96–112)
Creatinine, Ser: 0.75 mg/dL (ref 0.50–1.10)
GFR calc non Af Amer: 88 mL/min — ABNORMAL LOW (ref >90)
Potassium: 4 mEq/L (ref 3.5–5.1)

## 2013-04-04 LAB — TROPONIN I
Troponin I: <0.3 ng/mL (ref <0.30)
Troponin I: <0.3 ng/mL (ref <0.30)

## 2013-04-04 MED ORDER — BIOTENE DRY MOUTH MT LIQD
15.0000 mL | Freq: Two times a day (BID) | OROMUCOSAL | Status: DC
Start: 2013-04-04 — End: 2013-04-09
  Administered 2013-04-04 – 2013-04-08 (×10): 15 mL via OROMUCOSAL

## 2013-04-04 MED ORDER — MAGNESIUM SULFATE 40 MG/ML IJ SOLN
4.0000 g | Freq: Once | INTRAMUSCULAR | Status: AC
  Administered 2013-04-04: 4 g via INTRAVENOUS
  Filled 2013-04-04: qty 100

## 2013-04-04 MED ORDER — METHYLPREDNISOLONE SODIUM SUCC 125 MG IJ SOLR
60.0000 mg | Freq: Four times a day (QID) | INTRAMUSCULAR | Status: DC
  Administered 2013-04-04: 60 mg via INTRAVENOUS
  Filled 2013-04-04 (×5): qty 0.96

## 2013-04-04 MED ORDER — CHLORHEXIDINE GLUCONATE 0.12 % MT SOLN
15.0000 mL | Freq: Two times a day (BID) | OROMUCOSAL | Status: DC
  Administered 2013-04-04 – 2013-04-09 (×11): 15 mL via OROMUCOSAL
  Filled 2013-04-04 (×15): qty 15

## 2013-04-04 MED ORDER — METHYLPREDNISOLONE SODIUM SUCC 40 MG IJ SOLR
40.0000 mg | Freq: Four times a day (QID) | INTRAMUSCULAR | Status: DC
  Administered 2013-04-04 – 2013-04-08 (×16): 40 mg via INTRAVENOUS
  Filled 2013-04-04 (×21): qty 1

## 2013-04-04 MED ORDER — SODIUM PHOSPHATE 3 MMOLE/ML IV SOLN
20.0000 mmol | Freq: Once | INTRAVENOUS | Status: AC
  Administered 2013-04-04: 20 mmol via INTRAVENOUS
  Filled 2013-04-04: qty 6.67

## 2013-04-04 MED FILL — Medication: Qty: 1 | Status: AC

## 2013-04-04 NOTE — Progress Notes (Signed)
Pt refusing bipap mask. Risks explained to pt via RT. MD aware. Pt on 4L [place].

## 2013-04-04 NOTE — Progress Notes (Signed)
CRITICAL VALUE ALERT  Critical value received:  Positive blood cultures  Date of notification:  04/04/2013  Time of notification:  1200  Critical value read back:yes  Nurse who received alert:  avery  MD notified (1st page):  feinstein  Time of first page:  1200  MD notified (2nd page):  Time of second page:  Responding MD:  feinstein  Time MD responded:  1200

## 2013-04-04 NOTE — Care Management Note (Signed)
    Page 1 of 1   04/04/2013     8:58:22 AM   CARE MANAGEMENT NOTE 04/04/2013  Patient:  [name], [name]   Account Number:  [id number]  Date Initiated:  04/04/2013  Documentation initiated by:  Avie Arenas  Subjective/Objective Assessment:   resp failure on bipap     Action/Plan:   Anticipated DC Date:  04/09/2013   Anticipated DC Plan:  SKILLED NURSING FACILITY  In-house referral  Clinical Social Worker      DC Planning Services  CM consult      Choice offered to / List presented to:             Status of service:  In process, will continue to follow Medicare Important Message given?   (If response is "NO", the following Medicare IM given date fields will be blank) Date Medicare IM given:   Date Additional Medicare IM given:    Discharge Disposition:    Per UR Regulation:  Reviewed for med. necessity/level of care/duration of stay  If discussed at Long Length of Stay Meetings, dates discussed:    Comments:  ContactArelia Longest Daughter [phone]  04-04-13 9am Avie Arenas, RNBSN [phone] Found unresponsive at SNF - SW referral placed for return.

## 2013-04-04 NOTE — Progress Notes (Signed)
PULMONARY  / CRITICAL CARE MEDICINE  Name: [name] MRN: [id number] DOB: [birth year]    ADMISSION DATE:  04/03/2013  REFERRING MD :  Dr. Freida Busman (EDP) Reason for admission:  AMS  BRIEF PATIENT DESCRIPTION: 62 year old female with history of COPD, obesity hypoventilation and multiple previous episodes of respiratory failure who was found unresponsive by SNF staff and EMS was called.  Some improvement on BiPAP in the ED. Hx of poor compliance with NIPPV at SNF.  SIGNIFICANT EVENTS / STUDIES:  10/1 - admitted for respiratory failure at SNF 10/2- NIMV still required  LINES / TUBES: BiPAP PIV  CULTURES: Blood 10/1>>> Sputum 10/1>>> Urine 10/1>>>  ANTIBIOTICS: Vancomycin 10/1>>> Zosyn 10/1>>>  SUBJECTIVE: Pt very upset with night nurse but in NAD. Some abdominal pain and complaints of "burning up."  VITAL SIGNS: Temp:  [97.4 F (36.3 C)-102.6 F (39.2 C)] 101 F (38.3 C) (10/02 0600) Pulse Rate:  [39-131] 40 (10/02 0600) Resp:  [15-34] 26 (10/02 0600) BP: (102-190)/(45-154) 102/58 mmHg (10/02 0600) SpO2:  [4 %-100 %] 97 % (10/02 0600) FiO2 (%):  [35 %-40 %] 40 % (10/02 0325) Weight:  [282 lb 3 oz (128 kg)-297 lb 13.5 oz (135.1 kg)] 296 lb 8.3 oz (134.5 kg) (10/02 0500) HEMODYNAMICS:   VENTILATOR SETTINGS: Vent Mode:  [-] BIPAP FiO2 (%):  [35 %-40 %] 40 % Set Rate:  [15 bmp-30 bmp] 15 bmp PEEP:  [6 cmH20] 6 cmH20 INTAKE / OUTPUT: Intake/Output     10/01 0701 - 10/02 0700 10/02 0701 - 10/03 0700   I.V. (mL/kg) 1197.9 (8.9)    IV Piggyback 1100    Total Intake(mL/kg) 2297.9 (17.1)    Urine (mL/kg/hr) 1755    Total Output 1755     Net +542.9            PHYSICAL EXAMINATION: General:  Chronically ill appearing obese female, arousable, speaking in full sentences Neuro:  Arousable and protecting her airway, moves all ext, oriented to self and year, but not to place ("[place]") HEENT:  [place]/AT, PERRL, EOM-I and MMM. Cardiovascular:  RRR, Nl S1/S2, no  M/R/G. Lungs:  Diffuse wheezing but otherwise no focal findings Abdomen:  Obese, soft, NT, ND and +BS. Musculoskeletal:  1+ edema bilaterally and -tenderness. Skin:  Chronic changes to LE with flaking/scaling, but otherwise intact  LABS:  CBC Recent Labs     04/03/13  1655  04/03/13  2039  04/04/13  0355  WBC  17.1*  22.5*  28.2*  HGB  11.0*  10.8*  10.8*  HCT  35.1*  33.5*  33.3*  PLT  186  177  167   Coag's Recent Labs     04/03/13  2039  APTT  26  INR  0.93   BMET Recent Labs     04/03/13  1655  04/03/13  2039  04/04/13  0355  NA  137  142  142  K  3.3*  3.3*  4.0  CL  79*  84*  87*  CO2  >45*  >45*  >45*  BUN  37*  36*  34*  CREATININE  0.80  0.82  0.75  GLUCOSE  133*  141*  147*   Electrolytes Recent Labs     04/03/13  1655  04/03/13  2039  04/04/13  0355  CALCIUM  10.7*  9.8  10.2  MG   --   1.4*  1.5  PHOS   --   2.2*  2.0*   Sepsis Markers  No results found for this basename: LACTICACIDVEN, PROCALCITON, O2SATVEN,  in the last 72 hours ABG Recent Labs     04/03/13  1709  04/03/13  2337  PHART  7.347*  7.397  PCO2ART  114.7*  98.7*  PO2ART  104.0*  96.0   Liver Enzymes Recent Labs     04/03/13  1655  04/03/13  2039  AST  35  34  ALT  18  17  ALKPHOS  70  60  BILITOT  0.3  0.3  ALBUMIN  3.9  3.5   Cardiac Enzymes Recent Labs     04/03/13  1655  04/03/13  2040  04/04/13  0110  TROPONINI  <0.30  <0.30  <0.30  PROBNP   --   387.1*   --    Glucose Recent Labs     04/03/13  2209  04/04/13  0004  04/04/13  0357  GLUCAP  153*  174*  153*    Imaging Ct Head Wo Contrast  04/03/2013   *RADIOLOGY REPORT*  Clinical Data: Altered mental status  CT HEAD WITHOUT CONTRAST  Technique:  Contiguous axial images were obtained from the base of the skull through the vertex without contrast.  Comparison: Prior CT from 11/05/2012  Findings: There is no acute intracranial hemorrhage or infarct. Mild chronic microvascular ischemic disease is  unchanged.  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  No extra-axial fluid collection.  No midline shift or mass lesion.  Calvarium is intact.  Paranasal sinuses and mastoid air cells are clear.  IMPRESSION: 1.  No acute intracranial process. 2.  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 3.  Mild chronic microvascular ischemic disease.   Original Report Authenticated By: Rise Mu, M.D.   Dg Chest Port 1 View  04/04/2013   CLINICAL DATA:  Intubated, shortness of Breath.  EXAM: PORTABLE CHEST - 1 VIEW  COMPARISON:  04/03/2013  FINDINGS: Very low lung volumes. Cardiomegaly with vascular congestion and bibasilar atelectasis. No significant change since prior study. No visible effusions.  IMPRESSION: No interval change.   Electronically Signed   By: Charlett Nose M.D.   On: 04/04/2013 06:16   Dg Chest Port 1 View  04/03/2013   CLINICAL DATA:  Shortness of Breath  EXAM: PORTABLE CHEST - 1 VIEW  COMPARISON:  January 21, 2013  FINDINGS: There is right base consolidation. Lungs are otherwise clear. Degree of inspiration shallow. Heart is upper normal in size with normal pulmonary vascularity. No adenopathy.  IMPRESSION: Right base consolidation.   Electronically Signed   By: Bretta Bang   On: 04/03/2013 17:03   CXR: RLL atx, low lung volume.  ASSESSMENT / PLAN:  PULMONARY A: COPD exacerbation with RLL infiltrate / atx, chronic resp acidosis abg, high wob issues P:   - Continue BiPAP, intubate if mental status deteriorates. -schedule NIMV 4 hrs on , 30 min off - Steroids maintain - Continue Vanc/zosyn (patient comes from a SNF).  CARDIOVASCULAR A: Fluctuant BP with history of HTN, range 100's/50's-140's/70's P:  - Hold anti-HTN. - No IVF. - Troponins as ordered (negative so far)-don not repeat - 2D echo for pa htn - BNP at ?baseline (300's) - No lasix for now but consider pending further resp status - EKG reviewed, sinus tach but no frank  ischemia  RENAL A:  Very high bicarb  Hypokalemia resolved Hypomagnesemia hypophos P:   - KVO IVF. - consider repeat BMET later today -replace mag,phos  GASTROINTESTINAL A:  No active  issues. P:   - NPO due to high risk of respiratory failure. - Protonix  HEMATOLOGIC A:  Leukocytosis with likely PNA. DVT prophylaxis P:  - CBC in AM. - subQ hep  INFECTIOUS A:  PNA, likely HCAP from SNF with leukocytosis trending up, r/o UTI P:   - Vanc/zosyn. - follow cultures. -pcxr in am   ENDOCRINE A:  DM.   P:   - ISS and CBG. -maintain roids  NEUROLOGIC A:  AMS, waxing/waning, tremor, resolved neuro today P:   - BiPAP. - CT of the head reviewed, nothing acute  TODAY'S SUMMARY: Improving respiratory failure likely due to COPD exacerbation and PNA. Continue BiPAP and follow fevers/cultures.  Bobbye Morton, MD PGY-2, [facility] 04/04/2013, 7:43 AM FPTS Service pager: [phone] (text pages welcome through [facility])  Ccm time 30 min  I have fully examined this patient and agree with above findings.    And edited in full  [name]. [name] Alias, MD, FACP Pgr: [phone] [place] [facility]

## 2013-04-04 NOTE — Progress Notes (Signed)
RT placed pt back on BIPAP with her previous settings. After about 35 minutes, pt removed the BIPAP mask and says she refuses to put back on. RT explained the possible consequences if mask not worn and her MD wanted her to remain on the mask. Pt still refused. MD aware. RT placed pt on 4 liter nasal cannula. Vital signs stable at this time. RT will continue monitor.

## 2013-04-04 NOTE — Progress Notes (Signed)
Pt. Took off her bipap off at the start of shift, stating that she did not want to wear it at this time. RT informed pt. That she could have a break from it for now, but will eventually have to go back on for tonight. Pt. Understood. RT to monitor.

## 2013-04-04 NOTE — Progress Notes (Signed)
*  PRELIMINARY RESULTS* Echocardiogram 2D Echocardiogram has been performed.  Jeryl Columbia 04/04/2013, 3:22 PM

## 2013-04-04 NOTE — Progress Notes (Signed)
eLink Physician-Brief Progress Note Patient Name: [name] DOB: 12-20-1950 MRN: [id number]  Date of Service  04/04/2013   HPI/Events of Note  Pt will not keep bipap in place  eICU Interventions  Place on VM, chk ABG, if worse>>intubate   Intervention Category Major Interventions: Respiratory failure - evaluation and management  Shan Levans 04/04/2013, 1:31 AM

## 2013-04-05 ENCOUNTER — Inpatient Hospital Stay (HOSPITAL_COMMUNITY): Payer: Medicaid Other

## 2013-04-05 LAB — CBC WITH DIFFERENTIAL/PLATELET
Basophils Relative: 0 % (ref 0–1)
Eosinophils Absolute: 0 10*3/uL (ref 0.0–0.7)
Eosinophils Relative: 0 % (ref 0–5)
Lymphocytes Relative: 4 % — ABNORMAL LOW (ref 12–46)
MCH: 29.8 pg (ref 26.0–34.0)
MCHC: 32.9 g/dL (ref 30.0–36.0)
MCV: 90.5 fL (ref 78.0–100.0)
Monocytes Relative: 3 % (ref 3–12)
Neutrophils Relative %: 94 % — ABNORMAL HIGH (ref 43–77)
Platelets: 189 10*3/uL (ref 150–400)

## 2013-04-05 LAB — POCT I-STAT 3, ART BLOOD GAS (G3+)
Acid-Base Excess: 27 mmol/L — ABNORMAL HIGH (ref 0.0–2.0)
O2 Saturation: 98 %
Patient temperature: 99.9
pCO2 arterial: 70.2 mmHg (ref 35.0–45.0)
pH, Arterial: 7.5 — ABNORMAL HIGH (ref 7.350–7.450)
pO2, Arterial: 114 mmHg — ABNORMAL HIGH (ref 80.0–100.0)

## 2013-04-05 LAB — URINE CULTURE
Colony Count: NO GROWTH
Culture: NO GROWTH

## 2013-04-05 LAB — GLUCOSE, CAPILLARY
Glucose-Capillary: 115 mg/dL — ABNORMAL HIGH (ref 70–99)
Glucose-Capillary: 128 mg/dL — ABNORMAL HIGH (ref 70–99)
Glucose-Capillary: 136 mg/dL — ABNORMAL HIGH (ref 70–99)
Glucose-Capillary: 137 mg/dL — ABNORMAL HIGH (ref 70–99)
Glucose-Capillary: 149 mg/dL — ABNORMAL HIGH (ref 70–99)
Glucose-Capillary: 172 mg/dL — ABNORMAL HIGH (ref 70–99)

## 2013-04-05 LAB — BASIC METABOLIC PANEL
CO2: >45 mEq/L (ref 19–32)
Calcium: 10.3 mg/dL (ref 8.4–10.5)
Chloride: 85 mEq/L — ABNORMAL LOW (ref 96–112)
GFR calc non Af Amer: 89 mL/min — ABNORMAL LOW (ref >90)
Sodium: 137 mEq/L (ref 135–145)

## 2013-04-05 LAB — MAGNESIUM: Magnesium: 1.9 mg/dL (ref 1.5–2.5)

## 2013-04-05 LAB — PHOSPHORUS: Phosphorus: 3.2 mg/dL (ref 2.3–4.6)

## 2013-04-05 MED ORDER — IPRATROPIUM BROMIDE 0.02 % IN SOLN: 0.5000 mg | Freq: Four times a day (QID) | RESPIRATORY_TRACT | Status: DC | PRN

## 2013-04-05 MED ORDER — POTASSIUM CHLORIDE CRYS ER 20 MEQ PO TBCR
20.0000 meq | EXTENDED_RELEASE_TABLET | ORAL | Status: AC
  Administered 2013-04-05 (×2): 20 meq via ORAL
  Filled 2013-04-05 (×2): qty 1

## 2013-04-05 MED ORDER — AMLODIPINE BESYLATE 5 MG PO TABS
5.0000 mg | ORAL_TABLET | Freq: Every day | ORAL | Status: DC
  Administered 2013-04-05 – 2013-04-09 (×5): 5 mg via ORAL
  Filled 2013-04-05 (×6): qty 1

## 2013-04-05 MED ORDER — ALBUTEROL SULFATE (5 MG/ML) 0.5% IN NEBU: 2.5000 mg | INHALATION_SOLUTION | RESPIRATORY_TRACT | Status: DC

## 2013-04-05 MED ORDER — ALBUTEROL SULFATE (5 MG/ML) 0.5% IN NEBU: 2.5000 mg | INHALATION_SOLUTION | Freq: Four times a day (QID) | RESPIRATORY_TRACT | Status: DC | PRN

## 2013-04-05 MED ORDER — POTASSIUM CHLORIDE 10 MEQ/100ML IV SOLN: 10.0000 meq | INTRAVENOUS | Status: DC

## 2013-04-05 NOTE — Progress Notes (Signed)
Pt. Continues to take her bipap mask off. Pt. Has been non-compliant with the bipap. Pt. Placed on nasal cannula by RN. Pt. States she doesn't want to wear the bipap and that she is having trouble sleeping.

## 2013-04-05 NOTE — Consult Note (Signed)
WOC consult Note Reason for Consult: Consult requested for left outer leg wound.  Pt states it has improved and she used to be followed by the outpatient wound care center. Wound type: Healing full thickness wound Measurement:6X3X.1cm Wound bed:100% pink, moist wound bed Drainage (amount, consistency, odor) no odor, small amt yellow drainage  Periwound:Dry peeling scaly skin surrounding wound. Dressing procedure/placement/frequency: Foam dressing to protect and promote healing. Please re-consult if further assistance is needed.  Thank-you,  Cammie Mcgee MSN, RN, CWOCN, [place], CNS [phone]

## 2013-04-05 NOTE — Progress Notes (Signed)
eLink Nursing ICU Electrolyte Replacement Protocol  Patient Name: [name] DOB: 1950/11/20 MRN: [id number]  Date of Service  04/05/2013   HPI/Events of Note    Recent Labs Lab 04/03/13 1655 04/03/13 2039 04/04/13 0355 04/05/13 0418  NA 137 142 142 137  K 3.3* 3.3* 4.0 3.3*  CL 79* 84* 87* 85*  CO2 >45* >45* >45* >45*  GLUCOSE 133* 141* 147* 135*  BUN 37* 36* 34* 25*  CREATININE 0.80 0.82 0.75 0.73  CALCIUM 10.7* 9.8 10.2 10.3  MG  --  1.4* 1.5 1.9  PHOS  --  2.2* 2.0* 3.2    Estimated Creatinine Clearance: 97 ml/min (by C-G formula based on Cr of 0.73).  Intake/Output     10/02 0701 - 10/03 0700   P.O. 440   I.V. (mL/kg) 288.6 (2.1)   IV Piggyback 456.7   Total Intake(mL/kg) 1185.3 (8.8)   Urine (mL/kg/hr) 3525 (1.1)   Total Output 3525   Net -2339.8       Stool Occurrence 1 x    - I/O DETAILED x24h    Total I/O In: 530 [P.O.:440; I.V.:90] Out: 1000 [Urine:1000] - I/O THIS SHIFT    ASSESSMENT   eICURN Interventions  K+ 3.3  Electrolyte protocol criteria met. Lab values replaced per protocol. MD notified.    ASSESSMENT: MAJOR ELECTROLYTE    [name] 04/05/2013, 5:38 AM

## 2013-04-05 NOTE — Progress Notes (Signed)
Pt transferred to unit 5West from 83M. Pt is alert and oriented to staff, call bell, and room. Bed in lowest position. Call bell within reach. Full assessment to Epic. Will continue to monitor. Fayne Norrie, RN

## 2013-04-05 NOTE — Progress Notes (Signed)
PULMONARY  / CRITICAL CARE MEDICINE  Name: [name] MRN: [id number] DOB: 12-Jul-1950    ADMISSION DATE:  04/03/2013  REFERRING MD :  Dr. Freida Busman (EDP) Reason for admission:  AMS  BRIEF PATIENT DESCRIPTION: 62 year old female with history of COPD, obesity hypoventilation and multiple previous episodes of respiratory failure who was found unresponsive by SNF staff and EMS was called.  Some improvement on BiPAP in the ED. Hx of poor compliance with NIPPV at SNF.  SIGNIFICANT EVENTS / STUDIES:  10/1 - admitted for respiratory failure at SNF 10/2 - NIMV still required 10/3 - Resp status improving, intermittently refusing/pulling off BiPAP  LINES / TUBES: BiPAP PIV  CULTURES: Blood 10/1>>> GRAM POSITIVE cocci in pairs and chains (aerobic only)>>> Sputum 10/1>>> Urine 10/1>>> NO GROWTH  ANTIBIOTICS: Vancomycin 10/1>>> Imipenem 10/1 >>> Levoquin 10/1 >>>10/3 (Zosyn NOT given 10/1 previously documented)  SUBJECTIVE: Pt pulled off chest leads this morning. Better oriented, denies pain. States her breathing feels better today.  VITAL SIGNS: Temp:  [99.7 F (37.6 C)-100.7 F (38.2 C)] 99.9 F (37.7 C) (10/03 0700) Pulse Rate:  [36-116] 103 (10/03 0700) Resp:  [15-36] 25 (10/03 0700) BP: (113-161)/(48-106) 127/48 mmHg (10/03 0700) SpO2:  [89 %-100 %] 100 % (10/03 0700) FiO2 (%):  [40 %] 40 % (10/02 2340) Weight:  [297 lb 9.9 oz (135 kg)] 297 lb 9.9 oz (135 kg) (10/03 0442) HEMODYNAMICS:   VENTILATOR SETTINGS: Vent Mode:  [-] BIPAP FiO2 (%):  [40 %] 40 % Set Rate:  [15 bmp] 15 bmp PEEP:  [6 cmH20] 6 cmH20 INTAKE / OUTPUT: Intake/Output     10/02 0701 - 10/03 0700 10/03 0701 - 10/04 0700   P.O. 440    I.V. (mL/kg) 318.6 (2.4)    IV Piggyback 456.7    Total Intake(mL/kg) 1215.3 (9)    Urine (mL/kg/hr) 3815 (1.2)    Total Output 3815     Net -2599.8          Stool Occurrence 1 x      PHYSICAL EXAMINATION: General:  Chronically ill appearing obese female,  arousable, speaking in full sentences, on [place] 5L Neuro:  Arousable and protecting her airway, moves all ext, oriented x4 this morning HEENT:  [place]/AT, PERRL, EOM-I and MMM. Cardiovascular:  RRR, Nl S1/S2, no M/R/G. Lungs:  Diffuse wheezing, improving, but no focal findings Abdomen:  Obese, soft, NT, ND and +BS. Musculoskeletal:  1+ edema bilaterally and -tenderness. Skin:  Chronic changes to LE with flaking/scaling, but otherwise intact  LABS:  CBC Recent Labs     04/03/13  2039  04/04/13  0355  04/04/13  1154  WBC  22.5*  28.2*  25.7*  HGB  10.8*  10.8*  10.3*  HCT  33.5*  33.3*  32.4*  PLT  177  167  178   Coag's Recent Labs     04/03/13  2039  APTT  26  INR  0.93   BMET Recent Labs     04/03/13  2039  04/04/13  0355  04/05/13  0418  NA  142  142  137  K  3.3*  4.0  3.3*  CL  84*  87*  85*  CO2  >45*  >45*  >45*  BUN  36*  34*  25*  CREATININE  0.82  0.75  0.73  GLUCOSE  141*  147*  135*   Electrolytes Recent Labs     04/03/13  2039  04/04/13  0355  04/05/13  0418  CALCIUM  9.8  10.2  10.3  MG  1.4*  1.5  1.9  PHOS  2.2*  2.0*  3.2   Sepsis Markers No results found for this basename: LACTICACIDVEN, PROCALCITON, O2SATVEN,  in the last 72 hours ABG Recent Labs     04/03/13  2337  04/04/13  0206  04/05/13  0705  PHART  7.397  7.465*  7.500*  PCO2ART  98.7*  83.1*  70.2*  PO2ART  96.0  84.0  114.0*   Liver Enzymes Recent Labs     04/03/13  1655  04/03/13  2039  AST  35  34  ALT  18  17  ALKPHOS  70  60  BILITOT  0.3  0.3  ALBUMIN  3.9  3.5   Cardiac Enzymes Recent Labs     04/03/13  2040  04/04/13  0110  04/04/13  0716  TROPONINI  <0.30  <0.30  <0.30  PROBNP  387.1*   --    --    Glucose Recent Labs     04/04/13  0757  04/04/13  1137  04/04/13  1513  04/04/13  2008  04/05/13  0009  04/05/13  0410  GLUCAP  152*  150*  136*  157*  115*  137*    Imaging Ct Head Wo Contrast  04/03/2013   *RADIOLOGY REPORT*  Clinical Data:  Altered mental status  CT HEAD WITHOUT CONTRAST  Technique:  Contiguous axial images were obtained from the base of the skull through the vertex without contrast.  Comparison: Prior CT from 11/05/2012  Findings: There is no acute intracranial hemorrhage or infarct. Mild chronic microvascular ischemic disease is unchanged.  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  No extra-axial fluid collection.  No midline shift or mass lesion.  Calvarium is intact.  Paranasal sinuses and mastoid air cells are clear.  IMPRESSION: 1.  No acute intracranial process. 2.  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 3.  Mild chronic microvascular ischemic disease.   Original Report Authenticated By: Rise Mu, M.D.   Dg Chest Port 1 View  04/04/2013   CLINICAL DATA:  Intubated, shortness of Breath.  EXAM: PORTABLE CHEST - 1 VIEW  COMPARISON:  04/03/2013  FINDINGS: Very low lung volumes. Cardiomegaly with vascular congestion and bibasilar atelectasis. No significant change since prior study. No visible effusions.  IMPRESSION: No interval change.   Electronically Signed   By: Charlett Nose M.D.   On: 04/04/2013 06:16   Dg Chest Port 1 View  04/03/2013   CLINICAL DATA:  Shortness of Breath  EXAM: PORTABLE CHEST - 1 VIEW  COMPARISON:  January 21, 2013  FINDINGS: There is right base consolidation. Lungs are otherwise clear. Degree of inspiration shallow. Heart is upper normal in size with normal pulmonary vascularity. No adenopathy.  IMPRESSION: Right base consolidation.   Electronically Signed   By: Bretta Bang   On: 04/03/2013 17:03   pCXR: low lung volumes, int prominencne  ASSESSMENT / PLAN:  PULMONARY A: COPD exacerbation, chronic resp acidosis abg, high wob issues ABG improved - now mild alkalosis (?forced compliance with BiPAP) CXR without definite infiltrate P:  No BIPAp and refused -schedule NIMV 4 hrs on , 30 min off - Steroids - maintain Solu-Medrol 40 q6 - Continue  Vanc/Imi (patient comes from a SNF, now with GRAM POS cocci in aerobic culture) - see ID -neg on own, improved with this  CARDIOVASCULAR A: Fluctuant BP with history of HTN,  range 100's/50's-140's/70's BNP at ?baseline (300's) 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 P:   - Carb modified - Protonix  HEMATOLOGIC A:  Leukocytosis with likely PNA DVT prophylaxis P:  - trend CBC - subQ hep  INFECTIOUS A: Leukocytosis - probable PNA, likely HCAP from SNF, calf ulcer possible Urine cx negative P:   - Vanc/Imipenem - narrow once ID of organism -echo no veg follow ID of strep? Vs enteroccus - follow cultures. -repeat pcxr in am  Dc levo Wound care  May need mri to r/o osteo  ENDOCRINE A:  DM.   P:   - ISS and CBG. -maintain steroids for resp status  NEUROLOGIC A:  AMS / tremor - resolved, likely secondary to resp failure P:   - CT of the head reviewed, nothing acute  TODAY'S SUMMARY: Improving respiratory failure likely due to COPD exacerbation and PNA. , vanc, imipenem,  and follow fevers/cultures.  Bobbye Morton, MD PGY-2, [facility] 04/05/2013, 7:38 AM  To floor, triad  I have fully examined this patient and agree with above findings.    And edited in full  [name]. [name] Alias, MD, FACP Pgr: [phone] [place] [facility]

## 2013-04-06 ENCOUNTER — Inpatient Hospital Stay (HOSPITAL_COMMUNITY): Payer: Medicaid Other

## 2013-04-06 DIAGNOSIS — R7881 Bacteremia: Secondary | ICD-10-CM

## 2013-04-06 DIAGNOSIS — E119 Type 2 diabetes mellitus without complications: Secondary | ICD-10-CM

## 2013-04-06 LAB — BASIC METABOLIC PANEL
Calcium: 10.1 mg/dL (ref 8.4–10.5)
GFR calc Af Amer: >90 mL/min (ref >90)
GFR calc non Af Amer: 89 mL/min — ABNORMAL LOW (ref >90)
Glucose, Bld: 149 mg/dL — ABNORMAL HIGH (ref 70–99)
Potassium: 3.9 mEq/L (ref 3.5–5.1)
Sodium: 137 mEq/L (ref 135–145)

## 2013-04-06 LAB — GLUCOSE, CAPILLARY
Glucose-Capillary: 149 mg/dL — ABNORMAL HIGH (ref 70–99)
Glucose-Capillary: 160 mg/dL — ABNORMAL HIGH (ref 70–99)
Glucose-Capillary: 183 mg/dL — ABNORMAL HIGH (ref 70–99)

## 2013-04-06 MED ORDER — SIMETHICONE 80 MG PO CHEW
80.0000 mg | CHEWABLE_TABLET | Freq: Four times a day (QID) | ORAL | Status: DC
  Administered 2013-04-06 – 2013-04-09 (×11): 80 mg via ORAL
  Filled 2013-04-06 (×17): qty 1

## 2013-04-06 MED ORDER — SIMETHICONE 40 MG/0.6ML PO SUSP: 80.0000 mg | Freq: Four times a day (QID) | ORAL | Status: DC

## 2013-04-06 MED ORDER — IBUPROFEN 600 MG PO TABS
600.0000 mg | ORAL_TABLET | Freq: Once | ORAL | Status: AC
  Administered 2013-04-06: 16:00:00 600 mg via ORAL
  Filled 2013-04-06: qty 1

## 2013-04-06 NOTE — Progress Notes (Signed)
[facility] PROGRESS NOTE  [name] ZOX:[id number] DOB: 07-04-1951 DOA: 04/03/2013 PCP: Karlene Einstein, MD  Interval history 62 year old female with history of COPD, obesity hypoventilation and multiple previous episodes of respiratory failure who was found unresponsive by SNF staff and EMS was called. When EMS arrived the patient was altered but was able to protect her airway. Patient was placed on 100% NRB and brought to the ER where she was placed on BiPAP and showed signs of improvement. Patient was admitted to ICU. Was started on solumedrol and antibiotics, and when stable was transferred to floor.   Assessment/Plan: 1. COPD Exacerbation- Patient refused Bipap, continue with solumedrol 40 mg IV q 6 hrs, Continue Duoneb nebulizer q 6 hr prn 2. Bacteremia- Blood cultures drawn on 10/1, one out of four bottles growing GPC in pairs and chains, will continue with vancomycin, till final culture is resulted. 3. HCAP- patient  Will be continued on vancomycin and zosyn, wbc is improving. 4. Metabolic acidosis- chronically elevated Co2, will continue to monitor . 5. Diabetes Mellitus- Blood glucose is stable, continue SSI 6. DVT prophylaxis- Norvasc  Code Status: Full  Family Communication: *Discussed with patient in detail Disposition Plan: SNF   Consultants:  CCM  Procedures:  None  Antibiotics:  Vancomycin 10/1  Primaxin 10/1  HPI/Subjective: Patient seen and examined, no new complaints  Objective: Filed Vitals:   04/06/13 1313  BP: 155/88  Pulse: 98  Temp: 98.4 F (36.9 C)  Resp: 24    Intake/Output Summary (Last 24 hours) at 04/06/13 1519 Last data filed at 04/06/13 0113  Gross per 24 hour  Intake    440 ml  Output      0 ml  Net    440 ml   Filed Weights   04/04/13 0500 04/05/13 0442 04/05/13 2020  Weight: 134.5 kg (296 lb 8.3 oz) 135 kg (297 lb 9.9 oz) 137 kg (302 lb 0.5 oz)    Exam:   General:  Appear in no acute  distress  Cardiovascular: S1s2 RRR  Respiratory: Clear bilaterally  Abdomen: Soft, nontender  Musculoskeletal: No edema of the lower extremities  Data Reviewed: Basic Metabolic Panel:  Recent Labs Lab 04/03/13 1655 04/03/13 2039 04/04/13 0355 04/05/13 0418 04/06/13 0621  NA 137 142 142 137 137  K 3.3* 3.3* 4.0 3.3* 3.9  CL 79* 84* 87* 85* 87*  CO2 >45* >45* >45* >45* 40*  GLUCOSE 133* 141* 147* 135* 149*  BUN 37* 36* 34* 25* 30*  CREATININE 0.80 0.82 0.75 0.73 0.72  CALCIUM 10.7* 9.8 10.2 10.3 10.1  MG  --  1.4* 1.5 1.9  --   PHOS  --  2.2* 2.0* 3.2  --    Liver Function Tests:  Recent Labs Lab 04/03/13 1655 04/03/13 2039  AST 35 34  ALT 18 17  ALKPHOS 70 60  BILITOT 0.3 0.3  PROT 8.2 7.7  ALBUMIN 3.9 3.5   No results found for this basename: LIPASE, AMYLASE,  in the last 168 hours No results found for this basename: AMMONIA,  in the last 168 hours CBC:  Recent Labs Lab 04/03/13 1655 04/03/13 2039 04/04/13 0355 04/04/13 1154 04/05/13 0730  WBC 17.1* 22.5* 28.2* 25.7* 22.9*  NEUTROABS 15.8* 21.1*  --  24.2* 21.4*  HGB 11.0* 10.8* 10.8* 10.3* 10.7*  HCT 35.1* 33.5* 33.3* 32.4* 32.5*  MCV 92.1 92.3 91.2 91.3 90.5  PLT 186 177 167 178 189   Cardiac Enzymes:  Recent Labs Lab 04/03/13 1655 04/03/13  2040 04/04/13 0110 04/04/13 0716  TROPONINI <0.30 <0.30 <0.30 <0.30   BNP (last 3 results)  Recent Labs  11/05/12 1800 01/19/13 0231 04/03/13 2040  PROBNP 332.1* 1633.0* 387.1*   CBG:  Recent Labs Lab 04/05/13 2022 04/06/13 0010 04/06/13 0416 04/06/13 0748 04/06/13 1224  GLUCAP 172* 160* 149* 169* 161*    Recent Results (from the past 240 hour(s))  CULTURE, BLOOD (ROUTINE X 2)     Status: None   Collection Time    04/03/13  4:40 PM      Result Value Range Status   Specimen Description BLOOD HAND LEFT   Final   Special Requests BOTTLES DRAWN AEROBIC ONLY 2CC   Final   Culture  Setup Time     Final   Value: 04/04/2013 00:16      Performed at [facility]   Culture     Final   Value: GRAM POSITIVE COCCI IN PAIRS AND CHAINS     Note: Gram Stain Report Called to,Read Back By and Verified With: Misty Stanley BECK @ 1029 04/04/13 BY KRAWS     Performed at [facility]   Report Status PENDING   Incomplete  CULTURE, BLOOD (ROUTINE X 2)     Status: None   Collection Time    04/03/13  4:53 PM      Result Value Range Status   Specimen Description BLOOD RIGHT FOREARM   Final   Special Requests BOTTLES DRAWN AEROBIC ONLY 2CC   Final   Culture  Setup Time     Final   Value: 04/04/2013 00:16     Performed at [facility]   Culture     Final   Value:        BLOOD CULTURE RECEIVED NO GROWTH TO DATE CULTURE WILL BE HELD FOR 5 DAYS BEFORE ISSUING A FINAL NEGATIVE REPORT     Performed at [facility]   Report Status PENDING   Incomplete  CULTURE, BLOOD (ROUTINE X 2)     Status: None   Collection Time    04/03/13  8:37 PM      Result Value Range Status   Specimen Description BLOOD ARM RIGHT   Final   Special Requests BOTTLES DRAWN AEROBIC ONLY 1CC   Final   Culture  Setup Time     Final   Value: 04/04/2013 00:43     Performed at [facility]   Culture     Final   Value:        BLOOD CULTURE RECEIVED NO GROWTH TO DATE CULTURE WILL BE HELD FOR 5 DAYS BEFORE ISSUING A FINAL NEGATIVE REPORT     Performed at [facility]   Report Status PENDING   Incomplete  CULTURE, BLOOD (ROUTINE X 2)     Status: None   Collection Time    04/03/13  8:45 PM      Result Value Range Status   Specimen Description BLOOD HAND RIGHT   Final   Special Requests BOTTLES DRAWN AEROBIC ONLY 1CC   Final   Culture  Setup Time     Final   Value: 04/04/2013 00:42     Performed at [facility]   Culture     Final   Value:        BLOOD CULTURE RECEIVED NO GROWTH TO DATE CULTURE WILL BE HELD FOR 5 DAYS BEFORE ISSUING A FINAL NEGATIVE REPORT     Performed at [facility]  Report Status PENDING    Incomplete  MRSA PCR SCREENING     Status: None   Collection Time    04/03/13 10:24 PM      Result Value Range Status   MRSA by PCR NEGATIVE  NEGATIVE Final   Comment:            The GeneXpert MRSA Assay (FDA     approved for NASAL specimens     only), is one component of a     comprehensive MRSA colonization     surveillance program. It is not     intended to diagnose MRSA     infection nor to guide or     monitor treatment for     MRSA infections.  URINE CULTURE     Status: None   Collection Time    04/03/13 10:41 PM      Result Value Range Status   Specimen Description URINE, CATHETERIZED   Final   Special Requests NONE   Final   Culture  Setup Time     Final   Value: 04/04/2013 00:55     Performed at [facility]   Colony Count     Final   Value: NO GROWTH     Performed at [facility]   Culture     Final   Value: NO GROWTH     Performed at [facility]   Report Status 04/05/2013 FINAL   Final     Studies: Dg Chest Port 1 View  04/06/2013   CLINICAL DATA:  Right basilar atelectasis versus infiltrate  EXAM: PORTABLE CHEST - 1 VIEW  COMPARISON:  04/05/2013  FINDINGS: The heart size appears normal. Lung volumes are low and there is asymmetric elevation of the right hemidiaphragm.  IMPRESSION: 1. Stable low lung volumes with asymmetric elevation of the right hemidiaphragm.   Electronically Signed   By: Signa Kell M.D.   On: 04/06/2013 07:51   Dg Chest Port 1 View  04/05/2013   CLINICAL DATA:  Follow-up respiratory failure  EXAM: PORTABLE CHEST - 1 VIEW  COMPARISON:  04/04/2013  FINDINGS: Low lung volumes with vascular crowding. No frank interstitial edema. No pleural effusion or pneumothorax.  Cardiomegaly.  IMPRESSION: Low lung volumes.   Electronically Signed   By: Charline Bills M.D.   On: 04/05/2013 07:38    Scheduled Meds: . amLODipine  5 mg Oral Daily  . antiseptic oral rinse  15 mL Mouth Rinse q12n4p  . chlorhexidine  15 mL Mouth  Rinse BID  . heparin  5,000 Units Subcutaneous Q8H  . imipenem-cilastatin  500 mg Intravenous Q6H  . insulin aspart  2-6 Units Subcutaneous Q4H  . methylPREDNISolone (SOLU-MEDROL) injection  40 mg Intravenous Q6H  . pantoprazole (PROTONIX) IV  40 mg Intravenous Q24H  . simethicone  80 mg Oral QID  . vancomycin  1,000 mg Intravenous Q12H   Continuous Infusions: . sodium chloride 10 mL/hr at 04/05/13 1658    Principal Problem:   Acute-on-chronic respiratory failure Active Problems:   OBSTRUCTIVE SLEEP APNEA   C O P D   Chronic respiratory failure    Time spent: 25 min    [facility]  [facility] Pager [phone]. If 7PM-7AM, please contact night-coverage at [email or web address], password [facility] 04/06/2013, 3:19 PM  LOS: 3 days

## 2013-04-06 NOTE — Progress Notes (Signed)
Clinical Social Work Department BRIEF PSYCHOSOCIAL ASSESSMENT 04/06/2013  Patient:  [name], [name]     Account Number:  [id number]     Admit date:  04/03/2013  Clinical Social Worker:  Jacelyn Grip  Date/Time:  04/06/2013 02:02 PM  Referred by:  Physician  Date Referred:  04/06/2013 Referred for  SNF Placement   Other Referral:   Interview type:  Patient Other interview type:    PSYCHOSOCIAL DATA Living Status:  FACILITY Admitted from facility:  [facility] Level of care:  Skilled Nursing Facility Primary support name:  [name]/daughter Primary support relationship to patient:  CHILD, ADULT Degree of support available:   adequate    CURRENT CONCERNS Current Concerns  Post-Acute Placement   Other Concerns:    SOCIAL WORK ASSESSMENT / PLAN CSW received referral that pt admitted from [facility]    CSW met with pt at bedside. Pt confirmed that she is a resident at [facility] and plans to return at discharge. CSW explained that CSW will be able to assist with discharge back to [facility] once pt medically ready for discharge. Pt expressed appreciation.    CSW completed FL2 and sent clinicals to [facility].    CSW to continue to follow and facilitate pt discharge needs back to [facility] when pt medically ready for discharge.   Assessment/plan status:  Psychosocial Support/Ongoing Assessment of Needs Other assessment/ plan:   discharge planning   Information/referral to community resources:   Referral back to [facility]    PATIENT'S/FAMILY'S RESPONSE TO PLAN OF CARE: Pt alert and oriented x 4. Pt reports that she has been a resident at [facility] for a significant amount of time and has been satisfied with pt care at [facility].    [name], MSW, [employer]  Clinical Social Work Weekend coverage [phone]

## 2013-04-06 NOTE — Progress Notes (Signed)
ANTIBIOTIC CONSULT NOTE - Follow-Up  Pharmacy Consult for vancomycin + primaxin Indication: rule out pneumonia  Allergies  Allergen Reactions  . Aspirin Other (See Comments)    Per MAR  . Bee Venom Other (See Comments)    Unknown  . Lisinopril     Per MAR  . Nyquil Cold & [Dm-Doxylamine-Acetaminophen] Other (See Comments)    Unknown   . Penicillins Other (See Comments)    unknown  . Ppd [Tuberculin Purified Protein Derivative] Other (See Comments)    unknown    Patient Measurements: Height: 5\' 3"  (160 cm) Weight: 302 lb 0.5 oz (137 kg) IBW/kg (Calculated) : 52.4  Vital Signs: Temp: 98.8 F (37.1 C) (10/04 0449) Temp src: Oral (10/04 0449) BP: 164/93 mmHg (10/04 0449) Pulse Rate: 106 (10/04 0449) Intake/Output from previous day: 10/03 0701 - 10/04 0700 In: 940 [P.O.:540; I.V.:100; IV Piggyback:300] Out: 955 [Urine:955] Intake/Output from this shift:    Labs:  Recent Labs  04/04/13 0355 04/04/13 1154 04/05/13 0418 04/05/13 0730 04/06/13 0621  WBC 28.2* 25.7*  --  22.9*  --   HGB 10.8* 10.3*  --  10.7*  --   PLT 167 178  --  189  --   CREATININE 0.75  --  0.73  --  0.72   Estimated Creatinine Clearance: 97.9 ml/min (by C-G formula based on Cr of 0.72). No results found for this basename: VANCOTROUGH, VANCOPEAK, VANCORANDOM, GENTTROUGH, GENTPEAK, GENTRANDOM, TOBRATROUGH, TOBRAPEAK, TOBRARND, AMIKACINPEAK, AMIKACINTROU, AMIKACIN,  in the last 72 hours   Microbiology: Recent Results (from the past 720 hour(s))  CULTURE, BLOOD (ROUTINE X 2)     Status: None   Collection Time    04/03/13  4:40 PM      Result Value Range Status   Specimen Description BLOOD HAND LEFT   Final   Special Requests BOTTLES DRAWN AEROBIC ONLY 2CC   Final   Culture  Setup Time     Final   Value: 04/04/2013 00:16     Performed at [facility]   Culture     Final   Value: GRAM POSITIVE COCCI IN PAIRS AND CHAINS     Note: Gram Stain Report Called to,Read Back By and Verified  With: Misty Stanley BECK @ 1029 04/04/13 BY KRAWS     Performed at [facility]   Report Status PENDING   Incomplete  CULTURE, BLOOD (ROUTINE X 2)     Status: None   Collection Time    04/03/13  4:53 PM      Result Value Range Status   Specimen Description BLOOD RIGHT FOREARM   Final   Special Requests BOTTLES DRAWN AEROBIC ONLY 2CC   Final   Culture  Setup Time     Final   Value: 04/04/2013 00:16     Performed at [facility]   Culture     Final   Value:        BLOOD CULTURE RECEIVED NO GROWTH TO DATE CULTURE WILL BE HELD FOR 5 DAYS BEFORE ISSUING A FINAL NEGATIVE REPORT     Performed at [facility]   Report Status PENDING   Incomplete  CULTURE, BLOOD (ROUTINE X 2)     Status: None   Collection Time    04/03/13  8:37 PM      Result Value Range Status   Specimen Description BLOOD ARM RIGHT   Final   Special Requests BOTTLES DRAWN AEROBIC ONLY 1CC   Final   Culture  Setup  Time     Final   Value: 04/04/2013 00:43     Performed at [facility]   Culture     Final   Value:        BLOOD CULTURE RECEIVED NO GROWTH TO DATE CULTURE WILL BE HELD FOR 5 DAYS BEFORE ISSUING A FINAL NEGATIVE REPORT     Performed at [facility]   Report Status PENDING   Incomplete  CULTURE, BLOOD (ROUTINE X 2)     Status: None   Collection Time    04/03/13  8:45 PM      Result Value Range Status   Specimen Description BLOOD HAND RIGHT   Final   Special Requests BOTTLES DRAWN AEROBIC ONLY 1CC   Final   Culture  Setup Time     Final   Value: 04/04/2013 00:42     Performed at [facility]   Culture     Final   Value:        BLOOD CULTURE RECEIVED NO GROWTH TO DATE CULTURE WILL BE HELD FOR 5 DAYS BEFORE ISSUING A FINAL NEGATIVE REPORT     Performed at [facility]   Report Status PENDING   Incomplete  MRSA PCR SCREENING     Status: None   Collection Time    04/03/13 10:24 PM      Result Value Range Status   MRSA by PCR NEGATIVE  NEGATIVE Final    Comment:            The GeneXpert MRSA Assay (FDA     approved for NASAL specimens     only), is one component of a     comprehensive MRSA colonization     surveillance program. It is not     intended to diagnose MRSA     infection nor to guide or     monitor treatment for     MRSA infections.  URINE CULTURE     Status: None   Collection Time    04/03/13 10:41 PM      Result Value Range Status   Specimen Description URINE, CATHETERIZED   Final   Special Requests NONE   Final   Culture  Setup Time     Final   Value: 04/04/2013 00:55     Performed at [facility]   Colony Count     Final   Value: NO GROWTH     Performed at [facility]   Culture     Final   Value: NO GROWTH     Performed at [facility]   Report Status 04/05/2013 FINAL   Final    Medical History: Past Medical History  Diagnosis Date  . COPD   . Hypertension   . Chronic respiratory failure     3 L oxygen  . GERD (gastroesophageal reflux disease)   . Diabetes mellitus   . Obesity, Class III, BMI 40-49.9 (morbid obesity)   . Obstructive sleep apnea on CPAP   . Peripheral vascular disease   . Bipolar disease, chronic   . Lymphedema   . COPD (chronic obstructive pulmonary disease)   . Cellulitis   . Coronary artery disease   . Asthma   . Myocardial infarction   . Vertigo     Medications:  Anti-infectives   Start     Dose/Rate Route Frequency Ordered Stop   04/04/13 1800  levofloxacin (LEVAQUIN) IVPB 750 mg  Status:  Discontinued     750  mg 100 mL/hr over 90 Minutes Intravenous Every 24 hours 04/03/13 2059 04/05/13 1219   04/04/13 0900  vancomycin (VANCOCIN) IVPB 1000 mg/200 mL premix     1,000 mg 200 mL/hr over 60 Minutes Intravenous Every 12 hours 04/03/13 1908     04/03/13 2000  imipenem-cilastatin (PRIMAXIN) 500 mg in sodium chloride 0.9 % 100 mL IVPB     500 mg 200 mL/hr over 30 Minutes Intravenous Every 6 hours 04/03/13 1908     04/03/13 1915  vancomycin (VANCOCIN)  2,000 mg in sodium chloride 0.9 % 500 mL IVPB     2,000 mg 250 mL/hr over 120 Minutes Intravenous  Once 04/03/13 1908 04/04/13 0147   04/03/13 1730  levofloxacin (LEVAQUIN) IVPB 750 mg     750 mg 100 mL/hr over 90 Minutes Intravenous  Once 04/03/13 1720 04/03/13 1900     Assessment: 62 y/o female presented from SNF with AMS. Empiric broad-spectrum abx including vancomycin and primaxin were started for HCAP and this is day #4 of coverage. Since the dose of vancomycin is lower than nomogram (due to past history of being supra-therapeutic on recommended dose) and the patient has reached steady state, a vancomycin trough should be obtained. Patient currently does not have a fever and WBC has decreased to 22.9. Renal function has been stable (Scr 0.72) with good UOP.   Vancomcyin 10/1>> Primaxin 10/1>> Levaquin 10/1>>10/3  10/1 Blood x2 >> 1/2 GPC pairs & chains 10/1 Urine >> no growth 10/1 MRSA PCR >> neg  Goal of Therapy:  Vancomycin trough level 15-20 mcg/ml  Plan:  - Continue Primaxin 500mg  IV Q6H - Continue vancomycin 1gm IV Q12H - Obtain vancomycin trough @ 2030 - F/u renal fxn, cultures, WBC, clinical status, and vancomycin trough level  Henrietta Cieslewicz A. Lenon Ahmadi, PharmD Clinical Pharmacist - Resident Pager: [phone] Pharmacy: [phone] 04/06/2013 11:37 AM

## 2013-04-06 NOTE — Progress Notes (Signed)
ANTIBIOTIC CONSULT NOTE - FOLLOW UP  Pharmacy Consult for Vancomycin/Primaxin Indication: rule out pneumonia   Recent Labs  04/06/13 2024  VANCOTROUGH 17.0     Goal of Therapy:  Vancomycin trough level 15-20 mcg/ml  Plan:  -Continue vancomycin at current dose -See pharmacy note from earlier today for full history  Thank you for allowing me to take part in this patient's care,  Abran Duke, PharmD Clinical Pharmacist Phone: [phone] Pager: [phone] 04/06/2013 9:21 PM

## 2013-04-06 NOTE — Progress Notes (Signed)
CSW received referral that pt admitted from [facility].  CSW attempted to assess pt, however pt sleeping soundly at this time.  CSW to follow up at a later time and complete full psychosocial assessment.  Jacklynn Lewis, MSW, [employer]  Clinical Social Work Weekend coverage [phone]

## 2013-04-07 LAB — BASIC METABOLIC PANEL
BUN: 32 mg/dL — ABNORMAL HIGH (ref 6–23)
Chloride: 86 mEq/L — ABNORMAL LOW (ref 96–112)
Creatinine, Ser: 0.79 mg/dL (ref 0.50–1.10)
GFR calc Af Amer: >90 mL/min (ref >90)
GFR calc non Af Amer: 87 mL/min — ABNORMAL LOW (ref >90)
Potassium: 3.6 mEq/L (ref 3.5–5.1)
Sodium: 135 mEq/L (ref 135–145)

## 2013-04-07 LAB — CBC
HCT: 33.3 % — ABNORMAL LOW (ref 36.0–46.0)
Hemoglobin: 11.3 g/dL — ABNORMAL LOW (ref 12.0–15.0)
MCHC: 33.9 g/dL (ref 30.0–36.0)
RDW: 13.6 % (ref 11.5–15.5)
WBC: 11.8 10*3/uL — ABNORMAL HIGH (ref 4.0–10.5)

## 2013-04-07 LAB — GLUCOSE, CAPILLARY
Glucose-Capillary: 156 mg/dL — ABNORMAL HIGH (ref 70–99)
Glucose-Capillary: 156 mg/dL — ABNORMAL HIGH (ref 70–99)
Glucose-Capillary: 163 mg/dL — ABNORMAL HIGH (ref 70–99)
Glucose-Capillary: 164 mg/dL — ABNORMAL HIGH (ref 70–99)

## 2013-04-07 LAB — CULTURE, BLOOD (ROUTINE X 2)

## 2013-04-07 LAB — TROPONIN I: Troponin I: <0.3 ng/mL (ref <0.30)

## 2013-04-07 MED ORDER — METOLAZONE 5 MG PO TABS
5.0000 mg | ORAL_TABLET | Freq: Every day | ORAL | Status: DC
  Administered 2013-04-07 – 2013-04-09 (×3): 5 mg via ORAL
  Filled 2013-04-07 (×3): qty 1

## 2013-04-07 MED ORDER — INSULIN ASPART 100 UNIT/ML ~~LOC~~ SOLN
2.0000 [IU] | Freq: Three times a day (TID) | SUBCUTANEOUS | Status: DC
  Administered 2013-04-07: 4 [IU] via SUBCUTANEOUS
  Administered 2013-04-08: 2 [IU] via SUBCUTANEOUS
  Administered 2013-04-08: 4 [IU] via SUBCUTANEOUS

## 2013-04-07 MED ORDER — FUROSEMIDE 40 MG PO TABS
40.0000 mg | ORAL_TABLET | Freq: Two times a day (BID) | ORAL | Status: DC
  Administered 2013-04-07 – 2013-04-09 (×5): 40 mg via ORAL
  Filled 2013-04-07 (×7): qty 1

## 2013-04-07 MED ORDER — NITROGLYCERIN 0.4 MG SL SUBL
SUBLINGUAL_TABLET | SUBLINGUAL | Status: AC
  Administered 2013-04-07: 0.4 mg via SUBLINGUAL
  Filled 2013-04-07: qty 25

## 2013-04-07 MED ORDER — ISOSORBIDE MONONITRATE ER 30 MG PO TB24
30.0000 mg | ORAL_TABLET | Freq: Every day | ORAL | Status: DC
  Administered 2013-04-07 – 2013-04-09 (×3): 30 mg via ORAL
  Filled 2013-04-07 (×3): qty 1

## 2013-04-07 MED ORDER — GI COCKTAIL ~~LOC~~
30.0000 mL | Freq: Once | ORAL | Status: AC
  Administered 2013-04-07: 03:00:00 30 mL via ORAL
  Filled 2013-04-07: qty 30

## 2013-04-07 MED ORDER — DIVALPROEX SODIUM ER 250 MG PO TB24
250.0000 mg | ORAL_TABLET | Freq: Two times a day (BID) | ORAL | Status: DC
  Administered 2013-04-07 – 2013-04-09 (×5): 250 mg via ORAL
  Filled 2013-04-07 (×6): qty 1

## 2013-04-07 MED ORDER — ARIPIPRAZOLE 10 MG PO TABS
10.0000 mg | ORAL_TABLET | Freq: Every day | ORAL | Status: DC
  Administered 2013-04-07 – 2013-04-09 (×3): 10 mg via ORAL
  Filled 2013-04-07 (×3): qty 1

## 2013-04-07 MED ORDER — DULOXETINE HCL 60 MG PO CPEP
60.0000 mg | ORAL_CAPSULE | Freq: Two times a day (BID) | ORAL | Status: DC
  Administered 2013-04-07 – 2013-04-09 (×5): 60 mg via ORAL
  Filled 2013-04-07 (×6): qty 1

## 2013-04-07 MED ORDER — NITROGLYCERIN 0.4 MG SL SUBL
SUBLINGUAL_TABLET | SUBLINGUAL | Status: AC
  Administered 2013-04-07: 03:00:00
  Filled 2013-04-07: qty 75

## 2013-04-07 MED ORDER — GABAPENTIN 600 MG PO TABS
600.0000 mg | ORAL_TABLET | Freq: Three times a day (TID) | ORAL | Status: DC
  Administered 2013-04-07 – 2013-04-09 (×7): 600 mg via ORAL
  Filled 2013-04-07 (×9): qty 1

## 2013-04-07 MED ORDER — MORPHINE SULFATE 2 MG/ML IJ SOLN: 1.0000 mg | INTRAMUSCULAR | Status: DC | PRN

## 2013-04-07 MED ORDER — NITROGLYCERIN 2 % TD OINT
0.5000 [in_us] | TOPICAL_OINTMENT | Freq: Four times a day (QID) | TRANSDERMAL | Status: DC
  Administered 2013-04-07: 04:00:00 1 [in_us] via TOPICAL
  Administered 2013-04-07 – 2013-04-08 (×5): 0.5 [in_us] via TOPICAL
  Filled 2013-04-07 (×2): qty 30

## 2013-04-07 MED ORDER — CLONIDINE HCL 0.2 MG PO TABS
0.2000 mg | ORAL_TABLET | Freq: Two times a day (BID) | ORAL | Status: DC
  Administered 2013-04-07 – 2013-04-09 (×5): 0.2 mg via ORAL
  Filled 2013-04-07 (×6): qty 1

## 2013-04-07 NOTE — Progress Notes (Addendum)
[facility] PROGRESS NOTE  [name] ZOX:[id number] DOB: Mar 15, 1951 DOA: 04/03/2013 PCP: Karlene Einstein, MD  Interval history 62 year old female with history of COPD, obesity hypoventilation and multiple previous episodes of respiratory failure who was found unresponsive by SNF staff and EMS was called. When EMS arrived the patient was altered but was able to protect her airway. Patient was placed on 100% NRB and brought to the ER where she was placed on BiPAP and showed signs of improvement. Patient was admitted to ICU. Was started on solumedrol and antibiotics, and when stable was transferred to floor. Last night transferred to SDU for chest pain, shortness of breath. Now on bipap.    Assessment/Plan: 1. COPD Exacerbation- Patient now on  Bipap, continue with solumedrol 40 mg IV q 6 hrs, Continue Duoneb nebulizer q 6 hr prn. Will try to wean off bipap  2. Chest pain- Resolved, cardiac enzymes x3 are negative < BNP is mildly elevated to 387, will restart her lasix, Imdur.  3. Hypertension- BP is elevated, will restart her clonidine 0.2 mg po BID  4. Bacteremia- Blood cultures drawn on 10/1, one out of four bottles growing GPC in pairs and chains, the final culture is resulted and it is group b strep, sensitive to vancomycin.  5. HCAP- last CXR on 10/4, did not show infiltrate, CXR on 10/1 showed right base consolidation, she has been afebrile. patient  Will be continued on vancomycin and primaxin, wbc is improving. Repeat CBC in am.  6. Metabolic acidosis- chronically elevated Co2, will continue to monitor .  7. Diabetes Mellitus- Blood glucose is stable, continue SSI  8. DVT prophylaxis- Heparin.  Code Status: Full  Family Communication: *Discussed with patient in detail Disposition Plan: SNF   Consultants:  CCM  Procedures:  None  Antibiotics:  Vancomycin 10/1  Primaxin 10/1  HPI/Subjective: Patient seen and examined, last night transferred to SDU for  the chest pain, shortness of breath. She is currently on Bipap. Denies pain at this time.  Objective: Filed Vitals:   04/07/13 1300  BP: 154/88  Pulse: 104  Temp:   Resp: 25    Intake/Output Summary (Last 24 hours) at 04/07/13 1428 Last data filed at 04/07/13 0700  Gross per 24 hour  Intake   1260 ml  Output      0 ml  Net   1260 ml   Filed Weights   04/05/13 2020 04/07/13 0559 04/07/13 0600  Weight: 137 kg (302 lb 0.5 oz) 133.3 kg (293 lb 14 oz) 133.3 kg (293 lb 14 oz)    Exam:   General:  Appear in no acute distress  Cardiovascular: S1s2 RRR  Respiratory: Decreased breath sounds bilaterally  Abdomen: Soft, nontender  Musculoskeletal: No edema of the lower extremities  Data Reviewed: Basic Metabolic Panel:  Recent Labs Lab 04/03/13 2039 04/04/13 0355 04/05/13 0418 04/06/13 0621 04/07/13 0319  NA 142 142 137 137 135  K 3.3* 4.0 3.3* 3.9 3.6  CL 84* 87* 85* 87* 86*  CO2 >45* >45* >45* 40* 44*  GLUCOSE 141* 147* 135* 149* 161*  BUN 36* 34* 25* 30* 32*  CREATININE 0.82 0.75 0.73 0.72 0.79  CALCIUM 9.8 10.2 10.3 10.1 9.6  MG 1.4* 1.5 1.9  --   --   PHOS 2.2* 2.0* 3.2  --   --    Liver Function Tests:  Recent Labs Lab 04/03/13 1655 04/03/13 2039  AST 35 34  ALT 18 17  ALKPHOS 70 60  BILITOT 0.3  0.3  PROT 8.2 7.7  ALBUMIN 3.9 3.5   No results found for this basename: LIPASE, AMYLASE,  in the last 168 hours No results found for this basename: AMMONIA,  in the last 168 hours CBC:  Recent Labs Lab 04/03/13 1655 04/03/13 2039 04/04/13 0355 04/04/13 1154 04/05/13 0730 04/07/13 0319  WBC 17.1* 22.5* 28.2* 25.7* 22.9* 11.8*  NEUTROABS 15.8* 21.1*  --  24.2* 21.4*  --   HGB 11.0* 10.8* 10.8* 10.3* 10.7* 11.3*  HCT 35.1* 33.5* 33.3* 32.4* 32.5* 33.3*  MCV 92.1 92.3 91.2 91.3 90.5 87.2  PLT 186 177 167 178 189 228   Cardiac Enzymes:  Recent Labs Lab 04/03/13 1655 04/03/13 2040 04/04/13 0110 04/04/13 0716 04/07/13 0319  TROPONINI <0.30  <0.30 <0.30 <0.30 <0.30   BNP (last 3 results)  Recent Labs  11/05/12 1800 01/19/13 0231 04/03/13 2040  PROBNP 332.1* 1633.0* 387.1*   CBG:  Recent Labs Lab 04/06/13 2032 04/06/13 2351 04/07/13 0354 04/07/13 0741 04/07/13 1141  GLUCAP 183* 163* 158* 161* 163*    Recent Results (from the past 240 hour(s))  CULTURE, BLOOD (ROUTINE X 2)     Status: None   Collection Time    04/03/13  4:40 PM      Result Value Range Status   Specimen Description BLOOD HAND LEFT   Final   Special Requests BOTTLES DRAWN AEROBIC ONLY 2CC   Final   Culture  Setup Time     Final   Value: 04/04/2013 00:16     Performed at [facility]   Culture     Final   Value: GROUP B STREP(S.AGALACTIAE)ISOLATED     Note: Gram Stain Report Called to,Read Back By and Verified With: Misty Stanley BECK @ 1029 04/04/13 BY KRAWS     Performed at [facility]   Report Status 04/07/2013 FINAL   Final   Organism ID, Bacteria GROUP B STREP(S.AGALACTIAE)ISOLATED   Final  CULTURE, BLOOD (ROUTINE X 2)     Status: None   Collection Time    04/03/13  4:53 PM      Result Value Range Status   Specimen Description BLOOD RIGHT FOREARM   Final   Special Requests BOTTLES DRAWN AEROBIC ONLY 2CC   Final   Culture  Setup Time     Final   Value: 04/04/2013 00:16     Performed at [facility]   Culture     Final   Value:        BLOOD CULTURE RECEIVED NO GROWTH TO DATE CULTURE WILL BE HELD FOR 5 DAYS BEFORE ISSUING A FINAL NEGATIVE REPORT     Performed at [facility]   Report Status PENDING   Incomplete  CULTURE, BLOOD (ROUTINE X 2)     Status: None   Collection Time    04/03/13  8:37 PM      Result Value Range Status   Specimen Description BLOOD ARM RIGHT   Final   Special Requests BOTTLES DRAWN AEROBIC ONLY 1CC   Final   Culture  Setup Time     Final   Value: 04/04/2013 00:43     Performed at [facility]   Culture     Final   Value:        BLOOD CULTURE RECEIVED NO GROWTH TO  DATE CULTURE WILL BE HELD FOR 5 DAYS BEFORE ISSUING A FINAL NEGATIVE REPORT     Performed at [facility]   Report Status PENDING  Incomplete  CULTURE, BLOOD (ROUTINE X 2)     Status: None   Collection Time    04/03/13  8:45 PM      Result Value Range Status   Specimen Description BLOOD HAND RIGHT   Final   Special Requests BOTTLES DRAWN AEROBIC ONLY 1CC   Final   Culture  Setup Time     Final   Value: 04/04/2013 00:42     Performed at [facility]   Culture     Final   Value:        BLOOD CULTURE RECEIVED NO GROWTH TO DATE CULTURE WILL BE HELD FOR 5 DAYS BEFORE ISSUING A FINAL NEGATIVE REPORT     Performed at [facility]   Report Status PENDING   Incomplete  MRSA PCR SCREENING     Status: None   Collection Time    04/03/13 10:24 PM      Result Value Range Status   MRSA by PCR NEGATIVE  NEGATIVE Final   Comment:            The GeneXpert MRSA Assay (FDA     approved for NASAL specimens     only), is one component of a     comprehensive MRSA colonization     surveillance program. It is not     intended to diagnose MRSA     infection nor to guide or     monitor treatment for     MRSA infections.  URINE CULTURE     Status: None   Collection Time    04/03/13 10:41 PM      Result Value Range Status   Specimen Description URINE, CATHETERIZED   Final   Special Requests NONE   Final   Culture  Setup Time     Final   Value: 04/04/2013 00:55     Performed at [facility]   Colony Count     Final   Value: NO GROWTH     Performed at [facility]   Culture     Final   Value: NO GROWTH     Performed at [facility]   Report Status 04/05/2013 FINAL   Final     Studies: Dg Chest Port 1 View  04/06/2013   CLINICAL DATA:  Right basilar atelectasis versus infiltrate  EXAM: PORTABLE CHEST - 1 VIEW  COMPARISON:  04/05/2013  FINDINGS: The heart size appears normal. Lung volumes are low and there is asymmetric elevation of the right  hemidiaphragm.  IMPRESSION: 1. Stable low lung volumes with asymmetric elevation of the right hemidiaphragm.   Electronically Signed   By: Signa Kell M.D.   On: 04/06/2013 07:51    Scheduled Meds: . amLODipine  5 mg Oral Daily  . antiseptic oral rinse  15 mL Mouth Rinse q12n4p  . ARIPiprazole  10 mg Oral Daily  . chlorhexidine  15 mL Mouth Rinse BID  . cloNIDine  0.2 mg Oral BID  . divalproex  250 mg Oral BID  . DULoxetine  60 mg Oral BID  . furosemide  40 mg Oral BID  . gabapentin  600 mg Oral TID  . heparin  5,000 Units Subcutaneous Q8H  . imipenem-cilastatin  500 mg Intravenous Q6H  . insulin aspart  2-6 Units Subcutaneous Q4H  . isosorbide mononitrate  30 mg Oral Daily  . methylPREDNISolone (SOLU-MEDROL) injection  40 mg Intravenous Q6H  . metolazone  5 mg Oral Daily  . nitroGLYCERIN  0.5 inch Topical Q6H  . pantoprazole (PROTONIX) IV  40 mg Intravenous Q24H  . simethicone  80 mg Oral QID  . vancomycin  1,000 mg Intravenous Q12H   Continuous Infusions: . sodium chloride 10 mL/hr at 04/05/13 1658    Principal Problem:   Acute-on-chronic respiratory failure Active Problems:   OBSTRUCTIVE SLEEP APNEA   C O P D   Chronic respiratory failure    Time spent: 25 min    [facility] S  [facility] Pager [phone]. If 7PM-7AM, please contact night-coverage at [email or web address], password [facility] 04/07/2013, 2:28 PM  LOS: 4 days

## 2013-04-07 NOTE — Progress Notes (Signed)
Patient c/o chest pain of 10/10, oxygen 3L nasal canula in place, nitroglycerin 0.4mg  given, will continue to monitor.

## 2013-04-07 NOTE — Progress Notes (Addendum)
Night call note: 3:00 AM patient seen and evaluated for left sided substernal chest pain.  States she often gets similar chest pain, rates pain 10 out of 10 in severity, worse when she takes a deep breath in.  CBC reviewed and her leukocytosis appears to be resolving, now down to 11k.   1.  Chest pain - EKG ordered and reviewed, but EKG is not worrisome for new ischemia (actually tachycardia seen on admission and ST segment depressions seen on admission seem to have resolved on this new EKG).  Stat troponins and serial troponins are ordered.  SL NTG x3 was given with modest improvement in symptoms CP decreased from a 10 to a 7 in intensity.  Have therefore ordered NTG paste 0.5 inch.  Re eval in 15 mins, increase to 1 inch BP permitting.  Very sparing use of opioids ordered, 1mg  morphine Q1H PRN, advised nurses about high risk for further hypoventilation with morphine.  Nurses connecting continuous pulse ox now.  Most likely the patients chest pain is secondary to pleuritis from her HCAP as opposed to new ACS, although with improvement on NTG ACS is certainly still in the differential.  Alternative DDX considered include but not limited to: PE - possible, but patient is on heparin DVT PPX, has no tachycardia, and is certainly hypertensive and not hypotensive, may wish to get CTA in am if symptoms persist and work up continues to be negative for ACS. Aortic Dissection - I have considered this, but feal this to be unlikely as the quality of the chest pain does not seem to be classic for this.  2. HVOS - Patient desaturated as soon as she fell asleep, desat down to 80s.  Patients O2 sat improved just as soon as she woke back up, had a discussion with patient, and she agrees to try and sleep with BIPAP at this time (which has been continuously recommended in the past by pulmonary, please see their notes).  Given this agreement on the patients part, will transfer to SDU for BIPAP, hope she will be compliant  with this.

## 2013-04-07 NOTE — Progress Notes (Signed)
Critical CO2 of 44 received from lab, Dr. Julian Reil aware. Received new orders.

## 2013-04-08 DIAGNOSIS — R7881 Bacteremia: Secondary | ICD-10-CM | POA: Diagnosis present

## 2013-04-08 DIAGNOSIS — R079 Chest pain, unspecified: Secondary | ICD-10-CM | POA: Diagnosis present

## 2013-04-08 LAB — GLUCOSE, CAPILLARY
Glucose-Capillary: 135 mg/dL — ABNORMAL HIGH (ref 70–99)
Glucose-Capillary: 169 mg/dL — ABNORMAL HIGH (ref 70–99)
Glucose-Capillary: 144 mg/dL — ABNORMAL HIGH (ref 70–99)

## 2013-04-08 LAB — HEMOGLOBIN A1C
Hgb A1c MFr Bld: 6.1 % — ABNORMAL HIGH (ref <5.7)
Mean Plasma Glucose: 128 mg/dL — ABNORMAL HIGH (ref <117)

## 2013-04-08 MED ORDER — METHYLPREDNISOLONE SODIUM SUCC 40 MG IJ SOLR
40.0000 mg | Freq: Two times a day (BID) | INTRAMUSCULAR | Status: DC
  Filled 2013-04-08: qty 1

## 2013-04-08 MED ORDER — INSULIN ASPART 100 UNIT/ML ~~LOC~~ SOLN
0.0000 [IU] | Freq: Three times a day (TID) | SUBCUTANEOUS | Status: DC
  Administered 2013-04-08: 1 [IU] via SUBCUTANEOUS

## 2013-04-08 MED ORDER — METHYLPREDNISOLONE SODIUM SUCC 40 MG IJ SOLR
20.0000 mg | Freq: Two times a day (BID) | INTRAMUSCULAR | Status: DC
  Administered 2013-04-09: 20 mg via INTRAVENOUS
  Filled 2013-04-08 (×2): qty 0.5

## 2013-04-08 MED ORDER — ALBUTEROL SULFATE (5 MG/ML) 0.5% IN NEBU: 2.5000 mg | INHALATION_SOLUTION | RESPIRATORY_TRACT | Status: DC | PRN

## 2013-04-08 MED ORDER — PANTOPRAZOLE SODIUM 40 MG PO TBEC
40.0000 mg | DELAYED_RELEASE_TABLET | Freq: Every day | ORAL | Status: DC
  Administered 2013-04-08 – 2013-04-09 (×2): 40 mg via ORAL
  Filled 2013-04-08 (×2): qty 1

## 2013-04-08 MED ORDER — LEVOFLOXACIN IN D5W 750 MG/150ML IV SOLN
750.0000 mg | INTRAVENOUS | Status: DC
  Administered 2013-04-08: 750 mg via INTRAVENOUS
  Filled 2013-04-08 (×2): qty 150

## 2013-04-08 MED ORDER — TIOTROPIUM BROMIDE MONOHYDRATE 18 MCG IN CAPS
18.0000 ug | ORAL_CAPSULE | Freq: Every day | RESPIRATORY_TRACT | Status: DC
  Filled 2013-04-08: qty 5

## 2013-04-08 MED ORDER — BUDESONIDE-FORMOTEROL FUMARATE 160-4.5 MCG/ACT IN AERO
2.0000 | INHALATION_SPRAY | Freq: Two times a day (BID) | RESPIRATORY_TRACT | Status: DC
Start: 2013-04-08 — End: 2013-04-08
  Filled 2013-04-08: qty 6

## 2013-04-08 MED ORDER — TIOTROPIUM BROMIDE MONOHYDRATE 18 MCG IN CAPS
18.0000 ug | ORAL_CAPSULE | Freq: Every day | RESPIRATORY_TRACT | Status: DC
  Administered 2013-04-09: 18 ug via RESPIRATORY_TRACT
  Filled 2013-04-08: qty 5

## 2013-04-08 MED ORDER — BUDESONIDE-FORMOTEROL FUMARATE 160-4.5 MCG/ACT IN AERO
2.0000 | INHALATION_SPRAY | Freq: Two times a day (BID) | RESPIRATORY_TRACT | Status: DC
  Administered 2013-04-08 – 2013-04-09 (×2): 2 via RESPIRATORY_TRACT
  Filled 2013-04-08: qty 6

## 2013-04-08 NOTE — Clinical Social Work Note (Signed)
CSW met with pt and introduced herself. Pt is from [facility]. Pt has 1 daughter, 2 grandchildren, and 1 great-grandson that all live in [place]. CSW called [facility] to let them know that pt will be returning upon d/c.   Maryclare Labrador, MSW, [facility] Clinical Social Worker [phone]

## 2013-04-08 NOTE — Progress Notes (Addendum)
[facility] Progress Note [place] TEAM 1 - Stepdown ICU Team   [name] ZOX:[id number] DOB: December 19, 1950 DOA: 04/03/2013 PCP: Karlene Einstein, MD  Brief narrative: 62 year old female with history of COPD, obesity hypoventilation and multiple previous episodes of respiratory failure who was found unresponsive by SNF staff and EMS was called. When EMS arrived the patient was altered but was able to protect her airway. Patient was placed on 100% NRB and brought to the ER where she was placed on BiPAP and showed signs of improvement. Patient was admitted to ICU, tarted on solumedrol and antibiotics, and when stable was transferred to floor.  After transfer out to floor she developed CP in conjunction with acute hypoxic respiratory failure that required BiPAP so she was transferred to the SDU. An ABG was not done.  Subsequently EKG and cardiac enzymes have been negative. Pt has known OHS/OSA and suspect this contributed to acute respiratory sx's.  Assessment/Plan:    Acute on chronic respiratory failure w/ Hypoxemia due to:   A) OBSTRUCTIVE SLEEP APNEA   B) C O P D -sx's rapidly corrected after initiation of BiPAP and suspect primarily due to decompensated OSA -provide HS CPAP- uses 3L cannula chronically -no wheezing or other evidence of COPD exacerbation- given h/o bipolar d/o would use steroids sparingly to prevent psychosis therefore will rapidly taper and dc Solu Medrol -resume home Spiriva and Symbicort -it should be assured pt is using CPAP QHS at he SNF (suscpect she is noncompliant)     Chest pain, unspecified /  G E R D -not c/w ischemia and suspect GI etiology -no CP today -cont PPI    Positive blood culture -1/4 positive for strep - narrow anbx's to Levaquin (PCN allergy) -will plan for 10 days treatment    Essential hypertension, benign -controlled -cont Norvasc, Catapres and Imdur   Grade 1 DD -cont tx BP  -cont Lasix/Metolazone    Type II DM -cont  SSI-CBG well controlled despite steroids -was not on meds pre admit -cont Neurontin    Morbid obesity    BIPOLAR DISORDER UNSPECIFIED -cont Abilify    Anemia of other chronic disease -hgb stable   DVT prophylaxis: Subcutaneous heparin Code Status: Full Family Communication: Patient only - no family at bedside Disposition Plan/Expected LOS: Transfer to floor and back to Dr. Sharl Ma if possible - possible D/C 10/7  Consultants: None  Procedures: Transthoracic Echocardiography Left ventricle: The cavity size was normal. Wall thickness was increased in a pattern of mild LVH. Systolic function was normal. The estimated ejection fraction was in the range of 60% to 65%. Wall motion was normal; there were no regional wall motion abnormalities. Doppler parameters are consistent with abnormal left ventricular relaxation (grade 1 diastolic dysfunction).   Antibiotics: Primaxin 10/1 >>> 10/6 Vancomycin 10/1 >>> 10/6 Levaquin 10/1 >>> 10/2, 10/ 6 >>>  HPI/Subjective: Awake- c/o's thirst- no CP or SOB.  Denies f/c, n/v, or abdom pain.    Objective: Blood pressure 126/76, pulse 92, temperature 98 F (36.7 C), temperature source Axillary, resp. rate 22, height 5\' 4"  (1.626 m), weight 131.1 kg (289 lb 0.4 oz), SpO2 97.00%.  Intake/Output Summary (Last 24 hours) at 04/08/13 1245 Last data filed at 04/08/13 1000  Gross per 24 hour  Intake    240 ml  Output      0 ml  Net    240 ml   Exam: General: No acute respiratory distress at rest  Lungs: Clear to auscultation bilaterally without wheezes  or crackles, 3L Cardiovascular: Regular rate and rhythm without murmur gallop or rub normal S1 and S2, no JVD- obese extremities with chronic lymphedema Abdomen: Nontender, nondistended, soft, bowel sounds positive, no rebound, no ascites, no appreciable mass Musculoskeletal: No significant cyanosis, clubbing of bilateral lower extremities Neurological: Alert and oriented x 3, CN 2-12  intact  Scheduled Meds:  Scheduled Meds: . amLODipine  5 mg Oral Daily  . antiseptic oral rinse  15 mL Mouth Rinse q12n4p  . ARIPiprazole  10 mg Oral Daily  . chlorhexidine  15 mL Mouth Rinse BID  . cloNIDine  0.2 mg Oral BID  . divalproex  250 mg Oral BID  . DULoxetine  60 mg Oral BID  . furosemide  40 mg Oral BID  . gabapentin  600 mg Oral TID  . heparin  5,000 Units Subcutaneous Q8H  . insulin aspart  2-6 Units Subcutaneous TID WC & HS  . isosorbide mononitrate  30 mg Oral Daily  . levofloxacin (LEVAQUIN) IV  750 mg Intravenous Q24H  . methylPREDNISolone (SOLU-MEDROL) injection  40 mg Intravenous Q6H  . metolazone  5 mg Oral Daily  . pantoprazole  40 mg Oral Daily  . simethicone  80 mg Oral QID    Data Reviewed: Basic Metabolic Panel:  Recent Labs Lab 04/03/13 2039 04/04/13 0355 04/05/13 0418 04/06/13 0621 04/07/13 0319  NA 142 142 137 137 135  K 3.3* 4.0 3.3* 3.9 3.6  CL 84* 87* 85* 87* 86*  CO2 >45* >45* >45* 40* 44*  GLUCOSE 141* 147* 135* 149* 161*  BUN 36* 34* 25* 30* 32*  CREATININE 0.82 0.75 0.73 0.72 0.79  CALCIUM 9.8 10.2 10.3 10.1 9.6  MG 1.4* 1.5 1.9  --   --   PHOS 2.2* 2.0* 3.2  --   --    Liver Function Tests:  Recent Labs Lab 04/03/13 1655 04/03/13 2039  AST 35 34  ALT 18 17  ALKPHOS 70 60  BILITOT 0.3 0.3  PROT 8.2 7.7  ALBUMIN 3.9 3.5   CBC:  Recent Labs Lab 04/03/13 1655 04/03/13 2039 04/04/13 0355 04/04/13 1154 04/05/13 0730 04/07/13 0319  WBC 17.1* 22.5* 28.2* 25.7* 22.9* 11.8*  NEUTROABS 15.8* 21.1*  --  24.2* 21.4*  --   HGB 11.0* 10.8* 10.8* 10.3* 10.7* 11.3*  HCT 35.1* 33.5* 33.3* 32.4* 32.5* 33.3*  MCV 92.1 92.3 91.2 91.3 90.5 87.2  PLT 186 177 167 178 189 228   Cardiac Enzymes:  Recent Labs Lab 04/03/13 1655 04/03/13 2040 04/04/13 0110 04/04/13 0716 04/07/13 0319  TROPONINI <0.30 <0.30 <0.30 <0.30 <0.30   BNP (last 3 results)  Recent Labs  11/05/12 1800 01/19/13 0231 04/03/13 2040  PROBNP 332.1*  1633.0* 387.1*   CBG:  Recent Labs Lab 04/07/13 1141 04/07/13 1602 04/07/13 1814 04/07/13 2255 04/08/13 0725  GLUCAP 163* 164* 156* 156* 135*    Recent Results (from the past 240 hour(s))  CULTURE, BLOOD (ROUTINE X 2)     Status: None   Collection Time    04/03/13  4:40 PM      Result Value Range Status   Specimen Description BLOOD HAND LEFT   Final   Special Requests BOTTLES DRAWN AEROBIC ONLY 2CC   Final   Culture  Setup Time     Final   Value: 04/04/2013 00:16     Performed at [facility]   Culture     Final   Value: GROUP B STREP(S.AGALACTIAE)ISOLATED     Note: Gram Stain  Report Called to,Read Back By and Verified With: Misty Stanley BECK @ 1029 04/04/13 BY KRAWS     Performed at [facility]   Report Status 04/07/2013 FINAL   Final   Organism ID, Bacteria GROUP B STREP(S.AGALACTIAE)ISOLATED   Final  CULTURE, BLOOD (ROUTINE X 2)     Status: None   Collection Time    04/03/13  4:53 PM      Result Value Range Status   Specimen Description BLOOD RIGHT FOREARM   Final   Special Requests BOTTLES DRAWN AEROBIC ONLY 2CC   Final   Culture  Setup Time     Final   Value: 04/04/2013 00:16     Performed at [facility]   Culture     Final   Value:        BLOOD CULTURE RECEIVED NO GROWTH TO DATE CULTURE WILL BE HELD FOR 5 DAYS BEFORE ISSUING A FINAL NEGATIVE REPORT     Performed at [facility]   Report Status PENDING   Incomplete  CULTURE, BLOOD (ROUTINE X 2)     Status: None   Collection Time    04/03/13  8:37 PM      Result Value Range Status   Specimen Description BLOOD ARM RIGHT   Final   Special Requests BOTTLES DRAWN AEROBIC ONLY 1CC   Final   Culture  Setup Time     Final   Value: 04/04/2013 00:43     Performed at [facility]   Culture     Final   Value:        BLOOD CULTURE RECEIVED NO GROWTH TO DATE CULTURE WILL BE HELD FOR 5 DAYS BEFORE ISSUING A FINAL NEGATIVE REPORT     Performed at [facility]   Report  Status PENDING   Incomplete  CULTURE, BLOOD (ROUTINE X 2)     Status: None   Collection Time    04/03/13  8:45 PM      Result Value Range Status   Specimen Description BLOOD HAND RIGHT   Final   Special Requests BOTTLES DRAWN AEROBIC ONLY 1CC   Final   Culture  Setup Time     Final   Value: 04/04/2013 00:42     Performed at [facility]   Culture     Final   Value:        BLOOD CULTURE RECEIVED NO GROWTH TO DATE CULTURE WILL BE HELD FOR 5 DAYS BEFORE ISSUING A FINAL NEGATIVE REPORT     Performed at [facility]   Report Status PENDING   Incomplete  MRSA PCR SCREENING     Status: None   Collection Time    04/03/13 10:24 PM      Result Value Range Status   MRSA by PCR NEGATIVE  NEGATIVE Final   Comment:            The GeneXpert MRSA Assay (FDA     approved for NASAL specimens     only), is one component of a     comprehensive MRSA colonization     surveillance program. It is not     intended to diagnose MRSA     infection nor to guide or     monitor treatment for     MRSA infections.  URINE CULTURE     Status: None   Collection Time    04/03/13 10:41 PM      Result Value Range Status   Specimen  Description URINE, CATHETERIZED   Final   Special Requests NONE   Final   Culture  Setup Time     Final   Value: 04/04/2013 00:55     Performed at [facility]   Colony Count     Final   Value: NO GROWTH     Performed at [facility]   Culture     Final   Value: NO GROWTH     Performed at [facility]   Report Status 04/05/2013 FINAL   Final     Studies:  Recent x-ray studies have been reviewed in detail by the Attending Physician  Junious Silk, ANP [facility] Office  [phone] Pager [phone]  **If unable to reach the above provider after paging please contact the Flow Manager @ [phone]  On-Call/Text Page:      [email or web address]      password [email or web address]  If 7PM-7AM, please contact night-coverage [email or web address] Password  [email or web address] 04/08/2013, 12:45 PM   LOS: 5 days   I have personally examined this patient and reviewed the entire database. I have reviewed the above note, made any necessary editorial changes, and agree with its content.  Lonia Blood, MD [facility]

## 2013-04-09 DIAGNOSIS — B951 Streptococcus, group B, as the cause of diseases classified elsewhere: Secondary | ICD-10-CM

## 2013-04-09 DIAGNOSIS — F319 Bipolar disorder, unspecified: Secondary | ICD-10-CM

## 2013-04-09 DIAGNOSIS — G4733 Obstructive sleep apnea (adult) (pediatric): Secondary | ICD-10-CM

## 2013-04-09 LAB — CBC
MCH: 29 pg (ref 26.0–34.0)
Platelets: 245 10*3/uL (ref 150–400)
RBC: 3.72 MIL/uL — ABNORMAL LOW (ref 3.87–5.11)

## 2013-04-09 LAB — GLUCOSE, CAPILLARY: Glucose-Capillary: 88 mg/dL (ref 70–99)

## 2013-04-09 LAB — COMPREHENSIVE METABOLIC PANEL
ALT: 23 U/L (ref 0–35)
AST: 24 U/L (ref 0–37)
Alkaline Phosphatase: 45 U/L (ref 39–117)
CO2: >45 mEq/L (ref 19–32)
Calcium: 9.4 mg/dL (ref 8.4–10.5)
GFR calc Af Amer: 59 mL/min — ABNORMAL LOW (ref >90)
GFR calc non Af Amer: 51 mL/min — ABNORMAL LOW (ref >90)
Potassium: 3.7 mEq/L (ref 3.5–5.1)
Sodium: 135 mEq/L (ref 135–145)
Total Bilirubin: 0.2 mg/dL — ABNORMAL LOW (ref 0.3–1.2)
Total Protein: 6.8 g/dL (ref 6.0–8.3)

## 2013-04-09 MED ORDER — LEVOFLOXACIN 750 MG PO TABS: 750.0000 mg | ORAL_TABLET | Freq: Every day | ORAL | Status: AC

## 2013-04-09 MED ORDER — PREDNISONE 20 MG PO TABS: 40.0000 mg | ORAL_TABLET | Freq: Every day | ORAL | Status: AC

## 2013-04-09 MED ORDER — FAMOTIDINE 20 MG PO TABS: 20.0000 mg | ORAL_TABLET | Freq: Two times a day (BID) | ORAL | Status: DC

## 2013-04-09 NOTE — Evaluation (Signed)
Physical Therapy Evaluation Patient Details Name: [name] MRN: [id number] DOB: [birth year] Today's Date: 04/09/2013 Time: 7846-9629 PT Time Calculation (min): 21 min  PT Assessment / Plan / Recommendation History of Present Illness  Pt presents from SNF after being found unresponsive  Clinical Impression  Pt total assist at baseline and currently at baseline function. Pt able to roll and use bedpan when questioned and encouraged to call for assist. Attempted to get maximove to lift pt to chair however battery not charged and lift needing to be serviced so pt returned to supine in chair position with RN made aware. Recommend OOB daily with lift via nursing. No further acute therapy needs will sign off.     PT Assessment  Patent does not need any further PT services    Follow Up Recommendations  No PT follow up (return to SNF)    Does the patient have the potential to tolerate intense rehabilitation      Barriers to Discharge        Equipment Recommendations  None recommended by PT    Recommendations for Other Services     Frequency      Precautions / Restrictions Precautions Precautions: Fall Precaution Comments: bipap   Pertinent Vitals/Pain No pain Bipap with sats 100%      Mobility  Bed Mobility Bed Mobility: Rolling Right;Rolling Left;Right Sidelying to Sit;Sit to Sidelying Right;Scooting to [facility] Rolling Right: 4: Min assist Rolling Left: 4: Min assist Right Sidelying to Sit: 3: Mod assist;HOB elevated;With rails Sit to Sidelying Right: 3: Mod assist;HOB flat Scooting to HOB: 1: +2 Total assist Scooting to [facility]: Patient Percentage: 20% Details for Bed Mobility Assistance: cueing for sequence with assist to fully elevate trunk from surface and bring legs back onto bed after sitting. Bed in trendelenburg to scoot Transfers Transfers: Sit to Stand;Stand to Sit Sit to Stand: 1: +2 Total assist;From bed Sit to Stand: Patient Percentage: 20% Stand to Sit: 1:  +2 Total assist;To bed Stand to Sit: Patient Percentage: 20% Details for Transfer Assistance: cueing for sequence with bed elevated. Pt able to lean anteriorly and attempt to accept weight on legs but unable to clear sacrum Ambulation/Gait Ambulation/Gait Assistance: Not tested (comment)    Exercises     PT Diagnosis:    PT Problem List:   PT Treatment Interventions:       PT Goals(Current goals can be found in the care plan section) Acute Rehab PT Goals PT Goal Formulation: No goals set, d/c therapy  Visit Information  Last PT Received On: 04/09/13 Assistance Needed: +2 History of Present Illness: Pt presents from SNF after being found unresponsive       Prior Functioning  Home Living Family/patient expects to be discharged to:: Skilled nursing facility Prior Function Level of Independence: Needs assistance Gait / Transfers Assistance Needed: pt is mod assist for bed mobility but total assist of hoyer for OOB to WC does not ambulate ADL's / Homemaking Assistance Needed: total assist for all ADLs Communication Communication: No difficulties    Cognition  Cognition Arousal/Alertness: Awake/alert Behavior During Therapy: WFL for tasks assessed/performed Overall Cognitive Status: Impaired/Different from baseline Area of Impairment: Memory Memory: Decreased short-term memory    Extremity/Trunk Assessment Upper Extremity Assessment Upper Extremity Assessment: Generalized weakness Lower Extremity Assessment Lower Extremity Assessment: Generalized weakness   Balance Balance Balance Assessed: Yes Static Sitting Balance Static Sitting - Balance Support: Bilateral upper extremity supported;Feet unsupported Static Sitting - Level of Assistance: 5: Stand by assistance Static Sitting -  Comment/# of Minutes: 4  End of Session PT - End of Session Activity Tolerance: Patient tolerated treatment well Patient left: in bed;with call bell/phone within reach Nurse Communication:  Mobility status  GP     [facility] 04/09/2013, 9:11 AM Delaney Meigs, PT [phone]

## 2013-04-09 NOTE — Progress Notes (Signed)
Report called to [facility] and given to [place].

## 2013-04-09 NOTE — Clinical Social Work Note (Signed)
CSW completed discharge packet and has called [facility] for transport. Pt to be discharged back to [facility].   Maryclare Labrador, MSW, [facility] Clinical Social Worker [phone]

## 2013-04-09 NOTE — Discharge Summary (Signed)
Physician Discharge Summary  [name] WJX:[id number] DOB: 11/15/1950 DOA: 04/03/2013  PCP: Karlene Einstein, MD  Admit date: 04/03/2013 Discharge date: 04/09/2013  Time spent: >45 minutes   Discharge Diagnoses:  Principal Problem:   Acute and chronic respiratory failure (acute-on-chronic) Active Problems:   Morbid obesity   BIPOLAR DISORDER UNSPECIFIED   OBSTRUCTIVE SLEEP APNEA   C O P D   G E R D   Essential hypertension, benign   Anemia of other chronic disease   Type II or unspecified type diabetes mellitus with neurological manifestations, not stated as uncontrolled(250.60)   Chest pain, unspecified   Bacteremia due to group B Streptococcus   Discharge Condition: stable  Diet recommendation: heart healty, ADA low carb, 2000 cal diet  Filed Weights   04/07/13 0600 04/08/13 0500 04/09/13 0423  Weight: 133.3 kg (293 lb 14 oz) 131.1 kg (289 lb 0.4 oz) 132 kg (291 lb 0.1 oz)    History of present illness:  62 year old female with history of COPD, obesity hypoventilation and multiple previous episodes of respiratory failure who was found unresponsive by SNF staff and EMS was called. When EMS arrived the patient was altered but was able to protect her airway. Patient was placed on 100% NRB and brought to the ER where she was placed on BiPAP and showed signs of improvement. Patient was admitted to ICU, started on solumedrol and antibiotics, and when stable was transferred to floor.  After transfer out to floor she developed CP in conjunction with acute hypoxic respiratory failure that required BiPAP so she was transferred to the SDU. This resolved with BiPAP.    Hospital Course:  Acute on chronic respiratory failure w/ Hypoxemia and hypercarbia due to:  A) OBSTRUCTIVE SLEEP APNEA  B) C O P D  - there was a question of whether she may have a pneumonia as she had a RLL infiltrate when she was admitted but this resolved within 2 days and therefore, was likely not pneumonia-  she does have an elevated right diaphragm - ABG- 7.34/ PCO2 114/ PO2 104/ Bicarb 61/ O2 sat 96 on admission  -symptoms rapidly corrected after initiation of BiPAP and suspect primarily due to decompensated OSA  -provide HS CPAP- uses 3L cannula chronically  -no wheezing or other evidence of COPD exacerbation- given h/o bipolar d/o would use steroids sparingly to prevent psychosis therefore will rapidly taper and dc Solu Medrol  -resume home Spiriva and Symbicort  -it should be assured pt is using CPAP QHS at he SNF (suscpect she is noncompliant)  - the patient's brother is here today- Revecca Nachtigal [phone] - he is also encouraging pt to use CPAP and will speak with the staff at the facility as well- she has been there since 2005 and they know her well.   Chest pain, unspecified / G E R D  -not c/w ischemia and suspect GI etiology  -no CP today  -started GI protection - will d/c to SNF with BID Pepcid  Positive blood culture  -1/4 positive for strep - narrow anbx's to Levaquin (PCN allergy)  -will plan for 10 days treatment  - to end on 10/11  Essential hypertension, benign  -controlled  -cont Norvasc, Catapres and Imdur   Grade 1 DD  -cont tx BP  -cont Lasix/Metolazone   Type II DM  -cont SSI-CBG well controlled despite steroids  -was not on meds pre admit  -cont Neurontin   Morbid obesity  - low fat/ low carb diet-  limit calories to 2000 / day  BIPOLAR DISORDER UNSPECIFIED  -cont Abilify   Anemia of other chronic disease  -hgb stable    Consultations:  none  Discharge Exam: Filed Vitals:   04/09/13 0921  BP: 133/62  Pulse: 70  Temp: 97.6  Resp: 17    General: AAO x 3 , no distress Cardiovascular: RRR, no murmurs Respiratory: CTA b/l   Discharge Instructions      Discharge Orders   Future Appointments Provider Department Dept Phone   04/17/2013 8:20 AM [facility] [place]  IMAGING [phone]   Patient should wear two  piece clothing and wear no powder or deodorant. Patient should arrive 15 minutes early.   Future Orders Complete By Expires   Diet - low sodium heart healthy  As directed    Comments:     Carb controlled ADA, Strict 2000 cal diet-   Discharge instructions  As directed    Comments:     Please ensure CPAP is on when she sleeps. Enforce this with patient please.   Increase activity slowly  As directed        Medication List         albuterol (2.5 MG/3ML) 0.083% nebulizer solution  Commonly known as:  PROVENTIL  Take 2.5 mg by nebulization every 6 (six) hours as needed for wheezing.     amLODipine 5 MG tablet  Commonly known as:  NORVASC  Take 5 mg by mouth daily.     ARIPiprazole 10 MG tablet  Commonly known as:  ABILIFY  Take 10 mg by mouth daily. For depressive psychosis     ascorbic acid 500 MG tablet  Commonly known as:  VITAMIN C  Take 500 mg by mouth 2 (two) times daily.     budesonide-formoterol 160-4.5 MCG/ACT inhaler  Commonly known as:  SYMBICORT  Inhale 2 puffs into the lungs 2 (two) times daily.     busPIRone 10 MG tablet  Commonly known as:  BUSPAR  Take 10 mg by mouth 2 (two) times daily.     CERTAGEN PO  Take 1 tablet by mouth daily.     cloNIDine 0.2 MG tablet  Commonly known as:  CATAPRES  Take 0.2 mg by mouth 2 (two) times daily.     divalproex 250 MG 24 hr tablet  Commonly known as:  DEPAKOTE ER  Take 1 tablet (250 mg total) by mouth 2 (two) times daily.     docusate sodium 100 MG capsule  Commonly known as:  COLACE  Take 100 mg by mouth daily.     DULoxetine 60 MG capsule  Commonly known as:  CYMBALTA  Take 60 mg by mouth 2 (two) times daily.     famotidine 20 MG tablet  Commonly known as:  PEPCID  Take 1 tablet (20 mg total) by mouth 2 (two) times daily.     feeding supplement (PRO-STAT SUGAR FREE 64) Liqd  Take 30 mLs by mouth 2 (two) times daily.     fluticasone 50 MCG/ACT nasal spray  Commonly known as:  FLONASE  Place 2 sprays  into the nose daily.     folic acid 1 MG tablet  Commonly known as:  FOLVITE  Take 1 mg by mouth daily.     furosemide 40 MG tablet  Commonly known as:  LASIX  Take 1 tablet (40 mg total) by mouth 2 (two) times daily.     gabapentin 600 MG tablet  Commonly known as:  NEURONTIN  Take 600 mg by mouth 3 (three) times daily.     isosorbide mononitrate 30 MG 24 hr tablet  Commonly known as:  IMDUR  Take 30 mg by mouth daily.     levofloxacin 750 MG tablet  Commonly known as:  LEVAQUIN  Take 1 tablet (750 mg total) by mouth daily.     metoCLOPramide 5 MG tablet  Commonly known as:  REGLAN  Take 5 mg by mouth 3 (three) times daily.     metolazone 5 MG tablet  Commonly known as:  ZAROXOLYN  Take 5 mg by mouth every 3 (three) days.     nitroGLYCERIN 0.4 MG SL tablet  Commonly known as:  NITROSTAT  Place 0.4 mg under the tongue every 5 (five) minutes as needed for chest pain.     oxyCODONE-acetaminophen 5-325 MG per tablet  Commonly known as:  PERCOCET/ROXICET  Take 1 tablet by mouth every 8 (eight) hours as needed. For pain     predniSONE 20 MG tablet  Commonly known as:  DELTASONE  Take 2 tablets (40 mg total) by mouth daily.     senna 8.6 MG Tabs tablet  Commonly known as:  SENOKOT  Take 2 tablets by mouth daily.     simethicone 80 MG chewable tablet  Commonly known as:  MYLICON  Chew 80 mg by mouth every 8 (eight) hours.     tiotropium 18 MCG inhalation capsule  Commonly known as:  SPIRIVA  Place 18 mcg into inhaler and inhale daily.     vitamin D (CHOLECALCIFEROL) 400 UNITS tablet  Take 400 Units by mouth daily.       Allergies  Allergen Reactions  . Penicillins Anaphylaxis    Tongue swelling  . Aspirin Other (See Comments)    Per MAR  . Bee Venom Other (See Comments)    Unknown  . Lisinopril     Per MAR  . Nyquil Cold & [Dm-Doxylamine-Acetaminophen] Other (See Comments)    Unknown   . Ppd [Tuberculin Purified Protein Derivative] Other (See Comments)     unknown      The results of significant diagnostics from this hospitalization (including imaging, microbiology, ancillary and laboratory) are listed below for reference.    Significant Diagnostic Studies: Ct Head Wo Contrast  04/03/2013   *RADIOLOGY REPORT*  Clinical Data: Altered mental status  CT HEAD WITHOUT CONTRAST  Technique:  Contiguous axial images were obtained from the base of the skull through the vertex without contrast.  Comparison: Prior CT from 11/05/2012  Findings: There is no acute intracranial hemorrhage or infarct. Mild chronic microvascular ischemic disease is unchanged.  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  No extra-axial fluid collection.  No midline shift or mass lesion.  Calvarium is intact.  Paranasal sinuses and mastoid air cells are clear.  IMPRESSION: 1.  No acute intracranial process. 2.  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 3.  Mild chronic microvascular ischemic disease.   Original Report Authenticated By: Rise Mu, M.D.   Dg Chest Port 1 View  04/06/2013   CLINICAL DATA:  Right basilar atelectasis versus infiltrate  EXAM: PORTABLE CHEST - 1 VIEW  COMPARISON:  04/05/2013  FINDINGS: The heart size appears normal. Lung volumes are low and there is asymmetric elevation of the right hemidiaphragm.  IMPRESSION: 1. Stable low lung volumes with asymmetric elevation of the right hemidiaphragm.   Electronically Signed   By: Signa Kell M.D.   On: 04/06/2013 07:51  Dg Chest Port 1 View  04/05/2013   CLINICAL DATA:  Follow-up respiratory failure  EXAM: PORTABLE CHEST - 1 VIEW  COMPARISON:  04/04/2013  FINDINGS: Low lung volumes with vascular crowding. No frank interstitial edema. No pleural effusion or pneumothorax.  Cardiomegaly.  IMPRESSION: Low lung volumes.   Electronically Signed   By: Charline Bills M.D.   On: 04/05/2013 07:38   Dg Chest Port 1 View  04/04/2013   CLINICAL DATA:  Intubated, shortness of Breath.   EXAM: PORTABLE CHEST - 1 VIEW  COMPARISON:  04/03/2013  FINDINGS: Very low lung volumes. Cardiomegaly with vascular congestion and bibasilar atelectasis. No significant change since prior study. No visible effusions.  IMPRESSION: No interval change.   Electronically Signed   By: Charlett Nose M.D.   On: 04/04/2013 06:16   Dg Chest Port 1 View  04/03/2013   CLINICAL DATA:  Shortness of Breath  EXAM: PORTABLE CHEST - 1 VIEW  COMPARISON:  January 21, 2013  FINDINGS: There is right base consolidation. Lungs are otherwise clear. Degree of inspiration shallow. Heart is upper normal in size with normal pulmonary vascularity. No adenopathy.  IMPRESSION: Right base consolidation.   Electronically Signed   By: Bretta Bang   On: 04/03/2013 17:03    Microbiology: Recent Results (from the past 240 hour(s))  CULTURE, BLOOD (ROUTINE X 2)     Status: None   Collection Time    04/03/13  4:40 PM      Result Value Range Status   Specimen Description BLOOD HAND LEFT   Final   Special Requests BOTTLES DRAWN AEROBIC ONLY 2CC   Final   Culture  Setup Time     Final   Value: 04/04/2013 00:16     Performed at [facility]   Culture     Final   Value: GROUP B STREP(S.AGALACTIAE)ISOLATED     Note: Gram Stain Report Called to,Read Back By and Verified With: Misty Stanley BECK @ 1029 04/04/13 BY KRAWS     Performed at [facility]   Report Status 04/07/2013 FINAL   Final   Organism ID, Bacteria GROUP B STREP(S.AGALACTIAE)ISOLATED   Final  CULTURE, BLOOD (ROUTINE X 2)     Status: None   Collection Time    04/03/13  4:53 PM      Result Value Range Status   Specimen Description BLOOD RIGHT FOREARM   Final   Special Requests BOTTLES DRAWN AEROBIC ONLY 2CC   Final   Culture  Setup Time     Final   Value: 04/04/2013 00:16     Performed at [facility]   Culture     Final   Value:        BLOOD CULTURE RECEIVED NO GROWTH TO DATE CULTURE WILL BE HELD FOR 5 DAYS BEFORE ISSUING A FINAL NEGATIVE  REPORT     Performed at [facility]   Report Status PENDING   Incomplete  CULTURE, BLOOD (ROUTINE X 2)     Status: None   Collection Time    04/03/13  8:37 PM      Result Value Range Status   Specimen Description BLOOD ARM RIGHT   Final   Special Requests BOTTLES DRAWN AEROBIC ONLY 1CC   Final   Culture  Setup Time     Final   Value: 04/04/2013 00:43     Performed at [facility]   Culture     Final   Value:  BLOOD CULTURE RECEIVED NO GROWTH TO DATE CULTURE WILL BE HELD FOR 5 DAYS BEFORE ISSUING A FINAL NEGATIVE REPORT     Performed at [facility]   Report Status PENDING   Incomplete  CULTURE, BLOOD (ROUTINE X 2)     Status: None   Collection Time    04/03/13  8:45 PM      Result Value Range Status   Specimen Description BLOOD HAND RIGHT   Final   Special Requests BOTTLES DRAWN AEROBIC ONLY 1CC   Final   Culture  Setup Time     Final   Value: 04/04/2013 00:42     Performed at [facility]   Culture     Final   Value:        BLOOD CULTURE RECEIVED NO GROWTH TO DATE CULTURE WILL BE HELD FOR 5 DAYS BEFORE ISSUING A FINAL NEGATIVE REPORT     Performed at [facility]   Report Status PENDING   Incomplete  MRSA PCR SCREENING     Status: None   Collection Time    04/03/13 10:24 PM      Result Value Range Status   MRSA by PCR NEGATIVE  NEGATIVE Final   Comment:            The GeneXpert MRSA Assay (FDA     approved for NASAL specimens     only), is one component of a     comprehensive MRSA colonization     surveillance program. It is not     intended to diagnose MRSA     infection nor to guide or     monitor treatment for     MRSA infections.  URINE CULTURE     Status: None   Collection Time    04/03/13 10:41 PM      Result Value Range Status   Specimen Description URINE, CATHETERIZED   Final   Special Requests NONE   Final   Culture  Setup Time     Final   Value: 04/04/2013 00:55     Performed at [facility]    Colony Count     Final   Value: NO GROWTH     Performed at [facility]   Culture     Final   Value: NO GROWTH     Performed at [facility]   Report Status 04/05/2013 FINAL   Final     Labs: Basic Metabolic Panel:  Recent Labs Lab 04/03/13 2039 04/04/13 0355 04/05/13 0418 04/06/13 0621 04/07/13 0319 04/09/13 0415  NA 142 142 137 137 135 135  K 3.3* 4.0 3.3* 3.9 3.6 3.7  CL 84* 87* 85* 87* 86* 79*  CO2 >45* >45* >45* 40* 44* >45*  GLUCOSE 141* 147* 135* 149* 161* 114*  BUN 36* 34* 25* 30* 32* 49*  CREATININE 0.82 0.75 0.73 0.72 0.79 1.12*  CALCIUM 9.8 10.2 10.3 10.1 9.6 9.4  MG 1.4* 1.5 1.9  --   --   --   PHOS 2.2* 2.0* 3.2  --   --   --    Liver Function Tests:  Recent Labs Lab 04/03/13 1655 04/03/13 2039 04/09/13 0415  AST 35 34 24  ALT 18 17 23   ALKPHOS 70 60 45  BILITOT 0.3 0.3 0.2*  PROT 8.2 7.7 6.8  ALBUMIN 3.9 3.5 3.1*   No results found for this basename: LIPASE, AMYLASE,  in the last 168 hours No results found for this basename:  AMMONIA,  in the last 168 hours CBC:  Recent Labs Lab 04/03/13 1655 04/03/13 2039 04/04/13 0355 04/04/13 1154 04/05/13 0730 04/07/13 0319 04/09/13 0415  WBC 17.1* 22.5* 28.2* 25.7* 22.9* 11.8* 9.7  NEUTROABS 15.8* 21.1*  --  24.2* 21.4*  --   --   HGB 11.0* 10.8* 10.8* 10.3* 10.7* 11.3* 10.8*  HCT 35.1* 33.5* 33.3* 32.4* 32.5* 33.3* 32.8*  MCV 92.1 92.3 91.2 91.3 90.5 87.2 88.2  PLT 186 177 167 178 189 228 245   Cardiac Enzymes:  Recent Labs Lab 04/03/13 1655 04/03/13 2040 04/04/13 0110 04/04/13 0716 04/07/13 0319  TROPONINI <0.30 <0.30 <0.30 <0.30 <0.30   BNP: BNP (last 3 results)  Recent Labs  11/05/12 1800 01/19/13 0231 04/03/13 2040  PROBNP 332.1* 1633.0* 387.1*   CBG:  Recent Labs Lab 04/08/13 0725 04/08/13 1157 04/08/13 1604 04/08/13 2025 04/09/13 0753  GLUCAP 135* 169* 129* 144* 88       Signed:  Angel Weedon  [facility] 04/09/2013, 10:57  AM

## 2013-04-10 ENCOUNTER — Other Ambulatory Visit: Payer: Self-pay | Admitting: *Deleted

## 2013-04-10 LAB — CULTURE, BLOOD (ROUTINE X 2): Culture: NO GROWTH

## 2013-04-10 MED ORDER — OXYCODONE-ACETAMINOPHEN 5-325 MG PO TABS: ORAL_TABLET | ORAL | Status: DC

## 2013-04-15 ENCOUNTER — Non-Acute Institutional Stay (SKILLED_NURSING_FACILITY): Payer: Medicaid Other | Admitting: Internal Medicine

## 2013-04-15 DIAGNOSIS — F319 Bipolar disorder, unspecified: Secondary | ICD-10-CM

## 2013-04-15 DIAGNOSIS — J449 Chronic obstructive pulmonary disease, unspecified: Secondary | ICD-10-CM

## 2013-04-15 DIAGNOSIS — I10 Essential (primary) hypertension: Secondary | ICD-10-CM

## 2013-04-15 DIAGNOSIS — E1149 Type 2 diabetes mellitus with other diabetic neurological complication: Secondary | ICD-10-CM

## 2013-04-17 ENCOUNTER — Ambulatory Visit
Admission: RE | Admit: 2013-04-17 | Discharge: 2013-04-17 | Disposition: A | Discharge status: Home / Self Care | Payer: Medicaid Other | Source: Ambulatory Visit | Attending: Internal Medicine | Admitting: Internal Medicine

## 2013-04-17 DIAGNOSIS — Z1231 Encounter for screening mammogram for malignant neoplasm of breast: Secondary | ICD-10-CM

## 2013-04-23 ENCOUNTER — Non-Acute Institutional Stay (SKILLED_NURSING_FACILITY): Payer: Medicaid Other | Admitting: Internal Medicine

## 2013-04-23 DIAGNOSIS — D638 Anemia in other chronic diseases classified elsewhere: Secondary | ICD-10-CM

## 2013-04-24 NOTE — Progress Notes (Signed)
PROGRESS NOTE  DATE: 04/23/2013  FACILITY:  [facility]  LEVEL OF CARE: SNF (31)  Acute Visit  CHIEF COMPLAINT:  Manage anemia of chronic disease  HISTORY OF PRESENT ILLNESS: I was requested by the staff to assess the patient regarding above problem(s):  ANEMIA: The anemia is unstable. The patient denies melena or hematochezia. No complications from the medications currently being used.  04-23-13 hemoglobin 10.2, MCV 89. In 7-14 hemoglobin 11.9.  PAST MEDICAL HISTORY : Reviewed.  No changes.  CURRENT MEDICATIONS: Reviewed per [facility]  PHYSICAL EXAMINATION  GENERAL: no acute distress, morbidly body habitus RESPIRATORY: breathing is even & unlabored, BS CTAB CARDIAC: RRR, no murmur,no extra heart sounds, +1 bilateral edema LABS/RADIOLOGY:  ASSESSMENT/PLAN:  Anemia of chronic disease-hemoglobin unstable. Will monitor.  CPT CODE: 78295

## 2013-04-30 ENCOUNTER — Encounter (HOSPITAL_COMMUNITY): Payer: Self-pay | Admitting: Emergency Medicine

## 2013-04-30 ENCOUNTER — Emergency Department (HOSPITAL_COMMUNITY): Payer: Medicaid Other

## 2013-04-30 ENCOUNTER — Non-Acute Institutional Stay (SKILLED_NURSING_FACILITY): Payer: Medicaid Other | Admitting: Internal Medicine

## 2013-04-30 ENCOUNTER — Inpatient Hospital Stay (HOSPITAL_COMMUNITY)
Admission: EM | Admit: 2013-04-30 | Discharge: 2013-05-09 | DRG: 004 | Disposition: A | Discharge status: Skilled Nursing Facility | Payer: Medicaid Other | Attending: Pulmonary Disease | Admitting: Pulmonary Disease

## 2013-04-30 DIAGNOSIS — N179 Acute kidney failure, unspecified: Secondary | ICD-10-CM

## 2013-04-30 DIAGNOSIS — I2510 Atherosclerotic heart disease of native coronary artery without angina pectoris: Secondary | ICD-10-CM | POA: Diagnosis present

## 2013-04-30 DIAGNOSIS — Z7982 Long term (current) use of aspirin: Secondary | ICD-10-CM

## 2013-04-30 DIAGNOSIS — G9340 Encephalopathy, unspecified: Secondary | ICD-10-CM | POA: Diagnosis present

## 2013-04-30 DIAGNOSIS — R42 Dizziness and giddiness: Secondary | ICD-10-CM

## 2013-04-30 DIAGNOSIS — F319 Bipolar disorder, unspecified: Secondary | ICD-10-CM

## 2013-04-30 DIAGNOSIS — Z79899 Other long term (current) drug therapy: Secondary | ICD-10-CM

## 2013-04-30 DIAGNOSIS — K219 Gastro-esophageal reflux disease without esophagitis: Secondary | ICD-10-CM

## 2013-04-30 DIAGNOSIS — J9620 Acute and chronic respiratory failure, unspecified whether with hypoxia or hypercapnia: Principal | ICD-10-CM

## 2013-04-30 DIAGNOSIS — Z880 Allergy status to penicillin: Secondary | ICD-10-CM

## 2013-04-30 DIAGNOSIS — J4489 Other specified chronic obstructive pulmonary disease: Secondary | ICD-10-CM

## 2013-04-30 DIAGNOSIS — J9610 Chronic respiratory failure, unspecified whether with hypoxia or hypercapnia: Secondary | ICD-10-CM

## 2013-04-30 DIAGNOSIS — R7881 Bacteremia: Secondary | ICD-10-CM

## 2013-04-30 DIAGNOSIS — R0600 Dyspnea, unspecified: Secondary | ICD-10-CM

## 2013-04-30 DIAGNOSIS — I872 Venous insufficiency (chronic) (peripheral): Secondary | ICD-10-CM | POA: Diagnosis present

## 2013-04-30 DIAGNOSIS — G4733 Obstructive sleep apnea (adult) (pediatric): Secondary | ICD-10-CM

## 2013-04-30 DIAGNOSIS — I959 Hypotension, unspecified: Secondary | ICD-10-CM

## 2013-04-30 DIAGNOSIS — E1149 Type 2 diabetes mellitus with other diabetic neurological complication: Secondary | ICD-10-CM

## 2013-04-30 DIAGNOSIS — J449 Chronic obstructive pulmonary disease, unspecified: Secondary | ICD-10-CM

## 2013-04-30 DIAGNOSIS — I5031 Acute diastolic (congestive) heart failure: Secondary | ICD-10-CM

## 2013-04-30 DIAGNOSIS — D638 Anemia in other chronic diseases classified elsewhere: Secondary | ICD-10-CM

## 2013-04-30 DIAGNOSIS — I890 Lymphedema, not elsewhere classified: Secondary | ICD-10-CM

## 2013-04-30 DIAGNOSIS — I252 Old myocardial infarction: Secondary | ICD-10-CM

## 2013-04-30 DIAGNOSIS — E119 Type 2 diabetes mellitus without complications: Secondary | ICD-10-CM | POA: Diagnosis present

## 2013-04-30 DIAGNOSIS — J189 Pneumonia, unspecified organism: Secondary | ICD-10-CM | POA: Diagnosis present

## 2013-04-30 DIAGNOSIS — E861 Hypovolemia: Secondary | ICD-10-CM

## 2013-04-30 DIAGNOSIS — R0609 Other forms of dyspnea: Secondary | ICD-10-CM

## 2013-04-30 DIAGNOSIS — I739 Peripheral vascular disease, unspecified: Secondary | ICD-10-CM | POA: Diagnosis present

## 2013-04-30 DIAGNOSIS — Z6841 Body Mass Index (BMI) 40.0 and over, adult: Secondary | ICD-10-CM

## 2013-04-30 DIAGNOSIS — E662 Morbid (severe) obesity with alveolar hypoventilation: Secondary | ICD-10-CM | POA: Diagnosis present

## 2013-04-30 DIAGNOSIS — R079 Chest pain, unspecified: Secondary | ICD-10-CM

## 2013-04-30 DIAGNOSIS — R1310 Dysphagia, unspecified: Secondary | ICD-10-CM

## 2013-04-30 DIAGNOSIS — E876 Hypokalemia: Secondary | ICD-10-CM | POA: Diagnosis present

## 2013-04-30 DIAGNOSIS — I10 Essential (primary) hypertension: Secondary | ICD-10-CM

## 2013-04-30 DIAGNOSIS — Z781 Physical restraint status: Secondary | ICD-10-CM | POA: Diagnosis not present

## 2013-04-30 DIAGNOSIS — J309 Allergic rhinitis, unspecified: Secondary | ICD-10-CM

## 2013-04-30 LAB — POCT I-STAT 3, ART BLOOD GAS (G3+)
Acid-Base Excess: 30 mmol/L — ABNORMAL HIGH (ref 0.0–2.0)
Acid-Base Excess: 28 mmol/L — ABNORMAL HIGH (ref 0.0–2.0)
Bicarbonate: 58.2 mEq/L — ABNORMAL HIGH (ref 20.0–24.0)
Bicarbonate: 51.8 mEq/L — ABNORMAL HIGH (ref 20.0–24.0)
O2 Saturation: 93 %
O2 Saturation: 96 %
O2 Saturation: 99 %
Patient temperature: 99.3
TCO2: >50 mmol/L (ref 0–100)
TCO2: >50 mmol/L (ref 0–100)
pCO2 arterial: 90.9 mmHg (ref 35.0–45.0)
pCO2 arterial: 90.9 mmHg (ref 35.0–45.0)
pCO2 arterial: 68.4 mmHg (ref 35.0–45.0)
pH, Arterial: 7.43 (ref 7.350–7.450)
pH, Arterial: 7.414 (ref 7.350–7.450)
pH, Arterial: 7.489 — ABNORMAL HIGH (ref 7.350–7.450)
pO2, Arterial: 72 mmHg — ABNORMAL LOW (ref 80.0–100.0)
pO2, Arterial: 117 mmHg — ABNORMAL HIGH (ref 80.0–100.0)

## 2013-04-30 LAB — CG4 I-STAT (LACTIC ACID): Lactic Acid, Venous: 1.4 mmol/L (ref 0.5–2.2)

## 2013-04-30 LAB — CBC WITH DIFFERENTIAL/PLATELET
Basophils Absolute: 0 10*3/uL (ref 0.0–0.1)
Basophils Relative: 0 % (ref 0–1)
Eosinophils Absolute: 0 10*3/uL (ref 0.0–0.7)
HCT: 29.1 % — ABNORMAL LOW (ref 36.0–46.0)
Lymphs Abs: 2.5 10*3/uL (ref 0.7–4.0)
MCHC: 33 g/dL (ref 30.0–36.0)
Monocytes Absolute: 0.8 10*3/uL (ref 0.1–1.0)
Monocytes Relative: 7 % (ref 3–12)
Neutro Abs: 8.2 10*3/uL — ABNORMAL HIGH (ref 1.7–7.7)
Neutrophils Relative %: 72 % (ref 43–77)
Platelets: 245 10*3/uL (ref 150–400)
RBC: 3.17 MIL/uL — ABNORMAL LOW (ref 3.87–5.11)
RDW: 13.9 % (ref 11.5–15.5)

## 2013-04-30 LAB — COMPREHENSIVE METABOLIC PANEL
AST: 31 U/L (ref 0–37)
Albumin: 3.7 g/dL (ref 3.5–5.2)
Alkaline Phosphatase: 54 U/L (ref 39–117)
BUN: 28 mg/dL — ABNORMAL HIGH (ref 6–23)
Calcium: 10.4 mg/dL (ref 8.4–10.5)
Chloride: 80 mEq/L — ABNORMAL LOW (ref 96–112)
Glucose, Bld: 145 mg/dL — ABNORMAL HIGH (ref 70–99)
Potassium: 3.1 mEq/L — ABNORMAL LOW (ref 3.5–5.1)
Total Bilirubin: 0.3 mg/dL (ref 0.3–1.2)
Total Protein: 7.5 g/dL (ref 6.0–8.3)

## 2013-04-30 LAB — GLUCOSE, CAPILLARY: Glucose-Capillary: 149 mg/dL — ABNORMAL HIGH (ref 70–99)

## 2013-04-30 LAB — URINE MICROSCOPIC-ADD ON

## 2013-04-30 LAB — URINALYSIS, ROUTINE W REFLEX MICROSCOPIC
Bilirubin Urine: NEGATIVE
Hgb urine dipstick: NEGATIVE
Ketones, ur: NEGATIVE mg/dL
Nitrite: NEGATIVE
Specific Gravity, Urine: 1.016 (ref 1.005–1.030)
Urobilinogen, UA: 0.2 mg/dL (ref 0.0–1.0)
pH: 7 (ref 5.0–8.0)

## 2013-04-30 LAB — TROPONIN I: Troponin I: <0.3 ng/mL (ref <0.30)

## 2013-04-30 MED ORDER — HEPARIN SODIUM (PORCINE) 5000 UNIT/ML IJ SOLN
5000.0000 [IU] | Freq: Three times a day (TID) | INTRAMUSCULAR | Status: DC
  Administered 2013-04-30 – 2013-05-09 (×26): 5000 [IU] via SUBCUTANEOUS
  Filled 2013-04-30 (×29): qty 1

## 2013-04-30 MED ORDER — VANCOMYCIN HCL 10 G IV SOLR
2000.0000 mg | Freq: Once | INTRAVENOUS | Status: AC
  Administered 2013-04-30: 2000 mg via INTRAVENOUS
  Filled 2013-04-30: qty 2000

## 2013-04-30 MED ORDER — PROPOFOL 10 MG/ML IV EMUL
5.0000 ug/kg/min | INTRAVENOUS | Status: DC
  Administered 2013-04-30: 10 ug/kg/min via INTRAVENOUS
  Administered 2013-05-01: 15 ug/kg/min via INTRAVENOUS
  Administered 2013-05-01 – 2013-05-02 (×2): 20 ug/kg/min via INTRAVENOUS
  Administered 2013-05-02: 10 ug/kg/min via INTRAVENOUS
  Administered 2013-05-03: 20 ug/kg/min via INTRAVENOUS
  Filled 2013-04-30 (×7): qty 100

## 2013-04-30 MED ORDER — ALBUTEROL SULFATE HFA 108 (90 BASE) MCG/ACT IN AERS
6.0000 | INHALATION_SPRAY | RESPIRATORY_TRACT | Status: DC
  Administered 2013-05-01 – 2013-05-04 (×24): 6 via RESPIRATORY_TRACT
  Filled 2013-04-30 (×3): qty 6.7

## 2013-04-30 MED ORDER — POTASSIUM CHLORIDE 10 MEQ/50ML IV SOLN
10.0000 meq | INTRAVENOUS | Status: AC
  Administered 2013-04-30 – 2013-05-01 (×4): 10 meq via INTRAVENOUS
  Filled 2013-04-30 (×4): qty 50

## 2013-04-30 MED ORDER — MIDAZOLAM HCL 2 MG/2ML IJ SOLN
INTRAMUSCULAR | Status: AC
  Administered 2013-04-30: 2 mg
  Filled 2013-04-30: qty 2

## 2013-04-30 MED ORDER — BIOTENE DRY MOUTH MT LIQD
1.0000 "application " | Freq: Four times a day (QID) | OROMUCOSAL | Status: DC
  Administered 2013-05-01 – 2013-05-07 (×27): 15 mL via OROMUCOSAL

## 2013-04-30 MED ORDER — ALBUTEROL SULFATE HFA 108 (90 BASE) MCG/ACT IN AERS: 6.0000 | INHALATION_SPRAY | RESPIRATORY_TRACT | Status: DC | PRN

## 2013-04-30 MED ORDER — FENTANYL CITRATE 0.05 MG/ML IJ SOLN
INTRAMUSCULAR | Status: AC
  Administered 2013-04-30: 100 ug
  Filled 2013-04-30: qty 2

## 2013-04-30 MED ORDER — SODIUM CHLORIDE 0.9 % IV BOLUS (SEPSIS)
1000.0000 mL | Freq: Once | INTRAVENOUS | Status: AC
  Administered 2013-04-30: 1000 mL via INTRAVENOUS

## 2013-04-30 MED ORDER — DEXTROSE 5 % IV SOLN
1.0000 g | Freq: Three times a day (TID) | INTRAVENOUS | Status: DC
  Administered 2013-05-01 – 2013-05-07 (×20): 1 g via INTRAVENOUS
  Filled 2013-04-30 (×22): qty 1

## 2013-04-30 MED ORDER — CHLORHEXIDINE GLUCONATE 0.12 % MT SOLN
15.0000 mL | Freq: Two times a day (BID) | OROMUCOSAL | Status: DC
  Administered 2013-04-30 – 2013-05-01 (×2): 15 mL via OROMUCOSAL
  Filled 2013-04-30 (×2): qty 15

## 2013-04-30 MED ORDER — INSULIN ASPART 100 UNIT/ML ~~LOC~~ SOLN
0.0000 [IU] | SUBCUTANEOUS | Status: DC
  Administered 2013-04-30 – 2013-05-06 (×11): 2 [IU] via SUBCUTANEOUS

## 2013-04-30 MED ORDER — SODIUM CHLORIDE 0.9 % IV BOLUS (SEPSIS)
500.0000 mL | Freq: Once | INTRAVENOUS | Status: AC
  Administered 2013-04-30: 500 mL via INTRAVENOUS

## 2013-04-30 MED ORDER — PANTOPRAZOLE SODIUM 40 MG IV SOLR
40.0000 mg | INTRAVENOUS | Status: DC
  Administered 2013-04-30 – 2013-05-06 (×7): 40 mg via INTRAVENOUS
  Filled 2013-04-30 (×9): qty 40

## 2013-04-30 MED ORDER — PHENYLEPHRINE HCL 10 MG/ML IJ SOLN
30.0000 ug/min | INTRAVENOUS | Status: DC
  Filled 2013-04-30: qty 1

## 2013-04-30 MED ORDER — VANCOMYCIN HCL IN DEXTROSE 750-5 MG/150ML-% IV SOLN
750.0000 mg | Freq: Two times a day (BID) | INTRAVENOUS | Status: DC
Start: 2013-05-01 — End: 2013-05-02
  Administered 2013-05-01 – 2013-05-02 (×3): 750 mg via INTRAVENOUS
  Filled 2013-04-30 (×5): qty 150

## 2013-04-30 MED ORDER — SODIUM CHLORIDE 0.9 % IV SOLN
INTRAVENOUS | Status: DC
  Administered 2013-04-30: 23:00:00 via INTRAVENOUS

## 2013-04-30 MED ORDER — AZTREONAM 2 G IJ SOLR
2.0000 g | Freq: Once | INTRAMUSCULAR | Status: AC
  Administered 2013-04-30: 2 g via INTRAVENOUS
  Filled 2013-04-30: qty 2

## 2013-04-30 MED ORDER — IPRATROPIUM BROMIDE HFA 17 MCG/ACT IN AERS
6.0000 | INHALATION_SPRAY | RESPIRATORY_TRACT | Status: DC
  Administered 2013-05-01 – 2013-05-04 (×24): 6 via RESPIRATORY_TRACT
  Filled 2013-04-30: qty 12.9

## 2013-04-30 NOTE — Progress Notes (Signed)
Changes made to BIPAP settings to increase tidal volumes.

## 2013-04-30 NOTE — ED Provider Notes (Signed)
CSN: [id number]     Arrival date & time 04/30/13  1532 History   First MD Initiated Contact with Patient 04/30/13 1539     No chief complaint on file.  The history is provided by the EMS personnel and medical records. No language interpreter was used.    [name] is a 62 year old African American female with past medical history of COPD and obstructive sleep apnea who comes to the emergency department today with altered mental status and shortness of breath. She was admitted to the hospital approximately a week and a half ago for similar complaint. At that time she was found to be in hypercarbic respiratory failure. She required BiPAP which improved her mental status. She's been using her BiPAP at her nursing home at nights as she normally does. Today she was difficult to arouse distal nipple grove contacted EMS. They reported a temperature of 101.3 orally. They administered 650 mg of Tylenol. When EMS arrived on scene [name] is satting 96% on room air he started her on 100% nonrebreather. She was responsive to voice and sternal rub.    Past Medical History  Diagnosis Date  . COPD   . Hypertension   . Chronic respiratory failure     3 L oxygen  . GERD (gastroesophageal reflux disease)   . Diabetes mellitus   . Obesity, Class III, BMI 40-49.9 (morbid obesity)   . Obstructive sleep apnea on CPAP   . Peripheral vascular disease   . Bipolar disease, chronic   . Lymphedema   . COPD (chronic obstructive pulmonary disease)   . Cellulitis   . Coronary artery disease   . Asthma   . Myocardial infarction   . Vertigo    Past Surgical History  Procedure Laterality Date  . Abdominal hysterectomy    . Cholecystectomy    . Appendectomy    . Tubal ligation     Family History  Problem Relation Age of Onset  . Prostate cancer     History  Substance Use Topics  . Smoking status: Never Smoker   . Smokeless tobacco: Never Used  . Alcohol Use: No   OB History   Grav Para Term  Preterm Abortions TAB SAB Ect Mult Living                 Review of Systems  Unable to perform ROS: Mental status change    Allergies  Penicillins; Aspirin; Bee venom; Lisinopril; Nyquil cold &; and Ppd  Home Medications   Current Outpatient Rx  Name  Route  Sig  Dispense  Refill  . albuterol (PROVENTIL) (2.5 MG/3ML) 0.083% nebulizer solution   Nebulization   Take 2.5 mg by nebulization every 6 (six) hours as needed for wheezing.         Marland Kitchen amLODipine (NORVASC) 5 MG tablet   Oral   Take 5 mg by mouth daily.         . ARIPiprazole (ABILIFY) 10 MG tablet   Oral   Take 10 mg by mouth daily. For depressive psychosis         . ascorbic acid (VITAMIN C) 500 MG tablet   Oral   Take 500 mg by mouth 2 (two) times daily.         . budesonide-formoterol (SYMBICORT) 160-4.5 MCG/ACT inhaler   Inhalation   Inhale 2 puffs into the lungs 2 (two) times daily.         . busPIRone (BUSPAR) 10 MG tablet   Oral  Take 10 mg by mouth 2 (two) times daily.         . cloNIDine (CATAPRES) 0.2 MG tablet   Oral   Take 0.2 mg by mouth 2 (two) times daily.         . divalproex (DEPAKOTE ER) 250 MG 24 hr tablet   Oral   Take 250 mg by mouth 2 (two) times daily.         Marland Kitchen docusate sodium (COLACE) 100 MG capsule   Oral   Take 100 mg by mouth daily.         . DULoxetine (CYMBALTA) 60 MG capsule   Oral   Take 60 mg by mouth 2 (two) times daily.         . famotidine (PEPCID) 20 MG tablet   Oral   Take 20 mg by mouth 2 (two) times daily.         . feeding supplement (PRO-STAT SUGAR FREE 64) LIQD   Oral   Take 30 mLs by mouth 2 (two) times daily.         . fluticasone (FLONASE) 50 MCG/ACT nasal spray   Nasal   Place 2 sprays into the nose daily.         . furosemide (LASIX) 40 MG tablet   Oral   Take 40 mg by mouth 2 (two) times daily.         Marland Kitchen gabapentin (NEURONTIN) 600 MG tablet   Oral   Take 600 mg by mouth 3 (three) times daily.         .  isosorbide mononitrate (IMDUR) 30 MG 24 hr tablet   Oral   Take 30 mg by mouth daily.         . metoCLOPramide (REGLAN) 5 MG tablet   Oral   Take 5 mg by mouth 3 (three) times daily.         . metolazone (ZAROXOLYN) 5 MG tablet   Oral   Take 5 mg by mouth every 3 (three) days.         . Multiple Vitamins-Minerals (CERTAGEN PO)   Oral   Take 1 tablet by mouth daily.         . nitroGLYCERIN (NITROSTAT) 0.4 MG SL tablet   Sublingual   Place 0.4 mg under the tongue every 5 (five) minutes as needed for chest pain.          Marland Kitchen oxyCODONE-acetaminophen (PERCOCET/ROXICET) 5-325 MG per tablet      Take one tablet by mouth every 8 hours as needed for pain   90 tablet   0   . senna (SENOKOT) 8.6 MG TABS   Oral   Take 2 tablets by mouth daily.         . simethicone (MYLICON) 80 MG chewable tablet   Oral   Chew 80 mg by mouth every 8 (eight) hours.         Marland Kitchen tiotropium (SPIRIVA) 18 MCG inhalation capsule   Inhalation   Place 18 mcg into inhaler and inhale daily.         . vitamin D, CHOLECALCIFEROL, 400 UNITS tablet   Oral   Take 400 Units by mouth daily.          BP 105/62  Pulse 66  Temp(Src) 99 F (37.2 C) (Core (Comment))  Resp 18  Ht 5' 4.17" (1.63 m)  Wt 291 lb 0.1 oz (132 kg)  BMI 49.68 kg/m2  SpO2 100%  Physical Exam  Nursing  note and vitals reviewed. Constitutional: She is oriented to person, place, and time. She appears well-developed and well-nourished. She appears lethargic. No distress.  HENT:  Head: Normocephalic and atraumatic.  Eyes: Pupils are equal, round, and reactive to light. Right pupil is round and reactive. Left pupil is round and reactive. Pupils are equal.  Neck: Normal range of motion.  Cardiovascular: Normal rate, regular rhythm, normal heart sounds and intact distal pulses.   Pulmonary/Chest: Effort normal. No respiratory distress. She has no wheezes. She exhibits no tenderness.  Abdominal: Soft. Bowel sounds are normal.  She exhibits no distension. There is no tenderness. There is no rebound and no guarding.  Neurological: She is oriented to person, place, and time. She has normal strength. She appears lethargic. No sensory deficit. GCS eye subscore is 3. GCS verbal subscore is 4. GCS motor subscore is 5.  Skin: Skin is warm and dry.    ED Course  Procedures (including critical care time) Labs Review Labs Reviewed  GLUCOSE, CAPILLARY - Abnormal; Notable for the following:    Glucose-Capillary 149 (*)    All other components within normal limits  CBC WITH DIFFERENTIAL - Abnormal; Notable for the following:    WBC 11.5 (*)    RBC 3.17 (*)    Hemoglobin 9.6 (*)    HCT 29.1 (*)    Neutro Abs 8.2 (*)    All other components within normal limits  COMPREHENSIVE METABOLIC PANEL - Abnormal; Notable for the following:    Potassium 3.1 (*)    Chloride 80 (*)    CO2 >45 (*)    Glucose, Bld 145 (*)    BUN 28 (*)    Creatinine, Ser 1.21 (*)    GFR calc non Af Amer 47 (*)    GFR calc Af Amer 54 (*)    All other components within normal limits  URINALYSIS, ROUTINE W REFLEX MICROSCOPIC - Abnormal; Notable for the following:    Protein, ur 30 (*)    Leukocytes, UA SMALL (*)    All other components within normal limits  URINE MICROSCOPIC-ADD ON - Abnormal; Notable for the following:    Squamous Epithelial / LPF FEW (*)    All other components within normal limits  POCT I-STAT 3, BLOOD GAS (G3+) - Abnormal; Notable for the following:    pCO2 arterial 90.9 (*)    pO2, Arterial 72.0 (*)    Bicarbonate 60.3 (*)    Acid-Base Excess 30.0 (*)    All other components within normal limits  POCT I-STAT 3, BLOOD GAS (G3+) - Abnormal; Notable for the following:    pCO2 arterial 90.9 (*)    Bicarbonate 58.2 (*)    Acid-Base Excess 28.0 (*)    All other components within normal limits  POCT I-STAT 3, BLOOD GAS (G3+) - Abnormal; Notable for the following:    pH, Arterial 7.489 (*)    pCO2 arterial 68.4 (*)    pO2,  Arterial 117.0 (*)    Bicarbonate 51.8 (*)    Acid-Base Excess 25.0 (*)    All other components within normal limits  CULTURE, BLOOD (ROUTINE X 2)  CULTURE, BLOOD (ROUTINE X 2)  URINE CULTURE  CULTURE, RESPIRATORY (NON-EXPECTORATED)  TROPONIN I  CBC  BASIC METABOLIC PANEL  PHOSPHORUS  MAGNESIUM  PROCALCITONIN  PROCALCITONIN  BLOOD GAS, ARTERIAL  CG4 I-STAT (LACTIC ACID)   Imaging Review Dg Chest Portable 1 View  04/30/2013   CLINICAL DATA:  hypoxia  EXAM: PORTABLE CHEST - 1 VIEW  COMPARISON:  None.  FINDINGS: Endotracheal tube tip is 2.9 cm above the carinal. Central catheter tip is in the right atrium. No pneumothorax.  There is no edema or consolidation. Heart is mildly enlarged with normal pulmonary vascularity. No adenopathy. There is eventration of the right hemidiaphragm.  IMPRESSION: Tube and catheter positions as described. No pneumothorax. No edema or consolidation.   Electronically Signed   By: Bretta Bang M.D.   On: 04/30/2013 21:20   Dg Chest Port 1 View  04/30/2013   CLINICAL DATA:  Shortness of breath and altered mental status  EXAM: PORTABLE CHEST - 1 VIEW  COMPARISON:  April 06, 2013  FINDINGS: There is no edema or consolidation. Heart is mildly enlarged with normal pulmonary vascularity. No adenopathy. No bone lesions.  IMPRESSION: No edema or consolidation.   Electronically Signed   By: Bretta Bang M.D.   On: 04/30/2013 16:34    EKG Interpretation   None       MDM  [name] is a 62 year old American female with past medical history of obstructive sleep apnea and COPD who comes to the emergency department today with altered mental status. Physical exam as above. Upon initial dilation she was protecting her airway. She was arousable to sternal rub.  She was not felt to require intubation upon initial evaluation.  With her altered mental status and history of COPD physical to require BiPAP for CO2. She was placed on BiPAP and was initially tolerating  it well. Initial workup included a VBG, UA, troponin, lactic acid, blood cultures, CBC, CMP, glucose, a chest x-ray, and EKG.  Chest x-ray demonstrated no consolidations and no cardiopulmonary abnormalities as result doubt pneumonia or pneumothorax.  CMP Creatinine was 1.2 at patients baseline, CO2 is greater than 45, and potassium was 3.1 otherwise unremarkable. CBC with a WBC of 11.5 and hemoglobin of 9.6. Otherwise unremarkable.  Hemoglobin is close to patient's baseline.  I-STAT VBG had a PCO2 of 86.8, bicarbonate of 62.4. With significantly elevated CO2 is likely the cause of the patient's altered mental status. I-STAT lactic acid was 1.4. Troponin was negative. As result doubt an MI. UA was unremarkable. As result doubt urinary tract infection. [name] was reevaluated at approximately an hour and a half on BiPAP. Her mental status had improved. She was more easily aroused.  VBG demonstrated a PCO2 of 57.6 a pH of 7.654. With improving mental status shows to require intubation at this time. However with significantly elevated CO2 she was felt to require admission to the ICU for hypercarbic respiratory failure. Critical care team was counseled. They vary [name] the emergency department. [name] was admitted to the MICU in stable condition. Labs and imaging reviewed by myself and considered and medical decision-making. Imaging was interpreted by radiology. Care was discussed with my attending Dr. Denton Lank.  1. Hypotension   2. Acute and chronic respiratory failure (acute-on-chronic)   3. AKI (acute kidney injury)   4. Anemia of other chronic disease   5. Hypovolemia   6. Morbid obesity         Bethann Berkshire, MD 04/30/13 2253

## 2013-04-30 NOTE — ED Notes (Signed)
RT at bedside, ABG drawn. 

## 2013-04-30 NOTE — ED Notes (Signed)
Pt from [facility] NH--staff reports lower level of consciousness, fever, respirations in 30s and just recently discharge one week ago where she was on ventilator for respiratory distress.  Pt original sats were 96% and fire placed her on 1005 NRB for comfort.  CBG 173

## 2013-04-30 NOTE — Progress Notes (Signed)
Patient transported on vent to room 2H13 without incident.

## 2013-04-30 NOTE — Procedures (Signed)
Name:  [name] MRN:  [id number] DOB:  [birth year]  PROCEDURE NOTE  Procedure:  Central venous catheter placement.  Indications:  Need for intravenous access and hemodynamic monitoring.  Consent:  Consent was implied due to the emergency nature of the procedure.  Anesthesia:  A total of 10 mL of 1% Lidocaine was used for local infiltration anesthesia.  Procedure summary:  Appropriate equipment was assembled.  The patient was identified as [name] and safety timeout was performed. The patient was placed in Trendelenburg position.  Sterile technique was used. The patient's left anterior chest wall was prepped using chlorhexidine / alcohol scrub and the field was draped in usual sterile fashion with full body drape. After the adequate anesthesia was achieved, the left subclavian vein was cannulated with the introducer needle without difficulty. A guide wire was advanced through the introducer needle, which was then withdrawn. A small skin incision was made at the point of wire entry, the dilator was inserted over the guide wire and appropriate dilation was obtained. The dilator was removed and triple-lumen catheter was advanced over the guide wire, which was then removed.  All ports were aspirated and flushed with normal saline without difficulty. The catheter was secured into place. Antibiotic patch was placed and sterile dressing was applied. Post-procedure chest x-ray was ordered.  Complications:  No immediate complications were noted.  Hemodynamic parameters and oxygenation remained stable throughout the procedure.  Estimated blood loss:  Less then 5 mL.  Lonia Farber, MD Pulmonary and Critical Care Medicine [facility] Cell: [phone]  04/30/2013, 9:29 PM

## 2013-04-30 NOTE — Progress Notes (Signed)
Name: [name] MRN: [id number] DOB: 1950-10-22   PROCEDURE NOTE  Procedure:  Endotracheal intubation.  Indication:  Acute respiratory failure  Consent:  Consent was implied due to the emergency nature of the procedure.  Anesthesia:  A total of 10 mg of Etomidate was given intravenously.  Procedure summary:  Appropriate equipment was assembled. The patient was identified as [name] and safety timeout was performed. The patient was placed supine, with head in sniffing position. After adequate level of anesthesia was achieved, a GS#4 blade was inserted into the oropharynx and the vocal cords were visualized. A 7.5 endotracheal tube was inserted with some difficulty due to (large tongue / uvula and small oral opening) and visualized going through the vocal cords. The stylette was removed and cuff inflated. Colorimetric change was noted on the CO2 meter. Breath sounds were heard over both lung fields equally. ETT was secured at 22 cm lip line.  Post procedure chest xray was ordered.  Complications:  No immediate complications were noted.  Hemodynamic parameters and oxygenation remained stable throughout the procedure.    Orlean Bradford, M.D. Pulmonary and Critical Care Medicine [facility] Pager: [phone]  04/30/2013, 9:22 PM

## 2013-04-30 NOTE — H&P (Signed)
PULMONARY  / CRITICAL CARE MEDICINE  Name: [name] MRN: [id number] DOB: [birth year]    ADMISSION DATE:  04/30/2013 CONSULTATION DATE:  04/30/2013  REFERRING MD :  EDP PRIMARY SERVICE:  PCCM  CHIEF COMPLAINT:  Acute encephalopathy  BRIEF PATIENT DESCRIPTION: 62 yo NH resident with COPD, OSA, OHS (on BiPAP / oxygen) and on multiple psychoactive Rx sent to [facility] ED with acute encephalopathy.  Intubated for airway protection.  SIGNIFICANT EVENTS / STUDIES:  10/28  Intubated for airway protection (dificult intubation - large tongue, small oropharynx, anterior larynx, morbid obesity)  LINES / TUBES: OETT 10/28 >>> OGT 10/28 >>> Foley 10/28 >>> L [place] TLC 10/28 >>>  CULTURES: 10/28  Blood >>> 10/28  Urine >>> 10/28  Resp >>>  ANTIBIOTICS: Vancomycin 10/28 >>> Aztreonam 10/28 >>>  The patient is encephalopathic and unable to provide history, which was obtained for available medical records.  HISTORY OF PRESENT ILLNESS:  62 yo NH resident with COPD, OSA, OHS (on BiPAP / oxygen) and on multiple psychoactive Rx sent to [facility] ED with acute encephalopathy.  Intubated for airway protection.  PAST MEDICAL HISTORY :  Past Medical History  Diagnosis Date  . COPD   . Hypertension   . Chronic respiratory failure     3 L oxygen  . GERD (gastroesophageal reflux disease)   . Diabetes mellitus   . Obesity, Class III, BMI 40-49.9 (morbid obesity)   . Obstructive sleep apnea on CPAP   . Peripheral vascular disease   . Bipolar disease, chronic   . Lymphedema   . COPD (chronic obstructive pulmonary disease)   . Cellulitis   . Coronary artery disease   . Asthma   . Myocardial infarction   . Vertigo    Past Surgical History  Procedure Laterality Date  . Abdominal hysterectomy    . Cholecystectomy    . Appendectomy    . Tubal ligation     Prior to Admission medications   Medication Sig Start Date End Date Taking? Authorizing Provider  albuterol (PROVENTIL) (2.5 MG/3ML) 0.083%  nebulizer solution Take 2.5 mg by nebulization every 6 (six) hours as needed for wheezing.   Yes Historical Provider, MD  amLODipine (NORVASC) 5 MG tablet Take 5 mg by mouth daily.   Yes Historical Provider, MD  ARIPiprazole (ABILIFY) 10 MG tablet Take 10 mg by mouth daily. For depressive psychosis   Yes Historical Provider, MD  ascorbic acid (VITAMIN C) 500 MG tablet Take 500 mg by mouth 2 (two) times daily.   Yes Historical Provider, MD  aspirin 325 MG tablet Take 325 mg by mouth daily.   Yes Historical Provider, MD  budesonide-formoterol (SYMBICORT) 160-4.5 MCG/ACT inhaler Inhale 2 puffs into the lungs 2 (two) times daily.   Yes Historical Provider, MD  busPIRone (BUSPAR) 10 MG tablet Take 10 mg by mouth 2 (two) times daily.   Yes Historical Provider, MD  calcium-vitamin D (OSCAL WITH D) 500-200 MG-UNIT per tablet Take 1 tablet by mouth 2 (two) times daily.   Yes Historical Provider, MD  clonazePAM (KLONOPIN) 0.5 MG tablet Take 0.5 mg by mouth daily.   Yes Historical Provider, MD  cloNIDine (CATAPRES) 0.2 MG tablet Take 0.2 mg by mouth 2 (two) times daily.   Yes Historical Provider, MD  divalproex (DEPAKOTE ER) 500 MG 24 hr tablet Take 500 mg by mouth 2 (two) times daily.   Yes Historical Provider, MD  docusate sodium (COLACE) 100 MG capsule Take 100 mg by mouth daily.  Yes Historical Provider, MD  DULoxetine (CYMBALTA) 60 MG capsule Take 60 mg by mouth 2 (two) times daily.   Yes Historical Provider, MD  feeding supplement (PRO-STAT SUGAR FREE 64) LIQD Take 30 mLs by mouth 2 (two) times daily.   Yes Historical Provider, MD  fluticasone (FLONASE) 50 MCG/ACT nasal spray Place 2 sprays into the nose daily.   Yes Historical Provider, MD  folic acid (FOLVITE) 1 MG tablet Take 1 mg by mouth daily.   Yes Historical Provider, MD  furosemide (LASIX) 40 MG tablet Take 40 mg by mouth daily.   Yes Historical Provider, MD  gabapentin (NEURONTIN) 400 MG capsule Take 400 mg by mouth 3 (three) times daily.    Yes Historical Provider, MD  isosorbide mononitrate (IMDUR) 30 MG 24 hr tablet Take 30 mg by mouth daily.   Yes Historical Provider, MD  lisinopril (PRINIVIL,ZESTRIL) 10 MG tablet Take 10 mg by mouth daily.   Yes Historical Provider, MD  metoCLOPramide (REGLAN) 5 MG tablet Take 5 mg by mouth 3 (three) times daily.   Yes Historical Provider, MD  morphine (MS CONTIN) 15 MG 12 hr tablet Take 15 mg by mouth 2 (two) times daily.   Yes Historical Provider, MD  Multiple Vitamins-Minerals (CERTAGEN PO) Take 1 tablet by mouth daily.   Yes Historical Provider, MD  nitroGLYCERIN (NITROSTAT) 0.4 MG SL tablet Place 0.4 mg under the tongue every 5 (five) minutes as needed for chest pain.    Yes Historical Provider, MD  Olopatadine HCl (PATADAY) 0.2 % SOLN Place 1 drop into both eyes daily.    Yes Historical Provider, MD  oxyCODONE-acetaminophen (PERCOCET) 5-325 MG per tablet Take 1 tablet by mouth every 4 (four) hours as needed. For pain   Yes Historical Provider, MD  pantoprazole (PROTONIX) 40 MG tablet Take 40 mg by mouth 2 (two) times daily.   Yes Historical Provider, MD  Propylene Glycol (SYSTANE BALANCE OP) Place 1 drop into both eyes 2 (two) times daily.   Yes Historical Provider, MD  senna (SENOKOT) 8.6 MG TABS Take 2 tablets by mouth daily.   Yes Historical Provider, MD  simethicone (MYLICON) 80 MG chewable tablet Chew 80 mg by mouth every 8 (eight) hours.   Yes Historical Provider, MD  tiotropium (SPIRIVA) 18 MCG inhalation capsule Place 18 mcg into inhaler and inhale daily.   Yes Historical Provider, MD  vitamin D, CHOLECALCIFEROL, 400 UNITS tablet Take 400 Units by mouth daily.   Yes Historical Provider, MD  zolpidem (AMBIEN) 5 MG tablet Take 5 mg by mouth at bedtime as needed. For sleep   Yes Historical Provider, MD  predniSONE (DELTASONE) 50 MG tablet Take 50 mg by mouth daily. Decrease by 10mg  daily until off of med    Historical Provider, MD   Allergies  Allergen Reactions  . Penicillins  Anaphylaxis    Tongue swelling  . Aspirin Other (See Comments)    Per MAR  . Bee Venom Other (See Comments)    Unknown  . Lisinopril     Per MAR  . Nyquil Cold & [Dm-Doxylamine-Acetaminophen] Other (See Comments)    Unknown   . Ppd [Tuberculin Purified Protein Derivative] Other (See Comments)    unknown    FAMILY HISTORY:  Family History  Problem Relation Age of Onset  . Prostate cancer     SOCIAL HISTORY:  reports that she has never smoked. She has never used smokeless tobacco. She reports that she does not drink alcohol or use illicit drugs.  REVIEW OF SYSTEMS:  Unable to provide.  INTERVAL HISTORY:  VITAL SIGNS: Temp:  [100.2 F (37.9 C)-100.8 F (38.2 C)] 100.8 F (38.2 C) (10/28 1915) Pulse Rate:  [45-117] 91 (10/28 1915) Resp:  [24-32] 26 (10/28 1915) BP: (128-174)/(61-93) 144/93 mmHg (10/28 1915) SpO2:  [97 %-100 %] 100 % (10/28 1915) FiO2 (%):  [40 %] 40 % (10/28 1915) Weight:  [132 kg (291 lb 0.1 oz)] 132 kg (291 lb 0.1 oz) (10/28 1554)  HEMODYNAMICS:   VENTILATOR SETTINGS: Vent Mode:  [-]  FiO2 (%):  [40 %] 40 % INTAKE / OUTPUT: Intake/Output     10/28 0701 - 10/29 0700   I.V. (mL/kg) 1000 (7.6)   Total Intake(mL/kg) 1000 (7.6)   Net +1000        PHYSICAL EXAMINATION: General:  Mechanically ventilated, synchronous, morbidly obese Neuro:  Encephalopathic (GCS 3), nonfocal, cough / gag absent HEENT:  PERRL, OETT / OGT Cardiovascular:  RRR, no m/r/g Lungs:  Bilateral diminished air entry, no w/r/r Abdomen:  Soft, nontender, bowel sounds diminished Musculoskeletal:  Symmetric pitting edema, chronic stasis dermatitis Skin:  Intact  LABS:  Recent Labs Lab 04/30/13 1615 04/30/13 1620 04/30/13 1700 04/30/13 1825 04/30/13 1827  HGB 9.6*  --   --   --   --   WBC 11.5*  --   --   --   --   PLT 245  --   --   --   --   NA 137  --   --   --   --   K 3.1*  --   --   --   --   CL 80*  --   --   --   --   CO2 >45*  --   --   --   --   GLUCOSE  145*  --   --   --   --   BUN 28*  --   --   --   --   CREATININE 1.21*  --   --   --   --   CALCIUM 10.4  --   --   --   --   AST 31  --   --   --   --   ALT 14  --   --   --   --   ALKPHOS 54  --   --   --   --   BILITOT 0.3  --   --   --   --   PROT 7.5  --   --   --   --   ALBUMIN 3.7  --   --   --   --   LATICACIDVEN  --   --  1.40  --   --   TROPONINI  --   --   --   --  <0.30  PHART  --  7.430  --  7.414  --   PCO2ART  --  90.9*  --  90.9*  --   PO2ART  --  72.0*  --  86.0  --     Recent Labs Lab 04/30/13 1603  GLUCAP 149*    CXR:  10/28 >>>  Small lung volumes, no overt airspace disease, low ETT (taped at 24 cm), CVL in good position  ASSESSMENT / PLAN:  PULMONARY A: Acute on chronic respiratory failure.  Possible pneumonia (HCAP).  Rx induced hypoventilation.  OSA/OHS.  COPD without exacerbation. P:   Gaol SpO2>92, pH>7.30  Full mechanical support Daily SBT Trend ABG / CXR Albuterol / Atrovent Hold Flonase, Synbicort, Spiriva May need tracheostomy, started conversation with family   CARDIOVASCULAR A: CAD.  Hypertension. Transient hypotension after procedural sedation / positive pressure ventilation. P:  Goal MAP > 60 Hold  Norvasc, Clonidine, Imdur A-line Neo-Synephrine PRN  RENAL A:  AKI.  Hypovolemia. Hypokalemia. P:   Trend BMP Replete K PRN (ordered 10 x 4) Hold Lasix NS 1000 x 1 Ns@75   GASTROINTESTINAL A:  Morbid obesity.  GERD. P:   NPO as intubated TF if remains intubated > 24 hours Protonix for GI Px  HEMATOLOGIC A:  Mild anemia. P:  Trend CBC Heparin for DVT Px  INFECTIOUS A:  Suspected HCAP.  PCN allergy. P:   Cultures and antibiotics as above PCT  ENDOCRINE  A:  Hyperglycemia. P:   SSI  NEUROLOGIC A:  Acute encephalopathy. P:   Goal RASS 0 Hold Abilify, Buspar, Depakote, Cymbalta, Gabapentin, Oxycodone, (the need for this needs to be addressed prior to discharge!!!) - this is the third admission since May with  similar presentation and she is continued on the same outpatient regimen nevertheless.  Propofol gtt  I have personally obtained a history, examined the patient, evaluated laboratory and imaging results, formulated the assessment and plan and placed orders.  CRITICAL CARE:  The patient is critically ill with multiple organ systems failure and requires high complexity decision making for assessment and support, frequent evaluation and titration of therapies, application of advanced monitoring technologies and extensive interpretation of multiple databases. Critical Care Time devoted to patient care services described in this note is 45 minutes.   Lonia Farber, MD Pulmonary and Critical Care Medicine [facility] Pager: [phone]  04/30/2013, 8:35 PM

## 2013-04-30 NOTE — ED Notes (Signed)
Nrsg home staff states temp 101.5 today, given 650mg  tylenol at 1200.  Pt obtunded, periodically will answer to name, at times will not withdraw from pain. Was able to state her name once. Pt resp very shallow & tachypneic, POX >96% on Bipap

## 2013-04-30 NOTE — Progress Notes (Addendum)
ANTIBIOTIC CONSULT NOTE - INITIAL  Pharmacy Consult:  Vancomycin Indication:  Suspected PNA  Allergies  Allergen Reactions  . Penicillins Anaphylaxis    Tongue swelling  . Aspirin Other (See Comments)    Per MAR  . Bee Venom Other (See Comments)    Unknown  . Lisinopril     Per MAR  . Nyquil Cold & [Dm-Doxylamine-Acetaminophen] Other (See Comments)    Unknown   . Ppd [Tuberculin Purified Protein Derivative] Other (See Comments)    unknown    Patient Measurements: Height: 5' 4.17" (163 cm) Weight: 291 lb 0.1 oz (132 kg) IBW/kg (Calculated) : 55.1  Vital Signs: BP: 174/77 mmHg (10/28 1541) Pulse Rate: 111 (10/28 1554)  Labs: No results found for this basename: WBC, HGB, PLT, LABCREA, CREATININE,  in the last 72 hours Estimated Creatinine Clearance: 69.7 ml/min (by C-G formula based on Cr of 1.12). No results found for this basename: VANCOTROUGH, Leodis Binet, VANCORANDOM, GENTTROUGH, GENTPEAK, GENTRANDOM, TOBRATROUGH, TOBRAPEAK, TOBRARND, AMIKACINPEAK, AMIKACINTROU, AMIKACIN,  in the last 72 hours   Microbiology: Recent Results (from the past 720 hour(s))  CULTURE, BLOOD (ROUTINE X 2)     Status: None   Collection Time    04/03/13  4:40 PM      Result Value Range Status   Specimen Description BLOOD HAND LEFT   Final   Special Requests BOTTLES DRAWN AEROBIC ONLY 2CC   Final   Culture  Setup Time     Final   Value: 04/04/2013 00:16     Performed at [facility]   Culture     Final   Value: GROUP B STREP(S.AGALACTIAE)ISOLATED     Note: Gram Stain Report Called to,Read Back By and Verified With: Misty Stanley BECK @ 1029 04/04/13 BY KRAWS     Performed at [facility]   Report Status 04/07/2013 FINAL   Final   Organism ID, Bacteria GROUP B STREP(S.AGALACTIAE)ISOLATED   Final  CULTURE, BLOOD (ROUTINE X 2)     Status: None   Collection Time    04/03/13  4:53 PM      Result Value Range Status   Specimen Description BLOOD RIGHT FOREARM   Final   Special  Requests BOTTLES DRAWN AEROBIC ONLY 2CC   Final   Culture  Setup Time     Final   Value: 04/04/2013 00:16     Performed at [facility]   Culture     Final   Value: NO GROWTH 5 DAYS     Performed at [facility]   Report Status 04/10/2013 FINAL   Final  CULTURE, BLOOD (ROUTINE X 2)     Status: None   Collection Time    04/03/13  8:37 PM      Result Value Range Status   Specimen Description BLOOD ARM RIGHT   Final   Special Requests BOTTLES DRAWN AEROBIC ONLY 1CC   Final   Culture  Setup Time     Final   Value: 04/04/2013 00:43     Performed at [facility]   Culture     Final   Value: NO GROWTH 5 DAYS     Performed at [facility]   Report Status 04/10/2013 FINAL   Final  CULTURE, BLOOD (ROUTINE X 2)     Status: None   Collection Time    04/03/13  8:45 PM      Result Value Range Status   Specimen Description BLOOD HAND RIGHT   Final  Special Requests BOTTLES DRAWN AEROBIC ONLY 1CC   Final   Culture  Setup Time     Final   Value: 04/04/2013 00:42     Performed at [facility]   Culture     Final   Value: NO GROWTH 5 DAYS     Performed at [facility]   Report Status 04/10/2013 FINAL   Final  MRSA PCR SCREENING     Status: None   Collection Time    04/03/13 10:24 PM      Result Value Range Status   MRSA by PCR NEGATIVE  NEGATIVE Final   Comment:            The GeneXpert MRSA Assay (FDA     approved for NASAL specimens     only), is one component of a     comprehensive MRSA colonization     surveillance program. It is not     intended to diagnose MRSA     infection nor to guide or     monitor treatment for     MRSA infections.  URINE CULTURE     Status: None   Collection Time    04/03/13 10:41 PM      Result Value Range Status   Specimen Description URINE, CATHETERIZED   Final   Special Requests NONE   Final   Culture  Setup Time     Final   Value: 04/04/2013 00:55     Performed at [facility]   Colony  Count     Final   Value: NO GROWTH     Performed at [facility]   Culture     Final   Value: NO GROWTH     Performed at [facility]   Report Status 04/05/2013 FINAL   Final    Medical History: Past Medical History  Diagnosis Date  . COPD   . Hypertension   . Chronic respiratory failure     3 L oxygen  . GERD (gastroesophageal reflux disease)   . Diabetes mellitus   . Obesity, Class III, BMI 40-49.9 (morbid obesity)   . Obstructive sleep apnea on CPAP   . Peripheral vascular disease   . Bipolar disease, chronic   . Lymphedema   . COPD (chronic obstructive pulmonary disease)   . Cellulitis   . Coronary artery disease   . Asthma   . Myocardial infarction   . Vertigo        Assessment: 3 YOF from [facility] admitted with complaints of lower level of consciousness, fever, and shortness of breath.  Patient was recently admitted with similar complaints and was treated with vancomycin and Primaxin.  Pharmacy consulted to start vancomycin for suspected PNA.  Noted Azactam 2gm IV x 1 has been ordered (anaphylaxis to PCN).  Patient's SCr is elevated compared to previous admission.  Vanc 10/28 >> Azactam 10/28 x 1 dose  10/4 VT = 17 mcg/mL (SCr 0.72, 1gm q12h)   Goal of Therapy:  Vancomycin trough level 15-20 mcg/ml   Plan:  - Vanc 2000mg  IV x 1, then 750mg  IV Q12H x 8 days total - Monitor renal fxn, clinical progress, vanc trough at Css - F/U continuation of Gram negative coverage    Decklyn Hornik D. Laney Potash, PharmD, BCPS Pager:  [phone] 04/30/2013, 4:35 PM   ============================   Addendum: - continue Azactam for Gram negative coverage   Plan: - Azactam 1gm IV Q8H, start tomorrow - Monitor renal  fxn, clinical course    Meria Crilly D. Laney Potash, PharmD, BCPS Pager:  [phone] 04/30/2013, 9:41 PM

## 2013-04-30 NOTE — ED Notes (Signed)
Placed on Bipap per RT

## 2013-04-30 NOTE — Progress Notes (Signed)
ETT repositioned to 22 cm at lips per Zubelevitzky, MD.

## 2013-04-30 NOTE — Procedures (Signed)
Arterial Catheter Insertion Procedure Note KONNI KESINGER [id number] May 26, 1951  Procedure: Insertion of Arterial Catheter  Indications: Blood pressure monitoring and Frequent blood sampling  Procedure Details Consent: Unable to obtain consent because of emergent medical necessity. Time Out: Verified patient identification, verified procedure, site/side was marked, verified correct patient position, special equipment/implants available, medications/allergies/relevent history reviewed, required imaging and test results available.  Performed  Maximum sterile technique was used including antiseptics, cap, gloves, gown, hand hygiene, mask and sheet. Skin prep: Chlorhexidine; local anesthetic administered 20 gauge catheter was inserted into right radial artery using the Seldinger technique.  Evaluation Blood flow good; BP tracing good. Complications: No apparent complications.   Malachi Paradise 04/30/2013

## 2013-04-30 NOTE — Progress Notes (Signed)
Chaplain responded to page and found pt being worked on.  Provided support for pt's family in Consultation Room A and outside Trauma B.  Stayed with family until they decided to go and wait outside.  Will follow up as needed.  Rutherford Nail Chaplain

## 2013-05-01 ENCOUNTER — Inpatient Hospital Stay (HOSPITAL_COMMUNITY): Payer: Medicaid Other

## 2013-05-01 DIAGNOSIS — I5031 Acute diastolic (congestive) heart failure: Secondary | ICD-10-CM

## 2013-05-01 DIAGNOSIS — G4733 Obstructive sleep apnea (adult) (pediatric): Secondary | ICD-10-CM

## 2013-05-01 DIAGNOSIS — I509 Heart failure, unspecified: Secondary | ICD-10-CM

## 2013-05-01 DIAGNOSIS — E1149 Type 2 diabetes mellitus with other diabetic neurological complication: Secondary | ICD-10-CM

## 2013-05-01 LAB — CBC
HCT: 29 % — ABNORMAL LOW (ref 36.0–46.0)
HCT: 29.7 % — ABNORMAL LOW (ref 36.0–46.0)
Hemoglobin: 9.4 g/dL — ABNORMAL LOW (ref 12.0–15.0)
MCH: 29.9 pg (ref 26.0–34.0)
MCHC: 32.4 g/dL (ref 30.0–36.0)
MCHC: 33 g/dL (ref 30.0–36.0)
MCV: 92 fL (ref 78.0–100.0)
Platelets: 188 10*3/uL (ref 150–400)
RDW: 14.3 % (ref 11.5–15.5)
RDW: 14.1 % (ref 11.5–15.5)

## 2013-05-01 LAB — BASIC METABOLIC PANEL
BUN: 31 mg/dL — ABNORMAL HIGH (ref 6–23)
Calcium: 9.1 mg/dL (ref 8.4–10.5)
Calcium: 9 mg/dL (ref 8.4–10.5)
GFR calc Af Amer: 52 mL/min — ABNORMAL LOW (ref >90)
GFR calc non Af Amer: 45 mL/min — ABNORMAL LOW (ref >90)
GFR calc non Af Amer: 44 mL/min — ABNORMAL LOW (ref >90)
Glucose, Bld: 108 mg/dL — ABNORMAL HIGH (ref 70–99)
Glucose, Bld: 108 mg/dL — ABNORMAL HIGH (ref 70–99)
Potassium: 2.9 mEq/L — ABNORMAL LOW (ref 3.5–5.1)
Sodium: 139 mEq/L (ref 135–145)
Sodium: 139 mEq/L (ref 135–145)

## 2013-05-01 LAB — PHOSPHORUS: Phosphorus: 3.4 mg/dL (ref 2.3–4.6)

## 2013-05-01 LAB — POCT I-STAT 3, VENOUS BLOOD GAS (G3P V)
Acid-Base Excess: >30 mmol/L — ABNORMAL HIGH (ref 0.0–2.0)
Acid-Base Excess: >30 mmol/L — ABNORMAL HIGH (ref 0.0–2.0)
O2 Saturation: 100 %
O2 Saturation: 100 %
pCO2, Ven: 86.8 mmHg (ref 45.0–50.0)
pH, Ven: 7.464 — ABNORMAL HIGH (ref 7.250–7.300)
pO2, Ven: 173 mmHg — ABNORMAL HIGH (ref 30.0–45.0)

## 2013-05-01 LAB — GLUCOSE, CAPILLARY
Glucose-Capillary: 133 mg/dL — ABNORMAL HIGH (ref 70–99)
Glucose-Capillary: 137 mg/dL — ABNORMAL HIGH (ref 70–99)

## 2013-05-01 LAB — MAGNESIUM: Magnesium: 1.6 mg/dL (ref 1.5–2.5)

## 2013-05-01 MED ORDER — SODIUM CHLORIDE 0.9 % IV SOLN
INTRAVENOUS | Status: DC | PRN
  Administered 2013-05-01: 20:00:00 via INTRAVENOUS

## 2013-05-01 MED ORDER — VITAL HIGH PROTEIN PO LIQD
1000.0000 mL | ORAL | Status: DC
  Administered 2013-05-01: 14:00:00
  Filled 2013-05-01 (×5): qty 1000

## 2013-05-01 MED ORDER — VITAL AF 1.2 CAL PO LIQD: 1000.0000 mL | ORAL | Status: DC

## 2013-05-01 MED ORDER — PRO-STAT SUGAR FREE PO LIQD
30.0000 mL | Freq: Two times a day (BID) | ORAL | Status: DC
  Administered 2013-05-01 – 2013-05-02 (×4): 30 mL via ORAL
  Filled 2013-05-01 (×6): qty 30

## 2013-05-01 MED ORDER — MAGNESIUM SULFATE 40 MG/ML IJ SOLN
2.0000 g | Freq: Once | INTRAMUSCULAR | Status: AC
  Administered 2013-05-01: 2 g via INTRAVENOUS
  Filled 2013-05-01: qty 50

## 2013-05-01 MED ORDER — CHLORHEXIDINE GLUCONATE 0.12 % MT SOLN
15.0000 mL | Freq: Two times a day (BID) | OROMUCOSAL | Status: DC
  Administered 2013-05-01 – 2013-05-07 (×13): 15 mL via OROMUCOSAL
  Filled 2013-05-01 (×14): qty 15

## 2013-05-01 MED ORDER — POTASSIUM CHLORIDE 20 MEQ/15ML (10%) PO LIQD
30.0000 meq | ORAL | Status: DC
  Administered 2013-05-01: 30 meq
  Filled 2013-05-01 (×4): qty 22.5

## 2013-05-01 MED ORDER — POTASSIUM CHLORIDE 20 MEQ/15ML (10%) PO LIQD
40.0000 meq | Freq: Three times a day (TID) | ORAL | Status: AC
  Administered 2013-05-01 (×2): 40 meq
  Filled 2013-05-01 (×2): qty 30

## 2013-05-01 NOTE — Progress Notes (Signed)
RN called to bedside d/t pt w/ increased WOB, and tachypnea.  Per RN, RN admin sedation.  Placed pt on full vent support to rest.

## 2013-05-01 NOTE — Progress Notes (Signed)
Dr. Molli Knock at bedside and changed pt to PSV 8, 5 peep.  PT tol well, per MD- no plans for extubation today, but try to wean on vent as tol.

## 2013-05-01 NOTE — Progress Notes (Signed)
INITIAL NUTRITION ASSESSMENT  DOCUMENTATION CODES Per approved criteria  -Morbid Obesity   INTERVENTION: 1.  Enteral nutrition; if pt to remain intubated, initiate Vital HP @ 20 mL/hr continuous.  Advance by 10 mL q 4 hrs to 50 mL/hr goal with Prostat BID to provide 1400 kcal, 135g protein, 1003 mL free water.  NUTRITION DIAGNOSIS: Inadequate oral intake related to inability to eat as evidenced by vent, NPO.   Monitor:  1.  Enteral nutrition; initiation with tolerance.  Enteral nutrition to provide 60-70% of estimated calorie needs (22-25 kcals/kg ideal body weight) and 100% of estimated protein needs, based on ASPEN guidelines for permissive underfeeding in critically ill obese individuals.  2.  Wt/wt change; monitor trends  Reason for Assessment: vent  62 y.o. female  Admitting Dx: AMS  ASSESSMENT: Pt admitted from NH with AMS.  Pt with h/o COPD, OSA, OHS, and on multiple psychoactive agents. No family at bedside.  Patient is currently intubated on ventilator support.  MV: 8.3 L/min Temp:Temp (24hrs), Avg:99.6 F (37.6 C), Min:98.6 F (37 C), Max:100.8 F (38.2 C)  Propofol: off  Pt is awake and alert on the vent.  Denies wt change.  Reports she was eating PTA.  Per RN, pt recently discharged from [facility] to NH.  Pt was a difficult intubation, possible trach candidate.   Height: Ht Readings from Last 1 Encounters:  04/30/13 5' 4.17" (1.63 m)    Weight: Wt Readings from Last 1 Encounters:  05/01/13 292 lb 1.8 oz (132.5 kg)    Ideal Body Weight: 120 lbs  % Ideal Body Weight: 243%  Wt Readings from Last 10 Encounters:  05/01/13 292 lb 1.8 oz (132.5 kg)  04/09/13 291 lb 0.1 oz (132 kg)  01/23/13 283 lb 8 oz (128.595 kg)  11/12/12 302 lb 4 oz (137.1 kg)  10/18/12 319 lb (144.697 kg)  09/04/11 283 lb 12.8 oz (128.731 kg)  03/23/09 280 lb (127.007 kg)  09/17/07 310 lb 9.6 oz (140.887 kg)    Usual Body Weight: 285-300 lbs  % Usual Body Weight: 100%  BMI:  Body  mass index is 49.87 kg/(m^2).  Estimated Nutritional Needs: Kcal: 2137 Permissive underfeeding:  5621-3086 Protein: 136g Fluid: >2.0 L/day  Skin: diabetic ulcer left leg  Diet Order: NPO  EDUCATION NEEDS: -Education not appropriate at this time   Intake/Output Summary (Last 24 hours) at 05/01/13 1037 Last data filed at 05/01/13 1000  Gross per 24 hour  Intake 2988.35 ml  Output   1140 ml  Net 1848.35 ml    Last BM: PTA   Labs:   Recent Labs Lab 04/30/13 1615 05/01/13 0430 05/01/13 0533  NA 137 139 139  K 3.1* 2.9* 3.0*  CL 80* 88* 88*  CO2 >45* >45* >45*  BUN 28* 30* 31*  CREATININE 1.21* 1.25* 1.26*  CALCIUM 10.4 9.1 9.0  MG  --  1.6 1.6  PHOS  --  3.4 3.4  GLUCOSE 145* 108* 108*    CBG (last 3)   Recent Labs  04/30/13 2352 05/01/13 0423 05/01/13 0753  GLUCAP 137* 115* 109*    Scheduled Meds: . albuterol  6 puff Inhalation Q4H  . antiseptic oral rinse  1 application Mouth Rinse QID  . aztreonam  1 g Intravenous Q8H  . chlorhexidine  15 mL Mouth/Throat BID  . heparin subcutaneous  5,000 Units Subcutaneous Q8H  . insulin aspart  0-15 Units Subcutaneous Q4H  . ipratropium  6 puff Inhalation Q4H  . pantoprazole (PROTONIX) IV  40 mg Intravenous Q24H  . potassium chloride  30 mEq Per Tube Q4H  . vancomycin  750 mg Intravenous Q12H    Continuous Infusions: . sodium chloride 20 mL/hr at 05/01/13 1000  . phenylephrine (NEO-SYNEPHRINE) Adult infusion    . propofol 10 mcg/kg/min (05/01/13 0730)    Past Medical History  Diagnosis Date  . COPD   . Hypertension   . Chronic respiratory failure     3 L oxygen  . GERD (gastroesophageal reflux disease)   . Diabetes mellitus   . Obesity, Class III, BMI 40-49.9 (morbid obesity)   . Obstructive sleep apnea on CPAP   . Peripheral vascular disease   . Bipolar disease, chronic   . Lymphedema   . COPD (chronic obstructive pulmonary disease)   . Cellulitis   . Coronary artery disease   . Asthma   .  Myocardial infarction   . Vertigo     Past Surgical History  Procedure Laterality Date  . Abdominal hysterectomy    . Cholecystectomy    . Appendectomy    . Tubal ligation      Loyce Dys, MS RD LDN Clinical Inpatient Dietitian Pager: [phone] Weekend/After hours pager: [phone]

## 2013-05-01 NOTE — Progress Notes (Signed)
Sputum sample collected, labeled and sent to lab w/ approp. Requisition. RN aware.

## 2013-05-01 NOTE — Progress Notes (Signed)
PULMONARY  / CRITICAL CARE MEDICINE  Name: [name] MRN: [id number] DOB: [birth year]    ADMISSION DATE:  04/30/2013 CONSULTATION DATE:  04/30/2013  REFERRING MD :  EDP PRIMARY SERVICE:  PCCM  CHIEF COMPLAINT:  Acute encephalopathy  BRIEF PATIENT DESCRIPTION: 62 yo NH resident with COPD, OSA, OHS (on BiPAP / oxygen) and on multiple psychoactive Rx sent to [facility] ED with acute encephalopathy.  Intubated for airway protection.  SIGNIFICANT EVENTS / STUDIES:  10/28  Intubated for airway protection (dificult intubation - large tongue, small oropharynx, anterior larynx, morbid obesity)  LINES / TUBES: OETT 10/28 >>> OGT 10/28 >>> Foley 10/28 >>> L [place] TLC 10/28 >>>  CULTURES: 10/28  Blood >>> 10/28  Urine >>> 10/28  Resp >>>  ANTIBIOTICS: Vancomycin 10/28 >>> Aztreonam 10/28 >>>  INTERVAL HISTORY: Much more alert and interactive.  VITAL SIGNS: Temp:  [98.6 F (37 C)-100.8 F (38.2 C)] 99 F (37.2 C) (10/29 0900) Pulse Rate:  [45-117] 82 (10/29 1108) Resp:  [9-32] 23 (10/29 1030) BP: (54-174)/(25-93) 124/57 mmHg (10/29 1108) SpO2:  [97 %-100 %] 98 % (10/29 1108) Arterial Line BP: (108-153)/(47-72) 126/53 mmHg (10/29 1030) FiO2 (%):  [40 %-60 %] 40 % (10/29 1108) Weight:  [132 kg (291 lb 0.1 oz)-132.5 kg (292 lb 1.8 oz)] 132.5 kg (292 lb 1.8 oz) (10/29 0500)  HEMODYNAMICS:   VENTILATOR SETTINGS: Vent Mode:  [-] CPAP;PSV FiO2 (%):  [40 %-60 %] 40 % Set Rate:  [12 bmp-18 bmp] 12 bmp Vt Set:  [430 mL] 430 mL PEEP:  [5 cmH20] 5 cmH20 Pressure Support:  [8 cmH20] 8 cmH20 Plateau Pressure:  [26 cmH20-35 cmH20] 26 cmH20  INTAKE / OUTPUT: Intake/Output     10/28 0701 - 10/29 0700 10/29 0701 - 10/30 0700   I.V. (mL/kg) 2291.5 (17.3) 106.9 (0.8)   NG/GT 60 30   IV Piggyback 300 200   Total Intake(mL/kg) 2651.5 (20) 336.9 (2.5)   Urine (mL/kg/hr) 875 175 (0.3)   Emesis/NG output 60 30 (0)   Total Output 935 205   Net +1716.5 +131.9         PHYSICAL  EXAMINATION: General:  Mechanically ventilated, synchronous, morbidly obese Neuro: Alert and interactive, follows commands. HEENT:  PERRL, OETT / OGT Cardiovascular:  RRR, no m/r/g Lungs:  Bilateral diminished air entry, no w/r/r Abdomen:  Soft, nontender, bowel sounds diminished Musculoskeletal:  Symmetric pitting edema, chronic stasis dermatitis Skin:  Intact  LABS:  Recent Labs Lab 04/30/13 1615 04/30/13 1620 04/30/13 1700 04/30/13 1825 04/30/13 1827 04/30/13 2200 04/30/13 2244 05/01/13 0430 05/01/13 0533  HGB 9.6*  --   --   --   --   --   --  9.4* 9.8*  WBC 11.5*  --   --   --   --   --   --  6.3 6.2  PLT 245  --   --   --   --   --   --  188 184  NA 137  --   --   --   --   --   --  139 139  K 3.1*  --   --   --   --   --   --  2.9* 3.0*  CL 80*  --   --   --   --   --   --  88* 88*  CO2 >45*  --   --   --   --   --   --  >  45* >45*  GLUCOSE 145*  --   --   --   --   --   --  108* 108*  BUN 28*  --   --   --   --   --   --  30* 31*  CREATININE 1.21*  --   --   --   --   --   --  1.25* 1.26*  CALCIUM 10.4  --   --   --   --   --   --  9.1 9.0  MG  --   --   --   --   --   --   --  1.6 1.6  PHOS  --   --   --   --   --   --   --  3.4 3.4  AST 31  --   --   --   --   --   --   --   --   ALT 14  --   --   --   --   --   --   --   --   ALKPHOS 54  --   --   --   --   --   --   --   --   BILITOT 0.3  --   --   --   --   --   --   --   --   PROT 7.5  --   --   --   --   --   --   --   --   ALBUMIN 3.7  --   --   --   --   --   --   --   --   LATICACIDVEN  --   --  1.40  --   --   --   --   --   --   TROPONINI  --   --   --   --  <0.30  --   --   --   --   PROCALCITON  --   --   --   --   --  0.48  --  0.55  --   PHART  --  7.430  --  7.414  --   --  7.489*  --   --   PCO2ART  --  90.9*  --  90.9*  --   --  68.4*  --   --   PO2ART  --  72.0*  --  86.0  --   --  117.0*  --   --    Recent Labs Lab 04/30/13 1603 04/30/13 2352 05/01/13 0423 05/01/13 0753 05/01/13 1113   GLUCAP 149* 137* 115* 109* 126*   CXR:  10/28 >>>  Small lung volumes, no overt airspace disease, low ETT (taped at 24 cm), CVL in good position  ASSESSMENT / PLAN:  PULMONARY A: Acute on chronic respiratory failure.  Possible pneumonia (HCAP).  Rx induced hypoventilation.  OSA/OHS.  COPD without exacerbation. P:   - Begin PS trials. - No extubation given history, will need to converse with family and patient regarding tracheostomy placement as this will become a recurrent issues. - D/C all psych medications (severe polypharmacy). - Daily SBT. - Trend ABG / CXR. - Albuterol / Atrovent. - Hold Flonase, Synbicort, Spiriva. - RN to arrange for family meeting in AM at 10 to decide on trach vs EOL discussion.  CARDIOVASCULAR A: CAD.  Hypertension. Transient hypotension after procedural sedation / positive pressure ventilation. P:  - Goal MAP > 65. - Continue to hold  Norvasc, Clonidine, Imdur. - Neo-Synephrine PRN for BP support.  RENAL A:  AKI.  Hypovolemia. Hypokalemia. P:   - Trend BMP. - PO K ordered. - Hold Lasix. - KVO IVF. - Follow CVP.  GASTROINTESTINAL A:  Morbid obesity.  GERD. P:   - Consult nutrition for TF. - Protonix for GI Px.  HEMATOLOGIC A:  Mild anemia. P:  - Trend CBC. - Heparin for DVT Px.  INFECTIOUS A:  Suspected HCAP.  PCN allergy. P:   - Cultures and antibiotics as above.  ENDOCRINE  A:  Hyperglycemia. P:   - SSI. - CBGs.  NEUROLOGIC A:  Acute encephalopathy. P:   - Goal RASS 0. - Hold Abilify, Buspar, Depakote, Cymbalta, Gabapentin, Oxycodone, (the need for this needs to be addressed prior to discharge!!!) - this is the third admission since May with similar presentation and she is continued on the same outpatient regimen nevertheless).  - Propofol gtt  I have personally obtained a history, examined the patient, evaluated laboratory and imaging results, formulated the assessment and plan and placed orders.  CRITICAL CARE:  The  patient is critically ill with multiple organ systems failure and requires high complexity decision making for assessment and support, frequent evaluation and titration of therapies, application of advanced monitoring technologies and extensive interpretation of multiple databases. Critical Care Time devoted to patient care services described in this note is 35 minutes.   Koren Bound, MD Pulmonary and Critical Care Medicine [facility] Pager: [phone]  05/01/2013, 11:36 AM

## 2013-05-01 NOTE — Clinical Social Work Note (Addendum)
Pt is intubated, so CSW called pt's daughter Arelia Longest ([phone]) to verify that pt is from [facility]. CSW left Aurea Graff a voicemail introducing herself and explaining that CSW facilitates discharge back to SNF when pt is medically ready to leave the hospital. CSW asked Aurea Graff to call CSW back and verify that pt is from [facility] so CSW can communicate with them to facilitate discharge when the time comes. CSW provided her number and is awaiting call.  Addendum: CSW called Aurea Graff again attempting to verify that pt is from [facility]. CSW called [facility] and they confirmed that pt is from this facility.   Maryclare Labrador, MSW, [facility] Clinical Social Worker [phone]

## 2013-05-01 NOTE — Clinical Social Work Psychosocial (Signed)
Clinical Social Work Department BRIEF PSYCHOSOCIAL ASSESSMENT 05/01/2013  Patient:  [name], [name]     Account Number:  [id number]     Admit date:  04/30/2013  Clinical Social Worker:  Varney Biles  Date/Time:  05/01/2013 02:05 PM  Referred by:  Physician  Date Referred:  05/01/2013 Referred for  SNF Placement   Other Referral:   Interview type:  Family Other interview type:   CSW spoke with pt's daughter [name] over the phone.    PSYCHOSOCIAL DATA Living Status:  FACILITY Admitted from facility:  [facility] Level of care:  Skilled Nursing Facility Primary support name:  Arelia Longest [phone]) Primary support relationship to patient:  CHILD, ADULT Degree of support available:   Good--pt has a daughter, [name], who lives in [place] and visits pt at [facility] and will be visiting pt in the hospital.  Pt has lived at [facility] since 2005.    CURRENT CONCERNS Current Concerns  Post-Acute Placement   Other Concerns:    SOCIAL WORK ASSESSMENT / PLAN CSW spoke over the phone with pt's daughter [name]. [name] confirmed that pt is from [facility] and she has lived there since 2005. CSW explained her role in facilitating discharge back to SNF and explained that CSW has already been in touch with [facility]. [name] expressed appreciation and says she will be visiting her mother tonight.   Assessment/plan status:  Psychosocial Support/Ongoing Assessment of Needs Other assessment/ plan:   Information/referral to community resources:   SNF [facility]).    PATIENT'S/FAMILY'S RESPONSE TO PLAN OF CARE: Pt's daughter receptive to CSW telephone conversation and understanding of CSW role.       Maryclare Labrador, MSW, [facility] Clinical Social Worker [phone]

## 2013-05-01 NOTE — Progress Notes (Signed)
[facility] ADULT ICU REPLACEMENT PROTOCOL FOR AM LAB REPLACEMENT ONLY  The patient does apply for the [facility] Adult ICU Electrolyte Replacment Protocol based on the criteria listed below:   1. Is GFR >/= 40 ml/min? yes  Patient's GFR today is 45 2. Is urine output >/= 0.5 ml/kg/hr for the last 6 hours? yes Patient's UOP is .62 ml/kg/hr 3. Is BUN < 60 mg/dL? yes  Patient's BUN today is 30 4. Abnormal electrolyte(s): Mg 1.6 5. Ordered repletion with: per protocol 6. If a panic level lab has been reported, has the CCM MD in charge been notified? yes.   Physician:  D Faye Ramsay 05/01/2013 6:27 AM

## 2013-05-02 ENCOUNTER — Inpatient Hospital Stay (HOSPITAL_COMMUNITY): Payer: Medicaid Other

## 2013-05-02 DIAGNOSIS — J9610 Chronic respiratory failure, unspecified whether with hypoxia or hypercapnia: Secondary | ICD-10-CM

## 2013-05-02 LAB — URINE CULTURE
Colony Count: NO GROWTH
Culture: NO GROWTH

## 2013-05-02 LAB — BLOOD GAS, ARTERIAL
Acid-Base Excess: 21.4 mmol/L — ABNORMAL HIGH (ref 0.0–2.0)
Drawn by: 331761
MECHVT: 430 mL
O2 Saturation: 96.7 %
PEEP: 5 cmH2O
Patient temperature: 100.4
RATE: 12 resp/min
pH, Arterial: 7.426 (ref 7.350–7.450)

## 2013-05-02 LAB — TRIGLYCERIDES: Triglycerides: 190 mg/dL — ABNORMAL HIGH (ref <150)

## 2013-05-02 LAB — BASIC METABOLIC PANEL
Calcium: 9 mg/dL (ref 8.4–10.5)
Chloride: 89 mEq/L — ABNORMAL LOW (ref 96–112)
Creatinine, Ser: 1.12 mg/dL — ABNORMAL HIGH (ref 0.50–1.10)
GFR calc Af Amer: 59 mL/min — ABNORMAL LOW (ref >90)
Sodium: 140 mEq/L (ref 135–145)

## 2013-05-02 LAB — PROCALCITONIN: Procalcitonin: 0.51 ng/mL

## 2013-05-02 LAB — CBC
HCT: 29 % — ABNORMAL LOW (ref 36.0–46.0)
MCV: 91.8 fL (ref 78.0–100.0)
Platelets: 188 10*3/uL (ref 150–400)
RDW: 14.3 % (ref 11.5–15.5)
WBC: 8.4 10*3/uL (ref 4.0–10.5)

## 2013-05-02 LAB — GLUCOSE, CAPILLARY
Glucose-Capillary: 136 mg/dL — ABNORMAL HIGH (ref 70–99)
Glucose-Capillary: 130 mg/dL — ABNORMAL HIGH (ref 70–99)
Glucose-Capillary: 147 mg/dL — ABNORMAL HIGH (ref 70–99)
Glucose-Capillary: 110 mg/dL — ABNORMAL HIGH (ref 70–99)

## 2013-05-02 LAB — MAGNESIUM: Magnesium: 2 mg/dL (ref 1.5–2.5)

## 2013-05-02 MED ORDER — POTASSIUM CHLORIDE 20 MEQ/15ML (10%) PO LIQD
40.0000 meq | Freq: Three times a day (TID) | ORAL | Status: AC
  Administered 2013-05-02 (×2): 40 meq
  Filled 2013-05-02 (×2): qty 30

## 2013-05-02 MED ORDER — PROPOFOL 10 MG/ML IV EMUL: 5.0000 ug/kg/min | INTRAVENOUS | Status: DC

## 2013-05-02 MED ORDER — VECURONIUM BROMIDE 10 MG IV SOLR
10.0000 mg | Freq: Once | INTRAVENOUS | Status: AC
  Administered 2013-05-03: 10 mg via INTRAVENOUS
  Filled 2013-05-02: qty 10

## 2013-05-02 MED ORDER — MIDAZOLAM HCL 2 MG/2ML IJ SOLN: 4.0000 mg | Freq: Once | INTRAMUSCULAR | Status: DC

## 2013-05-02 MED ORDER — FENTANYL CITRATE 0.05 MG/ML IJ SOLN
200.0000 ug | Freq: Once | INTRAMUSCULAR | Status: DC
  Filled 2013-05-02: qty 4

## 2013-05-02 MED ORDER — ETOMIDATE 2 MG/ML IV SOLN
40.0000 mg | Freq: Once | INTRAVENOUS | Status: DC
  Filled 2013-05-02: qty 20

## 2013-05-02 MED ORDER — PROPOFOL 10 MG/ML IV EMUL: 5.0000 ug/kg/min | Freq: Once | INTRAVENOUS | Status: DC

## 2013-05-02 MED ORDER — SODIUM CHLORIDE 0.9 % IV SOLN
500.0000 mg | Freq: Two times a day (BID) | INTRAVENOUS | Status: DC
  Administered 2013-05-02 – 2013-05-04 (×4): 500 mg via INTRAVENOUS
  Filled 2013-05-02 (×6): qty 500

## 2013-05-02 MED ORDER — MORPHINE SULFATE 2 MG/ML IJ SOLN
2.0000 mg | INTRAMUSCULAR | Status: DC | PRN
  Administered 2013-05-02 (×2): 2 mg via INTRAVENOUS
  Administered 2013-05-03 – 2013-05-06 (×7): 4 mg via INTRAVENOUS
  Administered 2013-05-07 – 2013-05-08 (×2): 2 mg via INTRAVENOUS
  Filled 2013-05-02: qty 2
  Filled 2013-05-02: qty 1
  Filled 2013-05-02 (×3): qty 2
  Filled 2013-05-02: qty 1
  Filled 2013-05-02: qty 2
  Filled 2013-05-02: qty 1
  Filled 2013-05-02 (×2): qty 2
  Filled 2013-05-02: qty 1

## 2013-05-02 MED ORDER — LORAZEPAM 2 MG/ML IJ SOLN
2.0000 mg | INTRAMUSCULAR | Status: DC | PRN
  Administered 2013-05-02: 2 mg via INTRAVENOUS
  Filled 2013-05-02: qty 1

## 2013-05-02 NOTE — Clinical Social Work Note (Signed)
CSW received a call from pt's daughter Arelia Longest, who asked if CSW checked with [facility] to verify they can care for pts on trachs. CSW informed her that CSW has called [facility] and verified they can care for pt when she discharges from hospital if she has the trach collar. CSW invited Aurea Graff to call with any other questions or concerns she may have.    Maryclare Labrador, MSW, [facility] Clinical Social Worker [phone]

## 2013-05-02 NOTE — Progress Notes (Signed)
CRITICAL VALUE ALERT  Critical value received:  PCO2 on ABG's = 74.1  Date of notification:  05/02/2013  Time of notification:0454  Critical value read back:yes  Nurse who received alert:  Charna Elizabeth RN  MD notified (1st page):  Dr Darrick Penna (eLink nurse)  Time of first page:  [phone]  MD notified (2nd page):  Time of second page:  Responding MD: eLink nurse  Time MD responded:  [phone]

## 2013-05-02 NOTE — Progress Notes (Signed)
PULMONARY  / CRITICAL CARE MEDICINE  Name: [name] MRN: [id number] DOB: [birth year]    ADMISSION DATE:  04/30/2013 CONSULTATION DATE:  04/30/2013  REFERRING MD :  EDP PRIMARY SERVICE:  PCCM  CHIEF COMPLAINT:  Acute encephalopathy  BRIEF PATIENT DESCRIPTION: 62 yo NH resident with COPD, OSA, OHS (on BiPAP / oxygen) and on multiple psychoactive Rx sent to [facility] ED with acute encephalopathy.  Intubated for airway protection.  SIGNIFICANT EVENTS / STUDIES:  10/28  Intubated for airway protection (dificult intubation - large tongue, small oropharynx, anterior larynx, morbid obesity)  LINES / TUBES: OETT 10/28 >>> OGT 10/28 >>> Foley 10/28 >>> L [place] TLC 10/28 >>>  CULTURES: 10/28  Blood >>> 10/28  Urine >>> 10/28  Resp >>>  ANTIBIOTICS: Vancomycin 10/28 >>> Aztreonam 10/28 >>>  INTERVAL HISTORY: Much more alert and interactive.  VITAL SIGNS: Temp:  [99 F (37.2 C)-100.9 F (38.3 C)] 99.5 F (37.5 C) (10/30 0800) Pulse Rate:  [43-91] 81 (10/30 0800) Resp:  [14-30] 27 (10/30 0800) BP: (89-124)/(41-57) 106/55 mmHg (10/30 0800) SpO2:  [95 %-100 %] 98 % (10/30 0805) Arterial Line BP: (98-135)/(44-61) 125/53 mmHg (10/30 0800) FiO2 (%):  [40 %] 40 % (10/30 0805) Weight:  [131.7 kg (290 lb 5.5 oz)] 131.7 kg (290 lb 5.5 oz) (10/30 0435)  HEMODYNAMICS: CVP:  [6 mmHg-10 mmHg] 9 mmHg VENTILATOR SETTINGS: Vent Mode:  [-] PSV;CPAP FiO2 (%):  [40 %] 40 % Set Rate:  [12 bmp] 12 bmp Vt Set:  [430 mL] 430 mL PEEP:  [5 cmH20] 5 cmH20 Pressure Support:  [5 cmH20-8 cmH20] 5 cmH20 Plateau Pressure:  [23 cmH20-24 cmH20] 24 cmH20  INTAKE / OUTPUT: Intake/Output     10/29 0701 - 10/30 0700 10/30 0701 - 10/31 0700   I.V. (mL/kg) 589.2 (4.5) 20 (0.2)   NG/GT 850.7 50   IV Piggyback 450    Total Intake(mL/kg) 1889.9 (14.4) 70 (0.5)   Urine (mL/kg/hr) 1175 (0.4) 130 (0.3)   Emesis/NG output 30 (0)    Stool 1 (0)    Total Output 1206 130   Net +683.9 -60        Stool  Occurrence 1 x 1 x    PHYSICAL EXAMINATION: General:  Mechanically ventilated, synchronous, morbidly obese Neuro: Alert and interactive, follows commands. HEENT:  PERRL, OETT / OGT Cardiovascular:  RRR, no m/r/g Lungs:  Bilateral diminished air entry, no w/r/r Abdomen:  Soft, nontender, bowel sounds diminished Musculoskeletal:  Symmetric pitting edema, chronic stasis dermatitis Skin:  Intact  LABS:  Recent Labs Lab 04/30/13 1615  04/30/13 1700 04/30/13 1825 04/30/13 1827 04/30/13 2200 04/30/13 2244 05/01/13 0430 05/01/13 0533 05/02/13 0430 05/02/13 0435  HGB 9.6*  --   --   --   --   --   --  9.4* 9.8* 9.6*  --   WBC 11.5*  --   --   --   --   --   --  6.3 6.2 8.4  --   PLT 245  --   --   --   --   --   --  188 184 188  --   NA 137  --   --   --   --   --   --  139 139 140  --   K 3.1*  --   --   --   --   --   --  2.9* 3.0* 3.1*  --   CL 80*  --   --   --   --   --   --  88* 88* 89*  --   CO2 >45*  --   --   --   --   --   --  >45* >45* 45*  --   GLUCOSE 145*  --   --   --   --   --   --  108* 108* 133*  --   BUN 28*  --   --   --   --   --   --  30* 31* 32*  --   CREATININE 1.21*  --   --   --   --   --   --  1.25* 1.26* 1.12*  --   CALCIUM 10.4  --   --   --   --   --   --  9.1 9.0 9.0  --   MG  --   --   --   --   --   --   --  1.6 1.6 2.0  --   PHOS  --   --   --   --   --   --   --  3.4 3.4 2.9  --   AST 31  --   --   --   --   --   --   --   --   --   --   ALT 14  --   --   --   --   --   --   --   --   --   --   ALKPHOS 54  --   --   --   --   --   --   --   --   --   --   BILITOT 0.3  --   --   --   --   --   --   --   --   --   --   PROT 7.5  --   --   --   --   --   --   --   --   --   --   ALBUMIN 3.7  --   --   --   --   --   --   --   --   --   --   LATICACIDVEN  --   --  1.40  --   --   --   --   --   --   --   --   TROPONINI  --   --   --   --  <0.30  --   --   --   --   --   --   PROCALCITON  --   --   --   --   --  0.48  --  0.55  --  0.51  --   PHART   --   < >  --  7.414  --   --  7.489*  --   --   --  7.426  PCO2ART  --   < >  --  90.9*  --   --  68.4*  --   --   --  74.1*  PO2ART  --   < >  --  86.0  --   --  117.0*  --   --   --  90.6  < > = values in this interval not displayed.  Recent Labs Lab 05/01/13 1113 05/01/13 1659 05/01/13 2005  05/01/13 2332 05/02/13 0801  GLUCAP 126* 124* 145* 133* 130*   CXR:  10/28 >>>  Small lung volumes, no overt airspace disease, low ETT (taped at 24 cm), CVL in good position  ASSESSMENT / PLAN:  PULMONARY A: Acute on chronic respiratory failure.  Possible pneumonia (HCAP).  Rx induced hypoventilation.  OSA/OHS.  COPD without exacerbation. P:   - Continue PS trials. - Spoke with patient, will trach in AM. - D/C all psych medications (severe polypharmacy). - Daily SBT. - Trend ABG / CXR. - Albuterol / Atrovent. - Hold Flonase, Synbicort, Spiriva. - Family meeting today at 10 AM.  CARDIOVASCULAR A: CAD.  Hypertension. Transient hypotension after procedural sedation / positive pressure ventilation. P:  - Goal MAP > 65. - Continue to hold  Norvasc, Clonidine, Imdur. - Neo-Synephrine PRN for BP support.  RENAL A:  AKI.  Hypovolemia. Hypokalemia. P:   - Trend BMP. - PO K ordered. - Hold Lasix. - KVO IVF. - Follow CVP.  GASTROINTESTINAL A:  Morbid obesity.  GERD. P:   - Consult nutrition for TF. - Protonix for GI Px.  HEMATOLOGIC A:  Mild anemia. P:  - Trend CBC. - Heparin for DVT Px.  INFECTIOUS A:  Suspected HCAP.  PCN allergy. P:   - Cultures and antibiotics as above.  ENDOCRINE  A:  Hyperglycemia. P:   - SSI. - CBGs.  NEUROLOGIC A:  Acute encephalopathy. P:   - Goal RASS 0. - Hold Abilify, Buspar, Depakote, Cymbalta, Gabapentin, Oxycodone, (the need for this needs to be addressed prior to discharge!!!) - this is the third admission since May with similar presentation and she is continued on the same outpatient regimen nevertheless).  - Propofol gtt  I  have personally obtained a history, examined the patient, evaluated laboratory and imaging results, formulated the assessment and plan and placed orders.  CRITICAL CARE:  The patient is critically ill with multiple organ systems failure and requires high complexity decision making for assessment and support, frequent evaluation and titration of therapies, application of advanced monitoring technologies and extensive interpretation of multiple databases. Critical Care Time devoted to patient care services described in this note is 45 minutes.   Koren Bound, MD Pulmonary and Critical Care Medicine [facility] Pager: [phone]  05/02/2013, 9:54 AM

## 2013-05-02 NOTE — ED Provider Notes (Signed)
I saw and evaluated the patient, reviewed the resident's note and I agree with the findings and plan.  EKG Interpretation     Ventricular Rate:  108 PR Interval:  166 QRS Duration: 81 QT Interval:  352 QTC Calculation: 472 R Axis:   9 Text Interpretation:  Sinus tachycardia Premature ventricular complexes           Pt presented w hx acute and chronic respiratory failure, copd, obesity, hypoventilation syndrome.  Pt noted to have altered mental status today w decreased alertness and increased difficulty breathing. Pt not verbally responsive to questioning - level 5 caveat.  Labs. Iv. Continuous pulse ox and monitor.   Albuterol and atrovent nebs.  resp therapy consult. Bipap.  Multiple additional rechecks during ed stay - pt w hx recent significant resp failure event w hypercarbia that responded to tx w bipap, therefore pt given extended bipap trial in ED.  Minimal improvement. Critical care consult. Admission. Critical care team electing to intubate in ed.  CRITICAL CARE RE acute and chronic respiratory failure, resp distress, hypercarbia, altere mental status.  Performed by: Suzi Roots Total critical care time: 45 Critical care time was exclusive of separately billable procedures and treating other patients. Critical care was necessary to treat or prevent imminent or life-threatening deterioration. Critical care was time spent personally by me on the following activities: development of treatment plan with patient and/or surrogate as well as nursing, discussions with consultants, evaluation of patient's response to treatment, examination of patient, obtaining history from patient or surrogate, ordering and performing treatments and interventions, ordering and review of laboratory studies, ordering and review of radiographic studies, pulse oximetry and re-evaluation of patient's condition.    Suzi Roots, MD 05/02/13 [phone]

## 2013-05-02 NOTE — Clinical Social Work Note (Signed)
RNCM called CSW asking if [facility] can take pt on trach collar. CSW informed medical team that they can do this. CSW called [facility] and verified that they can take pt back to facility with trach.    Maryclare Labrador, MSW, [facility] Clinical Social Worker [phone]

## 2013-05-02 NOTE — Progress Notes (Signed)
ANTIBIOTIC CONSULT NOTE - Follow up  Pharmacy Consult:  Vancomycin and Aztreonam  Indication:  Suspected PNA  Allergies  Allergen Reactions  . Penicillins Anaphylaxis    Tongue swelling  . Aspirin Other (See Comments)    Per MAR  . Bee Venom Other (See Comments)    Unknown  . Lisinopril     Per MAR  . Nyquil Cold & [Dm-Doxylamine-Acetaminophen] Other (See Comments)    Unknown   . Ppd [Tuberculin Purified Protein Derivative] Other (See Comments)    unknown    Patient Measurements: Height: 5' 4.17" (163 cm) Weight: 290 lb 5.5 oz (131.7 kg) IBW/kg (Calculated) : 55.1  Vital Signs: Temp: 99.5 F (37.5 C) (10/30 0800) Temp src: Core (Comment) (10/30 0800) BP: 106/55 mmHg (10/30 0800) Pulse Rate: 81 (10/30 0800)  Labs:  Recent Labs  05/01/13 0430 05/01/13 0533 05/02/13 0430  WBC 6.3 6.2 8.4  HGB 9.4* 9.8* 9.6*  PLT 188 184 188  CREATININE 1.25* 1.26* 1.12*   Estimated Creatinine Clearance: 69.6 ml/min (by C-G formula based on Cr of 1.12).  Recent Labs  05/02/13 0730  VANCOTROUGH 21.2*     Assessment: 62 YOF from [facility] admitted with complaints of lower level of consciousness, fever, and shortness of breath.  Patient was recently admitted with similar complaints and was treated with vancomycin and Primaxin. Patient's SCr is elevated compared to previous admission but has improved since yesterday.   Labs: SCr 1.26>>1.12, CrCl 69.6, WBC 8.4, Afebrile  Vanc 10/28 >> Azactam 10/28 x 1 dose  10/4 VT = 17 mcg/mL (SCr 0.72, 1gm q12h) 10/30 VT = 21.2 mcg/mL (SCr 1.12, 750 mg Q 12h)   Goal of Therapy:  Vancomycin trough level 15-20 mcg/ml   Plan:  - Decrease Vancomycin dose to 500 mg Q 12 hours. - Continue aztreonam 1 gm Q 8H.  - Monitor renal fxn, clinical progress, vanc trough at Css   Vinnie Level, PharmD.  [place] License #[id number] Application for [place] reciprocity pending  Clinical Pharmacist Pager [phone]   I have reviewed the above and  agree with the plan  Harland German, Pharm D 05/02/2013 10:06 AM

## 2013-05-02 NOTE — Progress Notes (Signed)
eLink Physician-Brief Progress Note Patient Name: [name] DOB: 06-21-1951 MRN: [id number]  Date of Service  05/02/2013   HPI/Events of Note  Hypokalemia   eICU Interventions  Potassium replaced   Intervention Category Intermediate Interventions: Electrolyte abnormality - evaluation and management  Linden Tagliaferro 05/02/2013, 5:20 AM

## 2013-05-02 NOTE — Plan of Care (Signed)
Problem: Phase I Progression Outcomes Goal: Tracheostomy by Vent Day 14 Outcome: Progressing Plan tracheostomy tomorrow.

## 2013-05-03 ENCOUNTER — Inpatient Hospital Stay (HOSPITAL_COMMUNITY): Payer: Medicaid Other

## 2013-05-03 DIAGNOSIS — J449 Chronic obstructive pulmonary disease, unspecified: Secondary | ICD-10-CM

## 2013-05-03 LAB — BLOOD GAS, ARTERIAL
Acid-Base Excess: 17.6 mmol/L — ABNORMAL HIGH (ref 0.0–2.0)
Drawn by: 331761
MECHVT: 430 mL
O2 Saturation: 98.6 %
PEEP: 5 cmH2O
RATE: 12 resp/min
pCO2 arterial: 66 mmHg (ref 35.0–45.0)
pH, Arterial: 7.431 (ref 7.350–7.450)

## 2013-05-03 LAB — CBC
HCT: 29.4 % — ABNORMAL LOW (ref 36.0–46.0)
Hemoglobin: 9.5 g/dL — ABNORMAL LOW (ref 12.0–15.0)
MCH: 30 pg (ref 26.0–34.0)
MCHC: 32.3 g/dL (ref 30.0–36.0)
MCV: 92.7 fL (ref 78.0–100.0)
Platelets: 205 10*3/uL (ref 150–400)
RBC: 3.17 MIL/uL — ABNORMAL LOW (ref 3.87–5.11)
RDW: 14.3 % (ref 11.5–15.5)
WBC: 7.4 10*3/uL (ref 4.0–10.5)

## 2013-05-03 LAB — PROTIME-INR
INR: 1.01 (ref 0.00–1.49)
Prothrombin Time: 13.1 seconds (ref 11.6–15.2)

## 2013-05-03 LAB — APTT: aPTT: 31 seconds (ref 24–37)

## 2013-05-03 LAB — BASIC METABOLIC PANEL
CO2: 44 mEq/L (ref 19–32)
Calcium: 9.3 mg/dL (ref 8.4–10.5)
Creatinine, Ser: 0.83 mg/dL (ref 0.50–1.10)
GFR calc Af Amer: 85 mL/min — ABNORMAL LOW (ref >90)
GFR calc non Af Amer: 73 mL/min — ABNORMAL LOW (ref >90)
Glucose, Bld: 112 mg/dL — ABNORMAL HIGH (ref 70–99)
Sodium: 142 mEq/L (ref 135–145)

## 2013-05-03 LAB — GLUCOSE, CAPILLARY
Glucose-Capillary: 105 mg/dL — ABNORMAL HIGH (ref 70–99)
Glucose-Capillary: 89 mg/dL (ref 70–99)
Glucose-Capillary: 91 mg/dL (ref 70–99)

## 2013-05-03 LAB — CULTURE, RESPIRATORY W GRAM STAIN

## 2013-05-03 LAB — CULTURE, RESPIRATORY

## 2013-05-03 LAB — MAGNESIUM: Magnesium: 2 mg/dL (ref 1.5–2.5)

## 2013-05-03 MED ORDER — MIDAZOLAM HCL 2 MG/2ML IJ SOLN
1.0000 mg | INTRAMUSCULAR | Status: DC | PRN
  Administered 2013-05-03 – 2013-05-05 (×7): 2 mg via INTRAVENOUS
  Filled 2013-05-03 (×7): qty 2

## 2013-05-03 MED ORDER — MIDAZOLAM HCL 2 MG/2ML IJ SOLN
INTRAMUSCULAR | Status: AC
  Filled 2013-05-03: qty 4

## 2013-05-03 MED ORDER — POTASSIUM CHLORIDE 20 MEQ/15ML (10%) PO LIQD
40.0000 meq | Freq: Three times a day (TID) | ORAL | Status: AC
  Filled 2013-05-03 (×2): qty 30

## 2013-05-03 MED ORDER — VITAL HIGH PROTEIN PO LIQD
1000.0000 mL | ORAL | Status: DC
  Administered 2013-05-03 – 2013-05-06 (×4): 1000 mL
  Filled 2013-05-03 (×7): qty 1000

## 2013-05-03 MED ORDER — FUROSEMIDE 10 MG/ML IJ SOLN
20.0000 mg | Freq: Four times a day (QID) | INTRAMUSCULAR | Status: AC
  Administered 2013-05-03 (×2): 20 mg via INTRAVENOUS
  Filled 2013-05-03 (×2): qty 2

## 2013-05-03 MED ORDER — ARIPIPRAZOLE 10 MG PO TABS
10.0000 mg | ORAL_TABLET | Freq: Every day | ORAL | Status: DC
  Administered 2013-05-04 – 2013-05-09 (×6): 10 mg via ORAL
  Filled 2013-05-03 (×8): qty 1

## 2013-05-03 MED ORDER — FENTANYL CITRATE 0.05 MG/ML IJ SOLN
100.0000 ug | Freq: Once | INTRAMUSCULAR | Status: AC
  Administered 2013-05-03: 100 ug via INTRAVENOUS

## 2013-05-03 MED ORDER — FENTANYL CITRATE 0.05 MG/ML IJ SOLN
INTRAMUSCULAR | Status: AC
  Filled 2013-05-03: qty 4

## 2013-05-03 MED ORDER — DULOXETINE HCL 60 MG PO CPEP
60.0000 mg | ORAL_CAPSULE | Freq: Two times a day (BID) | ORAL | Status: DC
  Filled 2013-05-03 (×3): qty 1

## 2013-05-03 MED ORDER — MIDAZOLAM HCL 2 MG/2ML IJ SOLN
2.0000 mg | Freq: Once | INTRAMUSCULAR | Status: AC
  Administered 2013-05-03: 2 mg via INTRAVENOUS

## 2013-05-03 MED ORDER — BUSPIRONE HCL 10 MG PO TABS
10.0000 mg | ORAL_TABLET | Freq: Two times a day (BID) | ORAL | Status: DC
  Administered 2013-05-04 – 2013-05-09 (×11): 10 mg via ORAL
  Filled 2013-05-03 (×13): qty 1

## 2013-05-03 MED ORDER — FENTANYL CITRATE 0.05 MG/ML IJ SOLN
25.0000 ug | INTRAMUSCULAR | Status: DC | PRN
  Administered 2013-05-03 – 2013-05-04 (×4): 50 ug via INTRAVENOUS
  Filled 2013-05-03 (×5): qty 2

## 2013-05-03 MED ORDER — PRO-STAT SUGAR FREE PO LIQD
60.0000 mL | Freq: Three times a day (TID) | ORAL | Status: DC
  Administered 2013-05-04 – 2013-05-07 (×9): 60 mL
  Filled 2013-05-03 (×14): qty 60

## 2013-05-03 NOTE — Progress Notes (Signed)
PULMONARY  / CRITICAL CARE MEDICINE  Name: [name] MRN: [id number] DOB: February 28, 1951    ADMISSION DATE:  04/30/2013 CONSULTATION DATE:  04/30/2013  REFERRING MD :  EDP PRIMARY SERVICE:  PCCM  CHIEF COMPLAINT:  Acute encephalopathy  BRIEF PATIENT DESCRIPTION: 62 yo NH resident with COPD, OSA, OHS (on BiPAP / oxygen) and on multiple psychoactive Rx sent to [facility] ED with acute encephalopathy.  Intubated for airway protection.  SIGNIFICANT EVENTS / STUDIES:  10/28  Intubated for airway protection (dificult intubation - large tongue, small oropharynx, anterior larynx, morbid obesity)  LINES / TUBES: OETT 10/28 >>>10/31 Trach (JY) 10/31>>> OGT 10/28 >>>10/31 NGT 10/31>>> Foley 10/28 >>> L [place] TLC 10/28 >>>  CULTURES: 10/28  Blood >>>NTD 10/28  Urine >>>NTD 10/28  Resp >>>Yeast  ANTIBIOTICS: Vancomycin 10/28 >>> Aztreonam 10/28 >>>  INTERVAL HISTORY: Much more alert and interactive.  VITAL SIGNS: Temp:  [97.9 F (36.6 C)-100 F (37.8 C)] 99.3 F (37.4 C) (10/31 0405) Pulse Rate:  [45-94] 80 (10/31 0600) Resp:  [13-27] 13 (10/31 0600) BP: (102-139)/(58-90) 102/90 mmHg (10/31 0405) SpO2:  [96 %-100 %] 99 % (10/31 0813) Arterial Line BP: (111-159)/(53-117) 123/117 mmHg (10/31 0600) FiO2 (%):  [40 %] 40 % (10/31 0813) Weight:  [132 kg (291 lb 0.1 oz)] 132 kg (291 lb 0.1 oz) (10/31 0500)  HEMODYNAMICS: CVP:  [6 mmHg-13 mmHg] 6 mmHg VENTILATOR SETTINGS: Vent Mode:  [-] PRVC FiO2 (%):  [40 %] 40 % Set Rate:  [12 bmp] 12 bmp Vt Set:  [430 mL] 430 mL PEEP:  [5 cmH20] 5 cmH20 Pressure Support:  [5 cmH20] 5 cmH20 Plateau Pressure:  [23 cmH20-24 cmH20] 23 cmH20  INTAKE / OUTPUT: Intake/Output     10/30 0701 - 10/31 0700 10/31 0701 - 11/01 0700   I.V. (mL/kg) 576.4 (4.4)    NG/GT 860    IV Piggyback 250    Total Intake(mL/kg) 1686.4 (12.8)    Urine (mL/kg/hr) 1905 (0.6)    Emesis/NG output     Stool     Total Output 1905     Net -218.6          Stool  Occurrence 1 x     PHYSICAL EXAMINATION: General:  Mechanically ventilated, synchronous, morbidly obese Neuro: Alert and interactive, follows commands. HEENT:  PERRL, OETT / OGT Cardiovascular:  RRR, no m/r/g Lungs:  Bilateral diminished air entry, no w/r/r Abdomen:  Soft, nontender, bowel sounds diminished Musculoskeletal:  Symmetric pitting edema, chronic stasis dermatitis Skin:  Intact  LABS:  Recent Labs Lab 04/30/13 1615  04/30/13 1700  04/30/13 1827 04/30/13 2200 04/30/13 2244  05/01/13 0430 05/01/13 0533 05/02/13 0430 05/02/13 0435 05/03/13 0439 05/03/13 0440  HGB 9.6*  --   --   --   --   --   --   --  9.4* 9.8* 9.6*  --   --  9.5*  WBC 11.5*  --   --   --   --   --   --   --  6.3 6.2 8.4  --   --  7.4  PLT 245  --   --   --   --   --   --   --  188 184 188  --   --  205  NA 137  --   --   --   --   --   --   --  139 139 140  --   --  142  K  3.1*  --   --   --   --   --   --   --  2.9* 3.0* 3.1*  --   --  3.1*  CL 80*  --   --   --   --   --   --   --  88* 88* 89*  --   --  92*  CO2 >45*  --   --   --   --   --   --   --  >45* >45* 45*  --   --  44*  GLUCOSE 145*  --   --   --   --   --   --   --  108* 108* 133*  --   --  112*  BUN 28*  --   --   --   --   --   --   --  30* 31* 32*  --   --  27*  CREATININE 1.21*  --   --   --   --   --   --   --  1.25* 1.26* 1.12*  --   --  0.83  CALCIUM 10.4  --   --   --   --   --   --   --  9.1 9.0 9.0  --   --  9.3  MG  --   --   --   --   --   --   --   < > 1.6 1.6 2.0  --   --  2.0  PHOS  --   --   --   --   --   --   --   < > 3.4 3.4 2.9  --   --  2.9  AST 31  --   --   --   --   --   --   --   --   --   --   --   --   --   ALT 14  --   --   --   --   --   --   --   --   --   --   --   --   --   ALKPHOS 54  --   --   --   --   --   --   --   --   --   --   --   --   --   BILITOT 0.3  --   --   --   --   --   --   --   --   --   --   --   --   --   PROT 7.5  --   --   --   --   --   --   --   --   --   --   --   --   --    ALBUMIN 3.7  --   --   --   --   --   --   --   --   --   --   --   --   --   APTT  --   --   --   --   --   --   --   --   --   --   --   --   --  31  INR  --   --   --   --   --   --   --   --   --   --   --   --   --  1.01  LATICACIDVEN  --   --  1.40  --   --   --   --   --   --   --   --   --   --   --   TROPONINI  --   --   --   --  <0.30  --   --   --   --   --   --   --   --   --   PROCALCITON  --   --   --   --   --  0.48  --   --  0.55  --  0.51  --   --   --   PHART  --   < >  --   < >  --   --  7.489*  --   --   --   --  7.426 7.431  --   PCO2ART  --   < >  --   < >  --   --  68.4*  --   --   --   --  74.1* 66.0*  --   PO2ART  --   < >  --   < >  --   --  117.0*  --   --   --   --  90.6 109.0*  --   < > = values in this interval not displayed.  Recent Labs Lab 05/02/13 1118 05/02/13 1555 05/02/13 1939 05/02/13 2347 05/03/13 0419  GLUCAP 147* 139* 110* 146* 105*   CXR:  10/28 >>>  Small lung volumes, no overt airspace disease, low ETT (taped at 24 cm), CVL in good position  ASSESSMENT / PLAN:  PULMONARY A: Acute on chronic respiratory failure.  Possible pneumonia (HCAP).  Rx induced hypoventilation.  OSA/OHS.  COPD without exacerbation. P:   - Trach today then hopefully advance to TC quickly. - D/C all psych medications (severe polypharmacy) and will need to be addressed prior to discharge. - Albuterol / Atrovent. - Hold Flonase, Synbicort, Spiriva.  CARDIOVASCULAR A: CAD.  Hypertension. Transient hypotension after procedural sedation / positive pressure ventilation. P:  - Goal MAP > 65. - Continue to hold  Norvasc, Clonidine, Imdur until trach is in and patient is less stimulated, BP is marginal at this point. - D/C neo.  RENAL A:  AKI.  Hypovolemia. Hypokalemia. P:   - Trend BMP. - PO K ordered. - Lasix as ordered. - KVO IVF. - Follow CVP.  GASTROINTESTINAL A:  Morbid obesity.  GERD. P:   - Protonix for GI Px. - Continue TF.  HEMATOLOGIC A:  Mild  anemia. P:  - Trend CBC. - Heparin for DVT Px.  INFECTIOUS A:  Suspected HCAP.  PCN allergy. P:   - Cultures and antibiotics as above.  ENDOCRINE  A:  Hyperglycemia. P:   - SSI. - CBGs.  NEUROLOGIC A:  Acute encephalopathy. P:   - Goal RASS 0. - Hold Abilify, Buspar, Depakote, Cymbalta, Gabapentin, Oxycodone, (the need for this needs to be addressed prior to discharge!!!) - this is the third admission since May with similar presentation and she is continued on the same outpatient regimen nevertheless).  - Propofol gtt until no longer  paralyzed then will d/c  I have personally obtained a history, examined the patient, evaluated laboratory and imaging results, formulated the assessment and plan and placed orders.  CRITICAL CARE:  The patient is critically ill with multiple organ systems failure and requires high complexity decision making for assessment and support, frequent evaluation and titration of therapies, application of advanced monitoring technologies and extensive interpretation of multiple databases. Critical Care Time devoted to patient care services described in this note is 35 minutes.   Koren Bound, MD Pulmonary and Critical Care Medicine [facility] Pager: [phone]  05/03/2013, 10:50 AM

## 2013-05-03 NOTE — Procedures (Signed)
Percutaneous Tracheostomy Placement  Consent from family, patient sedated, paralyzed and positioned.  Area cleaned, lidocaine/epi injected, skin incision done followed by blunt dissection.  Trachea felt then entered and catheter passed.  Visualized bronchoscopically.  Wire placed and catheter removed and visualized.  Airway crushed and dilated.  Tracheostomy placed and wire removed.  Good volume return on vent and visualized bronchoscopically well above carina.  Patient tolerated the procedure well without complications.  Alyson Reedy, M.D. [facility] Pulmonary/Critical Care Medicine. Pager: [phone]. After hours pager: [phone].

## 2013-05-03 NOTE — Procedures (Signed)
Bronchoscopy Procedure Note [name] [id number] 01/18/1951  Procedure: Bronchoscopy Indications: Diagnostic evaluation of the airways  Procedure Details Consent: Risks of procedure as well as the alternatives and risks of each were explained to the (patient/caregiver).  Consent for procedure obtained. Time Out: Verified patient identification, verified procedure, site/side was marked, verified correct patient position, special equipment/implants available, medications/allergies/relevent history reviewed, required imaging and test results available.  Performed  In preparation for procedure, patient was given 100% FiO2 and bronchoscope lubricated. Sedation: Benzodiazepines, Muscle relaxants, Etomidate and Short-acting barbiturates  Airway entered and the following bronchi were examined: Bronchi.   Backed up ett to 18 cm. Noted needle, white cath sheeth, wire, dilator and trach entry, no bleeding or posterior wall injury. Procedures performed: Brushings not performed Bronchoscope removed Placed through new trach wnl  Evaluation Hemodynamic Status: BP stable throughout; O2 sats: stable throughout Patient's Current Condition: stable Specimens:  None Complications: No apparent complications Patient did tolerate procedure well.   Forcucci, Courtney 05/03/2013  perfromed with above  [name]. [name] Alias, MD, FACP Pgr: [phone] [place] [facility]

## 2013-05-03 NOTE — Procedures (Signed)
Bedside Tracheostomy Insertion Procedure Note   Patient Details:   Name: [name] DOB: March 18, 1951 MRN: [id number]  Procedure: Tracheostomy  Pre Procedure Assessment: ET Tube Size:7.5 ET Tube secured at lip (cm):21 Bite block in place: Yes Breath Sounds: Clear  Post Procedure Assessment: BP 102/90  Pulse 80  Temp(Src) 99.3 F (37.4 C) (Core (Comment))  Resp 13  Ht 5' 4.17" (1.63 m)  Wt 291 lb 0.1 oz (132 kg)  BMI 49.68 kg/m2  SpO2 99% O2 sats: stable throughout Complications: No apparent complications Patient did tolerate procedure well Tracheostomy Brand:Shiley Tracheostomy Style:Cuffed Tracheostomy Size: 6.0 Tracheostomy Secured KVQ:[id number], velcro Tracheostomy Placement Confirmation:Trach cuff visualized and in place, cxr taken for placement    Readling, Tammie Ann 05/03/2013, 10:48 AM      Patient seen and examined, agree with above note.  I dictated the care and orders written for this patient under my direction.  Alyson Reedy, MD [phone]

## 2013-05-03 NOTE — Progress Notes (Addendum)
NUTRITION FOLLOW UP  Intervention:   1. Enteral nutrition; Decrease Vital HP to 25 mL/hr goal with Prostat 60 mL TID (6 packets daily) to provide 1514 kcal, 142g protein, 503 mL free water.   NUTRITION DIAGNOSIS:  Inadequate oral intake related to inability to eat as evidenced by vent, NPO.   Monitor:  1. Enteral nutrition; initiation with tolerance. Enteral nutrition to provide 60-70% of estimated calorie needs (22-25 kcals/kg ideal body weight) and 100% of estimated protein needs, based on ASPEN guidelines for permissive underfeeding in critically ill obese individuals.  2. Wt/wt change; monitor trends  Assessment:   Pt admitted from NH with AMS. Pt with h/o COPD, OSA, OHS, and on multiple psychoactive agents.   Pt s/p trach placement. Patient is currently intubated on ventilator support.  MV: 7.3 L/min Temp:Temp (24hrs), Avg:98.9 F (37.2 C), Min:97.9 F (36.6 C), Max:100 F (37.8 C)  Propofol:  11.9 mL/hr providing 314 kcal/d.    TF currently held for procedure. Recommend resume once appropriate.   Height: Ht Readings from Last 1 Encounters:  04/30/13 5' 4.17" (1.63 m)    Weight Status:   Wt Readings from Last 1 Encounters:  05/03/13 291 lb 0.1 oz (132 kg)    Re-estimated needs:  Kcal: 2137 Permissive underfeeding:  4540-9811 Protein: 136g Fluid: >2.0 L/day  Skin: non-pitting edema, intact  Diet Order: NPO   Intake/Output Summary (Last 24 hours) at 05/03/13 1156 Last data filed at 05/03/13 1100  Gross per 24 hour  Intake 1743.33 ml  Output   1795 ml  Net -51.67 ml    Last BM: 10/30   Labs:   Recent Labs Lab 05/01/13 0533 05/02/13 0430 05/03/13 0440  NA 139 140 142  K 3.0* 3.1* 3.1*  CL 88* 89* 92*  CO2 >45* 45* 44*  BUN 31* 32* 27*  CREATININE 1.26* 1.12* 0.83  CALCIUM 9.0 9.0 9.3  MG 1.6 2.0 2.0  PHOS 3.4 2.9 2.9  GLUCOSE 108* 133* 112*    CBG (last 3)   Recent Labs  05/02/13 1939 05/02/13 2347 05/03/13 0419  GLUCAP 110* 146*  105*    Scheduled Meds: . albuterol  6 puff Inhalation Q4H  . antiseptic oral rinse  1 application Mouth Rinse QID  . aztreonam  1 g Intravenous Q8H  . chlorhexidine  15 mL Mouth/Throat BID  . etomidate  40 mg Intravenous Once  . feeding supplement (PRO-STAT SUGAR FREE 64)  30 mL Oral BID  . fentaNYL      . fentaNYL  200 mcg Intravenous Once  . furosemide  20 mg Intravenous Q6H  . heparin subcutaneous  5,000 Units Subcutaneous Q8H  . insulin aspart  0-15 Units Subcutaneous Q4H  . ipratropium  6 puff Inhalation Q4H  . midazolam      . pantoprazole (PROTONIX) IV  40 mg Intravenous Q24H  . potassium chloride  40 mEq Per Tube TID  . vancomycin  500 mg Intravenous Q12H    Continuous Infusions: . sodium chloride 20 mL/hr at 05/03/13 0800  . feeding supplement (VITAL HIGH PROTEIN) 1,000 mL (05/02/13 1900)  . phenylephrine (NEO-SYNEPHRINE) Adult infusion    . propofol 15 mcg/kg/min (05/03/13 1100)    Loyce Dys, MS RD LDN Clinical Inpatient Dietitian Pager: [phone] Weekend/After hours pager: [phone]

## 2013-05-03 NOTE — Progress Notes (Signed)
eLink Physician-Brief Progress Note Patient Name: [name] DOB: [birth year] MRN: [id number]  Date of Service  05/03/2013   HPI/Events of Note     eICU Interventions  Resume home meds - abilify, buspirone & cymbalta Versed prn Will need NGT reinserted   Intervention Category Intermediate Interventions: Medication change / dose adjustment  [name],RAKESH V. 05/03/2013, 7:51 PM

## 2013-05-03 NOTE — Clinical Social Work Note (Signed)
RNCM informed CSW pt will be getting a trach today. CSW continues to follow case and coordinate with RNCM to facilitate discharge back to [facility].    Maryclare Labrador, MSW, [facility] Clinical Social Worker [phone]

## 2013-05-03 NOTE — Progress Notes (Signed)
eLink Physician-Brief Progress Note Patient Name: [name] DOB: 01-07-1951 MRN: [id number]  Date of Service  05/03/2013   HPI/Events of Note  Agitated pulling at trach and disconnecting from vent.  Mitts not working.   eICU Interventions  Plan:  Bilateral wrist restraints for patient safety   Intervention Category Minor Interventions: Agitation / anxiety - evaluation and management  Darrien Laakso 05/03/2013, 10:35 PM

## 2013-05-03 NOTE — Clinical Social Work Note (Signed)
Marylene Land, admissions coordinator from [facility], called CSW and asked for an update on pt. CSW explained that pt was put on a trach this morning at 11am, and that there is potential for discharge back to [facility] on Monday. CSW will keep facility and pt's family informed concerning pt's discharge.    Maryclare Labrador, MSW, [facility] Clinical Social Worker [phone]

## 2013-05-04 ENCOUNTER — Inpatient Hospital Stay (HOSPITAL_COMMUNITY): Payer: Medicaid Other

## 2013-05-04 DIAGNOSIS — I10 Essential (primary) hypertension: Secondary | ICD-10-CM

## 2013-05-04 DIAGNOSIS — F319 Bipolar disorder, unspecified: Secondary | ICD-10-CM

## 2013-05-04 LAB — BASIC METABOLIC PANEL
BUN: 21 mg/dL (ref 6–23)
CO2: 39 mEq/L — ABNORMAL HIGH (ref 19–32)
Calcium: 9.3 mg/dL (ref 8.4–10.5)
Creatinine, Ser: 0.81 mg/dL (ref 0.50–1.10)
GFR calc Af Amer: 88 mL/min — ABNORMAL LOW (ref >90)
GFR calc non Af Amer: 76 mL/min — ABNORMAL LOW (ref >90)
Sodium: 141 mEq/L (ref 135–145)

## 2013-05-04 LAB — GLUCOSE, CAPILLARY
Glucose-Capillary: 88 mg/dL (ref 70–99)
Glucose-Capillary: 102 mg/dL — ABNORMAL HIGH (ref 70–99)
Glucose-Capillary: 107 mg/dL — ABNORMAL HIGH (ref 70–99)
Glucose-Capillary: 117 mg/dL — ABNORMAL HIGH (ref 70–99)
Glucose-Capillary: 115 mg/dL — ABNORMAL HIGH (ref 70–99)

## 2013-05-04 LAB — CBC
HCT: 30.4 % — ABNORMAL LOW (ref 36.0–46.0)
MCH: 29.4 pg (ref 26.0–34.0)
MCV: 92.1 fL (ref 78.0–100.0)
Platelets: 229 10*3/uL (ref 150–400)
RBC: 3.3 MIL/uL — ABNORMAL LOW (ref 3.87–5.11)
RDW: 14.4 % (ref 11.5–15.5)

## 2013-05-04 LAB — MAGNESIUM: Magnesium: 1.7 mg/dL (ref 1.5–2.5)

## 2013-05-04 MED ORDER — AMLODIPINE BESYLATE 5 MG PO TABS
5.0000 mg | ORAL_TABLET | Freq: Every day | ORAL | Status: DC
  Administered 2013-05-04 – 2013-05-09 (×6): 5 mg via ORAL
  Filled 2013-05-04 (×6): qty 1

## 2013-05-04 MED ORDER — POTASSIUM CHLORIDE 10 MEQ/50ML IV SOLN
10.0000 meq | INTRAVENOUS | Status: AC
  Administered 2013-05-04 (×3): 10 meq via INTRAVENOUS
  Filled 2013-05-04 (×3): qty 50

## 2013-05-04 MED ORDER — ALBUTEROL SULFATE (5 MG/ML) 0.5% IN NEBU
2.5000 mg | INHALATION_SOLUTION | RESPIRATORY_TRACT | Status: DC
  Administered 2013-05-05 – 2013-05-08 (×19): 2.5 mg via RESPIRATORY_TRACT
  Filled 2013-05-04 (×20): qty 0.5

## 2013-05-04 MED ORDER — DULOXETINE HCL 60 MG PO CPEP
60.0000 mg | ORAL_CAPSULE | Freq: Two times a day (BID) | ORAL | Status: DC
  Administered 2013-05-04 – 2013-05-09 (×11): 60 mg via ORAL
  Filled 2013-05-04 (×12): qty 1

## 2013-05-04 MED ORDER — ISOSORBIDE MONONITRATE ER 30 MG PO TB24
30.0000 mg | ORAL_TABLET | Freq: Every day | ORAL | Status: DC
  Administered 2013-05-04 – 2013-05-09 (×6): 30 mg via ORAL
  Filled 2013-05-04 (×6): qty 1

## 2013-05-04 MED ORDER — IPRATROPIUM BROMIDE 0.02 % IN SOLN
0.5000 mg | RESPIRATORY_TRACT | Status: DC
  Administered 2013-05-05 (×3): 0.5 mg via RESPIRATORY_TRACT
  Filled 2013-05-04 (×3): qty 2.5

## 2013-05-04 NOTE — Progress Notes (Signed)
PULMONARY  / CRITICAL CARE MEDICINE  Name: [name] MRN: [id number] DOB: 28-Mar-1951    ADMISSION DATE:  04/30/2013 CONSULTATION DATE:  04/30/2013  REFERRING MD :  EDP PRIMARY SERVICE:  PCCM  CHIEF COMPLAINT:  Acute encephalopathy  BRIEF PATIENT DESCRIPTION: 62 yo NH resident with COPD, OSA, OHS (on BiPAP / oxygen) and on multiple psychoactive Rx sent to [facility] ED with acute encephalopathy.  Intubated for airway protection.  SIGNIFICANT EVENTS / STUDIES:  10/28  Intubated for airway protection (dificult intubation - large tongue, small oropharynx, anterior larynx, morbid obesity)  LINES / TUBES: OETT 10/28 >>>10/31 Trach (JY) 10/31>>> OGT 10/28 >>>10/31 NGT 10/31>>> Foley 10/28 >>> L [place] TLC 10/28 >>>  CULTURES: 10/28  Blood >>>NTD 10/28  Urine >>>NTD 10/28  Resp >>>Yeast  ANTIBIOTICS: Vancomycin 10/28 >>> 11/1 Aztreonam 10/28 >>>  INTERVAL HISTORY: pulled out NG tube.  VITAL SIGNS: Temp:  [98.4 F (36.9 C)-99.9 F (37.7 C)] 98.8 F (37.1 C) (11/01 0800) Pulse Rate:  [63-106] 79 (11/01 0759) Resp:  [13-24] 21 (11/01 0759) BP: (99-152)/(42-91) 152/58 mmHg (11/01 0700) SpO2:  [96 %-100 %] 100 % (11/01 0759) Arterial Line BP: (73-173)/(54-110) 155/56 mmHg (11/01 0700) FiO2 (%):  [40 %] 40 % (11/01 0759) Weight:  [128.5 kg (283 lb 4.7 oz)] 128.5 kg (283 lb 4.7 oz) (11/01 0500)  HEMODYNAMICS: CVP:  [11 mmHg-75 mmHg] 15 mmHg VENTILATOR SETTINGS: Vent Mode:  [-] PSV;CPAP FiO2 (%):  [40 %] 40 % Set Rate:  [12 bmp] 12 bmp Vt Set:  [430 mL] 430 mL PEEP:  [5 cmH20] 5 cmH20 Pressure Support:  [5 cmH20] 5 cmH20 Plateau Pressure:  [23 cmH20-25 cmH20] 23 cmH20  INTAKE / OUTPUT: Intake/Output     10/31 0701 - 11/01 0700 11/01 0701 - 11/02 0700   I.V. (mL/kg) 658.3 (5.1)    NG/GT     IV Piggyback 400    Total Intake(mL/kg) 1058.3 (8.2)    Urine (mL/kg/hr) 2945 (1)    Total Output 2945     Net -1886.7          Stool Occurrence 1 x     PHYSICAL  EXAMINATION:  Gen : awake, alert, comfortable HEENT: NCAT, Trache site c/d/i PULM: CTA B CV: distant, normal S1/S2 AB: BS+, soft, nontender Ext: chronic edema Neuro: Awake and alert, following commands, cooperative  LABS:  Recent Labs Lab 04/30/13 1615  04/30/13 1700  04/30/13 1827 04/30/13 2200 04/30/13 2244 05/01/13 0430  05/02/13 0430 05/02/13 0435 05/03/13 0439 05/03/13 0440 05/04/13 0615  HGB 9.6*  --   --   --   --   --   --  9.4*  < > 9.6*  --   --  9.5* 9.7*  WBC 11.5*  --   --   --   --   --   --  6.3  < > 8.4  --   --  7.4 9.2  PLT 245  --   --   --   --   --   --  188  < > 188  --   --  205 229  NA 137  --   --   --   --   --   --  139  < > 140  --   --  142 141  K 3.1*  --   --   --   --   --   --  2.9*  < > 3.1*  --   --  3.1* 3.2*  CL 80*  --   --   --   --   --   --  88*  < > 89*  --   --  92* 92*  CO2 >45*  --   --   --   --   --   --  >45*  < > 45*  --   --  44* 39*  GLUCOSE 145*  --   --   --   --   --   --  108*  < > 133*  --   --  112* 104*  BUN 28*  --   --   --   --   --   --  30*  < > 32*  --   --  27* 21  CREATININE 1.21*  --   --   --   --   --   --  1.25*  < > 1.12*  --   --  0.83 0.81  CALCIUM 10.4  --   --   --   --   --   --  9.1  < > 9.0  --   --  9.3 9.3  MG  --   --   --   --   --   --   --  1.6  < > 2.0  --   --  2.0 1.7  PHOS  --   --   --   --   --   --   --  3.4  < > 2.9  --   --  2.9 3.9  AST 31  --   --   --   --   --   --   --   --   --   --   --   --   --   ALT 14  --   --   --   --   --   --   --   --   --   --   --   --   --   ALKPHOS 54  --   --   --   --   --   --   --   --   --   --   --   --   --   BILITOT 0.3  --   --   --   --   --   --   --   --   --   --   --   --   --   PROT 7.5  --   --   --   --   --   --   --   --   --   --   --   --   --   ALBUMIN 3.7  --   --   --   --   --   --   --   --   --   --   --   --   --   APTT  --   --   --   --   --   --   --   --   --   --   --   --  31  --   INR  --   --   --   --   --    --   --   --   --   --   --   --  1.01  --   LATICACIDVEN  --   --  1.40  --   --   --   --   --   --   --   --   --   --   --   TROPONINI  --   --   --   --  <0.30  --   --   --   --   --   --   --   --   --   PROCALCITON  --   --   --   --   --  0.48  --  0.55  --  0.51  --   --   --   --   PHART  --   < >  --   < >  --   --  7.489*  --   --   --  7.426 7.431  --   --   PCO2ART  --   < >  --   < >  --   --  68.4*  --   --   --  74.1* 66.0*  --   --   PO2ART  --   < >  --   < >  --   --  117.0*  --   --   --  90.6 109.0*  --   --   < > = values in this interval not displayed.  Recent Labs Lab 05/03/13 1349 05/03/13 1614 05/03/13 2033 05/03/13 2332 05/04/13 0517  GLUCAP 92 88 91 105* 102*   CXR:  10/28 >>>  Small lung volumes, no overt airspace disease, low ETT (taped at 24 cm), CVL in good position  ASSESSMENT / PLAN:  PULMONARY A: Acute on chronic respiratory failure.  Possible pneumonia (HCAP).  Rx induced hypoventilation.  OSA/OHS.  COPD without exacerbation. P:   - ATC today - Albuterol / Atrovent. - Hold Flonase, Synbicort, Spiriva.  CARDIOVASCULAR A: CAD.  Hypertension.  P:  - Goal MAP > 65. - Continue to hold Clonidine - restart norvasc, imdur - D/C arterial line  RENAL A:  AKI.  Hypovolemia. Hypokalemia. P:   - Trend BMP. - K replacement ordered - KVO IVF. - Follow CVP.  GASTROINTESTINAL A:  Morbid obesity.  GERD. P:   - Protonix for GI Px. - Continue TF.  HEMATOLOGIC A:  Mild anemia. P:  - Trend CBC. - Heparin for DVT Px.  INFECTIOUS A:  Suspected HCAP.  PCN allergy. P:   - d/c vanc - cont aztreonam 7 days  ENDOCRINE  A:  Hyperglycemia. P:   - SSI. - CBGs.  NEUROLOGIC A:  Acute encephalopathy > agitation , pulling at tubes/lines P:   - Goal RASS 0. - restart Abilify, Buspar, duloxetine - continue to hold clonidine, Depakote,  Gabapentin, Oxycodone, (the need for this needs to be addressed prior to discharge!!!) - this is the third  admission since May with similar presentation and she is continued on the same outpatient regimen nevertheless).   Today's summary: ATC trials, restart some home meds, may need to replace NG if fails ATC  I have personally obtained a history, examined the patient, evaluated laboratory and imaging results, formulated the assessment and plan and placed orders.  CRITICAL CARE:  The patient is critically ill with multiple organ systems failure and requires high complexity decision making for assessment and support, frequent evaluation and titration of therapies, application of advanced monitoring technologies and extensive interpretation  of multiple databases. Critical Care Time devoted to patient care services described in this note is 35 minutes.   Max Fickle, MD Pulmonary and Critical Care Medicine [facility] Pager: [phone]  05/04/2013, 8:39 AM

## 2013-05-04 NOTE — Progress Notes (Signed)
[facility] ADULT ICU REPLACEMENT PROTOCOL FOR AM LAB REPLACEMENT ONLY  The patient does apply for the [facility] Adult ICU Electrolyte Replacment Protocol based on the criteria listed below:   1. Is GFR >/= 40 ml/min? yes  Patient's GFR today is 85 2. Is urine output >/= 0.5 ml/kg/hr for the last 6 hours? yes Patient's UOP is 0.7 ml/kg/hr 3. Is BUN < 60 mg/dL? yes  Patient's BUN today is 27 4. Abnormal electrolyte(s):K3.1 5. Ordered repletion with: 61meq/IVX4 6. If a panic level lab has been reported, has the CCM MD in charge been notified? yes.   Physician:  Holland Commons MD  [name] 05/04/2013 6:07 AM

## 2013-05-04 NOTE — Progress Notes (Signed)
ANTIBIOTIC CONSULT NOTE - FOLLOW UP  Pharmacy Consult for Aztreonam Indication: suspected PNA  Allergies  Allergen Reactions  . Penicillins Anaphylaxis    Tongue swelling  . Aspirin Other (See Comments)    Per MAR  . Bee Venom Other (See Comments)    Unknown  . Lisinopril     Per MAR  . Nyquil Cold & [Dm-Doxylamine-Acetaminophen] Other (See Comments)    Unknown   . Ppd [Tuberculin Purified Protein Derivative] Other (See Comments)    unknown    Patient Measurements: Height: 5' 4.17" (163 cm) Weight: 283 lb 4.7 oz (128.5 kg) IBW/kg (Calculated) : 55.1   Vital Signs: Temp: 98.8 F (37.1 C) (11/01 0800) Temp src: Oral (11/01 0800) BP: 152/58 mmHg (11/01 0700) Pulse Rate: 79 (11/01 0759) Intake/Output from previous day: 10/31 0701 - 11/01 0700 In: 1058.3 [I.V.:658.3; IV Piggyback:400] Out: 2945 [Urine:2945] Intake/Output from this shift:    Labs:  Recent Labs  05/02/13 0430 05/03/13 0440 05/04/13 0615  WBC 8.4 7.4 9.2  HGB 9.6* 9.5* 9.7*  PLT 188 205 229  CREATININE 1.12* 0.83 0.81   Estimated Creatinine Clearance: 94.8 ml/min (by C-G formula based on Cr of 0.81).  Recent Labs  05/02/13 0730  VANCOTROUGH 21.2*     Microbiology: Recent Results (from the past 720 hour(s))  CULTURE, BLOOD (ROUTINE X 2)     Status: None   Collection Time    04/30/13  4:15 PM      Result Value Range Status   Specimen Description BLOOD LEFT ARM   Final   Special Requests BOTTLES DRAWN AEROBIC ONLY   Final   Culture  Setup Time     Final   Value: 04/30/2013 21:38     Performed at [facility]   Culture     Final   Value:        BLOOD CULTURE RECEIVED NO GROWTH TO DATE CULTURE WILL BE HELD FOR 5 DAYS BEFORE ISSUING A FINAL NEGATIVE REPORT     Performed at [facility]   Report Status PENDING   Incomplete  CULTURE, BLOOD (ROUTINE X 2)     Status: None   Collection Time    04/30/13  4:50 PM      Result Value Range Status   Specimen Description  BLOOD LEFT HAND   Final   Special Requests BOTTLES DRAWN AEROBIC ONLY 3CC   Final   Culture  Setup Time     Final   Value: 04/30/2013 21:38     Performed at [facility]   Culture     Final   Value:        BLOOD CULTURE RECEIVED NO GROWTH TO DATE CULTURE WILL BE HELD FOR 5 DAYS BEFORE ISSUING A FINAL NEGATIVE REPORT     Performed at [facility]   Report Status PENDING   Incomplete  URINE CULTURE     Status: None   Collection Time    04/30/13  6:58 PM      Result Value Range Status   Specimen Description URINE, CATHETERIZED   Final   Special Requests NONE   Final   Culture  Setup Time     Final   Value: 05/01/2013 02:40     Performed at [name] Count     Final   Value: NO GROWTH     Performed at [facility]   Culture     Final   Value: NO  GROWTH     Performed at [facility]   Report Status 05/02/2013 FINAL   Final  CULTURE, RESPIRATORY (NON-EXPECTORATED)     Status: None   Collection Time    05/01/13  8:57 AM      Result Value Range Status   Specimen Description TRACHEAL ASPIRATE   Final   Special Requests NONE   Final   Gram Stain     Final   Value: MODERATE WBC PRESENT, PREDOMINANTLY PMN     RARE SQUAMOUS EPITHELIAL CELLS PRESENT     NO ORGANISMS SEEN     Performed at [facility]   Culture     Final   Value: MODERATE YEAST CONSISTENT WITH CANDIDA SPECIES     Performed at [facility]   Report Status 05/03/2013 FINAL   Final    Anti-infectives   Start     Dose/Rate Route Frequency Ordered Stop   05/02/13 2000  vancomycin (VANCOCIN) 500 mg in sodium chloride 0.9 % 100 mL IVPB  Status:  Discontinued     500 mg 100 mL/hr over 60 Minutes Intravenous Every 12 hours 05/02/13 0928 05/04/13 0848   05/01/13 0800  vancomycin (VANCOCIN) IVPB 750 mg/150 ml premix  Status:  Discontinued     750 mg 150 mL/hr over 60 Minutes Intravenous Every 12 hours 04/30/13 1807 05/02/13 0928   05/01/13 0100  aztreonam  (AZACTAM) 1 g in dextrose 5 % 50 mL IVPB     1 g 100 mL/hr over 30 Minutes Intravenous Every 8 hours 04/30/13 2142 05/07/13 2359   04/30/13 1645  vancomycin (VANCOCIN) 2,000 mg in sodium chloride 0.9 % 500 mL IVPB     2,000 mg 250 mL/hr over 120 Minutes Intravenous  Once 04/30/13 1637 04/30/13 2149   04/30/13 1630  aztreonam (AZACTAM) 2 g in dextrose 5 % 50 mL IVPB     2 g 100 mL/hr over 30 Minutes Intravenous  Once 04/30/13 1624 04/30/13 1828      Assessment: 62 YOF who remains ventilated and on antibiotics for suspected PNA. Vancomycin was d/c'd today by CCM. She has pen allergy and continues on Aztreonam with a stop date of 11/4 that has already been entered. Patient does have candida growing in TA culture. WBC wnl and patient remains afebrile. SCr is improved and current CrCl is ~11mL/min.  Goal of Therapy:  Eradication of infection  Plan:  1. Continue aztreonam 1g IV q8h until 11/4 as ordered 2. Follow up ability to extubate, clinical progression, any other need for antibiotics  Sorrel Cassetta D. Wallace Cogliano, PharmD Clinical Pharmacist Pager: [phone] 05/04/2013 9:15 AM

## 2013-05-04 NOTE — Progress Notes (Signed)
Pt placed on 35% ATC to wean.  Pt suctioned prior to being taken from mechanical ventilator support to ATC.  VS stable with SpO2 noted to be 94-96%.

## 2013-05-05 ENCOUNTER — Inpatient Hospital Stay (HOSPITAL_COMMUNITY): Payer: Medicaid Other

## 2013-05-05 DIAGNOSIS — R1310 Dysphagia, unspecified: Secondary | ICD-10-CM

## 2013-05-05 LAB — BASIC METABOLIC PANEL
BUN: 21 mg/dL (ref 6–23)
CO2: 39 mEq/L — ABNORMAL HIGH (ref 19–32)
Calcium: 9.8 mg/dL (ref 8.4–10.5)
Creatinine, Ser: 0.75 mg/dL (ref 0.50–1.10)
GFR calc non Af Amer: 88 mL/min — ABNORMAL LOW (ref >90)
Glucose, Bld: 116 mg/dL — ABNORMAL HIGH (ref 70–99)
Sodium: 143 mEq/L (ref 135–145)

## 2013-05-05 LAB — GLUCOSE, CAPILLARY
Glucose-Capillary: 122 mg/dL — ABNORMAL HIGH (ref 70–99)
Glucose-Capillary: 116 mg/dL — ABNORMAL HIGH (ref 70–99)

## 2013-05-05 MED ORDER — BUDESONIDE-FORMOTEROL FUMARATE 160-4.5 MCG/ACT IN AERO
2.0000 | INHALATION_SPRAY | Freq: Two times a day (BID) | RESPIRATORY_TRACT | Status: DC
  Administered 2013-05-05 – 2013-05-07 (×4): 2 via RESPIRATORY_TRACT
  Filled 2013-05-05 (×3): qty 6

## 2013-05-05 MED ORDER — FLUTICASONE PROPIONATE 50 MCG/ACT NA SUSP
2.0000 | Freq: Every day | NASAL | Status: DC
  Administered 2013-05-05 – 2013-05-09 (×4): 2 via NASAL
  Filled 2013-05-05 (×2): qty 16

## 2013-05-05 MED ORDER — POTASSIUM CHLORIDE 20 MEQ/15ML (10%) PO LIQD
30.0000 meq | ORAL | Status: AC
  Administered 2013-05-05 (×2): 30 meq
  Filled 2013-05-05 (×2): qty 22.5

## 2013-05-05 MED ORDER — TIOTROPIUM BROMIDE MONOHYDRATE 18 MCG IN CAPS
18.0000 ug | ORAL_CAPSULE | Freq: Every day | RESPIRATORY_TRACT | Status: DC
  Filled 2013-05-05 (×3): qty 5

## 2013-05-05 NOTE — Progress Notes (Addendum)
PULMONARY  / CRITICAL CARE MEDICINE  Name: [name] MRN: [id number] DOB: May 28, 1951    ADMISSION DATE:  04/30/2013 CONSULTATION DATE:  04/30/2013  REFERRING MD :  EDP PRIMARY SERVICE:  PCCM  CHIEF COMPLAINT:  Acute encephalopathy  BRIEF PATIENT DESCRIPTION: 62 yo NH resident with COPD, OSA, OHS (on BiPAP / oxygen) and on multiple psychoactive Rx sent to [facility] ED with acute encephalopathy.  Intubated for airway protection.  SIGNIFICANT EVENTS / STUDIES:  10/28  Intubated for airway protection (dificult intubation - large tongue, small oropharynx, anterior larynx, morbid obesity)  LINES / TUBES: OETT 10/28 >>>10/31 Trach (JY) 10/31>>> OGT 10/28 >>>10/31 NGT 10/31>>> Foley 10/28 >>> L  TLC 10/28 >>>  CULTURES: 10/28  Blood >>>NTD 10/28  Urine >>>NTD 10/28  Resp >>>Yeast  ANTIBIOTICS: Vancomycin 10/28 >>> 11/1 Aztreonam 10/28 >>>  INTERVAL HISTORY: pulled out NG tube again, ATC all night last night  VITAL SIGNS: Temp:  [98.8 F (37.1 C)-100.9 F (38.3 C)] 100 F (37.8 C) (11/02 0800) Pulse Rate:  [37-128] 92 (11/02 0737) Resp:  [16-28] 26 (11/02 0737) BP: (113-198)/(42-120) 174/59 mmHg (11/02 0700) SpO2:  [86 %-100 %] 95 % (11/02 0737) Arterial Line BP: (128-150)/(52-68) 150/68 mmHg (11/01 1700) FiO2 (%):  [28 %-35 %] 28 % (11/02 0737) Weight:  [127.2 kg (280 lb 6.8 oz)] 127.2 kg (280 lb 6.8 oz) (11/02 0418)  HEMODYNAMICS:   VENTILATOR SETTINGS: Vent Mode:  [-]  FiO2 (%):  [28 %-35 %] 28 %  INTAKE / OUTPUT: Intake/Output     11/01 0701 - 11/02 0700 11/02 0701 - 11/03 0700   I.V. (mL/kg) 460 (3.6) 60 (0.5)   NG/GT 150    IV Piggyback 150    Total Intake(mL/kg) 760 (6) 60 (0.5)   Urine (mL/kg/hr) 2175 (0.7) 225 (0.5)   Total Output 2175 225   Net -1415 -165         PHYSICAL EXAMINATION:  Gen : awake, alert, comfortable on ATC HEENT: NCAT, Trache site c/d/i PULM: CTA B CV: distant, normal S1/S2 AB: BS+, soft, nontender Ext: chronic  edema Neuro: Awake and alert, following commands, cooperative  LABS:  Recent Labs Lab 04/30/13 1615  04/30/13 1700  04/30/13 1827 04/30/13 2200 04/30/13 2244 05/01/13 0430  05/02/13 0430 05/02/13 0435 05/03/13 0439 05/03/13 0440 05/04/13 0615 05/05/13 0430  HGB 9.6*  --   --   --   --   --   --  9.4*  < > 9.6*  --   --  9.5* 9.7*  --   WBC 11.5*  --   --   --   --   --   --  6.3  < > 8.4  --   --  7.4 9.2  --   PLT 245  --   --   --   --   --   --  188  < > 188  --   --  205 229  --   NA 137  --   --   --   --   --   --  139  < > 140  --   --  142 141 143  K 3.1*  --   --   --   --   --   --  2.9*  < > 3.1*  --   --  3.1* 3.2* 3.1*  CL 80*  --   --   --   --   --   --  88*  < > 89*  --   --  92* 92* 95*  CO2 >45*  --   --   --   --   --   --  >45*  < > 45*  --   --  44* 39* 39*  GLUCOSE 145*  --   --   --   --   --   --  108*  < > 133*  --   --  112* 104* 116*  BUN 28*  --   --   --   --   --   --  30*  < > 32*  --   --  27* 21 21  CREATININE 1.21*  --   --   --   --   --   --  1.25*  < > 1.12*  --   --  0.83 0.81 0.75  CALCIUM 10.4  --   --   --   --   --   --  9.1  < > 9.0  --   --  9.3 9.3 9.8  MG  --   --   --   --   --   --   --  1.6  < > 2.0  --   --  2.0 1.7  --   PHOS  --   --   --   --   --   --   --  3.4  < > 2.9  --   --  2.9 3.9  --   AST 31  --   --   --   --   --   --   --   --   --   --   --   --   --   --   ALT 14  --   --   --   --   --   --   --   --   --   --   --   --   --   --   ALKPHOS 54  --   --   --   --   --   --   --   --   --   --   --   --   --   --   BILITOT 0.3  --   --   --   --   --   --   --   --   --   --   --   --   --   --   PROT 7.5  --   --   --   --   --   --   --   --   --   --   --   --   --   --   ALBUMIN 3.7  --   --   --   --   --   --   --   --   --   --   --   --   --   --   APTT  --   --   --   --   --   --   --   --   --   --   --   --  31  --   --   INR  --   --   --   --   --   --   --   --   --   --   --   --  1.01  --   --    LATICACIDVEN  --   --  1.40  --   --   --   --   --   --   --   --   --   --   --   --   TROPONINI  --   --   --   --  <0.30  --   --   --   --   --   --   --   --   --   --   PROCALCITON  --   --   --   --   --  0.48  --  0.55  --  0.51  --   --   --   --   --   PHART  --   < >  --   < >  --   --  7.489*  --   --   --  7.426 7.431  --   --   --   PCO2ART  --   < >  --   < >  --   --  68.4*  --   --   --  74.1* 66.0*  --   --   --   PO2ART  --   < >  --   < >  --   --  117.0*  --   --   --  90.6 109.0*  --   --   --   < > = values in this interval not displayed.  Recent Labs Lab 05/04/13 0806 05/04/13 1728 05/04/13 2001 05/04/13 2321 05/05/13 0413  GLUCAP 107* 117* 115* 118* 122*   CXR:  10/28 >>>  Small lung volumes, no overt airspace disease, low ETT (taped at 24 cm), CVL in good position  ASSESSMENT / PLAN:  PULMONARY A: Acute on chronic respiratory failure.   Possible pneumonia (HCAP).   Rx induced hypoventilation> resolved OSA/OHS.   COPD without exacerbation. P:   - ATC 24 hours - Albuterol  - restart Flonase, Synbicort, Spiriva.  CARDIOVASCULAR A: CAD.  Hypertension.  P:  - Goal MAP > 65. - Continue to hold Clonidine - continue norvasc, imdur - D/C arterial line  RENAL A:  AKI.  Hypovolemia. Hypokalemia. P:   - Trend BMP. - K replacement ordered - KVO IVF. - Follow CVP.  GASTROINTESTINAL A:  Morbid obesity.  GERD. P:   - Protonix for GI Px. - speech eval today - TF if cannot eat.  HEMATOLOGIC A:  Mild anemia. P:  - Trend CBC. - Heparin for DVT Px.  INFECTIOUS A:  Suspected HCAP.  PCN allergy. P:   - d/c vanc - cont aztreonam 7 days  ENDOCRINE  A:  Hyperglycemia. P:   - SSI. - CBGs.  NEUROLOGIC A:  Acute encephalopathy > agitation , pulling at tubes/lines P:   - Goal RASS 0. - restart Abilify, Buspar, duloxetine - continue to hold clonidine, Depakote,  Gabapentin, Oxycodone, (the need for this needs to be addressed prior to  discharge!!!) - this is the third admission since May with similar presentation and she is continued on the same outpatient regimen nevertheless).   Today's summary: ATC trials, restart some home meds, may need to replace NG if fails ATC  To SDU 11/2, PCCM service   Max Fickle, MD Pulmonary and Critical Care Medicine [facility] Pager: [phone]  05/05/2013, 10:14 AM

## 2013-05-05 NOTE — Progress Notes (Signed)
SLP Cancellation Note  Patient Details Name: [name] MRN: [id number] DOB: 05/05/1951   Cancelled treatment:  ST received order for BSE.  Evaluation deferred to 05/06/13. Moreen Fowler MS, CCC-SLP [phone] [facility] 05/05/2013, 4:50 PM

## 2013-05-05 NOTE — Progress Notes (Signed)
[facility] ADULT ICU REPLACEMENT PROTOCOL FOR AM LAB REPLACEMENT ONLY  The patient does apply for the [facility] Adult ICU Electrolyte Replacment Protocol based on the criteria listed below:   1. Is GFR >/= 40 ml/min? yes  Patient's GFR today is >90 2. Is urine output >/= 0.5 ml/kg/hr for the last 6 hours? yes Patient's UOP is 0.6 ml/kg/hr 3. Is BUN < 60 mg/dL? yes  Patient's BUN today is 21 4. Abnormal electrolyte(s): K3.1 5. Ordered repletion with: KCL/tube 6. If a panic level lab has been reported, has the CCM MD in charge been notified? yes.   Physician:  Wilford Sports MD  Melrose Nakayama 05/05/2013 6:50 AM

## 2013-05-06 LAB — GLUCOSE, CAPILLARY
Glucose-Capillary: 104 mg/dL — ABNORMAL HIGH (ref 70–99)
Glucose-Capillary: 107 mg/dL — ABNORMAL HIGH (ref 70–99)
Glucose-Capillary: 107 mg/dL — ABNORMAL HIGH (ref 70–99)
Glucose-Capillary: 129 mg/dL — ABNORMAL HIGH (ref 70–99)
Glucose-Capillary: 106 mg/dL — ABNORMAL HIGH (ref 70–99)
Glucose-Capillary: 111 mg/dL — ABNORMAL HIGH (ref 70–99)
Glucose-Capillary: 124 mg/dL — ABNORMAL HIGH (ref 70–99)

## 2013-05-06 LAB — CULTURE, BLOOD (ROUTINE X 2): Culture: NO GROWTH

## 2013-05-06 LAB — BASIC METABOLIC PANEL
CO2: 41 mEq/L (ref 19–32)
Chloride: 99 mEq/L (ref 96–112)
Creatinine, Ser: 0.69 mg/dL (ref 0.50–1.10)
GFR calc Af Amer: >90 mL/min (ref >90)
GFR calc non Af Amer: >90 mL/min (ref >90)
Sodium: 146 mEq/L — ABNORMAL HIGH (ref 135–145)

## 2013-05-06 MED ORDER — CLONIDINE HCL 0.2 MG PO TABS
0.2000 mg | ORAL_TABLET | Freq: Two times a day (BID) | ORAL | Status: DC
  Administered 2013-05-06 – 2013-05-09 (×6): 0.2 mg via ORAL
  Filled 2013-05-06 (×7): qty 1

## 2013-05-06 MED ORDER — POTASSIUM CHLORIDE 20 MEQ/15ML (10%) PO LIQD
40.0000 meq | Freq: Once | ORAL | Status: DC
  Filled 2013-05-06: qty 30

## 2013-05-06 NOTE — Progress Notes (Signed)
PULMONARY  / CRITICAL CARE MEDICINE  Name: [name] MRN: [id number] DOB: Sep 27, 1950    ADMISSION DATE:  04/30/2013 CONSULTATION DATE:  04/30/2013  REFERRING MD :  EDP PRIMARY SERVICE:  PCCM  CHIEF COMPLAINT:  Acute encephalopathy  BRIEF PATIENT DESCRIPTION: 62 yo NH resident with COPD, OSA, OHS (on BiPAP / oxygen) and on multiple psychoactive Rx sent to [facility] ED with acute encephalopathy.  Intubated for airway protection.  SIGNIFICANT EVENTS / STUDIES:  10/28  Intubated for airway protection (dificult intubation - large tongue, small oropharynx, anterior larynx, morbid obesity)  LINES / TUBES: OETT 10/28 >>>10/31 Trach (JY) 10/31>>> OGT 10/28 >>>10/31 NGT 10/31>>> Foley 10/28 >>> L IXL TLC 10/28 >>>  CULTURES: 10/28  Blood >>>NTD 10/28  Urine >>>NTD 10/28  Resp >>>Yeast  ANTIBIOTICS: Vancomycin 10/28 >>> 11/1 Aztreonam 10/28 >>>  INTERVAL HISTORY: Tolerates ATC  No chest pain or dyspnea  VITAL SIGNS: Temp:  [97.5 F (36.4 C)-99.7 F (37.6 C)] 97.5 F (36.4 C) (11/03 1200) Pulse Rate:  [86-102] 96 (11/03 1400) Resp:  [17-31] 25 (11/03 1400) BP: (136-209)/(70-105) 152/104 mmHg (11/03 1400) SpO2:  [95 %-100 %] 99 % (11/03 1400) FiO2 (%):  [28 %] 28 % (11/03 1200) Weight:  [129 kg (284 lb 6.3 oz)] 129 kg (284 lb 6.3 oz) (11/03 0500)  HEMODYNAMICS:   VENTILATOR SETTINGS: Vent Mode:  [-]  FiO2 (%):  [28 %] 28 %  INTAKE / OUTPUT: Intake/Output     11/02 0701 - 11/03 0700 11/03 0701 - 11/04 0700   I.V. (mL/kg) 500 (3.9) 120 (0.9)   NG/GT 525 370   IV Piggyback 150 50   Total Intake(mL/kg) 1175 (9.1) 540 (4.2)   Urine (mL/kg/hr) 1225 (0.4) 100 (0.1)   Total Output 1225 100   Net -50 +440        Urine Occurrence 5 x 1 x   Stool Occurrence 2 x 1 x    PHYSICAL EXAMINATION:  Gen : awake, alert, comfortable on ATC HEENT: NCAT, Trache site c/d/i PULM: CTA B CV: distant, normal S1/S2 AB: BS+, soft, nontender Ext: chronic edema Neuro: Awake and alert,  following commands, cooperative  LABS:  Recent Labs Lab 04/30/13 1615  04/30/13 1700  04/30/13 1827 04/30/13 2200 04/30/13 2244 05/01/13 0430  05/02/13 0430 05/02/13 0435 05/03/13 0439 05/03/13 0440 05/04/13 0615 05/05/13 0430 05/06/13 0500  HGB 9.6*  --   --   --   --   --   --  9.4*  < > 9.6*  --   --  9.5* 9.7*  --   --   WBC 11.5*  --   --   --   --   --   --  6.3  < > 8.4  --   --  7.4 9.2  --   --   PLT 245  --   --   --   --   --   --  188  < > 188  --   --  205 229  --   --   NA 137  --   --   --   --   --   --  139  < > 140  --   --  142 141 143 146*  K 3.1*  --   --   --   --   --   --  2.9*  < > 3.1*  --   --  3.1* 3.2* 3.1* 3.2*  CL 80*  --   --   --   --   --   --  88*  < > 89*  --   --  92* 92* 95* 99  CO2 >45*  --   --   --   --   --   --  >45*  < > 45*  --   --  44* 39* 39* 41*  GLUCOSE 145*  --   --   --   --   --   --  108*  < > 133*  --   --  112* 104* 116* 123*  BUN 28*  --   --   --   --   --   --  30*  < > 32*  --   --  27* 21 21 23   CREATININE 1.21*  --   --   --   --   --   --  1.25*  < > 1.12*  --   --  0.83 0.81 0.75 0.69  CALCIUM 10.4  --   --   --   --   --   --  9.1  < > 9.0  --   --  9.3 9.3 9.8 9.4  MG  --   --   --   --   --   --   --  1.6  < > 2.0  --   --  2.0 1.7  --   --   PHOS  --   --   --   --   --   --   --  3.4  < > 2.9  --   --  2.9 3.9  --   --   AST 31  --   --   --   --   --   --   --   --   --   --   --   --   --   --   --   ALT 14  --   --   --   --   --   --   --   --   --   --   --   --   --   --   --   ALKPHOS 54  --   --   --   --   --   --   --   --   --   --   --   --   --   --   --   BILITOT 0.3  --   --   --   --   --   --   --   --   --   --   --   --   --   --   --   PROT 7.5  --   --   --   --   --   --   --   --   --   --   --   --   --   --   --   ALBUMIN 3.7  --   --   --   --   --   --   --   --   --   --   --   --   --   --   --   APTT  --   --   --   --   --   --   --   --   --   --   --   --  31  --   --   --    INR  --   --   --   --   --   --   --   --   --   --   --   --  1.01  --   --   --   LATICACIDVEN  --   --  1.40  --   --   --   --   --   --   --   --   --   --   --   --   --   TROPONINI  --   --   --   --  <0.30  --   --   --   --   --   --   --   --   --   --   --   PROCALCITON  --   --   --   --   --  0.48  --  0.55  --  0.51  --   --   --   --   --   --   PHART  --   < >  --   < >  --   --  7.489*  --   --   --  7.426 7.431  --   --   --   --   PCO2ART  --   < >  --   < >  --   --  68.4*  --   --   --  74.1* 66.0*  --   --   --   --   PO2ART  --   < >  --   < >  --   --  117.0*  --   --   --  90.6 109.0*  --   --   --   --   < > = values in this interval not displayed.  Recent Labs Lab 05/05/13 1747 05/05/13 1952 05/06/13 0015 05/06/13 0404 05/06/13 0753  GLUCAP 104* 110* 107* 129* 106*   CXR: 11/2 low volumes  ASSESSMENT / PLAN:  PULMONARY A: Acute on chronic respiratory failure.   Possible pneumonia (HCAP).   Rx induced hypoventilation> resolved OSA/OHS.   COPD without exacerbation. P:   - ATC 24 hours - Albuterol  - restart Flonase, Synbicort, Spiriva.  CARDIOVASCULAR A: CAD.  Hypertension.  P:  - Goal MAP > 65. - resume Clonidine - continue norvasc, imdur - D/C arterial line  RENAL A:  AKI.  Hypovolemia. Hypokalemia. P:   - Trend BMP. - K replaced   GASTROINTESTINAL A:  Morbid obesity.  GERD. P:   - Protonix for GI Px. - advance per speech eval   HEMATOLOGIC A:  Mild anemia. P:  - Trend CBC. - Heparin for DVT Px.  INFECTIOUS A:  Suspected HCAP.  PCN allergy. P:   - d/c vanc - cont aztreonam 7 days  ENDOCRINE  A:  Hyperglycemia. P:   - SSI. - CBGs.  NEUROLOGIC A:  Acute encephalopathy > agitation , pulling at tubes/lines P:   - restart Abilify, Buspar, duloxetine - continue to hold  Depakote,  Gabapentin, Oxycodone, (the need for this needs to be addressed prior to discharge!!!) - this is the third admission since May with similar  presentation and she is continued on the same outpatient regimen nevertheless).   Today's summary: ATC trials, restart some home meds  To  SDU  Oretha Milch., MD Pulmonary and Critical Care Medicine [facility] Pager: [phone]   05/06/2013, 3:45 PM

## 2013-05-06 NOTE — Clinical Social Work Note (Signed)
CSW received a call from [place], admissions rep from [facility]. [name] requested an update on pt, and CSW explained that she was unable to see pt today but that she is not discharging yet. CSW will speak with [facility] tomorrow and will update [name] tomorrow [phone]).    [name], MSW, [facility] Clinical Social Worker [phone]

## 2013-05-06 NOTE — Progress Notes (Signed)
[facility] ADULT ICU REPLACEMENT PROTOCOL FOR AM LAB REPLACEMENT ONLY  The patient does not apply for the [facility] Adult ICU Electrolyte Replacment Protocol based on the criteria listed below:     Is urine output >/= 0.5 ml/kg/hr for the last 6 hours? no Patient's UOP is 0.1 ml/kg/hr * 4. Abnormal electrolyte(s): K3.2 6. If a panic level lab has been reported, has the CCM MD in charge been notified? yes.   Physician:  Holland Commons MD  Melrose Nakayama 05/06/2013 6:00 AM

## 2013-05-06 NOTE — Evaluation (Signed)
[facility] Speaking [facility] - Evaluation Patient Details  Name: [name] MRN: [id number] Date of Birth: 28-Nov-1950  Today's Date: 05/06/2013 Time: 1026-1105 SLP Time Calculation (min): 39 min  Past Medical History:  Past Medical History  Diagnosis Date  . COPD   . Hypertension   . Chronic respiratory failure     3 L oxygen  . GERD (gastroesophageal reflux disease)   . Diabetes mellitus   . Obesity, Class III, BMI 40-49.9 (morbid obesity)   . Obstructive sleep apnea on CPAP   . Peripheral vascular disease   . Bipolar disease, chronic   . Lymphedema   . COPD (chronic obstructive pulmonary disease)   . Cellulitis   . Coronary artery disease   . Asthma   . Myocardial infarction   . Vertigo    Past Surgical History:  Past Surgical History  Procedure Laterality Date  . Abdominal hysterectomy    . Cholecystectomy    . Appendectomy    . Tubal ligation     HPI:   62 year old female with history of COPD, obesity hypoventilation and multiple previous episodes of respiratory failure who was found unresponsive by SNF staff and EMS was called.  When EMS arrived the patient was altered but was able to protect her airway.  Patient was placed on 100% NRB and brought to the ER where she was placed on BiPAP and showed signs of improvement   Assessment / Plan / Recommendation Clinical Impression  Pt. tolerated PMSV well, with all VSS.  Initially pt. had very  hoarse vocal quality, but after coughing and clearing her throat, and expelling secretions orally, voice quality was muich clearer.  Sats increased to 100% and remained there throughout the session.  Pt. was oriented to person, place, and time (even date), and responded appropriately to questions asked, and conversed intelligibly with SLP.  Pt. asked to keep the PMSV on after the session was over, stating, "I want to keep my voice."  RN notified that PMSV was still in place and agreed to monitor and remove if pt. goes to sleep.     SLP Assessment  Patient needs continued Speech Lanaguage Pathology Services    Follow Up Recommendations  Skilled Nursing facility;24 hour supervision/assistance    Frequency and Duration min 3x week  2 weeks   Pertinent Vitals/Pain Baseline SpO2  93%; HR 79; RR 22    SLP Goals Potential to Achieve Goals: Good   PMSV Trial  PMSV was placed for: 1.5 hrs. Able to redirect subglottic air through upper airway: Yes Able to Attain Phonation: Yes Voice Quality: Hoarse;Low vocal intensity Able to Expectorate Secretions: Yes Level of Secretion Expectoration with PMSV: Oral Breath Support for Phonation: Adequate Respirations During Trial: 19 SpO2 During Trial: 100 % Pulse During Trial: 91 Behavior: Alert;Expresses self well;Good eye contact;Responsive to questions;Cooperative   Tracheostomy Tube  Additional Tracheostomy Tube Assessment Trach Collar Period: 3 days Secretion Description: thin, scant Level of Secretion Expectoration: Tracheal    Vent Dependency  FiO2 (%): 28 %    Cuff Deflation Trial Tolerated Cuff Deflation: Yes Length of Time for Cuff Deflation Trial: 5 minutes Behavior: Alert;Cooperative;Good eye contact;Smiling;Responsive to questions   Maryjo Rochester T 05/06/2013, 11:45 AM

## 2013-05-06 NOTE — Evaluation (Signed)
Clinical/Bedside Swallow Evaluation Patient Details  Name: [name] MRN: [id number] Date of Birth: 22-Feb-1951  Today's Date: 05/06/2013 Time: 1106-1140 SLP Time Calculation (min): 34 min  Past Medical History:  Past Medical History  Diagnosis Date  . COPD   . Hypertension   . Chronic respiratory failure     3 L oxygen  . GERD (gastroesophageal reflux disease)   . Diabetes mellitus   . Obesity, Class III, BMI 40-49.9 (morbid obesity)   . Obstructive sleep apnea on CPAP   . Peripheral vascular disease   . Bipolar disease, chronic   . Lymphedema   . COPD (chronic obstructive pulmonary disease)   . Cellulitis   . Coronary artery disease   . Asthma   . Myocardial infarction   . Vertigo    Past Surgical History:  Past Surgical History  Procedure Laterality Date  . Abdominal hysterectomy    . Cholecystectomy    . Appendectomy    . Tubal ligation     HPI:   62 year old female with history of COPD, obesity hypoventilation and multiple previous episodes of respiratory failure who was found unresponsive by SNF staff and EMS was called.  When EMS arrived the patient was altered but was able to protect her airway.  Patient was placed on 100% NRB and brought to the ER where she was placed on BiPAP and showed signs of improvement   Assessment / Plan / Recommendation Clinical Impression  Pt. appears to swallow well at bedside, however, pt,. has a right lower lobe infiltrate.  In light of this, and presence of trach (with cuff), an objective evaluation is recommended prior to initiation of diet.  Due to body habitus, a FEES is recommended.  Pt. may have a few ice chips after oral care is completed, with PMSV in place, if MD agrees.    Aspiration Risk  Mild    Diet Recommendation NPO;Ice chips PRN after oral care   Medication Administration: Via alternative means Postural Changes and/or Swallow Maneuvers: Seated upright 90 degrees    Other  Recommendations Recommended  Consults: FEES Oral Care Recommendations: Oral care Q4 per protocol Other Recommendations: Place PMSV during PO intake   Follow Up Recommendations  Skilled Nursing facility;24 hour supervision/assistance    Frequency and Duration min 3x week      Pertinent Vitals/Pain Right lower lobe infiltrate.    SLP Swallow Goals  Defer until FEES completed   Swallow Study Prior Functional Status       General HPI:  62 year old female with history of COPD, obesity hypoventilation and multiple previous episodes of respiratory failure who was found unresponsive by SNF staff and EMS was called.  When EMS arrived the patient was altered but was able to protect her airway.  Patient was placed on 100% NRB and brought to the ER where she was placed on BiPAP and showed signs of improvement Type of Study: Bedside swallow evaluation Previous Swallow Assessment: None found Diet Prior to this Study: NPO;Panda Temperature Spikes Noted: No Respiratory Status: Trach collar Trach Size and Type: #6;Cuff;With PMSV in place History of Recent Intubation: Yes Length of Intubations (days): 3 days Date extubated: 05/03/13 Behavior/Cognition: Alert;Cooperative Oral Cavity - Dentition: Edentulous;Dentures, not available Self-Feeding Abilities: Needs set up;Needs assist Patient Positioning: Upright in bed Baseline Vocal Quality: Clear;Low vocal intensity Volitional Cough: Strong Volitional Swallow: Able to elicit    Oral/Motor/Sensory Function Overall Oral Motor/Sensory Function: Appears within functional limits for tasks assessed   Ice  Chips Ice chips: Within functional limits Presentation: Spoon   Thin Liquid Thin Liquid: Not tested    Nectar Thick Nectar Thick Liquid: Not tested   Honey Thick Honey Thick Liquid: Not tested   Puree Puree: Impaired Presentation: Spoon Pharyngeal Phase Impairments: Multiple swallows   Solid   GO    Solid: Not tested       Maryjo Rochester T 05/06/2013,1:29 PM

## 2013-05-06 NOTE — Progress Notes (Signed)
Spoke with Dr. Arlyn Leak regarding  Low potassium and  High CO2.

## 2013-05-06 NOTE — Progress Notes (Signed)
eLink Physician-Brief Progress Note Patient Name: [name] DOB: 1950-11-02 MRN: [id number]  Date of Service  05/06/2013   HPI/Events of Note  Hypokalemia   eICU Interventions  Potassium replaced   Intervention Category Minor Interventions: Electrolytes abnormality - evaluation and management  Erich Kochan 05/06/2013, 5:50 AM

## 2013-05-07 ENCOUNTER — Encounter (HOSPITAL_COMMUNITY): Payer: Self-pay

## 2013-05-07 LAB — GLUCOSE, CAPILLARY
Glucose-Capillary: 115 mg/dL — ABNORMAL HIGH (ref 70–99)
Glucose-Capillary: 115 mg/dL — ABNORMAL HIGH (ref 70–99)
Glucose-Capillary: 122 mg/dL — ABNORMAL HIGH (ref 70–99)
Glucose-Capillary: 85 mg/dL (ref 70–99)
Glucose-Capillary: 109 mg/dL — ABNORMAL HIGH (ref 70–99)

## 2013-05-07 MED ORDER — BIOTENE DRY MOUTH MT LIQD
15.0000 mL | Freq: Two times a day (BID) | OROMUCOSAL | Status: DC
  Administered 2013-05-07 – 2013-05-09 (×4): 15 mL via OROMUCOSAL

## 2013-05-07 MED ORDER — DIVALPROEX SODIUM ER 250 MG PO TB24
250.0000 mg | ORAL_TABLET | Freq: Two times a day (BID) | ORAL | Status: DC
  Administered 2013-05-07 – 2013-05-09 (×5): 250 mg via ORAL
  Filled 2013-05-07 (×7): qty 1

## 2013-05-07 MED ORDER — PANTOPRAZOLE SODIUM 40 MG PO PACK
40.0000 mg | PACK | Freq: Every day | ORAL | Status: DC
  Filled 2013-05-07: qty 20

## 2013-05-07 MED ORDER — INSULIN ASPART 100 UNIT/ML ~~LOC~~ SOLN
0.0000 [IU] | Freq: Three times a day (TID) | SUBCUTANEOUS | Status: DC
  Administered 2013-05-08 – 2013-05-09 (×2): 2 [IU] via SUBCUTANEOUS

## 2013-05-07 NOTE — Progress Notes (Signed)
PULMONARY  / CRITICAL CARE MEDICINE  Name: [name] MRN: [id number] DOB: 1951/05/02    ADMISSION DATE:  04/30/2013 CONSULTATION DATE:  04/30/2013  REFERRING MD :  EDP PRIMARY SERVICE:  PCCM  CHIEF COMPLAINT:  Acute encephalopathy  BRIEF PATIENT DESCRIPTION: 62 yo NH resident with COPD, OSA, OHS (on BiPAP / oxygen) and on multiple psychoactive Rx sent to [facility] ED with acute encephalopathy.  Intubated for airway protection.  SIGNIFICANT EVENTS / STUDIES:  10/28  Intubated for airway protection (dificult intubation - large tongue, small oropharynx, anterior larynx, morbid obesity)  LINES / TUBES: OETT 10/28 >>>10/31 Trach [place]) 10/31>>> OGT 10/28 >>>10/31 NGT 10/31>>> Foley 10/28 >>> L [place] TLC 10/28 >>>  CULTURES: 10/28  Blood >>>NTD 10/28  Urine >>>NTD 10/28  Resp >>>Yeast  ANTIBIOTICS: Vancomycin 10/28 >>> 11/1 Aztreonam 10/28 >>>11/4  INTERVAL HISTORY: Tolerates ATC , PM valve No chest pain or dyspnea afebrile  VITAL SIGNS: Temp:  [97.5 F (36.4 C)-100 F (37.8 C)] 99.6 F (37.6 C) (11/04 0800) Pulse Rate:  [58-104] 88 (11/04 0900) Resp:  [22-31] 23 (11/04 0900) BP: (93-189)/(56-105) 93/62 mmHg (11/04 0800) SpO2:  [96 %-100 %] 99 % (11/04 0900) FiO2 (%):  [28 %] 28 % (11/04 0900) Weight:  [127.8 kg (281 lb 12 oz)] 127.8 kg (281 lb 12 oz) (11/04 0349)  HEMODYNAMICS:   VENTILATOR SETTINGS: Vent Mode:  [-]  FiO2 (%):  [28 %] 28 %  INTAKE / OUTPUT: Intake/Output     11/03 0701 - 11/04 0700 11/04 0701 - 11/05 0700   I.V. (mL/kg) 340 (2.7) 160 (1.3)   NG/GT 545    IV Piggyback 50    Total Intake(mL/kg) 935 (7.3) 160 (1.3)   Urine (mL/kg/hr) 100 (0)    Total Output 100     Net +835 +160        Urine Occurrence 3 x    Stool Occurrence 3 x     PHYSICAL EXAMINATION:  Gen : awake, alert, comfortable on ATC HEENT: NCAT, Trache site c/d/i PULM: CTA B CV: distant, normal S1/S2 AB: BS+, soft, nontender Ext: chronic edema Neuro: Awake and alert,  following commands, cooperative  LABS:  Recent Labs Lab 04/30/13 1615  04/30/13 1700  04/30/13 1827 04/30/13 2200 04/30/13 2244 05/01/13 0430  05/02/13 0430 05/02/13 0435 05/03/13 0439 05/03/13 0440 05/04/13 0615 05/05/13 0430 05/06/13 0500  HGB 9.6*  --   --   --   --   --   --  9.4*  < > 9.6*  --   --  9.5* 9.7*  --   --   WBC 11.5*  --   --   --   --   --   --  6.3  < > 8.4  --   --  7.4 9.2  --   --   PLT 245  --   --   --   --   --   --  188  < > 188  --   --  205 229  --   --   NA 137  --   --   --   --   --   --  139  < > 140  --   --  142 141 143 146*  K 3.1*  --   --   --   --   --   --  2.9*  < > 3.1*  --   --  3.1* 3.2* 3.1* 3.2*  CL  80*  --   --   --   --   --   --  88*  < > 89*  --   --  92* 92* 95* 99  CO2 >45*  --   --   --   --   --   --  >45*  < > 45*  --   --  44* 39* 39* 41*  GLUCOSE 145*  --   --   --   --   --   --  108*  < > 133*  --   --  112* 104* 116* 123*  BUN 28*  --   --   --   --   --   --  30*  < > 32*  --   --  27* 21 21 23   CREATININE 1.21*  --   --   --   --   --   --  1.25*  < > 1.12*  --   --  0.83 0.81 0.75 0.69  CALCIUM 10.4  --   --   --   --   --   --  9.1  < > 9.0  --   --  9.3 9.3 9.8 9.4  MG  --   --   --   --   --   --   --  1.6  < > 2.0  --   --  2.0 1.7  --   --   PHOS  --   --   --   --   --   --   --  3.4  < > 2.9  --   --  2.9 3.9  --   --   AST 31  --   --   --   --   --   --   --   --   --   --   --   --   --   --   --   ALT 14  --   --   --   --   --   --   --   --   --   --   --   --   --   --   --   ALKPHOS 54  --   --   --   --   --   --   --   --   --   --   --   --   --   --   --   BILITOT 0.3  --   --   --   --   --   --   --   --   --   --   --   --   --   --   --   PROT 7.5  --   --   --   --   --   --   --   --   --   --   --   --   --   --   --   ALBUMIN 3.7  --   --   --   --   --   --   --   --   --   --   --   --   --   --   --   APTT  --   --   --   --   --   --   --   --   --   --   --   --  31  --   --   --    INR  --   --   --   --   --   --   --   --   --   --   --   --  1.01  --   --   --   LATICACIDVEN  --   --  1.40  --   --   --   --   --   --   --   --   --   --   --   --   --   TROPONINI  --   --   --   --  <0.30  --   --   --   --   --   --   --   --   --   --   --   PROCALCITON  --   --   --   --   --  0.48  --  0.55  --  0.51  --   --   --   --   --   --   PHART  --   < >  --   < >  --   --  7.489*  --   --   --  7.426 7.431  --   --   --   --   PCO2ART  --   < >  --   < >  --   --  68.4*  --   --   --  74.1* 66.0*  --   --   --   --   PO2ART  --   < >  --   < >  --   --  117.0*  --   --   --  90.6 109.0*  --   --   --   --   < > = values in this interval not displayed.  Recent Labs Lab 05/06/13 1659 05/06/13 2010 05/06/13 2336 05/07/13 0401 05/07/13 0813  GLUCAP 111* 124* 110* 115* 115*   CXR: 11/2 low volumes  ASSESSMENT / PLAN:  PULMONARY A: Acute on chronic respiratory failure.   Possible pneumonia (HCAP).   Rx induced hypoventilation> resolved OSA/OHS.   COPD without exacerbation. P:   - ATC -advance PMV & wallow - Albuterol  - Flonase, Synbicort, Spiriva.  CARDIOVASCULAR A: CAD.  Hypertension.  P:  - resume Clonidine - continue norvasc, imdur   RENAL A:  AKI.  Hypovolemia. Hypokalemia. P:   - K replaced   GASTROINTESTINAL A:  Morbid obesity.  GERD. P:   - Protonix for GI Px. - advance per speech eval   HEMATOLOGIC A:  Mild anemia. P:  - Trend CBC. - Heparin for DVT Px.  INFECTIOUS A:  Suspected HCAP.  PCN allergy. P:   - d/c vanc - dc  Aztreonam x  7 days  ENDOCRINE  A:  Hyperglycemia. P:   - SSI. - CBGs.  NEUROLOGIC A:  Acute encephalopathy > agitation , pulling at tubes/lines P:   - restart Abilify, Buspar, duloxetine, depakote - continue to hold   Gabapentin, Oxycodone, (the need for this needs to be addressed prior to discharge!!!) - this is the third admission since May with similar presentation and she is continued on the  same outpatient regimen nevertheless).   Today's summary: ATC , advance PO if clears swallow, can return to [place] once NG out  Oretha Milch., MD Pulmonary and Critical Care Medicine [facility] Pager: [phone]   05/07/2013, 11:01 AM

## 2013-05-07 NOTE — Progress Notes (Signed)
Speech Language Pathology Treatment: [name] Speaking valve  Patient Details Name: [name] MRN: [id number] DOB: [birth year] Today's Date: 05/07/2013 Time: 1300-1400 SLP Time Calculation (min): 60 min  Assessment / Plan / Recommendation Clinical Impression  Pt demonstrates adequate tolerance of PMSV, wearing during all waking hours today with intermittent RN supervision. SLP observed pt for about an hour; pt communicating at conversation level. Instructed pt in precautions, pt verbalized x2, placed visual reminders. Also gave moderate verbal cues for pt to remove valve, return demonstrated x2. Needed min tactile cues to place valve again. Progressing very well. Ok to continue wearing valve with intermittent supervision during all waking hours.    HPI HPI:  62 year old female with history of COPD, obesity hypoventilation and multiple previous episodes of respiratory failure who was found unresponsive by SNF staff and EMS was called.  When EMS arrived the patient was altered but was able to protect her airway.  Patient was placed on 100% NRB and brought to the ER where she was placed on BiPAP and showed signs of improvement   Pertinent Vitals NA  SLP Plan  Continue with current plan of care    Recommendations        Patient may use Passy-Muir Speech Valve: During all waking hours (remove during sleep);During PO intake/meals;During all therapies with supervision PMSV Supervision: Intermittent       Oral Care Recommendations: Oral care BID Follow up Recommendations: Skilled Nursing facility;24 hour supervision/assistance Plan: Continue with current plan of care    GO    [name], MA CCC-SLP [phone]  [name] 05/07/2013, 2:28 PM

## 2013-05-07 NOTE — Progress Notes (Signed)
NUTRITION FOLLOW UP  Intervention:   1. General healthful diet; encourage intake as needed.  Consider supplements based on adequacy of intake.   NUTRITION DIAGNOSIS:  Inadequate oral intake related to inability to eat as evidenced by vent, NPO.   Monitor:  1. Enteral nutrition; continued tolerance with pt meeting>/=90% estimated needs.  No longer apprpriate, d/c. 2. Wt/wt change; monitor trends.  Ongoing.  3.  Food/Beverage; pt meeting >/=90% estimated needs with tolerance.   Assessment:   Pt admitted from NH with AMS. Pt with h/o COPD, OSA, OHS, and on multiple psychoactive agents.   Pt s/p trach placement, currently on trach collar.  Pt assessed by SLP who determined pt appropriate for Dysphagia 3 diet, thin liquids.   TFs discontinued.   Pt is pleasant and conversant during visit.  Happy about diet advancement.  RD notes pt may d/c to facility tomorrow.  Will continue to follow for PO adequacy.   Height: Ht Readings from Last 1 Encounters:  04/30/13 5' 4.17" (1.63 m)    Weight Status:   Wt Readings from Last 1 Encounters:  05/07/13 281 lb 12 oz (127.8 kg)    Re-estimated needs:  Kcal: 1380-1530 Protein: 65-76g Fluid: >2.0 L/day  Skin: non-pitting edema, intact  Diet Order:  Dysphagia 3, thin   Intake/Output Summary (Last 24 hours) at 05/07/13 1424 Last data filed at 05/07/13 1200  Gross per 24 hour  Intake    590 ml  Output      0 ml  Net    590 ml    Last BM: 11/3   Labs:   Recent Labs Lab 05/02/13 0430 05/03/13 0440 05/04/13 0615 05/05/13 0430 05/06/13 0500  NA 140 142 141 143 146*  K 3.1* 3.1* 3.2* 3.1* 3.2*  CL 89* 92* 92* 95* 99  CO2 45* 44* 39* 39* 41*  BUN 32* 27* 21 21 23   CREATININE 1.12* 0.83 0.81 0.75 0.69  CALCIUM 9.0 9.3 9.3 9.8 9.4  MG 2.0 2.0 1.7  --   --   PHOS 2.9 2.9 3.9  --   --   GLUCOSE 133* 112* 104* 116* 123*    CBG (last 3)   Recent Labs  05/07/13 0401 05/07/13 0813 05/07/13 1228  GLUCAP 115* 115* 122*     Scheduled Meds: . albuterol  2.5 mg Nebulization Q4H  . amLODipine  5 mg Oral Daily  . antiseptic oral rinse  1 application Mouth Rinse QID  . ARIPiprazole  10 mg Oral Daily  . budesonide-formoterol  2 puff Inhalation BID  . busPIRone  10 mg Oral BID  . chlorhexidine  15 mL Mouth/Throat BID  . cloNIDine  0.2 mg Oral BID  . divalproex  250 mg Oral BID  . DULoxetine  60 mg Oral BID  . feeding supplement (PRO-STAT SUGAR FREE 64)  60 mL Per Tube TID WC  . feeding supplement (VITAL HIGH PROTEIN)  1,000 mL Per Tube Q24H  . fluticasone  2 spray Each Nare Daily  . heparin subcutaneous  5,000 Units Subcutaneous Q8H  . insulin aspart  0-15 Units Subcutaneous Q4H  . isosorbide mononitrate  30 mg Oral Daily  . pantoprazole sodium  40 mg Per Tube Daily  . potassium chloride  40 mEq Per Tube Once  . tiotropium  18 mcg Inhalation Daily    Continuous Infusions: . sodium chloride 20 mL/hr at 05/07/13 1200    Loyce Dys, MS RD LDN Clinical Inpatient Dietitian Pager: [phone] Weekend/After hours pager: [phone]

## 2013-05-07 NOTE — Clinical Social Work Note (Signed)
CSW received call from [facility] that pt will likely be ready tomorrow or Thursday. CSW will facilitate discharge back to [facility] when medically appropriate.   Maryclare Labrador, MSW, [facility] Clinical Social Worker [phone]

## 2013-05-07 NOTE — Procedures (Signed)
Objective Swallowing Evaluation: Fiberoptic Endoscopic Evaluation of Swallowing  Patient Details  Name: [name] MRN: [id number] Date of Birth: [birth year]  Today's Date: 05/07/2013 Time: 1300-1400 SLP Time Calculation (min): 60 min  Past Medical History:  Past Medical History  Diagnosis Date  . COPD   . Hypertension   . Chronic respiratory failure     3 L oxygen  . GERD (gastroesophageal reflux disease)   . Diabetes mellitus   . Obesity, Class III, BMI 40-49.9 (morbid obesity)   . Obstructive sleep apnea on CPAP   . Peripheral vascular disease   . Bipolar disease, chronic   . Lymphedema   . COPD (chronic obstructive pulmonary disease)   . Cellulitis   . Coronary artery disease   . Asthma   . Myocardial infarction   . Vertigo    Past Surgical History:  Past Surgical History  Procedure Laterality Date  . Abdominal hysterectomy    . Cholecystectomy    . Appendectomy    . Tubal ligation     HPI:   62 year old female with history of COPD, obesity hypoventilation and multiple previous episodes of respiratory failure who was found unresponsive by SNF staff and EMS was called.  When EMS arrived the patient was altered but was able to protect her airway.  Patient was placed on 100% NRB and brought to the ER where she was placed on BiPAP and showed signs of improvement     Assessment / Plan / Recommendation Clinical Impression  Dysphagia Diagnosis: Mild oral phase dysphagia;Mild pharyngeal phase dysphagia Clinical impression: Pt presents with a mild oral dysphagia with piecemeal swallows of larger boluses and sluggish airway protection for second and third portions of bolus. Pharyngeal phase moderately impaired before NG tube removed. Adduction of arytenoids before the swallow protects airway but allows for penetration of vestibule due to incomplete epigottic deflection (NG impedes epiglottis). When NG removed, airway closure prior to the swallow is improved, consumption of  small sips of thin liquids functional with no significant penetration or aspiration. Recommend Dys 3 (mechanical soft diet) with thin liquids via cup. Pt does not have dentures and will need  softer diet. Pt also must wear PMSV during meals. Pt able to repeat and return demosntrate precautions. Will f/u for reinforcement.     Treatment Recommendation  Therapy as outlined in treatment plan below    Diet Recommendation Dysphagia 3 (Mechanical Soft);Thin liquid   Liquid Administration via: Cup;No straw Medication Administration: Whole meds with puree Supervision: Patient able to self feed;Intermittent supervision to cue for compensatory strategies Compensations: Slow rate;Small sips/bites Postural Changes and/or Swallow Maneuvers: Seated upright 90 degrees    Other  Recommendations Oral Care Recommendations: Oral care BID Other Recommendations: Place PMSV during PO intake   Follow Up Recommendations  Skilled Nursing facility;24 hour supervision/assistance    Frequency and Duration min 2x/week  1 week   Pertinent Vitals/Pain NA    SLP Swallow Goals     General HPI:  62 year old female with history of COPD, obesity hypoventilation and multiple previous episodes of respiratory failure who was found unresponsive by SNF staff and EMS was called.  When EMS arrived the patient was altered but was able to protect her airway.  Patient was placed on 100% NRB and brought to the ER where she was placed on BiPAP and showed signs of improvement Type of Study: Fiberoptic Endoscopic Evaluation of Swallowing Reason for Referral: Objectively evaluate swallowing function Previous Swallow Assessment: None found Diet  Prior to this Study: NPO Temperature Spikes Noted: No Respiratory Status: Trach collar Trach Size and Type: #6;Cuff;Deflated;With PMSV in place History of Recent Intubation: Yes Length of Intubations (days): 3 days Date extubated: 05/03/13 Behavior/Cognition: Alert;Cooperative Oral  Cavity - Dentition: Edentulous;Dentures, not available Oral Motor / Sensory Function: Within functional limits Self-Feeding Abilities: Able to feed self;Needs assist Patient Positioning: Upright in bed Baseline Vocal Quality: Clear;Low vocal intensity Volitional Cough: Strong Volitional Swallow: Able to elicit Anatomy: Within functional limits Pharyngeal Secretions: Normal    Reason for Referral Objectively evaluate swallowing function   Oral Phase Oral Preparation/Oral Phase Oral Phase: Impaired Oral - Thin Oral - Thin Straw: Piecemeal swallowing;Delayed oral transit (oral residuals with larger bolus, uncontolled spillage )   Pharyngeal Phase Pharyngeal Phase Pharyngeal Phase: Impaired Pharyngeal - Nectar Pharyngeal - Nectar Teaspoon: Reduced epiglottic inversion;Penetration/Aspiration during swallow Penetration/Aspiration details (nectar teaspoon): Material enters airway, remains ABOVE vocal cords then ejected out Pharyngeal - Nectar Cup: Reduced epiglottic inversion;Penetration/Aspiration during swallow Penetration/Aspiration details (nectar cup): Material enters airway, remains ABOVE vocal cords then ejected out Pharyngeal - Nectar Straw: Reduced epiglottic inversion;Penetration/Aspiration during swallow Penetration/Aspiration details (nectar straw): Material enters airway, remains ABOVE vocal cords then ejected out Pharyngeal - Thin Pharyngeal - Thin Teaspoon: Delayed swallow initiation;Penetration/Aspiration during swallow;Reduced epiglottic inversion Penetration/Aspiration details (thin teaspoon): Material enters airway, remains ABOVE vocal cords then ejected out Pharyngeal - Thin Cup: Reduced epiglottic inversion;Penetration/Aspiration during swallow;Pharyngeal residue - cp segment [facility] after NG out) Penetration/Aspiration details (thin cup): Material enters airway, remains ABOVE vocal cords then ejected out;Material enters airway, CONTACTS cords then ejected out;Material does  not enter airway Pharyngeal - Thin Straw: Reduced epiglottic inversion;Penetration/Aspiration during swallow;Pharyngeal residue - cp segment;Penetration/Aspiration after swallow Penetration/Aspiration details (thin straw): Material enters airway, remains ABOVE vocal cords then ejected out;Material enters airway, CONTACTS cords then ejected out Pharyngeal - Solids Pharyngeal - Puree: Within functional limits Pharyngeal - Regular: Within functional limits  Cervical Esophageal Phase    GO             Harlon Ditty, MA CCC-SLP [phone]  [name] 05/07/2013, 3:01 PM

## 2013-05-08 LAB — GLUCOSE, CAPILLARY
Glucose-Capillary: 125 mg/dL — ABNORMAL HIGH (ref 70–99)
Glucose-Capillary: 127 mg/dL — ABNORMAL HIGH (ref 70–99)
Glucose-Capillary: 189 mg/dL — ABNORMAL HIGH (ref 70–99)

## 2013-05-08 LAB — BASIC METABOLIC PANEL
BUN: 16 mg/dL (ref 6–23)
CO2: 37 mEq/L — ABNORMAL HIGH (ref 19–32)
GFR calc non Af Amer: 88 mL/min — ABNORMAL LOW (ref >90)
Glucose, Bld: 105 mg/dL — ABNORMAL HIGH (ref 70–99)
Potassium: 3.4 mEq/L — ABNORMAL LOW (ref 3.5–5.1)
Sodium: 144 mEq/L (ref 135–145)

## 2013-05-08 MED ORDER — POTASSIUM CHLORIDE CRYS ER 20 MEQ PO TBCR
40.0000 meq | EXTENDED_RELEASE_TABLET | Freq: Once | ORAL | Status: AC
  Administered 2013-05-08: 40 meq via ORAL
  Filled 2013-05-08: qty 2

## 2013-05-08 MED ORDER — ALBUTEROL SULFATE (5 MG/ML) 0.5% IN NEBU
2.5000 mg | INHALATION_SOLUTION | Freq: Four times a day (QID) | RESPIRATORY_TRACT | Status: DC
  Administered 2013-05-08 – 2013-05-09 (×5): 2.5 mg via RESPIRATORY_TRACT
  Filled 2013-05-08 (×5): qty 0.5

## 2013-05-08 MED ORDER — IPRATROPIUM BROMIDE 0.02 % IN SOLN
0.5000 mg | Freq: Four times a day (QID) | RESPIRATORY_TRACT | Status: DC
  Administered 2013-05-08 – 2013-05-09 (×5): 0.5 mg via RESPIRATORY_TRACT
  Filled 2013-05-08 (×5): qty 2.5

## 2013-05-08 MED ORDER — PANTOPRAZOLE SODIUM 40 MG PO TBEC
40.0000 mg | DELAYED_RELEASE_TABLET | Freq: Every day | ORAL | Status: DC
  Administered 2013-05-08 – 2013-05-09 (×2): 40 mg via ORAL
  Filled 2013-05-08 (×2): qty 1

## 2013-05-08 NOTE — Progress Notes (Signed)
Speech Language Pathology Treatment: Dysphagia  Patient Details Name: [name] MRN: [id number] DOB: Oct 04, 1950 Today's Date: 05/08/2013 Time: 6578-4696 SLP Time Calculation (min): 22 min  Assessment / Plan / Recommendation Clinical Impression  Skilled treatment session focused on addressing dysphagia goals.  SLP facilitated session with Dys.3 textures and thin liquids via cup; patient required subtle verbal cues to self-monitor and correct intermittent wet vocal quality during session.  Patient was Mod I for recall and use of small cup sips and no straws restriction.  SLP also verbally educated patient regarding need for 24/7 Supervision upon discharge due to intermittent assist needed with p.o. and PMSV donning/doffing.  SLP recommended that patient take home safe swallow signs to remind her after discharge. It is recommneded that this patient continue to receive skilled SLP services at the next level of care to address least restricitve p.o. Intake and maximize overall safety and independence.   HPI HPI:  62 year old female with history of COPD, obesity hypoventilation and multiple previous episodes of respiratory failure who was found unresponsive by SNF staff and EMS was called.  When EMS arrived the patient was altered but was able to protect her airway.  Patient was placed on 100% NRB and brought to the ER where she was placed on BiPAP and showed signs of improvement   Pertinent Vitals none  SLP Plan  Continue with current plan of care    Recommendations Diet recommendations: Dysphagia 3 (mechanical soft);Thin liquid Liquids provided via: Cup;No straw Medication Administration: Whole meds with puree Supervision: Patient able to self feed;Intermittent supervision to cue for compensatory strategies Compensations: Slow rate;Small sips/bites Postural Changes and/or Swallow Maneuvers: Seated upright 90 degrees      Patient may use Passy-Muir Speech Valve: During all waking hours  (remove during sleep);During PO intake/meals;During all therapies with supervision PMSV Supervision: Intermittent       Oral Care Recommendations: Oral care BID Follow up Recommendations: 24 hour supervision/assistance;Home health SLP;Skilled Nursing facility Plan: Continue with current plan of care    GO    Charlane Ferretti., CCC-SLP [phone]  Marquett Bertoli 05/08/2013, 3:23 PM

## 2013-05-08 NOTE — Progress Notes (Addendum)
PULMONARY  / CRITICAL CARE MEDICINE  Name: [name] MRN: [id number] DOB: February 19, 1951    ADMISSION DATE:  04/30/2013 CONSULTATION DATE:  04/30/2013  REFERRING MD :  EDP PRIMARY SERVICE:  PCCM  CHIEF COMPLAINT:  Acute encephalopathy  BRIEF PATIENT DESCRIPTION: 62 yo NH resident with COPD, OSA, OHS (on BiPAP / oxygen) and on multiple psychoactive Rx sent to [facility] ED with acute encephalopathy.  Intubated for airway protection.  SIGNIFICANT EVENTS / STUDIES:  10/28  Intubated for airway protection (dificult intubation - large tongue, small oropharynx, anterior larynx, morbid obesity)  LINES / TUBES: OETT 10/28 >>>10/31 Trach Ninetta Lights) 10/31>>> OGT 10/28 >>>10/31 NGT 10/31>>> Foley 10/28 >>> L Kimball TLC 10/28 >>>  CULTURES: 10/28  Blood >>>NTD 10/28  Urine >>>NTD 10/28  Resp >>>Yeast  ANTIBIOTICS: Vancomycin 10/28 >>> 11/1 Aztreonam 10/28 >>>11/4  INTERVAL HISTORY: Tolerates PM valve, PO No chest pain or dyspnea afebrile  VITAL SIGNS: Temp:  [97.7 F (36.5 C)-99.4 F (37.4 C)] 98.8 F (37.1 C) (11/05 0818) Pulse Rate:  [29-100] 29 (11/05 0850) Resp:  [0-31] 21 (11/05 0850) BP: (120-178)/(56-90) 129/74 mmHg (11/05 0818) SpO2:  [82 %-100 %] 82 % (11/05 0850) FiO2 (%):  [28 %] 28 % (11/05 0850) Weight:  [122.6 kg (270 lb 4.5 oz)] 122.6 kg (270 lb 4.5 oz) (11/05 0500)  HEMODYNAMICS:   VENTILATOR SETTINGS: Vent Mode:  [-]  FiO2 (%):  [28 %] 28 %  INTAKE / OUTPUT: Intake/Output     11/04 0701 - 11/05 0700 11/05 0701 - 11/06 0700   P.O. 180    I.V. (mL/kg) 440 (3.6)    NG/GT     IV Piggyback     Total Intake(mL/kg) 620 (5.1)    Urine (mL/kg/hr) 300 (0.1) 400 (0.8)   Stool  1 (0)   Total Output 300 401   Net +320 -401        Urine Occurrence 1 x     PHYSICAL EXAMINATION:  Gen : awake, alert, comfortable on ATC HEENT: NCAT, Trache site c/d/i PULM: CTA B CV: distant, normal S1/S2 AB: BS+, soft, nontender Ext: chronic edema Neuro: Awake and alert, following  commands, cooperative  LABS:  Recent Labs Lab 05/02/13 0430 05/02/13 0435 05/03/13 0439 05/03/13 0440 05/04/13 0615 05/05/13 0430 05/06/13 0500 05/08/13 0527  HGB 9.6*  --   --  9.5* 9.7*  --   --   --   WBC 8.4  --   --  7.4 9.2  --   --   --   PLT 188  --   --  205 229  --   --   --   NA 140  --   --  142 141 143 146* 144  K 3.1*  --   --  3.1* 3.2* 3.1* 3.2* 3.4*  CL 89*  --   --  92* 92* 95* 99 100  CO2 45*  --   --  44* 39* 39* 41* 37*  GLUCOSE 133*  --   --  112* 104* 116* 123* 105*  BUN 32*  --   --  27* 21 21 23 16   CREATININE 1.12*  --   --  0.83 0.81 0.75 0.69 0.75  CALCIUM 9.0  --   --  9.3 9.3 9.8 9.4 9.2  MG 2.0  --   --  2.0 1.7  --   --   --   PHOS 2.9  --   --  2.9 3.9  --   --   --  APTT  --   --   --  31  --   --   --   --   INR  --   --   --  1.01  --   --   --   --   PROCALCITON 0.51  --   --   --   --   --   --   --   PHART  --  7.426 7.431  --   --   --   --   --   PCO2ART  --  74.1* 66.0*  --   --   --   --   --   PO2ART  --  90.6 109.0*  --   --   --   --   --     Recent Labs Lab 05/07/13 0813 05/07/13 1228 05/07/13 1708 05/07/13 2142 05/08/13 0818  GLUCAP 115* 122* 85 109* 125*   CXR: 11/2 low volumes  ASSESSMENT / PLAN:  PULMONARY A: Acute on chronic respiratory failure.   Possible pneumonia (HCAP).   Rx induced hypoventilation> resolved OSA/OHS.   COPD without exacerbation. P:   -advance PMV & PO - Albuterol  - Flonase, Synbicort, Spiriva. -Tstomy curative for OSA, may have to stay  CARDIOVASCULAR A: CAD.  Hypertension.  P:  - resume Clonidine - continue norvasc, imdur  RENAL A:  AKI.  Hypovolemia. Hypokalemia. P:   - K replaced   GASTROINTESTINAL A:  Morbid obesity.  GERD. P:   - Protonix for GI Px. - advance per speech eval   HEMATOLOGIC A:  Mild anemia. P:  - Trend CBC. - Heparin for DVT Px.  INFECTIOUS A:  Suspected HCAP.  PCN allergy. P:   - d/c vanc - dc  Aztreonam x  7 days  ENDOCRINE  A:   Hyperglycemia. P:   - SSI. - CBGs.  NEUROLOGIC A:  Acute encephalopathy > agitation , pulling at tubes/lines P:   - restart Abilify, Buspar, duloxetine, depakote - continue to hold   Gabapentin, Oxycodone, (the need for this needs to be addressed prior to discharge!!!) - this is the third admission since May with similar presentation and she is continued on the same outpatient regimen nevertheless).   Today's summary: can return to [facility] tomorrow   Oretha Milch., MD Pulmonary and Critical Care Medicine [facility] Pager: [phone]   05/08/2013, 10:51 AM

## 2013-05-08 NOTE — Clinical Social Work Note (Signed)
CSW received call from [facility] that physician will discharge pt tomorrow. CSW will provide covering CSW with a handoff, as this social worker will not be here tomorrow. CSW has called [facility] and made them aware.   Maryclare Labrador, MSW, [facility] Clinical Social Worker [phone]

## 2013-05-09 LAB — GLUCOSE, CAPILLARY: Glucose-Capillary: 113 mg/dL — ABNORMAL HIGH (ref 70–99)

## 2013-05-09 MED ORDER — ALBUTEROL SULFATE (2.5 MG/3ML) 0.083% IN NEBU: 2.5000 mg | INHALATION_SOLUTION | RESPIRATORY_TRACT | Status: DC | PRN

## 2013-05-09 NOTE — Progress Notes (Addendum)
Covering Clinical Child psychotherapist (CSW) informed that pt is medically ready for discharge today. CSW confirmed with [facility] that pt is okay to return. CSW attempted to reach pt daughter however CSW was unable to leave a message. Dc packet has been prepared and placed in pt shadow chart. PTAR contacted for transport.  No additional needs, CSW signing off.  Theresia Bough, MSW, LCSW [phone]

## 2013-05-09 NOTE — Progress Notes (Signed)
Discharged to SNF via ambulance trache collar in used., stable. Belongings and discharge instructions with ambulance staff.

## 2013-05-09 NOTE — Discharge Summary (Addendum)
Physician Discharge Summary  Patient ID: [name] MRN: [id number] DOB/AGE: 09/18/1950 62 y.o.  Admit date: 04/30/2013 Discharge date: 05/09/2013    Discharge Diagnoses:  Acute on Chronic Respiratory Failure HCAP Tracheostomy Status Medication Induced Hypoventilation  OSA / OHS COPD without acute exacerbation  CAD HTN AKI Hypovolemia Hypokalemia GERD Morbid Obesity Dysphagia Anemia Hyperglycemia Acute Encephalopathy                                                                      DISCHARGE PLAN BY DIAGNOSIS    Acute on Chronic Respiratory Failure Tracheostomy Status HCAP Medication Induced Hypoventilation  OSA / OHS COPD without acute exacerbation   Discharge Plan: -would not de-cannulate given morbid obesity, OSA / OHS and narcotic use -trach care QD & PRN with inner cannula change -Continue flonase, symbicort, spiriva -would not restart percocet & neurontin.  LIMIT narcotics / sedating medications given pulmonary status.   CAD HTN  Discharge Plan: -continue norvasc, imdur, clonidine -hold lasix at discharge.  Consider restart after 11/10 BMP results reviewed.   AKI Hypovolemia Hypokalemia  Discharge Plan: -follow up BMP on 11/10 to review potassium  -PRN monitoring of sr cr  GERD Morbid Obesity Dysphagia  Discharge Plan: -dysphagia 3, thin liquid diet.  Cups, no straws. Meds whole with puree.  Intermittent supervision recommended.  -continue pepcid  Anemia  Discharge Plan: -monitor H/H -consider further anemia work up as outpatient  Hyperglycemia - Resolved.    Acute Encephalopathy - Resolved.   Discharge Plan: -Limit sedating medications -mobilize, continue ongoing PT efforts                  DISCHARGE SUMMARY   [name] is a 62 y.o. y/o female, SNF resident, with a PMH of HTN, GERD, DM, Morbid Obesity, PVD, Bipolar Disorder, Lymphedema, CAD, and COPD who presented to [facility] ER on 10/28 with acute hypercarbic  respiratory failure.  ER evaluation demonstrated PCO2 > 90, altered mental status and respiratory insufficiency.  She was intubated in the ER for airway protection and was a difficult airway (dificult intubation - large tongue, small oropharynx, anterior larynx, morbid obesity).  She was pan cultured which are essentially negative to date, empirically treated with IV antibiotics for PNA (see below for details). Patient ultimately required tracheostomy placement in the setting of known difficult airway, morbid obesity, & OSA/OHS.   It is recommended that she not be decannulated due to above mentioned pulmonary issues.  Also, limiting sedating medications as able which would contribute to hypercarbic respiratory failure.  She was evaluated by SLP for swallowing needs and was recommended for dysphagia 3, thin liquid diet.  Home blood pressure medications resumed prior to discharge and tolerated well.              LINES / TUBES:  OETT 10/28 >>>10/31  Trach Ninetta Lights) 10/31>>>  OGT 10/28 >>>10/31  NGT 10/31>>>  Foley 10/28 >>>  L [place] TLC 10/28 >>>   CULTURES:  10/28 Blood >>>NTD  10/28 Urine >>>NTD  10/28 Resp >>>Yeast   ANTIBIOTICS:  Vancomycin 10/28 >>> 11/1  Aztreonam 10/28 >>>11/4  Discharge Exam: Gen : awake, alert, comfortable on ATC  HEENT: NCAT, Trach site c/d/i  PULM: CTA B  CV: distant, normal  S1/S2  AB: BS+, soft, nontender  Ext: chronic edema  Neuro: Awake and alert, following commands, cooperative   Filed Vitals:   05/09/13 0500 05/09/13 0716 05/09/13 0818 05/09/13 1115  BP:  140/99  141/69  Pulse:  67 75 73  Temp:  99 F (37.2 C)  98.5 F (36.9 C)  TempSrc:  Oral  Oral  Resp:  28 23 18   Height:      Weight: 279 lb 15.8 oz (127 kg)     SpO2:  98% 100% 99%     Discharge Labs  BMET  Recent Labs Lab 05/03/13 0440 05/04/13 0615 05/05/13 0430 05/06/13 0500 05/08/13 0527  NA 142 141 143 146* 144  K 3.1* 3.2* 3.1* 3.2* 3.4*  CL 92* 92* 95* 99 100  CO2 44* 39* 39*  41* 37*  GLUCOSE 112* 104* 116* 123* 105*  BUN 27* 21 21 23 16   CREATININE 0.83 0.81 0.75 0.69 0.75  CALCIUM 9.3 9.3 9.8 9.4 9.2  MG 2.0 1.7  --   --   --   PHOS 2.9 3.9  --   --   --    CBC  Recent Labs Lab 05/03/13 0440 05/04/13 0615  HGB 9.5* 9.7*  HCT 29.4* 30.4*  WBC 7.4 9.2  PLT 205 229   Anti-Coagulation  Recent Labs Lab 05/03/13 0440  INR 1.01    Discharge Orders   Future Orders Complete By Expires   Call MD for:  difficulty breathing, headache or visual disturbances  As directed    Call MD for:  persistant dizziness or light-headedness  As directed    Call MD for:  severe uncontrolled pain  As directed    Call MD for:  temperature >100.4  As directed    Diet - low sodium heart healthy  As directed    Discharge instructions  As directed    Comments:     1. Do not resume gabapentin & percocet  2. Follow up with MD at SNF 3. Dysphagia 3, thin liquid diet 4. Follow up BMP on 11/10 to review potassium   Increase activity slowly  As directed          Follow-up Information   Follow up with [facility], MD. (Post discharge from Skilled Nursing Facility)    Specialty:  Internal Medicine   Contact information:   [address] [place] [address] [phone]          Medication List    STOP taking these medications       docusate sodium 100 MG capsule  Commonly known as:  COLACE     feeding supplement (PRO-STAT SUGAR FREE 64) Liqd     furosemide 40 MG tablet  Commonly known as:  LASIX     gabapentin 600 MG tablet  Commonly known as:  NEURONTIN     metoCLOPramide 5 MG tablet  Commonly known as:  REGLAN     metolazone 5 MG tablet  Commonly known as:  ZAROXOLYN     oxyCODONE-acetaminophen 5-325 MG per tablet  Commonly known as:  PERCOCET/ROXICET      TAKE these medications       albuterol (2.5 MG/3ML) 0.083% nebulizer solution  Commonly known as:  PROVENTIL  Take 3 mLs (2.5 mg total) by nebulization every 4 (four) hours as  needed for wheezing.     amLODipine 5 MG tablet  Commonly known as:  NORVASC  Take 5 mg by mouth daily.     ARIPiprazole 10  MG tablet  Commonly known as:  ABILIFY  Take 10 mg by mouth daily. For depressive psychosis     ascorbic acid 500 MG tablet  Commonly known as:  VITAMIN C  Take 500 mg by mouth 2 (two) times daily.     budesonide-formoterol 160-4.5 MCG/ACT inhaler  Commonly known as:  SYMBICORT  Inhale 2 puffs into the lungs 2 (two) times daily.     busPIRone 10 MG tablet  Commonly known as:  BUSPAR  Take 10 mg by mouth 2 (two) times daily.     CERTAGEN PO  Take 1 tablet by mouth daily.     cloNIDine 0.2 MG tablet  Commonly known as:  CATAPRES  Take 0.2 mg by mouth 2 (two) times daily.     divalproex 250 MG 24 hr tablet  Commonly known as:  DEPAKOTE ER  Take 250 mg by mouth 2 (two) times daily.     DULoxetine 60 MG capsule  Commonly known as:  CYMBALTA  Take 60 mg by mouth 2 (two) times daily.     famotidine 20 MG tablet  Commonly known as:  PEPCID  Take 20 mg by mouth 2 (two) times daily.     fluticasone 50 MCG/ACT nasal spray  Commonly known as:  FLONASE  Place 2 sprays into the nose daily.     isosorbide mononitrate 30 MG 24 hr tablet  Commonly known as:  IMDUR  Take 30 mg by mouth daily.     nitroGLYCERIN 0.4 MG SL tablet  Commonly known as:  NITROSTAT  Place 0.4 mg under the tongue every 5 (five) minutes as needed for chest pain.     senna 8.6 MG Tabs tablet  Commonly known as:  SENOKOT  Take 2 tablets by mouth daily.     simethicone 80 MG chewable tablet  Commonly known as:  MYLICON  Chew 80 mg by mouth every 8 (eight) hours.     tiotropium 18 MCG inhalation capsule  Commonly known as:  SPIRIVA  Place 18 mcg into inhaler and inhale daily.     vitamin D (CHOLECALCIFEROL) 400 UNITS tablet  Take 400 Units by mouth daily.          Disposition:  [facility].  Discharged Condition: [name] has met  maximum benefit of inpatient care and is medically stable and cleared for discharge.  Patient is pending follow up as above.      Time spent on disposition:  Greater than 35 minutes.   Signed: Canary Brim, NP-C [facility] Pgr: [phone] Office: [phone]  Tracheostomy will likely stay lifelong , curative for OSA, she can FU with trach clinic for changes -her rehab potential appears poor since she has been bedbound for > 1 year but can be readdressed at SNF Independently examined pt, evaluated data & formulated above care plan with NP who scribed this note & edited by me.  Lee Kalt V.  [phone]

## 2013-05-09 NOTE — Progress Notes (Signed)
Trach changed from a 6 shiley cuffed to 6 shiley uncuffed. Placement check with co2 detector. New ties and new inner cannula were placed

## 2013-05-10 NOTE — Progress Notes (Signed)
Patient ID: [name], female   DOB: 12/26/1950, 62 y.o.   MRN: [id number]        HISTORY & PHYSICAL  DATE: 04/15/2013   FACILITY: [facility]  LEVEL OF CARE: SNF (31)  ALLERGIES:  Allergies  Allergen Reactions  . Penicillins Anaphylaxis    Tongue swelling  . Aspirin Other (See Comments)    Per MAR  . Bee Venom Other (See Comments)    Unknown  . Lisinopril     Per MAR  . Nyquil Cold & [Dm-Doxylamine-Acetaminophen] Other (See Comments)    Unknown   . Ppd [Tuberculin Purified Protein Derivative] Other (See Comments)    unknown    CHIEF COMPLAINT:  Manage COPD, diabetes mellitus, and hypertension.    HISTORY OF PRESENT ILLNESS:  The patient is a 62 year-old, African-American female who was hospitalized secondary to acute  respiratory failure.  After hospitalization, patient is readmitted back to this facility for long-term care management.     COPD: the COPD remains stable.  Pt denies sob, cough, wheezing or declining exercise tolerance.  No complications from the medications presently being used.    DM:pt's DM remains stable.  Pt denies polyuria, polydipsia, polyphagia, changes in vision or hypoglycemic episodes.  No complications noted from the medication presently being used.  Last hemoglobin A1c is:  6.1 on 04/03/2013.    HTN: Pt 's HTN remains stable.  Denies CP, sob, DOE, pedal edema, headaches, dizziness or visual disturbances.  No complications from the medications currently being used.  Last BP :  122/84.    PAST MEDICAL HISTORY :  Past Medical History  Diagnosis Date  . COPD   . Hypertension   . Chronic respiratory failure     3 L oxygen  . GERD (gastroesophageal reflux disease)   . Diabetes mellitus   . Obesity, Class III, BMI 40-49.9 (morbid obesity)   . Obstructive sleep apnea on CPAP   . Peripheral vascular disease   . Bipolar disease, chronic   . Lymphedema   . COPD (chronic obstructive pulmonary disease)   . Cellulitis   .  Coronary artery disease   . Asthma   . Myocardial infarction   . Vertigo     PAST SURGICAL HISTORY: Past Surgical History  Procedure Laterality Date  . Abdominal hysterectomy    . Cholecystectomy    . Appendectomy    . Tubal ligation      SOCIAL HISTORY:  reports that she has never smoked. She has never used smokeless tobacco. She reports that she does not drink alcohol or use illicit drugs.  FAMILY HISTORY:  Family History  Problem Relation Age of Onset  . Prostate cancer      CURRENT MEDICATIONS: Reviewed per [facility]  REVIEW OF SYSTEMS:  See HPI otherwise 14 point ROS is negative.  PHYSICAL EXAMINATION  VS:  T 97.8       P 88      RR 18      BP 122/84      POX 97% on oxygen        WT (Lb)  GENERAL: no acute distress, morbidly obese body habitus EYES: conjunctivae normal, sclerae normal, normal eye lids MOUTH/THROAT: lips without lesions,no lesions in the mouth,tongue is without lesions,uvula elevates in midline NECK: supple, trachea midline, no neck masses, no thyroid tenderness, no thyromegaly LYMPHATICS: no LAN in the neck, no supraclavicular LAN RESPIRATORY: breathing is even & unlabored, BS CTAB CARDIAC: RRR, no murmur,no extra  heart sounds   EDEMA/VARICOSITIES: left lower extremity has +1 edema, right lower extremity has no edema   GI:  ABDOMEN: abdomen soft, normal BS, no masses, no tenderness  LIVER/SPLEEN: no hepatomegaly, no splenomegaly MUSCULOSKELETAL: HEAD: normal to inspection & palpation BACK: no kyphosis, scoliosis or spinal processes tenderness EXTREMITIES: LEFT UPPER EXTREMITY: full range of motion, normal strength & tone RIGHT UPPER EXTREMITY:  full range of motion, normal strength & tone LEFT LOWER EXTREMITY: strength decreased, range of motion unable to assess   RIGHT LOWER EXTREMITY: strength decreased, range of motion unable to assess   PSYCHIATRIC: the patient is alert & oriented to person, affect & behavior appropriate  LABS/RADIOLOGY: CT of  the head:  Did not show any acute findings.  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    Chest x-ray:  No acute findings.      Blood culture x3 negative.  One blood culture grew strep.    MRSA by PCR negative.     Urine culture showed no growth.    Chloride 79, glucose 114, CO2 greater than 45, BUN 49, creatinine 1.12, otherwise BMP normal.    Magnesium 1.9, phosphorus 3.2, albumin 3.1, otherwise liver profile normal.    Hemoglobin 10.8, MCV 88.2, otherwise CBC normal.    Troponin-I less than 0.03 x3.     ASSESSMENT/PLAN:  COPD.  Well compensated.    Diabetes mellitus with neurological manifestations.  Well controlled.    Hypertension.  Well controlled.    Bipolar disorder.   Stable.    Obstructive sleep apnea.  Continue CPAP.    Anemia of chronic disease.  Reassess hemoglobin level.    Peripheral neuropathy.  Continue gabapentin.     Bacteremia.  Continue Levaquin as prescribed.    Check CBC, CMP, and Depakote level.    I have reviewed patient's medical records received at admission/from hospitalization.  CPT CODE: 82956

## 2013-05-12 ENCOUNTER — Emergency Department (HOSPITAL_COMMUNITY)
Admission: EM | Admit: 2013-05-12 | Discharge: 2013-05-12 | Disposition: A | Discharge status: Skilled Nursing Facility | Payer: Medicaid Other | Attending: Emergency Medicine | Admitting: Emergency Medicine

## 2013-05-12 ENCOUNTER — Encounter (HOSPITAL_COMMUNITY): Payer: Self-pay | Admitting: Emergency Medicine

## 2013-05-12 DIAGNOSIS — I252 Old myocardial infarction: Secondary | ICD-10-CM | POA: Insufficient documentation

## 2013-05-12 DIAGNOSIS — I2510 Atherosclerotic heart disease of native coronary artery without angina pectoris: Secondary | ICD-10-CM | POA: Insufficient documentation

## 2013-05-12 DIAGNOSIS — J449 Chronic obstructive pulmonary disease, unspecified: Secondary | ICD-10-CM | POA: Insufficient documentation

## 2013-05-12 DIAGNOSIS — K219 Gastro-esophageal reflux disease without esophagitis: Secondary | ICD-10-CM | POA: Insufficient documentation

## 2013-05-12 DIAGNOSIS — E119 Type 2 diabetes mellitus without complications: Secondary | ICD-10-CM | POA: Insufficient documentation

## 2013-05-12 DIAGNOSIS — G4733 Obstructive sleep apnea (adult) (pediatric): Secondary | ICD-10-CM | POA: Insufficient documentation

## 2013-05-12 DIAGNOSIS — J9503 Malfunction of tracheostomy stoma: Secondary | ICD-10-CM

## 2013-05-12 DIAGNOSIS — J4489 Other specified chronic obstructive pulmonary disease: Secondary | ICD-10-CM | POA: Insufficient documentation

## 2013-05-12 DIAGNOSIS — IMO0002 Reserved for concepts with insufficient information to code with codable children: Secondary | ICD-10-CM | POA: Insufficient documentation

## 2013-05-12 DIAGNOSIS — I10 Essential (primary) hypertension: Secondary | ICD-10-CM | POA: Insufficient documentation

## 2013-05-12 DIAGNOSIS — J9610 Chronic respiratory failure, unspecified whether with hypoxia or hypercapnia: Secondary | ICD-10-CM | POA: Insufficient documentation

## 2013-05-12 DIAGNOSIS — F319 Bipolar disorder, unspecified: Secondary | ICD-10-CM | POA: Insufficient documentation

## 2013-05-12 DIAGNOSIS — Z872 Personal history of diseases of the skin and subcutaneous tissue: Secondary | ICD-10-CM | POA: Insufficient documentation

## 2013-05-12 DIAGNOSIS — Z79899 Other long term (current) drug therapy: Secondary | ICD-10-CM | POA: Insufficient documentation

## 2013-05-12 DIAGNOSIS — Z880 Allergy status to penicillin: Secondary | ICD-10-CM | POA: Insufficient documentation

## 2013-05-12 DIAGNOSIS — Y833 Surgical operation with formation of external stoma as the cause of abnormal reaction of the patient, or of later complication, without mention of misadventure at the time of the procedure: Secondary | ICD-10-CM | POA: Insufficient documentation

## 2013-05-12 DIAGNOSIS — Z9981 Dependence on supplemental oxygen: Secondary | ICD-10-CM | POA: Insufficient documentation

## 2013-05-12 NOTE — ED Notes (Signed)
Telephone report given to [facility] home staff. Awaiting [facility] for transportation

## 2013-05-12 NOTE — ED Notes (Signed)
From [facility] - staff reports trach tube was displaced approx at 1315 & unable to reinsert it back. Upon arrival resp e/u, no distress. Pt denies SOB. POX > 97% on oxygen

## 2013-05-12 NOTE — ED Provider Notes (Signed)
CSN: [id number]     Arrival date & time 05/12/13  1350 History   First MD Initiated Contact with Patient 05/12/13 1434     Chief Complaint  Patient presents with  . Tracheostomy Tube Change   (Consider location/radiation/quality/duration/timing/severity/associated sxs/prior Treatment) HPI Comments: Pt with hx of COPD, OSA, chronic resp failure with tracheostomy comes in with tube displacement. Pt from [facility] - staff reports trach tube was displaced approx at 1315 & unable to reinsert it back. Pt denies any dib.  The history is provided by the patient.    Past Medical History  Diagnosis Date  . COPD   . Hypertension   . Chronic respiratory failure     3 L oxygen  . GERD (gastroesophageal reflux disease)   . Diabetes mellitus   . Obesity, Class III, BMI 40-49.9 (morbid obesity)   . Obstructive sleep apnea on CPAP   . Peripheral vascular disease   . Bipolar disease, chronic   . Lymphedema   . COPD (chronic obstructive pulmonary disease)   . Cellulitis   . Coronary artery disease   . Asthma   . Myocardial infarction   . Vertigo    Past Surgical History  Procedure Laterality Date  . Abdominal hysterectomy    . Cholecystectomy    . Appendectomy    . Tubal ligation    . Tracheostomy     Family History  Problem Relation Age of Onset  . Prostate cancer     History  Substance Use Topics  . Smoking status: Never Smoker   . Smokeless tobacco: Never Used  . Alcohol Use: No   OB History   Grav Para Term Preterm Abortions TAB SAB Ect Mult Living                 Review of Systems  Constitutional: Negative for fever.  Respiratory: Positive for cough. Negative for shortness of breath.   Cardiovascular: Negative for chest pain.  Gastrointestinal: Negative for nausea and vomiting.    Allergies  Penicillins; Aspirin; Bee venom; Lisinopril; Nyquil cold &; and Ppd  Home Medications   Current Outpatient Rx  Name  Route  Sig  Dispense  Refill  .  albuterol (PROVENTIL) (2.5 MG/3ML) 0.083% nebulizer solution   Nebulization   Take 3 mLs (2.5 mg total) by nebulization every 4 (four) hours as needed for wheezing.   75 mL   12   . amLODipine (NORVASC) 5 MG tablet   Oral   Take 5 mg by mouth daily.         . ARIPiprazole (ABILIFY) 10 MG tablet   Oral   Take 10 mg by mouth daily. For depressive psychosis         . ascorbic acid (VITAMIN C) 500 MG tablet   Oral   Take 500 mg by mouth 2 (two) times daily.         . budesonide-formoterol (SYMBICORT) 160-4.5 MCG/ACT inhaler   Inhalation   Inhale 2 puffs into the lungs 2 (two) times daily.         . busPIRone (BUSPAR) 10 MG tablet   Oral   Take 10 mg by mouth 2 (two) times daily.         . cloNIDine (CATAPRES) 0.2 MG tablet   Oral   Take 0.2 mg by mouth 2 (two) times daily.         . divalproex (DEPAKOTE ER) 250 MG 24 hr tablet   Oral  Take 250 mg by mouth 2 (two) times daily.         . DULoxetine (CYMBALTA) 60 MG capsule   Oral   Take 60 mg by mouth 2 (two) times daily.         . famotidine (PEPCID) 20 MG tablet   Oral   Take 20 mg by mouth 2 (two) times daily.         . fluticasone (FLONASE) 50 MCG/ACT nasal spray   Nasal   Place 2 sprays into the nose daily.         . isosorbide mononitrate (IMDUR) 30 MG 24 hr tablet   Oral   Take 30 mg by mouth daily.         . Multiple Vitamins-Minerals (CERTAGEN PO)   Oral   Take 1 tablet by mouth daily.         . nitroGLYCERIN (NITROSTAT) 0.4 MG SL tablet   Sublingual   Place 0.4 mg under the tongue every 5 (five) minutes as needed for chest pain.          Marland Kitchen senna (SENOKOT) 8.6 MG TABS   Oral   Take 2 tablets by mouth daily.         . simethicone (MYLICON) 80 MG chewable tablet   Oral   Chew 80 mg by mouth every 8 (eight) hours.         Marland Kitchen tiotropium (SPIRIVA) 18 MCG inhalation capsule   Inhalation   Place 18 mcg into inhaler and inhale daily.         . vitamin D, CHOLECALCIFEROL,  400 UNITS tablet   Oral   Take 400 Units by mouth daily.          BP 138/62  Pulse 84  Resp 18  SpO2 97% Physical Exam  Nursing note and vitals reviewed. Constitutional: She appears well-developed and well-nourished.  HENT:  Head: Normocephalic.  Eyes: Conjunctivae are normal.  Neck: Normal range of motion. Neck supple.  Pulmonary/Chest: Effort normal and breath sounds normal. No respiratory distress. She has no wheezes.  Abdominal: Soft.    ED Course  Procedures (including critical care time) Labs Review Labs Reviewed - No data to display Imaging Review No results found.  EKG Interpretation   None       MDM   1. Tracheostomy mechanical complication    Pt came in with tracheostomy tube displacement. We consulted RT, who was able to place a 4-0 shiley. PT had a 6-0 before. She is saturating well. Exam is benign. Will d.c   Derwood Kaplan, MD 05/12/13 1621

## 2013-05-12 NOTE — ED Notes (Signed)
RT at bedside upon pt arrival. Attempted to replace same size 6 shiley but unsuccessful. Placed size 4 shiley uncuffed. Pt tolerated well.

## 2013-05-12 NOTE — Progress Notes (Signed)
05/12/13 patient came in the ED from nursing home with trach sticking out. Patient stated she was in pain in her neck. Donah Driver RRT RCP and [facility] RRT RCP changed the trach out. We tried to place a 6 shiley CFS back in but the we could not get it in. Then we tried a 4 Shiley CFS it went in with no problems. The patient Sats are 99% on 28% 5L trach collar. Patient is stable. Rt will continue to monitor.

## 2013-05-14 ENCOUNTER — Non-Acute Institutional Stay (SKILLED_NURSING_FACILITY): Payer: Medicaid Other | Admitting: Internal Medicine

## 2013-05-14 DIAGNOSIS — J449 Chronic obstructive pulmonary disease, unspecified: Secondary | ICD-10-CM

## 2013-05-14 DIAGNOSIS — I2510 Atherosclerotic heart disease of native coronary artery without angina pectoris: Secondary | ICD-10-CM

## 2013-05-14 DIAGNOSIS — J4489 Other specified chronic obstructive pulmonary disease: Secondary | ICD-10-CM

## 2013-05-14 DIAGNOSIS — I10 Essential (primary) hypertension: Secondary | ICD-10-CM

## 2013-05-14 DIAGNOSIS — J9610 Chronic respiratory failure, unspecified whether with hypoxia or hypercapnia: Secondary | ICD-10-CM

## 2013-05-15 ENCOUNTER — Inpatient Hospital Stay (HOSPITAL_COMMUNITY)
Admission: EM | Admit: 2013-05-15 | Discharge: 2013-05-21 | DRG: 189 | Disposition: A | Discharge status: Skilled Nursing Facility | Payer: Medicaid Other | Attending: Internal Medicine | Admitting: Internal Medicine

## 2013-05-15 ENCOUNTER — Emergency Department (HOSPITAL_COMMUNITY): Payer: Medicaid Other

## 2013-05-15 DIAGNOSIS — Z887 Allergy status to serum and vaccine status: Secondary | ICD-10-CM

## 2013-05-15 DIAGNOSIS — F319 Bipolar disorder, unspecified: Secondary | ICD-10-CM | POA: Diagnosis present

## 2013-05-15 DIAGNOSIS — I252 Old myocardial infarction: Secondary | ICD-10-CM

## 2013-05-15 DIAGNOSIS — Z9851 Tubal ligation status: Secondary | ICD-10-CM

## 2013-05-15 DIAGNOSIS — R42 Dizziness and giddiness: Secondary | ICD-10-CM

## 2013-05-15 DIAGNOSIS — G4733 Obstructive sleep apnea (adult) (pediatric): Secondary | ICD-10-CM

## 2013-05-15 DIAGNOSIS — K219 Gastro-esophageal reflux disease without esophagitis: Secondary | ICD-10-CM | POA: Diagnosis present

## 2013-05-15 DIAGNOSIS — Z888 Allergy status to other drugs, medicaments and biological substances status: Secondary | ICD-10-CM

## 2013-05-15 DIAGNOSIS — R7881 Bacteremia: Secondary | ICD-10-CM

## 2013-05-15 DIAGNOSIS — E119 Type 2 diabetes mellitus without complications: Secondary | ICD-10-CM | POA: Diagnosis present

## 2013-05-15 DIAGNOSIS — I739 Peripheral vascular disease, unspecified: Secondary | ICD-10-CM | POA: Diagnosis present

## 2013-05-15 DIAGNOSIS — D649 Anemia, unspecified: Secondary | ICD-10-CM | POA: Diagnosis present

## 2013-05-15 DIAGNOSIS — Z6841 Body Mass Index (BMI) 40.0 and over, adult: Secondary | ICD-10-CM

## 2013-05-15 DIAGNOSIS — I10 Essential (primary) hypertension: Secondary | ICD-10-CM | POA: Diagnosis present

## 2013-05-15 DIAGNOSIS — I959 Hypotension, unspecified: Secondary | ICD-10-CM

## 2013-05-15 DIAGNOSIS — Z880 Allergy status to penicillin: Secondary | ICD-10-CM

## 2013-05-15 DIAGNOSIS — D638 Anemia in other chronic diseases classified elsewhere: Secondary | ICD-10-CM

## 2013-05-15 DIAGNOSIS — T380X5A Adverse effect of glucocorticoids and synthetic analogues, initial encounter: Secondary | ICD-10-CM | POA: Diagnosis present

## 2013-05-15 DIAGNOSIS — J441 Chronic obstructive pulmonary disease with (acute) exacerbation: Secondary | ICD-10-CM

## 2013-05-15 DIAGNOSIS — Z9089 Acquired absence of other organs: Secondary | ICD-10-CM

## 2013-05-15 DIAGNOSIS — J9610 Chronic respiratory failure, unspecified whether with hypoxia or hypercapnia: Secondary | ICD-10-CM

## 2013-05-15 DIAGNOSIS — J309 Allergic rhinitis, unspecified: Secondary | ICD-10-CM

## 2013-05-15 DIAGNOSIS — J449 Chronic obstructive pulmonary disease, unspecified: Secondary | ICD-10-CM

## 2013-05-15 DIAGNOSIS — N179 Acute kidney failure, unspecified: Secondary | ICD-10-CM

## 2013-05-15 DIAGNOSIS — I890 Lymphedema, not elsewhere classified: Secondary | ICD-10-CM

## 2013-05-15 DIAGNOSIS — R0600 Dyspnea, unspecified: Secondary | ICD-10-CM

## 2013-05-15 DIAGNOSIS — J9620 Acute and chronic respiratory failure, unspecified whether with hypoxia or hypercapnia: Principal | ICD-10-CM | POA: Diagnosis present

## 2013-05-15 DIAGNOSIS — E662 Morbid (severe) obesity with alveolar hypoventilation: Secondary | ICD-10-CM | POA: Diagnosis present

## 2013-05-15 DIAGNOSIS — I2510 Atherosclerotic heart disease of native coronary artery without angina pectoris: Secondary | ICD-10-CM | POA: Diagnosis present

## 2013-05-15 DIAGNOSIS — E861 Hypovolemia: Secondary | ICD-10-CM

## 2013-05-15 DIAGNOSIS — R079 Chest pain, unspecified: Secondary | ICD-10-CM

## 2013-05-15 DIAGNOSIS — Z886 Allergy status to analgesic agent status: Secondary | ICD-10-CM

## 2013-05-15 DIAGNOSIS — E875 Hyperkalemia: Secondary | ICD-10-CM | POA: Diagnosis present

## 2013-05-15 DIAGNOSIS — E1149 Type 2 diabetes mellitus with other diabetic neurological complication: Secondary | ICD-10-CM

## 2013-05-15 DIAGNOSIS — Z91038 Other insect allergy status: Secondary | ICD-10-CM

## 2013-05-15 DIAGNOSIS — Z430 Encounter for attention to tracheostomy: Secondary | ICD-10-CM

## 2013-05-15 DIAGNOSIS — Z9981 Dependence on supplemental oxygen: Secondary | ICD-10-CM

## 2013-05-15 DIAGNOSIS — Z79899 Other long term (current) drug therapy: Secondary | ICD-10-CM

## 2013-05-15 DIAGNOSIS — I5031 Acute diastolic (congestive) heart failure: Secondary | ICD-10-CM

## 2013-05-15 DIAGNOSIS — Z930 Tracheostomy status: Secondary | ICD-10-CM

## 2013-05-15 LAB — BASIC METABOLIC PANEL
Calcium: 10 mg/dL (ref 8.4–10.5)
Chloride: 95 mEq/L — ABNORMAL LOW (ref 96–112)
GFR calc Af Amer: 88 mL/min — ABNORMAL LOW (ref >90)
GFR calc non Af Amer: 76 mL/min — ABNORMAL LOW (ref >90)
Glucose, Bld: 142 mg/dL — ABNORMAL HIGH (ref 70–99)
Potassium: 4.1 mEq/L (ref 3.5–5.1)
Sodium: 140 mEq/L (ref 135–145)

## 2013-05-15 LAB — CBC WITH DIFFERENTIAL/PLATELET
Eosinophils Absolute: 0.9 10*3/uL — ABNORMAL HIGH (ref 0.0–0.7)
HCT: 32.6 % — ABNORMAL LOW (ref 36.0–46.0)
Hemoglobin: 10.7 g/dL — ABNORMAL LOW (ref 12.0–15.0)
Lymphocytes Relative: 20 % (ref 12–46)
Lymphs Abs: 3.1 10*3/uL (ref 0.7–4.0)
MCH: 29.6 pg (ref 26.0–34.0)
Monocytes Absolute: 0.7 10*3/uL (ref 0.1–1.0)
Monocytes Relative: 5 % (ref 3–12)
Neutro Abs: 11 10*3/uL — ABNORMAL HIGH (ref 1.7–7.7)
Neutrophils Relative %: 70 % (ref 43–77)
RBC: 3.61 MIL/uL — ABNORMAL LOW (ref 3.87–5.11)

## 2013-05-15 MED ORDER — IPRATROPIUM BROMIDE 0.02 % IN SOLN
0.5000 mg | Freq: Once | RESPIRATORY_TRACT | Status: AC
  Administered 2013-05-15: 0.5 mg via RESPIRATORY_TRACT
  Filled 2013-05-15: qty 2.5

## 2013-05-15 MED ORDER — METHYLPREDNISOLONE SODIUM SUCC 125 MG IJ SOLR
80.0000 mg | Freq: Once | INTRAMUSCULAR | Status: AC
  Administered 2013-05-15: 80 mg via INTRAVENOUS
  Filled 2013-05-15: qty 2

## 2013-05-15 MED ORDER — ALBUTEROL SULFATE (5 MG/ML) 0.5% IN NEBU
5.0000 mg | INHALATION_SOLUTION | Freq: Once | RESPIRATORY_TRACT | Status: AC
  Administered 2013-05-15: 5 mg via RESPIRATORY_TRACT
  Filled 2013-05-15: qty 1

## 2013-05-15 NOTE — ED Notes (Signed)
Pt reports she has been having intermittent L sided chest pain that is non-radiating and sharp in nature x 3 days. Pt also verbalizes her tracheostomy tube has been dislodging. Pt denies SHOB, N/V, diaphoresis

## 2013-05-15 NOTE — Progress Notes (Signed)
Patient came in EMS trach dislodged, pt in no distress vs stable. Replaced trach with a #4 shiley cuffed, placement confirmed with BBS and ETCO2 color change.

## 2013-05-15 NOTE — ED Notes (Signed)
respiratory at bedside.

## 2013-05-15 NOTE — ED Notes (Signed)
EMS-pt is from [facility]. Reports trach become dislodged this evening. EMS reports O2 sats have been within normal limit. Pt appears in no distress at this time. RR are equal and unlabored.

## 2013-05-15 NOTE — ED Provider Notes (Signed)
CSN: [id number]     Arrival date & time 05/15/13  2203 History   First MD Initiated Contact with Patient 05/15/13 2252     Chief Complaint  Patient presents with  . Tracheostomy Tube Change   (Consider location/radiation/quality/duration/timing/severity/associated sxs/prior Treatment) HPI Comments: 62 yo female with bipolar, obesity, copd, chronic resp failure, DM, NH presents with sob and trach issues the past 24 hrs.  Worse at night when she went to go to sleep, requiring more O2 than normal 2-3 L [place].  Recent discharge from hospital.  Gradually worsening cough/ congestion/ fevers since.  Intermittent chest tightness anterior with cough, similar to previous, non radiating.  Full code.  Trach adjusted to smaller tube 2 wks prior.  Productive cough.  Nothing improves  The history is provided by the patient.    Past Medical History  Diagnosis Date  . COPD   . Hypertension   . Chronic respiratory failure     3 L oxygen  . GERD (gastroesophageal reflux disease)   . Diabetes mellitus   . Obesity, Class III, BMI 40-49.9 (morbid obesity)   . Obstructive sleep apnea on CPAP   . Peripheral vascular disease   . Bipolar disease, chronic   . Lymphedema   . COPD (chronic obstructive pulmonary disease)   . Cellulitis   . Coronary artery disease   . Asthma   . Myocardial infarction   . Vertigo    Past Surgical History  Procedure Laterality Date  . Abdominal hysterectomy    . Cholecystectomy    . Appendectomy    . Tubal ligation    . Tracheostomy     Family History  Problem Relation Age of Onset  . Prostate cancer     History  Substance Use Topics  . Smoking status: Never Smoker   . Smokeless tobacco: Never Used  . Alcohol Use: No   OB History   Grav Para Term Preterm Abortions TAB SAB Ect Mult Living                 Review of Systems  Constitutional: Positive for fever and appetite change. Negative for chills.  HENT: Positive for congestion.   Eyes: Negative for visual  disturbance.  Respiratory: Positive for cough and shortness of breath.   Cardiovascular: Positive for chest pain and leg swelling (chronic bilateral lower).  Gastrointestinal: Negative for vomiting and abdominal pain.  Genitourinary: Negative for dysuria and flank pain.  Musculoskeletal: Negative for back pain, neck pain and neck stiffness.  Skin: Negative for rash.  Neurological: Negative for light-headedness and headaches.    Allergies  Penicillins; Aspirin; Bee venom; Lisinopril; Nyquil cold &; and Ppd  Home Medications   Current Outpatient Rx  Name  Route  Sig  Dispense  Refill  . albuterol (PROVENTIL) (2.5 MG/3ML) 0.083% nebulizer solution   Nebulization   Take 3 mLs (2.5 mg total) by nebulization every 4 (four) hours as needed for wheezing.   75 mL   12   . amLODipine (NORVASC) 5 MG tablet   Oral   Take 5 mg by mouth daily.         . ARIPiprazole (ABILIFY) 10 MG tablet   Oral   Take 10 mg by mouth daily. For depressive psychosis         . ascorbic acid (VITAMIN C) 500 MG tablet   Oral   Take 500 mg by mouth 2 (two) times daily.         . budesonide-formoterol (SYMBICORT) 160-4.5  MCG/ACT inhaler   Inhalation   Inhale 2 puffs into the lungs 2 (two) times daily.         . busPIRone (BUSPAR) 10 MG tablet   Oral   Take 10 mg by mouth 2 (two) times daily.         . cloNIDine (CATAPRES) 0.2 MG tablet   Oral   Take 0.2 mg by mouth 2 (two) times daily.         . divalproex (DEPAKOTE ER) 250 MG 24 hr tablet   Oral   Take 250 mg by mouth 2 (two) times daily.         . DULoxetine (CYMBALTA) 60 MG capsule   Oral   Take 60 mg by mouth 2 (two) times daily.         . famotidine (PEPCID) 20 MG tablet   Oral   Take 20 mg by mouth 2 (two) times daily.         . fluticasone (FLONASE) 50 MCG/ACT nasal spray   Nasal   Place 2 sprays into the nose daily.         . isosorbide mononitrate (IMDUR) 30 MG 24 hr tablet   Oral   Take 30 mg by mouth  daily.         . Multiple Vitamins-Minerals (CERTAGEN PO)   Oral   Take 1 tablet by mouth daily.         . nitroGLYCERIN (NITROSTAT) 0.4 MG SL tablet   Sublingual   Place 0.4 mg under the tongue every 5 (five) minutes as needed for chest pain.          Marland Kitchen senna (SENOKOT) 8.6 MG TABS   Oral   Take 2 tablets by mouth daily.         . simethicone (MYLICON) 80 MG chewable tablet   Oral   Chew 80 mg by mouth every 8 (eight) hours.         Marland Kitchen tiotropium (SPIRIVA) 18 MCG inhalation capsule   Inhalation   Place 18 mcg into inhaler and inhale daily.         . vitamin D, CHOLECALCIFEROL, 400 UNITS tablet   Oral   Take 400 Units by mouth daily.          BP 155/125  Pulse 108  Temp(Src) 99.4 F (37.4 C) (Oral)  Resp 27  SpO2 97% Physical Exam  Nursing note and vitals reviewed. Constitutional: She is oriented to person, place, and time. She appears well-developed and well-nourished.  HENT:  Head: Normocephalic and atraumatic.  Eyes: Conjunctivae are normal. Right eye exhibits no discharge. Left eye exhibits no discharge.  Neck: Normal range of motion. Neck supple. No tracheal deviation present.  Cardiovascular: Normal rate and regular rhythm.   Pulmonary/Chest: She has wheezes (decr air movement bilat lower/ mid, exp wheeze). She has rales.  Abdominal: Soft. She exhibits no distension. There is no tenderness. There is no guarding.  Musculoskeletal: She exhibits no edema.  Neurological: She is alert and oriented to person, place, and time.  Skin: Skin is warm.  Focal superficial ulcer with oozing blood left lower posterior Chronic leg changes bilateral  Psychiatric: She has a normal mood and affect.    ED Course  Procedures (including critical care time) Labs Review Labs Reviewed  BASIC METABOLIC PANEL - Abnormal; Notable for the following:    Chloride 95 (*)    CO2 34 (*)    Glucose, Bld 142 (*)    GFR  calc non Af Amer 76 (*)    GFR calc Af Amer 88 (*)     All other components within normal limits  CBC WITH DIFFERENTIAL - Abnormal; Notable for the following:    WBC 15.6 (*)    RBC 3.61 (*)    Hemoglobin 10.7 (*)    HCT 32.6 (*)    Neutro Abs 11.0 (*)    Eosinophils Relative 6 (*)    Eosinophils Absolute 0.9 (*)    All other components within normal limits  PRO B NATRIURETIC PEPTIDE - Abnormal; Notable for the following:    Pro B Natriuretic peptide (BNP) 1744.0 (*)    All other components within normal limits  CBC WITH DIFFERENTIAL - Abnormal; Notable for the following:    RBC 3.73 (*)    Hemoglobin 11.1 (*)    HCT 33.7 (*)    All other components within normal limits  CULTURE, BLOOD (ROUTINE X 2)  CULTURE, BLOOD (ROUTINE X 2)  MRSA PCR SCREENING  TROPONIN I  TROPONIN I  TROPONIN I  TROPONIN I  INFLUENZA PANEL BY PCR  COMPREHENSIVE METABOLIC PANEL  VALPROIC ACID LEVEL  CBC  CBC  URINALYSIS, ROUTINE W REFLEX MICROSCOPIC   Imaging Review No results found.  EKG Interpretation     Ventricular Rate:  107 PR Interval:  159 QRS Duration: 66 QT Interval:  310 QTC Calculation: 413 R Axis:   -8 Text Interpretation:  Sinus tachycardia Ventricular trigeminy Probable left atrial enlargement Anteroseptal infarct, old Nonspecific T abnormalities, lateral leads T wave flattening worse inf/ lateral            MDM   1. Acute exacerbation of chronic obstructive pulmonary disease (COPD)   2. Chronic respiratory failure   3. Tracheostomy in place   4. Dyspnea   5. Chest pain   6. Essential hypertension, benign   7. Obstructive sleep apnea (adult) (pediatric)    Concern for copd exac/ pneumonia High risk recently hospitalization Duoneb// O2/ steroids.  Mild worsening EKG laterally.    WBC elevated, no focal pneumonia on xray.  Productive cough. Abx in ED.  Pt requiring increased O2 than normal. Discussed with triad, admission for acute copd exacerbation. Resp assisted with trach care/ suction in ED.   The patients  results and plan were reviewed and discussed.   Any x-rays performed were personally reviewed by myself.   Differential diagnosis were considered with the presenting HPI.   EKG: reviewed  Admission/ observation were discussed with the admitting physician, patient and/or family and they are comfortable with the plan.     Enid Skeens, MD 05/16/13 [id number]

## 2013-05-16 ENCOUNTER — Encounter (HOSPITAL_COMMUNITY): Payer: Self-pay | Admitting: Emergency Medicine

## 2013-05-16 DIAGNOSIS — Z930 Tracheostomy status: Secondary | ICD-10-CM

## 2013-05-16 DIAGNOSIS — R079 Chest pain, unspecified: Secondary | ICD-10-CM

## 2013-05-16 DIAGNOSIS — J9620 Acute and chronic respiratory failure, unspecified whether with hypoxia or hypercapnia: Secondary | ICD-10-CM | POA: Diagnosis present

## 2013-05-16 DIAGNOSIS — J441 Chronic obstructive pulmonary disease with (acute) exacerbation: Secondary | ICD-10-CM

## 2013-05-16 DIAGNOSIS — I10 Essential (primary) hypertension: Secondary | ICD-10-CM

## 2013-05-16 DIAGNOSIS — G4733 Obstructive sleep apnea (adult) (pediatric): Secondary | ICD-10-CM

## 2013-05-16 LAB — CBC WITH DIFFERENTIAL/PLATELET
Basophils Relative: 0 % (ref 0–1)
Eosinophils Absolute: 0 10*3/uL (ref 0.0–0.7)
Eosinophils Relative: 0 % (ref 0–5)
HCT: 33.7 % — ABNORMAL LOW (ref 36.0–46.0)
Hemoglobin: 11.1 g/dL — ABNORMAL LOW (ref 12.0–15.0)
Lymphocytes Relative: 6 % — ABNORMAL LOW (ref 12–46)
MCH: 29.8 pg (ref 26.0–34.0)
MCHC: 32.9 g/dL (ref 30.0–36.0)
MCV: 90.3 fL (ref 78.0–100.0)
Monocytes Absolute: 0.2 10*3/uL (ref 0.1–1.0)
Monocytes Relative: 1 % — ABNORMAL LOW (ref 3–12)
Neutrophils Relative %: 93 % — ABNORMAL HIGH (ref 43–77)
Platelets: 350 10*3/uL (ref 150–400)
RBC: 3.73 MIL/uL — ABNORMAL LOW (ref 3.87–5.11)

## 2013-05-16 LAB — COMPREHENSIVE METABOLIC PANEL
ALT: 17 U/L (ref 0–35)
AST: 23 U/L (ref 0–37)
Albumin: 2.8 g/dL — ABNORMAL LOW (ref 3.5–5.2)
BUN: 13 mg/dL (ref 6–23)
CO2: 29 mEq/L (ref 19–32)
Chloride: 96 mEq/L (ref 96–112)
GFR calc Af Amer: 84 mL/min — ABNORMAL LOW (ref >90)
GFR calc non Af Amer: 72 mL/min — ABNORMAL LOW (ref >90)
Potassium: 5.3 mEq/L — ABNORMAL HIGH (ref 3.5–5.1)
Sodium: 139 mEq/L (ref 135–145)
Total Bilirubin: 0.2 mg/dL — ABNORMAL LOW (ref 0.3–1.2)
Total Protein: 8.4 g/dL — ABNORMAL HIGH (ref 6.0–8.3)

## 2013-05-16 LAB — PRO B NATRIURETIC PEPTIDE: Pro B Natriuretic peptide (BNP): 1744 pg/mL — ABNORMAL HIGH (ref 0–125)

## 2013-05-16 LAB — GLUCOSE, CAPILLARY
Glucose-Capillary: 182 mg/dL — ABNORMAL HIGH (ref 70–99)
Glucose-Capillary: 144 mg/dL — ABNORMAL HIGH (ref 70–99)

## 2013-05-16 LAB — INFLUENZA PANEL BY PCR (TYPE A & B)
Influenza A By PCR: NEGATIVE
Influenza B By PCR: NEGATIVE

## 2013-05-16 LAB — MRSA PCR SCREENING: MRSA by PCR: NEGATIVE

## 2013-05-16 LAB — PROCALCITONIN: Procalcitonin: <0.1 ng/mL

## 2013-05-16 LAB — VALPROIC ACID LEVEL: Valproic Acid Lvl: 16.5 ug/mL — ABNORMAL LOW (ref 50.0–100.0)

## 2013-05-16 MED ORDER — VITAMIN C 500 MG PO TABS
500.0000 mg | ORAL_TABLET | Freq: Two times a day (BID) | ORAL | Status: DC
  Administered 2013-05-16 – 2013-05-21 (×11): 500 mg via ORAL
  Filled 2013-05-16 (×12): qty 1

## 2013-05-16 MED ORDER — INSULIN ASPART 100 UNIT/ML ~~LOC~~ SOLN
0.0000 [IU] | Freq: Three times a day (TID) | SUBCUTANEOUS | Status: DC
  Administered 2013-05-16: 2 [IU] via SUBCUTANEOUS
  Administered 2013-05-16 – 2013-05-17 (×2): 1 [IU] via SUBCUTANEOUS
  Administered 2013-05-17: 3 [IU] via SUBCUTANEOUS
  Administered 2013-05-18 (×2): 2 [IU] via SUBCUTANEOUS
  Administered 2013-05-18 – 2013-05-21 (×3): 1 [IU] via SUBCUTANEOUS

## 2013-05-16 MED ORDER — ENOXAPARIN SODIUM 60 MG/0.6ML ~~LOC~~ SOLN
60.0000 mg | SUBCUTANEOUS | Status: DC
  Administered 2013-05-16: 60 mg via SUBCUTANEOUS
  Filled 2013-05-16 (×2): qty 0.6

## 2013-05-16 MED ORDER — DIVALPROEX SODIUM ER 250 MG PO TB24
250.0000 mg | ORAL_TABLET | Freq: Two times a day (BID) | ORAL | Status: DC
  Administered 2013-05-16 – 2013-05-21 (×11): 250 mg via ORAL
  Filled 2013-05-16 (×12): qty 1

## 2013-05-16 MED ORDER — CLONIDINE HCL 0.2 MG PO TABS
0.2000 mg | ORAL_TABLET | Freq: Two times a day (BID) | ORAL | Status: DC
  Administered 2013-05-16 – 2013-05-21 (×11): 0.2 mg via ORAL
  Filled 2013-05-16 (×12): qty 1

## 2013-05-16 MED ORDER — IPRATROPIUM BROMIDE 0.02 % IN SOLN: 0.5000 mg | RESPIRATORY_TRACT | Status: DC | PRN

## 2013-05-16 MED ORDER — ACETAMINOPHEN 650 MG RE SUPP: 650.0000 mg | Freq: Four times a day (QID) | RECTAL | Status: DC | PRN

## 2013-05-16 MED ORDER — SENNA 8.6 MG PO TABS
2.0000 | ORAL_TABLET | Freq: Every day | ORAL | Status: DC
  Administered 2013-05-16 – 2013-05-21 (×6): 17.2 mg via ORAL
  Filled 2013-05-16 (×6): qty 2

## 2013-05-16 MED ORDER — BUSPIRONE HCL 10 MG PO TABS
10.0000 mg | ORAL_TABLET | Freq: Two times a day (BID) | ORAL | Status: DC
  Administered 2013-05-16 – 2013-05-21 (×11): 10 mg via ORAL
  Filled 2013-05-16 (×12): qty 1

## 2013-05-16 MED ORDER — INSULIN ASPART 100 UNIT/ML ~~LOC~~ SOLN: 0.0000 [IU] | Freq: Every day | SUBCUTANEOUS | Status: DC

## 2013-05-16 MED ORDER — DEXTROSE 5 % IV SOLN
1.0000 g | Freq: Three times a day (TID) | INTRAVENOUS | Status: DC
  Administered 2013-05-16 – 2013-05-20 (×13): 1 g via INTRAVENOUS
  Filled 2013-05-16 (×18): qty 1

## 2013-05-16 MED ORDER — NITROGLYCERIN 0.4 MG SL SUBL: 0.4000 mg | SUBLINGUAL_TABLET | SUBLINGUAL | Status: DC | PRN

## 2013-05-16 MED ORDER — ACETAMINOPHEN 325 MG PO TABS
650.0000 mg | ORAL_TABLET | Freq: Four times a day (QID) | ORAL | Status: DC | PRN
  Administered 2013-05-19 – 2013-05-21 (×2): 650 mg via ORAL
  Filled 2013-05-16 (×2): qty 2

## 2013-05-16 MED ORDER — VANCOMYCIN HCL IN DEXTROSE 750-5 MG/150ML-% IV SOLN
750.0000 mg | Freq: Two times a day (BID) | INTRAVENOUS | Status: DC
  Administered 2013-05-16: 750 mg via INTRAVENOUS
  Filled 2013-05-16: qty 150

## 2013-05-16 MED ORDER — CHOLECALCIFEROL 10 MCG (400 UNIT) PO TABS
400.0000 [IU] | ORAL_TABLET | Freq: Every day | ORAL | Status: DC
  Administered 2013-05-16 – 2013-05-21 (×6): 400 [IU] via ORAL
  Filled 2013-05-16 (×6): qty 1

## 2013-05-16 MED ORDER — PREDNISONE 20 MG PO TABS
40.0000 mg | ORAL_TABLET | Freq: Every day | ORAL | Status: DC
  Administered 2013-05-17 – 2013-05-18 (×2): 40 mg via ORAL
  Filled 2013-05-16 (×3): qty 2

## 2013-05-16 MED ORDER — SODIUM CHLORIDE 0.9 % IJ SOLN
3.0000 mL | Freq: Two times a day (BID) | INTRAMUSCULAR | Status: DC
  Administered 2013-05-16: 3 mL via INTRAVENOUS

## 2013-05-16 MED ORDER — BUDESONIDE 0.25 MG/2ML IN SUSP
0.2500 mg | Freq: Two times a day (BID) | RESPIRATORY_TRACT | Status: DC
  Administered 2013-05-16 – 2013-05-21 (×11): 0.25 mg via RESPIRATORY_TRACT
  Filled 2013-05-16 (×13): qty 2

## 2013-05-16 MED ORDER — LEVOFLOXACIN IN D5W 750 MG/150ML IV SOLN
750.0000 mg | Freq: Once | INTRAVENOUS | Status: AC
  Administered 2013-05-16: 750 mg via INTRAVENOUS
  Filled 2013-05-16: qty 150

## 2013-05-16 MED ORDER — VANCOMYCIN HCL IN DEXTROSE 750-5 MG/150ML-% IV SOLN
750.0000 mg | Freq: Two times a day (BID) | INTRAVENOUS | Status: DC
  Administered 2013-05-16 – 2013-05-20 (×9): 750 mg via INTRAVENOUS
  Filled 2013-05-16 (×10): qty 150

## 2013-05-16 MED ORDER — SIMETHICONE 80 MG PO CHEW
80.0000 mg | CHEWABLE_TABLET | Freq: Three times a day (TID) | ORAL | Status: DC
  Administered 2013-05-16 – 2013-05-21 (×16): 80 mg via ORAL
  Filled 2013-05-16 (×20): qty 1

## 2013-05-16 MED ORDER — DULOXETINE HCL 60 MG PO CPEP
60.0000 mg | ORAL_CAPSULE | Freq: Two times a day (BID) | ORAL | Status: DC
  Administered 2013-05-16 – 2013-05-21 (×11): 60 mg via ORAL
  Filled 2013-05-16 (×12): qty 1

## 2013-05-16 MED ORDER — AMLODIPINE BESYLATE 5 MG PO TABS
5.0000 mg | ORAL_TABLET | Freq: Every day | ORAL | Status: DC
  Administered 2013-05-16 – 2013-05-18 (×3): 5 mg via ORAL
  Filled 2013-05-16 (×3): qty 1

## 2013-05-16 MED ORDER — SODIUM CHLORIDE 0.9 % IJ SOLN: 10.0000 mL | INTRAMUSCULAR | Status: DC | PRN

## 2013-05-16 MED ORDER — FAMOTIDINE 20 MG PO TABS
20.0000 mg | ORAL_TABLET | Freq: Two times a day (BID) | ORAL | Status: DC
  Administered 2013-05-16 – 2013-05-21 (×11): 20 mg via ORAL
  Filled 2013-05-16 (×12): qty 1

## 2013-05-16 MED ORDER — ONDANSETRON HCL 4 MG PO TABS: 4.0000 mg | ORAL_TABLET | Freq: Four times a day (QID) | ORAL | Status: DC | PRN

## 2013-05-16 MED ORDER — ISOSORBIDE MONONITRATE ER 30 MG PO TB24
30.0000 mg | ORAL_TABLET | Freq: Every day | ORAL | Status: DC
  Administered 2013-05-16 – 2013-05-21 (×6): 30 mg via ORAL
  Filled 2013-05-16 (×6): qty 1

## 2013-05-16 MED ORDER — ALBUTEROL SULFATE (5 MG/ML) 0.5% IN NEBU
5.0000 mg | INHALATION_SOLUTION | RESPIRATORY_TRACT | Status: DC | PRN
  Administered 2013-05-18: 5 mg via RESPIRATORY_TRACT
  Filled 2013-05-16: qty 1

## 2013-05-16 MED ORDER — SODIUM CHLORIDE 0.9 % IJ SOLN
10.0000 mL | Freq: Two times a day (BID) | INTRAMUSCULAR | Status: DC
  Administered 2013-05-16: 20 mL
  Administered 2013-05-16 – 2013-05-17 (×2): 10 mL
  Administered 2013-05-17: 20 mL
  Administered 2013-05-18 – 2013-05-19 (×4): 10 mL
  Administered 2013-05-20: 20 mL
  Administered 2013-05-21: 10 mL
  Filled 2013-05-16: qty 10

## 2013-05-16 MED ORDER — ONDANSETRON HCL 4 MG/2ML IJ SOLN: 4.0000 mg | Freq: Four times a day (QID) | INTRAMUSCULAR | Status: DC | PRN

## 2013-05-16 MED ORDER — FLUTICASONE PROPIONATE 50 MCG/ACT NA SUSP
2.0000 | Freq: Every day | NASAL | Status: DC
  Administered 2013-05-16 – 2013-05-21 (×6): 2 via NASAL
  Filled 2013-05-16: qty 16

## 2013-05-16 MED ORDER — ARIPIPRAZOLE 10 MG PO TABS
10.0000 mg | ORAL_TABLET | Freq: Every day | ORAL | Status: DC
  Administered 2013-05-16 – 2013-05-21 (×5): 10 mg via ORAL
  Filled 2013-05-16 (×7): qty 1

## 2013-05-16 NOTE — Progress Notes (Signed)
Clinical Social Work Department BRIEF PSYCHOSOCIAL ASSESSMENT 05/16/2013  Patient:  [name], [name]     Account Number:  [id number]     Admit date:  05/15/2013  Clinical Social Worker:  Leron Croak, CLINICAL SOCIAL WORKER  Date/Time:  05/16/2013 11:18 AM  Referred by:  Physician  Date Referred:  05/16/2013 Referred for  SNF Placement   Other Referral:   Interview type:  Patient Other interview type:    PSYCHOSOCIAL DATA Living Status:  FACILITY Admitted from facility:  [facility] Level of care:  Skilled Nursing Facility Primary support name:  Arelia Longest [phone] Primary support relationship to patient:  CHILD, ADULT Degree of support available:   Pt has good support system from daughter and facility.    CURRENT CONCERNS Current Concerns  Post-Acute Placement   Other Concerns:    SOCIAL WORK ASSESSMENT / PLAN BSW student and CSW met with Pt at the bedside. BSW student introduce herself and informed the Pt that  the BSW student was there to see if she was going back to [facility] since she was from there. Pt states that "she is/wants to go back to [facility]".    Pt has been at [facility] for 9-10years and enjoy staying there.    BSW student will contact [facility] to let them know Pt will be goiing back after d/c.    BSW student will follow-up with Pt regarding d/c.   Assessment/plan status:  Information/Referral to [facility] Other assessment/ plan:   Information/referral to community resources:   No addictional resources given at this time.    PATIENT'S/FAMILY'S RESPONSE TO PLAN OF CARE: Pt is very appreciative of services given.      Leron Croak LCSWA  [facility]

## 2013-05-16 NOTE — Progress Notes (Signed)
Peripherally Inserted Central Catheter/Midline Placement  The IV Nurse has discussed with the patient and/or persons authorized to consent for the patient, the purpose of this procedure and the potential benefits and risks involved with this procedure.  The benefits include less needle sticks, lab draws from the catheter and patient may be discharged home with the catheter.  Risks include, but not limited to, infection, bleeding, blood clot (thrombus formation), and puncture of an artery; nerve damage and irregular heat beat.  Alternatives to this procedure were also discussed.  PICC/Midline Placement Documentation        [name] 05/16/2013, 10:36 AM

## 2013-05-16 NOTE — Progress Notes (Signed)
Utilization review completed.  

## 2013-05-16 NOTE — Progress Notes (Signed)
ANTIBIOTIC CONSULT NOTE - INITIAL  Pharmacy Consult for Vancomycin/Aztreonam Indication: rule out pneumonia  Allergies  Allergen Reactions  . Penicillins Anaphylaxis    Tongue swelling  . Aspirin Other (See Comments)    Per MAR  . Bee Venom Other (See Comments)    Unknown  . Lisinopril     Per MAR  . Nyquil Cold & [Dm-Doxylamine-Acetaminophen] Other (See Comments)    Unknown   . Ppd [Tuberculin Purified Protein Derivative] Other (See Comments)    unknown    Patient Measurements: Height: 5\' 1"  (154.9 cm) Weight: 282 lb 13.6 oz (128.3 kg) IBW/kg (Calculated) : 47.8  Vital Signs: Temp: 99.2 F (37.3 C) (11/13 0337) Temp src: Oral (11/13 0337) BP: 154/74 mmHg (11/13 0328) Pulse Rate: 101 (11/13 0349)  Labs:  Recent Labs  05/15/13 2320  WBC 15.6*  HGB 10.7*  PLT 341  CREATININE 0.81   Estimated Creatinine Clearance: 89.8 ml/min (by C-G formula based on Cr of 0.81).  Microbiology: Recent Results (from the past 720 hour(s))  CULTURE, BLOOD (ROUTINE X 2)     Status: None   Collection Time    04/30/13  4:15 PM      Result Value Range Status   Specimen Description BLOOD LEFT ARM   Final   Special Requests BOTTLES DRAWN AEROBIC ONLY   Final   Culture  Setup Time     Final   Value: 04/30/2013 21:38     Performed at [facility]   Culture     Final   Value: NO GROWTH 5 DAYS     Performed at [facility]   Report Status 05/06/2013 FINAL   Final  CULTURE, BLOOD (ROUTINE X 2)     Status: None   Collection Time    04/30/13  4:50 PM      Result Value Range Status   Specimen Description BLOOD LEFT HAND   Final   Special Requests BOTTLES DRAWN AEROBIC ONLY 3CC   Final   Culture  Setup Time     Final   Value: 04/30/2013 21:38     Performed at [facility]   Culture     Final   Value: NO GROWTH 5 DAYS     Performed at [facility]   Report Status 05/06/2013 FINAL   Final  URINE CULTURE     Status: None   Collection Time     04/30/13  6:58 PM      Result Value Range Status   Specimen Description URINE, CATHETERIZED   Final   Special Requests NONE   Final   Culture  Setup Time     Final   Value: 05/01/2013 02:40     Performed at [name] Count     Final   Value: NO GROWTH     Performed at [facility]   Culture     Final   Value: NO GROWTH     Performed at [facility]   Report Status 05/02/2013 FINAL   Final  CULTURE, RESPIRATORY (NON-EXPECTORATED)     Status: None   Collection Time    05/01/13  8:57 AM      Result Value Range Status   Specimen Description TRACHEAL ASPIRATE   Final   Special Requests NONE   Final   Gram Stain     Final   Value: MODERATE WBC PRESENT, PREDOMINANTLY PMN     RARE SQUAMOUS EPITHELIAL CELLS PRESENT  NO ORGANISMS SEEN     Performed at [facility]   Culture     Final   Value: MODERATE YEAST CONSISTENT WITH CANDIDA SPECIES     Performed at [facility]   Report Status 05/03/2013 FINAL   Final    Medical History: Past Medical History  Diagnosis Date  . COPD   . Hypertension   . Chronic respiratory failure     3 L oxygen  . GERD (gastroesophageal reflux disease)   . Diabetes mellitus   . Obesity, Class III, BMI 40-49.9 (morbid obesity)   . Obstructive sleep apnea on CPAP   . Peripheral vascular disease   . Bipolar disease, chronic   . Lymphedema   . COPD (chronic obstructive pulmonary disease)   . Cellulitis   . Coronary artery disease   . Asthma   . Myocardial infarction   . Vertigo    Assessment: 62 y/o F here from NH with trach problems, SOB, to start vancomycin/aztreonam (PCN anaphylaxis) for possible PNA. WBC elevated at 15.6, renal function ok, afebrile, got Levaquin x 1 in the ED.   Vancomycin history 10/4 VT = 17 mcg/mL (SCr 0.72, 1gm q12h)  10/30 VT = 21.2 mcg/mL (SCr 1.12, 750 mg Q 12h)  Goal of Therapy:  Vancomycin trough level 15-20 mcg/ml  Plan:  -Vancomycin 750 mg IV  q12h -Aztreonam 1g IV q8h -Trend WBC, temp, renal function  -Vancomycin trough as indicated   Thank you for allowing me to take part in this patient's care,  Abran Duke, PharmD Clinical Pharmacist Phone: [phone] Pager: [phone] 05/16/2013 5:05 AM

## 2013-05-16 NOTE — H&P (Signed)
[facility] History and Physical  [name] XBJ:[id number] DOB: [birth year] DOA: 05/15/2013  Referring physician: ER physician PCP: Karlene Einstein, MD   Chief Complaint: Shortness of breath.  HPI: [name] is a 62 y.o. female who was recently admitted for acute hypercarbic respiratory failure and was intubated and subsequently had tracheostomy placed was brought from the nursing home after patient was complaining of shortness of breath. In the ER patient's trach was changed by respiratory. Patient states she's been short of breath for last 3 days with pleuritic type of chest pain productive cough fever chills. Denies any nausea vomiting abdominal pain or diarrhea. In the ER patient was found to be wheezing and was given nebulizer steroids and antibiotics and has been admitted for further workup. Chest x-ray done shows improving infiltrates compared to the old. In addition patient's labs showed leukocytosis and elevated BNP.   Review of Systems: As presented in the history of presenting illness, rest negative.  Past Medical History  Diagnosis Date  . COPD   . Hypertension   . Chronic respiratory failure     3 L oxygen  . GERD (gastroesophageal reflux disease)   . Diabetes mellitus   . Obesity, Class III, BMI 40-49.9 (morbid obesity)   . Obstructive sleep apnea on CPAP   . Peripheral vascular disease   . Bipolar disease, chronic   . Lymphedema   . COPD (chronic obstructive pulmonary disease)   . Cellulitis   . Coronary artery disease   . Asthma   . Myocardial infarction   . Vertigo    Past Surgical History  Procedure Laterality Date  . Abdominal hysterectomy    . Cholecystectomy    . Appendectomy    . Tubal ligation    . Tracheostomy     Social History:  reports that she has never smoked. She has never used smokeless tobacco. She reports that she does not drink alcohol or use illicit drugs. Where does patient live nursing home. Can patient  participate in ADLs? Not sure.  Allergies  Allergen Reactions  . Penicillins Anaphylaxis    Tongue swelling  . Aspirin Other (See Comments)    Per MAR  . Bee Venom Other (See Comments)    Unknown  . Lisinopril     Per MAR  . Nyquil Cold & [Dm-Doxylamine-Acetaminophen] Other (See Comments)    Unknown   . Ppd [Tuberculin Purified Protein Derivative] Other (See Comments)    unknown    Family History:  Family History  Problem Relation Age of Onset  . Prostate cancer        Prior to Admission medications   Medication Sig Start Date End Date Taking? Authorizing Provider  albuterol (PROVENTIL) (2.5 MG/3ML) 0.083% nebulizer solution Take 3 mLs (2.5 mg total) by nebulization every 4 (four) hours as needed for wheezing. 05/09/13  Yes Jeanella Craze, NP  amLODipine (NORVASC) 5 MG tablet Take 5 mg by mouth daily.   Yes Historical Provider, MD  ARIPiprazole (ABILIFY) 10 MG tablet Take 10 mg by mouth daily. For depressive psychosis   Yes Historical Provider, MD  ascorbic acid (VITAMIN C) 500 MG tablet Take 500 mg by mouth 2 (two) times daily.   Yes Historical Provider, MD  budesonide-formoterol (SYMBICORT) 160-4.5 MCG/ACT inhaler Inhale 2 puffs into the lungs 2 (two) times daily.   Yes Historical Provider, MD  busPIRone (BUSPAR) 10 MG tablet Take 10 mg by mouth 2 (two) times daily.   Yes Historical Provider, MD  cloNIDine (CATAPRES)  0.2 MG tablet Take 0.2 mg by mouth 2 (two) times daily.   Yes Historical Provider, MD  divalproex (DEPAKOTE ER) 250 MG 24 hr tablet Take 250 mg by mouth 2 (two) times daily.   Yes Historical Provider, MD  DULoxetine (CYMBALTA) 60 MG capsule Take 60 mg by mouth 2 (two) times daily.   Yes Historical Provider, MD  famotidine (PEPCID) 20 MG tablet Take 20 mg by mouth 2 (two) times daily.   Yes Historical Provider, MD  fluticasone (FLONASE) 50 MCG/ACT nasal spray Place 2 sprays into the nose daily.   Yes Historical Provider, MD  isosorbide mononitrate (IMDUR) 30 MG 24  hr tablet Take 30 mg by mouth daily.   Yes Historical Provider, MD  Multiple Vitamins-Minerals (CERTAGEN PO) Take 1 tablet by mouth daily.   Yes Historical Provider, MD  nitroGLYCERIN (NITROSTAT) 0.4 MG SL tablet Place 0.4 mg under the tongue every 5 (five) minutes as needed for chest pain.    Yes Historical Provider, MD  senna (SENOKOT) 8.6 MG TABS Take 2 tablets by mouth daily.   Yes Historical Provider, MD  simethicone (MYLICON) 80 MG chewable tablet Chew 80 mg by mouth every 8 (eight) hours.   Yes Historical Provider, MD  tiotropium (SPIRIVA) 18 MCG inhalation capsule Place 18 mcg into inhaler and inhale daily.   Yes Historical Provider, MD  vitamin D, CHOLECALCIFEROL, 400 UNITS tablet Take 400 Units by mouth daily.   Yes Historical Provider, MD    Physical Exam: Filed Vitals:   05/16/13 0245 05/16/13 0328 05/16/13 0337 05/16/13 0349  BP: 128/97 154/74    Pulse: 101 101  101  Temp: 98.4 F (36.9 C)  99.2 F (37.3 C)   TempSrc: Oral  Oral   Resp: 23 29  23   Height:  5\' 1"  (1.549 m)    Weight:  128.3 kg (282 lb 13.6 oz)    SpO2: 98% 91%  94%     General:  Well-developed and nourished.  Eyes: Anicteric no pallor. Mild congestion of the right eye.  ENT: No discharge from the ears eyes nose mouth.  Neck: Tracheostomy in place.  Cardiovascular: S1-S2 heard.  Respiratory: Presently no rhonchi or crepitations.  Abdomen: Soft nontender bowel sounds present.  Skin: No rash. Chronic skin changes in the lower extremities.  Musculoskeletal: No edema.  Psychiatric: Appears normal.  Neurologic: Alert awake oriented to time place and person. Moves all extremities.  Labs on Admission:  Basic Metabolic Panel:  Recent Labs Lab 05/15/13 2320  NA 140  K 4.1  CL 95*  CO2 34*  GLUCOSE 142*  BUN 11  CREATININE 0.81  CALCIUM 10.0   Liver Function Tests: No results found for this basename: AST, ALT, ALKPHOS, BILITOT, PROT, ALBUMIN,  in the last 168 hours No results found for  this basename: LIPASE, AMYLASE,  in the last 168 hours No results found for this basename: AMMONIA,  in the last 168 hours CBC:  Recent Labs Lab 05/15/13 2320  WBC 15.6*  NEUTROABS 11.0*  HGB 10.7*  HCT 32.6*  MCV 90.3  PLT 341   Cardiac Enzymes:  Recent Labs Lab 05/15/13 2320  TROPONINI <0.30    BNP (last 3 results)  Recent Labs  01/19/13 0231 04/03/13 2040 05/15/13 2320  PROBNP 1633.0* 387.1* 1744.0*   CBG:  Recent Labs Lab 05/09/13 0720 05/09/13 1118 05/09/13 1634  GLUCAP 109* 113* 138*    Radiological Exams on Admission: Dg Chest Port 1 View  05/16/2013   CLINICAL  DATA:  Shortness of breath.  EXAM: PORTABLE CHEST - 1 VIEW  COMPARISON:  Chest radiograph performed 05/05/2013  FINDINGS: The patient's tracheostomy tube is seen ending 6-7 cm above the carina.  The lungs remain hypoexpanded. Vascular crowding is noted. Mild bibasilar atelectasis is seen; previously noted airspace opacities have resolved. No definite pleural effusion or pneumothorax is identified.  The cardiomediastinal silhouette is mildly enlarged. No acute osseous abnormalities are seen.  IMPRESSION: Lungs remain hypoexpanded; mild bibasilar atelectasis noted. Previously noted bilateral airspace opacities have resolved. Mild cardiomegaly noted.   Electronically Signed   By: Roanna Raider M.D.   On: 05/16/2013 00:32     Assessment/Plan Active Problems:   OBSTRUCTIVE SLEEP APNEA   Acute exacerbation of chronic obstructive pulmonary disease (COPD)   Tracheostomy in place   Chest pain   1. Acute exacerbation of COPD - presently patient's wheezing has improved. Continue with nebulizer antibiotics. Patient's BNP also was elevated. Recent 2-D echo done last month showed EF of 60-65%. Closely observe. 2. Fever - probably from tracheobronchitis. Check influenza PCR. Continue with empiric antibiotics. 3. Pleuritic type of chest pain - EKG is pending. Cycle cardiac markers. Chest pain probably from  bronchitis. 4. OSA - presently has had tracheostomy. 5. Hypertension - continue home medications. 6. Chronic anemia - follow CBC. 7. History of depression - check Depakote levels. 8. Recently admitted for hypercarbic respiratory failure.    Code Status: Full code.  Family Communication: None.  Disposition Plan: Admit to inpatient.    Minda Faas N. [facility] Pager [phone].  If 7PM-7AM, please contact night-coverage [email or web address] Password [facility] 05/16/2013, 4:41 AM

## 2013-05-16 NOTE — Progress Notes (Signed)
IV infiltrated and tried to restart into left hand without success, site unremarkable.  IV team notified.  Continue to watch.

## 2013-05-16 NOTE — Progress Notes (Addendum)
62 y/o female admitted from a nursing facility for dyspnea. H/o chronic trach. Admitted recently to ICU for HCAP. She was found to have wheezing in the ER and was treated with steroids, nebs and antibiotics. CXR reveals improvement from last admission. Pt states she feels much better.    Principal Problem:   Respiratory failure, acute and chronic - improved - cont Vanc and Azactam for now - obtain Procalcitonin - influenza negative   Active Problems:    Acute exacerbation of chronic obstructive pulmonary disease (COPD) - cont steroids- will switch to Prednisone  Hyperkalemia - recheck now  Hyperglycemia- from steroids? -cont sliding scale- check A1c  Leukocytosis - due to underlying infection and superimposed steroids?     Tracheostomy in place    Morbid obesity    BIPOLAR DISORDER UNSPECIFIED    OBSTRUCTIVE SLEEP APNEA  Calvert Cantor, MD [phone]

## 2013-05-17 LAB — BASIC METABOLIC PANEL
BUN: 22 mg/dL (ref 6–23)
CO2: 30 mEq/L (ref 19–32)
Calcium: 9.3 mg/dL (ref 8.4–10.5)
Glucose, Bld: 110 mg/dL — ABNORMAL HIGH (ref 70–99)
Potassium: 4.2 mEq/L (ref 3.5–5.1)
Sodium: 142 mEq/L (ref 135–145)

## 2013-05-17 LAB — GLUCOSE, CAPILLARY
Glucose-Capillary: 143 mg/dL — ABNORMAL HIGH (ref 70–99)
Glucose-Capillary: 135 mg/dL — ABNORMAL HIGH (ref 70–99)
Glucose-Capillary: 201 mg/dL — ABNORMAL HIGH (ref 70–99)
Glucose-Capillary: 159 mg/dL — ABNORMAL HIGH (ref 70–99)

## 2013-05-17 LAB — HEMOGLOBIN A1C: Mean Plasma Glucose: 126 mg/dL — ABNORMAL HIGH (ref <117)

## 2013-05-17 MED ORDER — HEPARIN SODIUM (PORCINE) 5000 UNIT/ML IJ SOLN
5000.0000 [IU] | Freq: Three times a day (TID) | INTRAMUSCULAR | Status: DC
  Administered 2013-05-17 – 2013-05-21 (×12): 5000 [IU] via SUBCUTANEOUS
  Filled 2013-05-17 (×15): qty 1

## 2013-05-17 NOTE — Progress Notes (Signed)
[facility] Progress Note [place] TEAM 1 - Stepdown/ICU TEAM   [name] ZOX:[id number] DOB: 11/26/1950 DOA: 05/15/2013 PCP: Karlene Einstein, MD  Admit HPI / Brief Narrative: 62 y.o. female who was admitted October 2014 for acute hypercarbic respiratory failure and was intubated and subsequently had tracheostomy placed was brought from the nursing home after patient was complaining of shortness of breath. In the ER the patient's trach was changed by respiratory with some difficulty. Patient stated she'd been short of breath for last 3 days with pleuritic type of chest pain, productive cough, fever, and chills.  In the ER patient was found to be wheezing and was given nebulizer, steroids, and antibiotics. Chest x-ray revealed improved infiltrates compared to the old.   Assessment/Plan:  Respiratory failure, acute on chronic  - had an episode today worrisome for tach malposition and heavy secretions - PCCM repositioned trach and suctioned w/ marked improvement  - influenza negative  - doubt true PNA, but may well be suffering with tracheobronchitis  - cont empiric abx for now - culture sputum  Acute exacerbation of chronic obstructive pulmonary disease (COPD)  - cont steroids - improved today w/ no noted wheezing   Hyperkalemia  - resolved   Hyperglycemia - steroid induced  - A1c not c/w true DM - follow w/ temporary SSI prn  Tracheostomy  Placed October 2014 by PCCM [name])  Morbid obesity - Body mass index is 53.47 kg/(m^2).  Bipolar D/O  OHS / OBSTRUCTIVE SLEEP APNEA  Code Status: FULL Family Communication: no family present at time of exam Disposition Plan: SDU until resp status more stable   Consultants: none  Procedures: none  Antibiotics: Aztreonam 11/12 >> Vanc 11/12 >>  DVT prophylaxis: lovenox  HPI/Subjective: Upon entering the room the patient is somewhat panicked and appears to be in respiratory distress.  The pulmonary physician  arrived, repositioned her trach, and suctioned her through her trach.  Her respirations then became much more stable.  She denies chest pain fevers chills nausea or vomiting.  Objective: Blood pressure 126/84, pulse 88, temperature 98.1 F (36.7 C), temperature source Oral, resp. rate 33, height 5\' 1"  (1.549 m), weight 128.3 kg (282 lb 13.6 oz), SpO2 93.00%.  Intake/Output Summary (Last 24 hours) at 05/17/13 1142 Last data filed at 05/17/13 1014  Gross per 24 hour  Intake   1280 ml  Output    325 ml  Net    955 ml   Exam: General: Transient respiratory distress due to trach malposition and significant secretions rapidly resolved Lungs: Poor air movement throughout all fields with no wheeze appreciable today but coarse upper airway sounds noted Cardiovascular: Regular rate and rhythm without murmur gallop or rub normal S1 and S2 Abdomen: Nontender, nondistended, soft, bowel sounds positive, no rebound, no ascites, no appreciable mass - obese Extremities: No significant cyanosis, clubbing;  1+ edema bilateral lower extremities  Data Reviewed: Basic Metabolic Panel:  Recent Labs Lab 05/15/13 2320 05/16/13 0603 05/16/13 1637 05/17/13 0452  NA 140 139  --  142  K 4.1 5.3* 4.6 4.2  CL 95* 96  --  99  CO2 34* 29  --  30  GLUCOSE 142* 165*  --  110*  BUN 11 13  --  22  CREATININE 0.81 0.84  --  0.92  CALCIUM 10.0 10.1  --  9.3   Liver Function Tests:  Recent Labs Lab 05/16/13 0603  AST 23  ALT 17  ALKPHOS 79  BILITOT 0.2*  PROT 8.4*  ALBUMIN 2.8*   CBC:  Recent Labs Lab 05/15/13 2320 05/16/13 0603  WBC 15.6* 16.2*  NEUTROABS 11.0* 15.0*  HGB 10.7* 11.1*  HCT 32.6* 33.7*  MCV 90.3 90.3  PLT 341 350   Cardiac Enzymes:  Recent Labs Lab 05/15/13 2320 05/16/13 0603 05/16/13 1000 05/16/13 1637  TROPONINI <0.30 <0.30 <0.30 <0.30   BNP (last 3 results)  Recent Labs  01/19/13 0231 04/03/13 2040 05/15/13 2320  PROBNP 1633.0* 387.1* 1744.0*    CBG:  Recent Labs Lab 05/16/13 1148 05/16/13 1636 05/16/13 2123 05/17/13 0009 05/17/13 0814  GLUCAP 182* 144* 143* 92 115*    Recent Results (from the past 240 hour(s))  CULTURE, BLOOD (ROUTINE X 2)     Status: None   Collection Time    05/16/13 12:10 AM      Result Value Range Status   Specimen Description BLOOD RIGHT ARM   Final   Special Requests BOTTLES DRAWN AEROBIC AND ANAEROBIC 10CC   Final   Culture  Setup Time     Final   Value: 05/16/2013 09:41     Performed at [facility]   Culture     Final   Value:        BLOOD CULTURE RECEIVED NO GROWTH TO DATE CULTURE WILL BE HELD FOR 5 DAYS BEFORE ISSUING A FINAL NEGATIVE REPORT     Performed at [facility]   Report Status PENDING   Incomplete  CULTURE, BLOOD (ROUTINE X 2)     Status: None   Collection Time    05/16/13 12:20 AM      Result Value Range Status   Specimen Description BLOOD LEFT ARM   Final   Special Requests BOTTLES DRAWN AEROBIC ONLY 10CC   Final   Culture  Setup Time     Final   Value: 05/16/2013 09:41     Performed at [facility]   Culture     Final   Value:        BLOOD CULTURE RECEIVED NO GROWTH TO DATE CULTURE WILL BE HELD FOR 5 DAYS BEFORE ISSUING A FINAL NEGATIVE REPORT     Performed at [facility]   Report Status PENDING   Incomplete  MRSA PCR SCREENING     Status: None   Collection Time    05/16/13  3:31 AM      Result Value Range Status   MRSA by PCR NEGATIVE  NEGATIVE Final   Comment:            The GeneXpert MRSA Assay (FDA     approved for NASAL specimens     only), is one component of a     comprehensive MRSA colonization     surveillance program. It is not     intended to diagnose MRSA     infection nor to guide or     monitor treatment for     MRSA infections.     Studies:  Recent x-ray studies have been reviewed in detail by the Attending Physician  Scheduled Meds:  Scheduled Meds: . amLODipine  5 mg Oral Daily  . ARIPiprazole  10 mg  Oral Daily  . aztreonam  1 g Intravenous Q8H  . budesonide (PULMICORT) nebulizer solution  0.25 mg Nebulization BID  . busPIRone  10 mg Oral BID  . cholecalciferol  400 Units Oral Daily  . cloNIDine  0.2 mg Oral BID  . divalproex  250 mg Oral BID  . DULoxetine  60  mg Oral BID  . enoxaparin (LOVENOX) injection  60 mg Subcutaneous Q24H  . famotidine  20 mg Oral BID  . fluticasone  2 spray Each Nare Daily  . insulin aspart  0-5 Units Subcutaneous QHS  . insulin aspart  0-9 Units Subcutaneous TID WC  . isosorbide mononitrate  30 mg Oral Daily  . predniSONE  40 mg Oral Q breakfast  . senna  2 tablet Oral Daily  . simethicone  80 mg Oral Q8H  . sodium chloride  10-40 mL Intracatheter Q12H  . vancomycin  750 mg Intravenous Q12H  . ascorbic acid  500 mg Oral BID    Time spent on care of this patient: 35 mins   [facility]  [facility] Office  [phone] Pager - Text Page per Loretha Stapler as per below:  On-Call/Text Page:      [email or web address]      password [email or web address]  If 7PM-7AM, please contact night-coverage [email or web address] Password [email or web address] 05/17/2013, 11:42 AM   LOS: 2 days

## 2013-05-17 NOTE — Progress Notes (Signed)
eLink Physician-Brief Progress Note Patient Name: [name] DOB: Dec 17, 1950 MRN: [id number]  Date of Service  05/17/2013   HPI/Events of Note   No dvt proph ordered  eICU Interventions  Hep sq ordered    Intervention Category Intermediate Interventions: Best-practice therapies (e.g. DVT, beta blocker, etc.)  Shan Levans 05/17/2013, 8:59 PM

## 2013-05-17 NOTE — Progress Notes (Signed)
Pt seen by trach team. Plan is for pt to discharge back to SNF with trach. No education needed at this time. All appropriate equipment at bedside. Will continue to follow.

## 2013-05-18 LAB — BASIC METABOLIC PANEL
Chloride: 100 mEq/L (ref 96–112)
Creatinine, Ser: 0.8 mg/dL (ref 0.50–1.10)
GFR calc Af Amer: 89 mL/min — ABNORMAL LOW (ref >90)
GFR calc non Af Amer: 77 mL/min — ABNORMAL LOW (ref >90)
Glucose, Bld: 101 mg/dL — ABNORMAL HIGH (ref 70–99)
Potassium: 4.4 mEq/L (ref 3.5–5.1)

## 2013-05-18 LAB — CBC
MCHC: 32 g/dL (ref 30.0–36.0)
Platelets: 318 10*3/uL (ref 150–400)
RDW: 14.5 % (ref 11.5–15.5)
WBC: 11.2 10*3/uL — ABNORMAL HIGH (ref 4.0–10.5)

## 2013-05-18 LAB — GLUCOSE, CAPILLARY
Glucose-Capillary: 151 mg/dL — ABNORMAL HIGH (ref 70–99)
Glucose-Capillary: 141 mg/dL — ABNORMAL HIGH (ref 70–99)

## 2013-05-18 MED ORDER — AMLODIPINE BESYLATE 10 MG PO TABS
10.0000 mg | ORAL_TABLET | Freq: Every day | ORAL | Status: DC
  Administered 2013-05-19 – 2013-05-21 (×3): 10 mg via ORAL
  Filled 2013-05-18 (×3): qty 1

## 2013-05-18 MED ORDER — PREDNISONE 20 MG PO TABS
20.0000 mg | ORAL_TABLET | Freq: Every day | ORAL | Status: DC
  Administered 2013-05-19: 20 mg via ORAL
  Filled 2013-05-18 (×2): qty 1

## 2013-05-18 NOTE — Progress Notes (Signed)
Speech Language Pathology   Patient Details Name: [name] MRN: [id number] DOB: [birth year] Today's Date: 05/18/2013 Time:  -    ST order due pt. "lost her speaking valve".  SLP arrived and PMSV was donned and pt. Stated she "found it".  MD at bedside and order cancelled.  Breck Coons [place].Ed [employer] [phone]  05/18/2013

## 2013-05-18 NOTE — Progress Notes (Signed)
[facility] Progress Note [place] TEAM 1 - Stepdown/ICU TEAM   [name] ZOX:[id number] DOB: 06/16/1951 DOA: 05/15/2013 PCP: Karlene Einstein, MD  Admit HPI / Brief Narrative: 62 y.o. female who was admitted October 2014 for acute hypercarbic respiratory failure and was intubated and subsequently had tracheostomy placed was brought from the nursing home after patient was complaining of shortness of breath. In the ER the patient's trach was changed by respiratory with some difficulty. Patient stated she'd been short of breath for last 3 days with pleuritic type of chest pain, productive cough, fever, and chills.  In the ER patient was found to be wheezing and was given nebulizer, steroids, and antibiotics. Chest x-ray revealed improved infiltrates compared to the prior admission.   Assessment/Plan:  Respiratory failure, acute on chronic - 3L O2 dependent at baseline - had an episode 11/14 worrisome for trach malposition and heavy secretions - PCCM repositioned trach and suctioned w/ marked improvement  - influenza negative  - doubt true PNA, but may well be suffering with tracheobronchitis  - cont empiric abx for now - culture sputum  Acute exacerbation of chronic obstructive pulmonary disease (COPD)  - cont steroids - improved w/ no noted wheezing   Hyperkalemia  - resolved   Hyperglycemia - steroid induced  - A1c not c/w true DM - follow w/ temporary SSI prn  Tracheostomy  Placed October 2014 by PCCM Molli Knock)  Morbid obesity - Body mass index is 53.97 kg/(m^2).  Bipolar D/O  OHS / OBSTRUCTIVE SLEEP APNEA  Code Status: FULL Family Communication: no family present at time of exam Disposition Plan: SDU    Consultants: none  Procedures: none  Antibiotics: Aztreonam 11/12 >> Vanc 11/12 >>  DVT prophylaxis: SQ heparin  HPI/Subjective: The patient is in good spirits today/joking with staff.  She denies any complaints.  Her respirations appear much  more comfortable.  Objective: Blood pressure 154/67, pulse 82, temperature 98.5 F (36.9 C), temperature source Oral, resp. rate 28, height 5\' 1"  (1.549 m), weight 129.5 kg (285 lb 7.9 oz), SpO2 95.00%.  Intake/Output Summary (Last 24 hours) at 05/18/13 1508 Last data filed at 05/17/13 2008  Gross per 24 hour  Intake    480 ml  Output    200 ml  Net    280 ml   Exam: General: Comfortable respirations at the present time Lungs: Distant breath sounds in all fields with no focal crackles or wheeze Cardiovascular: Regular rate and rhythm without murmur gallop or rub normal S1 and S2 Abdomen: Nontender, nondistended, soft, bowel sounds positive, no rebound, no ascites, no appreciable mass - obese Extremities: No significant cyanosis, clubbing;  1+ edema bilateral lower extremities  Data Reviewed: Basic Metabolic Panel:  Recent Labs Lab 05/15/13 2320 05/16/13 0603 05/16/13 1637 05/17/13 0452 05/18/13 0456  NA 140 139  --  142 140  K 4.1 5.3* 4.6 4.2 4.4  CL 95* 96  --  99 100  CO2 34* 29  --  30 33*  GLUCOSE 142* 165*  --  110* 101*  BUN 11 13  --  22 23  CREATININE 0.81 0.84  --  0.92 0.80  CALCIUM 10.0 10.1  --  9.3 9.7   Liver Function Tests:  Recent Labs Lab 05/16/13 0603  AST 23  ALT 17  ALKPHOS 79  BILITOT 0.2*  PROT 8.4*  ALBUMIN 2.8*   CBC:  Recent Labs Lab 05/15/13 2320 05/16/13 0603 05/18/13 0456  WBC 15.6* 16.2* 11.2*  NEUTROABS 11.0* 15.0*  --  HGB 10.7* 11.1* 9.5*  HCT 32.6* 33.7* 29.7*  MCV 90.3 90.3 92.2  PLT 341 350 318   Cardiac Enzymes:  Recent Labs Lab 05/15/13 2320 05/16/13 0603 05/16/13 1000 05/16/13 1637  TROPONINI <0.30 <0.30 <0.30 <0.30   BNP (last 3 results)  Recent Labs  01/19/13 0231 04/03/13 2040 05/15/13 2320  PROBNP 1633.0* 387.1* 1744.0*   CBG:  Recent Labs Lab 05/17/13 1143 05/17/13 1623 05/17/13 2059 05/18/13 0740 05/18/13 1132  GLUCAP 135* 201* 159* 186* 151*    Recent Results (from the past  240 hour(s))  CULTURE, BLOOD (ROUTINE X 2)     Status: None   Collection Time    05/16/13 12:10 AM      Result Value Range Status   Specimen Description BLOOD RIGHT ARM   Final   Special Requests BOTTLES DRAWN AEROBIC AND ANAEROBIC 10CC   Final   Culture  Setup Time     Final   Value: 05/16/2013 09:41     Performed at [facility]   Culture     Final   Value:        BLOOD CULTURE RECEIVED NO GROWTH TO DATE CULTURE WILL BE HELD FOR 5 DAYS BEFORE ISSUING A FINAL NEGATIVE REPORT     Performed at [facility]   Report Status PENDING   Incomplete  CULTURE, BLOOD (ROUTINE X 2)     Status: None   Collection Time    05/16/13 12:20 AM      Result Value Range Status   Specimen Description BLOOD LEFT ARM   Final   Special Requests BOTTLES DRAWN AEROBIC ONLY 10CC   Final   Culture  Setup Time     Final   Value: 05/16/2013 09:41     Performed at [facility]   Culture     Final   Value:        BLOOD CULTURE RECEIVED NO GROWTH TO DATE CULTURE WILL BE HELD FOR 5 DAYS BEFORE ISSUING A FINAL NEGATIVE REPORT     Performed at [facility]   Report Status PENDING   Incomplete  MRSA PCR SCREENING     Status: None   Collection Time    05/16/13  3:31 AM      Result Value Range Status   MRSA by PCR NEGATIVE  NEGATIVE Final   Comment:            The GeneXpert MRSA Assay (FDA     approved for NASAL specimens     only), is one component of a     comprehensive MRSA colonization     surveillance program. It is not     intended to diagnose MRSA     infection nor to guide or     monitor treatment for     MRSA infections.     Studies:  Recent x-ray studies have been reviewed in detail by the Attending Physician  Scheduled Meds:  Scheduled Meds: . amLODipine  5 mg Oral Daily  . ARIPiprazole  10 mg Oral Daily  . aztreonam  1 g Intravenous Q8H  . budesonide (PULMICORT) nebulizer solution  0.25 mg Nebulization BID  . busPIRone  10 mg Oral BID  . cholecalciferol   400 Units Oral Daily  . cloNIDine  0.2 mg Oral BID  . divalproex  250 mg Oral BID  . DULoxetine  60 mg Oral BID  . famotidine  20 mg Oral BID  . fluticasone  2 spray Each  Nare Daily  . heparin subcutaneous  5,000 Units Subcutaneous Q8H  . insulin aspart  0-5 Units Subcutaneous QHS  . insulin aspart  0-9 Units Subcutaneous TID WC  . isosorbide mononitrate  30 mg Oral Daily  . predniSONE  40 mg Oral Q breakfast  . senna  2 tablet Oral Daily  . simethicone  80 mg Oral Q8H  . sodium chloride  10-40 mL Intracatheter Q12H  . vancomycin  750 mg Intravenous Q12H  . ascorbic acid  500 mg Oral BID    Time spent on care of this patient: 25 mins   [facility] T  [facility] Office  [phone] Pager - Text Page per Loretha Stapler as per below:  On-Call/Text Page:      [email or web address]      password [email or web address]  If 7PM-7AM, please contact night-coverage [email or web address] Password [email or web address] 05/18/2013, 3:08 PM   LOS: 3 days

## 2013-05-19 ENCOUNTER — Inpatient Hospital Stay (HOSPITAL_COMMUNITY): Payer: Medicaid Other

## 2013-05-19 LAB — GLUCOSE, CAPILLARY
Glucose-Capillary: 84 mg/dL (ref 70–99)
Glucose-Capillary: 113 mg/dL — ABNORMAL HIGH (ref 70–99)

## 2013-05-19 LAB — VANCOMYCIN, TROUGH: Vancomycin Tr: 15.6 ug/mL (ref 10.0–20.0)

## 2013-05-19 MED ORDER — PREDNISONE 10 MG PO TABS
10.0000 mg | ORAL_TABLET | Freq: Every day | ORAL | Status: DC
  Administered 2013-05-20 – 2013-05-21 (×2): 10 mg via ORAL
  Filled 2013-05-19 (×3): qty 1

## 2013-05-19 MED ORDER — DIPHENHYDRAMINE HCL 25 MG PO CAPS: 25.0000 mg | ORAL_CAPSULE | Freq: Four times a day (QID) | ORAL | Status: DC | PRN

## 2013-05-19 NOTE — Progress Notes (Signed)
[facility] Progress Note [place] TEAM 1 - Stepdown/ICU TEAM   [name] AVW:[id number] DOB: 1950/09/02 DOA: 05/15/2013 PCP: Karlene Einstein, MD  Admit HPI / Brief Narrative: 62 y.o. female who was admitted October 2014 for acute hypercarbic respiratory failure and was intubated and subsequently had tracheostomy placed was brought from the nursing home after patient was complaining of shortness of breath. In the ER the patient's trach was changed by respiratory with some difficulty. Patient stated she'd been short of breath for last 3 days with pleuritic type of chest pain, productive cough, fever, and chills.  In the ER patient was found to be wheezing and was given nebulizer, steroids, and antibiotics. Chest x-ray revealed improved infiltrates compared to the prior admission.   Assessment/Plan:  Respiratory failure, acute on chronic - 3L O2 dependent at baseline - had an episode 11/14 worrisome for trach malposition and heavy secretions - PCCM repositioned trach and suctioned w/ marked improvement  - influenza negative  - doubt true PNA, but may well be suffering with tracheobronchitis  - cont empiric abx for now - culture sputum ordered   Acute exacerbation of chronic obstructive pulmonary disease (COPD)  - cont steroids - improved w/ no noted wheezing - rapid steroid taper to off  Hyperkalemia  - resolved   Hyperglycemia - steroid induced  - A1c not c/w true DM - follow w/ temporary SSI prn  Tracheostomy  Placed October 2014 by PCCM Molli Knock) - to f/u in [facility]   Morbid obesity - Body mass index is 54.01 kg/(m^2).  Bipolar D/O  OHS / OBSTRUCTIVE SLEEP APNEA  Code Status: FULL Family Communication: no family present at time of exam Disposition Plan: SDU  - plan to return to SNF in AM 11/17  Consultants: none  Procedures: none  Antibiotics: Aztreonam 11/12 >> Vanc 11/12 >>  DVT prophylaxis: SQ heparin  HPI/Subjective: The patient  denies any complaints.  Her respirations appear comfortable.  Objective: Blood pressure 136/76, pulse 78, temperature 98.3 F (36.8 C), temperature source Oral, resp. rate 24, height 5\' 1"  (1.549 m), weight 129.6 kg (285 lb 11.5 oz), SpO2 96.00%.  Intake/Output Summary (Last 24 hours) at 05/19/13 1449 Last data filed at 05/19/13 0618  Gross per 24 hour  Intake    370 ml  Output    550 ml  Net   -180 ml   Exam: General: Comfortable respirations  Lungs: Distant breath sounds in all fields with no focal crackles or wheeze Cardiovascular: Regular rate and rhythm without murmur gallop or rub normal S1 and S2 Abdomen: Nontender, nondistended, soft, bowel sounds positive, no rebound, no ascites, no appreciable mass - obese Extremities: No significant cyanosis, clubbing;  trace edema bilateral lower extremities  Data Reviewed: Basic Metabolic Panel:  Recent Labs Lab 05/15/13 2320 05/16/13 0603 05/16/13 1637 05/17/13 0452 05/18/13 0456  NA 140 139  --  142 140  K 4.1 5.3* 4.6 4.2 4.4  CL 95* 96  --  99 100  CO2 34* 29  --  30 33*  GLUCOSE 142* 165*  --  110* 101*  BUN 11 13  --  22 23  CREATININE 0.81 0.84  --  0.92 0.80  CALCIUM 10.0 10.1  --  9.3 9.7   Liver Function Tests:  Recent Labs Lab 05/16/13 0603  AST 23  ALT 17  ALKPHOS 79  BILITOT 0.2*  PROT 8.4*  ALBUMIN 2.8*   CBC:  Recent Labs Lab 05/15/13 2320 05/16/13 0603 05/18/13 0456  WBC 15.6* 16.2* 11.2*  NEUTROABS 11.0* 15.0*  --   HGB 10.7* 11.1* 9.5*  HCT 32.6* 33.7* 29.7*  MCV 90.3 90.3 92.2  PLT 341 350 318   Cardiac Enzymes:  Recent Labs Lab 05/15/13 2320 05/16/13 0603 05/16/13 1000 05/16/13 1637  TROPONINI <0.30 <0.30 <0.30 <0.30   BNP (last 3 results)  Recent Labs  01/19/13 0231 04/03/13 2040 05/15/13 2320  PROBNP 1633.0* 387.1* 1744.0*   CBG:  Recent Labs Lab 05/18/13 1132 05/18/13 1711 05/18/13 2212 05/19/13 0815 05/19/13 1146  GLUCAP 151* 141* 152* 84 113*     Recent Results (from the past 240 hour(s))  CULTURE, BLOOD (ROUTINE X 2)     Status: None   Collection Time    05/16/13 12:10 AM      Result Value Range Status   Specimen Description BLOOD RIGHT ARM   Final   Special Requests BOTTLES DRAWN AEROBIC AND ANAEROBIC 10CC   Final   Culture  Setup Time     Final   Value: 05/16/2013 09:41     Performed at [facility]   Culture     Final   Value:        BLOOD CULTURE RECEIVED NO GROWTH TO DATE CULTURE WILL BE HELD FOR 5 DAYS BEFORE ISSUING A FINAL NEGATIVE REPORT     Performed at [facility]   Report Status PENDING   Incomplete  CULTURE, BLOOD (ROUTINE X 2)     Status: None   Collection Time    05/16/13 12:20 AM      Result Value Range Status   Specimen Description BLOOD LEFT ARM   Final   Special Requests BOTTLES DRAWN AEROBIC ONLY 10CC   Final   Culture  Setup Time     Final   Value: 05/16/2013 09:41     Performed at [facility]   Culture     Final   Value:        BLOOD CULTURE RECEIVED NO GROWTH TO DATE CULTURE WILL BE HELD FOR 5 DAYS BEFORE ISSUING A FINAL NEGATIVE REPORT     Performed at [facility]   Report Status PENDING   Incomplete  MRSA PCR SCREENING     Status: None   Collection Time    05/16/13  3:31 AM      Result Value Range Status   MRSA by PCR NEGATIVE  NEGATIVE Final   Comment:            The GeneXpert MRSA Assay (FDA     approved for NASAL specimens     only), is one component of a     comprehensive MRSA colonization     surveillance program. It is not     intended to diagnose MRSA     infection nor to guide or     monitor treatment for     MRSA infections.     Studies:  Recent x-ray studies have been reviewed in detail by the Attending Physician  Scheduled Meds:  Scheduled Meds: . amLODipine  10 mg Oral Daily  . ARIPiprazole  10 mg Oral Daily  . aztreonam  1 g Intravenous Q8H  . budesonide (PULMICORT) nebulizer solution  0.25 mg Nebulization BID  . busPIRone   10 mg Oral BID  . cholecalciferol  400 Units Oral Daily  . cloNIDine  0.2 mg Oral BID  . divalproex  250 mg Oral BID  . DULoxetine  60 mg Oral BID  . famotidine  20 mg Oral BID  . fluticasone  2 spray Each Nare Daily  . heparin subcutaneous  5,000 Units Subcutaneous Q8H  . insulin aspart  0-5 Units Subcutaneous QHS  . insulin aspart  0-9 Units Subcutaneous TID WC  . isosorbide mononitrate  30 mg Oral Daily  . predniSONE  20 mg Oral Q breakfast  . senna  2 tablet Oral Daily  . simethicone  80 mg Oral Q8H  . sodium chloride  10-40 mL Intracatheter Q12H  . vancomycin  750 mg Intravenous Q12H  . ascorbic acid  500 mg Oral BID    Time spent on care of this patient: 25 mins   [facility] T  [facility] Office  [phone] Pager - Text Page per Loretha Stapler as per below:  On-Call/Text Page:      [email or web address]      password [email or web address]  If 7PM-7AM, please contact night-coverage [email or web address] Password [email or web address] 05/19/2013, 2:49 PM   LOS: 4 days

## 2013-05-19 NOTE — Progress Notes (Signed)
ANTIBIOTIC CONSULT NOTE - INITIAL  Pharmacy Consult for Vancomycin/Aztreonam Indication: rule out pneumonia  Allergies  Allergen Reactions  . Penicillins Anaphylaxis    Tongue swelling  . Aspirin Other (See Comments)    Per MAR  . Bee Venom Other (See Comments)    Unknown  . Lisinopril     Per MAR  . Nyquil Cold & [Dm-Doxylamine-Acetaminophen] Other (See Comments)    Unknown   . Ppd [Tuberculin Purified Protein Derivative] Other (See Comments)    unknown    Patient Measurements: Height: 5\' 1"  (154.9 cm) Weight: 285 lb 11.5 oz (129.6 kg) IBW/kg (Calculated) : 47.8  Vital Signs: Temp: 98.4 F (36.9 C) (11/16 0738) Temp src: Oral (11/16 0738) BP: 159/73 mmHg (11/16 0900) Pulse Rate: 90 (11/16 0900)  Labs:  Recent Labs  05/17/13 0452 05/18/13 0456  WBC  --  11.2*  HGB  --  9.5*  PLT  --  318  CREATININE 0.92 0.80   Estimated Creatinine Clearance: 91.5 ml/min (by C-G formula based on Cr of 0.8).  Microbiology: Recent Results (from the past 720 hour(s))  CULTURE, BLOOD (ROUTINE X 2)     Status: None   Collection Time    04/30/13  4:15 PM      Result Value Range Status   Specimen Description BLOOD LEFT ARM   Final   Special Requests BOTTLES DRAWN AEROBIC ONLY   Final   Culture  Setup Time     Final   Value: 04/30/2013 21:38     Performed at [facility]   Culture     Final   Value: NO GROWTH 5 DAYS     Performed at [facility]   Report Status 05/06/2013 FINAL   Final  CULTURE, BLOOD (ROUTINE X 2)     Status: None   Collection Time    04/30/13  4:50 PM      Result Value Range Status   Specimen Description BLOOD LEFT HAND   Final   Special Requests BOTTLES DRAWN AEROBIC ONLY 3CC   Final   Culture  Setup Time     Final   Value: 04/30/2013 21:38     Performed at [facility]   Culture     Final   Value: NO GROWTH 5 DAYS     Performed at [facility]   Report Status 05/06/2013 FINAL   Final  URINE CULTURE     Status:  None   Collection Time    04/30/13  6:58 PM      Result Value Range Status   Specimen Description URINE, CATHETERIZED   Final   Special Requests NONE   Final   Culture  Setup Time     Final   Value: 05/01/2013 02:40     Performed at [name] Count     Final   Value: NO GROWTH     Performed at [facility]   Culture     Final   Value: NO GROWTH     Performed at [facility]   Report Status 05/02/2013 FINAL   Final  CULTURE, RESPIRATORY (NON-EXPECTORATED)     Status: None   Collection Time    05/01/13  8:57 AM      Result Value Range Status   Specimen Description TRACHEAL ASPIRATE   Final   Special Requests NONE   Final   Gram Stain     Final   Value: MODERATE WBC  PRESENT, PREDOMINANTLY PMN     RARE SQUAMOUS EPITHELIAL CELLS PRESENT     NO ORGANISMS SEEN     Performed at [facility]   Culture     Final   Value: MODERATE YEAST CONSISTENT WITH CANDIDA SPECIES     Performed at [facility]   Report Status 05/03/2013 FINAL   Final  CULTURE, BLOOD (ROUTINE X 2)     Status: None   Collection Time    05/16/13 12:10 AM      Result Value Range Status   Specimen Description BLOOD RIGHT ARM   Final   Special Requests BOTTLES DRAWN AEROBIC AND ANAEROBIC 10CC   Final   Culture  Setup Time     Final   Value: 05/16/2013 09:41     Performed at [facility]   Culture     Final   Value:        BLOOD CULTURE RECEIVED NO GROWTH TO DATE CULTURE WILL BE HELD FOR 5 DAYS BEFORE ISSUING A FINAL NEGATIVE REPORT     Performed at [facility]   Report Status PENDING   Incomplete  CULTURE, BLOOD (ROUTINE X 2)     Status: None   Collection Time    05/16/13 12:20 AM      Result Value Range Status   Specimen Description BLOOD LEFT ARM   Final   Special Requests BOTTLES DRAWN AEROBIC ONLY 10CC   Final   Culture  Setup Time     Final   Value: 05/16/2013 09:41     Performed at [facility]   Culture     Final   Value:         BLOOD CULTURE RECEIVED NO GROWTH TO DATE CULTURE WILL BE HELD FOR 5 DAYS BEFORE ISSUING A FINAL NEGATIVE REPORT     Performed at [facility]   Report Status PENDING   Incomplete  MRSA PCR SCREENING     Status: None   Collection Time    05/16/13  3:31 AM      Result Value Range Status   MRSA by PCR NEGATIVE  NEGATIVE Final   Comment:            The GeneXpert MRSA Assay (FDA     approved for NASAL specimens     only), is one component of a     comprehensive MRSA colonization     surveillance program. It is not     intended to diagnose MRSA     infection nor to guide or     monitor treatment for     MRSA infections.    Medical History: Past Medical History  Diagnosis Date  . COPD   . Hypertension   . Chronic respiratory failure     3 L oxygen  . GERD (gastroesophageal reflux disease)   . Diabetes mellitus   . Obesity, Class III, BMI 40-49.9 (morbid obesity)   . Obstructive sleep apnea on CPAP   . Peripheral vascular disease   . Bipolar disease, chronic   . Lymphedema   . COPD (chronic obstructive pulmonary disease)   . Cellulitis   . Coronary artery disease   . Asthma   . Myocardial infarction   . Vertigo    Assessment: 62 y/o F here from NH presented with trach issues, SOB, and is now currently receiving  vancomycin/aztreonam Day#4 (PCN anaphylaxis) for possible PNA.  A vancomycin trough was drawn this AM while at steady state.  The level is at the lower end of therapeutic with a result of 15.6.  Renal function is stable.  A sputum culture has been ordered, but not collected yet.  Goal of Therapy:  Vancomycin trough level 15-20 mcg/ml  Plan:  - continue vancomycin 750 mg IV q12h - continue aztreonam 1g IV q8h - f/u WBC, temp, renal function, and clinical progression - f/u length of therapy and/or de-escalation of antibiotics  Harrold Donath E. Achilles Dunk, PharmD Clinical Pharmacist - Resident Pager: [phone] Pharmacy: [phone] 05/19/2013 9:48 AM

## 2013-05-20 DIAGNOSIS — J9620 Acute and chronic respiratory failure, unspecified whether with hypoxia or hypercapnia: Principal | ICD-10-CM

## 2013-05-20 DIAGNOSIS — Z930 Tracheostomy status: Secondary | ICD-10-CM

## 2013-05-20 LAB — GLUCOSE, CAPILLARY
Glucose-Capillary: 86 mg/dL (ref 70–99)
Glucose-Capillary: 105 mg/dL — ABNORMAL HIGH (ref 70–99)
Glucose-Capillary: 136 mg/dL — ABNORMAL HIGH (ref 70–99)
Glucose-Capillary: 137 mg/dL — ABNORMAL HIGH (ref 70–99)

## 2013-05-20 MED ORDER — ARFORMOTEROL TARTRATE 15 MCG/2ML IN NEBU
15.0000 ug | INHALATION_SOLUTION | Freq: Two times a day (BID) | RESPIRATORY_TRACT | Status: DC
  Administered 2013-05-20 – 2013-05-21 (×2): 15 ug via RESPIRATORY_TRACT
  Filled 2013-05-20 (×4): qty 2

## 2013-05-20 NOTE — Progress Notes (Signed)
Utilization review completed.  

## 2013-05-20 NOTE — Progress Notes (Signed)
Upon entering patient's room this morning, this nurse observed that patient's trach was partially dislodged.  Airway was maintained and respiratory was called.  [name] was replaced with obturator by respiratory.  Md has been notified.  Will continue to monitor.  Vivi Martens RN

## 2013-05-20 NOTE — Progress Notes (Signed)
[facility] Progress Note [place] TEAM 1 - Stepdown/ICU TEAM   HEATHERLY STENNER ZOX:[id number] DOB: [birth year] DOA: 05/15/2013 PCP: Karlene Einstein, MD  Admit HPI / Brief Narrative: 62 y.o. female who was admitted October 2014 for acute hypercarbic respiratory failure and was intubated and subsequently had tracheostomy placed was brought from the nursing home after patient was complaining of shortness of breath. In the ER the patient's trach was changed by respiratory with some difficulty. Patient stated she'd been short of breath for last 3 days with pleuritic type of chest pain, productive cough, fever, and chills.  In the ER patient was found to be wheezing and was given nebulizer, steroids, and antibiotics. Chest x-ray revealed improved infiltrates compared to the prior admission.   Assessment/Plan:  Respiratory failure, acute on chronic - 3L O2 dependent at baseline - had an episode 11/14 worrisome for trach malposition and heavy secretions - PCCM repositioned trach and suctioned w/ marked improvement  - influenza negative  - doubt true PNA, but may well be suffering with tracheobronchitis  - cont empiric abx for now - culture sputum ordered  - Plan was for return to skilled nursing facility today but trach became dislodged again this morning - we'll therefore postpone transfer and request pulmonary evaluation for further recommendations  Acute exacerbation of chronic obstructive pulmonary disease (COPD)  - cont steroids - improved w/ no noted wheezing - rapid steroid taper to off  Hyperkalemia  - resolved   Hyperglycemia - steroid induced  - A1c not c/w true DM - follow w/ temporary SSI prn  Tracheostomy  Placed October 2014 by PCCM Molli Knock) - to f/u in [facility] - see discussion above   Morbid obesity - Body mass index is 54.18 kg/(m^2).  Bipolar D/O  OHS / OBSTRUCTIVE SLEEP APNEA  Code Status: FULL Family Communication: Spoke with patient and  brother at bedside Disposition Plan: SDU  - plan to return to SNF in AM 11/18 his trach remains stable  Consultants: Pulmonary  Procedures: none  Antibiotics: Aztreonam 11/12 >>11/17 Vanc 11/12 >>11/17  DVT prophylaxis: SQ heparin  HPI/Subjective: The patient denies any complaints.  Her respirations appear comfortable.  Her trach did become dislodged this morning requiring urgent repositioning by nursing care/RRT.  Objective: Blood pressure 140/61, pulse 79, temperature 98.2 F (36.8 C), temperature source Oral, resp. rate 22, height 5\' 1"  (1.549 m), weight 130 kg (286 lb 9.6 oz), SpO2 98.00%.  Intake/Output Summary (Last 24 hours) at 05/20/13 1337 Last data filed at 05/20/13 1048  Gross per 24 hour  Intake    130 ml  Output    900 ml  Net   -770 ml   Exam: General: Comfortable respirations  Lungs: Distant breath sounds in all fields with no focal crackles or wheeze Cardiovascular: Regular rate and rhythm without murmur gallop or rub normal S1 and S2 Abdomen: Nontender, nondistended, soft, bowel sounds positive, no rebound, no ascites, no appreciable mass - obese Extremities: No significant cyanosis, clubbing;  trace edema bilateral lower extremities  Data Reviewed: Basic Metabolic Panel:  Recent Labs Lab 05/15/13 2320 05/16/13 0603 05/16/13 1637 05/17/13 0452 05/18/13 0456  NA 140 139  --  142 140  K 4.1 5.3* 4.6 4.2 4.4  CL 95* 96  --  99 100  CO2 34* 29  --  30 33*  GLUCOSE 142* 165*  --  110* 101*  BUN 11 13  --  22 23  CREATININE 0.81 0.84  --  0.92 0.80  CALCIUM 10.0 10.1  --  9.3 9.7   Liver Function Tests:  Recent Labs Lab 05/16/13 0603  AST 23  ALT 17  ALKPHOS 79  BILITOT 0.2*  PROT 8.4*  ALBUMIN 2.8*   CBC:  Recent Labs Lab 05/15/13 2320 05/16/13 0603 05/18/13 0456  WBC 15.6* 16.2* 11.2*  NEUTROABS 11.0* 15.0*  --   HGB 10.7* 11.1* 9.5*  HCT 32.6* 33.7* 29.7*  MCV 90.3 90.3 92.2  PLT 341 350 318   Cardiac Enzymes:  Recent  Labs Lab 05/15/13 2320 05/16/13 0603 05/16/13 1000 05/16/13 1637  TROPONINI <0.30 <0.30 <0.30 <0.30   BNP (last 3 results)  Recent Labs  01/19/13 0231 04/03/13 2040 05/15/13 2320  PROBNP 1633.0* 387.1* 1744.0*   CBG:  Recent Labs Lab 05/19/13 1146 05/19/13 1654 05/19/13 2152 05/20/13 0742 05/20/13 1234  GLUCAP 113* 143* 153* 86 105*    Recent Results (from the past 240 hour(s))  CULTURE, BLOOD (ROUTINE X 2)     Status: None   Collection Time    05/16/13 12:10 AM      Result Value Range Status   Specimen Description BLOOD RIGHT ARM   Final   Special Requests BOTTLES DRAWN AEROBIC AND ANAEROBIC 10CC   Final   Culture  Setup Time     Final   Value: 05/16/2013 09:41     Performed at [facility]   Culture     Final   Value:        BLOOD CULTURE RECEIVED NO GROWTH TO DATE CULTURE WILL BE HELD FOR 5 DAYS BEFORE ISSUING A FINAL NEGATIVE REPORT     Performed at [facility]   Report Status PENDING   Incomplete  CULTURE, BLOOD (ROUTINE X 2)     Status: None   Collection Time    05/16/13 12:20 AM      Result Value Range Status   Specimen Description BLOOD LEFT ARM   Final   Special Requests BOTTLES DRAWN AEROBIC ONLY 10CC   Final   Culture  Setup Time     Final   Value: 05/16/2013 09:41     Performed at [facility]   Culture     Final   Value:        BLOOD CULTURE RECEIVED NO GROWTH TO DATE CULTURE WILL BE HELD FOR 5 DAYS BEFORE ISSUING A FINAL NEGATIVE REPORT     Performed at [facility]   Report Status PENDING   Incomplete  MRSA PCR SCREENING     Status: None   Collection Time    05/16/13  3:31 AM      Result Value Range Status   MRSA by PCR NEGATIVE  NEGATIVE Final   Comment:            The GeneXpert MRSA Assay (FDA     approved for NASAL specimens     only), is one component of a     comprehensive MRSA colonization     surveillance program. It is not     intended to diagnose MRSA     infection nor to guide or     monitor  treatment for     MRSA infections.     Studies:  Recent x-ray studies have been reviewed in detail by the Attending Physician  Scheduled Meds:  Scheduled Meds: . amLODipine  10 mg Oral Daily  . ARIPiprazole  10 mg Oral Daily  . aztreonam  1 g Intravenous Q8H  . budesonide (  PULMICORT) nebulizer solution  0.25 mg Nebulization BID  . busPIRone  10 mg Oral BID  . cholecalciferol  400 Units Oral Daily  . cloNIDine  0.2 mg Oral BID  . divalproex  250 mg Oral BID  . DULoxetine  60 mg Oral BID  . famotidine  20 mg Oral BID  . fluticasone  2 spray Each Nare Daily  . heparin subcutaneous  5,000 Units Subcutaneous Q8H  . insulin aspart  0-5 Units Subcutaneous QHS  . insulin aspart  0-9 Units Subcutaneous TID WC  . isosorbide mononitrate  30 mg Oral Daily  . predniSONE  10 mg Oral Q breakfast  . senna  2 tablet Oral Daily  . simethicone  80 mg Oral Q8H  . sodium chloride  10-40 mL Intracatheter Q12H  . vancomycin  750 mg Intravenous Q12H  . ascorbic acid  500 mg Oral BID    Time spent on care of this patient: 25 mins   [facility] T  [facility] Office  [phone] Pager - Text Page per Loretha Stapler as per below:  On-Call/Text Page:      [email or web address]      password [email or web address]  If 7PM-7AM, please contact night-coverage [email or web address] Password [email or web address] 05/20/2013, 1:37 PM   LOS: 5 days

## 2013-05-20 NOTE — Consult Note (Signed)
PULMONARY  / CRITICAL CARE MEDICINE  Name: [name] MRN: [id number] DOB: 05-15-1951    ADMISSION DATE:  05/15/2013 CONSULTATION DATE:  05/20/2013  REFERRING MD :  Sharon Seller PRIMARY SERVICE:  TRH  CHIEF COMPLAINT:  Trach dislodgement  BRIEF PATIENT DESCRIPTION: 62 yo F recently hospitalized for cute hypercarbic respiratory failure requiring intubation followed by placement of tracheostomy by Dr. Molli Knock.  During this hospitalization, pt has had several episodes of her trach being dislodged.  PCCM was consulted for further eval.  SIGNIFICANT EVENTS / STUDIES:  11/13 - admitted  LINES / TUBES: PICC 11/13 >>> Trach 10/31 >>>  CULTURES: Blood 11/13 >>> Sputum 11/16 >>>  ANTIBIOTICS: Aztreonam 11/13 >>> Vanc 11/13 >>>  HISTORY OF PRESENT ILLNESS:  62 yo NH resident with COPD, OSA, OHS (on BiPAP / oxygen) and on multiple psychoactive drugs.  Admitted 11/13 for SOB.  Pt was recently hospitalized for acute hypercarbic respiratory failure and was intubated followed by placement of tracheostomy by Dr. Molli Knock.  During this hospitalization, pt has had several episodes of her trach being dislodged.  PCCM was consulted for further eval. Per RN, pt has been pulling/playing with her trach site and of note, while pt was at nursing home, she did manage to pull her trach completely out.  RN has informed pt to not touch/manipulate the trach.  PAST MEDICAL HISTORY :  Past Medical History  Diagnosis Date  . COPD   . Hypertension   . Chronic respiratory failure     3 L oxygen  . GERD (gastroesophageal reflux disease)   . Diabetes mellitus   . Obesity, Class III, BMI 40-49.9 (morbid obesity)   . Obstructive sleep apnea on CPAP   . Peripheral vascular disease   . Bipolar disease, chronic   . Lymphedema   . COPD (chronic obstructive pulmonary disease)   . Cellulitis   . Coronary artery disease   . Asthma   . Myocardial infarction   . Vertigo    Past Surgical History  Procedure  Laterality Date  . Abdominal hysterectomy    . Cholecystectomy    . Appendectomy    . Tubal ligation    . Tracheostomy     Prior to Admission medications   Medication Sig Start Date End Date Taking? Authorizing Provider  albuterol (PROVENTIL) (2.5 MG/3ML) 0.083% nebulizer solution Take 3 mLs (2.5 mg total) by nebulization every 4 (four) hours as needed for wheezing. 05/09/13  Yes Jeanella Craze, NP  amLODipine (NORVASC) 5 MG tablet Take 5 mg by mouth daily.   Yes Historical Provider, MD  ARIPiprazole (ABILIFY) 10 MG tablet Take 10 mg by mouth daily. For depressive psychosis   Yes Historical Provider, MD  ascorbic acid (VITAMIN C) 500 MG tablet Take 500 mg by mouth 2 (two) times daily.   Yes Historical Provider, MD  budesonide-formoterol (SYMBICORT) 160-4.5 MCG/ACT inhaler Inhale 2 puffs into the lungs 2 (two) times daily.   Yes Historical Provider, MD  busPIRone (BUSPAR) 10 MG tablet Take 10 mg by mouth 2 (two) times daily.   Yes Historical Provider, MD  cloNIDine (CATAPRES) 0.2 MG tablet Take 0.2 mg by mouth 2 (two) times daily.   Yes Historical Provider, MD  divalproex (DEPAKOTE ER) 250 MG 24 hr tablet Take 250 mg by mouth 2 (two) times daily.   Yes Historical Provider, MD  DULoxetine (CYMBALTA) 60 MG capsule Take 60 mg by mouth 2 (two) times daily.   Yes Historical Provider, MD  famotidine (PEPCID) 20 MG  tablet Take 20 mg by mouth 2 (two) times daily.   Yes Historical Provider, MD  fluticasone (FLONASE) 50 MCG/ACT nasal spray Place 2 sprays into the nose daily.   Yes Historical Provider, MD  isosorbide mononitrate (IMDUR) 30 MG 24 hr tablet Take 30 mg by mouth daily.   Yes Historical Provider, MD  Multiple Vitamins-Minerals (CERTAGEN PO) Take 1 tablet by mouth daily.   Yes Historical Provider, MD  nitroGLYCERIN (NITROSTAT) 0.4 MG SL tablet Place 0.4 mg under the tongue every 5 (five) minutes as needed for chest pain.    Yes Historical Provider, MD  senna (SENOKOT) 8.6 MG TABS Take 2 tablets  by mouth daily.   Yes Historical Provider, MD  simethicone (MYLICON) 80 MG chewable tablet Chew 80 mg by mouth every 8 (eight) hours.   Yes Historical Provider, MD  tiotropium (SPIRIVA) 18 MCG inhalation capsule Place 18 mcg into inhaler and inhale daily.   Yes Historical Provider, MD  vitamin D, CHOLECALCIFEROL, 400 UNITS tablet Take 400 Units by mouth daily.   Yes Historical Provider, MD   Allergies  Allergen Reactions  . Penicillins Anaphylaxis    Tongue swelling  . Aspirin Other (See Comments)    Per MAR  . Bee Venom Other (See Comments)    Unknown  . Lisinopril     Per MAR  . Nyquil Cold & [Dm-Doxylamine-Acetaminophen] Other (See Comments)    Unknown   . Ppd [Tuberculin Purified Protein Derivative] Other (See Comments)    unknown    FAMILY HISTORY:  Family History  Problem Relation Age of Onset  . Prostate cancer     SOCIAL HISTORY:  reports that she has never smoked. She has never used smokeless tobacco. She reports that she does not drink alcohol or use illicit drugs.  REVIEW OF SYSTEMS:   Negative, see HPI.  SUBJECTIVE: Pt states that she is OK and breathing is improved from time of admission.  Denies any SOB, chest pain.  States that the ties of her trach site are uncomfortable and too tight.  Explained that unfortunately ties need to be on tightly so that trach stays in.  Pt verbalized understanding.  Requested some adjustments which I did at bedside, pt reports that trach/ties are a little more comfortable now.  VITAL SIGNS: Temp:  [98.1 F (36.7 C)-99.4 F (37.4 C)] 98.2 F (36.8 C) (11/17 1201) Pulse Rate:  [78-99] 79 (11/17 1201) Resp:  [20-34] 22 (11/17 1201) BP: (140-163)/(61-71) 140/61 mmHg (11/17 1201) SpO2:  [95 %-100 %] 98 % (11/17 1201) FiO2 (%):  [28 %] 28 % (11/17 1201) Weight:  [286 lb 9.6 oz (130 kg)] 286 lb 9.6 oz (130 kg) (11/17 0417)  PHYSICAL EXAMINATION: General:  Morbidly obese female, resting in bed, in NAD.  Neuro:  A&O x 3.  Mood and  affect appropriate. HEENT:  [place]/AT.  Trach collar in place.  Trach inspected and ties adjusted.  Dressings in place, C/D/I. Neck:  Trach collar in place. Cardiovascular:  RRR, no M/R/G appreciated. Lungs:  Resp's even and unlabored.  CTA bilaterally, no W/R/R. Abdomen:  BS x 4. Soft, NT/ND. Musculoskeletal:  No edema. Skin:  Warm and dry.   Recent Labs Lab 05/16/13 0603 05/16/13 1637 05/17/13 0452 05/18/13 0456  NA 139  --  142 140  K 5.3* 4.6 4.2 4.4  CL 96  --  99 100  CO2 29  --  30 33*  BUN 13  --  22 23  CREATININE 0.84  --  0.92 0.80  GLUCOSE 165*  --  110* 101*    Recent Labs Lab 05/15/13 2320 05/16/13 0603 05/18/13 0456  HGB 10.7* 11.1* 9.5*  HCT 32.6* 33.7* 29.7*  WBC 15.6* 16.2* 11.2*  PLT 341 350 318   Dg Chest Port 1 View  05/19/2013   CLINICAL DATA:  Bronchitis versus pneumonia  EXAM: PORTABLE CHEST - 1 VIEW  COMPARISON:  05/15/2013, 05/05/2013, 05/03/2013, 04/05/2013.  FINDINGS: There is a tracheostomy tube in unchanged, satisfactory position. There is a right-sided PICC line with the tip projecting over the cavoatrial junction in satisfactory position.  The heart and mediastinum are stable. There is elevation of the right diaphragm. There are low lung volumes. There is prominence of these interstitial markings bilaterally likely secondary to low lung volumes. There is no focal consolidation, pleural effusion or pneumothorax.  There are mild degenerative changes of the right glenohumeral joint and bilateral acromioclavicular joints.  IMPRESSION: No focal consolidation to suggest lobar pneumonia. Hypoexpanded lungs accounting for prominence of bilateral interstitial prominence. No significant interval change compared with 05/15/2013.   Electronically Signed   By: Elige Ko   On: 05/19/2013 08:21    ASSESSMENT / PLAN:  Tracheostomy Dislodgement - Suspect that this could be a multifactorial issue especially since the pt has been manipulating/playing with her  trach. Plan: - Would not consider decanulation given pt's body habitus and limited mobility. - Ensure that trach is adequately secured (discussed with staff regarding being mindful of excess adipose tissue around pt's neck). - If continues to dislodge, would recommend #4 proximal XLT trach. - Please call if needed.  Acute respiratory failure: likely tracheobronchitis due to COPD; does not appear to have pneumonia Plan: -agree with steroids as you are doing -add brovana for bid dosing  -continue pulmicort -wean antibiotics as she improves  All other medical problems managed per primary team.  Rutherford Guys, PA - S  Pulmonary and Critical Care Medicine [facility] Pager: [phone]  05/20/2013, 2:38 PM  Attending:  I have seen and examined the patient with nurse practitioner/resident and agree with and have edited the note above.   AE COPD > agree with treatment, added brovana which she should use at home Trach> seems that it dislodged due to her manipulating the trach and the fact that the ties were not secure.  Not sure an XLT will help at this point.  We adjusted the ties.  If she pulls out again we can reconsider an XLT.  Needs tracheostomy clinic follow up, we will arrange.  PCCM will sign off, please don't hesitate to call back if needed.  Yolonda Kida PCCM Pager: [phone] Cell: [phone] If no response, call [phone]

## 2013-05-20 NOTE — Progress Notes (Signed)
RT called to beside for trach dislodged. RT placed trach, and confirmed placement with BBS and C02 detector. -Good color change. No breakdown noted around trach site. Trach secured w/ ties.

## 2013-05-21 LAB — GLUCOSE, CAPILLARY

## 2013-05-21 MED ORDER — ARFORMOTEROL TARTRATE 15 MCG/2ML IN NEBU: 15.0000 ug | INHALATION_SOLUTION | Freq: Two times a day (BID) | RESPIRATORY_TRACT | Status: DC

## 2013-05-21 NOTE — Clinical Social Work Note (Signed)
Per MD patient ready for DC back to [facility]. RN, patient, and facility notified of DC. RN given number for report. Ambulance requested for patient transport for 2:45PM. DC packet left with patient's chart. CSW signing off.  Roddie Mc, [place], [place], [phone]

## 2013-05-21 NOTE — Discharge Summary (Signed)
Physician Discharge Summary  [name]:[id number] DOB: 07/02/1951 DOA: 05/15/2013  PCP: Karlene Einstein, MD  Admit date: 05/15/2013 Discharge date: 05/21/2013  Time spent: >45 minutes  Recommendations for Outpatient Follow-up:  1. Ensure she folllows up with [facility]  Discharge Diagnoses:  Principal Problem:   Respiratory failure, acute and chronic Active Problems:   Morbid obesity   BIPOLAR DISORDER UNSPECIFIED   OBSTRUCTIVE SLEEP APNEA   Acute exacerbation of chronic obstructive pulmonary disease (COPD)   Tracheostomy in place   Chest pain   Discharge Condition: stable  Diet recommendation: heart healthy  Filed Weights   05/19/13 0406 05/20/13 0417 05/21/13 0250  Weight: 129.6 kg (285 lb 11.5 oz) 130 kg (286 lb 9.6 oz) 127.7 kg (281 lb 8.4 oz)    History of present illness:  62 y.o. female who was admitted October 2014 for acute hypercarbic respiratory failure and was intubated and subsequently had tracheostomy placed was brought from the nursing home after patient was complaining of shortness of breath. In the ER the patient's trach was changed by respiratory with some difficulty. Patient stated she'd been short of breath for last 3 days with pleuritic type of chest pain, productive cough, fever, and chills. In the ER patient was found to be wheezing and was given nebulizer, steroids, and antibiotics. Chest x-ray revealed improved infiltrates compared to the prior admission.   Hospital Course:  Respiratory failure, acute on chronic - 3L O2 dependent at baseline  - had an episode 11/14 worrisome for trach malposition and heavy secretions - PCCM repositioned trach and suctioned w/ marked improvement  - influenza negative   Acute exacerbation of chronic obstructive pulmonary disease (COPD)  - cont steroids - improved w/ no noted wheezing - rapid steroid taper will end today - Brovana started by pulm team  Hyperkalemia  - resolved    Hyperglycemia - steroid induced  - A1c not c/w true DM - follow w/ temporary SSI prn   Tracheostomy  Placed October 2014 by PCCM Molli Knock) - to f/u in [facility]   Morbid obesity - Body mass index is 54.01 kg/(m^2).   Bipolar D/O   OHS / OBSTRUCTIVE SLEEP APNEA    Consultations:  Pulmonary  Discharge Exam: Filed Vitals:   05/21/13 1129  BP: 142/69  Pulse:   Temp:   Resp:     General: Awake and alert Cardiovascular: RRR, no murmurs Respiratory: CTA b/l Trach collar in place  Discharge Instructions      Discharge Orders   Future Orders Complete By Expires   Diet - low sodium heart healthy  As directed    Increase activity slowly  As directed        Medication List         albuterol (2.5 MG/3ML) 0.083% nebulizer solution  Commonly known as:  PROVENTIL  Take 3 mLs (2.5 mg total) by nebulization every 4 (four) hours as needed for wheezing.     amLODipine 5 MG tablet  Commonly known as:  NORVASC  Take 5 mg by mouth daily.     arformoterol 15 MCG/2ML Nebu  Commonly known as:  BROVANA  Take 2 mLs (15 mcg total) by nebulization 2 (two) times daily.     ARIPiprazole 10 MG tablet  Commonly known as:  ABILIFY  Take 10 mg by mouth daily. For depressive psychosis     ascorbic acid 500 MG tablet  Commonly known as:  VITAMIN C  Take 500 mg by mouth 2 (two) times  daily.     budesonide-formoterol 160-4.5 MCG/ACT inhaler  Commonly known as:  SYMBICORT  Inhale 2 puffs into the lungs 2 (two) times daily.     busPIRone 10 MG tablet  Commonly known as:  BUSPAR  Take 10 mg by mouth 2 (two) times daily.     CERTAGEN PO  Take 1 tablet by mouth daily.     cloNIDine 0.2 MG tablet  Commonly known as:  CATAPRES  Take 0.2 mg by mouth 2 (two) times daily.     divalproex 250 MG 24 hr tablet  Commonly known as:  DEPAKOTE ER  Take 250 mg by mouth 2 (two) times daily.     DULoxetine 60 MG capsule  Commonly known as:  CYMBALTA  Take 60 mg by mouth 2 (two)  times daily.     famotidine 20 MG tablet  Commonly known as:  PEPCID  Take 20 mg by mouth 2 (two) times daily.     fluticasone 50 MCG/ACT nasal spray  Commonly known as:  FLONASE  Place 2 sprays into the nose daily.     isosorbide mononitrate 30 MG 24 hr tablet  Commonly known as:  IMDUR  Take 30 mg by mouth daily.     nitroGLYCERIN 0.4 MG SL tablet  Commonly known as:  NITROSTAT  Place 0.4 mg under the tongue every 5 (five) minutes as needed for chest pain.     senna 8.6 MG Tabs tablet  Commonly known as:  SENOKOT  Take 2 tablets by mouth daily.     simethicone 80 MG chewable tablet  Commonly known as:  MYLICON  Chew 80 mg by mouth every 8 (eight) hours.     tiotropium 18 MCG inhalation capsule  Commonly known as:  SPIRIVA  Place 18 mcg into inhaler and inhale daily.     vitamin D (CHOLECALCIFEROL) 400 UNITS tablet  Take 400 Units by mouth daily.       Allergies  Allergen Reactions  . Penicillins Anaphylaxis    Tongue swelling  . Aspirin Other (See Comments)    Per MAR  . Bee Venom Other (See Comments)    Unknown  . Lisinopril     Per MAR  . Nyquil Cold & [Dm-Doxylamine-Acetaminophen] Other (See Comments)    Unknown   . Ppd [Tuberculin Purified Protein Derivative] Other (See Comments)    unknown      The results of significant diagnostics from this hospitalization (including imaging, microbiology, ancillary and laboratory) are listed below for reference.    Significant Diagnostic Studies: Dg Chest Port 1 View  05/19/2013   CLINICAL DATA:  Bronchitis versus pneumonia  EXAM: PORTABLE CHEST - 1 VIEW  COMPARISON:  05/15/2013, 05/05/2013, 05/03/2013, 04/05/2013.  FINDINGS: There is a tracheostomy tube in unchanged, satisfactory position. There is a right-sided PICC line with the tip projecting over the cavoatrial junction in satisfactory position.  The heart and mediastinum are stable. There is elevation of the right diaphragm. There are low lung volumes. There  is prominence of these interstitial markings bilaterally likely secondary to low lung volumes. There is no focal consolidation, pleural effusion or pneumothorax.  There are mild degenerative changes of the right glenohumeral joint and bilateral acromioclavicular joints.  IMPRESSION: No focal consolidation to suggest lobar pneumonia. Hypoexpanded lungs accounting for prominence of bilateral interstitial prominence. No significant interval change compared with 05/15/2013.   Electronically Signed   By: Elige Ko   On: 05/19/2013 08:21   Dg Chest Port 1 [address]  05/16/2013   CLINICAL DATA:  Shortness of breath.  EXAM: PORTABLE CHEST - 1 VIEW  COMPARISON:  Chest radiograph performed 05/05/2013  FINDINGS: The patient's tracheostomy tube is seen ending 6-7 cm above the carina.  The lungs remain hypoexpanded. Vascular crowding is noted. Mild bibasilar atelectasis is seen; previously noted airspace opacities have resolved. No definite pleural effusion or pneumothorax is identified.  The cardiomediastinal silhouette is mildly enlarged. No acute osseous abnormalities are seen.  IMPRESSION: Lungs remain hypoexpanded; mild bibasilar atelectasis noted. Previously noted bilateral airspace opacities have resolved. Mild cardiomegaly noted.   Electronically Signed   By: Roanna Raider M.D.   On: 05/16/2013 00:32   Dg Chest Port 1 View  05/05/2013   CLINICAL DATA:  Tracheostomy placement.  EXAM: PORTABLE CHEST - 1 VIEW  COMPARISON:  05/03/2013  FINDINGS: Endotracheal tube has been removed and the patient now has a tracheostomy tube which appears to be appropriately positioned. Again noted is a left jugular central venous catheter. The central line tip is curved and may be extending into the azygos vein. This catheter tip position is similar to the prior examination. Nasogastric tube extends into the abdomen. Again noted are low lung volumes with elevation of the right hemidiaphragm. Difficult to exclude mild edema. Heart size  is grossly stable. Negative for a pneumothorax.  IMPRESSION: Placement of tracheostomy tube.  Central line location as described. The tip is probably extending into the azygos vein.  Low lung volumes and cannot exclude mild edema.   Electronically Signed   By: Richarda Overlie M.D.   On: 05/05/2013 07:56   Dg Chest Port 1 View  05/03/2013   CLINICAL DATA:  Endotracheal tube position and  EXAM: PORTABLE CHEST - 1 VIEW  COMPARISON:  Prior chest x-ray 05/02/2013  FINDINGS: The endotracheal tube is 1.5 cm above the carinal. A left subclavian central venous catheter is present. The tip curves and is likely within the azygos vein. A the tip of the nasogastric tube has pulled back and is now at the GE junction. Inspiratory volumes are very low. There has been some interval clearing of bibasilar atelectasis. Pulmonary vascular congestion persists.  IMPRESSION: 1. The nasogastric tube has pulled back. The tip is at the GE junction. If the intent is to decompress the stomach, recommend advancing 10 cm. 2. The tip of the left subclavian central venous catheter is likely within the azygos vein. 3. The endotracheal tube is 1.5 cm above the carinal. 4. Persistent very low inspiratory volumes with slightly improved bibasilar atelectasis.   Electronically Signed   By: Malachy Moan M.D.   On: 05/03/2013 07:32   Dg Chest Port 1 View  05/02/2013   CLINICAL DATA:  Ventilator dependent respiratory failure. Acute kidney injury.  EXAM: PORTABLE CHEST - 1 VIEW  COMPARISON:  05/01/2013  FINDINGS: Endotracheal tube and nasogastric tube remain in appropriate position. The left-sided central venous catheter is again seen with the tip curved into the azygos vein.  Low lung volumes and bibasilar atelectasis or infiltrates show no significant change. Cardiomegaly is stable.  IMPRESSION: Left-sided central venous catheter tip is again seen curving into the azygos vein.  No significant change in low lung volumes and bibasilar atelectasis or  infiltrates.   Electronically Signed   By: Myles Rosenthal M.D.   On: 05/02/2013 07:56   Dg Chest Port 1 View  05/01/2013   CLINICAL DATA:  Shortness of breath. Intubated.  EXAM: PORTABLE CHEST - 1 VIEW  COMPARISON:  04/30/2013.  FINDINGS: The endotracheal tube is 2.5 cm above the carinal. The left sided central venous catheter tip has flipped into the azygos vein and should be repositioned. The nasogastric tube tip is in the stomach.  Very low lung volumes with vascular crowding and atelectasis. Persistent elevation of the right hemidiaphragm without definite pleural effusion.  IMPRESSION: The left-sided central venous catheter tip is now in the region of the azygos vein and should be repositioned.  The endotracheal tube and NG tubes are stable.  Worsening lung aeration in part due to lower lung volumes with worsening vascular congestion and atelectasis.   Electronically Signed   By: Loralie Champagne M.D.   On: 05/01/2013 07:23   Dg Chest Portable 1 View  04/30/2013   CLINICAL DATA:  hypoxia  EXAM: PORTABLE CHEST - 1 VIEW  COMPARISON:  None.  FINDINGS: Endotracheal tube tip is 2.9 cm above the carinal. Central catheter tip is in the right atrium. No pneumothorax.  There is no edema or consolidation. Heart is mildly enlarged with normal pulmonary vascularity. No adenopathy. There is eventration of the right hemidiaphragm.  IMPRESSION: Tube and catheter positions as described. No pneumothorax. No edema or consolidation.   Electronically Signed   By: Bretta Bang M.D.   On: 04/30/2013 21:20   Dg Chest Port 1 View  04/30/2013   CLINICAL DATA:  Shortness of breath and altered mental status  EXAM: PORTABLE CHEST - 1 VIEW  COMPARISON:  April 06, 2013  FINDINGS: There is no edema or consolidation. Heart is mildly enlarged with normal pulmonary vascularity. No adenopathy. No bone lesions.  IMPRESSION: No edema or consolidation.   Electronically Signed   By: Bretta Bang M.D.   On: 04/30/2013 16:34   Dg  Abd Portable 1v  05/04/2013   CLINICAL DATA:  Nasogastric tube placement  EXAM: PORTABLE ABDOMEN - 1 VIEW  COMPARISON:  Portable exam 2312 hr compared to 05/04/2013 at 1315 hr  FINDINGS: Nasogastric tube looped in proximal stomach with tip projecting over antrum.  EKG leads project over abdomen.  Nonobstructive bowel gas pattern with slight gaseous distention of the transverse colon noted.  Atelectasis versus consolidation at lung bases.  IMPRESSION: Nasogastric tube is looped at the proximal stomach with the tip projecting over the gastric antrum.   Electronically Signed   By: Ulyses Southward M.D.   On: 05/04/2013 23:28   Dg Abd Portable 1v  05/04/2013   CLINICAL DATA:  Nasogastric tube placement  EXAM: PORTABLE ABDOMEN - 1 VIEW  COMPARISON:  07/03/2007  FINDINGS: Abdomen incompletely included in the field of view. Nasogastric tube tip terminates over the distal stomach/ duodenum bulb. No visualized loops of dilated bowel. Presence or absence of air-fluid levels or free air cannot be assessed on this projection.  IMPRESSION: Nasogastric tube tip terminates over the distal stomach/ duodenum bulb.   Electronically Signed   By: Christiana Pellant M.D.   On: 05/04/2013 13:46    Microbiology: Recent Results (from the past 240 hour(s))  CULTURE, BLOOD (ROUTINE X 2)     Status: None   Collection Time    05/16/13 12:10 AM      Result Value Range Status   Specimen Description BLOOD RIGHT ARM   Final   Special Requests BOTTLES DRAWN AEROBIC AND ANAEROBIC 10CC   Final   Culture  Setup Time     Final   Value: 05/16/2013 09:41     Performed at [facility]  Final   Value:        BLOOD CULTURE RECEIVED NO GROWTH TO DATE CULTURE WILL BE HELD FOR 5 DAYS BEFORE ISSUING A FINAL NEGATIVE REPORT     Performed at [facility]   Report Status PENDING   Incomplete  CULTURE, BLOOD (ROUTINE X 2)     Status: None   Collection Time    05/16/13 12:20 AM      Result Value Range Status   Specimen  Description BLOOD LEFT ARM   Final   Special Requests BOTTLES DRAWN AEROBIC ONLY 10CC   Final   Culture  Setup Time     Final   Value: 05/16/2013 09:41     Performed at [facility]   Culture     Final   Value:        BLOOD CULTURE RECEIVED NO GROWTH TO DATE CULTURE WILL BE HELD FOR 5 DAYS BEFORE ISSUING A FINAL NEGATIVE REPORT     Performed at [facility]   Report Status PENDING   Incomplete  MRSA PCR SCREENING     Status: None   Collection Time    05/16/13  3:31 AM      Result Value Range Status   MRSA by PCR NEGATIVE  NEGATIVE Final   Comment:            The GeneXpert MRSA Assay (FDA     approved for NASAL specimens     only), is one component of a     comprehensive MRSA colonization     surveillance program. It is not     intended to diagnose MRSA     infection nor to guide or     monitor treatment for     MRSA infections.  CULTURE, RESPIRATORY (NON-EXPECTORATED)     Status: None   Collection Time    05/19/13  6:16 PM      Result Value Range Status   Specimen Description TRACHEAL ASPIRATE   Final   Special Requests NONE   Final   Gram Stain     Final   Value: MODERATE WBC PRESENT,BOTH PMN AND MONONUCLEAR     NO SQUAMOUS EPITHELIAL CELLS SEEN     NO ORGANISMS SEEN     Performed at [facility]   Culture     Final   Value: MODERATE STAPHYLOCOCCUS AUREUS     Note: RIFAMPIN AND GENTAMICIN SHOULD NOT BE USED AS SINGLE DRUGS FOR TREATMENT OF STAPH INFECTIONS.     Performed at [facility]   Report Status PENDING   Incomplete     Labs: Basic Metabolic Panel:  Recent Labs Lab 05/15/13 2320 05/16/13 0603 05/16/13 1637 05/17/13 0452 05/18/13 0456  NA 140 139  --  142 140  K 4.1 5.3* 4.6 4.2 4.4  CL 95* 96  --  99 100  CO2 34* 29  --  30 33*  GLUCOSE 142* 165*  --  110* 101*  BUN 11 13  --  22 23  CREATININE 0.81 0.84  --  0.92 0.80  CALCIUM 10.0 10.1  --  9.3 9.7   Liver Function Tests:  Recent Labs Lab 05/16/13 0603  AST 23   ALT 17  ALKPHOS 79  BILITOT 0.2*  PROT 8.4*  ALBUMIN 2.8*   No results found for this basename: LIPASE, AMYLASE,  in the last 168 hours No results found for this basename: AMMONIA,  in the last 168 hours CBC:  Recent Labs  Lab 05/15/13 2320 05/16/13 0603 05/18/13 0456  WBC 15.6* 16.2* 11.2*  NEUTROABS 11.0* 15.0*  --   HGB 10.7* 11.1* 9.5*  HCT 32.6* 33.7* 29.7*  MCV 90.3 90.3 92.2  PLT 341 350 318   Cardiac Enzymes:  Recent Labs Lab 05/15/13 2320 05/16/13 0603 05/16/13 1000 05/16/13 1637  TROPONINI <0.30 <0.30 <0.30 <0.30   BNP: BNP (last 3 results)  Recent Labs  01/19/13 0231 04/03/13 2040 05/15/13 2320  PROBNP 1633.0* 387.1* 1744.0*   CBG:  Recent Labs Lab 05/20/13 1234 05/20/13 1616 05/20/13 2126 05/21/13 0729 05/21/13 1214  GLUCAP 105* 136* 137* 101* 124*       Signed:  Lennon Richins  [facility] 05/21/2013, 12:47 PM

## 2013-05-21 NOTE — Clinical Social Work Note (Signed)
CSW notified [facility] admission coordinator of patient's planned DC for 05/21/16. CSW will continue to assist with DC needs.  Roddie Mc, [place], [place], [phone]

## 2013-05-21 NOTE — Progress Notes (Signed)
Report called to [facility], given to nurse, [facility], all questions answered. Patient without complaints.

## 2013-05-21 NOTE — Progress Notes (Signed)
Discharged to [facility] via [place].  No complaints.

## 2013-05-22 LAB — CULTURE, RESPIRATORY W GRAM STAIN

## 2013-05-22 LAB — CULTURE, BLOOD (ROUTINE X 2)

## 2013-05-22 LAB — GLUCOSE, CAPILLARY: Glucose-Capillary: 115 mg/dL — ABNORMAL HIGH (ref 70–99)

## 2013-05-24 DIAGNOSIS — R0600 Dyspnea, unspecified: Secondary | ICD-10-CM | POA: Insufficient documentation

## 2013-05-24 NOTE — Progress Notes (Signed)
Patient ID: [name], female   DOB: 28-Apr-1951, 62 y.o.   MRN: [id number]        PROGRESS NOTE  DATE: 04/30/2013  FACILITY:  [facility]  LEVEL OF CARE: SNF (31)  Acute Visit  CHIEF COMPLAINT:  Manage shortness of breath.    HISTORY OF PRESENT ILLNESS: I was requested by the staff to assess the patient regarding above problem(s):  Staff report that patient is acutely short of breath.  She is also having a new cough and congestion.  Patient is lethargic and overall a poor historian.  Staff do not report respiratory insufficiency.  They cannot identify precipitating or alleviating factors.    PAST MEDICAL HISTORY : Reviewed.  No changes.  CURRENT MEDICATIONS: Reviewed per [facility]  REVIEW OF SYSTEMS:  Unobtainable.  Patient is lethargic.    PHYSICAL EXAMINATION  VS:  T 101.5       P 60      RR 26      BP 148/78     POX %       WT (Lb)  GENERAL: no acute distress, morbidly obese body habitus EYES: conjunctivae normal, sclerae normal, normal eye lids NECK: supple, trachea midline, no neck masses, no thyroid tenderness, no thyromegaly LYMPHATICS: no LAN in the neck, no supraclavicular LAN RESPIRATORY: decreased breath sounds, but no crackles or wheezes   CARDIAC: RRR, no murmur,no extra heart sounds  EDEMA/VARICOSITIES:  +1 bilateral lower extremity edema   ARTERIAL: left lower extremity wrapped   GI: abdomen soft, normal BS, no masses, no tenderness, no hepatomegaly, no splenomegaly PSYCHIATRIC: the patient is alert & oriented to person, decreased affect and mood    ASSESSMENT/PLAN:  Dyspnea, cough, and fever.   New onset.  Significant problem.  Obtain stat chest x-ray.  Give Albuterol/Atrovent nebulizer treatments.  Start Avelox 400 mg q.d. x7 days and probiotics b.i.d. x10 days.         CPT CODE: 82956

## 2013-05-28 ENCOUNTER — Non-Acute Institutional Stay (SKILLED_NURSING_FACILITY): Payer: Medicaid Other | Admitting: Internal Medicine

## 2013-05-28 DIAGNOSIS — I10 Essential (primary) hypertension: Secondary | ICD-10-CM

## 2013-05-28 DIAGNOSIS — J449 Chronic obstructive pulmonary disease, unspecified: Secondary | ICD-10-CM

## 2013-05-28 DIAGNOSIS — J9610 Chronic respiratory failure, unspecified whether with hypoxia or hypercapnia: Secondary | ICD-10-CM

## 2013-05-28 DIAGNOSIS — F411 Generalized anxiety disorder: Secondary | ICD-10-CM

## 2013-05-29 ENCOUNTER — Other Ambulatory Visit: Payer: Self-pay

## 2013-05-29 MED ORDER — LORAZEPAM 0.5 MG PO TABS: ORAL_TABLET | ORAL | Status: DC

## 2013-06-01 ENCOUNTER — Encounter (HOSPITAL_COMMUNITY): Payer: Self-pay | Admitting: Emergency Medicine

## 2013-06-01 ENCOUNTER — Inpatient Hospital Stay (HOSPITAL_COMMUNITY)
Admission: EM | Admit: 2013-06-01 | Discharge: 2013-06-03 | DRG: 208 | Disposition: A | Discharge status: Skilled Nursing Facility | Payer: Medicaid Other | Attending: Pulmonary Disease | Admitting: Pulmonary Disease

## 2013-06-01 ENCOUNTER — Inpatient Hospital Stay (HOSPITAL_COMMUNITY): Payer: Medicaid Other

## 2013-06-01 DIAGNOSIS — Z430 Encounter for attention to tracheostomy: Principal | ICD-10-CM

## 2013-06-01 DIAGNOSIS — F319 Bipolar disorder, unspecified: Secondary | ICD-10-CM | POA: Diagnosis present

## 2013-06-01 DIAGNOSIS — J449 Chronic obstructive pulmonary disease, unspecified: Secondary | ICD-10-CM | POA: Diagnosis present

## 2013-06-01 DIAGNOSIS — Z6841 Body Mass Index (BMI) 40.0 and over, adult: Secondary | ICD-10-CM

## 2013-06-01 DIAGNOSIS — I739 Peripheral vascular disease, unspecified: Secondary | ICD-10-CM | POA: Diagnosis present

## 2013-06-01 DIAGNOSIS — I10 Essential (primary) hypertension: Secondary | ICD-10-CM | POA: Diagnosis present

## 2013-06-01 DIAGNOSIS — J9600 Acute respiratory failure, unspecified whether with hypoxia or hypercapnia: Secondary | ICD-10-CM | POA: Diagnosis present

## 2013-06-01 DIAGNOSIS — J441 Chronic obstructive pulmonary disease with (acute) exacerbation: Secondary | ICD-10-CM

## 2013-06-01 DIAGNOSIS — J4489 Other specified chronic obstructive pulmonary disease: Secondary | ICD-10-CM | POA: Diagnosis present

## 2013-06-01 DIAGNOSIS — I252 Old myocardial infarction: Secondary | ICD-10-CM

## 2013-06-01 DIAGNOSIS — E1149 Type 2 diabetes mellitus with other diabetic neurological complication: Secondary | ICD-10-CM

## 2013-06-01 DIAGNOSIS — I5032 Chronic diastolic (congestive) heart failure: Secondary | ICD-10-CM | POA: Diagnosis present

## 2013-06-01 DIAGNOSIS — G4733 Obstructive sleep apnea (adult) (pediatric): Secondary | ICD-10-CM | POA: Diagnosis present

## 2013-06-01 DIAGNOSIS — E119 Type 2 diabetes mellitus without complications: Secondary | ICD-10-CM | POA: Diagnosis present

## 2013-06-01 DIAGNOSIS — J9620 Acute and chronic respiratory failure, unspecified whether with hypoxia or hypercapnia: Secondary | ICD-10-CM

## 2013-06-01 DIAGNOSIS — N179 Acute kidney failure, unspecified: Secondary | ICD-10-CM

## 2013-06-01 DIAGNOSIS — I5031 Acute diastolic (congestive) heart failure: Secondary | ICD-10-CM

## 2013-06-01 DIAGNOSIS — Z930 Tracheostomy status: Secondary | ICD-10-CM

## 2013-06-01 DIAGNOSIS — I2510 Atherosclerotic heart disease of native coronary artery without angina pectoris: Secondary | ICD-10-CM | POA: Diagnosis present

## 2013-06-01 DIAGNOSIS — K219 Gastro-esophageal reflux disease without esophagitis: Secondary | ICD-10-CM | POA: Diagnosis present

## 2013-06-01 LAB — CBC WITH DIFFERENTIAL/PLATELET
Basophils Absolute: 0 10*3/uL (ref 0.0–0.1)
Eosinophils Relative: 7 % — ABNORMAL HIGH (ref 0–5)
HCT: 31 % — ABNORMAL LOW (ref 36.0–46.0)
Hemoglobin: 10.3 g/dL — ABNORMAL LOW (ref 12.0–15.0)
Lymphocytes Relative: 26 % (ref 12–46)
Monocytes Absolute: 0.4 10*3/uL (ref 0.1–1.0)
Monocytes Relative: 5 % (ref 3–12)
Neutro Abs: 5.4 10*3/uL (ref 1.7–7.7)
Neutrophils Relative %: 62 % (ref 43–77)
RBC: 3.47 MIL/uL — ABNORMAL LOW (ref 3.87–5.11)
RDW: 14.3 % (ref 11.5–15.5)
WBC: 8.6 10*3/uL (ref 4.0–10.5)

## 2013-06-01 LAB — COMPREHENSIVE METABOLIC PANEL
ALT: 14 U/L (ref 0–35)
AST: 17 U/L (ref 0–37)
Albumin: 3.1 g/dL — ABNORMAL LOW (ref 3.5–5.2)
Alkaline Phosphatase: 69 U/L (ref 39–117)
BUN: 11 mg/dL (ref 6–23)
CO2: 40 mEq/L (ref 19–32)
Chloride: 97 mEq/L (ref 96–112)
Creatinine, Ser: 0.73 mg/dL (ref 0.50–1.10)
GFR calc non Af Amer: 89 mL/min — ABNORMAL LOW (ref >90)
Potassium: 4.4 mEq/L (ref 3.5–5.1)
Total Bilirubin: 0.2 mg/dL — ABNORMAL LOW (ref 0.3–1.2)

## 2013-06-01 LAB — URINALYSIS, ROUTINE W REFLEX MICROSCOPIC
Glucose, UA: NEGATIVE mg/dL
Hgb urine dipstick: NEGATIVE
Ketones, ur: NEGATIVE mg/dL
Leukocytes, UA: NEGATIVE
Specific Gravity, Urine: 1.017 (ref 1.005–1.030)
pH: 5 (ref 5.0–8.0)

## 2013-06-01 LAB — PROTIME-INR
INR: 0.95 (ref 0.00–1.49)
Prothrombin Time: 12.5 seconds (ref 11.6–15.2)

## 2013-06-01 LAB — GLUCOSE, CAPILLARY: Glucose-Capillary: 84 mg/dL (ref 70–99)

## 2013-06-01 LAB — MRSA PCR SCREENING: MRSA by PCR: NEGATIVE

## 2013-06-01 MED ORDER — CLONIDINE HCL 0.2 MG/24HR TD PTWK
0.2000 mg | MEDICATED_PATCH | TRANSDERMAL | Status: DC
  Administered 2013-06-01: 0.2 mg via TRANSDERMAL
  Filled 2013-06-01: qty 1

## 2013-06-01 MED ORDER — HEPARIN SODIUM (PORCINE) 5000 UNIT/ML IJ SOLN
5000.0000 [IU] | Freq: Three times a day (TID) | INTRAMUSCULAR | Status: DC
  Administered 2013-06-01 – 2013-06-03 (×6): 5000 [IU] via SUBCUTANEOUS
  Filled 2013-06-01 (×8): qty 1

## 2013-06-01 MED ORDER — FENTANYL CITRATE 0.05 MG/ML IJ SOLN
INTRAMUSCULAR | Status: AC
  Administered 2013-06-01: 300 ug
  Filled 2013-06-01: qty 4

## 2013-06-01 MED ORDER — INSULIN ASPART 100 UNIT/ML ~~LOC~~ SOLN
2.0000 [IU] | SUBCUTANEOUS | Status: DC
Start: 2013-06-01 — End: 2013-06-03

## 2013-06-01 MED ORDER — ROCURONIUM BROMIDE 50 MG/5ML IV SOLN
1.0000 mg/kg | Freq: Once | INTRAVENOUS | Status: AC
  Administered 2013-06-01: 10 mg via INTRAVENOUS

## 2013-06-01 MED ORDER — MIDAZOLAM HCL 2 MG/2ML IJ SOLN
INTRAMUSCULAR | Status: AC
  Administered 2013-06-01: 6 mg
  Filled 2013-06-01: qty 4

## 2013-06-01 MED ORDER — AMLODIPINE 1 MG/ML ORAL SUSPENSION
10.0000 mg | Freq: Every day | ORAL | Status: DC
  Filled 2013-06-01: qty 10

## 2013-06-01 MED ORDER — FAMOTIDINE IN NACL 20-0.9 MG/50ML-% IV SOLN
20.0000 mg | Freq: Two times a day (BID) | INTRAVENOUS | Status: DC
  Administered 2013-06-01 – 2013-06-03 (×4): 20 mg via INTRAVENOUS
  Filled 2013-06-01 (×5): qty 50

## 2013-06-01 MED ORDER — AMLODIPINE BESYLATE 10 MG PO TABS
10.0000 mg | ORAL_TABLET | Freq: Every day | ORAL | Status: DC
  Filled 2013-06-01 (×3): qty 1

## 2013-06-01 MED ORDER — ETOMIDATE 2 MG/ML IV SOLN
0.3000 mg/kg | Freq: Once | INTRAVENOUS | Status: AC
  Administered 2013-06-01: 20 mg via INTRAVENOUS

## 2013-06-01 MED ORDER — FENTANYL CITRATE 0.05 MG/ML IJ SOLN
25.0000 ug | INTRAMUSCULAR | Status: DC | PRN
  Administered 2013-06-01 – 2013-06-02 (×4): 50 ug via INTRAVENOUS
  Filled 2013-06-01 (×4): qty 2

## 2013-06-01 NOTE — ED Provider Notes (Signed)
CSN: [id number]     Arrival date & time 06/01/13  1157 History   First MD Initiated Contact with Patient 06/01/13 1209     Chief Complaint  Patient presents with  . Illegal value: [    trach tube removed   (Consider location/radiation/quality/duration/timing/severity/associated sxs/prior Treatment) HPI  62 y.o. y/o female presenting after trach dislodged. Pt with recent admission after presenting to ER on 10/28 with acute hypercarbic respiratory failure. Patient ultimately required tracheostomy placement in the setting of known difficult airway, morbid obesity, & OSA/OHS. It was recommended that she not be decannulated due to above mentioned pulmonary issues. Pt currently with no acute complaints. Denies SOB. Reports chronically on 02, but not sure how much.   Past Medical History  Diagnosis Date  . COPD   . Hypertension   . Chronic respiratory failure     3 L oxygen  . GERD (gastroesophageal reflux disease)   . Diabetes mellitus   . Obesity, Class III, BMI 40-49.9 (morbid obesity)   . Obstructive sleep apnea on CPAP   . Peripheral vascular disease   . Bipolar disease, chronic   . Lymphedema   . COPD (chronic obstructive pulmonary disease)   . Cellulitis   . Coronary artery disease   . Asthma   . Myocardial infarction   . Vertigo    Past Surgical History  Procedure Laterality Date  . Abdominal hysterectomy    . Cholecystectomy    . Appendectomy    . Tubal ligation    . Tracheostomy     Family History  Problem Relation Age of Onset  . Prostate cancer     History  Substance Use Topics  . Smoking status: Never Smoker   . Smokeless tobacco: Never Used  . Alcohol Use: No   OB History   Grav Para Term Preterm Abortions TAB SAB Ect Mult Living                 Review of Systems  All systems reviewed and negative, other than as noted in HPI.   Allergies  Penicillins; Aspirin; Bee venom; Lisinopril; Nyquil cold &; and Ppd  Home Medications   Current Outpatient  Rx  Name  Route  Sig  Dispense  Refill  . albuterol (PROVENTIL) (2.5 MG/3ML) 0.083% nebulizer solution   Nebulization   Take 3 mLs (2.5 mg total) by nebulization every 4 (four) hours as needed for wheezing.   75 mL   12   . amLODipine (NORVASC) 5 MG tablet   Oral   Take 5 mg by mouth daily.         Marland Kitchen arformoterol (BROVANA) 15 MCG/2ML NEBU   Nebulization   Take 2 mLs (15 mcg total) by nebulization 2 (two) times daily.   120 mL   6   . ARIPiprazole (ABILIFY) 10 MG tablet   Oral   Take 10 mg by mouth daily. For depressive psychosis         . ascorbic acid (VITAMIN C) 500 MG tablet   Oral   Take 500 mg by mouth 2 (two) times daily.         . budesonide-formoterol (SYMBICORT) 160-4.5 MCG/ACT inhaler   Inhalation   Inhale 2 puffs into the lungs 2 (two) times daily.         . busPIRone (BUSPAR) 10 MG tablet   Oral   Take 10 mg by mouth 2 (two) times daily.         . cloNIDine (CATAPRES) 0.2  MG tablet   Oral   Take 0.2 mg by mouth 2 (two) times daily.         . divalproex (DEPAKOTE ER) 250 MG 24 hr tablet   Oral   Take 250 mg by mouth 2 (two) times daily.         . DULoxetine (CYMBALTA) 60 MG capsule   Oral   Take 60 mg by mouth 2 (two) times daily.         . famotidine (PEPCID) 20 MG tablet   Oral   Take 20 mg by mouth 2 (two) times daily.         . fluticasone (FLONASE) 50 MCG/ACT nasal spray   Nasal   Place 2 sprays into the nose daily.         . isosorbide mononitrate (IMDUR) 30 MG 24 hr tablet   Oral   Take 30 mg by mouth daily.         Marland Kitchen LORazepam (ATIVAN) 0.5 MG tablet      1 by mouth twice a day as needed for anxiety and agitation   60 tablet   5   . Multiple Vitamins-Minerals (CERTAGEN PO)   Oral   Take 1 tablet by mouth daily.         . nitroGLYCERIN (NITROSTAT) 0.4 MG SL tablet   Sublingual   Place 0.4 mg under the tongue every 5 (five) minutes as needed for chest pain.          Marland Kitchen senna (SENOKOT) 8.6 MG TABS    Oral   Take 2 tablets by mouth daily.         . simethicone (MYLICON) 80 MG chewable tablet   Oral   Chew 80 mg by mouth every 8 (eight) hours.         Marland Kitchen tiotropium (SPIRIVA) 18 MCG inhalation capsule   Inhalation   Place 18 mcg into inhaler and inhale daily.         . vitamin D, CHOLECALCIFEROL, 400 UNITS tablet   Oral   Take 400 Units by mouth daily.          BP 154/72  Pulse 86  Temp(Src) 98.7 F (37.1 C) (Oral)  Resp 24  SpO2 99% Physical Exam  Nursing note and vitals reviewed. Constitutional: She appears well-developed and well-nourished. No distress.  HENT:  Head: Normocephalic.  Trach with stoma tight. Some air movement with coughing. None noted on inspiration.   Eyes: Conjunctivae are normal. Right eye exhibits no discharge. Left eye exhibits no discharge.  Neck: Neck supple.  Cardiovascular: Normal rate, regular rhythm and normal heart sounds.  Exam reveals no gallop and no friction rub.   No murmur heard. Pulmonary/Chest: Effort normal and breath sounds normal. No respiratory distress.  Abdominal: Soft. She exhibits no distension. There is no tenderness.  Musculoskeletal: She exhibits no edema and no tenderness.  Neurological: She is alert.  Skin: Skin is warm and dry.  Psychiatric: She has a normal mood and affect. Her behavior is normal. Thought content normal.    ED Course  Procedures (including critical care time) Labs Review Labs Reviewed  CBC WITH DIFFERENTIAL - Abnormal; Notable for the following:    RBC 3.47 (*)    Hemoglobin 10.3 (*)    HCT 31.0 (*)    Eosinophils Relative 7 (*)    All other components within normal limits  COMPREHENSIVE METABOLIC PANEL - Abnormal; Notable for the following:    CO2 40 (*)  Albumin 3.1 (*)    Total Bilirubin 0.2 (*)    GFR calc non Af Amer 89 (*)    All other components within normal limits  CBC - Abnormal; Notable for the following:    RBC 3.25 (*)    Hemoglobin 9.6 (*)    HCT 28.6 (*)    All  other components within normal limits  BASIC METABOLIC PANEL - Abnormal; Notable for the following:    CO2 35 (*)    Creatinine, Ser 1.23 (*)    GFR calc non Af Amer 46 (*)    GFR calc Af Amer 53 (*)    All other components within normal limits  GLUCOSE, CAPILLARY - Abnormal; Notable for the following:    Glucose-Capillary 101 (*)    All other components within normal limits  CBC - Abnormal; Notable for the following:    RBC 3.30 (*)    Hemoglobin 9.8 (*)    HCT 29.2 (*)    All other components within normal limits  BASIC METABOLIC PANEL - Abnormal; Notable for the following:    CO2 35 (*)    GFR calc non Af Amer 67 (*)    GFR calc Af Amer 77 (*)    All other components within normal limits  PHOSPHORUS - Abnormal; Notable for the following:    Phosphorus 5.2 (*)    All other components within normal limits  GLUCOSE, CAPILLARY - Abnormal; Notable for the following:    Glucose-Capillary 100 (*)    All other components within normal limits  MRSA PCR SCREENING  URINE CULTURE  APTT  PROTIME-INR  MAGNESIUM  PHOSPHORUS  URINALYSIS, ROUTINE W REFLEX MICROSCOPIC  GLUCOSE, CAPILLARY  GLUCOSE, CAPILLARY  GLUCOSE, CAPILLARY  GLUCOSE, CAPILLARY  GLUCOSE, CAPILLARY  GLUCOSE, CAPILLARY  MAGNESIUM  GLUCOSE, CAPILLARY  GLUCOSE, CAPILLARY  GLUCOSE, CAPILLARY  GLUCOSE, CAPILLARY   Imaging Review No results found.  EKG Interpretation    Date/Time:    Ventricular Rate:    PR Interval:    QRS Duration:   QT Interval:    QTC Calculation:   R Axis:     Text Interpretation:              MDM   1. Acute respiratory failure   2. Morbid obesity   3. Tracheostomy in place   4. Type II or unspecified type diabetes mellitus with neurological manifestations, not stated as uncontrolled(250.60)   5. Acute and chronic respiratory failure (acute-on-chronic)   6. AKI (acute kidney injury)   7. Acute diastolic CHF (congestive heart failure)   8. Acute exacerbation of chronic  obstructive pulmonary disease (COPD)     62 year old female presenting after her tracheostomy cannula was dislodged. Pt with no acute respiratory complaints, seems to be mentating ok and 02 sats are good. Patient has multiple medical issues with regards to her pulmonary status and recommendation to keep trach long term. Tract is very tight. Tried to gently dilate with a gloved finger without much success. She is now moving a little bit of air with coughing and expiring. No movement with inspiration. Will discuss with critical care.   Raeford Razor, MD 06/05/13 [id number]

## 2013-06-01 NOTE — Procedures (Signed)
Bronchoscopy Procedure Note [name] [id number] [birth year]  Procedure: Bronchoscopy Indications: Trach Placement   Procedure Details Consent: Risks of procedure as well as the alternatives and risks of each were explained to the (patient/caregiver).  Consent for procedure obtained. Time Out: Verified patient identification, verified procedure, site/side was marked, verified correct patient position, special equipment/implants available, medications/allergies/relevent history reviewed, required imaging and test results available.  Performed  In preparation for procedure, patient was given 100% FiO2 and bronchoscope lubricated. Sedation: Etomidate  Airway entered and the following bronchi were examined: RUL, RML, RLL, LUL and LLL.   Procedures performed: Brushings performed Bronchoscope removed.    Evaluation Hemodynamic Status: BP stable throughout; O2 sats: stable throughout Patient's Current Condition: stable   Complications: No apparent complications Patient did tolerate procedure well.  Procedure performed under direct supervision of Dr. Molli Knock for tracheostomy placement.     Canary Brim, NP-C [place] [facility] Pgr: [phone] or [phone]  I was present and supervised the entire procedure.  Alyson Reedy, M.D. [facility] Pulmonary/Critical Care Medicine. Pager: [phone]. After hours pager: [phone].  06/01/2013

## 2013-06-01 NOTE — ED Notes (Signed)
MD Kohut at bedside. 

## 2013-06-01 NOTE — ED Notes (Signed)
Trey Paula RT at bedside

## 2013-06-01 NOTE — Procedures (Signed)
Intubation Procedure Note GLADYSE CORVIN [id number] [birth year]  Procedure: Intubation Indications: Trach Dislogdement - replacement  Procedure Details Consent: Risks of procedure as well as the alternatives and risks of each were explained to the (patient/caregiver).  Consent for procedure obtained. Time Out: Verified patient identification, verified procedure, site/side was marked, verified correct patient position, special equipment/implants available, medications/allergies/relevent history reviewed, required imaging and test results available.  Performed  Maximum sterile technique was used including antiseptics, cap, gloves, gown, hand hygiene and mask.  MAC and 3 - Glide Scope utilized.     Evaluation Hemodynamic Status: BP stable throughout; O2 sats: stable throughout Patient's Current Condition: stable Complications: No apparent complications Patient did tolerate procedure well. Chest X-ray ordered to verify placement.  CXR: pending   Procedure performed under direct supervision of Dr. Molli Knock.  Canary Brim, NP-C [place] [facility] Pgr: [phone] or [phone]  I was present and supervised the entire procedure.  Alyson Reedy, M.D. [facility] Pulmonary/Critical Care Medicine. Pager: [phone]. After hours pager: [phone].  06/01/2013

## 2013-06-01 NOTE — ED Notes (Signed)
Unable to transport pt, XR tech at bedside

## 2013-06-01 NOTE — ED Notes (Signed)
Per [facility] pt from [facility], per staff pt removed her own trach approx 1 hour ago, pt had her trach placed 1 month ago and has removed it several times, pt denies pulling her trach out. No respiratory distress, pt sats are 97% with NR placed over her stoma.

## 2013-06-01 NOTE — Procedures (Signed)
Emergent Bedside Trach Placement  Patient came in with a trach dislodged and stoma closed.  Patient sedated, intubated, positioned, cleaned.  Stoma dilated and wire placed and visualized bronchoscopically.  Stoma and trachea dilated.  Tracheostomy size 6 cuffed shiley placed without difficulty.  Bronchoscope placed through tracheostomy and was well above carina.  Patient tolerated the procedure well without complications.  CXR ordered and pending.  Alyson Reedy, M.D. [facility] Pulmonary/Critical Care Medicine. Pager: [phone]. After hours pager: [phone].

## 2013-06-01 NOTE — ED Notes (Signed)
MD Evern Core PA from critical Care at bedside.

## 2013-06-01 NOTE — H&P (Signed)
PULMONARY  / CRITICAL CARE MEDICINE  Name: [name] MRN: [id number] DOB: 1950-08-12    ADMISSION DATE:  06/01/2013 CONSULTATION DATE:  06/01/2013  REFERRING MD :  EDP PRIMARY SERVICE: PCCM  CHIEF COMPLAINT:  SOB after pulling out her tracheostomy.  BRIEF PATIENT DESCRIPTION: 62 year old morbidly obese female who is well known to our service, was recently discharged to SNF.  While in SNF patient coughed up her tracheostomy and patient was brought to the ED.  In the ED, multiple attempts to reinsert trach failed.  Based on that, and after discussion with patient and family, decision was made that the patient needs the tracheostomy back in.  PCCM was called to assist.  SIGNIFICANT EVENTS / STUDIES:  11/29 coughed out tracheostomy.  LINES / TUBES: ET tube 11/29>>>  CULTURES: None  ANTIBIOTICS: None  PAST MEDICAL HISTORY :  Past Medical History  Diagnosis Date  . COPD   . Hypertension   . Chronic respiratory failure     3 L oxygen  . GERD (gastroesophageal reflux disease)   . Diabetes mellitus   . Obesity, Class III, BMI 40-49.9 (morbid obesity)   . Obstructive sleep apnea on CPAP   . Peripheral vascular disease   . Bipolar disease, chronic   . Lymphedema   . COPD (chronic obstructive pulmonary disease)   . Cellulitis   . Coronary artery disease   . Asthma   . Myocardial infarction   . Vertigo    Past Surgical History  Procedure Laterality Date  . Abdominal hysterectomy    . Cholecystectomy    . Appendectomy    . Tubal ligation    . Tracheostomy     Prior to Admission medications   Medication Sig Start Date End Date Taking? Authorizing Provider  albuterol (PROVENTIL) (2.5 MG/3ML) 0.083% nebulizer solution Take 3 mLs (2.5 mg total) by nebulization every 4 (four) hours as needed for wheezing. 05/09/13  Yes Jeanella Craze, NP  amLODipine (NORVASC) 5 MG tablet Take 5 mg by mouth daily.   Yes Historical Provider, MD  arformoterol (BROVANA) 15 MCG/2ML NEBU Take  2 mLs (15 mcg total) by nebulization 2 (two) times daily. 05/21/13  Yes Calvert Cantor, MD  ARIPiprazole (ABILIFY) 10 MG tablet Take 10 mg by mouth daily. For depressive psychosis   Yes Historical Provider, MD  ascorbic acid (VITAMIN C) 500 MG tablet Take 500 mg by mouth 2 (two) times daily.   Yes Historical Provider, MD  budesonide-formoterol (SYMBICORT) 160-4.5 MCG/ACT inhaler Inhale 2 puffs into the lungs 2 (two) times daily.   Yes Historical Provider, MD  busPIRone (BUSPAR) 10 MG tablet Take 10 mg by mouth 2 (two) times daily.   Yes Historical Provider, MD  cloNIDine (CATAPRES) 0.2 MG tablet Take 0.2 mg by mouth 2 (two) times daily.   Yes Historical Provider, MD  divalproex (DEPAKOTE ER) 250 MG 24 hr tablet Take 250 mg by mouth 2 (two) times daily.   Yes Historical Provider, MD  DULoxetine (CYMBALTA) 60 MG capsule Take 60 mg by mouth 2 (two) times daily.   Yes Historical Provider, MD  famotidine (PEPCID) 20 MG tablet Take 20 mg by mouth 2 (two) times daily.   Yes Historical Provider, MD  fluticasone (FLONASE) 50 MCG/ACT nasal spray Place 2 sprays into the nose daily.   Yes Historical Provider, MD  isosorbide mononitrate (IMDUR) 30 MG 24 hr tablet Take 30 mg by mouth daily.   Yes Historical Provider, MD  nitroGLYCERIN (NITROSTAT) 0.4 MG  SL tablet Place 0.4 mg under the tongue every 5 (five) minutes as needed for chest pain.    Yes Historical Provider, MD  senna (SENOKOT) 8.6 MG TABS Take 2 tablets by mouth daily.   Yes Historical Provider, MD  simethicone (MYLICON) 80 MG chewable tablet Chew 80 mg by mouth every 8 (eight) hours.   Yes Historical Provider, MD  tiotropium (SPIRIVA) 18 MCG inhalation capsule Place 18 mcg into inhaler and inhale daily.   Yes Historical Provider, MD  vitamin D, CHOLECALCIFEROL, 400 UNITS tablet Take 400 Units by mouth daily.   Yes Historical Provider, MD   Allergies  Allergen Reactions  . Penicillins Anaphylaxis    Tongue swelling  . Aspirin Other (See Comments)     Per MAR  . Bee Venom Other (See Comments)    Unknown  . Lisinopril     Per MAR  . Nyquil Cold & [Dm-Doxylamine-Acetaminophen] Other (See Comments)    Unknown   . Ppd [Tuberculin Purified Protein Derivative] Other (See Comments)    unknown    FAMILY HISTORY:  Family History  Problem Relation Age of Onset  . Prostate cancer     SOCIAL HISTORY:  reports that she has never smoked. She has never used smokeless tobacco. She reports that she does not drink alcohol or use illicit drugs.  REVIEW OF SYSTEMS:  12 point ROS is negative except for SOB.  SUBJECTIVE:   VITAL SIGNS: Temp:  [98.7 F (37.1 C)] 98.7 F (37.1 C) (11/29 1225) Pulse Rate:  [84-87] 84 (11/29 1415) Resp:  [24] 24 (11/29 1225) BP: (96-154)/(27-117) 144/117 mmHg (11/29 1415) SpO2:  [97 %-100 %] 100 % (11/29 1415) HEMODYNAMICS:   VENTILATOR SETTINGS:   INTAKE / OUTPUT: Intake/Output   None     PHYSICAL EXAMINATION: General:  Chronically ill appearing morbidly obese female. Neuro:  Alert and interactive, NAD. HEENT:  [place]/AT, PERRL, EOM-I and MMM.  Tracheostomy stoma with air coming out of stoma. Cardiovascular:  RRR, Nl S1/S2, -M/R/G. Lungs:  Upper airway sound transmitted down. Abdomen:  Soft, NT, ND and +BS. Musculoskeletal:  -edema and -tenderness. Skin:  Intact.  LABS:  CBC No results found for this basename: WBC, HGB, HCT, PLT,  in the last 168 hours Coag's No results found for this basename: APTT, INR,  in the last 168 hours BMET No results found for this basename: NA, K, CL, CO2, BUN, CREATININE, GLUCOSE,  in the last 168 hours Electrolytes No results found for this basename: CALCIUM, MG, PHOS,  in the last 168 hours Sepsis Markers No results found for this basename: LATICACIDVEN, PROCALCITON, O2SATVEN,  in the last 168 hours ABG No results found for this basename: PHART, PCO2ART, PO2ART,  in the last 168 hours Liver Enzymes No results found for this basename: AST, ALT, ALKPHOS, BILITOT,  ALBUMIN,  in the last 168 hours Cardiac Enzymes No results found for this basename: TROPONINI, PROBNP,  in the last 168 hours Glucose No results found for this basename: GLUCAP,  in the last 168 hours  Imaging No results found.  CXR: Pending  ASSESSMENT / PLAN:  PULMONARY A: Morbid obesity, trach stoma not large enough to accommodate even a 4. P:   - Will admit to the ICU. - Intubate. - Re-trach.  CARDIOVASCULAR A: HTN. P:  - Continue home medications - amlodipine and catapres as ordered. - Monitor BP.  RENAL A:  No active issues. P:   - Check BMET now.  GASTROINTESTINAL A:  No active issues. P:   -  Feed post trach.  HEMATOLOGIC A:  No active issues. P:  - CBC now and in AM.  INFECTIOUS A:  No active infection. P:   - Monitor fever curve and WBC count.  ENDOCRINE A:  DM.   P:   - CBGs. - ISS.  NEUROLOGIC A:  No active issues. P:   - Monitor.  I have personally obtained a history, examined the patient, evaluated laboratory and imaging results, formulated the assessment and plan and placed orders.  CRITICAL CARE: The patient is critically ill with multiple organ systems failure and requires high complexity decision making for assessment and support, frequent evaluation and titration of therapies, application of advanced monitoring technologies and extensive interpretation of multiple databases. Critical Care Time devoted to patient care services described in this note is 45 minutes.   Alyson Reedy, M.D. [facility] Pulmonary/Critical Care Medicine. Pager: [phone]. After hours pager: [phone].

## 2013-06-01 NOTE — ED Notes (Signed)
Per family pt is sent to ED every weekend for trach removed, pt reports when they clean her trach on the weekends they always remove her trach and then they have to send her here to have it put back in. Family at bedside states "this never happens during the week."

## 2013-06-01 NOTE — ED Notes (Addendum)
Family at bedside, updated on pt's plan of care

## 2013-06-01 NOTE — Progress Notes (Signed)
Patient moved to the ICU.  Attempted to use conscious sedation for dilation and replacement of the trach without success.  Patient desaturated.  Decision was made at that point to intubate and mechanically ventilate patient then retrach.  Additional CC time of 45 min.  Alyson Reedy, M.D. [place] Pulmonary/Critical Care Medicine. Pager: [phone]. After hours pager: [phone].

## 2013-06-02 ENCOUNTER — Inpatient Hospital Stay (HOSPITAL_COMMUNITY): Payer: Medicaid Other

## 2013-06-02 DIAGNOSIS — J441 Chronic obstructive pulmonary disease with (acute) exacerbation: Secondary | ICD-10-CM

## 2013-06-02 DIAGNOSIS — I5031 Acute diastolic (congestive) heart failure: Secondary | ICD-10-CM

## 2013-06-02 DIAGNOSIS — I509 Heart failure, unspecified: Secondary | ICD-10-CM

## 2013-06-02 LAB — CBC
HCT: 28.6 % — ABNORMAL LOW (ref 36.0–46.0)
Hemoglobin: 9.6 g/dL — ABNORMAL LOW (ref 12.0–15.0)
RBC: 3.25 MIL/uL — ABNORMAL LOW (ref 3.87–5.11)

## 2013-06-02 LAB — URINE CULTURE: Culture: NO GROWTH

## 2013-06-02 LAB — PHOSPHORUS: Phosphorus: 2.7 mg/dL (ref 2.3–4.6)

## 2013-06-02 LAB — BASIC METABOLIC PANEL
CO2: 35 mEq/L — ABNORMAL HIGH (ref 19–32)
Chloride: 96 mEq/L (ref 96–112)
Creatinine, Ser: 1.23 mg/dL — ABNORMAL HIGH (ref 0.50–1.10)
Glucose, Bld: 93 mg/dL (ref 70–99)
Potassium: 4 mEq/L (ref 3.5–5.1)
Sodium: 140 mEq/L (ref 135–145)

## 2013-06-02 LAB — GLUCOSE, CAPILLARY
Glucose-Capillary: 101 mg/dL — ABNORMAL HIGH (ref 70–99)
Glucose-Capillary: 86 mg/dL (ref 70–99)
Glucose-Capillary: 87 mg/dL (ref 70–99)
Glucose-Capillary: 90 mg/dL (ref 70–99)
Glucose-Capillary: 92 mg/dL (ref 70–99)
Glucose-Capillary: 93 mg/dL (ref 70–99)

## 2013-06-02 MED ORDER — SODIUM CHLORIDE 0.9 % IV SOLN
INTRAVENOUS | Status: DC
  Administered 2013-06-02: 07:00:00 via INTRAVENOUS

## 2013-06-02 MED ORDER — CHLORHEXIDINE GLUCONATE 0.12 % MT SOLN
15.0000 mL | Freq: Two times a day (BID) | OROMUCOSAL | Status: DC
  Administered 2013-06-02 – 2013-06-03 (×3): 15 mL via OROMUCOSAL
  Filled 2013-06-02 (×3): qty 15

## 2013-06-02 NOTE — Progress Notes (Signed)
Utilization review completed.  P.J. Milta Croson,RN,BSN Case Manager [phone]  

## 2013-06-02 NOTE — Progress Notes (Signed)
Attempted ATC at this time per wean protocol. Pt did not tolerate. Pt still sleepy and desat to 70%, RT attempted to titrate Fio2 but pt Spo2 did not respond. RT placed back on vent and attempted PS/CPAP but pt went apneic. RT placed back on vent and will try again later. RT will continue to monitor.

## 2013-06-02 NOTE — Progress Notes (Signed)
PULMONARY  / CRITICAL CARE MEDICINE  Name: [name] MRN: [id number] DOB: Aug 31, 1950    ADMISSION DATE:  06/01/2013 CONSULTATION DATE:  06/01/2013  REFERRING MD :  EDP PRIMARY SERVICE: PCCM  CHIEF COMPLAINT:  SOB after pulling out her tracheostomy.  BRIEF PATIENT DESCRIPTION: 62 year old morbidly obese female who is well known to our service, was recently discharged to SNF.  While in SNF patient coughed up her tracheostomy and patient was brought to the ED.  In the ED, multiple attempts to reinsert trach failed.  Based on that, and after discussion with patient and family, decision was made that the patient needs the tracheostomy back in.  PCCM was called to assist.  SIGNIFICANT EVENTS / STUDIES:  11/29 coughed out tracheostomy. 11/29 size 6 cuffed shiley placed emergently after intubation.  LINES / TUBES: ET tube 11/29>>> 11/29 shiley 6 cuffed tube placed (JY)>>>  CULTURES: None  ANTIBIOTICS: None  SUBJECTIVE: feels well this AM but continues to cough up the trach.  VITAL SIGNS: Temp:  [98.7 F (37.1 C)-99.7 F (37.6 C)] 98.7 F (37.1 C) (11/30 0745) Pulse Rate:  [58-94] 64 (11/30 0800) Resp:  [14-30] 16 (11/30 0800) BP: (82-157)/(27-117) 112/59 mmHg (11/30 0800) SpO2:  [97 %-100 %] 100 % (11/30 0800) FiO2 (%):  [40 %-60 %] 40 % (11/30 0720) Weight:  [132 kg (291 lb 0.1 oz)] 132 kg (291 lb 0.1 oz) (11/29 1534) HEMODYNAMICS:   VENTILATOR SETTINGS: Vent Mode:  [-] PRVC FiO2 (%):  [40 %-60 %] 40 % Set Rate:  [16 bmp-18 bmp] 16 bmp Vt Set:  [500 mL] 500 mL PEEP:  [5 cmH20] 5 cmH20 Plateau Pressure:  [25 cmH20-29 cmH20] 25 cmH20  INTAKE / OUTPUT: Intake/Output     11/29 0701 - 11/30 0700 11/30 0701 - 12/01 0700   I.V. (mL/kg)  235.7 (1.8)   IV Piggyback 50    Total Intake(mL/kg) 50 (0.4) 235.7 (1.8)   Urine (mL/kg/hr) 515    Total Output 515     Net -465 +235.7         PHYSICAL EXAMINATION: General:  Chronically ill appearing morbidly obese  female. Neuro:  Alert and interactive, NAD. HEENT:  [place]/AT, PERRL, EOM-I and MMM.  Tracheostomy in place. Cardiovascular:  RRR, Nl S1/S2, -M/R/G. Lungs:  Upper airway sound transmitted down. Abdomen:  Soft, NT, ND and +BS. Musculoskeletal:  -edema and -tenderness. Skin:  Intact.  LABS:  CBC  Recent Labs Lab 06/01/13 1503 06/02/13 0404  WBC 8.6 9.3  HGB 10.3* 9.6*  HCT 31.0* 28.6*  PLT 288 275   Coag's  Recent Labs Lab 06/01/13 1503  APTT 29  INR 0.95   BMET  Recent Labs Lab 06/01/13 1503 06/02/13 0404  NA 140 140  K 4.4 4.0  CL 97 96  CO2 40* 35*  BUN 11 15  CREATININE 0.73 1.23*  GLUCOSE 97 93   Electrolytes  Recent Labs Lab 06/01/13 1503 06/02/13 0404  CALCIUM 10.0 10.1  MG  --  1.6  PHOS  --  2.7   Sepsis Markers No results found for this basename: LATICACIDVEN, PROCALCITON, O2SATVEN,  in the last 168 hours ABG No results found for this basename: PHART, PCO2ART, PO2ART,  in the last 168 hours Liver Enzymes  Recent Labs Lab 06/01/13 1503  AST 17  ALT 14  ALKPHOS 69  BILITOT 0.2*  ALBUMIN 3.1*   Cardiac Enzymes No results found for this basename: TROPONINI, PROBNP,  in the last 168 hours Glucose  Recent Labs Lab 06/01/13 1733 06/01/13 1938 06/02/13 0008 06/02/13 0413 06/02/13 0716  GLUCAP 90 84 87 101* 90   Imaging Dg Chest Port 1 View  06/01/2013   CLINICAL DATA:  Evaluate trach placement, evaluate for pneumothorax  EXAM: PORTABLE CHEST - 1 VIEW  COMPARISON:  05/19/2013  FINDINGS: Tracheostomy in satisfactory position, terminating at the thoracic inlet.  Low lung volumes. No focal consolidation. No pleural effusion or pneumothorax.  Mild cardiomegaly.  Interval removal of prior right arm PICC.  IMPRESSION: Tracheostomy in satisfactory position.  No pneumothorax.   Electronically Signed   By: Charline Bills M.D.   On: 06/01/2013 17:22   Dg Chest Port 1v Same Day  06/02/2013   CLINICAL DATA:  Tracheostomy  EXAM: PORTABLE CHEST  - 1 VIEW SAME DAY  COMPARISON:  the previous day's study  FINDINGS: Tracheostomy device stable in position. Relatively low lung volumes with no focal consolidation or overt edema. No effusion evident. Heart size upper limits normal for technique and degree of inspiration. Tortuous aorta. Spurring in the mid thoracic spine.  IMPRESSION: 1. Stable appearance since previous day's portable exam.   Electronically Signed   By: Oley Balm M.D.   On: 06/02/2013 07:45    CXR: Pending  ASSESSMENT / PLAN:  PULMONARY A: Morbid obesity, trach stoma not large enough to accommodate even a 4. P:   - Trach in place, will attempt TC today. - If tolerating TC will move to SDU. - Needs trach change to a proximal extralong six cuffless if tolerating TC.  CARDIOVASCULAR A: HTN. P:  - Continue home medications - amlodipine and catapres as ordered. - Monitor BP.  RENAL A:  No active issues. P:   - Check BMET in AM. - Replace electrolytes as needed.  GASTROINTESTINAL A:  No active issues. P:   - Continue diet.  HEMATOLOGIC A:  No active issues. P:  - CBC now and in AM.  INFECTIOUS A:  No active infection. P:   - Monitor fever curve and WBC count.  ENDOCRINE A:  DM.   P:   - CBGs. - ISS.  NEUROLOGIC A:  No active issues. P:   - Monitor.  I have personally obtained a history, examined the patient, evaluated laboratory and imaging results, formulated the assessment and plan and placed orders.  CRITICAL CARE: The patient is critically ill with multiple organ systems failure and requires high complexity decision making for assessment and support, frequent evaluation and titration of therapies, application of advanced monitoring technologies and extensive interpretation of multiple databases. Critical Care Time devoted to patient care services described in this note is 35 minutes.   Alyson Reedy, M.D. [facility] Pulmonary/Critical Care Medicine. Pager: [phone]. After hours pager:  [phone].

## 2013-06-02 NOTE — Procedures (Signed)
First Trach Change  Patient trached yesterday, needs a proximal xlt cuffless, good color change.  CXR pending.  Alyson Reedy, M.D. [facility] Pulmonary/Critical Care Medicine. Pager: [phone]. After hours pager: [phone].

## 2013-06-03 LAB — CBC
Hemoglobin: 9.8 g/dL — ABNORMAL LOW (ref 12.0–15.0)
MCHC: 33.6 g/dL (ref 30.0–36.0)
RBC: 3.3 MIL/uL — ABNORMAL LOW (ref 3.87–5.11)

## 2013-06-03 LAB — BASIC METABOLIC PANEL
GFR calc non Af Amer: 67 mL/min — ABNORMAL LOW (ref >90)
Glucose, Bld: 90 mg/dL (ref 70–99)
Potassium: 3.8 mEq/L (ref 3.5–5.1)
Sodium: 143 mEq/L (ref 135–145)

## 2013-06-03 LAB — GLUCOSE, CAPILLARY
Glucose-Capillary: 88 mg/dL (ref 70–99)
Glucose-Capillary: 89 mg/dL (ref 70–99)

## 2013-06-03 LAB — PHOSPHORUS: Phosphorus: 5.2 mg/dL — ABNORMAL HIGH (ref 2.3–4.6)

## 2013-06-03 MED ORDER — CLONIDINE HCL 0.2 MG/24HR TD PTWK
0.2000 mg | MEDICATED_PATCH | TRANSDERMAL | Status: DC
  Administered 2013-06-03: 0.2 mg via TRANSDERMAL
  Filled 2013-06-03: qty 1

## 2013-06-03 MED ORDER — AMLODIPINE BESYLATE 5 MG PO TABS: 10.0000 mg | ORAL_TABLET | Freq: Every day | ORAL | Status: DC

## 2013-06-03 MED ORDER — MAGNESIUM SULFATE 40 MG/ML IJ SOLN
2.0000 g | Freq: Once | INTRAMUSCULAR | Status: AC
  Administered 2013-06-03: 2 g via INTRAVENOUS
  Filled 2013-06-03: qty 50

## 2013-06-03 NOTE — Progress Notes (Signed)
Patient ID: [name], female   DOB: July 11, 1950, 62 y.o.   MRN: [id number]     [place]   Allergies  Allergen Reactions  . Penicillins Anaphylaxis    Tongue swelling  . Aspirin Other (See Comments)    Per MAR  . Bee Venom Other (See Comments)    Unknown  . Lisinopril     Per MAR  . Nyquil Cold & [Dm-Doxylamine-Acetaminophen] Other (See Comments)    Unknown   . Ppd [Tuberculin Purified Protein Derivative] Other (See Comments)    unknown    Chief Complaint  Patient presents with  . Medical Managment of Chronic Issues    HPI She is being seen for the management of her chronic illnesses. Overall there has been no recent change in her status. There are no concerns being voiced by nursing staff at this time. She is not voicing any complaints or concerns at this time.   Past Medical History  Diagnosis Date  . COPD   . Hypertension   . Chronic respiratory failure     3 L oxygen  . GERD (gastroesophageal reflux disease)   . Diabetes mellitus   . Obesity, Class III, BMI 40-49.9 (morbid obesity)   . Obstructive sleep apnea on CPAP   . Peripheral vascular disease   . Bipolar disease, chronic   . Lymphedema   . COPD (chronic obstructive pulmonary disease)   . Cellulitis   . Coronary artery disease   . Asthma   . Myocardial infarction   . Vertigo      Past Surgical History  Procedure Laterality Date  . Abdominal hysterectomy    . Cholecystectomy    . Appendectomy    . Tubal ligation    . Tracheostomy      Filed Vitals:   10/18/12 1657  BP: 112/82  Pulse: 92  Height: 5\' 3"  (1.6 m)  Weight: 319 lb (144.697 kg)    MEDICATIONS abilify 5 mg daily mvi daily spirivia 18 mcg daily Vit d 400 units daily flonase 2 puffs daily Folic acid 1 mg daily imdur 30 mg daily Lasix 60 mg daily norvasc 5 mg daily pataday to both eyes daily Klonopin 0.5 mg daily symbicort 160/4.5 mcg 2 puffs twice daily buspar 10 mg twice daily Clonidine 0.2 mg twice  daily depakote 500 mg twice daily Ca++ 500 mg twice daily protonix 40 mg twice daily cymbalta 60 mg twice daily anitvert 25 mg twice daily prn systane balance to both eyes twice daily Ms contin 15 mg twice daily neurontin 400 mg three times daily ntg 0.4 prn Percocet 5/325 mg every 4 hours as needed ambien 5 mg hs prn Albuterol neb every 6 hours prn BIPAP at hs   LABS REVIEWED:  05-25-12: wbc 5.9; hgb 9.2; hct 28.7; mcv 85 plt 226; glucose 89; bun 23; creat 1.01; k+4.3; na++142; liver normal albumin 3.6; chol 167; ldl 85; trig 103; hgb a1c 6.2; depakote 40 06-04-12: glucose 84; bun 15; creat 0.85; k+4.1; na++ 139; hgb a1c 6.2; depakote 46   Review of Systems  Constitutional: Negative for malaise/fatigue.  Respiratory: Negative for cough, shortness of breath and wheezing.   Cardiovascular: Negative for chest pain and palpitations.  Gastrointestinal: Negative for heartburn, abdominal pain and constipation.  Musculoskeletal: Positive for back pain, joint pain and myalgias.       Pain is managed  Skin: Negative.   Neurological: Negative for headaches.  Psychiatric/Behavioral: The patient does not have insomnia.  Physical Exam  Constitutional: She appears well-developed and well-nourished. No distress.  obese  Neck: Neck supple. No JVD present.  Cardiovascular: Normal rate and regular rhythm.   Pedal pulses nonpalpable   Respiratory: Effort normal and breath sounds normal. No respiratory distress.  GI: Soft. Bowel sounds are normal. She exhibits no distension. There is no tenderness.  Musculoskeletal: Normal range of motion. She exhibits edema.  2+ lower extremity edema  Neurological: She is alert.  Skin: Skin is warm and dry. She is not diaphoretic.  Left lower extremity wrapped   Psychiatric: She has a normal mood and affect.     ASSESSMENT/PLAN  1. Copd: is presently stable will continue her spiriva daily; O2; flonase 2 puffs daily; albuterol neb treatments every  6 hours prn; symbicort 150/4.5 mcg 2 puffs twice daily and will continue to monitor her status.   2. Hypertension: is stable will continue norvasc 5 mg daily; clonidine 0.2 mg twice daily and will monitor  3. Diastolic heart failure: is stable will continue her lasix 60 mg daily and will monitor   4. Chest pain: is stable there have not complaints of chest pain present will continue imdur 30 mg daily and ntg prn and will monitor  5. Genella Rife: is stable will continue protonix 40 mg twice daily  6. bipolar disorder: she is emotionally stable at this time; will continue her abilify 5 mg daily; buspar 10 mg twice daily for anxiety;  Klonopin 0.5 mg daily for anxiety; depakote 500 mg twice daily to help stabilize mood; cymbalta 60 mg twice daily -she also takes this medication for pain management; and ambien 5 mg nightly as needed for sleep will monitor  7. Chronic pain: is well managed; will continue ms contin 15 mg twice daily; neurontin 400 mg three times daily for neuropathic pain; cymbalta 60 mg twice daily; and percocet 5/325 mg every 4 hours as needed for pain and will monitor   8. Constipation: will continue senna 2 tabs daily  9. Obstructive sleep apnea: no change in status; will continue bipap at night and will continue to monitor    Time spent with patient 50 minutes.

## 2013-06-03 NOTE — Progress Notes (Signed)
Pt has remained off vent and on 40% ATC all night without distress. RR 20-24, Sats 97-99%. Minimal secretions and responsive. Vent remains at bedside. RT will continue to monitor.

## 2013-06-03 NOTE — Clinical Social Work Psychosocial (Signed)
Clinical Social Work Department BRIEF PSYCHOSOCIAL ASSESSMENT 06/03/2013  Patient:  [name], [name]     Account Number:  [id number]     Admit date:  06/01/2013  Clinical Social Worker:  [employer]  Date/Time:  06/03/2013 12:15 PM  Referred by:  Care Management  Date Referred:  06/03/2013 Referred for  SNF Placement   Other Referral:   none   Interview type:  Patient Other interview type:   left message for Aurea Graff, pt daughter to inform of dc back to [facility] SNF    PSYCHOSOCIAL DATA Living Status:  FACILITY Admitted from facility:  [facility] Level of care:  Skilled Nursing Facility Primary support name:  Aurea Graff Primary support relationship to patient:  CHILD, ADULT Degree of support available:   adequate    CURRENT CONCERNS Current Concerns  Post-Acute Placement   Other Concerns:   none    SOCIAL WORK ASSESSMENT / PLAN CSW introduced self and CSW role.  Pt is from [facility] SNF long term care and will return to SNF upon dc.  CSW contact pt daughter to inform of pt dc back to SNF.  CSW left message.   Assessment/plan status:  Psychosocial Support/Ongoing Assessment of Needs Other assessment/ plan:   none   Information/referral to community resources:   SNF    PATIENT'S/FAMILY'S RESPONSE TO PLAN OF CARE: Pt was thankful of CSW assistance and support.       Vickii Penna, LCSWA [phone]  Clinical Social Work

## 2013-06-03 NOTE — Evaluation (Signed)
Clinical/Bedside Swallow Evaluation Patient Details  Name: [name] MRN: [id number] Date of Birth: 1950-09-25  Today's Date: 06/03/2013 Time: 4098-1191 SLP Time Calculation (min): 39 min  Past Medical History:  Past Medical History  Diagnosis Date  . COPD   . Hypertension   . Chronic respiratory failure     3 L oxygen  . GERD (gastroesophageal reflux disease)   . Diabetes mellitus   . Obesity, Class III, BMI 40-49.9 (morbid obesity)   . Obstructive sleep apnea on CPAP   . Peripheral vascular disease   . Bipolar disease, chronic   . Lymphedema   . COPD (chronic obstructive pulmonary disease)   . Cellulitis   . Coronary artery disease   . Asthma   . Myocardial infarction   . Vertigo    Past Surgical History:  Past Surgical History  Procedure Laterality Date  . Abdominal hysterectomy    . Cholecystectomy    . Appendectomy    . Tubal ligation    . Tracheostomy     HPI:  [name] is a 62 year old morbidly obese female who is well known to our service, was recently discharged to SNF. On 11/28 while in SNF patient coughed up her tracheostomy and patient was brought to the ED. In the ED, multiple attempts to reinsert trach failed. Attempt was made to dilate and replace trach with conscious sedation but pt did not tolerate r/t desaturating.  Pt was then intubated and bronchoscopy performed to replace trach.  Once trach replaced pt tolerated back to trach collar without difficulty and has tol ATC since. She was ultimately changed to proximal XLT cuffless trach and is tolerating well.  Due to difficulty with trach placement will have speech assess and perform swallow eval prior to d/c to ensure she is safe for po nutrition as before.  Otherwise she is ready for d/c back to SNF with trach replaced.   Assessment / Plan / Recommendation Clinical Impression  Pt. was provided with a replacement PMSV as her PMSV was not sent with her from the SNF.  No change in VS after  PMSV placed, and pt. had immediate phonation and was communicating her needs.  No overt s/s of aspiration noted.  Pt. is edentulous, reporting that she plans to get dentures in the near future.  She chewed a saltine cracker without difficulty, and reports she has tolerated mechanical soft diet with chopped meats.  Will resume this diet.    Aspiration Risk  Mild    Diet Recommendation Dysphagia 3 (Mechanical Soft);Thin liquid (Chopped meats)   Liquid Administration via: Straw;Cup Medication Administration: Whole meds with puree (May attempt pills with liquid, as tolerated) Supervision: Intermittent supervision to cue for compensatory strategies;Staff to assist with self feeding Compensations: Slow rate;Small sips/bites Postural Changes and/or Swallow Maneuvers: Seated upright 90 degrees (wear PMSV with po's)    Other  Recommendations Oral Care Recommendations: Oral care BID Other Recommendations: Place PMSV during PO intake;Have oral suction available;Clarify dietary restrictions   Follow Up Recommendations  Skilled Nursing facility    Frequency and Duration        Pertinent Vitals/Pain CXR:  Increased pulmonary vascular  congestion.  Afebrile   SLP Swallow Goals  Defer to SNF (pt. For d/c to SNF today)   Swallow Study Prior Functional Status       General HPI: [name] is a 62 year old morbidly obese female who is well known to our service, was recently discharged to SNF.  On 11/28 while in SNF patient coughed up her tracheostomy and patient was brought to the ED. In the ED, multiple attempts to reinsert trach failed. Attempt was made to dilate and replace trach with conscious sedation but pt did not tolerate r/t desaturating.  Pt was then intubated and bronchoscopy performed to replace trach.  Once trach replaced pt tolerated back to trach collar without difficulty and has tol ATC since. She was ultimately changed to proximal XLT cuffless trach and is tolerating well.  Due  to difficulty with trach placement will have speech assess and perform swallow eval prior to d/c to ensure she is safe for po nutrition as before.  Otherwise she is ready for d/c back to SNF with trach replaced. Type of Study: Bedside swallow evaluation Previous Swallow Assessment: Seen on previous admission; Dysphagia 3 with chopped meats and thin liquids recommended.   Diet Prior to this Study: NPO Temperature Spikes Noted: No Respiratory Status: Trach collar Trach Size and Type: #6;Extra long;With PMSV in place (Replacement PMSV provided per MD request.) History of Recent Intubation: Yes Length of Intubations (days): 1 days (Trach replaced after pt. coughed out) Date extubated: 06/02/13 Behavior/Cognition: Alert;Cooperative;Pleasant mood Oral Cavity - Dentition: Edentulous Self-Feeding Abilities: Able to feed self;Needs set up Patient Positioning: Upright in bed Baseline Vocal Quality: Clear Volitional Cough: Strong Volitional Swallow: Able to elicit    Oral/Motor/Sensory Function Overall Oral Motor/Sensory Function: Appears within functional limits for tasks assessed   Ice Chips Ice chips: Within functional limits Presentation: Spoon   Thin Liquid Thin Liquid: Within functional limits Presentation: Spoon;Cup;Straw    Nectar Thick Nectar Thick Liquid: Within functional limits Presentation: Cup;Straw   Honey Thick Honey Thick Liquid: Not tested   Puree Puree: Within functional limits   Solid   GO    Solid: Impaired Presentation: Self Fed Oral Phase Impairments: Impaired anterior to posterior transit (Due to no dentition)       Oseph Imburgia T 06/03/2013,11:31 AM

## 2013-06-03 NOTE — Care Management Note (Signed)
    Page 1 of 1   06/03/2013     11:29:22 AM   CARE MANAGEMENT NOTE 06/03/2013  Patient:  [name], [name]   Account Number:  [id number]  Date Initiated:  06/03/2013  Documentation initiated by:  [facility]  Subjective/Objective Assessment:   Admitted with trach issue.  Ended up being intubated and then trach dilated for replacement.     Action/Plan:   Anticipated DC Date:  06/03/2013   Anticipated DC Plan:  SKILLED NURSING FACILITY  In-house referral  Clinical Social Worker      DC Planning Services  CM consult      Choice offered to / List presented to:             Status of service:  Completed, signed off Medicare Important Message given?   (If response is "NO", the following Medicare IM given date fields will be blank) Date Medicare IM given:   Date Additional Medicare IM given:    Discharge Disposition:  SKILLED NURSING FACILITY  Per UR Regulation:  Reviewed for med. necessity/level of care/duration of stay  If discussed at Long Length of Stay Meetings, dates discussed:    Comments:  06-03-13 11:20am Avie Arenas, RNBSN [phone] Trach stabilized - plan for dc back to SNF today.

## 2013-06-03 NOTE — Discharge Summary (Addendum)
Physician Discharge Summary  Patient ID: [name] MRN: [id number] DOB/AGE: October 05, 1950 62 y.o.  Admit date: 06/01/2013   Discharge date: 06/03/2013   Discharge Diagnoses:  Active Problems:   Acute respiratory failure   Brief Summary: [name] is a 62 year old morbidly obese female who is well known to our service, was recently discharged to SNF. On 11/28 while in SNF patient coughed up her tracheostomy and patient was brought to the ED. In the ED, multiple attempts to reinsert trach failed. Attempt was made to dilate and replace trach with conscious sedation but pt did not tolerate r/t desaturating.  Pt was then intubated and bronchoscopy performed to replace trach.  Once trach replaced pt tolerated back to trach collar without difficulty and has tol ATC since. She was ultimately changed to proximal XLT cuffless trach and is tolerating well.  Due to difficulty with trach placement will have speech assess and perform swallow eval prior to d/c to ensure she is safe for po nutrition as before.  Otherwise she is ready for d/c back to SNF with trach replaced.    SIGNIFICANT EVENTS / STUDIES:  11/29 coughed out tracheostomy 11/29 intubated 11/29 bronchoscopy and trach placement - stoma dilated, #6 shiley  11/30 trach changed to proximal XLT cuffless  12/1 tol ATC overnight   LINES / TUBES:  ET tube 11/29>>>11/29 Trach (replaced) 11/29>>>  CULTURES:  None   ANTIBIOTICS:  None                                                                    Hospital Summary by Discharge Diagnosis   Filed Vitals:   06/03/13 0801 06/03/13 0826 06/03/13 0900 06/03/13 0939  BP: 143/67  125/55   Pulse: 85  84   Temp:  98.2 F (36.8 C)    TempSrc:  Oral    Resp: 24  26   Height:      Weight:      SpO2: 97%  98% 98%     Discharge Labs  BMET  Recent Labs Lab 06/01/13 1503 06/02/13 0404 06/03/13 0405  NA 140 140 143  K 4.4 4.0 3.8  CL 97 96 99  CO2 40* 35* 35*   GLUCOSE 97 93 90  BUN 11 15 12   CREATININE 0.73 1.23* 0.90  CALCIUM 10.0 10.1 9.9  MG  --  1.6 1.6  PHOS  --  2.7 5.2*     CBC   Recent Labs Lab 06/01/13 1503 06/02/13 0404 06/03/13 0405  HGB 10.3* 9.6* 9.8*  HCT 31.0* 28.6* 29.2*  WBC 8.6 9.3 7.2  PLT 288 275 258   Anti-Coagulation  Recent Labs Lab 06/01/13 1503  INR 0.95          Follow-up Information   Follow up with [facility], MD. Call in 1 day. (Nursing home MD )    Specialty:  Internal Medicine   Contact information:   [address] [place] [address] [phone]          Medication List         albuterol (2.5 MG/3ML) 0.083% nebulizer solution  Commonly known as:  PROVENTIL  Take 3 mLs (2.5 mg total) by nebulization every 4 (four) hours as needed for wheezing.  amLODipine 5 MG tablet  Commonly known as:  NORVASC  Take 2 tablets (10 mg total) by mouth daily.     arformoterol 15 MCG/2ML Nebu  Commonly known as:  BROVANA  Take 2 mLs (15 mcg total) by nebulization 2 (two) times daily.     ARIPiprazole 10 MG tablet  Commonly known as:  ABILIFY  Take 10 mg by mouth daily. For depressive psychosis     ascorbic acid 500 MG tablet  Commonly known as:  VITAMIN C  Take 500 mg by mouth 2 (two) times daily.     budesonide-formoterol 160-4.5 MCG/ACT inhaler  Commonly known as:  SYMBICORT  Inhale 2 puffs into the lungs 2 (two) times daily.     busPIRone 10 MG tablet  Commonly known as:  BUSPAR  Take 10 mg by mouth 2 (two) times daily.     cloNIDine 0.2 MG tablet  Commonly known as:  CATAPRES  Take 0.2 mg by mouth 2 (two) times daily.     divalproex 250 MG 24 hr tablet  Commonly known as:  DEPAKOTE ER  Take 250 mg by mouth 2 (two) times daily.     DULoxetine 60 MG capsule  Commonly known as:  CYMBALTA  Take 60 mg by mouth 2 (two) times daily.     famotidine 20 MG tablet  Commonly known as:  PEPCID  Take 20 mg by mouth 2 (two) times daily.     fluticasone 50  MCG/ACT nasal spray  Commonly known as:  FLONASE  Place 2 sprays into the nose daily.     isosorbide mononitrate 30 MG 24 hr tablet  Commonly known as:  IMDUR  Take 30 mg by mouth daily.     nitroGLYCERIN 0.4 MG SL tablet  Commonly known as:  NITROSTAT  Place 0.4 mg under the tongue every 5 (five) minutes as needed for chest pain.     senna 8.6 MG Tabs tablet  Commonly known as:  SENOKOT  Take 2 tablets by mouth daily.     simethicone 80 MG chewable tablet  Commonly known as:  MYLICON  Chew 80 mg by mouth every 8 (eight) hours.     tiotropium 18 MCG inhalation capsule  Commonly known as:  SPIRIVA  Place 18 mcg into inhaler and inhale daily.     vitamin D (CHOLECALCIFEROL) 400 UNITS tablet  Take 400 Units by mouth daily.          Disposition: 03-Skilled Nursing Facility  Discharged Condition: [name] has met maximum benefit of inpatient care and is medically stable and cleared for discharge.  Patient is pending follow up as above.      Time spent on disposition:  Greater than 35 minutes.   SignedDanford Bad, NP 06/03/2013  9:51 AM Pager: [phone] or [phone]  *Care during the described time interval was provided by me and/or other providers on the critical care team. I have reviewed this patient's available data, including medical history, events of note, physical examination and test results as part of my evaluation.   Lonia Farber, MD Pulmonary and Critical Care Medicine [facility] Pager: [phone]

## 2013-06-04 ENCOUNTER — Non-Acute Institutional Stay (SKILLED_NURSING_FACILITY): Payer: Medicaid Other | Admitting: Internal Medicine

## 2013-06-04 DIAGNOSIS — E1149 Type 2 diabetes mellitus with other diabetic neurological complication: Secondary | ICD-10-CM

## 2013-06-04 DIAGNOSIS — D638 Anemia in other chronic diseases classified elsewhere: Secondary | ICD-10-CM

## 2013-06-04 DIAGNOSIS — J449 Chronic obstructive pulmonary disease, unspecified: Secondary | ICD-10-CM

## 2013-06-04 DIAGNOSIS — I10 Essential (primary) hypertension: Secondary | ICD-10-CM

## 2013-06-04 DIAGNOSIS — J4489 Other specified chronic obstructive pulmonary disease: Secondary | ICD-10-CM

## 2013-06-12 NOTE — Progress Notes (Signed)
Patient ID: [name], female   DOB: Apr 14, 1951, 62 y.o.   MRN: [id number]     [facility]  Allergies  Allergen Reactions  . Penicillins Anaphylaxis    Tongue swelling  . Aspirin Other (See Comments)    Per MAR  . Bee Venom Other (See Comments)    Unknown  . Lisinopril     Per MAR  . Nyquil Cold & [Dm-Doxylamine-Acetaminophen] Other (See Comments)    Unknown   . Ppd [Tuberculin Purified Protein Derivative] Other (See Comments)    unknown    Chief Complaint  Patient presents with  . Acute Visit    tremor    HPI She is complaining of a tremor involving her head. She is also having a tremor of both hands when eating and holding items; there is no resting tremor present. She states that she has had this tremor for a long period of time and is looking for something to help with the tremors.   Past Medical History  Diagnosis Date  . COPD   . Hypertension   . Chronic respiratory failure     3 L oxygen  . GERD (gastroesophageal reflux disease)   . Diabetes mellitus   . Obesity, Class III, BMI 40-49.9 (morbid obesity)   . Obstructive sleep apnea on CPAP   . Peripheral vascular disease   . Bipolar disease, chronic   . Lymphedema   . COPD (chronic obstructive pulmonary disease)   . Cellulitis   . Coronary artery disease   . Asthma   . Myocardial infarction   . Vertigo     Past Surgical History  Procedure Laterality Date  . Abdominal hysterectomy    . Cholecystectomy    . Appendectomy    . Tubal ligation    . Tracheostomy      Filed Vitals:   02/11/13 2054  BP: 124/77  Pulse: 80  Height: 5\' 3"  (1.6 m)  Weight: 305 lb (138.347 kg)    MEDICATIONS  Albuterol neb every 6 hours prn CPAP spirivia 18 mcg hanidhaler daily Liquid tears both eyes twice daily Vit d 400 units daily zaroxyln 5 mg every 3rd day depaktoe 250 mg twice daily Lasix 40 mg twice daily Lisinopril 5 mg daily Percocet 5/325 mg every 8 hours as needed norvasc 5 mg daily abilify  10 mg daily Vit c bid Folic acid 1 mg daily neurontin 400 mg tid imdur 30 mg daily reglan 5 mg tid ntg 0.4 mg prn Colace daily protonix 40 mg twice daily pataday both eyes daily Senna 2 tabs daily Mylicon 80 mg every 8 hours Clonidine 0.2 mg bid symbicort 160/4.5 mcg 2 puffs twice daily buspar 10 mg bid Ca++ 500 mg bid miv cymbalta 60 mg bid flonase nightly    LABS REVIEWED:   01-29-13: wbc 8.2; hgb 11.9; hct 37.9; mcv 86; plt 357; glucose 108; bun 38; creat 1.00;  K+4,3; na++135; ca++ 10.4; depakote 47  01-30-13: urine culture: strept B: doxycycline     Review of Systems  Constitutional: Negative for malaise/fatigue.  Eyes: Negative for blurred vision.  Respiratory: Positive for shortness of breath. Negative for cough.   Cardiovascular: Negative for chest pain and palpitations.  Gastrointestinal: Negative for heartburn and abdominal pain.  Musculoskeletal: Negative for joint pain and myalgias.  Skin: Negative.   Neurological: Positive for tremors. Negative for headaches.       Has head tremor and bilateral hand tremor when eating and holding items   Psychiatric/Behavioral: Negative for  depression. The patient is not nervous/anxious.      Physical Exam  Constitutional: She appears well-developed and well-nourished. No distress.  obesity  Neck: Neck supple. No JVD present.  Cardiovascular: Normal rate and regular rhythm.   Respiratory: Effort normal and breath sounds normal. No respiratory distress. She has no wheezes.  GI: Soft. Bowel sounds are normal. She exhibits no distension. There is no tenderness.  Musculoskeletal: She exhibits no edema.  Neurological: She is alert. She displays normal reflexes.  Has head tremor; has bilateral tremor with activity   Skin: Skin is warm and dry. She is not diaphoretic.  Psychiatric: She has a normal mood and affect.     ASSESSMENT/PLAN 1. Tremor: will increase her neurontin to 600 mg tid and will continue to monitor her  status and will monitor her status

## 2013-06-19 DIAGNOSIS — I2510 Atherosclerotic heart disease of native coronary artery without angina pectoris: Secondary | ICD-10-CM | POA: Insufficient documentation

## 2013-06-19 NOTE — Progress Notes (Signed)
Patient ID: [name], female   DOB: 19-Jul-1950, 62 y.o.   MRN: [id number]        HISTORY & PHYSICAL  DATE: 05/14/2013     FACILITY: [facility]  LEVEL OF CARE: SNF (31)  ALLERGIES:  Allergies  Allergen Reactions  . Penicillins Anaphylaxis    Tongue swelling  . Aspirin Other (See Comments)    Per MAR  . Bee Venom Other (See Comments)    Unknown  . Lisinopril     Per MAR  . Nyquil Cold & [Dm-Doxylamine-Acetaminophen] Other (See Comments)    Unknown   . Ppd [Tuberculin Purified Protein Derivative] Other (See Comments)    unknown    CHIEF COMPLAINT:  Manage COPD, CAD, and hypertension.    HISTORY OF PRESENT ILLNESS:  The patient is a 62 year-old, African-American female who was hospitalized secondary to altered mental status and respiratory insufficiency.  After hospitalization, patient is readmitted back to this facility for long-term care management.   She has the following problems:    COPD: the COPD remains stable.  Pt denies sob, cough, wheezing or declining exercise tolerance.  No complications from the medications presently being used.  The patient is now trach dependent.    CAD: The angina has been stable. The patient denies dyspnea on exertion, orthopnea, pedal edema, palpitations and paroxysmal nocturnal dyspnea. No complications noted from the medication presently being used.    HTN: Pt 's HTN remains stable.  Denies CP, sob, DOE, pedal edema, headaches, dizziness or visual disturbances.  No complications from the medications currently being used.  Last BP :   124/64.    PAST MEDICAL HISTORY :  Past Medical History  Diagnosis Date  . COPD   . Hypertension   . Chronic respiratory failure     3 L oxygen  . GERD (gastroesophageal reflux disease)   . Diabetes mellitus   . Obesity, Class III, BMI 40-49.9 (morbid obesity)   . Obstructive sleep apnea on CPAP   . Peripheral vascular disease   . Bipolar disease, chronic   . Lymphedema   . COPD  (chronic obstructive pulmonary disease)   . Cellulitis   . Coronary artery disease   . Asthma   . Myocardial infarction   . Vertigo     PAST SURGICAL HISTORY: Past Surgical History  Procedure Laterality Date  . Abdominal hysterectomy    . Cholecystectomy    . Appendectomy    . Tubal ligation    . Tracheostomy      SOCIAL HISTORY:  reports that she has never smoked. She has never used smokeless tobacco. She reports that she does not drink alcohol or use illicit drugs.  FAMILY HISTORY:  Family History  Problem Relation Age of Onset  . Prostate cancer      CURRENT MEDICATIONS: Reviewed per [facility]  REVIEW OF SYSTEMS:  See HPI otherwise 14 point ROS is negative.  PHYSICAL EXAMINATION  VS:  T 97.6       P 79      RR 19      BP 124/64      POX 98% on trach        WT (Lb)  GENERAL: no acute distress, morbidly obese body habitus EYES: conjunctivae normal, sclerae normal, normal eye lids MOUTH/THROAT: lips without lesions,no lesions in the mouth,tongue is without lesions,uvula elevates in midline NECK: patient has a trach   LYMPHATICS: no LAN in the neck, no supraclavicular LAN RESPIRATORY: breathing  is even & unlabored, BS CTAB CARDIAC: RRR, no murmur,no extra heart sounds EDEMA/VARICOSITIES: left lower extremity edematous and wrapped   GI:  ABDOMEN: abdomen soft, normal BS, no masses, no tenderness  LIVER/SPLEEN: no hepatomegaly, no splenomegaly MUSCULOSKELETAL: HEAD: normal to inspection & palpation BACK: no kyphosis, scoliosis or spinal processes tenderness EXTREMITIES: LEFT UPPER EXTREMITY: full range of motion, normal strength & tone RIGHT UPPER EXTREMITY:  full range of motion, normal strength & tone LEFT LOWER EXTREMITY: strength intact, range of motion minimal   RIGHT LOWER EXTREMITY: strength intact, range of motion minimal   PSYCHIATRIC: the patient is alert & oriented to person, affect & behavior appropriate  LABS/RADIOLOGY: Blood culture normal.    Urine  culture showed no growth.    Potassium 3.4, CO2 37, glucose 105, otherwise BMP normal.    Hemoglobin 9.7, otherwise CBC normal.     In 04/2013:  Depakote level 24.    CMP normal.    Labs reviewed: Basic Metabolic Panel:  Recent Labs  16/10/96 0615  06/01/13 1503 06/02/13 0404 06/03/13 0405  NA 141  < > 140 140 143  K 3.2*  < > 4.4 4.0 3.8  CL 92*  < > 97 96 99  CO2 39*  < > 40* 35* 35*  GLUCOSE 104*  < > 97 93 90  BUN 21  < > 11 15 12   CREATININE 0.81  < > 0.73 1.23* 0.90  CALCIUM 9.3  < > 10.0 10.1 9.9  MG 1.7  --   --  1.6 1.6  PHOS 3.9  --   --  2.7 5.2*  < > = values in this interval not displayed. Liver Function Tests:  Recent Labs  04/30/13 1615 05/16/13 0603 06/01/13 1503  AST 31 23 17   ALT 14 17 14   ALKPHOS 54 79 69  BILITOT 0.3 0.2* 0.2*  PROT 7.5 8.4* 7.2  ALBUMIN 3.7 2.8* 3.1*     Recent Labs  01/23/13 1230  AMMONIA 61*   CBC:  Recent Labs  05/15/13 2320 05/16/13 0603  06/01/13 1503 06/02/13 0404 06/03/13 0405  WBC 15.6* 16.2*  < > 8.6 9.3 7.2  NEUTROABS 11.0* 15.0*  --  5.4  --   --   HGB 10.7* 11.1*  < > 10.3* 9.6* 9.8*  HCT 32.6* 33.7*  < > 31.0* 28.6* 29.2*  MCV 90.3 90.3  < > 89.3 88.0 88.5  PLT 341 350  < > 288 275 258  < > = values in this interval not displayed.  Recent Labs  01/22/13 1931  HDL 73   Cardiac Enzymes:  Recent Labs  05/16/13 0603 05/16/13 1000 05/16/13 1637  TROPONINI <0.30 <0.30 <0.30    CBG:  Recent Labs  06/03/13 0405 06/03/13 0814 06/03/13 1136  GLUCAP 93 89 100*     ASSESSMENT/PLAN:  COPD.  Compensated.  Continue trach support.    CAD.  Stable.    Hypertension.  Well controlled.    Chronic respiratory failure.  Continue trach.    Anemia of chronic disease.  Stable.    Bipolar disorder.  Continue current medications.    I have reviewed patient's medical records received at admission/from hospitalization.  CPT CODE: 04540

## 2013-07-01 ENCOUNTER — Encounter (HOSPITAL_COMMUNITY): Payer: Self-pay | Admitting: Emergency Medicine

## 2013-07-01 ENCOUNTER — Emergency Department (HOSPITAL_COMMUNITY): Payer: Medicaid Other

## 2013-07-01 ENCOUNTER — Emergency Department (HOSPITAL_COMMUNITY)
Admission: EM | Admit: 2013-07-01 | Discharge: 2013-07-02 | Disposition: A | Discharge status: Skilled Nursing Facility | Payer: Medicaid Other | Attending: Emergency Medicine | Admitting: Emergency Medicine

## 2013-07-01 DIAGNOSIS — Z880 Allergy status to penicillin: Secondary | ICD-10-CM | POA: Insufficient documentation

## 2013-07-01 DIAGNOSIS — J9610 Chronic respiratory failure, unspecified whether with hypoxia or hypercapnia: Secondary | ICD-10-CM | POA: Insufficient documentation

## 2013-07-01 DIAGNOSIS — Z872 Personal history of diseases of the skin and subcutaneous tissue: Secondary | ICD-10-CM | POA: Insufficient documentation

## 2013-07-01 DIAGNOSIS — Z79899 Other long term (current) drug therapy: Secondary | ICD-10-CM | POA: Insufficient documentation

## 2013-07-01 DIAGNOSIS — J40 Bronchitis, not specified as acute or chronic: Secondary | ICD-10-CM

## 2013-07-01 DIAGNOSIS — E119 Type 2 diabetes mellitus without complications: Secondary | ICD-10-CM | POA: Insufficient documentation

## 2013-07-01 DIAGNOSIS — G4733 Obstructive sleep apnea (adult) (pediatric): Secondary | ICD-10-CM | POA: Insufficient documentation

## 2013-07-01 DIAGNOSIS — J441 Chronic obstructive pulmonary disease with (acute) exacerbation: Secondary | ICD-10-CM | POA: Insufficient documentation

## 2013-07-01 DIAGNOSIS — I252 Old myocardial infarction: Secondary | ICD-10-CM | POA: Insufficient documentation

## 2013-07-01 DIAGNOSIS — I10 Essential (primary) hypertension: Secondary | ICD-10-CM | POA: Insufficient documentation

## 2013-07-01 DIAGNOSIS — F319 Bipolar disorder, unspecified: Secondary | ICD-10-CM | POA: Insufficient documentation

## 2013-07-01 DIAGNOSIS — R51 Headache: Secondary | ICD-10-CM | POA: Insufficient documentation

## 2013-07-01 DIAGNOSIS — I2510 Atherosclerotic heart disease of native coronary artery without angina pectoris: Secondary | ICD-10-CM | POA: Insufficient documentation

## 2013-07-01 DIAGNOSIS — Z930 Tracheostomy status: Secondary | ICD-10-CM | POA: Insufficient documentation

## 2013-07-01 DIAGNOSIS — IMO0002 Reserved for concepts with insufficient information to code with codable children: Secondary | ICD-10-CM | POA: Insufficient documentation

## 2013-07-01 DIAGNOSIS — Z9981 Dependence on supplemental oxygen: Secondary | ICD-10-CM | POA: Insufficient documentation

## 2013-07-01 DIAGNOSIS — K219 Gastro-esophageal reflux disease without esophagitis: Secondary | ICD-10-CM | POA: Insufficient documentation

## 2013-07-01 DIAGNOSIS — Z792 Long term (current) use of antibiotics: Secondary | ICD-10-CM | POA: Insufficient documentation

## 2013-07-01 MED ORDER — ACETAMINOPHEN 500 MG PO TABS
1000.0000 mg | ORAL_TABLET | Freq: Once | ORAL | Status: AC
Start: 2013-07-01 — End: 2013-07-01
  Administered 2013-07-01: 1000 mg via ORAL
  Filled 2013-07-01: qty 2

## 2013-07-01 NOTE — ED Provider Notes (Signed)
CSN: [id number]     Arrival date & time 07/01/13  2240 History   First MD Initiated Contact with Patient 07/01/13 2254     Chief Complaint  Patient presents with  . Cough  . Shortness of Breath   (Consider location/radiation/quality/duration/timing/severity/associated sxs/prior Treatment) HPI Comments: 62 year old female with a history of a recent tracheostomy placement after she had throat cancer surgery. She states that over the last couple of days she has had increased cough which is productive of green sputum. Staff at the nursing facility at [facility] have been suctioning this phlegm from her trach site. The patient has a feeling of shortness of breath, this is persistent, nothing makes it better or worse, no associated fevers or chills. The patient also complains of a mild headache which is diffuse, generalized, nonfocal and nothing seems to make it better or worse except for coughing which worsens it.  Patient is a 62 y.o. female presenting with cough and shortness of breath. The history is provided by the patient, the EMS personnel and medical records.  Cough Associated symptoms: shortness of breath   Shortness of Breath Associated symptoms: cough     Past Medical History  Diagnosis Date  . COPD   . Hypertension   . Chronic respiratory failure     3 L oxygen  . GERD (gastroesophageal reflux disease)   . Diabetes mellitus   . Obesity, Class III, BMI 40-49.9 (morbid obesity)   . Obstructive sleep apnea on CPAP   . Peripheral vascular disease   . Bipolar disease, chronic   . Lymphedema   . COPD (chronic obstructive pulmonary disease)   . Cellulitis   . Coronary artery disease   . Asthma   . Myocardial infarction   . Vertigo    Past Surgical History  Procedure Laterality Date  . Abdominal hysterectomy    . Cholecystectomy    . Appendectomy    . Tubal ligation    . Tracheostomy     Family History  Problem Relation Age of Onset  . Prostate cancer     History   Substance Use Topics  . Smoking status: Never Smoker   . Smokeless tobacco: Never Used  . Alcohol Use: No   OB History   Grav Para Term Preterm Abortions TAB SAB Ect Mult Living                 Review of Systems  Respiratory: Positive for cough and shortness of breath.   All other systems reviewed and are negative.    Allergies  Penicillins; Aspirin; Bee venom; Lisinopril; Nyquil cold &; and Ppd  Home Medications   Current Outpatient Rx  Name  Route  Sig  Dispense  Refill  . albuterol (PROVENTIL) (2.5 MG/3ML) 0.083% nebulizer solution   Nebulization   Take 3 mLs (2.5 mg total) by nebulization every 4 (four) hours as needed for wheezing.   75 mL   12   . amLODipine (NORVASC) 10 MG tablet   Oral   Take 10 mg by mouth daily.         Marland Kitchen arformoterol (BROVANA) 15 MCG/2ML NEBU   Nebulization   Take 2 mLs (15 mcg total) by nebulization 2 (two) times daily.   120 mL   6   . ARIPiprazole (ABILIFY) 10 MG tablet   Oral   Take 10 mg by mouth daily. For depressive psychosis         . ascorbic acid (VITAMIN C) 500 MG tablet  Oral   Take 500 mg by mouth 2 (two) times daily.         . budesonide-formoterol (SYMBICORT) 160-4.5 MCG/ACT inhaler   Inhalation   Inhale 2 puffs into the lungs 2 (two) times daily.         . busPIRone (BUSPAR) 10 MG tablet   Oral   Take 10 mg by mouth 2 (two) times daily.         . cloNIDine (CATAPRES) 0.2 MG tablet   Oral   Take 0.2 mg by mouth 2 (two) times daily.         . divalproex (DEPAKOTE ER) 250 MG 24 hr tablet   Oral   Take 250 mg by mouth 2 (two) times daily.         . DULoxetine (CYMBALTA) 60 MG capsule   Oral   Take 60 mg by mouth 2 (two) times daily.         . famotidine (PEPCID) 20 MG tablet   Oral   Take 20 mg by mouth 2 (two) times daily.         . fluticasone (FLONASE) 50 MCG/ACT nasal spray   Nasal   Place 2 sprays into the nose daily.         . isosorbide mononitrate (IMDUR) 30 MG 24 hr  tablet   Oral   Take 30 mg by mouth daily.         . nitroGLYCERIN (NITROSTAT) 0.4 MG SL tablet   Sublingual   Place 0.4 mg under the tongue every 5 (five) minutes as needed for chest pain.          Marland Kitchen senna (SENOKOT) 8.6 MG TABS   Oral   Take 2 tablets by mouth daily.         . simethicone (MYLICON) 80 MG chewable tablet   Oral   Chew 80 mg by mouth every 8 (eight) hours.         Marland Kitchen tiotropium (SPIRIVA) 18 MCG inhalation capsule   Inhalation   Place 18 mcg into inhaler and inhale daily.         . vitamin D, CHOLECALCIFEROL, 400 UNITS tablet   Oral   Take 400 Units by mouth daily.         Marland Kitchen levofloxacin (LEVAQUIN) 750 MG tablet   Oral   Take 1 tablet (750 mg total) by mouth daily.   7 tablet   0    BP 108/52  Pulse 86  Temp(Src) 98.6 F (37 C) (Oral)  Resp 17  Ht 5\' 1"  (1.549 m)  Wt 277 lb (125.646 kg)  BMI 52.37 kg/m2  SpO2 99% Physical Exam  Nursing note and vitals reviewed. Constitutional: She appears well-developed and well-nourished. No distress.  HENT:  Head: Normocephalic and atraumatic.  Mouth/Throat: Oropharynx is clear and moist. No oropharyngeal exudate.  Eyes: Conjunctivae and EOM are normal. Pupils are equal, round, and reactive to light. Right eye exhibits no discharge. Left eye exhibits no discharge. No scleral icterus.  Neck: Normal range of motion. Neck supple. No JVD present. No thyromegaly present.  Cardiovascular: Normal rate, regular rhythm, normal heart sounds and intact distal pulses.  Exam reveals no gallop and no friction rub.   No murmur heard. Pulmonary/Chest: Effort normal and breath sounds normal. No respiratory distress. She has no wheezes. She has no rales.  Abdominal: Soft. Bowel sounds are normal. She exhibits no distension and no mass. There is no tenderness.  Musculoskeletal: Normal range of motion.  She exhibits no edema and no tenderness.  Lymphadenopathy:    She has no cervical adenopathy.  Neurological: She is  alert. Coordination normal.  Skin: Skin is warm and dry. No rash noted. No erythema.  Psychiatric: She has a normal mood and affect. Her behavior is normal.    ED Course  Procedures (including critical care time) Labs Review Labs Reviewed - No data to display Imaging Review Dg Chest Port 1 View  07/01/2013   CLINICAL DATA:  Shortness of breath  EXAM: PORTABLE CHEST - 1 VIEW  COMPARISON:  06/02/2013  FINDINGS: Tracheostomy tube remains in place. Shallow inspiration. Heart size and pulmonary vascularity are prominent, suggesting mild persistent congestion. No blunting of costophrenic angles. No pneumothorax. No focal consolidation.  IMPRESSION: Stable appearance of the chest since previous study. Shallow inspiration with mild cardiac enlargement and pulmonary vascular congestion.   Electronically Signed   By: Burman Nieves M.D.   On: 07/01/2013 23:39    EKG Interpretation   None       MDM   1. Bronchitis   2. Headache    The patient is speaking in full sentences, has a normal pulse, temperature and blood pressure. She is having some productive phlegm, the patient is also concerned about her headache equally as much as this cough which seems subacute. We'll perform a chest x-ray and give Tylenol for the headache. SS oxygen on room air.  Chest x-ray is unremarkable, the patient's oxygen level is 99% on 3 L which is what she is on at baseline. We'll treat with Levaquin and have the patient follow up closely. She has not been coughing since arrival, she does not appear in distress, speaks in full sentences and has no increased work of breathing. Patient is in agreement with the plan.   Meds given in ED:  Medications  levofloxacin (LEVAQUIN) tablet 750 mg (not administered)  acetaminophen (TYLENOL) tablet 1,000 mg (1,000 mg Oral Given 07/01/13 2303)    New Prescriptions   LEVOFLOXACIN (LEVAQUIN) 750 MG TABLET    Take 1 tablet (750 mg total) by mouth daily.      Vida Roller,  MD 07/02/13 [phone]

## 2013-07-01 NOTE — ED Notes (Signed)
Patient presents to ED via [facility]. Patient is from [facility]- staff there called stating that patient was c/o of having a cough x 2 days with green sputum and slight "shortness of breath." Per EMS patients lungs clear in all fields. Patient has a trach that was placed approx 1 month ago. EMS placed patient on 8L O2 via NRB mask. VSS. O2 sat 100%. Upon arrival to ED patient is A&Ox4 with no other complaints.

## 2013-07-01 NOTE — ED Notes (Signed)
MD at bedside. 

## 2013-07-02 MED ORDER — LEVOFLOXACIN 750 MG PO TABS: 750.0000 mg | ORAL_TABLET | Freq: Every day | ORAL | Status: DC

## 2013-07-02 MED ORDER — LEVOFLOXACIN 750 MG PO TABS
750.0000 mg | ORAL_TABLET | Freq: Once | ORAL | Status: AC
  Administered 2013-07-02: 750 mg via ORAL
  Filled 2013-07-02: qty 1

## 2013-07-02 NOTE — ED Notes (Signed)
[facility] has been called for transport. 

## 2013-07-03 ENCOUNTER — Emergency Department (HOSPITAL_COMMUNITY)
Admission: EM | Admit: 2013-07-03 | Discharge: 2013-07-03 | Disposition: A | Discharge status: Home / Self Care | Payer: Medicaid Other | Attending: Emergency Medicine | Admitting: Emergency Medicine

## 2013-07-03 ENCOUNTER — Encounter (HOSPITAL_COMMUNITY): Payer: Self-pay | Admitting: Emergency Medicine

## 2013-07-03 ENCOUNTER — Emergency Department (HOSPITAL_COMMUNITY): Payer: Medicaid Other

## 2013-07-03 DIAGNOSIS — Z792 Long term (current) use of antibiotics: Secondary | ICD-10-CM | POA: Insufficient documentation

## 2013-07-03 DIAGNOSIS — Z872 Personal history of diseases of the skin and subcutaneous tissue: Secondary | ICD-10-CM | POA: Insufficient documentation

## 2013-07-03 DIAGNOSIS — Z79899 Other long term (current) drug therapy: Secondary | ICD-10-CM | POA: Insufficient documentation

## 2013-07-03 DIAGNOSIS — I252 Old myocardial infarction: Secondary | ICD-10-CM | POA: Insufficient documentation

## 2013-07-03 DIAGNOSIS — J9610 Chronic respiratory failure, unspecified whether with hypoxia or hypercapnia: Secondary | ICD-10-CM | POA: Insufficient documentation

## 2013-07-03 DIAGNOSIS — Z9981 Dependence on supplemental oxygen: Secondary | ICD-10-CM | POA: Insufficient documentation

## 2013-07-03 DIAGNOSIS — R509 Fever, unspecified: Secondary | ICD-10-CM | POA: Insufficient documentation

## 2013-07-03 DIAGNOSIS — Z880 Allergy status to penicillin: Secondary | ICD-10-CM | POA: Insufficient documentation

## 2013-07-03 DIAGNOSIS — J441 Chronic obstructive pulmonary disease with (acute) exacerbation: Secondary | ICD-10-CM | POA: Insufficient documentation

## 2013-07-03 DIAGNOSIS — I10 Essential (primary) hypertension: Secondary | ICD-10-CM | POA: Insufficient documentation

## 2013-07-03 DIAGNOSIS — E119 Type 2 diabetes mellitus without complications: Secondary | ICD-10-CM | POA: Insufficient documentation

## 2013-07-03 DIAGNOSIS — L97409 Non-pressure chronic ulcer of unspecified heel and midfoot with unspecified severity: Secondary | ICD-10-CM | POA: Insufficient documentation

## 2013-07-03 DIAGNOSIS — G4733 Obstructive sleep apnea (adult) (pediatric): Secondary | ICD-10-CM | POA: Insufficient documentation

## 2013-07-03 DIAGNOSIS — F319 Bipolar disorder, unspecified: Secondary | ICD-10-CM | POA: Insufficient documentation

## 2013-07-03 DIAGNOSIS — K219 Gastro-esophageal reflux disease without esophagitis: Secondary | ICD-10-CM | POA: Insufficient documentation

## 2013-07-03 DIAGNOSIS — Z993 Dependence on wheelchair: Secondary | ICD-10-CM | POA: Insufficient documentation

## 2013-07-03 DIAGNOSIS — IMO0002 Reserved for concepts with insufficient information to code with codable children: Secondary | ICD-10-CM | POA: Insufficient documentation

## 2013-07-03 DIAGNOSIS — J209 Acute bronchitis, unspecified: Secondary | ICD-10-CM

## 2013-07-03 DIAGNOSIS — Z930 Tracheostomy status: Secondary | ICD-10-CM | POA: Insufficient documentation

## 2013-07-03 LAB — CBC WITH DIFFERENTIAL/PLATELET
Basophils Absolute: 0 10*3/uL (ref 0.0–0.1)
Basophils Relative: 0 % (ref 0–1)
Eosinophils Absolute: 0.1 10*3/uL (ref 0.0–0.7)
Eosinophils Relative: 1 % (ref 0–5)
HCT: 32.5 % — ABNORMAL LOW (ref 36.0–46.0)
Lymphocytes Relative: 22 % (ref 12–46)
MCH: 28.4 pg (ref 26.0–34.0)
MCHC: 32.3 g/dL (ref 30.0–36.0)
MCV: 87.8 fL (ref 78.0–100.0)
Monocytes Absolute: 0.6 10*3/uL (ref 0.1–1.0)
Neutro Abs: 7 10*3/uL (ref 1.7–7.7)
RDW: 14.4 % (ref 11.5–15.5)
WBC: 9.9 10*3/uL (ref 4.0–10.5)

## 2013-07-03 LAB — POCT I-STAT, CHEM 8
BUN: 14 mg/dL (ref 6–23)
Creatinine, Ser: 1 mg/dL (ref 0.50–1.10)
Glucose, Bld: 99 mg/dL (ref 70–99)
Hemoglobin: 11.6 g/dL — ABNORMAL LOW (ref 12.0–15.0)
TCO2: 43 mmol/L (ref 0–100)

## 2013-07-03 LAB — TROPONIN I: Troponin I: <0.3 ng/mL (ref <0.30)

## 2013-07-03 MED ORDER — ALBUTEROL SULFATE (2.5 MG/3ML) 0.083% IN NEBU
5.0000 mg | INHALATION_SOLUTION | Freq: Once | RESPIRATORY_TRACT | Status: AC
  Administered 2013-07-03: 5 mg via RESPIRATORY_TRACT
  Filled 2013-07-03: qty 6

## 2013-07-03 NOTE — ED Notes (Signed)
[facility] - NOTIFIED BY SECRETARY FOR TRANSPORT.

## 2013-07-03 NOTE — ED Provider Notes (Signed)
CSN: [id number]     Arrival date & time 07/03/13  1735 History   First MD Initiated Contact with Patient 07/03/13 1736     Chief Complaint  Patient presents with  . Shortness of Breath   (Consider location/radiation/quality/duration/timing/severity/associated sxs/prior Treatment) HPI Presents with cough and shortness of breath onset 2 or 3 days ago. Patient reports she was treated with suctioning her tracheostomy up 5 or 6 times today with transient improvement. She did not feel as if she needs to be suctioned presently. She was seen here 2 days ago for same complaint. Started on Levaquin. No relief. Nothing makes symptoms better or worse. Other associated symptoms include subjective fever. She denies nausea or vomiting. No other complaint. Past Medical History  Diagnosis Date  . COPD   . Hypertension   . Chronic respiratory failure     3 L oxygen  . GERD (gastroesophageal reflux disease)   . Diabetes mellitus   . Obesity, Class III, BMI 40-49.9 (morbid obesity)   . Obstructive sleep apnea on CPAP   . Peripheral vascular disease   . Bipolar disease, chronic   . Lymphedema   . COPD (chronic obstructive pulmonary disease)   . Cellulitis   . Coronary artery disease   . Asthma   . Myocardial infarction   . Vertigo    Past Surgical History  Procedure Laterality Date  . Abdominal hysterectomy    . Cholecystectomy    . Appendectomy    . Tubal ligation    . Tracheostomy     Family History  Problem Relation Age of Onset  . Prostate cancer     History  Substance Use Topics  . Smoking status: Never Smoker   . Smokeless tobacco: Never Used  . Alcohol Use: No   OB History   Grav Para Term Preterm Abortions TAB SAB Ect Mult Living                 Review of Systems  Constitutional: Negative.   HENT: Negative.   Respiratory: Positive for cough and shortness of breath.        Chronic dyspnea  Cardiovascular: Negative.   Gastrointestinal: Negative.   Musculoskeletal:  Positive for gait problem.       Wheelchair-bound  Skin: Positive for wound.       Ulcer on left leg. Left leg in Unna boot  Neurological: Negative.   Psychiatric/Behavioral: Negative.   All other systems reviewed and are negative.    Allergies  Penicillins; Aspirin; Bee venom; Lisinopril; Nyquil cold &; and Ppd  Home Medications   Current Outpatient Rx  Name  Route  Sig  Dispense  Refill  . albuterol (PROVENTIL) (2.5 MG/3ML) 0.083% nebulizer solution   Nebulization   Take 3 mLs (2.5 mg total) by nebulization every 4 (four) hours as needed for wheezing.   75 mL   12   . amLODipine (NORVASC) 10 MG tablet   Oral   Take 10 mg by mouth daily.         Marland Kitchen arformoterol (BROVANA) 15 MCG/2ML NEBU   Nebulization   Take 2 mLs (15 mcg total) by nebulization 2 (two) times daily.   120 mL   6   . ARIPiprazole (ABILIFY) 10 MG tablet   Oral   Take 10 mg by mouth daily. For depressive psychosis         . ascorbic acid (VITAMIN C) 500 MG tablet   Oral   Take 500 mg by mouth 2 (two)  times daily.         . budesonide-formoterol (SYMBICORT) 160-4.5 MCG/ACT inhaler   Inhalation   Inhale 2 puffs into the lungs 2 (two) times daily.         . busPIRone (BUSPAR) 10 MG tablet   Oral   Take 10 mg by mouth 2 (two) times daily.         . cloNIDine (CATAPRES) 0.2 MG tablet   Oral   Take 0.2 mg by mouth 2 (two) times daily.         . divalproex (DEPAKOTE ER) 250 MG 24 hr tablet   Oral   Take 250 mg by mouth 2 (two) times daily.         . DULoxetine (CYMBALTA) 60 MG capsule   Oral   Take 60 mg by mouth 2 (two) times daily.         . famotidine (PEPCID) 20 MG tablet   Oral   Take 20 mg by mouth 2 (two) times daily.         . fluticasone (FLONASE) 50 MCG/ACT nasal spray   Nasal   Place 2 sprays into the nose daily.         . isosorbide mononitrate (IMDUR) 30 MG 24 hr tablet   Oral   Take 30 mg by mouth daily.         Marland Kitchen levofloxacin (LEVAQUIN) 750 MG tablet    Oral   Take 1 tablet (750 mg total) by mouth daily.   7 tablet   0   . nitroGLYCERIN (NITROSTAT) 0.4 MG SL tablet   Sublingual   Place 0.4 mg under the tongue every 5 (five) minutes as needed for chest pain.          Marland Kitchen senna (SENOKOT) 8.6 MG TABS   Oral   Take 2 tablets by mouth daily.         . simethicone (MYLICON) 80 MG chewable tablet   Oral   Chew 80 mg by mouth every 8 (eight) hours.         Marland Kitchen tiotropium (SPIRIVA) 18 MCG inhalation capsule   Inhalation   Place 18 mcg into inhaler and inhale daily.         . vitamin D, CHOLECALCIFEROL, 400 UNITS tablet   Oral   Take 400 Units by mouth daily.          BP 141/60  Temp(Src) 98.4 F (36.9 C) (Oral)  Resp 30  Ht 5\' 4"  (1.626 m)  Wt 271 lb (122.925 kg)  BMI 46.49 kg/m2  SpO2 90% Physical Exam  Nursing note and vitals reviewed. Constitutional: She is oriented to person, place, and time. No distress.  Chronically ill-appearing  HENT:  Head: Normocephalic and atraumatic.  Eyes: Conjunctivae are normal. Pupils are equal, round, and reactive to light.  Neck: Neck supple. No thyromegaly present.  Tracheostomy in place  Cardiovascular: Normal rate and regular rhythm.   No murmur heard. Pulmonary/Chest: Effort normal. No respiratory distress.  Prolonged expiratory phase with extra wheezes  Abdominal: Soft. Bowel sounds are normal. She exhibits no distension. There is no tenderness.  Morbidly obese  Musculoskeletal: Normal range of motion. She exhibits no edema and no tenderness.  Left lower extremity in the una BOOT. Other extremities without edema  Neurological: She is alert and oriented to person, place, and time. No cranial nerve deficit. Coordination normal.  Skin: Skin is warm and dry. No rash noted.  Psychiatric: She has a normal mood and  affect.    ED Course  Procedures (including critical care time) Labs Review Labs Reviewed  CBC WITH DIFFERENTIAL  PRO B NATRIURETIC PEPTIDE   Imaging Review Dg  Chest Port 1 View  07/01/2013   CLINICAL DATA:  Shortness of breath  EXAM: PORTABLE CHEST - 1 VIEW  COMPARISON:  06/02/2013  FINDINGS: Tracheostomy tube remains in place. Shallow inspiration. Heart size and pulmonary vascularity are prominent, suggesting mild persistent congestion. No blunting of costophrenic angles. No pneumothorax. No focal consolidation.  IMPRESSION: Stable appearance of the chest since previous study. Shallow inspiration with mild cardiac enlargement and pulmonary vascular congestion.   Electronically Signed   By: Burman Nieves M.D.   On: 07/01/2013 23:39    EKG Interpretation    Date/Time:  Wednesday July 03 2013 18:28:26 EST Ventricular Rate:  85 PR Interval:  165 QRS Duration: 74 QT Interval:  380 QTC Calculation: 452 R Axis:     Text Interpretation:  Sinus rhythm No significant change since last tracing Since last tracing rate slower Confirmed by Ethelda Chick  MD, Donyell Ding ([id number]) on 07/03/2013 7:56:02 PM          chest xray viewed by me Results for orders placed during the hospital encounter of 07/03/13  CBC WITH DIFFERENTIAL      Result Value Range   WBC 9.9  4.0 - 10.5 K/uL   RBC 3.70 (*) 3.87 - 5.11 MIL/uL   Hemoglobin 10.5 (*) 12.0 - 15.0 g/dL   HCT 16.1 (*) 09.6 - 04.5 %   MCV 87.8  78.0 - 100.0 fL   MCH 28.4  26.0 - 34.0 pg   MCHC 32.3  30.0 - 36.0 g/dL   RDW 40.9  81.1 - 91.4 %   Platelets 264  150 - 400 K/uL   Neutrophils Relative % 71  43 - 77 %   Neutro Abs 7.0  1.7 - 7.7 K/uL   Lymphocytes Relative 22  12 - 46 %   Lymphs Abs 2.1  0.7 - 4.0 K/uL   Monocytes Relative 6  3 - 12 %   Monocytes Absolute 0.6  0.1 - 1.0 K/uL   Eosinophils Relative 1  0 - 5 %   Eosinophils Absolute 0.1  0.0 - 0.7 K/uL   Basophils Relative 0  0 - 1 %   Basophils Absolute 0.0  0.0 - 0.1 K/uL  PRO B NATRIURETIC PEPTIDE      Result Value Range   Pro B Natriuretic peptide (BNP) 237.0 (*) 0 - 125 pg/mL  TROPONIN I      Result Value Range   Troponin I <0.30  <0.30  ng/mL  POCT I-STAT, CHEM 8      Result Value Range   Sodium 139  137 - 147 mEq/L   Potassium 4.3  3.7 - 5.3 mEq/L   Chloride 89 (*) 96 - 112 mEq/L   BUN 14  6 - 23 mg/dL   Creatinine, Ser 7.82  0.50 - 1.10 mg/dL   Glucose, Bld 99  70 - 99 mg/dL   Calcium, Ion 9.56  2.13 - 1.30 mmol/L   TCO2 43  0 - 100 mmol/L   Hemoglobin 11.6 (*) 12.0 - 15.0 g/dL   HCT 08.6 (*) 57.8 - 46.9 %   Dg Chest 2 View  07/03/2013   CLINICAL DATA:  Short of breath, cough  EXAM: CHEST  2 VIEW  COMPARISON:  DG CHEST 1V PORT dated 07/01/2013  FINDINGS: Stable enlarged cardiac silhouette. Lung volumes are  low. There is central venous congestion similar prior. There is a mild pulmonary edema pattern. No pleural fluid evident. No pneumothorax.  IMPRESSION: Cardiomegaly, low lung volumes, and mild pulmonary edema.   Electronically Signed   By: Genevive Bi M.D.   On: 07/03/2013 19:00   Dg Chest Port 1 View  07/01/2013   CLINICAL DATA:  Shortness of breath  EXAM: PORTABLE CHEST - 1 VIEW  COMPARISON:  06/02/2013  FINDINGS: Tracheostomy tube remains in place. Shallow inspiration. Heart size and pulmonary vascularity are prominent, suggesting mild persistent congestion. No blunting of costophrenic angles. No pneumothorax. No focal consolidation.  IMPRESSION: Stable appearance of the chest since previous study. Shallow inspiration with mild cardiac enlargement and pulmonary vascular congestion.   Electronically Signed   By: Burman Nieves M.D.   On: 07/01/2013 23:39   Chest xray viewed by me Patient's breathing is at home improved after albuterol neb she speaks in paragraphs she feels ready to go home MDM  No diagnosis found.  Doubt chf with near normal bnp She is also requesting "sleep medicine and medicine for my nerves". I explained to her that I did not coupled prescribing benzodiazepines as they can interfere with breathing. Albuterol nebulization every 4 hours when necessary wheeze  Diagnosis acute  bronchitis  Doug Sou, MD 07/03/13 2012

## 2013-07-03 NOTE — ED Notes (Signed)
Pt from [facility], c/o sob. Pt has trach mask @ 2 lt, sats at 90% , nsd.

## 2013-07-05 ENCOUNTER — Encounter (HOSPITAL_COMMUNITY): Payer: Self-pay | Admitting: Emergency Medicine

## 2013-07-05 ENCOUNTER — Emergency Department (HOSPITAL_COMMUNITY): Payer: Medicaid Other

## 2013-07-05 ENCOUNTER — Emergency Department (HOSPITAL_COMMUNITY)
Admission: EM | Admit: 2013-07-05 | Discharge: 2013-07-06 | Disposition: A | Discharge status: Home / Self Care | Payer: Medicaid Other | Attending: Emergency Medicine | Admitting: Emergency Medicine

## 2013-07-05 DIAGNOSIS — IMO0002 Reserved for concepts with insufficient information to code with codable children: Secondary | ICD-10-CM | POA: Insufficient documentation

## 2013-07-05 DIAGNOSIS — G4733 Obstructive sleep apnea (adult) (pediatric): Secondary | ICD-10-CM | POA: Insufficient documentation

## 2013-07-05 DIAGNOSIS — J441 Chronic obstructive pulmonary disease with (acute) exacerbation: Secondary | ICD-10-CM | POA: Insufficient documentation

## 2013-07-05 DIAGNOSIS — F319 Bipolar disorder, unspecified: Secondary | ICD-10-CM | POA: Insufficient documentation

## 2013-07-05 DIAGNOSIS — I2510 Atherosclerotic heart disease of native coronary artery without angina pectoris: Secondary | ICD-10-CM | POA: Insufficient documentation

## 2013-07-05 DIAGNOSIS — K219 Gastro-esophageal reflux disease without esophagitis: Secondary | ICD-10-CM | POA: Insufficient documentation

## 2013-07-05 DIAGNOSIS — Z880 Allergy status to penicillin: Secondary | ICD-10-CM | POA: Insufficient documentation

## 2013-07-05 DIAGNOSIS — E119 Type 2 diabetes mellitus without complications: Secondary | ICD-10-CM | POA: Insufficient documentation

## 2013-07-05 DIAGNOSIS — J45901 Unspecified asthma with (acute) exacerbation: Principal | ICD-10-CM

## 2013-07-05 DIAGNOSIS — Z9889 Other specified postprocedural states: Secondary | ICD-10-CM | POA: Insufficient documentation

## 2013-07-05 DIAGNOSIS — I252 Old myocardial infarction: Secondary | ICD-10-CM | POA: Insufficient documentation

## 2013-07-05 DIAGNOSIS — J40 Bronchitis, not specified as acute or chronic: Secondary | ICD-10-CM

## 2013-07-05 DIAGNOSIS — Z872 Personal history of diseases of the skin and subcutaneous tissue: Secondary | ICD-10-CM | POA: Insufficient documentation

## 2013-07-05 DIAGNOSIS — Z79899 Other long term (current) drug therapy: Secondary | ICD-10-CM | POA: Insufficient documentation

## 2013-07-05 DIAGNOSIS — R059 Cough, unspecified: Secondary | ICD-10-CM

## 2013-07-05 DIAGNOSIS — R05 Cough: Secondary | ICD-10-CM

## 2013-07-05 DIAGNOSIS — I10 Essential (primary) hypertension: Secondary | ICD-10-CM | POA: Insufficient documentation

## 2013-07-05 LAB — CBC
HCT: 33.4 % — ABNORMAL LOW (ref 36.0–46.0)
HEMOGLOBIN: 10.6 g/dL — AB (ref 12.0–15.0)
MCH: 28.1 pg (ref 26.0–34.0)
MCHC: 31.7 g/dL (ref 30.0–36.0)
MCV: 88.6 fL (ref 78.0–100.0)
Platelets: 233 10*3/uL (ref 150–400)
RBC: 3.77 MIL/uL — ABNORMAL LOW (ref 3.87–5.11)
RDW: 14.6 % (ref 11.5–15.5)
WBC: 7.7 10*3/uL (ref 4.0–10.5)

## 2013-07-05 LAB — BASIC METABOLIC PANEL
BUN: 11 mg/dL (ref 6–23)
CHLORIDE: 91 meq/L — AB (ref 96–112)
CO2: 44 mEq/L (ref 19–32)
Calcium: 9.6 mg/dL (ref 8.4–10.5)
Creatinine, Ser: 0.78 mg/dL (ref 0.50–1.10)
GFR calc non Af Amer: 87 mL/min — ABNORMAL LOW (ref >90)
Glucose, Bld: 101 mg/dL — ABNORMAL HIGH (ref 70–99)
POTASSIUM: 4.3 meq/L (ref 3.7–5.3)
Sodium: 140 mEq/L (ref 137–147)

## 2013-07-05 LAB — D-DIMER, QUANTITATIVE (NOT AT ARMC): D DIMER QUANT: 0.68 ug{FEU}/mL — AB (ref 0.00–0.48)

## 2013-07-05 LAB — POCT I-STAT TROPONIN I: Troponin i, poc: 0.02 ng/mL (ref 0.00–0.08)

## 2013-07-05 NOTE — ED Provider Notes (Signed)
CSN: [id number]     Arrival date & time 07/05/13  2029 History   First MD Initiated Contact with Patient 07/05/13 2033     Chief Complaint  Patient presents with  . Chest Pain  . Shortness of Breath   HPI  63 y/o female with history of COPD, HTN, Chronic respiratory failure, DM, Morbid obesity who presents with cc of shortness of breath and chest pain. States her symptoms have been present for the past 2-3 days. She was seen here two days ago for SOB. She was diagnosed with bronchitis. She states that for the past two days she has had right sided chest pain. The patient states the pain is sharp and worsens with coughing and deep breaths. Her pain is currently a 8/10. The pain radiates to her back.   Past Medical History  Diagnosis Date  . COPD   . Hypertension   . Chronic respiratory failure     3 L oxygen  . GERD (gastroesophageal reflux disease)   . Diabetes mellitus   . Obesity, Class III, BMI 40-49.9 (morbid obesity)   . Obstructive sleep apnea on CPAP   . Peripheral vascular disease   . Bipolar disease, chronic   . Lymphedema   . COPD (chronic obstructive pulmonary disease)   . Cellulitis   . Coronary artery disease   . Asthma   . Myocardial infarction   . Vertigo    Past Surgical History  Procedure Laterality Date  . Abdominal hysterectomy    . Cholecystectomy    . Appendectomy    . Tubal ligation    . Tracheostomy     Family History  Problem Relation Age of Onset  . Prostate cancer     History  Substance Use Topics  . Smoking status: Never Smoker   . Smokeless tobacco: Never Used  . Alcohol Use: No   OB History   Grav Para Term Preterm Abortions TAB SAB Ect Mult Living                 Review of Systems  Constitutional: Negative for fever and chills.  Respiratory: Positive for cough, chest tightness and shortness of breath.   Cardiovascular: Positive for chest pain.  Gastrointestinal: Negative for nausea, vomiting and abdominal pain.  Genitourinary:  Negative for dysuria and frequency.  Musculoskeletal: Negative for back pain.  Skin: Positive for wound (ulcer on left leg in boot).  All other systems reviewed and are negative.   Allergies  Penicillins; Ace inhibitors; Aspirin; Bee venom; Lisinopril; Nyquil cold &; and Ppd  Home Medications   Current Outpatient Rx  Name  Route  Sig  Dispense  Refill  . amLODipine (NORVASC) 10 MG tablet   Oral   Take 10 mg by mouth daily.         Marland Kitchen arformoterol (BROVANA) 15 MCG/2ML NEBU   Nebulization   Take 2 mLs (15 mcg total) by nebulization 2 (two) times daily.   120 mL   6   . ARIPiprazole (ABILIFY) 10 MG tablet   Oral   Take 10 mg by mouth daily. For depressive psychosis         . ascorbic acid (VITAMIN C) 500 MG tablet   Oral   Take 500 mg by mouth 2 (two) times daily.         . budesonide-formoterol (SYMBICORT) 160-4.5 MCG/ACT inhaler   Inhalation   Inhale 2 puffs into the lungs 2 (two) times daily.         Marland Kitchen  busPIRone (BUSPAR) 10 MG tablet   Oral   Take 10 mg by mouth 2 (two) times daily.         . cloNIDine (CATAPRES) 0.2 MG tablet   Oral   Take 0.2 mg by mouth 2 (two) times daily.         . divalproex (DEPAKOTE ER) 250 MG 24 hr tablet   Oral   Take 250 mg by mouth 2 (two) times daily.         . DULoxetine (CYMBALTA) 60 MG capsule   Oral   Take 60 mg by mouth 2 (two) times daily.         . famotidine (PEPCID) 20 MG tablet   Oral   Take 20 mg by mouth 2 (two) times daily.         . fluticasone (FLONASE) 50 MCG/ACT nasal spray   Nasal   Place 2 sprays into the nose daily.         Marland Kitchen. senna (SENOKOT) 8.6 MG TABS   Oral   Take 2 tablets by mouth daily.         . simethicone (MYLICON) 80 MG chewable tablet   Oral   Chew 80 mg by mouth every 8 (eight) hours.         Marland Kitchen. tiotropium (SPIRIVA) 18 MCG inhalation capsule   Inhalation   Place 18 mcg into inhaler and inhale daily.         . vitamin D, CHOLECALCIFEROL, 400 UNITS tablet    Oral   Take 400 Units by mouth daily.         Marland Kitchen. albuterol (PROVENTIL) (2.5 MG/3ML) 0.083% nebulizer solution   Nebulization   Take 3 mLs (2.5 mg total) by nebulization every 4 (four) hours as needed for wheezing.   75 mL   12   . nitroGLYCERIN (NITROSTAT) 0.4 MG SL tablet   Sublingual   Place 0.4 mg under the tongue every 5 (five) minutes as needed for chest pain.           BP 138/94  Pulse 87  Temp(Src) 98.8 F (37.1 C) (Oral)  Resp 18  SpO2 96% Physical Exam  Constitutional: She is oriented to person, place, and time. She appears well-developed. No distress.  obese  HENT:  Head: Normocephalic and atraumatic.  Eyes: Conjunctivae are normal. Pupils are equal, round, and reactive to light.  Neck: Normal range of motion. Neck supple.  Tracheostomy in place  Cardiovascular: Normal rate and normal heart sounds.  Exam reveals no gallop and no friction rub.   No murmur heard. Pulmonary/Chest: Effort normal and breath sounds normal.  Abdominal: Soft. Bowel sounds are normal. She exhibits no distension. There is no tenderness.  Musculoskeletal: Normal range of motion. She exhibits no edema and no tenderness.  Neurological: She is alert and oriented to person, place, and time.  Skin: Skin is warm and dry.  Left leg in boot  Psychiatric: She has a normal mood and affect.   ED Course  Procedures (including critical care time) Labs Review Labs Reviewed  CBC - Abnormal; Notable for the following:    RBC 3.77 (*)    Hemoglobin 10.6 (*)    HCT 33.4 (*)    All other components within normal limits  BASIC METABOLIC PANEL - Abnormal; Notable for the following:    Chloride 91 (*)    CO2 44 (*)    Glucose, Bld 101 (*)    GFR calc non Af Amer 87 (*)  All other components within normal limits  D-DIMER, QUANTITATIVE - Abnormal; Notable for the following:    D-Dimer, Quant 0.68 (*)    All other components within normal limits  POCT I-STAT TROPONIN I  POCT I-STAT TROPONIN I    Imaging Review No results found.  Date: 07/06/2013  Rate: 88  Rhythm: normal sinus rhythm  QRS Axis: normal  Intervals: normal  ST/T Wave abnormalities: normal  Conduction Disutrbances:none  Narrative Interpretation:   Old EKG Reviewed: unchanged   MDM   Here with chest pain in setting of recent diagnosis of bronchitis. EKG wihtout acute changes. Pain is atypical for ACS and delta troponin is negative so doubt ACS. Low risk for PE by wells. D-dimer positive. Will obtain CT PE study to evaluate for DVT. If negative, likely MSK pain from cough. Transfer of care to Dr. Norlene Campbell at 00:00.   1. Cough   2. Bronchitis         Shanon Ace, MD 07/08/13 1011

## 2013-07-05 NOTE — ED Notes (Signed)
Pt successfully suctioned.

## 2013-07-05 NOTE — ED Notes (Signed)
Report per EMS. Called out for chest pain. Pt has been having chest pain x 2 days with SOB. Pt also reports worse SOB than normal for her x 1 month and a productive cough x 1 week. Pt states that her pain is worse when she coughs or takes a deep breath. Pt with trach, respiratory therapy at bedside. Last vitals for EMS: BP- 134/82, Resp- 32, O2- 98% on 8L, HR- 88. [facility]

## 2013-07-06 ENCOUNTER — Encounter (HOSPITAL_COMMUNITY): Payer: Self-pay | Admitting: Radiology

## 2013-07-06 ENCOUNTER — Emergency Department (HOSPITAL_COMMUNITY): Payer: Medicaid Other

## 2013-07-06 LAB — POCT I-STAT TROPONIN I: Troponin i, poc: 0.01 ng/mL (ref 0.00–0.08)

## 2013-07-06 MED ORDER — IOHEXOL 350 MG/ML SOLN
80.0000 mL | Freq: Once | INTRAVENOUS | Status: AC | PRN
  Administered 2013-07-06: 67 mL via INTRAVENOUS

## 2013-07-06 NOTE — Discharge Instructions (Signed)
Your CT scan today did not show any blood clots or other specific cause for your symptoms.  Please follow up with your doctor.  Take medications as prescribed.   Bronchitis Bronchitis is the body's way of reacting to injury and/or infection (inflammation) of the bronchi. Bronchi are the air tubes that extend from the windpipe into the lungs. If the inflammation becomes severe, it may cause shortness of breath. CAUSES  Inflammation may be caused by:  A virus.  Germs (bacteria).  Dust.  Allergens.  Pollutants and many other irritants. The cells lining the bronchial tree are covered with tiny hairs (cilia). These constantly beat upward, away from the lungs, toward the mouth. This keeps the lungs free of pollutants. When these cells become too irritated and are unable to do their job, mucus begins to develop. This causes the characteristic cough of bronchitis. The cough clears the lungs when the cilia are unable to do their job. Without either of these protective mechanisms, the mucus would settle in the lungs. Then you would develop pneumonia. Smoking is a common cause of bronchitis and can contribute to pneumonia. Stopping this habit is the single most important thing you can do to help yourself. TREATMENT   Your caregiver may prescribe an antibiotic if the cough is caused by bacteria. Also, medicines that open up your airways make it easier to breathe. Your caregiver may also recommend or prescribe an expectorant. It will loosen the mucus to be coughed up. Only take over-the-counter or prescription medicines for pain, discomfort, or fever as directed by your caregiver.  Removing whatever causes the problem (smoking, for example) is critical to preventing the problem from getting worse.  Cough suppressants may be prescribed for relief of cough symptoms.  Inhaled medicines may be prescribed to help with symptoms now and to help prevent problems from returning.  For those with recurrent  (chronic) bronchitis, there may be a need for steroid medicines. SEEK IMMEDIATE MEDICAL CARE IF:   During treatment, you develop more pus-like mucus (purulent sputum).  You have a fever.  You become progressively more ill.  You have increased difficulty breathing, wheezing, or shortness of breath. It is necessary to seek immediate medical care if you are elderly or sick from any other disease. MAKE SURE YOU:   Understand these instructions.  Will watch your condition.  Will get help right away if you are not doing well or get worse. Document Released: 06/20/2005 Document Revised: 02/20/2013 Document Reviewed: 02/12/2013 [facility] Patient Information 2014 [place].  Cough, Adult  A cough is a reflex that helps clear your throat and airways. It can help heal the body or may be a reaction to an irritated airway. A cough may only last 2 or 3 weeks (acute) or may last more than 8 weeks (chronic).  CAUSES Acute cough:  Viral or bacterial infections. Chronic cough:  Infections.  Allergies.  Asthma.  Post-nasal drip.  Smoking.  Heartburn or acid reflux.  Some medicines.  Chronic lung problems (COPD).  Cancer. SYMPTOMS   Cough.  Fever.  Chest pain.  Increased breathing rate.  High-pitched whistling sound when breathing (wheezing).  Colored mucus that you cough up (sputum). TREATMENT   A bacterial cough may be treated with antibiotic medicine.  A viral cough must run its course and will not respond to antibiotics.  Your caregiver may recommend other treatments if you have a chronic cough. HOME CARE INSTRUCTIONS   Only take over-the-counter or prescription medicines for pain, discomfort, or fever  as directed by your caregiver. Use cough suppressants only as directed by your caregiver.  Use a cold steam vaporizer or humidifier in your bedroom or home to help loosen secretions.  Sleep in a semi-upright position if your cough is worse at night.  Rest  as needed.  Stop smoking if you smoke. SEEK IMMEDIATE MEDICAL CARE IF:   You have pus in your sputum.  Your cough starts to worsen.  You cannot control your cough with suppressants and are losing sleep.  You begin coughing up blood.  You have difficulty breathing.  You develop pain which is getting worse or is uncontrolled with medicine.  You have a fever. MAKE SURE YOU:   Understand these instructions.  Will watch your condition.  Will get help right away if you are not doing well or get worse. Document Released: 12/17/2010 Document Revised: 09/12/2011 Document Reviewed: 12/17/2010 [facility] Patient Information 2014 [place], [place].

## 2013-07-06 NOTE — ED Provider Notes (Signed)
Care assumed from prior team awaiting CTA chest.  Pt with two days of chest pain, seen recently in the ED for sob, cough, dx with bronchitis.  Two negative troponins.    Results for orders placed during the hospital encounter of 07/05/13  CBC      Result Value Range   WBC 7.7  4.0 - 10.5 K/uL   RBC 3.77 (*) 3.87 - 5.11 MIL/uL   Hemoglobin 10.6 (*) 12.0 - 15.0 g/dL   HCT 16.1 (*) 09.6 - 04.5 %   MCV 88.6  78.0 - 100.0 fL   MCH 28.1  26.0 - 34.0 pg   MCHC 31.7  30.0 - 36.0 g/dL   RDW 40.9  81.1 - 91.4 %   Platelets 233  150 - 400 K/uL  BASIC METABOLIC PANEL      Result Value Range   Sodium 140  137 - 147 mEq/L   Potassium 4.3  3.7 - 5.3 mEq/L   Chloride 91 (*) 96 - 112 mEq/L   CO2 44 (*) 19 - 32 mEq/L   Glucose, Bld 101 (*) 70 - 99 mg/dL   BUN 11  6 - 23 mg/dL   Creatinine, Ser 7.82  0.50 - 1.10 mg/dL   Calcium 9.6  8.4 - 95.6 mg/dL   GFR calc non Af Amer 87 (*) >90 mL/min   GFR calc Af Amer >90  >90 mL/min  D-DIMER, QUANTITATIVE      Result Value Range   D-Dimer, Quant 0.68 (*) 0.00 - 0.48 ug/mL-FEU  POCT I-STAT TROPONIN I      Result Value Range   Troponin i, poc 0.02  0.00 - 0.08 ng/mL   Comment 3           POCT I-STAT TROPONIN I      Result Value Range   Troponin i, poc 0.01  0.00 - 0.08 ng/mL   Comment 3            Dg Chest 2 View  07/03/2013   CLINICAL DATA:  Short of breath, cough  EXAM: CHEST  2 VIEW  COMPARISON:  DG CHEST 1V PORT dated 07/01/2013  FINDINGS: Stable enlarged cardiac silhouette. Lung volumes are low. There is central venous congestion similar prior. There is a mild pulmonary edema pattern. No pleural fluid evident. No pneumothorax.  IMPRESSION: Cardiomegaly, low lung volumes, and mild pulmonary edema.   Electronically Signed   By: Genevive Bi M.D.   On: 07/03/2013 19:00   Ct Angio Chest W/cm &/or Wo Cm  07/06/2013   CLINICAL DATA:  Chest pain and shortness of Breath.  EXAM: CT ANGIOGRAPHY CHEST WITH CONTRAST  TECHNIQUE: Multidetector CT imaging of  the chest was performed using the standard protocol during bolus administration of intravenous contrast. Multiplanar CT image reconstructions including MIPs were obtained to evaluate the vascular anatomy.  CONTRAST:  67mL OMNIPAQUE IOHEXOL 350 MG/ML SOLN  COMPARISON:  10/06/2009  FINDINGS: Examination of the chest wall demonstrates tracheostomy tube in place. Persistent bulky left axillary and subpectoral adenopathy. Small supraclavicular lymph nodes noted bilaterally the bony thorax is intact. No significant bony abnormalities.  The heart is enlarged but stable. No pericardial effusion. Small amount of fluid in the pericardial recesses. The aorta is normal in caliber. No dissection. The esophagus is grossly normal. The pulmonary arterial tree is fairly well opacified. No filling defects to suggest pulmonary emboli.  The lungs demonstrate mild central vascular congestion but no edema, infiltrates or effusions.  The upper abdomen is  grossly normal.  Review of the MIP images confirms the above findings.  IMPRESSION: No CT findings for pulmonary embolism.  No significant pulmonary findings.  Stable cardiac enlargement.  Stable bulky left axillary and subpectoral adenopathy.   Electronically Signed   By: Loralie ChampagneMark  Gallerani M.D.   On: 07/06/2013 02:00   Dg Chest Port 1 View  07/05/2013   CLINICAL DATA:  Chest pain, cough fever.  EXAM: PORTABLE CHEST - 1 VIEW  COMPARISON:  07/03/2013.  FINDINGS: The heart is enlarged but stable. The mediastinal and hilar contours are prominent but unchanged. A tracheostomy tube is stable. Low lung volumes with vascular crowding and atelectasis. No definite infiltrates or effusions. Stable eventration of the right hemidiaphragm.  IMPRESSION: Stable cardiac enlargement.  No acute pulmonary findings.   Electronically Signed   By: Loralie ChampagneMark  Gallerani M.D.   On: 07/05/2013 22:06   Dg Chest Port 1 View  07/01/2013   CLINICAL DATA:  Shortness of breath  EXAM: PORTABLE CHEST - 1 VIEW  COMPARISON:   06/02/2013  FINDINGS: Tracheostomy tube remains in place. Shallow inspiration. Heart size and pulmonary vascularity are prominent, suggesting mild persistent congestion. No blunting of costophrenic angles. No pneumothorax. No focal consolidation.  IMPRESSION: Stable appearance of the chest since previous study. Shallow inspiration with mild cardiac enlargement and pulmonary vascular congestion.   Electronically Signed   By: Burman NievesWilliam  Stevens M.D.   On: 07/01/2013 23:39      Alice Mackielga M Alice Dowding, MD 07/06/13 Emeline [id number]

## 2013-07-06 NOTE — ED Provider Notes (Addendum)
Patient complains of cough shortness of breath and chest pain right side worse with cough for the past 2-3 days. Patient was evaluated by me 07/03/2013. Diagnosed with bronchitis. She denied chest pain then. On exam patient is morbidly obese no respiratory distress HEENT exam no facial asymmetry neck tracheostomy in place lungs coughing, diffuse rhonchi  Doug SouSam Merrillyn Ackerley, MD 07/06/13 0044 I have personally seen and examined the patient.  I have discussed the plan of care with the resident.  I have reviewed the documentation on PMH/FH/Soc. History.  I have reviewed the documentation of the resident and agree. I read the EKG concurrently with the resident and agree with the resident's interpretation  Doug SouSam Ramiya Delahunty, MD 07/06/13 0050

## 2013-07-06 NOTE — ED Notes (Signed)
IV Team paged regarding IV start for CT Angio

## 2013-07-10 NOTE — ED Provider Notes (Signed)
I have personally seen and examined the patient.  I have discussed the plan of care with the resident.  I have reviewed the documentation on PMH/FH/Soc. History.  I have reviewed the documentation of the resident and agree.  Doug SouSam Nalla Purdy, MD 07/10/13 1413

## 2013-07-11 ENCOUNTER — Encounter: Payer: Self-pay | Admitting: Internal Medicine

## 2013-07-11 DIAGNOSIS — F411 Generalized anxiety disorder: Secondary | ICD-10-CM | POA: Insufficient documentation

## 2013-07-11 NOTE — Progress Notes (Signed)
Patient ID: [name], female   DOB: 1950-10-01, 63 y.o.   MRN: [id number]        HISTORY & PHYSICAL  DATE: 05/28/2013     FACILITY: [facility]  LEVEL OF CARE: SNF (31)  ALLERGIES:  Allergies  Allergen Reactions  . Penicillins Anaphylaxis    Tongue swelling  . Ace Inhibitors Other (See Comments)    Per MAR  . Aspirin Other (See Comments)    Per MAR  . Bee Venom Other (See Comments)    Unknown  . Lisinopril     Per MAR  . Nyquil Cold & [Dm-Doxylamine-Acetaminophen] Other (See Comments)    Unknown   . Ppd [Tuberculin Purified Protein Derivative] Other (See Comments)    unknown    CHIEF COMPLAINT:  Manage chronic respiratory failure, COPD, and hypertension.     HISTORY OF PRESENT ILLNESS:  The patient is a 63 year-old, African-American female who was hospitalized secondary to acute respiratory failure.  After hospitalization, patient is readmitted back to this facility for long-term care management.   [name] has the following problems:    CHRONIC RESPIRATORY FAILURE:  [name] now has a tracheostomy and [name] is 3 L of oxygen dependent at baseline.  Currently, [name] denies shortness of breath or increasing cough.    COPD: the COPD remains stable.  Pt denies sob, cough, wheezing or declining exercise tolerance.  No complications from the medications presently being used.    HTN: Pt 's HTN remains stable.  Denies CP, sob, DOE, pedal edema, headaches, dizziness or visual disturbances.  No complications from the medications currently being used.  Last BP :   138/64.    PAST MEDICAL HISTORY :  Past Medical History  Diagnosis Date  . COPD   . Hypertension   . Chronic respiratory failure     3 L oxygen  . GERD (gastroesophageal reflux disease)   . Diabetes mellitus   . Obesity, Class III, BMI 40-49.9 (morbid obesity)   . Obstructive sleep apnea on CPAP   . Peripheral vascular disease   . Bipolar disease, chronic   . Lymphedema   . COPD (chronic obstructive  pulmonary disease)   . Cellulitis   . Coronary artery disease   . Asthma   . Myocardial infarction   . Vertigo     PAST SURGICAL HISTORY: Past Surgical History  Procedure Laterality Date  . Abdominal hysterectomy    . Cholecystectomy    . Appendectomy    . Tubal ligation    . Tracheostomy      SOCIAL HISTORY:  reports that [name] has never smoked. [name] has never used smokeless tobacco. [name] reports that [name] does not drink alcohol or use illicit drugs.  FAMILY HISTORY:  Family History  Problem Relation Age of Onset  . Prostate cancer      CURRENT MEDICATIONS: Reviewed per [facility]  REVIEW OF SYSTEMS:   PSYCHIATRIC:  Complains of anxiety.    See HPI otherwise 14 point ROS is negative.  PHYSICAL EXAMINATION  VS:  T 97       P 68      RR 20    BP 138/64      POX 98%        WT (Lb) 281    GENERAL: no acute distress, morbidly obese body habitus EYES: conjunctivae normal, sclerae normal, normal eye lids MOUTH/THROAT: lips without lesions,no lesions in the mouth,tongue is without lesions,uvula elevates in midline NECK: patient has a trach  LYMPHATICS: no LAN in the neck, no supraclavicular LAN RESPIRATORY: breathing is even & unlabored, BS CTAB CARDIAC: RRR, no murmur,no extra heart sounds EDEMA/VARICOSITIES: left lower extremity has +2 edema and wrapped   GI:  ABDOMEN: abdomen soft, normal BS, no masses, no tenderness  LIVER/SPLEEN: no hepatomegaly, no splenomegaly MUSCULOSKELETAL: HEAD: normal to inspection & palpation BACK: no kyphosis, scoliosis or spinal processes tenderness EXTREMITIES: LEFT UPPER EXTREMITY: full range of motion, normal strength & tone RIGHT UPPER EXTREMITY:  full range of motion, normal strength & tone LEFT LOWER EXTREMITY: strength decreased, range of motion moderate   RIGHT LOWER EXTREMITY: strength decreased, range of motion moderate   PSYCHIATRIC: the patient is alert & oriented to person, affect & behavior appropriate  LABS/RADIOLOGY: Chest  x-ray:  Showed improved infiltrates.    Blood culture x2 showed no growth.    MRSA by PCR negative.      Tracheal aspirate showed moderate Staphylococcus aureus.    BMP normal.    WBC 11.2, hemoglobin 9.5, MCV 92.2, platelets 318.    In 03/2013:  Hemoglobin A1c 6.1.    Labs reviewed: Basic Metabolic Panel:  Recent Labs  74/25/9509/07/17 0615  06/02/13 0404 06/03/13 0405 07/03/13 1832 07/05/13 2204  NA 141  < > 140 143 139 140  K 3.2*  < > 4.0 3.8 4.3 4.3  CL 92*  < > 96 99 89* 91*  CO2 39*  < > 35* 35*  --  44*  GLUCOSE 104*  < > 93 90 99 101*  BUN 21  < > 15 12 14 11   CREATININE 0.81  < > 1.23* 0.90 1.00 0.78  CALCIUM 9.3  < > 10.1 9.9  --  9.6  MG 1.7  --  1.6 1.6  --   --   PHOS 3.9  --  2.7 5.2*  --   --   < > = values in this interval not displayed. Liver Function Tests:  Recent Labs  04/30/13 1615 05/16/13 0603 06/01/13 1503  AST 31 23 17   ALT 14 17 14   ALKPHOS 54 79 69  BILITOT 0.3 0.2* 0.2*  PROT 7.5 8.4* 7.2  ALBUMIN 3.7 2.8* 3.1*    Recent Labs  01/23/13 1230  AMMONIA 61*   CBC:  Recent Labs  05/16/13 0603  06/01/13 1503  06/03/13 0405 07/03/13 1830 07/03/13 1832 07/05/13 2204  WBC 16.2*  < > 8.6  < > 7.2 9.9  --  7.7  NEUTROABS 15.0*  --  5.4  --   --  7.0  --   --   HGB 11.1*  < > 10.3*  < > 9.8* 10.5* 11.6* 10.6*  HCT 33.7*  < > 31.0*  < > 29.2* 32.5* 34.0* 33.4*  MCV 90.3  < > 89.3  < > 88.5 87.8  --  88.6  PLT 350  < > 288  < > 258 264  --  233  < > = values in this interval not displayed.  Lipid Panel:  Recent Labs  01/22/13 1931  HDL 73   Cardiac Enzymes:  Recent Labs  05/16/13 1000 05/16/13 1637 07/03/13 1830  TROPONINI <0.30 <0.30 <0.30   CBG:  Recent Labs  06/03/13 0405 06/03/13 0814 06/03/13 1136  GLUCAP 93 89 100*    ASSESSMENT/PLAN:  Chronic respiratory failure.  Continue tracheostomy.    COPD.  Well compensated now.    Hypertension.  Well controlled.    Anxiety.  New problem.  Start Ativan 0.5  mg b.i.d. p.r.n.    CAD.  Stable.    THN Metrics:   Not on aspirin.  Nonsmoker.  Blood pressure:  138/64.    I have reviewed patient's medical records received at admission/from hospitalization.  CPT CODE: 16109

## 2013-07-16 ENCOUNTER — Non-Acute Institutional Stay (SKILLED_NURSING_FACILITY): Payer: Medicaid Other | Admitting: Internal Medicine

## 2013-07-16 DIAGNOSIS — D638 Anemia in other chronic diseases classified elsewhere: Secondary | ICD-10-CM

## 2013-07-16 DIAGNOSIS — E1149 Type 2 diabetes mellitus with other diabetic neurological complication: Secondary | ICD-10-CM

## 2013-07-16 DIAGNOSIS — J449 Chronic obstructive pulmonary disease, unspecified: Secondary | ICD-10-CM

## 2013-07-16 DIAGNOSIS — I10 Essential (primary) hypertension: Secondary | ICD-10-CM

## 2013-07-16 DIAGNOSIS — J4489 Other specified chronic obstructive pulmonary disease: Secondary | ICD-10-CM

## 2013-07-16 NOTE — Progress Notes (Signed)
         PROGRESS NOTE  DATE: 07/16/13  FACILITY: Nursing Home Location: [facility]  LEVEL OF CARE: SNF (31)  Routine Visit  CHIEF COMPLAINT:  Manage COPD, hypertension and anemia of chronic disease  HISTORY OF PRESENT ILLNESS:  REASSESSMENT OF ONGOING PROBLEM(S):  COPD: the COPD is stable.  Pt c/o sob &cough.  Denies wheezing.  No complications from the medications presently being used.  HTN: Pt 's HTN remains stable.  Denies CP, sob, DOE, headaches, dizziness or visual disturbances.  No complications from the medications currently being used.  Last BP : 146/94, 149/68, 138/64, 144/88.  ANEMIA: The anemia has been stable. The patient denies fatigue, melena or hematochezia. No complications from the medications currently being used. In 11/13 hemoglobin 9.2 MCV 85, in 5/14 hemoglobin 10, MCV 83, 7-14 hemoglobin 11.9, 11-14 hemoglobin 10.3, MCV 90  PAST MEDICAL HISTORY : Reviewed.  No changes.  [facility]  REVIEW OF SYSTEMS:  GENERAL: no change in appetite, no fatigue, no weight changes, no fever, chills or weakness RESPIRATORY: c/o cough, SOB, DOE,   Denies wheezing, hemoptysis CARDIAC: no chest pain, bilateral lower extremity edema present. No palpitations GI: no abdominal pain, diarrhea, constipation, heart burn, nausea or vomiting  PHYSICAL EXAMINATION  VS:  T 97.4      P 86      RR 22     BP 144/88    POX %     WT (Lb) 281  GENERAL: no acute distress, morbidly obese body habitus EYES: conjunctivae normal, sclerae normal, normal eye lids NECK: trach present LYMPHATICS: no LAN in the neck, no supraclavicular LAN RESPIRATORY: breathing is even & unlabored, BS decreased bilaterally CARDIAC: RRR, no murmur,no extra heart sounds, +1 right lower extremity edema, +2 left lower extremity edema, lower extremities wrapped GI: abdomen soft, normal BS, no masses, no tenderness, no hepatomegaly, no splenomegaly PSYCHIATRIC: the patient is  alert & oriented to person, affect & behavior appropriate  LABS/RADIOLOGY:  11-14 hemoglobin 10.3, the 19th otherwise CBC normal, CMP normal, Depakote, hemoglobin A1c 6.6, total cholesterol 218, triglycerides 173, and LDL 110, HDL 63  8-14 calcium 9.8 7-14 CBC normal, calcium 10.4 otherwise BMP normal, Depakote level 47  5/14 WBC 11.5, hemoglobin 10, MCV 83, platelets 338, CMP normal, phospholipid panel normal, hemoglobin A1c 6.1, Depakote level 23  12/13 BMP normal, hemoglobin A1c 6.2, Depakote 46 11/13 hemoglobin 9.2, MCV 85 otherwise CBC normal, liver profile normal, fasting lipid panel normal  ASSESSMENT/PLAN:  COPD-compensated. Trach dependent. hypertension-stable. anemia of chronic disease-hemoglobin improved.. diabetes mellitus with neuropathy-well-controlled. peripheral neuropathy-Neurontin was increased. constipation-well controlled. Hyperlipidemia-followup on next lipid panel lower extremity edema -- stable depression-stable.  CPT CODE: 1610999309

## 2013-07-24 NOTE — Progress Notes (Signed)
Patient ID: [name], female   DOB: 12/19/1950, 63 y.o.   MRN: [id number]               HISTORY & PHYSICAL  DATE: 06/04/2013      [facility]  LEVEL OF CARE: SNF (31)  ALLERGIES:  Allergies  Allergen Reactions  . Penicillins Anaphylaxis    Tongue swelling  . Ace Inhibitors Other (See Comments)    Per MAR  . Aspirin Other (See Comments)    Per MAR  . Bee Venom Other (See Comments)    Unknown  . Lisinopril     Per MAR  . Nyquil Cold & [Dm-Doxylamine-Acetaminophen] Other (See Comments)    Unknown   . Ppd [Tuberculin Purified Protein Derivative] Other (See Comments)    unknown    CHIEF COMPLAINT:  Manage COPD, hypertension, and diabetes mellitus.    HISTORY OF PRESENT ILLNESS:  The patient is a 63 year-old, African-American female who was hospitalized with acute respiratory failure.  After hospitalization, patient is readmitted back to this facility for long-term care management.   She has the following problems:    COPD: the COPD remains stable.  Pt denies sob, cough, wheezing or declining exercise tolerance.  No complications from the medications presently being used.    DM:pt's DM remains stable.  Pt denies polyuria, polydipsia, polyphagia, changes in vision or hypoglycemic episodes.  No complications noted from the medication presently being used.  Last hemoglobin A1c is:  6.6.    HTN: Pt 's HTN remains stable.  Denies CP, sob, DOE, pedal edema, headaches, dizziness or visual disturbances.  No complications from the medications currently being used.  Last BP :   150/88.    PAST MEDICAL HISTORY :  Past Medical History  Diagnosis Date  . COPD   . Hypertension   . Chronic respiratory failure     3 L oxygen  . GERD (gastroesophageal reflux disease)   . Diabetes mellitus   . Obesity, Class III, BMI 40-49.9 (morbid obesity)   . Obstructive sleep apnea on CPAP   . Peripheral vascular disease   . Bipolar disease, chronic   . Lymphedema     . COPD (chronic obstructive pulmonary disease)   . Cellulitis   . Coronary artery disease   . Asthma   . Myocardial infarction   . Vertigo     PAST SURGICAL HISTORY: Past Surgical History  Procedure Laterality Date  . Abdominal hysterectomy    . Cholecystectomy    . Appendectomy    . Tubal ligation    . Tracheostomy      SOCIAL HISTORY:  reports that she has never smoked. She has never used smokeless tobacco. She reports that she does not drink alcohol or use illicit drugs.  FAMILY HISTORY:  Family History  Problem Relation Age of Onset  . Prostate cancer      [facility]: [facility]  REVIEW OF SYSTEMS:  See HPI otherwise 14 point ROS is negative.  PHYSICAL EXAMINATION  VS:  T 98.1       P 74      RR 24     BP 150/88       GENERAL: no acute distress, morbidly obese body habitus EYES: conjunctivae normal, sclerae normal, normal eye lids MOUTH/THROAT: lips without lesions,no lesions in the mouth,tongue is without lesions,uvula elevates in midline NECK: patient has a trach   LYMPHATICS: no LAN in the neck, no supraclavicular LAN RESPIRATORY:  breathing is even & unlabored, BS CTAB CARDIAC: RRR, no murmur,no extra heart sounds ARTERIAL: left lower extremity is wrapped   GI:  ABDOMEN: abdomen soft, normal BS, no masses, no tenderness  LIVER/SPLEEN: no hepatomegaly, no splenomegaly MUSCULOSKELETAL: HEAD: normal to inspection & palpation BACK: no kyphosis, scoliosis or spinal processes tenderness EXTREMITIES: LEFT UPPER EXTREMITY: full range of motion, normal strength & tone RIGHT UPPER EXTREMITY:  full range of motion, normal strength & tone LEFT LOWER EXTREMITY: strength decreased, range of motion moderate   RIGHT LOWER EXTREMITY: strength decreased, range of motion moderate   PSYCHIATRIC: the patient is alert & oriented to person, affect & behavior appropriate  LABS/RADIOLOGY: Depakote level 28.    In 05/2013:  Total cholesterol 208, triglycerides  173, LDL 110.    Labs reviewed: Basic Metabolic Panel:  Recent Labs  16/10/96 0615  06/02/13 0404 06/03/13 0405 07/03/13 1832 07/05/13 2204  NA 141  < > 140 143 139 140  K 3.2*  < > 4.0 3.8 4.3 4.3  CL 92*  < > 96 99 89* 91*  CO2 39*  < > 35* 35*  --  44*  GLUCOSE 104*  < > 93 90 99 101*  BUN 21  < > 15 12 14 11   CREATININE 0.81  < > 1.23* 0.90 1.00 0.78  CALCIUM 9.3  < > 10.1 9.9  --  9.6  MG 1.7  --  1.6 1.6  --   --   PHOS 3.9  --  2.7 5.2*  --   --   < > = values in this interval not displayed. Liver Function Tests:  Recent Labs  04/30/13 1615 05/16/13 0603 06/01/13 1503  AST 31 23 17   ALT 14 17 14   ALKPHOS 54 79 69  BILITOT 0.3 0.2* 0.2*  PROT 7.5 8.4* 7.2  ALBUMIN 3.7 2.8* 3.1*    Recent Labs  01/23/13 1230  AMMONIA 61*   CBC:  Recent Labs  05/16/13 0603  06/01/13 1503  06/03/13 0405 07/03/13 1830 07/03/13 1832 07/05/13 2204  WBC 16.2*  < > 8.6  < > 7.2 9.9  --  7.7  NEUTROABS 15.0*  --  5.4  --   --  7.0  --   --   HGB 11.1*  < > 10.3*  < > 9.8* 10.5* 11.6* 10.6*  HCT 33.7*  < > 31.0*  < > 29.2* 32.5* 34.0* 33.4*  MCV 90.3  < > 89.3  < > 88.5 87.8  --  88.6  PLT 350  < > 288  < > 258 264  --  233  < > = values in this interval not displayed.  Lipid Panel:  Recent Labs  01/22/13 1931  HDL 73   Cardiac Enzymes:  Recent Labs  05/16/13 1000 05/16/13 1637 07/03/13 1830  TROPONINI <0.30 <0.30 <0.30   CBG:  Recent Labs  06/03/13 0405 06/03/13 0814 06/03/13 1136  GLUCAP 93 89 100*    ASSESSMENT/PLAN:  COPD.  Compensated.    Hypertension.  Blood pressure elevated.  We will review a log.    Diabetes mellitus with neuropathy.  Well controlled.    Anemia of chronic disease.  We will monitor.    Depression.  Continue current medications.    Constipation.  Well controlled.    THN Metrics:   Not on aspirin.  No tobacco use.    I have reviewed patient's medical records received at admission/from hospitalization.  CPT  CODE: 04540

## 2013-08-06 ENCOUNTER — Non-Acute Institutional Stay (SKILLED_NURSING_FACILITY): Payer: Medicaid Other | Admitting: Internal Medicine

## 2013-08-06 DIAGNOSIS — D638 Anemia in other chronic diseases classified elsewhere: Secondary | ICD-10-CM

## 2013-08-06 DIAGNOSIS — J449 Chronic obstructive pulmonary disease, unspecified: Secondary | ICD-10-CM

## 2013-08-06 DIAGNOSIS — E1149 Type 2 diabetes mellitus with other diabetic neurological complication: Secondary | ICD-10-CM

## 2013-08-06 DIAGNOSIS — I10 Essential (primary) hypertension: Secondary | ICD-10-CM

## 2013-08-06 DIAGNOSIS — J4489 Other specified chronic obstructive pulmonary disease: Secondary | ICD-10-CM

## 2013-08-13 ENCOUNTER — Non-Acute Institutional Stay (SKILLED_NURSING_FACILITY): Payer: Medicaid Other | Admitting: Internal Medicine

## 2013-08-13 ENCOUNTER — Other Ambulatory Visit: Payer: Self-pay | Admitting: *Deleted

## 2013-08-13 DIAGNOSIS — F411 Generalized anxiety disorder: Secondary | ICD-10-CM

## 2013-08-13 MED ORDER — LORAZEPAM 0.5 MG PO TABS: ORAL_TABLET | ORAL | Status: DC

## 2013-08-13 NOTE — Telephone Encounter (Signed)
[facility] 

## 2013-08-15 NOTE — Progress Notes (Signed)
         PROGRESS NOTE  DATE: 08/06/13  FACILITY: Nursing Home Location: [facility]  LEVEL OF CARE: SNF (31)  Routine Visit  CHIEF COMPLAINT:  Manage COPD, hypertension and anemia of chronic disease  HISTORY OF PRESENT ILLNESS:  REASSESSMENT OF ONGOING PROBLEM(S):  COPD: the COPD is stable.  Pt c/o sob &cough.  Denies wheezing.  No complications from the medications presently being used.  HTN: Pt 's HTN remains stable.  Denies CP, sob, DOE, headaches, dizziness or visual disturbances.  No complications from the medications currently being used.  Last BP : 146/94, 149/68, 138/64, 144/88, 148/82.  ANEMIA: The anemia has been stable. The patient denies fatigue, melena or hematochezia. No complications from the medications currently being used. In 11/13 hemoglobin 9.2 MCV 85, in 5/14 hemoglobin 10, MCV 83, 7-14 hemoglobin 11.9, 11-14 hemoglobin 10.3, MCV 90  PAST MEDICAL HISTORY : Reviewed.  No changes.  [facility] MED[facility]S: [facility]  REVIEW OF SYSTEMS: Difficult to obtain. Patient is a poor historian.  PHYSICAL EXAMINATION  VS:  T 97.3      P 88      RR 18     BP 148/82      WT (Lb) 288  GENERAL: no acute distress, morbidly obese body habitus EYES: conjunctivae normal, sclerae normal, normal eye lids NECK: trach present LYMPHATICS: no LAN in the neck, no supraclavicular LAN RESPIRATORY: breathing is even & unlabored, BS decreased bilaterally CARDIAC: RRR, no murmur,no extra heart sounds, +1 right lower extremity edema, +2 left lower extremity edema, lower extremity wrapped GI: abdomen soft, normal BS, no masses, no tenderness, no hepatomegaly, no splenomegaly PSYCHIATRIC: the patient is alert & oriented to person, affect & behavior appropriate  LABS/RADIOLOGY:  11-14 hemoglobin 10.3, MCV 90 otherwise CBC normal, CMP normal, Depakote level 28, hemoglobin A1c 6.6, total cholesterol 208, triglycerides 173, and LDL 110 otherwise fasting lipid panel  normal  8-14 calcium 9.8 7-14 CBC normal, calcium 10.4 otherwise BMP normal, Depakote level 47  5/14 WBC 11.5, hemoglobin 10, MCV 83, platelets 338, CMP normal, phospholipid panel normal, hemoglobin A1c 6.1, Depakote level 23  12/13 BMP normal, hemoglobin A1c 6.2, Depakote 46 11/13 hemoglobin 9.2, MCV 85 otherwise CBC normal, liver profile normal, fasting lipid panel normal  ASSESSMENT/PLAN:  COPD-compensated. Trach dependent. hypertension-stable. anemia of chronic disease-hemoglobin declined. Will monitor diabetes mellitus with neuropathy-well-controlled. peripheral neuropathy-on Neurontin. constipation-well controlled. Hyperlipidemia-followup on next lipid panel lower extremity edema -- stable depression-stable. Bilateral arm pain-patient is complaining of pain in bilateral arms. Will discontinue Zocor and will observe.  CPT CODE: [id number]

## 2013-08-18 ENCOUNTER — Encounter (HOSPITAL_COMMUNITY): Payer: Self-pay | Admitting: Emergency Medicine

## 2013-08-18 ENCOUNTER — Inpatient Hospital Stay (HOSPITAL_COMMUNITY)
Admission: EM | Admit: 2013-08-18 | Discharge: 2013-08-27 | DRG: 208 | Disposition: A | Discharge status: Skilled Nursing Facility | Payer: Medicaid Other | Attending: Internal Medicine | Admitting: Internal Medicine

## 2013-08-18 ENCOUNTER — Emergency Department (HOSPITAL_COMMUNITY): Payer: Medicaid Other

## 2013-08-18 DIAGNOSIS — I890 Lymphedema, not elsewhere classified: Secondary | ICD-10-CM

## 2013-08-18 DIAGNOSIS — I2510 Atherosclerotic heart disease of native coronary artery without angina pectoris: Secondary | ICD-10-CM

## 2013-08-18 DIAGNOSIS — T502X5A Adverse effect of carbonic-anhydrase inhibitors, benzothiadiazides and other diuretics, initial encounter: Secondary | ICD-10-CM | POA: Diagnosis not present

## 2013-08-18 DIAGNOSIS — I5033 Acute on chronic diastolic (congestive) heart failure: Secondary | ICD-10-CM | POA: Diagnosis present

## 2013-08-18 DIAGNOSIS — N179 Acute kidney failure, unspecified: Secondary | ICD-10-CM

## 2013-08-18 DIAGNOSIS — E119 Type 2 diabetes mellitus without complications: Secondary | ICD-10-CM | POA: Diagnosis present

## 2013-08-18 DIAGNOSIS — G4733 Obstructive sleep apnea (adult) (pediatric): Secondary | ICD-10-CM | POA: Diagnosis present

## 2013-08-18 DIAGNOSIS — R42 Dizziness and giddiness: Secondary | ICD-10-CM

## 2013-08-18 DIAGNOSIS — Z6841 Body Mass Index (BMI) 40.0 and over, adult: Secondary | ICD-10-CM

## 2013-08-18 DIAGNOSIS — J811 Chronic pulmonary edema: Secondary | ICD-10-CM

## 2013-08-18 DIAGNOSIS — I959 Hypotension, unspecified: Secondary | ICD-10-CM

## 2013-08-18 DIAGNOSIS — J309 Allergic rhinitis, unspecified: Secondary | ICD-10-CM

## 2013-08-18 DIAGNOSIS — K219 Gastro-esophageal reflux disease without esophagitis: Secondary | ICD-10-CM | POA: Diagnosis present

## 2013-08-18 DIAGNOSIS — E876 Hypokalemia: Secondary | ICD-10-CM | POA: Diagnosis not present

## 2013-08-18 DIAGNOSIS — J9610 Chronic respiratory failure, unspecified whether with hypoxia or hypercapnia: Secondary | ICD-10-CM | POA: Diagnosis present

## 2013-08-18 DIAGNOSIS — J9620 Acute and chronic respiratory failure, unspecified whether with hypoxia or hypercapnia: Secondary | ICD-10-CM | POA: Diagnosis present

## 2013-08-18 DIAGNOSIS — Z91038 Other insect allergy status: Secondary | ICD-10-CM

## 2013-08-18 DIAGNOSIS — J151 Pneumonia due to Pseudomonas: Principal | ICD-10-CM | POA: Diagnosis present

## 2013-08-18 DIAGNOSIS — Z880 Allergy status to penicillin: Secondary | ICD-10-CM

## 2013-08-18 DIAGNOSIS — G9340 Encephalopathy, unspecified: Secondary | ICD-10-CM | POA: Diagnosis present

## 2013-08-18 DIAGNOSIS — J9692 Respiratory failure, unspecified with hypercapnia: Secondary | ICD-10-CM

## 2013-08-18 DIAGNOSIS — R079 Chest pain, unspecified: Secondary | ICD-10-CM

## 2013-08-18 DIAGNOSIS — F411 Generalized anxiety disorder: Secondary | ICD-10-CM | POA: Diagnosis present

## 2013-08-18 DIAGNOSIS — Z930 Tracheostomy status: Secondary | ICD-10-CM

## 2013-08-18 DIAGNOSIS — R0600 Dyspnea, unspecified: Secondary | ICD-10-CM

## 2013-08-18 DIAGNOSIS — I5031 Acute diastolic (congestive) heart failure: Secondary | ICD-10-CM | POA: Diagnosis present

## 2013-08-18 DIAGNOSIS — F319 Bipolar disorder, unspecified: Secondary | ICD-10-CM | POA: Diagnosis present

## 2013-08-18 DIAGNOSIS — R7302 Impaired glucose tolerance (oral): Secondary | ICD-10-CM | POA: Diagnosis present

## 2013-08-18 DIAGNOSIS — T17508A Unspecified foreign body in bronchus causing other injury, initial encounter: Secondary | ICD-10-CM | POA: Diagnosis present

## 2013-08-18 DIAGNOSIS — E662 Morbid (severe) obesity with alveolar hypoventilation: Secondary | ICD-10-CM | POA: Diagnosis present

## 2013-08-18 DIAGNOSIS — I10 Essential (primary) hypertension: Secondary | ICD-10-CM | POA: Diagnosis present

## 2013-08-18 DIAGNOSIS — R1310 Dysphagia, unspecified: Secondary | ICD-10-CM | POA: Diagnosis present

## 2013-08-18 DIAGNOSIS — J449 Chronic obstructive pulmonary disease, unspecified: Secondary | ICD-10-CM | POA: Diagnosis present

## 2013-08-18 DIAGNOSIS — I509 Heart failure, unspecified: Secondary | ICD-10-CM | POA: Diagnosis present

## 2013-08-18 DIAGNOSIS — J45909 Unspecified asthma, uncomplicated: Secondary | ICD-10-CM

## 2013-08-18 DIAGNOSIS — D649 Anemia, unspecified: Secondary | ICD-10-CM | POA: Diagnosis present

## 2013-08-18 DIAGNOSIS — Z79899 Other long term (current) drug therapy: Secondary | ICD-10-CM

## 2013-08-18 DIAGNOSIS — E1149 Type 2 diabetes mellitus with other diabetic neurological complication: Secondary | ICD-10-CM

## 2013-08-18 DIAGNOSIS — Z886 Allergy status to analgesic agent status: Secondary | ICD-10-CM

## 2013-08-18 DIAGNOSIS — R7881 Bacteremia: Secondary | ICD-10-CM

## 2013-08-18 DIAGNOSIS — Z888 Allergy status to other drugs, medicaments and biological substances status: Secondary | ICD-10-CM

## 2013-08-18 DIAGNOSIS — J45901 Unspecified asthma with (acute) exacerbation: Secondary | ICD-10-CM

## 2013-08-18 DIAGNOSIS — I739 Peripheral vascular disease, unspecified: Secondary | ICD-10-CM | POA: Diagnosis present

## 2013-08-18 DIAGNOSIS — D638 Anemia in other chronic diseases classified elsewhere: Secondary | ICD-10-CM

## 2013-08-18 DIAGNOSIS — I252 Old myocardial infarction: Secondary | ICD-10-CM

## 2013-08-18 DIAGNOSIS — E861 Hypovolemia: Secondary | ICD-10-CM

## 2013-08-18 DIAGNOSIS — J9600 Acute respiratory failure, unspecified whether with hypoxia or hypercapnia: Secondary | ICD-10-CM | POA: Diagnosis present

## 2013-08-18 DIAGNOSIS — J9691 Respiratory failure, unspecified with hypoxia: Secondary | ICD-10-CM

## 2013-08-18 DIAGNOSIS — J4489 Other specified chronic obstructive pulmonary disease: Secondary | ICD-10-CM | POA: Diagnosis present

## 2013-08-18 DIAGNOSIS — J441 Chronic obstructive pulmonary disease with (acute) exacerbation: Secondary | ICD-10-CM | POA: Diagnosis present

## 2013-08-18 LAB — CBC WITH DIFFERENTIAL/PLATELET
Basophils Absolute: 0 10*3/uL (ref 0.0–0.1)
Basophils Relative: 0 % (ref 0–1)
Eosinophils Absolute: 0.1 10*3/uL (ref 0.0–0.7)
Eosinophils Relative: 1 % (ref 0–5)
HCT: 35.8 % — ABNORMAL LOW (ref 36.0–46.0)
Hemoglobin: 11.1 g/dL — ABNORMAL LOW (ref 12.0–15.0)
Lymphocytes Relative: 19 % (ref 12–46)
Lymphs Abs: 1.7 K/uL (ref 0.7–4.0)
MCH: 27.7 pg (ref 26.0–34.0)
MCHC: 31 g/dL (ref 30.0–36.0)
MCV: 89.3 fL (ref 78.0–100.0)
Monocytes Absolute: 0.4 10*3/uL (ref 0.1–1.0)
Monocytes Relative: 5 % (ref 3–12)
Neutro Abs: 6.7 10*3/uL (ref 1.7–7.7)
Neutrophils Relative %: 75 % (ref 43–77)
Platelets: 185 10*3/uL (ref 150–400)
RBC: 4.01 MIL/uL (ref 3.87–5.11)
RDW: 15.5 % (ref 11.5–15.5)
WBC: 8.9 10*3/uL (ref 4.0–10.5)

## 2013-08-18 LAB — URINALYSIS, ROUTINE W REFLEX MICROSCOPIC
Bilirubin Urine: NEGATIVE
Glucose, UA: NEGATIVE mg/dL
Hgb urine dipstick: NEGATIVE
Ketones, ur: 15 mg/dL — AB
Leukocytes, UA: NEGATIVE
Nitrite: NEGATIVE
Protein, ur: >300 mg/dL — AB
Specific Gravity, Urine: 1.031 — ABNORMAL HIGH (ref 1.005–1.030)
Urobilinogen, UA: 0.2 mg/dL (ref 0.0–1.0)
pH: 5.5 (ref 5.0–8.0)

## 2013-08-18 LAB — POCT I-STAT 3, ART BLOOD GAS (G3+)
Acid-Base Excess: 19 mmol/L — ABNORMAL HIGH (ref 0.0–2.0)
Bicarbonate: 49.8 mEq/L — ABNORMAL HIGH (ref 20.0–24.0)
O2 Saturation: 89 %
Patient temperature: 99
TCO2: >50 mmol/L (ref 0–100)
pCO2 arterial: 104.2 mmHg (ref 35.0–45.0)
pH, Arterial: 7.288 — ABNORMAL LOW (ref 7.350–7.450)
pO2, Arterial: 69 mmHg — ABNORMAL LOW (ref 80.0–100.0)

## 2013-08-18 LAB — GLUCOSE, CAPILLARY
GLUCOSE-CAPILLARY: 108 mg/dL — AB (ref 70–99)
Glucose-Capillary: 138 mg/dL — ABNORMAL HIGH (ref 70–99)

## 2013-08-18 LAB — COMPREHENSIVE METABOLIC PANEL
ALT: 9 U/L (ref 0–35)
AST: 26 U/L (ref 0–37)
Albumin: 3.5 g/dL (ref 3.5–5.2)
BUN: 12 mg/dL (ref 6–23)
CO2: 39 mEq/L — ABNORMAL HIGH (ref 19–32)
Calcium: 9.6 mg/dL (ref 8.4–10.5)
Chloride: 94 mEq/L — ABNORMAL LOW (ref 96–112)
GFR calc Af Amer: >90 mL/min (ref >90)
GFR calc non Af Amer: >90 mL/min (ref >90)
Sodium: 144 mEq/L (ref 137–147)
Total Protein: 7.5 g/dL (ref 6.0–8.3)

## 2013-08-18 LAB — URINE MICROSCOPIC-ADD ON

## 2013-08-18 LAB — COMPREHENSIVE METABOLIC PANEL WITH GFR
Alkaline Phosphatase: 65 U/L (ref 39–117)
Creatinine, Ser: 0.66 mg/dL (ref 0.50–1.10)
Glucose, Bld: 120 mg/dL — ABNORMAL HIGH (ref 70–99)
Potassium: 4.8 meq/L (ref 3.7–5.3)
Total Bilirubin: <0.2 mg/dL — ABNORMAL LOW (ref 0.3–1.2)

## 2013-08-18 LAB — MRSA PCR SCREENING: MRSA by PCR: NEGATIVE

## 2013-08-18 LAB — PRO B NATRIURETIC PEPTIDE: Pro B Natriuretic peptide (BNP): 932 pg/mL — ABNORMAL HIGH (ref 0–125)

## 2013-08-18 LAB — PROCALCITONIN: Procalcitonin: 0.18 ng/mL

## 2013-08-18 LAB — TROPONIN I: Troponin I: <0.3 ng/mL (ref <0.30)

## 2013-08-18 LAB — CG4 I-STAT (LACTIC ACID): Lactic Acid, Venous: 0.96 mmol/L (ref 0.5–2.2)

## 2013-08-18 MED ORDER — METHYLPREDNISOLONE SODIUM SUCC 125 MG IJ SOLR
80.0000 mg | Freq: Two times a day (BID) | INTRAMUSCULAR | Status: DC
  Administered 2013-08-18 – 2013-08-19 (×2): 80 mg via INTRAVENOUS
  Filled 2013-08-18 (×3): qty 1.28

## 2013-08-18 MED ORDER — SODIUM CHLORIDE 0.9 % IV SOLN
250.0000 mL | INTRAVENOUS | Status: DC | PRN
Start: 2013-08-18 — End: 2013-08-23

## 2013-08-18 MED ORDER — VANCOMYCIN HCL 10 G IV SOLR
1750.0000 mg | Freq: Once | INTRAVENOUS | Status: AC
  Administered 2013-08-18: 1750 mg via INTRAVENOUS
  Filled 2013-08-18: qty 1750

## 2013-08-18 MED ORDER — SODIUM CHLORIDE 0.9 % IV SOLN
1000.0000 mL | Freq: Once | INTRAVENOUS | Status: AC
  Administered 2013-08-18: 1000 mL via INTRAVENOUS

## 2013-08-18 MED ORDER — HEPARIN SODIUM (PORCINE) 5000 UNIT/ML IJ SOLN
5000.0000 [IU] | Freq: Three times a day (TID) | INTRAMUSCULAR | Status: DC
  Administered 2013-08-19 – 2013-08-27 (×25): 5000 [IU] via SUBCUTANEOUS
  Filled 2013-08-18 (×31): qty 1

## 2013-08-18 MED ORDER — ALBUTEROL SULFATE (2.5 MG/3ML) 0.083% IN NEBU
5.0000 mg | INHALATION_SOLUTION | Freq: Once | RESPIRATORY_TRACT | Status: AC
  Administered 2013-08-18: 5 mg via RESPIRATORY_TRACT
  Filled 2013-08-18: qty 6

## 2013-08-18 MED ORDER — BIOTENE DRY MOUTH MT LIQD
15.0000 mL | Freq: Four times a day (QID) | OROMUCOSAL | Status: DC
  Administered 2013-08-19 – 2013-08-27 (×32): 15 mL via OROMUCOSAL

## 2013-08-18 MED ORDER — INFLUENZA VAC SPLIT QUAD 0.5 ML IM SUSP
0.5000 mL | INTRAMUSCULAR | Status: AC
  Administered 2013-08-19: 0.5 mL via INTRAMUSCULAR
  Filled 2013-08-18: qty 0.5

## 2013-08-18 MED ORDER — HEPARIN SODIUM (PORCINE) 5000 UNIT/ML IJ SOLN
5000.0000 [IU] | Freq: Three times a day (TID) | INTRAMUSCULAR | Status: DC
  Administered 2013-08-18: 5000 [IU] via SUBCUTANEOUS
  Filled 2013-08-18 (×2): qty 1

## 2013-08-18 MED ORDER — DEXTROSE 5 % IV SOLN
2.0000 g | Freq: Three times a day (TID) | INTRAVENOUS | Status: DC
  Administered 2013-08-18 – 2013-08-20 (×5): 2 g via INTRAVENOUS
  Filled 2013-08-18 (×6): qty 2

## 2013-08-18 MED ORDER — VANCOMYCIN HCL IN DEXTROSE 1-5 GM/200ML-% IV SOLN: 1000.0000 mg | Freq: Once | INTRAVENOUS | Status: DC

## 2013-08-18 MED ORDER — PANTOPRAZOLE SODIUM 40 MG IV SOLR
40.0000 mg | Freq: Every day | INTRAVENOUS | Status: DC
  Administered 2013-08-18 – 2013-08-20 (×3): 40 mg via INTRAVENOUS
  Filled 2013-08-18 (×5): qty 40

## 2013-08-18 MED ORDER — SODIUM CHLORIDE 0.9 % IV SOLN
INTRAVENOUS | Status: DC
  Administered 2013-08-20: 16:00:00 via INTRAVENOUS
  Administered 2013-08-24: 1 mL via INTRAVENOUS

## 2013-08-18 MED ORDER — METHYLPREDNISOLONE SODIUM SUCC 125 MG IJ SOLR
125.0000 mg | Freq: Once | INTRAMUSCULAR | Status: AC
  Administered 2013-08-18: 125 mg via INTRAVENOUS
  Filled 2013-08-18: qty 2

## 2013-08-18 MED ORDER — PNEUMOCOCCAL VAC POLYVALENT 25 MCG/0.5ML IJ INJ
0.5000 mL | INJECTION | INTRAMUSCULAR | Status: AC
Start: 2013-08-19 — End: 2013-08-19
  Administered 2013-08-19: 0.5 mL via INTRAMUSCULAR
  Filled 2013-08-18: qty 0.5

## 2013-08-18 MED ORDER — DEXTROSE 5 % IV SOLN
2.0000 g | Freq: Once | INTRAVENOUS | Status: AC
  Administered 2013-08-18: 2 g via INTRAVENOUS
  Filled 2013-08-18: qty 2

## 2013-08-18 MED ORDER — IPRATROPIUM BROMIDE 0.02 % IN SOLN
0.5000 mg | Freq: Once | RESPIRATORY_TRACT | Status: AC
  Administered 2013-08-18: 0.5 mg via RESPIRATORY_TRACT
  Filled 2013-08-18: qty 2.5

## 2013-08-18 MED ORDER — VANCOMYCIN HCL 10 G IV SOLR
1250.0000 mg | Freq: Two times a day (BID) | INTRAVENOUS | Status: DC
  Administered 2013-08-19: 1250 mg via INTRAVENOUS
  Filled 2013-08-18 (×2): qty 1250

## 2013-08-18 MED ORDER — IPRATROPIUM-ALBUTEROL 0.5-2.5 (3) MG/3ML IN SOLN
3.0000 mL | Freq: Four times a day (QID) | RESPIRATORY_TRACT | Status: DC
  Administered 2013-08-18 – 2013-08-27 (×32): 3 mL via RESPIRATORY_TRACT
  Filled 2013-08-18 (×32): qty 3

## 2013-08-18 MED ORDER — FUROSEMIDE 10 MG/ML IJ SOLN: 40.0000 mg | Freq: Once | INTRAMUSCULAR | Status: DC

## 2013-08-18 MED ORDER — SODIUM CHLORIDE 0.9 % IV SOLN: 1000.0000 mL | INTRAVENOUS | Status: DC

## 2013-08-18 MED ORDER — METHYLPREDNISOLONE SODIUM SUCC 125 MG IJ SOLR: 80.0000 mg | Freq: Two times a day (BID) | INTRAMUSCULAR | Status: DC

## 2013-08-18 MED ORDER — INSULIN ASPART 100 UNIT/ML ~~LOC~~ SOLN
2.0000 [IU] | SUBCUTANEOUS | Status: DC
Start: 2013-08-18 — End: 2013-08-20
  Administered 2013-08-18 – 2013-08-20 (×9): 2 [IU] via SUBCUTANEOUS

## 2013-08-18 MED ORDER — ALBUTEROL SULFATE (2.5 MG/3ML) 0.083% IN NEBU: 2.5000 mg | INHALATION_SOLUTION | RESPIRATORY_TRACT | Status: DC | PRN

## 2013-08-18 MED ORDER — BUDESONIDE 0.25 MG/2ML IN SUSP
0.2500 mg | Freq: Two times a day (BID) | RESPIRATORY_TRACT | Status: DC
  Administered 2013-08-18 – 2013-08-26 (×15): 0.25 mg via RESPIRATORY_TRACT
  Filled 2013-08-18 (×20): qty 2

## 2013-08-18 MED ORDER — CHLORHEXIDINE GLUCONATE 0.12 % MT SOLN
15.0000 mL | Freq: Two times a day (BID) | OROMUCOSAL | Status: DC
  Administered 2013-08-18 – 2013-08-27 (×18): 15 mL via OROMUCOSAL
  Filled 2013-08-18 (×19): qty 15

## 2013-08-18 NOTE — Progress Notes (Addendum)
Called to pt's room to assess for desaturation. Pt has chronic trach #6 Shiley XLT, and per pt usually wears 28% ATC at SNF. FiO2 set at 40%, titrated up to 60%, PMV removed, checked for trach dislodgement-CO2 detector had good color change. Pt lavaged and suctioned but only returned small amount of thick, white secretions. RR in the upper 30's. Pt does have episodes of desaturation during and immediately after suctioning, but quickly recovers to around 92-93%.  Nebulizer treatment currently being given. Pt has very diminished bbs, but faint, fine crackles can also be heard. ATC set up changed, as well as trach ties. Extra #6 shiley XLT uncuffed and #6 shiley XLT cuffed at bedside. Pt appears more comfortable during neb treatment, and is dozing off to sleep. RT will continue to monitor.

## 2013-08-18 NOTE — ED Notes (Signed)
Spoke with Aurea GraffJoan-- daughter-- will come to hospital

## 2013-08-18 NOTE — Progress Notes (Signed)
Trach changed without incidence. Pt tolerated procedure well. RT will continue to monitor.

## 2013-08-18 NOTE — ED Notes (Signed)
22G Iv started per IV team in right wrist area. Labs drawn also

## 2013-08-18 NOTE — Progress Notes (Signed)
Called to bedside to change trach to cuffed trach, and to placed pt on vent.  Pt initially had #6 cuffless Shiley Proximal trach.  Pt trach changed to #6 shiley cuffed Proximal trach, w/ some difficulty.  + easy cap color change (purple to yellow), + BBSH diminished clear= t/o.  Sats 100% on 60% fio2.  No distress noted.  VSS.

## 2013-08-18 NOTE — Progress Notes (Signed)
ANTIBIOTIC CONSULT NOTE - INITIAL  Pharmacy Consult for Vancomycin and Aztreonam Indication: pneumonia  Allergies  Allergen Reactions  . Penicillins Anaphylaxis    Tongue swelling  . Ace Inhibitors Other (See Comments)    Per MAR  . Aspirin Other (See Comments)    Per MAR  . Bee Venom Other (See Comments)    Unknown  . Lisinopril     Per MAR  . Nyquil Cold & [Dm-Doxylamine-Acetaminophen] Other (See Comments)    Unknown   . Ppd [Tuberculin Purified Protein Derivative] Other (See Comments)    unknown    Patient Measurements: Height: 5' (152.4 cm) Weight: 271 lb (122.925 kg) IBW/kg (Calculated) : 45.5 Adjusted Body Weight:   Vital Signs: Temp: 99 F (37.2 C) (02/15 1058) Temp src: Oral (02/15 1058) BP: 146/75 mmHg (02/15 1300) Pulse Rate: 92 (02/15 1300) Intake/Output from previous day:   Intake/Output from this shift:    Labs:  Recent Labs  08/18/13 1230  WBC 8.9  HGB 11.1*  PLT 185  CREATININE 0.66   Estimated Creatinine Clearance: 85.8 ml/min (by C-G formula based on Cr of 0.66). No results found for this basename: VANCOTROUGH, VANCOPEAK, VANCORANDOM, GENTTROUGH, GENTPEAK, GENTRANDOM, TOBRATROUGH, TOBRAPEAK, TOBRARND, AMIKACINPEAK, AMIKACINTROU, AMIKACIN,  in the last 72 hours   Microbiology: No results found for this or any previous visit (from the past 720 hour(s)).  Medical History: Past Medical History  Diagnosis Date  . COPD   . Hypertension   . Chronic respiratory failure     3 L oxygen  . GERD (gastroesophageal reflux disease)   . Diabetes mellitus   . Obesity, Class III, BMI 40-49.9 (morbid obesity)   . Obstructive sleep apnea on CPAP   . Peripheral vascular disease   . Bipolar disease, chronic   . Lymphedema   . COPD (chronic obstructive pulmonary disease)   . Cellulitis   . Coronary artery disease   . Asthma   . Myocardial infarction   . Vertigo     Medications:  Scheduled:  . budesonide (PULMICORT) nebulizer solution  0.25 mg  Nebulization BID  . heparin  5,000 Units Subcutaneous 3 times per day  . ipratropium-albuterol  3 mL Nebulization Q6H  . methylPREDNISolone (SOLU-MEDROL) injection  125 mg Intravenous Once  . methylPREDNISolone (SOLU-MEDROL) injection  80 mg Intravenous Q12H  . 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  Baseline Cr < 1 and blood cultures drawn.  Goal of Therapy:  Vancomycin trough level 15-20 mcg/ml  Plan:  1-  Vancomycin 1750mg  IV x 1, then 1250mg  IV q12 2-  Azactam 2gm IV q8 3-  F/U culture results and renal fxn 4-  Steady state Vancomycin trough when appropriate  Marisue HumbleKendra Sahil Milner, PharmD Clinical Pharmacist [facility]

## 2013-08-18 NOTE — ED Provider Notes (Signed)
CSN: [id number]     Arrival date & time 08/18/13  1053 History   First MD Initiated Contact with Patient 08/18/13 1101     Chief Complaint  Patient presents with  . Shortness of Breath     (Consider location/radiation/quality/duration/timing/severity/associated sxs/prior Treatment) HPI Comments: Level 5 caveat due to inability to speak due to trach and pt condition.  Pt has been sick for a few weeks, but can only answer me with shaking and nodding head, coughing severely in the room.  She has a trach.  Records indicate a h/o Bipolar, asthma, allergic rhinitis, sleep apnea, obesity, HTN.  Pt says yes to fevers, chills, no to nausea, vomiting, diarrhea.  Prior records indicate similar presentations with SOB and having to have secretions suctioned or trach changed.  RT is at bedside, unable to suction much secretions from trach.    Patient is a 63 y.o. female presenting with shortness of breath. The history is provided by the patient, medical records and the EMS personnel.  Shortness of Breath   Past Medical History  Diagnosis Date  . COPD   . Hypertension   . Chronic respiratory failure     3 L oxygen  . GERD (gastroesophageal reflux disease)   . Diabetes mellitus   . Obesity, Class III, BMI 40-49.9 (morbid obesity)   . Obstructive sleep apnea on CPAP   . Peripheral vascular disease   . Bipolar disease, chronic   . Lymphedema   . COPD (chronic obstructive pulmonary disease)   . Cellulitis   . Coronary artery disease   . Asthma   . Myocardial infarction   . Vertigo    Past Surgical History  Procedure Laterality Date  . Abdominal hysterectomy    . Cholecystectomy    . Appendectomy    . Tubal ligation    . Tracheostomy     Family History  Problem Relation Age of Onset  . Prostate cancer     History  Substance Use Topics  . Smoking status: Never Smoker   . Smokeless tobacco: Never Used  . Alcohol Use: No   OB History   Grav Para Term Preterm Abortions TAB SAB Ect Mult  Living                 Review of Systems  Unable to perform ROS: Acuity of condition  Respiratory: Positive for shortness of breath.       Allergies  Penicillins; Ace inhibitors; Aspirin; Bee venom; Lisinopril; Nyquil cold &; and Ppd  Home Medications   Current Outpatient Rx  Name  Route  Sig  Dispense  Refill  . albuterol (PROVENTIL) (2.5 MG/3ML) 0.083% nebulizer solution   Nebulization   Take 3 mLs (2.5 mg total) by nebulization every 4 (four) hours as needed for wheezing.   75 mL   12   . amLODipine (NORVASC) 10 MG tablet   Oral   Take 10 mg by mouth daily.         Marland Kitchen. arformoterol (BROVANA) 15 MCG/2ML NEBU   Nebulization   Take 2 mLs (15 mcg total) by nebulization 2 (two) times daily.   120 mL   6   . ARIPiprazole (ABILIFY) 10 MG tablet   Oral   Take 10 mg by mouth daily. For depressive psychosis         . ascorbic acid (VITAMIN C) 500 MG tablet   Oral   Take 500 mg by mouth 2 (two) times daily.         .Marland Kitchen  budesonide-formoterol (SYMBICORT) 160-4.5 MCG/ACT inhaler   Inhalation   Inhale 2 puffs into the lungs 2 (two) times daily.         . busPIRone (BUSPAR) 10 MG tablet   Oral   Take 10 mg by mouth 2 (two) times daily.         . cloNIDine (CATAPRES) 0.2 MG tablet   Oral   Take 0.2 mg by mouth 2 (two) times daily.         . divalproex (DEPAKOTE ER) 250 MG 24 hr tablet   Oral   Take 250 mg by mouth 2 (two) times daily.         . DULoxetine (CYMBALTA) 60 MG capsule   Oral   Take 60 mg by mouth 2 (two) times daily.         . famotidine (PEPCID) 20 MG tablet   Oral   Take 20 mg by mouth daily.          . fluticasone (FLONASE) 50 MCG/ACT nasal spray   Nasal   Place 2 sprays into the nose daily.         . isosorbide mononitrate (IMDUR) 30 MG 24 hr tablet   Oral   Take 30 mg by mouth daily.         Marland Kitchen LORazepam (ATIVAN) 0.5 MG tablet   Oral   Take 0.5 mg by mouth 2 (two) times daily as needed for anxiety.         .  nitroGLYCERIN (NITROSTAT) 0.4 MG SL tablet   Sublingual   Place 0.4 mg under the tongue every 5 (five) minutes as needed for chest pain.          Marland Kitchen senna (SENOKOT) 8.6 MG TABS   Oral   Take 2 tablets by mouth daily.         . simethicone (MYLICON) 80 MG chewable tablet   Oral   Chew 80 mg by mouth every 8 (eight) hours.         . simvastatin (ZOCOR) 10 MG tablet   Oral   Take 10 mg by mouth at bedtime.         Marland Kitchen tiotropium (SPIRIVA) 18 MCG inhalation capsule   Inhalation   Place 18 mcg into inhaler and inhale daily.         . vitamin D, CHOLECALCIFEROL, 400 UNITS tablet   Oral   Take 400 Units by mouth daily.          BP 143/62  Pulse 99  Temp(Src) 99 F (37.2 C) (Oral)  Resp 22  Ht 5' (1.524 m)  Wt 271 lb (122.925 kg)  BMI 52.93 kg/m2  SpO2 93% Physical Exam  Nursing note and vitals reviewed. Constitutional: She appears well-developed and well-nourished. She is cooperative. She has a sickly appearance. She appears ill. She appears distressed.  HENT:  Head: Normocephalic and atraumatic.  Eyes: EOM are normal. Right conjunctiva is injected. Left conjunctiva is injected.  Cardiovascular: Normal rate.   Pulmonary/Chest: Accessory muscle usage present. Tachypnea noted. She is in respiratory distress. She has no decreased breath sounds. She has rhonchi.  Severe paroxysmal coughing and upper airway wheeze intermittnetly  Abdominal: Soft. There is no tenderness. There is no rebound and no guarding.  Neurological: She is alert.  Skin: Skin is warm. She is diaphoretic.    ED Course  Procedures (including critical care time)   CRITICAL CARE Performed by: Lear Ng. Total critical care time: 30 min Critical care time  was exclusive of separately billable procedures and treating other patients. Critical care was necessary to treat or prevent imminent or life-threatening deterioration. Critical care was time spent personally by me on the following  activities: development of treatment plan with patient and/or surrogate as well as nursing, discussions with consultants, evaluation of patient's response to treatment, examination of patient, obtaining history from patient or surrogate, ordering and performing treatments and interventions, ordering and review of laboratory studies, ordering and review of radiographic studies, pulse oximetry and re-evaluation of patient's condition.   Labs Review Labs Reviewed  CBC WITH DIFFERENTIAL - Abnormal; Notable for the following:    Hemoglobin 11.1 (*)    HCT 35.8 (*)    All other components within normal limits  POCT I-STAT 3, BLOOD GAS (G3+) - Abnormal; Notable for the following:    pH, Arterial 7.288 (*)    pCO2 arterial 104.2 (*)    pO2, Arterial 69.0 (*)    Bicarbonate 49.8 (*)    Acid-Base Excess 19.0 (*)    All other components within normal limits  CULTURE, BLOOD (ROUTINE X 2)  CULTURE, BLOOD (ROUTINE X 2)  URINE CULTURE  COMPREHENSIVE METABOLIC PANEL  URINALYSIS, ROUTINE W REFLEX MICROSCOPIC  BLOOD GAS, ARTERIAL  PRO B NATRIURETIC PEPTIDE  CG4 I-STAT (LACTIC ACID)   Imaging Review Dg Chest Port 1 View  08/18/2013   CLINICAL DATA:  SHORTNESS OF BREATH  EXAM: PORTABLE CHEST - 1 VIEW  COMPARISON:  CT ANGIO CHEST W/CM &/OR WO/CM dated 07/06/2013; DG CHEST 2 VIEW dated 07/03/2013  FINDINGS: Low lung volumes. There is prominence of interstitial markings, peribronchial cuffing, and indistinctness of the pulmonary vasculature. An endotracheal tube is identified with tip at the level of clavicles. Metallic density having the appearance of spurring projects in the left lung apex likely reflecting an external device. The osseous structures unremarkable. Cardiac silhouette is enlarged.  IMPRESSION: Interstitial findings as described above likely reflecting pulmonary edema. Findings are accentuated by the patient's low lung volumes. No focal regions of consolidation appreciated.   Electronically Signed    By: Salome Holmes M.D.   On: 08/18/2013 11:39    EKG Interpretation    Date/Time:  Sunday August 18 2013 10:59:03 EST Ventricular Rate:  99 PR Interval:  169 QRS Duration: 73 QT Interval:  347 QTC Calculation: 445 R Axis:   51 Text Interpretation:  Sinus rhythm Probable left atrial enlargement Borderline ECG Confirmed by [facility]  MD, MICHEAL ([id number]) on 08/18/2013 11:56:48 AM           Blow by O2 sat is 77% and I interpret to be abnormal  12:59 PM Pt's sats are improved, RT also changed out trach.  CXR shows congestion, no infiltrate.  Code sepsis initiated as pt appeared to be in resp. distress with symptoms consistent with URI situation.  Cultures drawn.  Will need positive pressure ventilation adn ABG is pending.  Would consider pulmonary consultation for admission versus step down.     1:31 PM PCO2 is >161, will certainly positive pressure and likely ICU admission.  Will give steroids also along with more nebs.  Pt's mental status has been gradually decreasing and appears more obtunded.  PCO2 is likely explanation.    1:47 PM intensivist contacted, will be down to see patient and likely admit.        MDM   Final diagnoses:  Respiratory failure with hypoxia and hypercapnia  Asthma  Pulmonary edema    Pt with h/o asthma, appears to be  having acute bronchospasm and O2 sats will drop during severe coughing episode into the 70's, then gradually improve to 80's once that stops.    After increasing fiO2, and giving albuterol, pt is somewhat more calm and O2 sats up to mid and low 90's now.  Difficult IV access, able to obtain labs using U/S.  Attempted IV once in right antecub, once on left as well as left forearm with flash, but no functional IV.      Gavin Pound. Adine Heimann, MD 08/18/13 1348

## 2013-08-18 NOTE — H&P (Signed)
PULMONARY / CRITICAL CARE MEDICINE  Name: [name] MRN: [id number] DOB: 07/26/1950    ADMISSION DATE:  08/18/2013  REFERRING MD :  EDP  PRIMARY SERVICE: PCCM   CHIEF COMPLAINT: Hypoxic/Hypercarbic Resp Failure  BRIEF PATIENT DESCRIPTION: 63 yo morbidly obese [facility] pt with known hx of COPD , Chronic Resp, Failure , OSA/OHS Trach dependent brought to ER on 2/15 with increased dyspnea, hypoxia, fever  and altered mental status found to be profoundly hypercarbic with PCO2~104, placed on vent . PCCM to admit .   SIGNIFICANT EVENTS / STUDIES:   LINES / TUBES: Chronic trach, changed 2/15 >>>  CULTURES: 2/15 BC x 2 >> 2/15 UC >> 2/15 Respiratory >>  ANTIBIOTICS: 2/15 Aztreonam >> 2/15 Vanc >>  HISTORY OF PRESENT ILLNESS:   63 yo morbidly obese [facility] pt with known hx of COPD , Chronic Resp, Failure , OSA/OHS Trach dependent brought to ER on 2/15 with increased dyspnea, hypoxia, fever  and altered mental status found to be profoundly hypercarbic with PCO2~104,   Pt w/ wheezing in ER, given solumedrol in ER.  Given 1 L NS , started on IVF 125cc /hr . Placed on vent w/ full suport. PCCM asked to admit. Unable to get any information from pt. Notes revealed increased cough for few weeks . On trach collar at SNF .   PAST MEDICAL HISTORY :  Past Medical History  Diagnosis Date  . COPD   . Hypertension   . Chronic respiratory failure     3 L oxygen  . GERD (gastroesophageal reflux disease)   . Diabetes mellitus   . Obesity, Class III, BMI 40-49.9 (morbid obesity)   . Obstructive sleep apnea on CPAP   . Peripheral vascular disease   . Bipolar disease, chronic   . Lymphedema   . COPD (chronic obstructive pulmonary disease)   . Cellulitis   . Coronary artery disease   . Asthma   . Myocardial infarction   . Vertigo    Past Surgical History  Procedure Laterality Date  . Abdominal hysterectomy    . Cholecystectomy    . Appendectomy    . Tubal ligation    . Tracheostomy      Prior to Admission medications   Medication Sig Start Date End Date Taking? Authorizing Provider  albuterol (PROVENTIL) (2.5 MG/3ML) 0.083% nebulizer solution Take 3 mLs (2.5 mg total) by nebulization every 4 (four) hours as needed for wheezing. 05/09/13  Yes Jeanella CrazeBrandi L Ollis, NP  amLODipine (NORVASC) 10 MG tablet Take 10 mg by mouth daily.   Yes Historical Provider, MD  arformoterol (BROVANA) 15 MCG/2ML NEBU Take 2 mLs (15 mcg total) by nebulization 2 (two) times daily. 05/21/13  Yes Calvert CantorSaima Rizwan, MD  ARIPiprazole (ABILIFY) 10 MG tablet Take 10 mg by mouth daily. For depressive psychosis   Yes Historical Provider, MD  ascorbic acid (VITAMIN C) 500 MG tablet Take 500 mg by mouth 2 (two) times daily.   Yes Historical Provider, MD  budesonide-formoterol (SYMBICORT) 160-4.5 MCG/ACT inhaler Inhale 2 puffs into the lungs 2 (two) times daily.   Yes Historical Provider, MD  busPIRone (BUSPAR) 10 MG tablet Take 10 mg by mouth 2 (two) times daily.   Yes Historical Provider, MD  cloNIDine (CATAPRES) 0.2 MG tablet Take 0.2 mg by mouth 2 (two) times daily.   Yes Historical Provider, MD  divalproex (DEPAKOTE ER) 250 MG 24 hr tablet Take 250 mg by mouth 2 (two) times daily.   Yes Historical Provider,  MD  DULoxetine (CYMBALTA) 60 MG capsule Take 60 mg by mouth 2 (two) times daily.   Yes Historical Provider, MD  famotidine (PEPCID) 20 MG tablet Take 20 mg by mouth daily.    Yes Historical Provider, MD  fluticasone (FLONASE) 50 MCG/ACT nasal spray Place 2 sprays into the nose daily.   Yes Historical Provider, MD  isosorbide mononitrate (IMDUR) 30 MG 24 hr tablet Take 30 mg by mouth daily.   Yes Historical Provider, MD  LORazepam (ATIVAN) 0.5 MG tablet Take 0.5 mg by mouth 2 (two) times daily as needed for anxiety.   Yes Historical Provider, MD  nitroGLYCERIN (NITROSTAT) 0.4 MG SL tablet Place 0.4 mg under the tongue every 5 (five) minutes as needed for chest pain.    Yes Historical Provider, MD  senna (SENOKOT)  8.6 MG TABS Take 2 tablets by mouth daily.   Yes Historical Provider, MD  simethicone (MYLICON) 80 MG chewable tablet Chew 80 mg by mouth every 8 (eight) hours.   Yes Historical Provider, MD  simvastatin (ZOCOR) 10 MG tablet Take 10 mg by mouth at bedtime.   Yes Historical Provider, MD  tiotropium (SPIRIVA) 18 MCG inhalation capsule Place 18 mcg into inhaler and inhale daily.   Yes Historical Provider, MD  vitamin D, CHOLECALCIFEROL, 400 UNITS tablet Take 400 Units by mouth daily.   Yes Historical Provider, MD   Allergies  Allergen Reactions  . Penicillins Anaphylaxis    Tongue swelling  . Ace Inhibitors Other (See Comments)    Per MAR  . Aspirin Other (See Comments)    Per MAR  . Bee Venom Other (See Comments)    Unknown  . Lisinopril     Per MAR  . Nyquil Cold & [Dm-Doxylamine-Acetaminophen] Other (See Comments)    Unknown   . Ppd [Tuberculin Purified Protein Derivative] Other (See Comments)    unknown   FAMILY HISTORY:  Family History  Problem Relation Age of Onset  . Prostate cancer     SOCIAL HISTORY:  reports that she has never smoked. She has never used smokeless tobacco. She reports that she does not drink alcohol or use illicit drugs.  REVIEW OF SYSTEMS:   Records reviewed ,  Pt lethargic on vent unable to answer questions.   INTERVAL HISTORY:  VITAL SIGNS: Temp:  [99 F (37.2 C)] 99 F (37.2 C) (02/15 1058) Pulse Rate:  [99-107] 99 (02/15 1132) Resp:  [22-32] 22 (02/15 1132) BP: (143-146)/(62-89) 143/62 mmHg (02/15 1132) SpO2:  [77 %-100 %] 100 % (02/15 1413) FiO2 (%):  [60 %] 60 % (02/15 1413) Weight:  [122.925 kg (271 lb)] 122.925 kg (271 lb) (02/15 1132) HEMODYNAMICS:   VENTILATOR SETTINGS: Vent Mode:  [-] PRVC FiO2 (%):  [60 %] 60 % Set Rate:  [16 bmp] 16 bmp Vt Set:  [360 mL] 360 mL PEEP:  [5 cmH20] 5 cmH20 Plateau Pressure:  [24 cmH20] 24 cmH20  INTAKE / OUTPUT: Intake/Output   None     PHYSICAL EXAMINATION: General:  Morbidly obese , on  vent  Neuro:  Lethargic, follows simple commands. , gen weakness  HEENT: yellow eye discharge, Trach in place w/ bloody mucus  Cardiovascular:  RRR , no m/r/g , `+ edema  Lungs: Diminshed BS in bases  Abdomen:  Obese , soft  Musculoskeletal:  Intact  Skin:  Left LE dressing  LABS:  CBC  Recent Labs Lab 08/18/13 1230  WBC 8.9  HGB 11.1*  HCT 35.8*  PLT 185   Coag's  No results found for this basename: APTT, INR,  in the last 168 hours BMET No results found for this basename: NA, K, CL, CO2, BUN, CREATININE, GLUCOSE,  in the last 168 hours Electrolytes No results found for this basename: CALCIUM, MG, PHOS,  in the last 168 hours Sepsis Markers  Recent Labs Lab 08/18/13 1308  LATICACIDVEN 0.96   ABG  Recent Labs Lab 08/18/13 1327  PHART 7.288*  PCO2ART 104.2*  PO2ART 69.0*   Liver Enzymes No results found for this basename: AST, ALT, ALKPHOS, BILITOT, ALBUMIN,  in the last 168 hours Cardiac Enzymes No results found for this basename: TROPONINI, PROBNP,  in the last 168 hours Glucose No results found for this basename: GLUCAP,  in the last 168 hours  Imaging Dg Chest Port 1 View  08/18/2013   CLINICAL DATA:  SHORTNESS OF BREATH  EXAM: PORTABLE CHEST - 1 VIEW  COMPARISON:  CT ANGIO CHEST W/CM &/OR WO/CM dated 07/06/2013; DG CHEST 2 VIEW dated 07/03/2013  FINDINGS: Low lung volumes. There is prominence of interstitial markings, peribronchial cuffing, and indistinctness of the pulmonary vasculature. An endotracheal tube is identified with tip at the level of clavicles. Metallic density having the appearance of spurring projects in the left lung apex likely reflecting an external device. The osseous structures unremarkable. Cardiac silhouette is enlarged.  IMPRESSION: Interstitial findings as described above likely reflecting pulmonary edema. Findings are accentuated by the patient's low lung volumes. No focal regions of consolidation appreciated.   Electronically Signed    By: Salome Holmes M.D.   On: 08/18/2013 11:39   ASSESSMENT / PLAN:  PULMONARY A:  Acute on chronic hypoxemic / hypercarbic respiratory failure OHS/OSA COPD w/ exacerbation  Tracheostomy status P:   Goal pH>7.30, SpO2>92 Continuous mechanical support VAP bundle Daily SBT Trend ABG/CXR Albuterol / Atrovent / Pulmicort Solu-Medrol 80 q12h Hold Spiriva / Symbicort  CARDIOVASCULAR A:  Chronic diastolic congestive heart failure HTN  Lactate (0.9) reassuring P:  Trend  BNP Check troponin   RENAL A:  No active issues  P:   Monitor and replace electrolytes as indicated IVF@KVO   GASTROINTESTINAL A:   GI Px  P:   Protonix NPO for now   HEMATOLOGIC A:   Anemia  VTE Px  P:  Monitor  Heparin   INFECTIOUS A: Suspected HCAP  COPD exacerbation   P:   Check PCT  Abx / cx as above   ENDOCRINE A:   DM  P:   SSI   NEUROLOGIC A:   Acute encephalopathy in setting of severe hypercarbia  Bipolar Dz  P:   Avoid Benzo if possible  Hold psych meds for now until mentation improves   PARRETT,TAMMY NP-C  Pulmonary and Critical Care Medicine [facility] Pager: [phone]  08/18/2013, 2:16 PM  I have personally obtained history, examined patient, evaluated and interpreted laboratory and imaging results, reviewed medical records, formulated assessment / plan and placed orders.  CRITICAL CARE:  The patient is critically ill with multiple organ systems failure and requires high complexity decision making for assessment and support, frequent evaluation and titration of therapies, application of advanced monitoring technologies and extensive interpretation of multiple databases. Critical Care Time devoted to patient care services described in this note is 40 minutes.   Lonia Farber, MD Pulmonary and Critical Care Medicine [facility] Pager: [phone]  08/18/2013, 3:26 PM

## 2013-08-18 NOTE — ED Notes (Signed)
Daughter took valve for trach and dentures home

## 2013-08-18 NOTE — ED Notes (Addendum)
[facility] pt called 911 herself-- not nursing home staff. Pt has hx of trach-- EMS states trach was clogged and kinked and had to replace the inner canula and suctions for large amount thick green mucus.

## 2013-08-18 NOTE — ED Notes (Signed)
Pt difficult IV stick-- 2 IV nurses and Dr. Oletta LamasGhim attempting to start an IV

## 2013-08-18 NOTE — ED Notes (Signed)
[facility]

## 2013-08-19 ENCOUNTER — Inpatient Hospital Stay (HOSPITAL_COMMUNITY): Payer: Medicaid Other

## 2013-08-19 DIAGNOSIS — I5031 Acute diastolic (congestive) heart failure: Secondary | ICD-10-CM

## 2013-08-19 DIAGNOSIS — I509 Heart failure, unspecified: Secondary | ICD-10-CM

## 2013-08-19 LAB — CBC
HEMATOCRIT: 34.4 % — AB (ref 36.0–46.0)
Hemoglobin: 10.6 g/dL — ABNORMAL LOW (ref 12.0–15.0)
MCH: 27.3 pg (ref 26.0–34.0)
MCHC: 30.8 g/dL (ref 30.0–36.0)
MCV: 88.7 fL (ref 78.0–100.0)
Platelets: 240 10*3/uL (ref 150–400)
RBC: 3.88 MIL/uL (ref 3.87–5.11)
RDW: 15.5 % (ref 11.5–15.5)
WBC: 6.4 10*3/uL (ref 4.0–10.5)

## 2013-08-19 LAB — BASIC METABOLIC PANEL
BUN: 18 mg/dL (ref 6–23)
CHLORIDE: 94 meq/L — AB (ref 96–112)
CO2: 38 mEq/L — ABNORMAL HIGH (ref 19–32)
Calcium: 9.3 mg/dL (ref 8.4–10.5)
Creatinine, Ser: 0.74 mg/dL (ref 0.50–1.10)
GFR calc Af Amer: >90 mL/min (ref >90)
GFR, EST NON AFRICAN AMERICAN: 88 mL/min — AB (ref >90)
Glucose, Bld: 153 mg/dL — ABNORMAL HIGH (ref 70–99)
POTASSIUM: 4.9 meq/L (ref 3.7–5.3)
Sodium: 142 mEq/L (ref 137–147)

## 2013-08-19 LAB — PHOSPHORUS
Phosphorus: 4.3 mg/dL (ref 2.3–4.6)
Phosphorus: 3.8 mg/dL (ref 2.3–4.6)

## 2013-08-19 LAB — BLOOD GAS, ARTERIAL
Acid-Base Excess: 14.9 mmol/L — ABNORMAL HIGH (ref 0.0–2.0)
BICARBONATE: 41.6 meq/L — AB (ref 20.0–24.0)
Drawn by: 10006
FIO2: 0.4 %
MECHVT: 360 mL
O2 Saturation: 94.7 %
PATIENT TEMPERATURE: 98.6
PEEP: 5 cmH2O
PH ART: 7.318 — AB (ref 7.350–7.450)
RATE: 16 resp/min
TCO2: 44.1 mmol/L (ref 0–100)
pCO2 arterial: 83.4 mmHg (ref 35.0–45.0)
pO2, Arterial: 76.9 mmHg — ABNORMAL LOW (ref 80.0–100.0)

## 2013-08-19 LAB — GLUCOSE, CAPILLARY
Glucose-Capillary: 120 mg/dL — ABNORMAL HIGH (ref 70–99)
Glucose-Capillary: 127 mg/dL — ABNORMAL HIGH (ref 70–99)
Glucose-Capillary: 133 mg/dL — ABNORMAL HIGH (ref 70–99)
Glucose-Capillary: 141 mg/dL — ABNORMAL HIGH (ref 70–99)
Glucose-Capillary: 144 mg/dL — ABNORMAL HIGH (ref 70–99)
Glucose-Capillary: 148 mg/dL — ABNORMAL HIGH (ref 70–99)

## 2013-08-19 LAB — URINE CULTURE
Colony Count: NO GROWTH
Culture: NO GROWTH

## 2013-08-19 LAB — RESPIRATORY VIRUS PANEL
Adenovirus: NOT DETECTED
INFLUENZA B 1: NOT DETECTED
Influenza A H1: NOT DETECTED
Influenza A H3: NOT DETECTED
Influenza A: NOT DETECTED
Metapneumovirus: NOT DETECTED
PARAINFLUENZA 1 A: NOT DETECTED
Parainfluenza 2: NOT DETECTED
Parainfluenza 3: NOT DETECTED
RESPIRATORY SYNCYTIAL VIRUS B: NOT DETECTED
RHINOVIRUS: NOT DETECTED
Respiratory Syncytial Virus A: NOT DETECTED

## 2013-08-19 LAB — LACTIC ACID, PLASMA: LACTIC ACID, VENOUS: 1.4 mmol/L (ref 0.5–2.2)

## 2013-08-19 LAB — MAGNESIUM
Magnesium: 2 mg/dL (ref 1.5–2.5)
Magnesium: 2 mg/dL (ref 1.5–2.5)

## 2013-08-19 LAB — PRO B NATRIURETIC PEPTIDE: PRO B NATRI PEPTIDE: 674.2 pg/mL — AB (ref 0–125)

## 2013-08-19 MED ORDER — VITAL HIGH PROTEIN PO LIQD
1000.0000 mL | ORAL | Status: DC
  Filled 2013-08-19 (×2): qty 1000

## 2013-08-19 MED ORDER — FUROSEMIDE 10 MG/ML IJ SOLN
40.0000 mg | Freq: Four times a day (QID) | INTRAMUSCULAR | Status: AC
  Administered 2013-08-19 (×3): 40 mg via INTRAVENOUS
  Filled 2013-08-19 (×3): qty 4

## 2013-08-19 MED ORDER — PRO-STAT SUGAR FREE PO LIQD
30.0000 mL | Freq: Two times a day (BID) | ORAL | Status: DC
  Filled 2013-08-19 (×2): qty 30

## 2013-08-19 MED ORDER — FENTANYL CITRATE 0.05 MG/ML IJ SOLN
50.0000 ug | INTRAMUSCULAR | Status: DC | PRN
  Administered 2013-08-19 – 2013-08-26 (×7): 50 ug via INTRAVENOUS
  Filled 2013-08-19 (×8): qty 2

## 2013-08-19 MED ORDER — VITAL HIGH PROTEIN PO LIQD
1000.0000 mL | ORAL | Status: DC
  Administered 2013-08-19 – 2013-08-20 (×2): 1000 mL
  Administered 2013-08-20 – 2013-08-21 (×4)
  Administered 2013-08-21: 1000 mL
  Filled 2013-08-19 (×6): qty 1000

## 2013-08-19 MED ORDER — METHYLPREDNISOLONE SODIUM SUCC 40 MG IJ SOLR
40.0000 mg | Freq: Two times a day (BID) | INTRAMUSCULAR | Status: DC
  Administered 2013-08-19: 40 mg via INTRAVENOUS
  Filled 2013-08-19 (×3): qty 1

## 2013-08-19 NOTE — Progress Notes (Signed)
Name: [name] MRN: [id number] DOB: 1951-02-10    ADMISSION DATE:  08/18/2013 CONSULTATION DATE:  08/18/2013  REFERRING MD :  EDP PRIMARY SERVICE: PCCM  CHIEF COMPLAINT:  Hypoxic/Hypercarbic Resp Failure  BRIEF PATIENT DESCRIPTION: 63 yo morbidly obese [facility] pt with known hx of COPD , Chronic Resp, Failure , OSA/OHS Trach dependent brought to ER on 2/15 with increased dyspnea, hypoxia, fever and altered mental status found to be profoundly hypercarbic with PCO2~104, placed on vent . PCCM to admit .   SIGNIFICANT EVENTS / STUDIES:   LINES / TUBES: Chronic trach, changed 2/15 >>>  CULTURES: 2/15 BC x 2 >>  2/15 UC >>  2/15 Respiratory viral panel >>  ANTIBIOTICS: 2/15 Aztreonam >>  2/15 Vanc >>  SUBJECTIVE: did well overnight. On vent. Alert. Pink tinged secretions from trach.  VITAL SIGNS: Temp:  [98 F (36.7 C)-99.4 F (37.4 C)] 98.1 F (36.7 C) (02/16 0600) Pulse Rate:  [73-107] 73 (02/16 0210) Resp:  [14-32] 17 (02/16 0600) BP: (100-146)/(44-99) 139/68 mmHg (02/16 0600) SpO2:  [77 %-100 %] 95 % (02/16 0600) FiO2 (%):  [40 %-60 %] 40 % (02/16 0600) Weight:  [271 lb (122.925 kg)-303 lb 9.2 oz (137.7 kg)] 303 lb 9.2 oz (137.7 kg) (02/16 0345) HEMODYNAMICS:   VENTILATOR SETTINGS: Vent Mode:  [-] PRVC FiO2 (%):  [40 %-60 %] 40 % Set Rate:  [16 bmp] 16 bmp Vt Set:  [360 mL] 360 mL PEEP:  [5 cmH20] 5 cmH20 Plateau Pressure:  [19 cmH20-24 cmH20] 19 cmH20 INTAKE / OUTPUT: Intake/Output     02/15 0701 - 02/16 0700 02/16 0701 - 02/17 0700   I.V. (mL/kg) 810 (5.9)    IV Piggyback 350    Total Intake(mL/kg) 1160 (8.4)    Urine (mL/kg/hr) 485    Total Output 485     Net +675            PHYSICAL EXAMINATION: General: Morbidly obese , on vent  Neuro: alert, following commands  HEENT: Trach in place w/ pink tinged secretions  Cardiovascular: RRR , no m/r/g , + edema  Lungs: coarse BS  Abdomen: Obese , soft  Musculoskeletal: Intact  Skin: Left LE  dressing  LABS:  CBC  Recent Labs Lab 08/18/13 1230 08/19/13 0307  WBC 8.9 6.4  HGB 11.1* 10.6*  HCT 35.8* 34.4*  PLT 185 240   Coag's No results found for this basename: APTT, INR,  in the last 168 hours BMET  Recent Labs Lab 08/18/13 1230 08/19/13 0307  NA 144 142  K 4.8 4.9  CL 94* 94*  CO2 39* 38*  BUN 12 18  CREATININE 0.66 0.74  GLUCOSE 120* 153*   Electrolytes  Recent Labs Lab 08/18/13 1230 08/19/13 0307  CALCIUM 9.6 9.3   Sepsis Markers  Recent Labs Lab 08/18/13 1308 08/18/13 1955 08/19/13 0307  LATICACIDVEN 0.96  --  1.4  PROCALCITON  --  0.18  --    ABG  Recent Labs Lab 08/18/13 1327 08/19/13 0535  PHART 7.288* 7.318*  PCO2ART 104.2* 83.4*  PO2ART 69.0* 76.9*   Liver Enzymes  Recent Labs Lab 08/18/13 1230  AST 26  ALT 9  ALKPHOS 65  BILITOT <0.2*  ALBUMIN 3.5   Cardiac Enzymes  Recent Labs Lab 08/18/13 1230 08/18/13 1955 08/19/13 0307  TROPONINI  --  <0.30  --   PROBNP 932.0*  --  674.2*   Glucose  Recent Labs Lab 08/18/13 1722 08/18/13 1950 08/19/13 0017 08/19/13  0419  GLUCAP 138* 108* 144* 141*    Imaging Dg Chest Port 1 View  08/18/2013   CLINICAL DATA:  SHORTNESS OF BREATH  EXAM: PORTABLE CHEST - 1 VIEW  COMPARISON:  CT ANGIO CHEST W/CM &/OR WO/CM dated 07/06/2013; DG CHEST 2 VIEW dated 07/03/2013  FINDINGS: Low lung volumes. There is prominence of interstitial markings, peribronchial cuffing, and indistinctness of the pulmonary vasculature. An endotracheal tube is identified with tip at the level of clavicles. Metallic density having the appearance of spurring projects in the left lung apex likely reflecting an external device. The osseous structures unremarkable. Cardiac silhouette is enlarged.  IMPRESSION: Interstitial findings as described above likely reflecting pulmonary edema. Findings are accentuated by the patient's low lung volumes. No focal regions of consolidation appreciated.   Electronically Signed    By: Salome HolmesHector  Cooper M.D.   On: 08/18/2013 11:39   CXR: bilateral pulmonary edema, small lung volumes  ASSESSMENT / PLAN:  PULMONARY  A:  Acute on chronic hypoxemic / hypercarbic respiratory failure  OHS/OSA  COPD w/ exacerbation  Tracheostomy status  Mucus plug P:   Continuous mechanical support, FiO2 40% RR 16 TV 360 PEEP 5, increase rate (does not breath over all the time) Attempt SBT, cpap 5 , escalate PS 10-12 pcxr in am  Albuterol / Atrovent / Pulmicort  Solu-Medrol 80 q12h, reduce   Neg balance goals  CARDIOVASCULAR  A:  Chronic diastolic congestive heart failure  HTN   P:  Lasix 40 mg q6 x3 doses for volume overload on CXR tele   RENAL  A: No active issues  P:  Monitor and replace electrolytes as indicated  IVF@KVO  Lasix per above   GASTROINTESTINAL  A:  GI Px  P:  Protonix  NPO If not weaning well, place NGT  HEMATOLOGIC  A:  Anemia  VTE Px  P:  Monitor CBC Heparin   INFECTIOUS  A:  HCAP? Less likely given normal WBC and no fever COPD exacerbation  P:  PCT 0.18 - consider narrowing  for COPD exacerbation given PCT value and low clinical suspicion Abx / cx as above Repeat pct  The likely dc all abx Dc vanc  ENDOCRINE  A:  DM  P:  SSI   NEUROLOGIC  A:  Acute encephalopathy in setting of severe hypercarbia  Bipolar Dz  P:  Avoid Benzo if possible  Hold psych meds for now   Marikay AlarEric Sonnenberg, MD [facility] PGY-2  TODAY'S SUMMARY: lasix for volume, failed SBT, to sdu vent bed  I have personally obtained a history, examined the patient, evaluated laboratory and imaging results, formulated the assessment and plan and placed orders. CRITICAL CARE: The patient is critically ill with multiple organ systems failure and requires high complexity decision making for assessment and support, frequent evaluation and titration of therapies, application of advanced monitoring technologies and extensive interpretation of multiple  databases. Critical Care Time devoted to patient care services described in this note is 35 minutes.   Mcarthur Rossettianiel J. [name] AliasFeinstein, MD, FACP Pgr: [phone] [facility]  Pulmonary and Critical Care Medicine [facility] Pager: [phone]  08/19/2013, 7:17 AM

## 2013-08-19 NOTE — Progress Notes (Signed)
Interval Progress Note  Patient complaining of pain. States this is all over. She has this pain at home and is on medication for this, though is unable to state what she takes for this. Will start fentanyl 50 mcg IV q2 hr prn for pain.  Marikay AlarEric Kenyona Rena, MD [facility] PGY-2

## 2013-08-19 NOTE — Care Management Note (Signed)
    Page 1 of 1   08/19/2013     3:27:31 PM   CARE MANAGEMENT NOTE 08/19/2013  Patient:  [name],[name]   Account Number:  [id number]  Date Initiated:  08/19/2013  Documentation initiated by:  Avie ArenasBROWN,Geovanie Winnett  Subjective/Objective Assessment:   Resp failure - chronic trach patient.     Action/Plan:   Anticipated DC Date:  08/23/2013   Anticipated DC Plan:  SKILLED NURSING FACILITY  In-house referral  Clinical Social Worker      DC Planning Services  CM consult      Choice offered to / List presented to:             Status of service:  In process, will continue to follow Medicare Important Message given?   (If response is "NO", the following Medicare IM given date fields will be blank) Date Medicare IM given:   Date Additional Medicare IM given:    Discharge Disposition:    Per UR Regulation:  Reviewed for med. necessity/level of care/duration of stay  If discussed at Long Length of Stay Meetings, dates discussed:    Comments:  ContactVerlon Setting:  Brother [name] [phone]  08-19-13 3:20pm Avie ArenasSarah Anjela Cassara, RNBSN [phone] [facility] - brother concerned about care there but also not wanting to just change facilities as she has been there since 2005.  Encourged to talk with facility ADM and DON.  Referral made to SW.  Encouraged brother to let us know if he is interested in anther facility as SW can assist him with this process.

## 2013-08-19 NOTE — Progress Notes (Signed)
INITIAL NUTRITION ASSESSMENT  DOCUMENTATION CODES Per approved criteria  -Morbid Obesity   INTERVENTION:  Utilize 67M PEPuP Protocol:  Initiate TF via OGT with Vital High Protein at 25 ml/hr on day 1; on day 2, increase to goal rate of 60 ml/hr (1440 ml per day) to provide 1440 kcal (10.5 kcal/kg actual body weight), 126 grams protein, and 1189 ml free water daily.  NUTRITION DIAGNOSIS: Inadequate oral intake related to inability to eat as evidenced by NPO status.   Goal: Enteral nutrition to provide 60-70% of estimated calorie needs (11-14 kcal/kg actual body weight) and 100% of estimated protein needs, based on ASPEN guidelines for permissive underfeeding in critically ill obese individuals.   Monitor:  Vent status, TF initiation, weight trends, lab trends  Reason for Assessment: MD consult for TF management and initiation  63 y.o. female  Admitting Dx: Hypoxic/Hypercarbic Resp Failure  ASSESSMENT:  Patient is a morbidly obese [facility] pt with known hx of COPD , Chronic Resp, Failure , OSA/OHS Trach dependent brought to ER on 2/15 with increased dyspnea, hypoxia, fever and altered mental status found to be profoundly hypercarbic with PCO2~104, placed on vent.   Patient is currently intubated on ventilator support. MV:  5.6 L/min Temp (24hrs), Avg:98.7 F (37.1 C), Min:98 F (36.7 C), Max:99.4 F (37.4 C)   Nutrition Focused Physical Exam:  Subcutaneous Fat:  Orbital Region: WNL Upper Arm Region: WNL Thoracic and Lumbar Region: N/A  Muscle:  Temple Region: WNL Clavicle Bone Region: WNL Clavicle and Acromion Bone Region: WNL Scapular Bone Region: N/A Dorsal Hand: WNL Patellar Region: N/A Anterior Thigh Region: N/A Posterior Calf Region: N/A  Edema: none noted   Height: Ht Readings from Last 1 Encounters:  08/18/13 5' (1.524 m)    Weight: Wt Readings from Last 1 Encounters:  08/19/13 303 lb 9.2 oz (137.7 kg)    Ideal Body Weight: 100 lb (45.5 kg)  %  Ideal Body Weight: 303%  Wt Readings from Last 10 Encounters:  08/19/13 303 lb 9.2 oz (137.7 kg)  07/03/13 271 lb (122.925 kg)  07/01/13 277 lb (125.646 kg)  06/03/13 282 lb 10.1 oz (128.2 kg)  05/21/13 281 lb 8.4 oz (127.7 kg)  05/09/13 279 lb 15.8 oz (127 kg)  04/09/13 291 lb 0.1 oz (132 kg)  02/11/13 305 lb (138.347 kg)  01/23/13 283 lb 8 oz (128.595 kg)  11/12/12 302 lb 4 oz (137.1 kg)    Usual Body Weight: 277 lb  % Usual Body Weight: 109%  BMI:  Body mass index is 59.29 kg/(m^2).  Estimated Nutritional Needs: Kcal: 1765 Protein: 115-125 grams Fluid: >1.5 L  Skin: no wounds  Diet Order:  NPO  EDUCATION NEEDS: -Education not appropriate at this time   Intake/Output Summary (Last 24 hours) at 08/19/13 0916 Last data filed at 08/19/13 0800  Gross per 24 hour  Intake   1200 ml  Output    535 ml  Net    665 ml    Last BM: 2/14  Labs:   Recent Labs Lab 08/18/13 1230 08/19/13 0307  NA 144 142  K 4.8 4.9  CL 94* 94*  CO2 39* 38*  BUN 12 18  CREATININE 0.66 0.74  CALCIUM 9.6 9.3  GLUCOSE 120* 153*    CBG (last 3)   Recent Labs  08/19/13 0017 08/19/13 0419 08/19/13 0837  GLUCAP 144* 141* 133*    Scheduled Meds: . antiseptic oral rinse  15 mL Mouth Rinse QID  . aztreonam  2 g Intravenous 3 times per day  . budesonide (PULMICORT) nebulizer solution  0.25 mg Nebulization BID  . chlorhexidine  15 mL Mouth Rinse BID  . heparin  5,000 Units Subcutaneous 3 times per day  . influenza vac split quadrivalent PF  0.5 mL Intramuscular Tomorrow-1000  . insulin aspart  2-6 Units Subcutaneous 6 times per day  . ipratropium-albuterol  3 mL Nebulization Q6H  . methylPREDNISolone (SOLU-MEDROL) injection  80 mg Intravenous Q12H  . pantoprazole (PROTONIX) IV  40 mg Intravenous QHS  . pneumococcal 23 valent vaccine  0.5 mL Intramuscular Tomorrow-1000  . vancomycin  1,250 mg Intravenous Q12H    Continuous Infusions: . sodium chloride Stopped (08/18/13 1621)   . sodium chloride      Past Medical History  Diagnosis Date  . COPD   . Hypertension   . Chronic respiratory failure     3 L oxygen  . GERD (gastroesophageal reflux disease)   . Diabetes mellitus   . Obesity, Class III, BMI 40-49.9 (morbid obesity)   . Obstructive sleep apnea on CPAP   . Peripheral vascular disease   . Bipolar disease, chronic   . Lymphedema   . COPD (chronic obstructive pulmonary disease)   . Cellulitis   . Coronary artery disease   . Asthma   . Myocardial infarction   . Vertigo     Past Surgical History  Procedure Laterality Date  . Abdominal hysterectomy    . Cholecystectomy    . Appendectomy    . Tubal ligation    . Tracheostomy      Marlane Mingle, Dietetic Intern Pager: [phone]  I agree with student dietitian note; appropriate revisions have been made.  Joaquin Courts, RD, LDN, CNSC Pager# [phone] After Hours Pager# [phone]

## 2013-08-20 ENCOUNTER — Inpatient Hospital Stay (HOSPITAL_COMMUNITY): Payer: Medicaid Other

## 2013-08-20 DIAGNOSIS — J9600 Acute respiratory failure, unspecified whether with hypoxia or hypercapnia: Secondary | ICD-10-CM

## 2013-08-20 LAB — CBC
HEMATOCRIT: 33.5 % — AB (ref 36.0–46.0)
Hemoglobin: 10.5 g/dL — ABNORMAL LOW (ref 12.0–15.0)
MCH: 27.3 pg (ref 26.0–34.0)
MCHC: 31.3 g/dL (ref 30.0–36.0)
MCV: 87.2 fL (ref 78.0–100.0)
Platelets: 245 10*3/uL (ref 150–400)
RBC: 3.84 MIL/uL — ABNORMAL LOW (ref 3.87–5.11)
RDW: 15.6 % — AB (ref 11.5–15.5)
WBC: 5.3 10*3/uL (ref 4.0–10.5)

## 2013-08-20 LAB — BASIC METABOLIC PANEL
BUN: 28 mg/dL — AB (ref 6–23)
CO2: 43 mEq/L (ref 19–32)
Calcium: 9.6 mg/dL (ref 8.4–10.5)
Chloride: 92 mEq/L — ABNORMAL LOW (ref 96–112)
Creatinine, Ser: 0.82 mg/dL (ref 0.50–1.10)
GFR calc Af Amer: 86 mL/min — ABNORMAL LOW (ref >90)
GFR, EST NON AFRICAN AMERICAN: 74 mL/min — AB (ref >90)
Glucose, Bld: 132 mg/dL — ABNORMAL HIGH (ref 70–99)
POTASSIUM: 4.1 meq/L (ref 3.7–5.3)
Sodium: 145 mEq/L (ref 137–147)

## 2013-08-20 LAB — GLUCOSE, CAPILLARY
GLUCOSE-CAPILLARY: 141 mg/dL — AB (ref 70–99)
GLUCOSE-CAPILLARY: 141 mg/dL — AB (ref 70–99)
GLUCOSE-CAPILLARY: 145 mg/dL — AB (ref 70–99)
Glucose-Capillary: 124 mg/dL — ABNORMAL HIGH (ref 70–99)
Glucose-Capillary: 146 mg/dL — ABNORMAL HIGH (ref 70–99)
Glucose-Capillary: 107 mg/dL — ABNORMAL HIGH (ref 70–99)
Glucose-Capillary: 131 mg/dL — ABNORMAL HIGH (ref 70–99)

## 2013-08-20 LAB — PHOSPHORUS
PHOSPHORUS: 3.2 mg/dL (ref 2.3–4.6)
Phosphorus: 3.7 mg/dL (ref 2.3–4.6)

## 2013-08-20 LAB — MAGNESIUM
MAGNESIUM: 1.8 mg/dL (ref 1.5–2.5)
Magnesium: 1.9 mg/dL (ref 1.5–2.5)

## 2013-08-20 LAB — PROCALCITONIN: Procalcitonin: 0.14 ng/mL

## 2013-08-20 MED ORDER — PROPOFOL 10 MG/ML IV EMUL
INTRAVENOUS | Status: AC
  Filled 2013-08-20: qty 100

## 2013-08-20 MED ORDER — PROPOFOL 10 MG/ML IV EMUL
INTRAVENOUS | Status: AC
  Administered 2013-08-20: 1000 mg
  Filled 2013-08-20: qty 100

## 2013-08-20 MED ORDER — METHYLPREDNISOLONE SODIUM SUCC 40 MG IJ SOLR
40.0000 mg | Freq: Every day | INTRAMUSCULAR | Status: DC
  Administered 2013-08-20: 40 mg via INTRAVENOUS
  Filled 2013-08-20 (×3): qty 1

## 2013-08-20 MED ORDER — FUROSEMIDE 10 MG/ML IJ SOLN
40.0000 mg | Freq: Four times a day (QID) | INTRAMUSCULAR | Status: AC
  Administered 2013-08-20 (×2): 40 mg via INTRAVENOUS
  Filled 2013-08-20 (×2): qty 4

## 2013-08-20 MED ORDER — DEXTROSE 5 % IV SOLN
2.0000 g | Freq: Three times a day (TID) | INTRAVENOUS | Status: DC
  Administered 2013-08-20 – 2013-08-21 (×3): 2 g via INTRAVENOUS
  Filled 2013-08-20 (×9): qty 2

## 2013-08-20 MED ORDER — ACETYLCYSTEINE 20 % IN SOLN
3.0000 mL | Freq: Four times a day (QID) | RESPIRATORY_TRACT | Status: DC
  Administered 2013-08-20 (×2): 3 mL via RESPIRATORY_TRACT
  Administered 2013-08-21: 4 mL via RESPIRATORY_TRACT
  Administered 2013-08-21: 3 mL via RESPIRATORY_TRACT
  Administered 2013-08-22 (×2): 4 mL via RESPIRATORY_TRACT
  Filled 2013-08-20 (×13): qty 4

## 2013-08-20 MED ORDER — PROPOFOL 10 MG/ML IV BOLUS
INTRAVENOUS | Status: AC
  Filled 2013-08-20: qty 20

## 2013-08-20 NOTE — Progress Notes (Signed)
Pt seen for trach eval at this time. Pt remains on full vent support. Will continue to follow to eval pt progress.

## 2013-08-20 NOTE — Progress Notes (Signed)
Name: [name] MRN: [id number] DOB: 01/26/1951    ADMISSION DATE:  08/18/2013 CONSULTATION DATE:  08/18/2013  REFERRING MD :  EDP PRIMARY SERVICE: PCCM  CHIEF COMPLAINT:  Hypoxic/Hypercarbic Resp Failure  BRIEF PATIENT DESCRIPTION: 63 yo morbidly obese [facility] pt with known hx of COPD , Chronic Resp, Failure , OSA/OHS Trach dependent brought to ER on 2/15 with increased dyspnea, hypoxia, fever and altered mental status found to be profoundly hypercarbic with PCO2~104, placed on vent . PCCM to admit .   SIGNIFICANT EVENTS / STUDIES:  2/15 trach replaced  LINES / TUBES: Chronic trach, changed 2/15 >>>  CULTURES: 2/15 BC x 2 >>  2/15 UC >> neg 2/15 Respiratory viral panel >> neg  ANTIBIOTICS: 2/15 Aztreonam >>  2/15 Vanc >> 2/16  SUBJECTIVE: did well overnight. On vent. Weaned all day yesterday.  VITAL SIGNS: Temp:  [98.1 F (36.7 C)-99.4 F (37.4 C)] 98.9 F (37.2 C) (02/17 0600) Pulse Rate:  [80-102] 93 (02/17 0600) Resp:  [16-39] 18 (02/17 0600) BP: (94-150)/(50-122) 126/61 mmHg (02/17 0600) SpO2:  [96 %-100 %] 99 % (02/17 0600) FiO2 (%):  [40 %] 40 % (02/17 0333) Weight:  [132.2 kg (291 lb 7.2 oz)] 132.2 kg (291 lb 7.2 oz) (02/17 0402) HEMODYNAMICS:   VENTILATOR SETTINGS: Vent Mode:  [-] PRVC FiO2 (%):  [40 %] 40 % Set Rate:  [16 bmp-18 bmp] 18 bmp Vt Set:  [360 mL] 360 mL PEEP:  [5 cmH20] 5 cmH20 Pressure Support:  [15 cmH20] 15 cmH20 Plateau Pressure:  [20 cmH20-23 cmH20] 22 cmH20 INTAKE / OUTPUT: Intake/Output     02/16 0701 - 02/17 0700 02/17 0701 - 02/18 0700   I.V. (mL/kg) 200 (1.5)    NG/GT 482.5    IV Piggyback 150    Total Intake(mL/kg) 832.5 (6.3)    Urine (mL/kg/hr) 2120 (0.7)    Total Output 2120     Net -1287.5            PHYSICAL EXAMINATION: General: Morbidly obese , on vent  Neuro: alert, following commands, looks scared HEENT: Trach in place without apparent mucus  Cardiovascular: RRR, no m/r/g , + edema  Lungs: coarse  BS  Abdomen: Obese , soft, NT Musculoskeletal: no noted deformities Skin: Left LE dressing  LABS:  CBC  Recent Labs Lab 08/18/13 1230 08/19/13 0307 08/20/13 0520  WBC 8.9 6.4 5.3  HGB 11.1* 10.6* 10.5*  HCT 35.8* 34.4* 33.5*  PLT 185 240 245   Coag's No results found for this basename: APTT, INR,  in the last 168 hours BMET  Recent Labs Lab 08/18/13 1230 08/19/13 0307 08/20/13 0520  NA 144 142 145  K 4.8 4.9 4.1  CL 94* 94* 92*  CO2 39* 38* 43*  BUN 12 18 28*  CREATININE 0.66 0.74 0.82  GLUCOSE 120* 153* 132*   Electrolytes  Recent Labs Lab 08/18/13 1230 08/19/13 0307 08/19/13 2243 08/20/13 0520  CALCIUM 9.6 9.3  --  9.6  MG  --  2.0 2.0  --   PHOS  --  4.3 3.8  --    Sepsis Markers  Recent Labs Lab 08/18/13 1308 08/18/13 1955 08/19/13 0307 08/20/13 0250  LATICACIDVEN 0.96  --  1.4  --   PROCALCITON  --  0.18  --  0.14   ABG  Recent Labs Lab 08/18/13 1327 08/19/13 0535  PHART 7.288* 7.318*  PCO2ART 104.2* 83.4*  PO2ART 69.0* 76.9*   Liver Enzymes  Recent Labs Lab  08/18/13 1230  AST 26  ALT 9  ALKPHOS 65  BILITOT <0.2*  ALBUMIN 3.5   Cardiac Enzymes  Recent Labs Lab 08/18/13 1230 08/18/13 1955 08/19/13 0307  TROPONINI  --  <0.30  --   PROBNP 932.0*  --  674.2*   Glucose  Recent Labs Lab 08/19/13 0837 08/19/13 1122 08/19/13 1509 08/19/13 1900 08/20/13 0009 08/20/13 0343  GLUCAP 133* 127* 148* 120* 141* 124*    Imaging Dg Abd 1 View  08/19/2013   CLINICAL DATA:  To confirm a NG tube placement  EXAM: ABDOMEN - 1 VIEW  COMPARISON:  Abdomen film from 05/04/2013  FINDINGS: The tip of the NG tube is in the region of the distal antrum -duodenum bulb. The bowel gas pattern is nonspecific. Surgical clips are present from prior cholecystectomy. Linear opacity at the left lung base is most consistent with atelectasis.  IMPRESSION: Tip of NG tube near the distal antrum -duodenal bulb region.   Electronically Signed   By: Dwyane Dee M.D.   On: 08/19/2013 13:16   Dg Chest Port 1 View  08/19/2013   CLINICAL DATA:  Evaluate tracheostomy tube positioning  EXAM: PORTABLE CHEST - 1 VIEW  COMPARISON:  DG CHEST 1V PORT dated 08/18/2013; DG CHEST 1V PORT dated 07/05/2013; CT ANGIO CHEST W/CM &/OR WO/CM dated 07/06/2013; DG CHEST 2 VIEW dated 07/03/2013  FINDINGS: Grossly unchanged enlarged cardiac silhouette and mediastinal contours given persistently reduced lung volumes. Stable positioning of support apparatus. No definite pneumothorax. Pulmonary vasculature is indistinct with cephalization of flow. Worsening perihilar and medial basilar heterogeneous opacities. Suspected increase in small/trace bilateral pleural effusions. Grossly unchanged bones.  IMPRESSION: 1. Stable positioning of support apparatus.  No pneumothorax. 2. Persistently reduced lung volumes with findings suggestive of worsening edema and bilateral atelectasis. 3. Suspected increase in small/trace bilateral effusions.   Electronically Signed   By: Simonne Come M.D.   On: 08/19/2013 07:46   Dg Chest Port 1 View  08/18/2013   CLINICAL DATA:  SHORTNESS OF BREATH  EXAM: PORTABLE CHEST - 1 VIEW  COMPARISON:  CT ANGIO CHEST W/CM &/OR WO/CM dated 07/06/2013; DG CHEST 2 VIEW dated 07/03/2013  FINDINGS: Low lung volumes. There is prominence of interstitial markings, peribronchial cuffing, and indistinctness of the pulmonary vasculature. An endotracheal tube is identified with tip at the level of clavicles. Metallic density having the appearance of spurring projects in the left lung apex likely reflecting an external device. The osseous structures unremarkable. Cardiac silhouette is enlarged.  IMPRESSION: Interstitial findings as described above likely reflecting pulmonary edema. Findings are accentuated by the patient's low lung volumes. No focal regions of consolidation appreciated.   Electronically Signed   By: Salome Holmes M.D.   On: 08/18/2013 11:39   CXR: continued rll  collapse  ASSESSMENT / PLAN:  PULMONARY  A:  Acute on chronic hypoxemic / hypercarbic respiratory failure  OHS/OSA  COPD w/ exacerbation  Tracheostomy status  Mucus plug with continued rll collapse? P:   Continuous mechanical support, FiO2 40% RR 18 TV 360 PEEP 5 Attempt SBT, cpap 5 , escalate PS 10-12 pcxr in am  Albuterol / Atrovent / Pulmicort  Decrease solumedrol to 40 daily Neg balance goals -1.28 L yesterday Consider bronch with day 3 with RLL collapse and needs PS required still Add chest PT, mucomysts  CARDIOVASCULAR  A:  Chronic diastolic congestive heart failure  HTN   P:  Continue lasix 40 mg reduce frequency tele   RENAL  A: No  active issues  P:  Monitor and replace electrolytes as indicated  IVF@KVO  Lasix per above , reduce  GASTROINTESTINAL  A:  GI Px  P:  Protonix  NPO for bronch No peg, as swallowed prior with trach  HEMATOLOGIC  A:  Anemia  VTE Px  P:  Monitor CBC Heparin   INFECTIOUS  A:  HCAP? Less likely given normal WBC and no fever COPD exacerbation  P:  PCT 0.18 - repeat 0.14, await bronch result, keep ABX for now F/u cultures  ENDOCRINE  A:  DM  P:  SSI   NEUROLOGIC  A:  Acute encephalopathy in setting of severe hypercarbia  Bipolar Dz  P:  Avoid Benzo if possible  Hold psych meds until taking PO  Marikay AlarEric Sonnenberg, MD [facility] PGY-2  TODAY'S SUMMARY: lasix for volume again today, bronch for continued collapse rll  I have personally obtained a history, examined the patient, evaluated laboratory and imaging results, formulated the assessment and plan and placed orders. CRITICAL CARE: The patient is critically ill with multiple organ systems failure and requires high complexity decision making for assessment and support, frequent evaluation and titration of therapies, application of advanced monitoring technologies and extensive interpretation of multiple databases. Critical Care Time devoted to  patient care services described in this note is 30 minutes.   Mcarthur Rossettianiel J. [name] AliasFeinstein, MD, FACP Pgr: [phone] [facility]  Pulmonary and Critical Care Medicine [facility] Pager: [phone]  08/20/2013, 7:27 AM

## 2013-08-20 NOTE — Progress Notes (Signed)
CRITICAL VALUE ALERT  Critical value received:  CO2 43  Date of notification:  08/20/13  Time of notification:  705  Critical value read back:yes  Nurse who received alert:  Lamount CrankerKelli Hunt  MD notified (1st page):  Marikay AlarEric Sonnenberg  Time of first page:  705  MD notified (2nd page):  Time of second page:  Responding MD:  Marikay AlarEric Sonnenberg  Time MD responded:  [phone]

## 2013-08-20 NOTE — Procedures (Signed)
Bronchoscopy Procedure Note Alice DanielsCarolyn R Cyran [id number] 11/21/1950  Procedure: Bronchoscopy Indications: Diagnostic evaluation of the airways, Obtain specimens for culture and/or other diagnostic studies and Remove secretions  Procedure Details Consent: Risks of procedure as well as the alternatives and risks of each were explained to the (patient/caregiver).  Consent for procedure obtained. Time Out: Verified patient identification, verified procedure, site/side was marked, verified correct patient position, special equipment/implants available, medications/allergies/relevent history reviewed, required imaging and test results available.  Performed  In preparation for procedure, patient was given 100% FiO2 and bronchoscope lubricated. Sedation: propofol bolus x 2  Airway entered and the following bronchi were examined: RUL, RML, RLL and Bronchi.   Procedures performed: Brushings performed Bronchoscope removed.  , Patient placed back on 100% FiO2 at conclusion of procedure.    Evaluation Hemodynamic Status: BP stable throughout; O2 sats: stable throughout Patient's Current Condition: stable Specimens:  Sent purulent fluid Complications: No apparent complications Patient did tolerate procedure well.   FEINSTEIN,DANIEL J.  1. Large mucous plug removed from BI, pus thick, suctioned 2. 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 [name] AliasFeinstein, MD, FACP Pgr: [phone] [facility]  08/20/2013

## 2013-08-20 NOTE — Clinical Social Work Psychosocial (Signed)
Clinical Social Work Department BRIEF PSYCHOSOCIAL ASSESSMENT 08/20/2013  Patient:  [name],[name]     Account Number:  [id number]     Admit date:  08/18/2013  Clinical Social Worker:  Read DriversINGLE,Jaycee Mckellips, LCSWA  Date/Time:  08/20/2013 10:31 AM  Referred by:  Physician  Date Referred:  08/20/2013 Referred for  SNF Placement   Other Referral:   none   Interview type:  Other - See comment Other interview type:   Daughter, Aurea GraffJoan, [place]    PSYCHOSOCIAL DATA Living Status:  FACILITY Admitted from facility[facility]  [facility] Level of care:  Skilled Nursing Facility Primary support name:  Aurea GraffJoan Primary support relationship to patient:  CHILD, ADULT Degree of support available:   adequate    CURRENT CONCERNS Current Concerns  Other - See comment  Post-Acute Placement   Other Concerns:   Trach care at facility    SOCIAL WORK ASSESSMENT / PLAN Pt is intubated/trached and able to follow commands.  Pt is unable to participate in assessment.  Daughter, Aurea GraffJoan, is POA.  CSW spoke with daughter who reports that [place]  Pt went in in 2005 for STR and converted shortly after to LTC.  [facility] and [facility] [place] and have a working relationship with Director of Nursing there.    Per RNCM report and chart review, upon [facility], pt trach was uncared for: "trach was clogged and kinked and had to replace the inner canula and suctions for large amount thick green mucus".    CSW discussed trach care with daughter.  Aurea GraffJoan reports that she has discussed trach care with Dewayne HatchAnn, the director of nursing [place].  Dewayne Hatchnn reports to daughter that [name] respitory therapist on staff to care for trach patients. CSW has contacted Weston SettleAnn Ingram, [facility] and left message.  CSW awaiting a return call.    Pt daughter is frustrated regarding the level of care received at SNF concerning the trach.  Pt daughter feels like  she should be doing more, but cannot.  Pt daughter feels that she is asking all the right questions and still not getting good results at SNF.  CSW offered emotional support and encouragement to daughter.  Aurea GraffJoan says it has been hard to experience the pt gradual decline in health and be unable to return home.  Daughter states when the trach was placed back in October of last year, she lost all hope for pt's independence.    Aurea GraffJoan is requesting a SNF search for a long term care trach bed for pt.  Aurea GraffJoan is aware that if no LTC beds are available that pt would  [facility] where Aurea GraffJoan would then be able to place pt on waiting list for transfer.  CSW also suggested trach education for Aurea GraffJoan and the pt brother (who reportedly shares POA).  Aurea GraffJoan is agreeable.   Assessment/plan status:  Psychosocial Support/Ongoing Assessment of Needs Other assessment/ plan:   none   Information/referral to community resources:   trach care education  SNF- LTC    PATIENT'S/FAMILY'S RESPONSE TO PLAN OF CARE: Aurea GraffJoan was very appreciative of CSW assistance and support. Aurea GraffJoan is agreeable to plan of care.  SNF search for possible transfer (LTC) or [facility] with plans to transfer from there.  Aurea GraffJoan is also agreeable to trach care education.       Vickii PennaGina Millissa Deese, LCSWA [phone]  Clinical  Social Work

## 2013-08-21 DIAGNOSIS — E1149 Type 2 diabetes mellitus with other diabetic neurological complication: Secondary | ICD-10-CM

## 2013-08-21 LAB — PROCALCITONIN: PROCALCITONIN: 0.18 ng/mL

## 2013-08-21 LAB — PHOSPHORUS
PHOSPHORUS: 2.4 mg/dL (ref 2.3–4.6)
Phosphorus: 2 mg/dL — ABNORMAL LOW (ref 2.3–4.6)

## 2013-08-21 LAB — GLUCOSE, CAPILLARY
GLUCOSE-CAPILLARY: 124 mg/dL — AB (ref 70–99)
GLUCOSE-CAPILLARY: 140 mg/dL — AB (ref 70–99)
Glucose-Capillary: 115 mg/dL — ABNORMAL HIGH (ref 70–99)
Glucose-Capillary: 116 mg/dL — ABNORMAL HIGH (ref 70–99)
Glucose-Capillary: 134 mg/dL — ABNORMAL HIGH (ref 70–99)
Glucose-Capillary: 118 mg/dL — ABNORMAL HIGH (ref 70–99)

## 2013-08-21 LAB — MAGNESIUM
Magnesium: 1.7 mg/dL (ref 1.5–2.5)
Magnesium: 1.7 mg/dL (ref 1.5–2.5)

## 2013-08-21 MED ORDER — POTASSIUM CHLORIDE 10 MEQ/100ML IV SOLN: 10.0000 meq | INTRAVENOUS | Status: AC

## 2013-08-21 MED ORDER — FUROSEMIDE 10 MG/ML IJ SOLN: 40.0000 mg | Freq: Three times a day (TID) | INTRAMUSCULAR | Status: AC

## 2013-08-21 NOTE — Progress Notes (Signed)
CRITICAL VALUE ALERT  Critical value received:  Gm (-) rods, poly and mono  Date of notification:  08/21/2013   Time of notification:  0855  Critical value read back yes  Nurse who received alert: Janna Archata  MD notified (1st page):  Mr. Celine MansDesai, [place]  Time of first page:  1000  MD notified (2nd page):  Time of second page:  Responding MD:  Mr. Celine MansDesai, [place]  Time MD responded:  Verbally informed

## 2013-08-21 NOTE — Progress Notes (Signed)
RT Note: Pt refused CPT & HHN. Pt wrote that she wants to call daughter to pick her up. Pt wants to talk to MD before taking treatments. RT will attempt again at later time.

## 2013-08-21 NOTE — Progress Notes (Signed)
Speech-Language Pathology Note  Chart review complete as part of tracheostomy team.  Appears that patient weaning from vent support. Patient may benefit from PMSV evaluation once fully weaned.  SLP will continue to f/u. MD, please order PMSV evaluation if/when patient becomes appropriate.  Ferdinand LangoLeah Roylene Heaton MA, CCC-SLP [phone]

## 2013-08-21 NOTE — Progress Notes (Signed)
Pt has remained off vent and on 35% ATC without complication. HR 80-90, RR 18-22, Sats 95-98%. Pt able to clear secretions. No distress noted. RT will continue to monitor.

## 2013-08-21 NOTE — Progress Notes (Signed)
t0800. Patient is confused stating that  We are trying to hurt her and some short white girls is trying to keep her downstairs.  She is able to communicate by covering her trach.  Trach cuff has been deflated. She knows her name, the place where she is and she knew her daughter's name.  [id number].Noted that patient is holding something in her hand and it was the IV.  She broke the IV lines, IV is out and has been fighting with the staff.  She grabbed the nurse tech and the nurse and she has been kicking.    1100.  Dr. Bjorn LoserYacuob was paged and informed him of this combative behavior.  Ordered for bilateral wrist restraint was obtained and applied.   Pt was put back to bed for central line placement.  Everything was explained by the PA and the daughter also spoke with the patient prior to this procedure. She verbalized understanding.   During the procedure, patient started to get agitated and started to be uncooperative.  I explained to her that it is almost done but she started grabbing me and  pulling the drapes off.When she grabbed me, the MD and PA was at the bedside and they witnessed this incident.  This was during the central line insertion.  Dr. Herma CarsonZ stated that if she don't want to have an IV access, we can not forced her.  Central line procedure failed, no IV access and daughter, Aurea GraffJoan, was called and informed her of this incident.

## 2013-08-21 NOTE — Progress Notes (Signed)
Name: [name] MRN: [id number] DOB: Sep 16, 1950    ADMISSION DATE:  08/18/2013 CONSULTATION DATE:  08/18/2013  REFERRING MD :  EDP PRIMARY SERVICE: PCCM  CHIEF COMPLAINT:  Hypoxic/Hypercarbic Resp Failure  BRIEF PATIENT DESCRIPTION: 63 yo morbidly obese [facility] pt with known hx of COPD , Chronic Resp, Failure , OSA/OHS Trach dependent brought to ER on 2/15 with increased dyspnea, hypoxia, fever and altered mental status found to be profoundly hypercarbic with PCO2~104, placed on vent . PCCM to admit .   SIGNIFICANT EVENTS / STUDIES:  2/15 trach replaced 2/17 Tx to SDU  LINES / TUBES: Chronic trach, changed 2/15 >>>  CULTURES: 2/15 BC x 2 >>  2/15 UC >> neg 2/15 Respiratory viral panel >> neg 2/15 sputum>>SA, pseudomonas>>  ANTIBIOTICS: 2/15 Aztreonam >>  2/15 Vanc >> 2/16  SUBJECTIVE:  In SDU. Very anxious, now in restraints. Very agitated.  Non-sensical when speaking.  VITAL SIGNS: Temp:  [98.3 F (36.8 C)-99.1 F (37.3 C)] 99.1 F (37.3 C) (02/18 0738) Pulse Rate:  [85-97] 97 (02/18 0844) Resp:  [16-31] 22 (02/18 0844) BP: (99-157)/(62-98) 150/85 mmHg (02/18 0844) SpO2:  [94 %-98 %] 96 % (02/18 0844) FiO2 (%):  [10 %-40 %] 10 % (02/18 0844) Weight:  [129 kg (284 lb 6.3 oz)] 129 kg (284 lb 6.3 oz) (02/18 0438) HEMODYNAMICS:   VENTILATOR SETTINGS: Vent Mode:  [-]  FiO2 (%):  [10 %-40 %] 10 % INTAKE / OUTPUT: Intake/Output     02/17 0701 - 02/18 0700 02/18 0701 - 02/19 0700   I.V. (mL/kg) 231.7 (1.8)    NG/GT 930    IV Piggyback 100    Total Intake(mL/kg) 1261.7 (9.8)    Urine (mL/kg/hr) 3495 (1.1) 600 (1.1)   Total Output 3495 600   Net -2233.3 -600          PHYSICAL EXAMINATION: General: Morbidly obese , on t-collar  Neuro: alert, following commands, looks scared(hx of bipolar) HEENT: Trach in place without apparent mucus  Cardiovascular: RRR, no m/r/g , + edema  Lungs: coarse BS  Abdomen: Obese , soft, NT Musculoskeletal: no noted  deformities Skin: Left LE dressing, lower ext venous stasis changes  LABS:  CBC  Recent Labs Lab 08/18/13 1230 08/19/13 0307 08/20/13 0520  WBC 8.9 6.4 5.3  HGB 11.1* 10.6* 10.5*  HCT 35.8* 34.4* 33.5*  PLT 185 240 245   Coag's No results found for this basename: APTT, INR,  in the last 168 hours BMET  Recent Labs Lab 08/18/13 1230 08/19/13 0307 08/20/13 0520  NA 144 142 145  K 4.8 4.9 4.1  CL 94* 94* 92*  CO2 39* 38* 43*  BUN 12 18 28*  CREATININE 0.66 0.74 0.82  GLUCOSE 120* 153* 132*   Electrolytes  Recent Labs Lab 08/18/13 1230 08/19/13 0307  08/20/13 0520 08/20/13 2138 08/21/13 0850  CALCIUM 9.6 9.3  --  9.6  --   --   MG  --  2.0  < > 1.9 1.8 1.7  PHOS  --  4.3  < > 3.7 3.2 2.4  < > = values in this interval not displayed. Sepsis Markers  Recent Labs Lab 08/18/13 1308 08/18/13 1955 08/19/13 0307 08/20/13 0250 08/21/13 0407  LATICACIDVEN 0.96  --  1.4  --   --   PROCALCITON  --  0.18  --  0.14 0.18   ABG  Recent Labs Lab 08/18/13 1327 08/19/13 0535  PHART 7.288* 7.318*  PCO2ART 104.2* 83.4*  PO2ART 69.0* 76.9*   Liver Enzymes  Recent Labs Lab 08/18/13 1230  AST 26  ALT 9  ALKPHOS 65  BILITOT <0.2*  ALBUMIN 3.5   Cardiac Enzymes  Recent Labs Lab 08/18/13 1230 08/18/13 1955 08/19/13 0307  TROPONINI  --  <0.30  --   PROBNP 932.0*  --  674.2*   Glucose  Recent Labs Lab 08/20/13 1527 08/20/13 1644 08/20/13 1929 08/21/13 0029 08/21/13 0441 08/21/13 0816  GLUCAP 145* 141* 131* 124* 115* 116*    Imaging Dg Abd 1 View  08/19/2013   CLINICAL DATA:  To confirm a NG tube placement  EXAM: ABDOMEN - 1 VIEW  COMPARISON:  Abdomen film from 05/04/2013  FINDINGS: The tip of the NG tube is in the region of the distal antrum -duodenum bulb. The bowel gas pattern is nonspecific. Surgical clips are present from prior cholecystectomy. Linear opacity at the left lung base is most consistent with atelectasis.  IMPRESSION: Tip of NG  tube near the distal antrum -duodenal bulb region.   Electronically Signed   By: Dwyane DeePaul  Barry M.D.   On: 08/19/2013 13:16   Dg Chest Port 1 View  08/20/2013   CLINICAL DATA:  Right lower lobe collapse. Status post bronchoscopy.  EXAM: PORTABLE CHEST - 1 VIEW  COMPARISON:  Earlier today.  FINDINGS: The examination is limited by suboptimal technique. Tracheostomy tube in satisfactory position. No gross change in bilateral airspace opacity. Stable enlarged cardiac silhouette and prominent pulmonary vasculature. Nasogastric tube extending into the stomach. Thoracic spine degenerative changes.  IMPRESSION: Limited examination with no gross change in bilateral atelectasis or pneumonia, cardiomegaly and pulmonary vascular congestion. No gross pneumothorax following bronchoscopy.   Electronically Signed   By: Gordan PaymentSteve  Reid M.D.   On: 08/20/2013 12:42   Dg Chest Port 1 View  08/20/2013   CLINICAL DATA:  Shortness of breath.  No intubated.  EXAM: PORTABLE CHEST - 1 VIEW  COMPARISON:  Yesterday.  FINDINGS: Tracheostomy tube in satisfactory position. Nasogastric tube extending into the stomach. Stable enlarged cardiac silhouette. No significant change in bilateral airspace opacity. Decreased prominence of the interstitial markings. Stable prominence of the pulmonary vasculature. Thoracic spine and right shoulder degenerative changes.  IMPRESSION: 1. No significant change in bilateral atelectasis or pneumonia. 2. Stable cardiomegaly and pulmonary vascular congestion. 3. Improved interstitial pulmonary edema.   Electronically Signed   By: Gordan PaymentSteve  Reid M.D.   On: 08/20/2013 07:55   CXR: none for 2/18  ASSESSMENT / PLAN:  PULMONARY  A:  Acute on chronic hypoxemic / hypercarbic respiratory failure  OHS/OSA  COPD w/ exacerbation  Tracheostomy status  Mucus plug with continued rll collapse? P:   - Change to TC as tolerated. - BD - Decreased solumedrol to 40 daily 2/17 - Neg balance goals. - Add chest PT,  mucomysts.  CARDIOVASCULAR  A:  Chronic diastolic congestive heart failure  HTN   P:  - Lasix 40 mg IV q8 x2 doses. - BMET in AM. - Replace electrolytes as indicated.  RENAL  A: No active issues  P:  - Monitor and replace electrolytes as indicated. - IVF@KVO . - Lasix per above.  GASTROINTESTINAL  A:  GI Px  P:  - Protonix. - Swallow evaluation. - Resume diet if passes.  HEMATOLOGIC  A:  Anemia  VTE Px  P:  - Monitor CBC. - Heparin.  INFECTIOUS  A:  HCAP? Less likely given normal WBC and no fever COPD exacerbation  P:  - PCT 0.18 - repeat  0.14, await bronch result, keep ABX for now - F/u cultures  ENDOCRINE  A:  DM  P:  - SSI.  NEUROLOGIC  A:  Acute encephalopathy in setting of severe hypercarbia  Bipolar Dz  P:  - Avoid Benzo if possible. - Hold psych meds until taking PO.  TODAY'S SUMMARY: Swallow evaluation ordered.  Once able to take PO then will need to restart diet and psych medications.  Two doses of lasix.  Keep off vent as tolerated.  Will need placement.  Patient seen and examined, agree with above note.  I dictated the care and orders written for this patient under my direction.  Alyson Reedy, MD [phone]

## 2013-08-21 NOTE — Progress Notes (Signed)
Patient pulled her IV out and refused to have a new IV access.  Dr. Bjorn LoserYacuob is paged and made aware of patient's request to talk to him.

## 2013-08-21 NOTE — Progress Notes (Signed)
RT Note: RT called to room, RN bagging pt. Pt desat, put back on vent, suctioned large thick yellow secretions. Pt tolerating well at this time, RT to monitor.

## 2013-08-21 NOTE — Progress Notes (Signed)
I was called to place an IV in this pt, I arrived to find that the pt was very anxious and combative.  I attempted to position the pt to start the IV when she grabbed all of the tubing in the bed (b/p cuff, old IV tubing, NG tubing and oxygen) and refused to allow me to move the tubing.  Pt's combativeness began to increase, eventually tearing the IV tubing in half.  I called Marshell Garfinkelata RN to the bedside and informed her it was not safe to start an IV on her at this time.  Consuello Masseimmons, Dyllan Hughett M

## 2013-08-21 NOTE — Clinical Social Work Note (Signed)
CSW spoke with Weston SettleAnn [name], Director of Nursing [facility] [place] regarding [place] trach care.  [name] stated that the patient is a great advocate for herself regarding needing trach care/cleaning.  [name] states that she is unaware of how the trach could have been kinked since the pt trach had not been changed yet.  [name] reports that it must have been kinked when the trach was changed.  [name] is aware of the trach issues and is willing to address such issues with medical staff.  Alice and medical staff are meeting with the daughter at 11:30am for a previously scheduled care meeting.  Pt has transferred from [facility] to [facility].  2C unit CSW aware of the transfer and this CSW has given report. 88M CSW signing off.  [name], LCSWA [phone]  Clinical Social Work

## 2013-08-21 NOTE — Progress Notes (Signed)
I was called back to start an IV on this pt since her daughter was at the bedside and she's sitting up in the chair.  She eventually agreed to allow me to start an IV.  Two attempts were made both were unsuccessful.   After thanking the pt for her cooperation she than attempted to hit me with the call bell several times.  It is my conclusion that is pt's intravenous access should be attempted by a physician, it's not safe for a bedside nurse to start an IV. Tata RN informed. Consuello Masseimmons, Gearld Kerstein M

## 2013-08-21 NOTE — Progress Notes (Signed)
RT Note: Pt is refusing all respiratory therapy including treatments & chest pt. When asked if she was in pain, pt responded yes. RN notified, RT to monitor. 

## 2013-08-21 NOTE — Clinical Social Work Note (Signed)
CSW spoke at length with [name] regarding trach care pt is receiving at the facility.  CSW reported to [name] that pt has had 5 ED visits within the past 6 months and with most of the visits: the trach has not been appropriately cleaned/cared for at the SNF. [name] requested that I speak with the Director of Nursing, [name] regarding this patient care issue.  CSW left a message with the receptionist for [name].  CSW is awaiting a return call.  [name], LCSWA [phone]  Clinical Social Work

## 2013-08-21 NOTE — Progress Notes (Signed)
CSW called pt's daughter to introduce self and explain CSW has been given a handoff from pt's previous CSW. CSW explained clinicals were faxed to the other SNFs that provide trach care [place] [facility] [place], and that once pt's trach settings are at appropriate level for SNF we will know if she can go to a different facility.    Maryclare LabradorJulie Loralye Loberg, MSW, [facility] Clinical Social Worker [phone]

## 2013-08-21 NOTE — Progress Notes (Signed)
ANTIBIOTIC CONSULT NOTE - Follow-up  Pharmacy Consult for Aztreonam Indication: pneumonia  Allergies  Allergen Reactions  . Penicillins Anaphylaxis    Tongue swelling  . Ace Inhibitors Other (See Comments)    Per MAR  . Aspirin Other (See Comments)    Per MAR  . Bee Venom Other (See Comments)    Unknown  . Lisinopril     Per MAR  . Nyquil Cold & [Dm-Doxylamine-Acetaminophen] Other (See Comments)    Unknown   . Ppd [Tuberculin Purified Protein Derivative] Other (See Comments)    unknown    Patient Measurements: Height: 5' (152.4 cm) Weight: 284 lb 6.3 oz (129 kg) IBW/kg (Calculated) : 45.5  Vital Signs: Temp: 99.1 F (37.3 C) (02/18 0738) Temp src: Oral (02/18 0738) BP: 112/86 mmHg (02/18 0738) Pulse Rate: 93 (02/18 0738) Intake/Output from previous day: 02/17 0701 - 02/18 0700 In: 1261.7 [I.V.:231.7; NG/GT:930; IV Piggyback:100] Out: 9371 [Urine:3495] Intake/Output from this shift: Total I/O In: -  Out: 600 [Urine:600]  Labs:  Recent Labs  08/18/13 1230 08/19/13 0307 08/20/13 0520  WBC 8.9 6.4 5.3  HGB 11.1* 10.6* 10.5*  PLT 185 240 245  CREATININE 0.66 0.74 0.82   Estimated Creatinine Clearance: 86.3 ml/min (by C-G formula based on Cr of 0.82). No results found for this basename: VANCOTROUGH, Corlis Leak, VANCORANDOM, GENTTROUGH, GENTPEAK, GENTRANDOM, TOBRATROUGH, TOBRAPEAK, TOBRARND, AMIKACINPEAK, AMIKACINTROU, AMIKACIN,  in the last 72 hours   Microbiology: Recent Results (from the past 720 hour(s))  CULTURE, BLOOD (ROUTINE X 2)     Status: None   Collection Time    08/18/13 12:30 PM      Result Value Ref Range Status   Specimen Description BLOOD RIGHT WRIST   Final   Special Requests BOTTLES DRAWN AEROBIC AND ANAEROBIC 2CC   Final   Culture  Setup Time     Final   Value: 08/18/2013 18:31     Performed at [facility]   Culture     Final   Value:        BLOOD CULTURE RECEIVED NO GROWTH TO DATE CULTURE WILL BE HELD FOR 5 DAYS BEFORE  ISSUING A FINAL NEGATIVE REPORT     Performed at [facility]   Report Status PENDING   Incomplete  RESPIRATORY VIRUS PANEL     Status: None   Collection Time    08/18/13  5:29 PM      Result Value Ref Range Status   Source - RVPAN TRACHEAL ASPIRATE   Final   Respiratory Syncytial Virus A NOT DETECTED   Final   Respiratory Syncytial Virus B NOT DETECTED   Final   Influenza A NOT DETECTED   Final   Influenza B NOT DETECTED   Final   Parainfluenza 1 NOT DETECTED   Final   Parainfluenza 2 NOT DETECTED   Final   Parainfluenza 3 NOT DETECTED   Final   Metapneumovirus NOT DETECTED   Final   Rhinovirus NOT DETECTED   Final   Adenovirus NOT DETECTED   Final   Influenza A H1 NOT DETECTED   Final   Influenza A H3 NOT DETECTED   Final   Comment: (NOTE)           Normal Reference Range for each Analyte: NOT DETECTED     Testing performed using the Luminex xTAG Respiratory Viral Panel test     kit.     This test was developed and its performance characteristics determined     by  [facility]. It has not been cleared or approved by the [place]     Food and Drug Administration. This test is used for clinical purposes.     It should not be regarded as investigational or for research. This     laboratory is certified under the [place] (CLIA) as qualified to perform high complexity     clinical laboratory testing.     Performed at [facility]     Status: None   Collection Time    08/18/13  5:30 PM      Result Value Ref Range Status   Specimen Description URINE, CATHETERIZED   Final   Special Requests NONE   Final   Culture  Setup Time     Final   Value: 08/19/2013 01:46     Performed at [place]     Final   Value: NO GROWTH     Performed at [facility]   Culture     Final   Value: NO GROWTH     Performed at [facility]   Report Status 08/19/2013 FINAL   Final   CULTURE, RESPIRATORY (NON-EXPECTORATED)     Status: None   Collection Time    08/18/13  5:30 PM      Result Value Ref Range Status   Specimen Description TRACHEAL ASPIRATE   Final   Special Requests NONE   Final   Gram Stain     Final   Value: MODERATE WBC PRESENT, PREDOMINANTLY PMN     RARE SQUAMOUS EPITHELIAL CELLS PRESENT     NO ORGANISMS SEEN     Performed at [facility]   Culture     Final   Value: Culture reincubated for better growth     Performed at [facility]   Report Status PENDING   Incomplete  MRSA PCR SCREENING     Status: None   Collection Time    08/18/13  5:31 PM      Result Value Ref Range Status   MRSA by PCR NEGATIVE  NEGATIVE Final   Comment:            The GeneXpert MRSA Assay (FDA     approved for NASAL specimens     only), is one component of a     comprehensive MRSA colonization     surveillance program. It is not     intended to diagnose MRSA     infection nor to guide or     monitor treatment for     MRSA infections.  CULTURE, BLOOD (ROUTINE X 2)     Status: None   Collection Time    08/18/13  7:55 PM      Result Value Ref Range Status   Specimen Description BLOOD LEFT ARM   Final   Special Requests BOTTLES DRAWN AEROBIC ONLY 5CC   Final   Culture  Setup Time     Final   Value: 08/19/2013 01:43     Performed at [facility]   Culture     Final   Value:        BLOOD CULTURE RECEIVED NO GROWTH TO DATE CULTURE WILL BE HELD FOR 5 DAYS BEFORE ISSUING A FINAL NEGATIVE REPORT     Performed at [facility]   Report Status PENDING   Incomplete    Assessment: 63yo female trach  dependent on Aztreonam Day#4 for HCAP. PCT 0.18->0.14. Afeb. Wbc wnl  2/15 Bldx2: NGTD 2/15 flu panel: neg 2/15 urine: neg 2/15 TA: NGTD 2/17 bronch: sen  Goal of Therapy:  Resolution of infection  Plan:  1) Azactam 2gm IV q8. Note due 2/21. 2) F/U cx, renal fxn, LOT  Sherlon Handing, PharmD, BCPS Clinical pharmacist, pager  [phone] 08/21/2013  8:49 AM

## 2013-08-22 ENCOUNTER — Inpatient Hospital Stay (HOSPITAL_COMMUNITY): Payer: Medicaid Other

## 2013-08-22 LAB — CULTURE, RESPIRATORY

## 2013-08-22 LAB — CBC
HCT: 38.4 % (ref 36.0–46.0)
Hemoglobin: 11.9 g/dL — ABNORMAL LOW (ref 12.0–15.0)
MCH: 27.2 pg (ref 26.0–34.0)
MCHC: 31 g/dL (ref 30.0–36.0)
MCV: 87.7 fL (ref 78.0–100.0)
PLATELETS: 266 10*3/uL (ref 150–400)
RBC: 4.38 MIL/uL (ref 3.87–5.11)
RDW: 15.8 % — AB (ref 11.5–15.5)
WBC: 23.3 10*3/uL — ABNORMAL HIGH (ref 4.0–10.5)

## 2013-08-22 LAB — GLUCOSE, CAPILLARY
GLUCOSE-CAPILLARY: 115 mg/dL — AB (ref 70–99)
GLUCOSE-CAPILLARY: 116 mg/dL — AB (ref 70–99)
GLUCOSE-CAPILLARY: 128 mg/dL — AB (ref 70–99)
GLUCOSE-CAPILLARY: 187 mg/dL — AB (ref 70–99)
Glucose-Capillary: 141 mg/dL — ABNORMAL HIGH (ref 70–99)
Glucose-Capillary: 111 mg/dL — ABNORMAL HIGH (ref 70–99)
Glucose-Capillary: 145 mg/dL — ABNORMAL HIGH (ref 70–99)

## 2013-08-22 LAB — BASIC METABOLIC PANEL
BUN: 28 mg/dL — ABNORMAL HIGH (ref 6–23)
BUN: 26 mg/dL — ABNORMAL HIGH (ref 6–23)
CALCIUM: 9.6 mg/dL (ref 8.4–10.5)
CALCIUM: 7.8 mg/dL — AB (ref 8.4–10.5)
CO2: 43 meq/L — AB (ref 19–32)
CO2: 38 mEq/L — ABNORMAL HIGH (ref 19–32)
CREATININE: 0.77 mg/dL (ref 0.50–1.10)
Chloride: 93 mEq/L — ABNORMAL LOW (ref 96–112)
Chloride: 98 mEq/L (ref 96–112)
Creatinine, Ser: 0.77 mg/dL (ref 0.50–1.10)
GFR calc Af Amer: >90 mL/min (ref >90)
GFR calc non Af Amer: 87 mL/min — ABNORMAL LOW (ref >90)
GFR, EST NON AFRICAN AMERICAN: 87 mL/min — AB (ref >90)
Glucose, Bld: 106 mg/dL — ABNORMAL HIGH (ref 70–99)
Glucose, Bld: 126 mg/dL — ABNORMAL HIGH (ref 70–99)
Potassium: 3 mEq/L — ABNORMAL LOW (ref 3.7–5.3)
Potassium: 3.1 mEq/L — ABNORMAL LOW (ref 3.7–5.3)
SODIUM: 145 meq/L (ref 137–147)
Sodium: 147 mEq/L (ref 137–147)

## 2013-08-22 LAB — MAGNESIUM: MAGNESIUM: 1.8 mg/dL (ref 1.5–2.5)

## 2013-08-22 LAB — CULTURE, RESPIRATORY W GRAM STAIN

## 2013-08-22 LAB — PHOSPHORUS: Phosphorus: 2.7 mg/dL (ref 2.3–4.6)

## 2013-08-22 MED ORDER — PREDNISONE 20 MG PO TABS
40.0000 mg | ORAL_TABLET | Freq: Every day | ORAL | Status: DC
  Administered 2013-08-23 – 2013-08-24 (×2): 40 mg via ORAL
  Filled 2013-08-22 (×3): qty 2

## 2013-08-22 MED ORDER — DULOXETINE HCL 60 MG PO CPEP
60.0000 mg | ORAL_CAPSULE | Freq: Two times a day (BID) | ORAL | Status: DC
  Administered 2013-08-22 – 2013-08-27 (×10): 60 mg via ORAL
  Filled 2013-08-22 (×11): qty 1

## 2013-08-22 MED ORDER — DIVALPROEX SODIUM ER 250 MG PO TB24
250.0000 mg | ORAL_TABLET | Freq: Two times a day (BID) | ORAL | Status: DC
  Administered 2013-08-22 – 2013-08-27 (×10): 250 mg via ORAL
  Filled 2013-08-22 (×11): qty 1

## 2013-08-22 MED ORDER — SODIUM CHLORIDE 0.9 % IV SOLN
500.0000 mg | Freq: Four times a day (QID) | INTRAVENOUS | Status: DC
  Administered 2013-08-22 – 2013-08-27 (×19): 500 mg via INTRAVENOUS
  Filled 2013-08-22 (×22): qty 500

## 2013-08-22 MED ORDER — POTASSIUM CHLORIDE CRYS ER 20 MEQ PO TBCR
40.0000 meq | EXTENDED_RELEASE_TABLET | Freq: Two times a day (BID) | ORAL | Status: AC
Start: 2013-08-22 — End: 2013-08-22
  Administered 2013-08-22 (×2): 40 meq via ORAL
  Filled 2013-08-22 (×2): qty 2

## 2013-08-22 MED ORDER — POTASSIUM CHLORIDE 20 MEQ/15ML (10%) PO LIQD
40.0000 meq | ORAL | Status: AC
  Administered 2013-08-22 (×2): 40 meq
  Filled 2013-08-22 (×2): qty 30

## 2013-08-22 MED ORDER — ARIPIPRAZOLE 10 MG PO TABS
10.0000 mg | ORAL_TABLET | Freq: Every day | ORAL | Status: DC
  Administered 2013-08-23 – 2013-08-27 (×5): 10 mg via ORAL
  Filled 2013-08-22 (×5): qty 1

## 2013-08-22 MED ORDER — ACETAMINOPHEN 650 MG RE SUPP
650.0000 mg | Freq: Once | RECTAL | Status: AC
  Administered 2013-08-22: 650 mg via RECTAL
  Filled 2013-08-22: qty 1

## 2013-08-22 MED ORDER — FUROSEMIDE 10 MG/ML IJ SOLN
40.0000 mg | Freq: Two times a day (BID) | INTRAMUSCULAR | Status: AC
Start: 2013-08-22 — End: 2013-08-23
  Administered 2013-08-22 – 2013-08-23 (×2): 40 mg via INTRAVENOUS
  Filled 2013-08-22 (×2): qty 4

## 2013-08-22 MED ORDER — PANTOPRAZOLE SODIUM 40 MG PO TBEC
40.0000 mg | DELAYED_RELEASE_TABLET | Freq: Every day | ORAL | Status: DC
  Administered 2013-08-22 – 2013-08-27 (×6): 40 mg via ORAL
  Filled 2013-08-22 (×6): qty 1

## 2013-08-22 MED ORDER — BUSPIRONE HCL 10 MG PO TABS
10.0000 mg | ORAL_TABLET | Freq: Two times a day (BID) | ORAL | Status: DC
  Administered 2013-08-22 – 2013-08-27 (×10): 10 mg via ORAL
  Filled 2013-08-22 (×12): qty 1

## 2013-08-22 MED ORDER — SODIUM CHLORIDE 0.9 % IV SOLN
500.0000 mg | Freq: Four times a day (QID) | INTRAVENOUS | Status: DC
  Administered 2013-08-22: 500 mg via INTRAVENOUS
  Filled 2013-08-22 (×4): qty 500

## 2013-08-22 NOTE — Progress Notes (Signed)
Pharmacy note: antibiotic choice  The latest respiratory culture from the 2/15 trach aspirate shows MSSA and pseudomonas.  She has an anaphylactic reaction reported to penicillin.  However the patient tolerated Imipenem from 04/03/13 through 04/08/13.  Both of her latest isolates are sensitive to imipenem.  I paged Danford BadKathryn Whiteheart and received orders to change aztreonam to Imipenem 500mg  IV q6h.  Celedonio MiyamotoJeremy Hillary Struss, PharmD, BCPS Clinical Pharmacist Pager [phone]

## 2013-08-22 NOTE — Progress Notes (Signed)
[place] PCCM    Name: [name] MRN: [id number] DOB: 08-03-1950    ADMISSION DATE:  08/18/2013 CONSULTATION DATE:  08/18/2013  REFERRING MD :  EDP PRIMARY SERVICE: PCCM  CHIEF COMPLAINT:  Hypoxic/Hypercarbic Resp Failure  BRIEF PATIENT DESCRIPTION: 63 yo morbidly obese [facility] pt with known hx of COPD , Chronic Resp, Failure , OSA/OHS Trach dependent brought to ER on 2/15 with increased dyspnea, hypoxia, fever and altered mental status found to be profoundly hypercarbic with PCO2~104, placed on vent . PCCM to admit .   SIGNIFICANT EVENTS / STUDIES:  2/15 trach replaced 2/17 Tx to SDU  LINES / TUBES: Chronic trach, changed 2/15 >>>  CULTURES: 2/15 BC x 2 >>  2/15 UC >> neg 2/15 Respiratory viral panel >> neg 2/15 sputum>>SA, pseudomonas>>  2/17 R pleural fluid >>>> abundant pseudomonas, mod staph  ANTIBIOTICS: 2/15 Aztreonam >>  2/15 Vanc >> 2/16  SUBJECTIVE:  In SDU. Calmer today.  With sitter.  Awaiting IR PICC Line.   VITAL SIGNS: Temp:  [97.9 F (36.6 C)-102 F (38.9 C)] 99.6 F (37.6 C) (02/19 1200) Pulse Rate:  [36-120] 113 (02/19 1200) Resp:  [18-30] 24 (02/19 1200) BP: (126-187)/(52-114) 126/66 mmHg (02/19 1200) SpO2:  [92 %-100 %] 94 % (02/19 1200) FiO2 (%):  [10 %-100 %] 40 % (02/19 1200) Weight:  [284 lb 6.3 oz (129 kg)] 284 lb 6.3 oz (129 kg) (02/19 0411) HEMODYNAMICS:   VENTILATOR SETTINGS: Vent Mode:  [-] PRVC FiO2 (%):  [10 %-100 %] 40 % Set Rate:  [18 bmp] 18 bmp Vt Set:  [360 mL] 360 mL PEEP:  [5 cmH20] 5 cmH20 Plateau Pressure:  [15 cmH20-22 cmH20] 20 cmH20 INTAKE / OUTPUT: Intake/Output     02/18 0701 - 02/19 0700 02/19 0701 - 02/20 0700   P.O.  180   I.V. (mL/kg)     NG/GT 420    IV Piggyback     Total Intake(mL/kg) 420 (3.3) 180 (1.4)   Urine (mL/kg/hr) 2875 (0.9) 250 (0.3)   Total Output 2875 250   Net -2455 -70          PHYSICAL EXAMINATION: General: Morbidly obese , NAD on ATC Neuro: alert, following commands,  anxious HEENT: Trach in place without apparent mucus  Cardiovascular: RRR, no m/r/g , + edema  Lungs: coarse BS  Abdomen: Obese , soft, NT Musculoskeletal: no noted deformities Skin: Left LE dressing, lower ext venous stasis changes  LABS:  CBC  Recent Labs Lab 08/19/13 0307 08/20/13 0520 08/22/13 0302  WBC 6.4 5.3 23.3*  HGB 10.6* 10.5* 11.9*  HCT 34.4* 33.5* 38.4  PLT 240 245 266   Coag's No results found for this basename: APTT, INR,  in the last 168 hours BMET  Recent Labs Lab 08/19/13 0307 08/20/13 0520 08/22/13 0302  NA 142 145 147  K 4.9 4.1 3.0*  CL 94* 92* 93*  CO2 38* 43* 43*  BUN 18 28* 28*  CREATININE 0.74 0.82 0.77  GLUCOSE 153* 132* 106*   Electrolytes  Recent Labs Lab 08/19/13 0307  08/20/13 0520  08/21/13 0850 08/21/13 2113 08/22/13 0302  CALCIUM 9.3  --  9.6  --   --   --  9.6  MG 2.0  < > 1.9  < > 1.7 1.7 1.8  PHOS 4.3  < > 3.7  < > 2.4 2.0* 2.7  < > = values in this interval not displayed. Sepsis Markers  Recent Labs Lab 08/18/13 1308 08/18/13 1955 08/19/13  0307 08/20/13 0250 08/21/13 0407  LATICACIDVEN 0.96  --  1.4  --   --   PROCALCITON  --  0.18  --  0.14 0.18   ABG  Recent Labs Lab 08/18/13 1327 08/19/13 0535  PHART 7.288* 7.318*  PCO2ART 104.2* 83.4*  PO2ART 69.0* 76.9*   Liver Enzymes  Recent Labs Lab 08/18/13 1230  AST 26  ALT 9  ALKPHOS 65  BILITOT <0.2*  ALBUMIN 3.5   Cardiac Enzymes  Recent Labs Lab 08/18/13 1230 08/18/13 1955 08/19/13 0307  TROPONINI  --  <0.30  --   PROBNP 932.0*  --  674.2*   Glucose  Recent Labs Lab 08/21/13 1739 08/21/13 2011 08/22/13 0047 08/22/13 0427 08/22/13 0818 08/22/13 1159  GLUCAP 118* 140* 141* 115* 111* 116*    Imaging Dg Chest Port 1 View  08/22/2013   CLINICAL DATA:  Tracheostomy, shortness of breath.  EXAM: PORTABLE CHEST - 1 VIEW  COMPARISON:  None.  FINDINGS: Tracheostomy tube is unchanged. NG tube is in the stomach. Diffuse right lung  airspace opacities. Left base atelectasis or infiltrate. Mild vascular congestion and cardiomegaly. Moderate right pleural effusion.  IMPRESSION: Bilateral airspace opacities, right greater than left with moderate right effusion.  Cardiomegaly with vascular congestion.  No significant change.   Electronically Signed   By: Charlett Nose M.D.   On: 08/22/2013 05:53   CXR: none for 2/18  ASSESSMENT / PLAN:  PULMONARY  A:  Acute on chronic hypoxemic / hypercarbic respiratory failure  OHS/OSA  COPD w/ exacerbation  Tracheostomy status  Mucus plug with continued rll collapse? P:   - Continue ATC as tol  - BD - change to PO prednisone 2/19 - Cont to keep dry as tol  - chest PT as tol  - d/c mucomyst 2/19 - mobilize - PT/OT  CARDIOVASCULAR  A:  Chronic diastolic congestive heart failure  HTN   P:  - Cont gentle diuresis as bp, SCr tol  - f/u chem  - Replace electrolytes as indicated. -monitor BP with diuresis - may need to resume home norvasc   RENAL  A: No active issues  P:  - Monitor and replace electrolytes as indicated. - IVF@KVO . - Lasix as above.  GASTROINTESTINAL  A:  GI Px  Dysphagia -- passed swallow eval 2/19 - Dysphagia 1 diet.  P:  - Protonix. - Swallow evaluation. - Resume diet   HEMATOLOGIC  A:  Anemia  VTE Px  P:  - Monitor CBC. - Heparin.  INFECTIOUS  A:  HCAP - pseudomonas/ staph.  PCN allergic.    COPD exacerbation  P:  - Cont aztreonam for now  - F/u cultures  ENDOCRINE  A:  DM  P:  - SSI.  NEUROLOGIC  A:  Acute encephalopathy in setting of severe hypercarbia  Bipolar Dz  P:  - Avoid Benzo if possible. - Resume psych meds   TODAY'S SUMMARY:   Awaiting IR PICC line for IV abx.  Continue aggressive pulmonary hygiene and monitor off mucomyst nebs.  Resume Diet per speech recs. Cont ATC as tol.    Will need SNF placement. Will ask [facility] to assume care 2/20.     Danford Bad, NP 08/22/2013  12:47 PM Pager: [phone]  or [phone]  Will keep in SDU, transfer care to [facility], PCCM will be available PRN.  *Care during the described time interval was provided by me and/or other providers on the critical care team. I have reviewed this patient's available data,  including medical history, events of note, physical examination and test results as part of my evaluation.  Alyson ReedyWesam G. Vinayak Bobier, M.D. [facility]. Pager: [phone]. After hours pager: [phone].

## 2013-08-22 NOTE — Evaluation (Addendum)
Clinical/Bedside Swallow Evaluation Patient Details  Name: [name] MRN: [id number] Date of Birth: 02/14/1951  Today's Date: 08/22/2013 Time: [id number] SLP Time Calculation (min): 21 min  Past Medical History:  Past Medical History  Diagnosis Date  . COPD   . Hypertension   . Chronic respiratory failure     3 L oxygen  . GERD (gastroesophageal reflux disease)   . Diabetes mellitus   . Obesity, Class III, BMI 40-49.9 (morbid obesity)   . Obstructive sleep apnea on CPAP   . Peripheral vascular disease   . Bipolar disease, chronic   . Lymphedema   . COPD (chronic obstructive pulmonary disease)   . Cellulitis   . Coronary artery disease   . Asthma   . Myocardial infarction   . Vertigo    Past Surgical History:  Past Surgical History  Procedure Laterality Date  . Abdominal hysterectomy    . Cholecystectomy    . Appendectomy    . Tubal ligation    . Tracheostomy     HPI:  63 yo morbidly obese [facility] pt with known hx of COPD , Chronic Resp, Failure , OSA/OHS Trach dependent brought to ER on 2/15 with increased dyspnea, hypoxia, fever and altered mental status found to be profoundly hypercarbic. Janina Mayorach was changed, pt put on vent. Suspected mucus plug with continued RLL collapse. Pt known to this service, has had FEES demosntrating mild oral and oropharyngeal dysphagia; mehcanical soft diet and thin liqudis with small sips, straws restricted recommended. Has tolerated this die tover the last several visit and borther reprots she does well at home. She does not know where her PMSV is, but she wears it during all waking hours.     Assessment / Plan / Recommendation Clinical Impression  Pt presents with function consistent with prior assessments. Timing and strength with swallow appear adequate. Pt is known to have slight oral dysphagia with solids and dentures are not currently present, so will recommend Pureed solids for today until family brings dentures. No evidence of  aspiration with thin, Recommend continued restriction to small sips consistent with FEES recommendation due to occasional flash penetration. Will f/u tomorrow for final check of tolerance and diet upgrade.     Aspiration Risk  Mild    Diet Recommendation Dysphagia 1 (Puree);Thin liquid   Liquid Administration via: Cup;No straw Medication Administration: Whole meds with puree Supervision: Patient able to self feed;Full supervision/cueing for compensatory strategies Compensations: Slow rate;Small sips/bites Postural Changes and/or Swallow Maneuvers: Seated upright 90 degrees    Other  Recommendations Oral Care Recommendations: Oral care BID Other Recommendations: Place PMSV during PO intake   Follow Up Recommendations  None    Frequency and Duration min 1 x/week  2 weeks   Pertinent Vitals/Pain NA    SLP Swallow Goals     Swallow Study Prior Functional Status       General HPI: 63 yo morbidly obese [facility] pt with known hx of COPD , Chronic Resp, Failure , OSA/OHS Trach dependent brought to ER on 2/15 with increased dyspnea, hypoxia, fever and altered mental status found to be profoundly hypercarbic. Janina Mayorach was changed, pt put on vent. Suspected mucus plug with continued RLL collapse. Pt known to this service, has had FEES demosntrating mild oral and oropharyngeal dysphagia; mehcanical soft diet and thin liqudis with small sips, straws restricted recommended. Has tolerated this die tover the last several visit and borther reprots she does well at home. She does not know where her  PMSV is, but she wears it during all waking hours.   Type of Study: Bedside swallow evaluation Previous Swallow Assessment: FEES Diet Prior to this Study: NPO Temperature Spikes Noted: No Respiratory Status: Trach collar Trach Size and Type: #6;Cuff;Deflated;With PMSV in place History of Recent Intubation: No Behavior/Cognition: Alert;Cooperative Oral Cavity - Dentition: Edentulous;Dentures, not  available Self-Feeding Abilities: Able to feed self Patient Positioning: Upright in bed Baseline Vocal Quality: Clear Volitional Swallow: Able to elicit    Oral/Motor/Sensory Function Overall Oral Motor/Sensory Function: Appears within functional limits for tasks assessed   Ice Chips Ice chips: Within functional limits   Thin Liquid Thin Liquid: Within functional limits    Nectar Thick Nectar Thick Liquid: Not tested   Honey Thick Honey Thick Liquid: Not tested   Puree Puree: Within functional limits   Solid   GO    Solid: Not tested       Diannie Willner, Riley Nearing 08/22/2013,11:43 AM

## 2013-08-22 NOTE — Progress Notes (Signed)
Pt is resting in bed with vent on. Pt has a sitter at bedside.  Received report from night nurse that nurse removed NG tube d/t it leaking and a piece had broken. Pt also has no iv access and night nurse stated md aware and there was attempts at a central line yesterday. Pt is to have a swallow evaluation this am. Nurse just called and spoke with speech therapy about when she could come to do speech evaluation as pt has no NG tube for feedings and medication and she sated she would come as soon as she could and would make pt a high priority. Depending on result nurse will call and update MD and go from that point to get orders for pt's path of care.

## 2013-08-22 NOTE — Progress Notes (Addendum)
Clinical Social Work Department CLINICAL SOCIAL WORK PLACEMENT NOTE 08/22/2013  Patient:  Alice DanielsFLORENCE,Syna R  Account Number:  [id number] Admit date:  08/18/2013  Clinical Social Worker:  Maryclare LabradorJULIE Natanel Snavely, Theresia MajorsLCSWA  Date/time:  08/21/2013 01:30 PM  Clinical Social Work is seeking post-discharge placement for this patient at the following level of care:   SKILLED NURSING   (*CSW will update this form in Epic as items are completed)   N/A-daughter agreed to SNF search for all trach SNFs [place]  Patient/family provided with [facility] Department of Clinical Social Work's list of facilities offering this level of care within the geographic area requested by the patient (or if unable, by the patient's family).  08/21/2013  Patient/family informed of their freedom to choose among providers that offer the needed level of care, that participate in Medicare, Medicaid or managed care program needed by the patient, have an available bed and are willing to accept the patient.  N/A-clincials not sent to this facility  Patient/family informed of [facility]' ownership interest in [facility], as well as of the fact that they are under no obligation to receive care at this facility.  PASARR submitted to EDS on Existing PASARR number received from EDS on Existing  FL2 transmitted to all facilities in geographic area requested by pt/family on  08/21/2013 FL2 transmitted to all facilities within larger geographic area on   Patient informed that his/her managed care company has contracts with or will negotiate with  certain facilities, including the following:     Patient/family informed of bed offers received:  08/21/13 (informed [facility] and [name] said no because pt is on vent--CSW explained [facility] also will be unable to take pt until she is off vent and trach settings meet trach SNF requirements. CSW will follow up  with [facility] when pt trach settings are 28% or lower and will provide daughter additional bed offers if pt has them when trach settings are 28% or lower)   08/23/13--pt's brother requested bed offers and explained he and pt's daughter share POA. CSW printed sheet [facility] and [facility] addresses and phone numbers and explained these facilities said "yes" as long as pt is at 28% trach or lower. Brother to follow up with facility/facilities at his discretion and ask about their trach care and ability to take pt. CSW explained CSW will have to re-submit clinicals to both facilities when pt is closer to discharge and at 28% trach. [facility] and patient chooses bed at    [facility]  [facility]  The following physician request were entered in Epic:   Additional Comments: [facility] picked up pt. RN called report. CSW called pt's brother to inform him of transfer. CSW signing off.    Maryclare LabradorJulie Xsavier Seeley, MSW, [facility] Clinical Social Worker [phone]

## 2013-08-22 NOTE — Progress Notes (Signed)
eLink Physician-Brief Progress Note Patient Name: [name] DOB: 03/11/1951 MRN: [id number]  Date of Service  08/22/2013   HPI/Events of Note  Hypokalemia  eICU Interventions  Potassium replaced   Intervention Category Intermediate Interventions: Electrolyte abnormality - evaluation and management  Landers Prajapati 08/22/2013, 4:39 AM

## 2013-08-22 NOTE — Progress Notes (Signed)
Dr. Darrick Pennaeterding notified, patient found on floor, vital signs stable, will to continue to monitor.

## 2013-08-22 NOTE — Progress Notes (Signed)
Patient did not receive chest pt as this time due to fall. Patient is resting. All vitals within normal limits. Will try again at 0400.

## 2013-08-22 NOTE — Progress Notes (Signed)
Pt is leaving to go to Interventional radiology to have a Picc line placed per md orders. Pt has sitter going with transport to monitor her while she is transporting and waiting to make sure she does not try to get out of bed or pull out trach. Pt has been calm and cooperative this am since her brother was her and spoke with her. Called and notified central telemetry.

## 2013-08-22 NOTE — Evaluation (Signed)
Passy-Muir Speaking Valve - Evaluation Patient Details  Name: [name] MRN: [id number] Date of Birth: 03/09/1951  Today's Date: 08/22/2013 Time: [id number] SLP Time Calculation (min): 21 min  Past Medical History:  Past Medical History  Diagnosis Date  . COPD   . Hypertension   . Chronic respiratory failure     3 L oxygen  . GERD (gastroesophageal reflux disease)   . Diabetes mellitus   . Obesity, Class III, BMI 40-49.9 (morbid obesity)   . Obstructive sleep apnea on CPAP   . Peripheral vascular disease   . Bipolar disease, chronic   . Lymphedema   . COPD (chronic obstructive pulmonary disease)   . Cellulitis   . Coronary artery disease   . Asthma   . Myocardial infarction   . Vertigo    Past Surgical History:  Past Surgical History  Procedure Laterality Date  . Abdominal hysterectomy    . Cholecystectomy    . Appendectomy    . Tubal ligation    . Tracheostomy     HPI:  63 yo morbidly obese [facility] SN[facility] pt with known hx of COPD , Chronic Resp, Failure , OSA/OHS Trach dependent brought to ER on 2/15 with increased dyspnea, hypoxia, fever and altered mental status found to be profoundly hypercarbic. [name] was changed, pt put on vent. Suspected mucus plug with continued RLL collapse. Pt known to this service, has had FEES demosntrating mild oral and oropharyngeal dysphagia; mehcanical soft diet and thin liqudis with small sips, straws restricted recommended. Has tolerated this die tover the last several visit and borther reprots she does well at home. She does not know where her PMSV is, but she wears it during all waking hours.     Assessment / Plan / Recommendation Clinical Impression  Pt demonstrates immediate tolerance of PMSV with phonation consistent with baseline (abnormal resonance?). Pt verbalized orientation x4, verbalized PMSV precautions and demonstrated placement and removal independently. She does currently have a cuffed trach which necesitates further  education. Recommend today pt wears PMSV during all waking hours with full supervision (pt has sitter). Will f/u tomorrow to check for tolerance and further pt/family education. Unsure if pt will continue to need nocturnal vent and cuffed trach.     SLP Assessment  Patient needs continued Speech Lanaguage Pathology Services    Follow Up Recommendations       Frequency and Duration min 1 x/week  2 weeks   Pertinent Vitals/Pain NA    SLP Goals Potential to Achieve Goals: Good   PMSV Trial  PMSV was placed for: 10 minutes Able to redirect subglottic air through upper airway: Yes Able to Attain Phonation: Yes Voice Quality: Normal Able to Expectorate Secretions: No attempts Breath Support for Phonation: Adequate (at baseline) Intelligibility: Intelligible Respirations During Trial: 16 SpO2 During Trial: 96 %   Tracheostomy Tube  Additional Tracheostomy Tube Assessment Trach Collar Period:  (waking hours) Secretion Description:  (not visualized, thick yellow per chart) Level of Secretion Expectoration: Tracheal    Vent Dependency  Vent Dependent: Yes FiO2 (%): 40 % Nocturnal Vent: Yes    Cuff Deflation Trial Tolerated Cuff Deflation: Yes Length of Time for Cuff Deflation Trial: 20 minutes Behavior: Alert   [name], Alice NearingBonnie [name] 08/22/2013, 11:34 AM

## 2013-08-22 NOTE — Progress Notes (Signed)
Trach settings are too high to discharge to SNF, and pt continues to utilize vent. CSW following case and will continue to work with pt's daughter to create discharge plan.   Maryclare LabradorJulie Laisha Rau, MSW, [facility] Clinical Social Worker [phone]

## 2013-08-22 NOTE — Procedures (Signed)
Successful RUE DL POWER PICC TIP SVC/RA NO COMP STABLE READY FOR USE  

## 2013-08-23 LAB — CBC
HEMATOCRIT: 35 % — AB (ref 36.0–46.0)
Hemoglobin: 10.8 g/dL — ABNORMAL LOW (ref 12.0–15.0)
MCH: 27.3 pg (ref 26.0–34.0)
MCHC: 30.9 g/dL (ref 30.0–36.0)
MCV: 88.4 fL (ref 78.0–100.0)
Platelets: 204 10*3/uL (ref 150–400)
RBC: 3.96 MIL/uL (ref 3.87–5.11)
RDW: 16 % — ABNORMAL HIGH (ref 11.5–15.5)
WBC: 22 10*3/uL — AB (ref 4.0–10.5)

## 2013-08-23 LAB — BASIC METABOLIC PANEL
BUN: 26 mg/dL — AB (ref 6–23)
CHLORIDE: 94 meq/L — AB (ref 96–112)
CO2: 39 mEq/L — ABNORMAL HIGH (ref 19–32)
Calcium: 9.3 mg/dL (ref 8.4–10.5)
Creatinine, Ser: 0.81 mg/dL (ref 0.50–1.10)
GFR, EST AFRICAN AMERICAN: 87 mL/min — AB (ref >90)
GFR, EST NON AFRICAN AMERICAN: 75 mL/min — AB (ref >90)
Glucose, Bld: 148 mg/dL — ABNORMAL HIGH (ref 70–99)
POTASSIUM: 4.4 meq/L (ref 3.7–5.3)
SODIUM: 142 meq/L (ref 137–147)

## 2013-08-23 LAB — GLUCOSE, CAPILLARY
GLUCOSE-CAPILLARY: 111 mg/dL — AB (ref 70–99)
GLUCOSE-CAPILLARY: 100 mg/dL — AB (ref 70–99)
GLUCOSE-CAPILLARY: 119 mg/dL — AB (ref 70–99)
Glucose-Capillary: 160 mg/dL — ABNORMAL HIGH (ref 70–99)
Glucose-Capillary: 162 mg/dL — ABNORMAL HIGH (ref 70–99)

## 2013-08-23 LAB — BODY FLUID CULTURE

## 2013-08-23 MED ORDER — VITAMIN C 500 MG PO TABS
500.0000 mg | ORAL_TABLET | Freq: Two times a day (BID) | ORAL | Status: DC
  Administered 2013-08-23 – 2013-08-27 (×8): 500 mg via ORAL
  Filled 2013-08-23 (×9): qty 1

## 2013-08-23 MED ORDER — SENNA 8.6 MG PO TABS
2.0000 | ORAL_TABLET | Freq: Every day | ORAL | Status: DC
  Administered 2013-08-24 – 2013-08-27 (×4): 17.2 mg via ORAL
  Filled 2013-08-23 (×4): qty 2

## 2013-08-23 MED ORDER — ACETAMINOPHEN 325 MG PO TABS
650.0000 mg | ORAL_TABLET | Freq: Four times a day (QID) | ORAL | Status: DC | PRN
  Administered 2013-08-23 – 2013-08-25 (×4): 650 mg via ORAL
  Filled 2013-08-23 (×4): qty 2

## 2013-08-23 MED ORDER — SIMETHICONE 80 MG PO CHEW
80.0000 mg | CHEWABLE_TABLET | Freq: Three times a day (TID) | ORAL | Status: DC
  Administered 2013-08-23 – 2013-08-27 (×10): 80 mg via ORAL
  Filled 2013-08-23 (×15): qty 1

## 2013-08-23 MED ORDER — FAMOTIDINE 20 MG PO TABS
20.0000 mg | ORAL_TABLET | Freq: Every day | ORAL | Status: DC
  Filled 2013-08-23: qty 1

## 2013-08-23 MED ORDER — LORAZEPAM 0.5 MG PO TABS
0.5000 mg | ORAL_TABLET | Freq: Two times a day (BID) | ORAL | Status: DC | PRN
  Administered 2013-08-26 – 2013-08-27 (×2): 0.5 mg via ORAL
  Filled 2013-08-23 (×2): qty 1

## 2013-08-23 MED ORDER — POTASSIUM CHLORIDE CRYS ER 20 MEQ PO TBCR: 40.0000 meq | EXTENDED_RELEASE_TABLET | Freq: Two times a day (BID) | ORAL | Status: DC

## 2013-08-23 MED ORDER — SIMVASTATIN 10 MG PO TABS
10.0000 mg | ORAL_TABLET | Freq: Every day | ORAL | Status: DC
  Administered 2013-08-23 – 2013-08-26 (×4): 10 mg via ORAL
  Filled 2013-08-23 (×5): qty 1

## 2013-08-23 MED ORDER — FUROSEMIDE 40 MG PO TABS
40.0000 mg | ORAL_TABLET | Freq: Every day | ORAL | Status: DC
  Administered 2013-08-24 – 2013-08-27 (×4): 40 mg via ORAL
  Filled 2013-08-23 (×4): qty 1

## 2013-08-23 MED ORDER — VITAMIN C 500 MG PO TABS: 500.0000 mg | ORAL_TABLET | Freq: Two times a day (BID) | ORAL | Status: DC

## 2013-08-23 NOTE — Progress Notes (Addendum)
Pt. Temp 101.4 F, paged MD for PRN tylenol. Will continue to monitor  Rechecked pt. Temp 101.2 F. Applied iced packs and cool washcloth. Will continue to monitor.  Rechecked Temp 99.3 F. Will continue to monitor

## 2013-08-23 NOTE — Progress Notes (Signed)
Speech Language Pathology Treatment: Dysphagia;Passy Muir Speaking valve  Patient Details Name: [name] MRN: [id number] DOB: 08/02/1950 Today's Date: 08/23/2013 Time: [id number] SLP Time Calculation (min): 25 min  Assessment / Plan / Recommendation Clinical Impression  Pt now has dentures in place, tolerated mechanical soft POs well. She is known to have piecemeal transit of solid boluses with some premature spillage of solids to pharynx. Recommend slightly softer solids for this reason. SLP guided pt in learning regarding cuff and awareness of cuff inflation and deflation. Demonstrated deflation prior to PMSV placement. Pt needed max verbal cues to recall precaution correctly. Will need f/u if pt is to d/c with cuffed trach. She still requires pm vent so that may be a possibility. Will continue to follow 1x a week for education as needed. Pt may now have intermittent PMSV supervision.    HPI HPI: 63 yo morbidly obese [facility] pt with known hx of COPD , Chronic Resp, Failure , OSA/OHS Trach dependent brought to ER on 2/15 with increased dyspnea, hypoxia, fever and altered mental status found to be profoundly hypercarbic. [name] was changed, pt put on vent. Suspected mucus plug with continued RLL collapse. Pt known to this service, has had FEES demosntrating mild oral and oropharyngeal dysphagia; mehcanical soft diet and thin liqudis with small sips, straws restricted recommended. Has tolerated this die tover the last several visit and borther reprots she does well at home. She does not know where her PMSV is, but she wears it during all waking hours.     Pertinent Vitals NA  SLP Plan  Continue with current plan of care    Recommendations Diet recommendations: Dysphagia 3 (mechanical soft);Thin liquid Liquids provided via: Cup Medication Administration: Whole meds with puree Supervision: Patient able to self feed;Full supervision/cueing for compensatory strategies Compensations: Slow  rate;Small sips/bites Postural Changes and/or Swallow Maneuvers: Seated upright 90 degrees      Patient may use Passy-Muir Speech Valve: During all waking hours (remove during sleep);During PO intake/meals;Caregiver trained to provide supervision PMSV Supervision: Intermittent MD: Please consider changing trach tube to : Cuffless (when able to go off vent)       Plan: Continue with current plan of care    GO    [facility] CenterBonnie Ryin Ambrosius, MA CCC-SLP [phone]  [name], [name] 08/23/2013, 9:15 AM

## 2013-08-23 NOTE — Progress Notes (Signed)
Pt is refusing CPT at this time. Pt vitals are stable. Pt suctioned pink tinged thick secretions. RT will attempt later. RT will continue to monitor.

## 2013-08-23 NOTE — Progress Notes (Signed)
[facility] TEAM 1 - Stepdown/ICU TEAM Progress Note  [name] TDV:[id number] DOB: Dec 22, 1950 DOA: 08/18/2013 PCP: Ezequiel Kayser, MD  Admit HPI / Brief Narrative: 63 yo morbidly obese F SNF pt with known hx of COPD, Chronic Resp Failure, OSA/OHS, and Trach dependency who was brought to ER on 2/15 with increased dyspnea, hypoxia, fever and altered mental status.  She was found to be profoundly hypercarbic with PCO2~104, and had to be placed on a vent . PCCM admitted her.     SIGNIFICANT EVENTS / STUDIES:  2/15 trach replaced  2/17 Tx to SDU 2/19 PICC placed in IR   HPI/Subjective: Appears comfortable.  Able to cary on conversation w/o distress w/ PMV.  C/o feeling bad "all over."  Denies cp or specific focal complaints.   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 Care as per Pulmonary - on TC today and tolerating well      COPD w/ exacerbation  No wheezing on exam today  Acute encephalopathy in setting of severe hypercarbia  Appears to be greatly improved - follow  OHS/OSA  Per Pulmonary   DM CBG reasonably controlled - follow w/o change today   Bipolar Dz  Have resumed home meds   Chronic diastolic congestive heart failure  Cont to diurese as BP and renal fxn allow  HTN  Well controlled at present   Hypokalemia  Due to diuretic + poor intake - replace prn and follow - Mg ok at 1.8  Dysphagia  passed swallow eval 2/19 - advance diet today per SLP recs   Anemia Hgb stable - follow - no evidence of blood loss   Morbid Obesity - Body mass index is 55.54 kg/(m^2).  Code Status: FULL Family Communication: no family present at time of exam Disposition Plan: SDU  Consultants: PCCM  Antibiotics: 2/15 Aztreonam >>  2/15 Vanc >> 2/16  DVT prophylaxis: SQ  heparin   Objective: Blood pressure 123/51, pulse 108, temperature 98.6 F (37 C), temperature source Oral, resp. rate 31, height 5' (1.524 m), weight 129 kg (284 lb 6.3 oz), SpO2 97.00%.  Intake/Output Summary (Last 24 hours) at 08/23/13 1434 Last data filed at 08/23/13 1253  Gross per 24 hour  Intake   1300 ml  Output    600 ml  Net    700 ml   Exam: General: No acute respiratory distress at rest on TC w/ PMV in place  Lungs: poor air movement th/o all fields/distant breath sounds - no wheeze  Cardiovascular: Regular rate and rhythm without murmur gallop or rub - distant HS  Abdomen: Obese, nontender, nondistended, soft, bowel sounds positive, no rebound, no ascites, no appreciable mass Extremities: No significant cyanosis, or clubbing; 1+edema bilateral lower extremities  Data Reviewed: Basic Metabolic Panel:  Recent Labs Lab 08/18/13 1230 08/19/13 0307  08/20/13 0520 08/20/13 2138 08/21/13 0850 08/21/13 2113 08/22/13 0302 08/22/13 2140  NA 144 142  --  145  --   --   --  147 145  K 4.8 4.9  --  4.1  --   --   --  3.0* 3.1*  CL 94* 94*  --  92*  --   --   --  93* 98  CO2 39* 38*  --  43*  --   --   --  43* 38*  GLUCOSE 120* 153*  --  132*  --   --   --  106* 126*  BUN 12 18  --  28*  --   --   --  28* 26*  CREATININE 0.66 0.74  --  0.82  --   --   --  0.77 0.77  CALCIUM 9.6 9.3  --  9.6  --   --   --  9.6 7.8*  MG  --  2.0  < > 1.9 1.8 1.7 1.7 1.8  --   PHOS  --  4.3  < > 3.7 3.2 2.4 2.0* 2.7  --   < > = values in this interval not displayed.  Liver Function Tests:  Recent Labs Lab 08/18/13 1230  AST 26  ALT 9  ALKPHOS 65  BILITOT <0.2*  PROT 7.5  ALBUMIN 3.5   CBC:  Recent Labs Lab 08/18/13 1230 08/19/13 0307 08/20/13 0520 08/22/13 0302 08/23/13 1415  WBC 8.9 6.4 5.3 23.3* 22.0*  NEUTROABS 6.7  --   --   --   --   HGB 11.1* 10.6* 10.5* 11.9* 10.8*  HCT 35.8* 34.4* 33.5* 38.4 35.0*  MCV 89.3 88.7 87.2 87.7 88.4  PLT 185 240 245 266 204    Cardiac Enzymes:  Recent Labs Lab 08/18/13 1955  TROPONINI <0.30   BNP (last 3 results)  Recent Labs  07/03/13 1830 08/18/13 1230 08/19/13 0307  PROBNP 237.0* 932.0* 674.2*   CBG:  Recent Labs Lab 08/22/13 2109 08/22/13 2340 08/23/13 0350 08/23/13 0745 08/23/13 1255  GLUCAP 145* 187* 111* 100* 119*    Recent Results (from the past 240 hour(s))  CULTURE, BLOOD (ROUTINE X 2)     Status: None   Collection Time    08/18/13 12:30 PM      Result Value Ref Range Status   Specimen Description BLOOD RIGHT WRIST   Final   Special Requests BOTTLES DRAWN AEROBIC AND ANAEROBIC 2CC   Final   Culture  Setup Time     Final   Value: 08/18/2013 18:31     Performed at [facility]   Culture     Final   Value:        BLOOD CULTURE RECEIVED NO GROWTH TO DATE CULTURE WILL BE HELD FOR 5 DAYS BEFORE ISSUING A FINAL NEGATIVE REPORT     Performed at [facility]   Report Status PENDING   Incomplete  RESPIRATORY VIRUS PANEL     Status: None   Collection Time    08/18/13  5:29 PM      Result Value Ref Range Status   Source - RVPAN TRACHEAL ASPIRATE   Final   Respiratory Syncytial Virus A NOT DETECTED   Final   Respiratory Syncytial Virus B NOT DETECTED   Final   Influenza A NOT DETECTED   Final   Influenza B NOT DETECTED   Final   Parainfluenza 1 NOT DETECTED   Final   Parainfluenza 2 NOT DETECTED   Final   Parainfluenza 3 NOT DETECTED   Final   Metapneumovirus NOT DETECTED   Final   Rhinovirus NOT DETECTED   Final   Adenovirus NOT DETECTED   Final   Influenza A H1 NOT DETECTED   Final   Influenza A H3 NOT DETECTED   Final   Comment: (NOTE)           Normal Reference Range for each Analyte: NOT DETECTED     Testing  performed using the Luminex xTAG Respiratory Viral Panel test     kit.     This test was developed and its performance characteristics determined     by [facility]. It has not been cleared or approved by the [place]     Food and Drug  Administration. This test is used for clinical purposes.     It should not be regarded as investigational or for research. This     laboratory is certified under the [place] (CLIA) as qualified to perform high complexity     clinical laboratory testing.     Performed at [facility]     Status: None   Collection Time    08/18/13  5:30 PM      Result Value Ref Range Status   Specimen Description URINE, CATHETERIZED   Final   Special Requests NONE   Final   Culture  Setup Time     Final   Value: 08/19/2013 01:46     Performed at [facility]     Final   Value: NO GROWTH     Performed at [facility]   Culture     Final   Value: NO GROWTH     Performed at [facility]   Report Status 08/19/2013 FINAL   Final  CULTURE, RESPIRATORY (NON-EXPECTORATED)     Status: None   Collection Time    08/18/13  5:30 PM      Result Value Ref Range Status   Specimen Description TRACHEAL ASPIRATE   Final   Special Requests NONE   Final   Gram Stain     Final   Value: MODERATE WBC PRESENT, PREDOMINANTLY PMN     RARE SQUAMOUS EPITHELIAL CELLS PRESENT     NO ORGANISMS SEEN     Performed at [facility]   Culture     Final   Value: FEW STAPHYLOCOCCUS AUREUS     Note: RIFAMPIN AND GENTAMICIN SHOULD NOT BE USED AS SINGLE DRUGS FOR TREATMENT OF STAPH INFECTIONS.     RARE PSEUDOMONAS AERUGINOSA     Performed at [facility]   Report Status 08/22/2013 FINAL   Final   Organism ID, Bacteria STAPHYLOCOCCUS AUREUS   Final   Organism ID, Bacteria PSEUDOMONAS AERUGINOSA   Final  MRSA PCR SCREENING     Status: None   Collection Time    08/18/13  5:31 PM      Result Value Ref Range Status   MRSA by PCR NEGATIVE  NEGATIVE Final   Comment:            The GeneXpert MRSA Assay (FDA     approved for NASAL specimens     only), is one component of a     comprehensive MRSA colonization      surveillance program. It is not     intended to diagnose MRSA     infection nor to guide or     monitor treatment for     MRSA infections.  CULTURE, BLOOD (ROUTINE X 2)     Status: None   Collection Time    08/18/13  7:55 PM      Result Value Ref Range Status   Specimen Description BLOOD LEFT ARM   Final   Special Requests BOTTLES DRAWN AEROBIC ONLY 5CC   Final   Culture  Setup Time  Final   Value: 08/19/2013 01:43     Performed at [facility]   Culture     Final   Value:        BLOOD CULTURE RECEIVED NO GROWTH TO DATE CULTURE WILL BE HELD FOR 5 DAYS BEFORE ISSUING A FINAL NEGATIVE REPORT     Performed at [facility]   Report Status PENDING   Incomplete  BODY FLUID CULTURE     Status: None   Collection Time    08/20/13 12:20 PM      Result Value Ref Range Status   Specimen Description PLEURAL FLUID RIGHT   Final   Special Requests 10ML FLUID   Final   Gram Stain     Final   Value: MODERATE WBC PRESENT,BOTH PMN AND MONONUCLEAR     ABUNDANT GRAM NEGATIVE RODS     Gram Stain Report Called to,Read Back By and Verified With: Gram Stain Report Called to,Read Back By and Verified With:  PEPPA C AT 4497 ON 08/21/13 BY SMIAS     Performed at [facility]   Culture     Final   Value: ABUNDANT PSEUDOMONAS AERUGINOSA     MODERATE STAPHYLOCOCCUS AUREUS     Note: RIFAMPIN AND GENTAMICIN SHOULD NOT BE USED AS SINGLE DRUGS FOR TREATMENT OF STAPH INFECTIONS.     Performed at [facility]   Report Status 08/23/2013 FINAL   Final   Organism ID, Bacteria PSEUDOMONAS AERUGINOSA   Final   Organism ID, Bacteria STAPHYLOCOCCUS AUREUS   Final     Studies:  Recent x-ray studies have been reviewed in detail by the Attending Physician  Scheduled Meds:  Scheduled Meds: . antiseptic oral rinse  15 mL Mouth Rinse QID  . ARIPiprazole  10 mg Oral Daily  . budesonide (PULMICORT) nebulizer solution  0.25 mg Nebulization BID  . busPIRone  10 mg Oral BID  .  chlorhexidine  15 mL Mouth Rinse BID  . divalproex  250 mg Oral BID  . DULoxetine  60 mg Oral BID  . heparin  5,000 Units Subcutaneous 3 times per day  . imipenem-cilastatin  500 mg Intravenous Q6H  . ipratropium-albuterol  3 mL Nebulization Q6H  . pantoprazole  40 mg Oral Daily  . predniSONE  40 mg Oral Q breakfast    Time spent on care of this patient: 35 mins   Cherene Altes, MD [facility] For Consults/Admissions - Flow Manager - [phone] Office  [phone] Pager [phone]  On-Call/Text Page:      [email or web address]      password [facility]  08/23/2013, 2:34 PM   LOS: 5 days

## 2013-08-23 NOTE — Progress Notes (Signed)
[facility] before hospitalization. SNF checking in on pt, and CSW explained trach settings are too high to discharge to SNF, and pt continues to utilize vent. CSW following case and will continue to work with pt's daughter to create discharge plan. CSW will leave handoff for weekend CSW concerning pt's case.   Maryclare LabradorJulie Yatzari Jonsson, MSW, [facility]  Clinical Social Worker  [phone]

## 2013-08-23 NOTE — Progress Notes (Signed)
CSW had extended conversation with pt's brother [name], who explains he works from home as a Civil engineer, contractingVA claims agent and has extensive experience in healthcare. [name] was in the [employer] for 20 years and states he worked in healthcare for 20 years and has an Set designerMBA. [name] explained he and pt's daughter share HCPOA and [name] has been [name] pt. [name] states [name] and he is not sure they want to move her, but they have not been satisfied with the quality of trach care she has received. [name] spoke extensively with RT and CSW about weaning protocol and asked questions about timeframe for discharge. RT explained team has been trying to wean pt from the vent to 28% trach and that this takes time and pt will likely be here in the hospital for a few more days. [name] asked how often pt needs to be suctioned, and RT explained they check on this every 4 hours and suction as needed. [name] states [name] utilizes a contracting RT team that comes every other day or so, and he does not feel they are suctioning her enough. However, [name] is not certain he wants to move her because he does not know about the quantity/quality of care pt would receive at another trach SNF. CSW explained [name] bed offers contingent on her being at 28% trach setting, and CSW will have to re-submit clinicals to these facilities when she is ready for discharge to see if they still have a bed and feel they can handle her care needs. Brother understanding of all of this and states he will continue to have conversations with [name] about [name] and [name] as he has family member at this SNF. CSW continues to follow case and will create discharge plan with [name]/pt's daughter [name] when pt is ready for discharge.   Maryclare LabradorJulie Alexavier Tsutsui, MSW,  [facility]WA Clinical Social Worker [phone]

## 2013-08-23 NOTE — Progress Notes (Signed)
Patient ID: [name], female   DOB: 09/07/1950, 63 y.o.   MRN: [id number]          PROGRESS NOTE  DATE: 08/13/2013    [facility]  LEVEL OF CARE: SNF (31)  Acute Visit  CHIEF COMPLAINT:  Manage anxiety.    HISTORY OF PRESENT ILLNESS: I was requested by the staff to assess the patient regarding above problem(s):  ANXIETY: The anxiety is unstable. Patient is complaining of ongoing anxiety and not being able to breathe.  She is also having panic attacks.  She receives p.r.n. Ativan which is not controlling her symptoms.  No complications reported from the medications currently being used.    PAST MEDICAL HISTORY : Reviewed.  No changes.  CURRENT MEDICATIONS: [facility]  REVIEW OF SYSTEMS:  GENERAL: no change in appetite, no fatigue, no weight changes, no fever, chills or weakness RESPIRATORY: no cough, SOB, DOE,, wheezing, hemoptysis CARDIAC: no chest pain, edema or palpitations GI: no abdominal pain, diarrhea, constipation, heart burn, nausea or vomiting  PHYSICAL EXAMINATION  VS:  T 98.4      P 90     RR 18     BP 124/68    POX 97%       GENERAL: no acute distress, morbidly obese body habitus RESPIRATORY: breathing is even & unlabored, BS CTAB CARDIAC: RRR, no murmur,no extra heart sounds, no edema GI: abdomen soft, normal BS, no masses, no tenderness, no hepatomegaly, no splenomegaly PSYCHIATRIC: the patient is alert & oriented to person, affect & behavior appropriate  ASSESSMENT/PLAN:  Anxiety.  Unstable problem.  Start lorazepam 0.25 mg b.i.d. scheduled.    CPT CODE: [id number]     Angela CoxGayani Y Dasanayaka, MD [facility] [phone]

## 2013-08-23 NOTE — Progress Notes (Signed)
Pt. Stated feeling "bad" and c/o CP. Obtained EKG. BP 195/168. BS 166. Gave Pt. Pain meds. Paged MD.   Rechecked BP 121/70. O2 88%. Called RT to put Pt. On Vent.   Pt. O2 96%. Pt. Resting comfortably. Will continue to monitor

## 2013-08-23 NOTE — Progress Notes (Signed)
Pt is refusing CPT at this time. RT will attempt again later.  

## 2013-08-24 LAB — BASIC METABOLIC PANEL
BUN: 30 mg/dL — ABNORMAL HIGH (ref 6–23)
CALCIUM: 7.7 mg/dL — AB (ref 8.4–10.5)
CO2: 33 meq/L — AB (ref 19–32)
CREATININE: 1 mg/dL (ref 0.50–1.10)
Chloride: 100 mEq/L (ref 96–112)
GFR calc Af Amer: 67 mL/min — ABNORMAL LOW (ref >90)
GFR calc non Af Amer: 58 mL/min — ABNORMAL LOW (ref >90)
Glucose, Bld: 105 mg/dL — ABNORMAL HIGH (ref 70–99)
Potassium: 3.7 mEq/L (ref 3.7–5.3)
Sodium: 144 mEq/L (ref 137–147)

## 2013-08-24 LAB — CULTURE, BLOOD (ROUTINE X 2): Culture: NO GROWTH

## 2013-08-24 LAB — GLUCOSE, CAPILLARY: Glucose-Capillary: 90 mg/dL (ref 70–99)

## 2013-08-24 MED ORDER — BENZONATATE 100 MG PO CAPS
200.0000 mg | ORAL_CAPSULE | Freq: Three times a day (TID) | ORAL | Status: DC | PRN
  Administered 2013-08-26: 200 mg via ORAL
  Filled 2013-08-24: qty 2

## 2013-08-24 MED ORDER — PREDNISONE 20 MG PO TABS
20.0000 mg | ORAL_TABLET | Freq: Every day | ORAL | Status: DC
  Administered 2013-08-25 – 2013-08-26 (×2): 20 mg via ORAL
  Filled 2013-08-24 (×3): qty 1

## 2013-08-24 MED ORDER — FAMOTIDINE 40 MG/5ML PO SUSR
20.0000 mg | Freq: Every day | ORAL | Status: DC
  Administered 2013-08-24 – 2013-08-27 (×4): 20 mg via ORAL
  Filled 2013-08-24 (×4): qty 2.5

## 2013-08-24 MED ORDER — ONDANSETRON HCL 4 MG/2ML IJ SOLN
4.0000 mg | Freq: Three times a day (TID) | INTRAMUSCULAR | Status: DC | PRN
  Administered 2013-08-24: 4 mg via INTRAVENOUS
  Filled 2013-08-24: qty 2

## 2013-08-24 MED ORDER — GUAIFENESIN 100 MG/5ML PO SYRP
200.0000 mg | ORAL_SOLUTION | ORAL | Status: DC | PRN
  Administered 2013-08-24: 200 mg via ORAL
  Filled 2013-08-24 (×2): qty 10

## 2013-08-24 NOTE — Plan of Care (Signed)
Problem: ICU Phase Progression Outcomes Goal: O2 sats trending toward baseline Outcome: Progressing On trach collar

## 2013-08-24 NOTE — Progress Notes (Signed)
[place] TEAM 1 - Stepdown/ICU TEAM Progress Note  SHAMEL GALYEAN NTZ:[id number] DOB: 01-24-1951 DOA: 08/18/2013 PCP: Ezequiel Kayser, MD  Admit HPI / Brief Narrative: 63 yo morbidly obese F SNF pt with known hx of COPD, Chronic Resp Failure, OSA/OHS, and Trach dependency who was brought to ER on 2/15 with increased dyspnea, hypoxia, fever and altered mental status.  She was found to be profoundly hypercarbic with PCO2~104, and had to be placed on a vent . PCCM admitted her.     SIGNIFICANT EVENTS / STUDIES:  2/15 trach replaced  2/17 Tx to SDU 2/19 PICC placed in IR   HPI/Subjective: Able to cary on conversation w/o distress w/ PMV.  C/o intermittent cough.  No other complaints at this time.  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 Care as per Pulmonary - remains stable on trach collar     COPD w/ exacerbation  No wheezing on exam today - acute exacerbation resolved   Acute encephalopathy in setting of severe hypercarbia  Resolved   OHS/OSA  Per Pulmonary   DM CBG reasonably controlled    Bipolar Dz  Have resumed home meds - well compensated   Chronic diastolic congestive heart failure  Cont to diurese as BP and renal fxn allow  HTN  Well controlled at present   Hypokalemia  Due to diuretic + poor intake - replace prn and follow - Mg ok at 1.8  Dysphagia  passed swallow eval 2/19 - tolerating advanced diet   Anemia Hgb stable - follow - no evidence of blood loss   Morbid Obesity - Body mass index is 55.63 kg/(m^2).  Code Status: FULL Family Communication: no family present at time of exam Disposition Plan: should be ready to return to a SNF by Monday - keep in SDU for trach care  Consultants: PCCM  Antibiotics: 2/15 Aztreonam >>  2/17 2/15 Vanc 2/19 Primaxin >>  DVT prophylaxis: SQ heparin   Objective: Blood pressure 128/70, pulse 87, temperature 98.1 F (36.7 C), temperature source Axillary, resp. rate 18, height 5' (1.524 m), weight 129.2 kg (284 lb 13.4 oz), SpO2 96.00%.  Intake/Output Summary (Last 24 hours) at 08/24/13 1234 Last data filed at 08/24/13 1157  Gross per 24 hour  Intake   1110 ml  Output    850 ml  Net    260 ml   Exam: General: No acute respiratory distress on TC   Lungs: poor air movement th/o all fields/distant breath sounds - no wheeze  Cardiovascular: Regular rate and rhythm without murmur gallop or rub - distant HS  Abdomen: Obese, nontender, nondistended, soft, bowel sounds positive, no rebound, no ascites, no appreciable mass Extremities: No significant cyanosis, or clubbing; trace edema bilateral lower extremities  Data Reviewed: Basic Metabolic Panel:  Recent Labs Lab 08/20/13 0520 08/20/13 2138 08/21/13 0850 08/21/13 2113 08/22/13 0302 08/22/13 2140 08/23/13 1415 08/24/13 0508  NA 145  --   --   --  147 145 142 144  K 4.1  --   --   --  3.0* 3.1* 4.4 3.7  CL 92*  --   --   --  93* 98 94* 100  CO2 43*  --   --   --  43* 38* 39* 33*  GLUCOSE  132*  --   --   --  106* 126* 148* 105*  BUN 28*  --   --   --  28* 26* 26* 30*  CREATININE 0.82  --   --   --  0.77 0.77 0.81 1.00  CALCIUM 9.6  --   --   --  9.6 7.8* 9.3 7.7*  MG 1.9 1.8 1.7 1.7 1.8  --   --   --   PHOS 3.7 3.2 2.4 2.0* 2.7  --   --   --     Liver Function Tests:  Recent Labs Lab 08/18/13 1230  AST 26  ALT 9  ALKPHOS 65  BILITOT <0.2*  PROT 7.5  ALBUMIN 3.5   CBC:  Recent Labs Lab 08/18/13 1230 08/19/13 0307 08/20/13 0520 08/22/13 0302 08/23/13 1415  WBC 8.9 6.4 5.3 23.3* 22.0*  NEUTROABS 6.7  --   --   --   --   HGB 11.1* 10.6* 10.5* 11.9* 10.8*  HCT 35.8* 34.4* 33.5* 38.4 35.0*  MCV 89.3 88.7 87.2 87.7 88.4  PLT 185 240 245 266 204   Cardiac Enzymes:  Recent Labs Lab  08/18/13 1955  TROPONINI <0.30   BNP (last 3 results)  Recent Labs  07/03/13 1830 08/18/13 1230 08/19/13 0307  PROBNP 237.0* 932.0* 674.2*   CBG:  Recent Labs Lab 08/23/13 0745 08/23/13 1255 08/23/13 1619 08/23/13 2137 08/24/13 0817  GLUCAP 100* 119* 160* 162* 90    Recent Results (from the past 240 hour(s))  CULTURE, BLOOD (ROUTINE X 2)     Status: None   Collection Time    08/18/13 12:30 PM      Result Value Ref Range Status   Specimen Description BLOOD RIGHT WRIST   Final   Special Requests BOTTLES DRAWN AEROBIC AND ANAEROBIC 2CC   Final   Culture  Setup Time     Final   Value: 08/18/2013 18:31     Performed at [facility]   Culture     Final   Value:        BLOOD CULTURE RECEIVED NO GROWTH TO DATE CULTURE WILL BE HELD FOR 5 DAYS BEFORE ISSUING A FINAL NEGATIVE REPORT     Performed at [facility]   Report Status PENDING   Incomplete  RESPIRATORY VIRUS PANEL     Status: None   Collection Time    08/18/13  5:29 PM      Result Value Ref Range Status   Source - RVPAN TRACHEAL ASPIRATE   Final   Respiratory Syncytial Virus A NOT DETECTED   Final   Respiratory Syncytial Virus B NOT DETECTED   Final   Influenza A NOT DETECTED   Final   Influenza B NOT DETECTED   Final   Parainfluenza 1 NOT DETECTED   Final   Parainfluenza 2 NOT DETECTED   Final   Parainfluenza 3 NOT DETECTED   Final   Metapneumovirus NOT DETECTED   Final   Rhinovirus NOT DETECTED   Final   Adenovirus NOT DETECTED   Final   Influenza A H1 NOT DETECTED   Final   Influenza A H3 NOT DETECTED   Final   Comment: (NOTE)           Normal Reference Range for each Analyte: NOT DETECTED     Testing performed using the Luminex xTAG Respiratory Viral Panel test     kit.     This test was developed and its performance characteristics  determined     by [facility]. It has not been cleared or approved by the [place]     Food and Drug Administration. This test is used for clinical  purposes.     It should not be regarded as investigational or for research. This     laboratory is certified under the [place] (CLIA) as qualified to perform high complexity     clinical laboratory testing.     Performed at [place]     Status: None   Collection Time    08/18/13  5:30 PM      Result Value Ref Range Status   Specimen Description URINE, CATHETERIZED   Final   Special Requests NONE   Final   Culture  Setup Time     Final   Value: 08/19/2013 01:46     Performed at [facility]     Final   Value: NO GROWTH     Performed at [facility]   Culture     Final   Value: NO GROWTH     Performed at [facility]   Report Status 08/19/2013 FINAL   Final  CULTURE, RESPIRATORY (NON-EXPECTORATED)     Status: None   Collection Time    08/18/13  5:30 PM      Result Value Ref Range Status   Specimen Description TRACHEAL ASPIRATE   Final   Special Requests NONE   Final   Gram Stain     Final   Value: MODERATE WBC PRESENT, PREDOMINANTLY PMN     RARE SQUAMOUS EPITHELIAL CELLS PRESENT     NO ORGANISMS SEEN     Performed at [facility]   Culture     Final   Value: FEW STAPHYLOCOCCUS AUREUS     Note: RIFAMPIN AND GENTAMICIN SHOULD NOT BE USED AS SINGLE DRUGS FOR TREATMENT OF STAPH INFECTIONS.     RARE PSEUDOMONAS AERUGINOSA     Performed at [facility]   Report Status 08/22/2013 FINAL   Final   Organism ID, Bacteria STAPHYLOCOCCUS AUREUS   Final   Organism ID, Bacteria PSEUDOMONAS AERUGINOSA   Final  MRSA PCR SCREENING     Status: None   Collection Time    08/18/13  5:31 PM      Result Value Ref Range Status   MRSA by PCR NEGATIVE  NEGATIVE Final   Comment:            The GeneXpert MRSA Assay (FDA     approved for NASAL specimens     only), is one component of a     comprehensive MRSA colonization     surveillance program. It is not     intended  to diagnose MRSA     infection nor to guide or     monitor treatment for     MRSA infections.  CULTURE, BLOOD (ROUTINE X 2)     Status: None   Collection Time    08/18/13  7:55 PM      Result Value Ref Range Status   Specimen Description BLOOD LEFT ARM   Final   Special Requests BOTTLES DRAWN AEROBIC ONLY 5CC   Final   Culture  Setup Time     Final   Value: 08/19/2013 01:43     Performed at [place]     Final  Value:        BLOOD CULTURE RECEIVED NO GROWTH TO DATE CULTURE WILL BE HELD FOR 5 DAYS BEFORE ISSUING A FINAL NEGATIVE REPORT     Performed at [facility]   Report Status PENDING   Incomplete  BODY FLUID CULTURE     Status: None   Collection Time    08/20/13 12:20 PM      Result Value Ref Range Status   Specimen Description PLEURAL FLUID RIGHT   Final   Special Requests 10ML FLUID   Final   Gram Stain     Final   Value: MODERATE WBC PRESENT,BOTH PMN AND MONONUCLEAR     ABUNDANT GRAM NEGATIVE RODS     Gram Stain Report Called to,Read Back By and Verified With: Gram Stain Report Called to,Read Back By and Verified With:  PEPPA C AT 0092 ON 08/21/13 BY SMIAS     Performed at [facility]   Culture     Final   Value: ABUNDANT PSEUDOMONAS AERUGINOSA     MODERATE STAPHYLOCOCCUS AUREUS     Note: RIFAMPIN AND GENTAMICIN SHOULD NOT BE USED AS SINGLE DRUGS FOR TREATMENT OF STAPH INFECTIONS.     Performed at [facility]   Report Status 08/23/2013 FINAL   Final   Organism ID, Bacteria PSEUDOMONAS AERUGINOSA   Final   Organism ID, Bacteria STAPHYLOCOCCUS AUREUS   Final     Studies:  Recent x-ray studies have been reviewed in detail by the Attending Physician  Scheduled Meds:  Scheduled Meds: . antiseptic oral rinse  15 mL Mouth Rinse QID  . ARIPiprazole  10 mg Oral Daily  . budesonide (PULMICORT) nebulizer solution  0.25 mg Nebulization BID  . busPIRone  10 mg Oral BID  . chlorhexidine  15 mL Mouth Rinse BID  . divalproex  250  mg Oral BID  . DULoxetine  60 mg Oral BID  . famotidine  20 mg Oral Daily  . furosemide  40 mg Oral Daily  . heparin  5,000 Units Subcutaneous 3 times per day  . imipenem-cilastatin  500 mg Intravenous Q6H  . ipratropium-albuterol  3 mL Nebulization Q6H  . pantoprazole  40 mg Oral Daily  . predniSONE  40 mg Oral Q breakfast  . senna  2 tablet Oral Daily  . simethicone  80 mg Oral 3 times per day  . simvastatin  10 mg Oral QHS  . vitamin C  500 mg Oral BID    Time spent on care of this patient: 35 mins   Cherene Altes, MD [facility] For Consults/Admissions - Flow Manager - [phone] Office  [phone] Pager [phone]  On-Call/Text Page:      [email or web address]      password [facility]  08/24/2013, 12:34 PM   LOS: 6 days

## 2013-08-25 LAB — BASIC METABOLIC PANEL
BUN: 36 mg/dL — ABNORMAL HIGH (ref 6–23)
CHLORIDE: 94 meq/L — AB (ref 96–112)
CO2: 38 mEq/L — ABNORMAL HIGH (ref 19–32)
Calcium: 9.4 mg/dL (ref 8.4–10.5)
Creatinine, Ser: 0.94 mg/dL (ref 0.50–1.10)
GFR calc Af Amer: 73 mL/min — ABNORMAL LOW (ref >90)
GFR calc non Af Amer: 63 mL/min — ABNORMAL LOW (ref >90)
GLUCOSE: 133 mg/dL — AB (ref 70–99)
POTASSIUM: 5.4 meq/L — AB (ref 3.7–5.3)
Sodium: 142 mEq/L (ref 137–147)

## 2013-08-25 LAB — CULTURE, BLOOD (ROUTINE X 2): Culture: NO GROWTH

## 2013-08-25 NOTE — Progress Notes (Signed)
[place] TEAM 1 - Stepdown/ICU TEAM Progress Note  [name] VWA:[id number] DOB: 1950/07/24 DOA: 08/18/2013 PCP: Karlene Einstein, MD  Admit HPI / Brief Narrative: 63 yo morbidly obese F SNF pt with known hx of COPD, Chronic Resp Failure, OSA/OHS, and Trach dependency who was brought to ER on 2/15 with increased dyspnea, hypoxia, fever and altered mental status.  She was found to be profoundly hypercarbic with PCO2~104, and had to be placed on a vent . PCCM admitted her.     SIGNIFICANT EVENTS / STUDIES:  2/15 trach replaced  2/17 Tx to SDU 2/19 PICC placed in IR   HPI/Subjective: Resting comfortably.  No new complaints.  Has been going on vent each night.    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 Care as per Pulmonary - remains stable on trach collar - f/u CXR in AM      COPD w/ exacerbation  No wheezing on exam today - acute exacerbation resolved   Acute encephalopathy in setting of severe hypercarbia  Resolved - mental status at baseline   OHS/OSA  Per Pulmonary   DM CBG reasonably controlled - no changes in tx today   Bipolar Dz  Have resumed home meds - well compensated   Chronic diastolic congestive heart failure  Cont to diurese as BP and renal fxn allow - net negative ~4L - add fluid restriction   HTN  Well controlled at present   Hypokalemia  Due to diuretic + poor intake - replace prn and follow - Mg ok at 1.8  Dysphagia  passed swallow eval 2/19 - tolerating advanced diet   Anemia Hgb stable - follow - no evidence of blood loss   Morbid Obesity - Body mass index is 54.85 kg/(m^2).  Code Status: FULL Family Communication: no family present at time of exam Disposition Plan: should be ready to return to a SNF by Monday - keep in SDU for trach  care  Consultants: PCCM  Antibiotics: 2/15 Aztreonam >> 2/17 2/15 Vanc 2/19 Primaxin >>  DVT prophylaxis: SQ heparin   Objective: Blood pressure 131/67, pulse 92, temperature 98.3 F (36.8 C), temperature source Oral, resp. rate 15, height 5' (1.524 m), weight 127.4 kg (280 lb 13.9 oz), SpO2 98.00%.  Intake/Output Summary (Last 24 hours) at 08/25/13 1134 Last data filed at 08/25/13 0800  Gross per 24 hour  Intake    940 ml  Output    600 ml  Net    340 ml   Exam: General: No acute respiratory distress on TC  Lungs: poor air movement th/o all fields/distant breath sounds - no wheeze  Cardiovascular: Regular rate and rhythm without murmur gallop or rub - distant HS  Abdomen: Obese, nontender, nondistended, soft, bowel sounds positive, no rebound, no ascites, no appreciable mass Extremities: No significant cyanosis, or clubbing; trace edema bilateral lower extremities persists  Data Reviewed: Basic Metabolic Panel:  Recent Labs Lab 08/20/13 0520 08/20/13 2138 08/21/13 0850 08/21/13 2113 08/22/13 0302 08/22/13 2140 08/23/13 1415 08/24/13 0508  NA 145  --   --   --  147 145 142 144  K 4.1  --   --   --  3.0* 3.1* 4.4 3.7  CL 92*  --   --   --  93* 98 94* 100  CO2 43*  --   --   --  43* 38* 39* 33*  GLUCOSE 132*  --   --   --  106* 126* 148* 105*  BUN 28*  --   --   --  28* 26* 26* 30*  CREATININE 0.82  --   --   --  0.77 0.77 0.81 1.00  CALCIUM 9.6  --   --   --  9.6 7.8* 9.3 7.7*  MG 1.9 1.8 1.7 1.7 1.8  --   --   --   PHOS 3.7 3.2 2.4 2.0* 2.7  --   --   --     Liver Function Tests:  Recent Labs Lab 08/18/13 1230  AST 26  ALT 9  ALKPHOS 65  BILITOT <0.2*  PROT 7.5  ALBUMIN 3.5   CBC:  Recent Labs Lab 08/18/13 1230 08/19/13 0307 08/20/13 0520 08/22/13 0302 08/23/13 1415  WBC 8.9 6.4 5.3 23.3* 22.0*  NEUTROABS 6.7  --   --   --   --   HGB 11.1* 10.6* 10.5* 11.9* 10.8*  HCT 35.8* 34.4* 33.5* 38.4 35.0*  MCV 89.3 88.7 87.2 87.7 88.4  PLT 185  240 245 266 204   Cardiac Enzymes:  Recent Labs Lab 08/18/13 1955  TROPONINI <0.30   BNP (last 3 results)  Recent Labs  07/03/13 1830 08/18/13 1230 08/19/13 0307  PROBNP 237.0* 932.0* 674.2*   CBG:  Recent Labs Lab 08/23/13 0745 08/23/13 1255 08/23/13 1619 08/23/13 2137 08/24/13 0817  GLUCAP 100* 119* 160* 162* 90    Recent Results (from the past 240 hour(s))  CULTURE, BLOOD (ROUTINE X 2)     Status: None   Collection Time    08/18/13 12:30 PM      Result Value Ref Range Status   Specimen Description BLOOD RIGHT WRIST   Final   Special Requests BOTTLES DRAWN AEROBIC AND ANAEROBIC 2CC   Final   Culture  Setup Time     Final   Value: 08/18/2013 18:31     Performed at [facility]   Culture     Final   Value: NO GROWTH 5 DAYS     Performed at [facility]   Report Status 08/24/2013 FINAL   Final  RESPIRATORY VIRUS PANEL     Status: None   Collection Time    08/18/13  5:29 PM      Result Value Ref Range Status   Source - RVPAN TRACHEAL ASPIRATE   Final   Respiratory Syncytial Virus A NOT DETECTED   Final   Respiratory Syncytial Virus B NOT DETECTED   Final   Influenza A NOT DETECTED   Final   Influenza B NOT DETECTED   Final   Parainfluenza 1 NOT DETECTED   Final   Parainfluenza 2 NOT DETECTED   Final   Parainfluenza 3 NOT DETECTED   Final   Metapneumovirus NOT DETECTED   Final   Rhinovirus NOT DETECTED   Final   Adenovirus NOT DETECTED   Final   Influenza A H1 NOT DETECTED   Final   Influenza A H3 NOT DETECTED   Final   Comment: (NOTE)           Normal Reference Range for each Analyte: NOT DETECTED     Testing performed using the Luminex xTAG Respiratory Viral Panel test     kit.     This test was developed and its performance characteristics determined  by [facility]. It has not been cleared or approved by the [place]     Food and Drug Administration. This test is used for clinical purposes.     It should not be regarded as  investigational or for research. This     laboratory is certified under the [place] (CLIA) as qualified to perform high complexity     clinical laboratory testing.     Performed at [facility]     Status: None   Collection Time    08/18/13  5:30 PM      Result Value Ref Range Status   Specimen Description URINE, CATHETERIZED   Final   Special Requests NONE   Final   Culture  Setup Time     Final   Value: 08/19/2013 01:46     Performed at [facility]     Final   Value: NO GROWTH     Performed at [facility]   Culture     Final   Value: NO GROWTH     Performed at [facility]   Report Status 08/19/2013 FINAL   Final  CULTURE, RESPIRATORY (NON-EXPECTORATED)     Status: None   Collection Time    08/18/13  5:30 PM      Result Value Ref Range Status   Specimen Description TRACHEAL ASPIRATE   Final   Special Requests NONE   Final   Gram Stain     Final   Value: MODERATE WBC PRESENT, PREDOMINANTLY PMN     RARE SQUAMOUS EPITHELIAL CELLS PRESENT     NO ORGANISMS SEEN     Performed at [facility]   Culture     Final   Value: FEW STAPHYLOCOCCUS AUREUS     Note: RIFAMPIN AND GENTAMICIN SHOULD NOT BE USED AS SINGLE DRUGS FOR TREATMENT OF STAPH INFECTIONS.     RARE PSEUDOMONAS AERUGINOSA     Performed at [facility]   Report Status 08/22/2013 FINAL   Final   Organism ID, Bacteria STAPHYLOCOCCUS AUREUS   Final   Organism ID, Bacteria PSEUDOMONAS AERUGINOSA   Final  MRSA PCR SCREENING     Status: None   Collection Time    08/18/13  5:31 PM      Result Value Ref Range Status   MRSA by PCR NEGATIVE  NEGATIVE Final   Comment:            The GeneXpert MRSA Assay (FDA     approved for NASAL specimens     only), is one component of a     comprehensive MRSA colonization     surveillance program. It is not     intended to diagnose MRSA     infection nor to guide  or     monitor treatment for     MRSA infections.  CULTURE, BLOOD (ROUTINE X 2)     Status: None   Collection Time    08/18/13  7:55 PM      Result Value Ref Range Status   Specimen Description BLOOD LEFT ARM   Final   Special Requests BOTTLES DRAWN AEROBIC ONLY 5CC   Final   Culture  Setup Time     Final   Value: 08/19/2013 01:43     Performed at [facility]   Culture     Final   Value:  BLOOD CULTURE RECEIVED NO GROWTH TO DATE CULTURE WILL BE HELD FOR 5 DAYS BEFORE ISSUING A FINAL NEGATIVE REPORT     Performed at [facility]   Report Status PENDING   Incomplete  BODY FLUID CULTURE     Status: None   Collection Time    08/20/13 12:20 PM      Result Value Ref Range Status   Specimen Description PLEURAL FLUID RIGHT   Final   Special Requests 10ML FLUID   Final   Gram Stain     Final   Value: MODERATE WBC PRESENT,BOTH PMN AND MONONUCLEAR     ABUNDANT GRAM NEGATIVE RODS     Gram Stain Report Called to,Read Back By and Verified With: Gram Stain Report Called to,Read Back By and Verified With:  PEPPA C AT 3329 ON 08/21/13 BY SMIAS     Performed at [facility]   Culture     Final   Value: ABUNDANT PSEUDOMONAS AERUGINOSA     MODERATE STAPHYLOCOCCUS AUREUS     Note: RIFAMPIN AND GENTAMICIN SHOULD NOT BE USED AS SINGLE DRUGS FOR TREATMENT OF STAPH INFECTIONS.     Performed at [facility]   Report Status 08/23/2013 FINAL   Final   Organism ID, Bacteria PSEUDOMONAS AERUGINOSA   Final   Organism ID, Bacteria STAPHYLOCOCCUS AUREUS   Final     Studies:  Recent x-ray studies have been reviewed in detail by the Attending Physician  Scheduled Meds:  Scheduled Meds: . antiseptic oral rinse  15 mL Mouth Rinse QID  . ARIPiprazole  10 mg Oral Daily  . budesonide (PULMICORT) nebulizer solution  0.25 mg Nebulization BID  . busPIRone  10 mg Oral BID  . chlorhexidine  15 mL Mouth Rinse BID  . divalproex  250 mg Oral BID  . DULoxetine  60 mg Oral BID    . famotidine  20 mg Oral Daily  . furosemide  40 mg Oral Daily  . heparin  5,000 Units Subcutaneous 3 times per day  . imipenem-cilastatin  500 mg Intravenous Q6H  . ipratropium-albuterol  3 mL Nebulization Q6H  . pantoprazole  40 mg Oral Daily  . predniSONE  20 mg Oral Q breakfast  . senna  2 tablet Oral Daily  . simethicone  80 mg Oral 3 times per day  . simvastatin  10 mg Oral QHS  . vitamin C  500 mg Oral BID    Time spent on care of this patient: 25 mins   Cherene Altes, MD [facility] For Consults/Admissions - Flow Manager - [phone] Office  [phone] Pager [phone]  On-Call/Text Page:      [email or web address]      password [facility]  08/25/2013, 11:34 AM   LOS: 7 days

## 2013-08-26 LAB — CBC
HCT: 33.1 % — ABNORMAL LOW (ref 36.0–46.0)
HEMOGLOBIN: 9.7 g/dL — AB (ref 12.0–15.0)
MCH: 26.7 pg (ref 26.0–34.0)
MCHC: 29.3 g/dL — ABNORMAL LOW (ref 30.0–36.0)
MCV: 91.2 fL (ref 78.0–100.0)
Platelets: 229 10*3/uL (ref 150–400)
RBC: 3.63 MIL/uL — ABNORMAL LOW (ref 3.87–5.11)
RDW: 15.5 % (ref 11.5–15.5)
WBC: 9.4 10*3/uL (ref 4.0–10.5)

## 2013-08-26 LAB — BASIC METABOLIC PANEL
BUN: 33 mg/dL — ABNORMAL HIGH (ref 6–23)
CALCIUM: 9.5 mg/dL (ref 8.4–10.5)
CO2: 39 mEq/L — ABNORMAL HIGH (ref 19–32)
CREATININE: 0.99 mg/dL (ref 0.50–1.10)
Chloride: 97 mEq/L (ref 96–112)
GFR calc non Af Amer: 59 mL/min — ABNORMAL LOW (ref >90)
GFR, EST AFRICAN AMERICAN: 68 mL/min — AB (ref >90)
GLUCOSE: 124 mg/dL — AB (ref 70–99)
Potassium: 4.8 mEq/L (ref 3.7–5.3)
Sodium: 145 mEq/L (ref 137–147)

## 2013-08-26 MED ORDER — ISOSORBIDE MONONITRATE ER 30 MG PO TB24
30.0000 mg | ORAL_TABLET | Freq: Every day | ORAL | Status: DC
  Administered 2013-08-27: 30 mg via ORAL
  Filled 2013-08-26: qty 1

## 2013-08-26 MED ORDER — PREDNISONE 10 MG PO TABS
10.0000 mg | ORAL_TABLET | Freq: Every day | ORAL | Status: DC
  Administered 2013-08-27: 10 mg via ORAL
  Filled 2013-08-26 (×2): qty 1

## 2013-08-26 MED ORDER — AMLODIPINE BESYLATE 10 MG PO TABS
10.0000 mg | ORAL_TABLET | Freq: Every day | ORAL | Status: DC
  Administered 2013-08-27: 10 mg via ORAL
  Filled 2013-08-26: qty 1

## 2013-08-26 NOTE — Progress Notes (Addendum)
CSW consulted with MD, and CSW asked if disposition is for vent SNF or if MD believes pt will wean to trach. MD states pulmonary needs to weigh in on this. CSW continues to follow case and assist with placement once disposition is clear for either vent SNF or trach SNF. Pt must be at 28% at 5 L/min trach to qualify for trach SNF.  Addendum: CSW spoke with MD, who states medical team will continue to attempt to wean pt back to trach at 28% for the next few nights. CSW to hold off on vent referrals until it is clear what level of care facility pt will require.  Maryclare LabradorJulie Tammi Boulier, MSW, [facility] Eye CenterCSWA Clinical Social Worker [phone]

## 2013-08-26 NOTE — Progress Notes (Addendum)
NUTRITION FOLLOW UP  DOCUMENTATION CODES Per approved criteria  -Morbid Obesity   INTERVENTION:  No nutrition intervention warranted at this time   RD to continue to follow  NUTRITION DIAGNOSIS: Inadequate oral intake, resolved  New Goal: Pt to meet >/= 90% of their estimated nutrition needs, met  Monitor:  PO intake, respiratory status, weight, labs, I/O's  ASSESSMENT:  Patient is a morbidly obese [facility] pt with known hx of COPD , Chronic Resp, Failure , OSA/OHS Trach dependent brought to ER on 2/15 with increased dyspnea, hypoxia, fever and altered mental status found to be profoundly hypercarbic with PCO2~104, placed on vent.   Patient transferred to 2C-Stepdown from 5M-Medical ICU 2/17.  Patient on nocturnal ventilatory support -- trach MV: 6.4 L/min Temp (24hrs), Avg:98.6 F (37 C), Min:98.4 F (36.9 C), Max:98.9 F (37.2 C)   Vital HP formula discontinued 2/18.  S/p bedside swallow evaluation 2/19, treatment note 2/20.  PO intake 100% per flowsheet records.  Disposition: vent vs trach SNF placement.  Height: Ht Readings from Last 1 Encounters:  08/20/13 5' (1.524 m)    Weight: Wt Readings from Last 1 Encounters:  08/26/13 284 lb 9.8 oz (129.1 kg)    Estimated Nutritional Needs: Kcal: 1750 Protein: 90-100 gm grams Fluid: 1.7-1.8 L  Skin: Intact  Diet Order: Dysphagia 3, thin liquids   Intake/Output Summary (Last 24 hours) at 08/26/13 1132 Last data filed at 08/26/13 0726  Gross per 24 hour  Intake   1125 ml  Output   2225 ml  Net  -1100 ml    Labs:   Recent Labs Lab 08/21/13 0850 08/21/13 2113 08/22/13 0302  08/24/13 0508 08/25/13 1453 08/26/13 0415  NA  --   --  147  < > 144 142 145  K  --   --  3.0*  < > 3.7 5.4* 4.8  CL  --   --  93*  < > 100 94* 97  CO2  --   --  43*  < > 33* 38* 39*  BUN  --   --  28*  < > 30* 36* 33*  CREATININE  --   --  0.77  < > 1.00 0.94 0.99  CALCIUM  --   --  9.6  < > 7.7* 9.4 9.5  MG 1.7 1.7 1.8  --    --   --   --   PHOS 2.4 2.0* 2.7  --   --   --   --   GLUCOSE  --   --  106*  < > 105* 133* 124*  < > = values in this interval not displayed.  CBG (last 3)   Recent Labs  08/23/13 1619 08/23/13 2137 08/24/13 0817  GLUCAP 160* 162* 90    Scheduled Meds: . antiseptic oral rinse  15 mL Mouth Rinse QID  . ARIPiprazole  10 mg Oral Daily  . budesonide (PULMICORT) nebulizer solution  0.25 mg Nebulization BID  . busPIRone  10 mg Oral BID  . chlorhexidine  15 mL Mouth Rinse BID  . divalproex  250 mg Oral BID  . DULoxetine  60 mg Oral BID  . famotidine  20 mg Oral Daily  . furosemide  40 mg Oral Daily  . heparin  5,000 Units Subcutaneous 3 times per day  . imipenem-cilastatin  500 mg Intravenous Q6H  . ipratropium-albuterol  3 mL Nebulization Q6H  . pantoprazole  40 mg Oral Daily  . predniSONE  20 mg Oral  Q breakfast  . senna  2 tablet Oral Daily  . simethicone  80 mg Oral 3 times per day  . simvastatin  10 mg Oral QHS  . vitamin C  500 mg Oral BID    Continuous Infusions: . sodium chloride 1 mL (08/24/13 5694)    Past Medical History  Diagnosis Date  . COPD   . Hypertension   . Chronic respiratory failure     3 L oxygen  . GERD (gastroesophageal reflux disease)   . Diabetes mellitus   . Obesity, Class III, BMI 40-49.9 (morbid obesity)   . Obstructive sleep apnea on CPAP   . Peripheral vascular disease   . Bipolar disease, chronic   . Lymphedema   . COPD (chronic obstructive pulmonary disease)   . Cellulitis   . Coronary artery disease   . Asthma   . Myocardial infarction   . Vertigo     Past Surgical History  Procedure Laterality Date  . Abdominal hysterectomy    . Cholecystectomy    . Appendectomy    . Tubal ligation    . Tracheostomy      Arthur Holms, RD, LDN Pager #: [phone] After-Hours Pager #: [phone]

## 2013-08-26 NOTE — Progress Notes (Signed)
Patient refusing CPT at this time, will try again at a later time. RT will continue to monitor.

## 2013-08-26 NOTE — Progress Notes (Signed)
[facility] TEAM 1 - Stepdown/ICU TEAM Progress Note  [name] GQQ:[id number] DOB: December 11, 1950 DOA: 08/18/2013 PCP: Ezequiel Kayser, MD  Admit HPI / Brief Narrative: 63 yo morbidly obese [facility] pt with known hx of COPD, Chronic Resp Failure, OSA/OHS, and Trach dependency who was brought to ER on 2/15 with increased dyspnea, hypoxia, fever and altered mental status.  She was found to be profoundly hypercarbic with PCO2~104, and had to be placed on a vent . PCCM admitted her.     SIGNIFICANT EVENTS / STUDIES:  2/15 trach replaced  2/17 Tx to SDU 2/19 PICC placed in IR   HPI/Subjective: Resting comfortably.  No new complaints.  Has been going on vent each night, but prior to admission was able to make it th/o the night w/o vent suport.    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 remains stable on trach collar - will stop QHS PRVC vent support and observe tonight on TC O2 only - if does well, will be ready for d/c to SNF - if fails, will re-consult Pulm to address 2/24      COPD w/ exacerbation  No wheezing on exam today - acute exacerbation resolved   Acute encephalopathy in setting of severe hypercarbia  Resolved - mental status at baseline   OHS/OSA  Per Pulmonary   DM CBG reasonably controlled - no changes in tx today   Bipolar Dz  Have resumed home meds - well compensated   Chronic diastolic congestive heart failure  Cont to diurese as BP and renal fxn allow - net negative ~6L - fluid restriction   HTN  BP not well controlled today - adjust tx plan and follow   Hypokalemia  Due to diuretic + poor intake - replace prn and follow - Mg ok at 1.8  Dysphagia  passed swallow eval 2/19 - tolerating diet   Anemia Hgb stable - follow - no  evidence of blood loss   Morbid Obesity - Body mass index is 55.58 kg/(m^2).  Code Status: FULL Family Communication: no family present at time of exam Disposition Plan: should be ready to return to a SNF by Tuesday if tolerates overnight off vent support - keep in SDU for trach care  Consultants: PCCM  Antibiotics: 2/15 Aztreonam >> 2/17 2/15 Vanc 2/19 Primaxin >>  DVT prophylaxis: SQ heparin   Objective: Blood pressure 147/76, pulse 101, temperature 98.5 F (36.9 C), temperature source Oral, resp. rate 25, height 5' (1.524 m), weight 129.1 kg (284 lb 9.8 oz), SpO2 100.00%.  Intake/Output Summary (Last 24 hours) at 08/26/13 1259 Last data filed at 08/26/13 6712  Gross per 24 hour  Intake   1125 ml  Output   2225 ml  Net  -1100 ml   Exam: General: No acute respiratory distress on TC  Lungs: poor air movement th/o all fields/distant breath sounds - no wheeze  Cardiovascular: Regular rate and rhythm without murmur gallop or rub - distant HS  Abdomen: Obese, nontender, nondistended, soft, bowel sounds positive, no rebound, no ascites, no appreciable mass Extremities: No significant cyanosis, or clubbing; trace edema bilateral lower extremities persists  Data Reviewed: Basic Metabolic Panel:  Recent Labs Lab 08/20/13 0520 08/20/13 2138 08/21/13 0850 08/21/13 2113 08/22/13 0302 08/22/13 2140 08/23/13  1415 08/24/13 0508 08/25/13 1453 08/26/13 0415  NA 145  --   --   --  147 145 142 144 142 145  K 4.1  --   --   --  3.0* 3.1* 4.4 3.7 5.4* 4.8  CL 92*  --   --   --  93* 98 94* 100 94* 97  CO2 43*  --   --   --  43* 38* 39* 33* 38* 39*  GLUCOSE 132*  --   --   --  106* 126* 148* 105* 133* 124*  BUN 28*  --   --   --  28* 26* 26* 30* 36* 33*  CREATININE 0.82  --   --   --  0.77 0.77 0.81 1.00 0.94 0.99  CALCIUM 9.6  --   --   --  9.6 7.8* 9.3 7.7* 9.4 9.5  MG 1.9 1.8 1.7 1.7 1.8  --   --   --   --   --   PHOS 3.7 3.2 2.4 2.0* 2.7  --   --   --   --   --    Liver  Function Tests: No results found for this basename: AST, ALT, ALKPHOS, BILITOT, PROT, ALBUMIN,  in the last 168 hours  CBC:  Recent Labs Lab 08/20/13 0520 08/22/13 0302 08/23/13 1415 08/26/13 0415  WBC 5.3 23.3* 22.0* 9.4  HGB 10.5* 11.9* 10.8* 9.7*  HCT 33.5* 38.4 35.0* 33.1*  MCV 87.2 87.7 88.4 91.2  PLT 245 266 204 229   BNP (last 3 results)  Recent Labs  07/03/13 1830 08/18/13 1230 08/19/13 0307  PROBNP 237.0* 932.0* 674.2*   CBG:  Recent Labs Lab 08/23/13 0745 08/23/13 1255 08/23/13 1619 08/23/13 2137 08/24/13 0817  GLUCAP 100* 119* 160* 162* 90    Recent Results (from the past 240 hour(s))  CULTURE, BLOOD (ROUTINE X 2)     Status: None   Collection Time    08/18/13 12:30 PM      Result Value Ref Range Status   Specimen Description BLOOD RIGHT WRIST   Final   Special Requests BOTTLES DRAWN AEROBIC AND ANAEROBIC 2CC   Final   Culture  Setup Time     Final   Value: 08/18/2013 18:31     Performed at [facility]   Culture     Final   Value: NO GROWTH 5 DAYS     Performed at [facility]   Report Status 08/24/2013 FINAL   Final  RESPIRATORY VIRUS PANEL     Status: None   Collection Time    08/18/13  5:29 PM      Result Value Ref Range Status   Source - RVPAN TRACHEAL ASPIRATE   Final   Respiratory Syncytial Virus A NOT DETECTED   Final   Respiratory Syncytial Virus B NOT DETECTED   Final   Influenza A NOT DETECTED   Final   Influenza B NOT DETECTED   Final   Parainfluenza 1 NOT DETECTED   Final   Parainfluenza 2 NOT DETECTED   Final   Parainfluenza 3 NOT DETECTED   Final   Metapneumovirus NOT DETECTED   Final   Rhinovirus NOT DETECTED   Final   Adenovirus NOT DETECTED   Final   Influenza A H1 NOT DETECTED   Final   Influenza A H3 NOT DETECTED   Final   Comment: (NOTE)           Normal Reference Range  for each Analyte: NOT DETECTED     Testing performed using the Luminex xTAG Respiratory Viral Panel test     kit.     This test  was developed and its performance characteristics determined     by [facility]. It has not been cleared or approved by the [place]     Food and Drug Administration. This test is used for clinical purposes.     It should not be regarded as investigational or for research. This     laboratory is certified under the [employer] (CLIA) as qualified to perform high complexity     clinical laboratory testing.     Performed at [place]     Status: None   Collection Time    08/18/13  5:30 PM      Result Value Ref Range Status   Specimen Description URINE, CATHETERIZED   Final   Special Requests NONE   Final   Culture  Setup Time     Final   Value: 08/19/2013 01:46     Performed at [facility]     Final   Value: NO GROWTH     Performed at [facility]   Culture     Final   Value: NO GROWTH     Performed at [facility]   Report Status 08/19/2013 FINAL   Final  CULTURE, RESPIRATORY (NON-EXPECTORATED)     Status: None   Collection Time    08/18/13  5:30 PM      Result Value Ref Range Status   Specimen Description TRACHEAL ASPIRATE   Final   Special Requests NONE   Final   Gram Stain     Final   Value: MODERATE WBC PRESENT, PREDOMINANTLY PMN     RARE SQUAMOUS EPITHELIAL CELLS PRESENT     NO ORGANISMS SEEN     Performed at [facility]   Culture     Final   Value: FEW STAPHYLOCOCCUS AUREUS     Note: RIFAMPIN AND GENTAMICIN SHOULD NOT BE USED AS SINGLE DRUGS FOR TREATMENT OF STAPH INFECTIONS.     RARE PSEUDOMONAS AERUGINOSA     Performed at [facility]   Report Status 08/22/2013 FINAL   Final   Organism ID, Bacteria STAPHYLOCOCCUS AUREUS   Final   Organism ID, Bacteria PSEUDOMONAS AERUGINOSA   Final  MRSA PCR SCREENING     Status: None   Collection Time    08/18/13  5:31 PM      Result Value Ref Range Status   MRSA by PCR NEGATIVE  NEGATIVE Final    Comment:            The GeneXpert MRSA Assay (FDA     approved for NASAL specimens     only), is one component of a     comprehensive MRSA colonization     surveillance program. It is not     intended to diagnose MRSA     infection nor to guide or     monitor treatment for     MRSA infections.  CULTURE, BLOOD (ROUTINE X 2)     Status: None   Collection Time    08/18/13  7:55 PM      Result Value Ref Range Status   Specimen Description BLOOD LEFT ARM   Final   Special Requests BOTTLES DRAWN AEROBIC ONLY 5CC  Final   Culture  Setup Time     Final   Value: 08/19/2013 01:43     Performed at [facility]   Culture     Final   Value: NO GROWTH 5 DAYS     Performed at [facility]   Report Status 08/25/2013 FINAL   Final  BODY FLUID CULTURE     Status: None   Collection Time    08/20/13 12:20 PM      Result Value Ref Range Status   Specimen Description PLEURAL FLUID RIGHT   Final   Special Requests 10ML FLUID   Final   Gram Stain     Final   Value: MODERATE WBC PRESENT,BOTH PMN AND MONONUCLEAR     ABUNDANT GRAM NEGATIVE RODS     Gram Stain Report Called to,Read Back By and Verified With: Gram Stain Report Called to,Read Back By and Verified With:  PEPPA C AT 7366 ON 08/21/13 BY SMIAS     Performed at [facility]   Culture     Final   Value: ABUNDANT PSEUDOMONAS AERUGINOSA     MODERATE STAPHYLOCOCCUS AUREUS     Note: RIFAMPIN AND GENTAMICIN SHOULD NOT BE USED AS SINGLE DRUGS FOR TREATMENT OF STAPH INFECTIONS.     Performed at [facility]   Report Status 08/23/2013 FINAL   Final   Organism ID, Bacteria PSEUDOMONAS AERUGINOSA   Final   Organism ID, Bacteria STAPHYLOCOCCUS AUREUS   Final     Studies:  Recent x-ray studies have been reviewed in detail by the Attending Physician  Scheduled Meds:  Scheduled Meds: . antiseptic oral rinse  15 mL Mouth Rinse QID  . ARIPiprazole  10 mg Oral Daily  . budesonide (PULMICORT) nebulizer solution  0.25  mg Nebulization BID  . busPIRone  10 mg Oral BID  . chlorhexidine  15 mL Mouth Rinse BID  . divalproex  250 mg Oral BID  . DULoxetine  60 mg Oral BID  . famotidine  20 mg Oral Daily  . furosemide  40 mg Oral Daily  . heparin  5,000 Units Subcutaneous 3 times per day  . imipenem-cilastatin  500 mg Intravenous Q6H  . ipratropium-albuterol  3 mL Nebulization Q6H  . pantoprazole  40 mg Oral Daily  . predniSONE  20 mg Oral Q breakfast  . senna  2 tablet Oral Daily  . simethicone  80 mg Oral 3 times per day  . simvastatin  10 mg Oral QHS  . vitamin C  500 mg Oral BID    Time spent on care of this patient: 35 mins   Cherene Altes, MD [facility] For Consults/Admissions - Flow Manager - [phone] Office  [phone] Pager [phone]  On-Call/Text Page:      [email or web address]      password [facility]  08/26/2013, 12:59 PM   LOS: 8 days

## 2013-08-27 ENCOUNTER — Other Ambulatory Visit: Payer: Self-pay | Admitting: *Deleted

## 2013-08-27 DIAGNOSIS — J151 Pneumonia due to Pseudomonas: Principal | ICD-10-CM

## 2013-08-27 DIAGNOSIS — R7302 Impaired glucose tolerance (oral): Secondary | ICD-10-CM | POA: Diagnosis present

## 2013-08-27 MED ORDER — BENZONATATE 200 MG PO CAPS: 200.0000 mg | ORAL_CAPSULE | Freq: Three times a day (TID) | ORAL | Status: DC | PRN

## 2013-08-27 MED ORDER — ASCORBIC ACID 500 MG PO TABS: 500.0000 mg | ORAL_TABLET | Freq: Two times a day (BID) | ORAL | Status: DC

## 2013-08-27 MED ORDER — LORAZEPAM 0.5 MG PO TABS: ORAL_TABLET | ORAL | Status: DC

## 2013-08-27 MED ORDER — SODIUM CHLORIDE 0.9 % IV SOLN: 500.0000 mg | Freq: Four times a day (QID) | INTRAVENOUS | Status: DC

## 2013-08-27 NOTE — Progress Notes (Signed)
CSW called pt's brother Iantha FallenKenneth and explained MD is checking on pt now but plan is for discharge today and CSW was wondering which facility pt's family wants her to go to. Iantha FallenKenneth explained he would like a copy of the discharge summary and [facility] but that if their tr[facility] care does not meet the standards he has for pt, he would like to [name]. Iantha FallenKenneth states he is unsure if he will be able to come to the hospital to pick up a copy of the discharge summary, and CSW offered to fax it to him. CSW explained CSW will fax a copy of the discharge summary to his fax machine [phone] along with contact information for the [facility] so he can contact them if he is unhappy [facility]. [facility] called both [facility] and updated them on plan. Facilities state they are aware and that this sounds like a good plan. Iantha FallenKenneth thanked CSW for assistance and states he will follow up with facilities if he feels a transfer is appropriate.   Maryclare LabradorJulie Yaniah Thiemann, MSW, [facility] Clinical Social Worker [phone]

## 2013-08-27 NOTE — Progress Notes (Signed)
Per MD note, "should be ready to return to a SNF by Tuesday if tolerates overnight off vent support." CSW to check in with MD about disposition and potential for discharge.   Maryclare LabradorJulie Grete Bosko, MSW, [facility] Clinical Social Worker [phone]

## 2013-08-27 NOTE — Progress Notes (Addendum)
CSW compiled discharge packet and placed on pt's chart. CSW scheduled [facility] by completing online form and received verification that pick-up has been scheduled for 11:15am or next available. RN informed of discharge. CSW faxed discharge summary and AVS to pt's brother at his request, and invited him to call before [facility] and CSW will let him know if pt has left hospital.   Addendum: [facility] picked up pt. RN called report. CSW called pt's brother to inform him of transfer. CSW signing off.   Maryclare LabradorJulie Jacson Rapaport, MSW, [facility] Clinical Social Worker [phone]

## 2013-08-27 NOTE — Telephone Encounter (Signed)
[facility] 

## 2013-08-27 NOTE — Discharge Summary (Signed)
Physician Discharge Summary  [name] JJO:[id number] DOB: Mar 17, 1951 DOA: 08/18/2013  PCP: Ezequiel Kayser, MD  Admit date: 08/18/2013 Discharge date: 08/27/2013  Time spent: >45 minutes  Recommendations for Outpatient Follow-up:  1. D/c PICC and Primaxin after last dose tomorrow  Discharge Diagnoses:  Principal Problem:     Acute respiratory failure-Pneumonia due to Pseudomonas Active Problems:   Morbid obesity   BIPOLAR DISORDER UNSPECIFIED   OBSTRUCTIVE SLEEP APNEA   C O P D   Chronic respiratory failure   Acute diastolic CHF (congestive heart failure)   Acute exacerbation of chronic obstructive pulmonary disease (COPD)   Anxiety state, unspecified    Discharge Condition: stable  Diet recommendation: heart heathy, low sodium, carb modified- ADA diet with 1000 cc fluid restriction  Filed Weights   08/25/13 0500 08/26/13 0500 08/27/13 0327  Weight: 127.4 kg (280 lb 13.9 oz) 129.1 kg (284 lb 9.8 oz) 129.1 kg (284 lb 9.8 oz)    History of present illness:  63 yo morbidly obese F SN[facility] pt with known hx of COPD, Chronic Resp Failure, OSA/OHS, and Trach dependency who was brought to ER on 2/15 with increased dyspnea, hypoxia, fever and altered mental status. She was found to be profoundly hypercarbic with PCO2~104, and had to be placed on a vent and admitted to the ICU.  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  Hypokalemia  Due to diuretic + poor intake - replaced prn- Mg ok at 1.8   Dysphagia  passed swallow eval 2/19 - tolerating diet   Anemia  Hgb stable - follow - no evidence of blood loss   Morbid Obesity - Body mass index is 55.58 kg/(m^2).   Discharge Exam: Filed Vitals:   08/27/13 0800  BP: 175/95  Pulse: 90  Temp: 98.4  Resp: 27   General: No acute respiratory distress on TC  Lungs: poor air movement th/o all fields/distant breath sounds - no wheeze  Cardiovascular: Regular rate and rhythm without murmur gallop or rub - distant HS  Abdomen: Obese, nontender, nondistended, soft, bowel sounds positive, no rebound, no ascites, no appreciable mass  Extremities: No significant cyanosis, or clubbing; trace edema bilateral lower extremities persists   Discharge Instructions      Discharge Orders   Future Orders Complete By Expires   Diet - low sodium heart healthy  As directed    Comments:     Carb modified- ADA diet   Increase activity slowly  As directed        Medication List         albuterol (2.5 MG/3ML) 0.083% nebulizer solution  Commonly known as:  PROVENTIL  Take 3 mLs (2.5 mg total) by nebulization every 4 (four) hours as needed for wheezing.     amLODipine 10 MG tablet  Commonly known as:  NORVASC  Take 10 mg by mouth daily.     arformoterol 15 MCG/2ML Nebu  Commonly known as:  BROVANA  Take 2 mLs (15 mcg total) by nebulization 2 (two) times daily.     ARIPiprazole 10 MG tablet  Commonly known as:  ABILIFY  Take 10 mg by mouth daily. For depressive psychosis      ascorbic acid 500 MG tablet  Commonly known as:  VITAMIN C  Take 500 mg by mouth 2 (two) times daily.     benzonatate 200 MG capsule  Commonly known as:  TESSALON  Take 1 capsule (200 mg total) by mouth 3 (three) times daily as needed for cough.     budesonide-formoterol 160-4.5 MCG/ACT inhaler  Commonly known as:  SYMBICORT  Inhale 2 puffs into the lungs 2 (two) times daily.     busPIRone 10 MG tablet  Commonly known as:  BUSPAR  Take 10 mg by mouth 2 (two) times daily.     cloNIDine 0.2 MG tablet  Commonly known as:  CATAPRES  Take 0.2 mg by mouth 2 (two) times daily.     divalproex 250 MG 24 hr tablet  Commonly known as:  DEPAKOTE ER  Take 250 mg by mouth 2 (two) times daily.     DULoxetine 60 MG capsule  Commonly known as:  CYMBALTA  Take 60 mg by mouth 2 (two) times daily.     famotidine 20 MG tablet  Commonly known as:  PEPCID  Take 20 mg by mouth daily.     fluticasone 50 MCG/ACT nasal spray  Commonly known as:  FLONASE  Place 2 sprays into the nose daily.     isosorbide mononitrate 30 MG 24 hr tablet  Commonly known as:  IMDUR  Take 30 mg by mouth daily.     LORazepam 0.5 MG tablet  Commonly known as:  ATIVAN  Take 0.5 mg by mouth 2 (two) times daily as needed for anxiety.     nitroGLYCERIN 0.4 MG SL tablet  Commonly known as:  NITROSTAT  Place 0.4 mg under the tongue every 5 (five) minutes as needed for chest pain.     senna 8.6 MG Tabs tablet  Commonly known as:  SENOKOT  Take 2 tablets by mouth daily.     simethicone 80 MG chewable tablet  Commonly known as:  MYLICON  Chew 80 mg by mouth every 8 (eight) hours.     simvastatin 10 MG tablet  Commonly known as:  ZOCOR  Take 10 mg by mouth at bedtime.     sodium chloride 0.9 % SOLN 100 mL with imipenem-cilastatin 500 MG SOLR 500 mg  Inject 500 mg into the vein every 6 (six) hours.     tiotropium 18 MCG inhalation capsule  Commonly known as:  SPIRIVA  Place 18 mcg into inhaler and inhale daily.      vitamin D (CHOLECALCIFEROL) 400 UNITS tablet  Take 400 Units by mouth daily.       Allergies  Allergen Reactions  . Penicillins Anaphylaxis    Tongue swelling  . Ace Inhibitors Other (See Comments)    Per MAR  .  Aspirin Other (See Comments)    Per MAR  . Bee Venom Other (See Comments)    Unknown  . Lisinopril     Per MAR  . Nyquil Cold & [Dm-Doxylamine-Acetaminophen] Other (See Comments)    Unknown   . Ppd [Tuberculin Purified Protein Derivative] Other (See Comments)    unknown      The results of significant diagnostics from this hospitalization (including imaging, microbiology, ancillary and laboratory) are listed below for reference.    Significant Diagnostic Studies: Dg Abd 1 View  08/19/2013   CLINICAL DATA:  To confirm a NG tube placement  EXAM: ABDOMEN - 1 VIEW  COMPARISON:  Abdomen film from 05/04/2013  FINDINGS: The tip of the NG tube is in the region of the distal antrum -duodenum bulb. The bowel gas pattern is nonspecific. Surgical clips are present from prior cholecystectomy. Linear opacity at the left lung base is most consistent with atelectasis.  IMPRESSION: Tip of NG tube near the distal antrum -duodenal bulb region.   Electronically Signed   By: Ivar Drape M.D.   On: 08/19/2013 13:16   Ir Fluoro Guide Cv Line Right  08/22/2013   CLINICAL DATA:  ACCESS FOR LONG-TERM IV ANTIBIOTICS  EXAM: RIGHT UPPER EXTREMITY PICC LINE PLACEMENT WITH ULTRASOUND AND FLUOROSCOPIC GUIDANCE  FLUOROSCOPY TIME:  12 seconds  PROCEDURE: The patient was advised of the possible risks andcomplications and agreed to undergo the procedure. The patient was then brought to the angiographic suite for the procedure.  The right arm was prepped with chlorhexidine, drapedin the usual sterile fashion using maximum barrier technique (cap and mask, sterile gown, sterile gloves, large sterile sheet, hand hygiene and cutaneous antisepsis) and infiltrated locally with 1% Lidocaine.  Ultrasound  demonstrated patency of the bright basilic vein, and this was documented with an image. Under real-time ultrasound guidance, this vein was accessed with a 21 gauge micropuncture needle and image documentation was performed. A 0.018 wire was introduced in to the vein. Over this, a 5 [place] double lumen power PICC was advanced to the lower SVC/right atrial junction. Fluoroscopy during the procedure and fluoro spot radiograph confirms appropriate catheter position. The catheter was flushed and covered with asterile dressing.  Complications: No immediate  IMPRESSION: Successful right arm power PICC line placement with ultrasound and fluoroscopic guidance. The catheter is ready for use.   Electronically Signed   By: Daryll Brod M.D.   On: 08/22/2013 15:09   Ir US Guide Vasc Access Right  08/22/2013   CLINICAL DATA:  ACCESS FOR LONG-TERM IV ANTIBIOTICS  EXAM: RIGHT UPPER EXTREMITY PICC LINE PLACEMENT WITH ULTRASOUND AND FLUOROSCOPIC GUIDANCE  FLUOROSCOPY TIME:  12 seconds  PROCEDURE: The patient was advised of the possible risks andcomplications and agreed to undergo the procedure. The patient was then brought to the angiographic suite for the procedure.  The right arm was prepped with chlorhexidine, drapedin the usual sterile fashion using maximum barrier technique (cap and mask, sterile gown, sterile gloves, large sterile sheet, hand hygiene and cutaneous antisepsis) and infiltrated locally with 1% Lidocaine.  Ultrasound demonstrated patency of the bright basilic vein, and this was documented with an image. Under real-time ultrasound guidance, this vein was accessed with a 21 gauge micropuncture needle and image documentation was performed. A 0.018 wire was introduced in to the vein. Over this, a 5 [place] double lumen power PICC was advanced to the lower SVC/right atrial junction. Fluoroscopy during the procedure and fluoro spot radiograph confirms appropriate catheter position. The catheter was  flushed and covered  with asterile dressing.  Complications: No immediate  IMPRESSION: Successful right arm power PICC line placement with ultrasound and fluoroscopic guidance. The catheter is ready for use.   Electronically Signed   By: Daryll Brod M.D.   On: 08/22/2013 15:09   Dg Chest Port 1 View  08/22/2013   CLINICAL DATA:  Tracheostomy, shortness of breath.  EXAM: PORTABLE CHEST - 1 VIEW  COMPARISON:  None.  FINDINGS: Tracheostomy tube is unchanged. NG tube is in the stomach. Diffuse right lung airspace opacities. Left base atelectasis or infiltrate. Mild vascular congestion and cardiomegaly. Moderate right pleural effusion.  IMPRESSION: Bilateral airspace opacities, right greater than left with moderate right effusion.  Cardiomegaly with vascular congestion.  No significant change.   Electronically Signed   By: Rolm Baptise M.D.   On: 08/22/2013 05:53   Dg Chest Port 1 View  08/20/2013   CLINICAL DATA:  Right lower lobe collapse. Status post bronchoscopy.  EXAM: PORTABLE CHEST - 1 VIEW  COMPARISON:  Earlier today.  FINDINGS: The examination is limited by suboptimal technique. Tracheostomy tube in satisfactory position. No gross change in bilateral airspace opacity. Stable enlarged cardiac silhouette and prominent pulmonary vasculature. Nasogastric tube extending into the stomach. Thoracic spine degenerative changes.  IMPRESSION: Limited examination with no gross change in bilateral atelectasis or pneumonia, cardiomegaly and pulmonary vascular congestion. No gross pneumothorax following bronchoscopy.   Electronically Signed   By: Enrique Sack M.D.   On: 08/20/2013 12:42   Dg Chest Port 1 View  08/20/2013   CLINICAL DATA:  Shortness of breath.  No intubated.  EXAM: PORTABLE CHEST - 1 VIEW  COMPARISON:  Yesterday.  FINDINGS: Tracheostomy tube in satisfactory position. Nasogastric tube extending into the stomach. Stable enlarged cardiac silhouette. No significant change in bilateral airspace opacity. Decreased prominence of  the interstitial markings. Stable prominence of the pulmonary vasculature. Thoracic spine and right shoulder degenerative changes.  IMPRESSION: 1. No significant change in bilateral atelectasis or pneumonia. 2. Stable cardiomegaly and pulmonary vascular congestion. 3. Improved interstitial pulmonary edema.   Electronically Signed   By: Enrique Sack M.D.   On: 08/20/2013 07:55   Dg Chest Port 1 View  08/19/2013   CLINICAL DATA:  Evaluate tracheostomy tube positioning  EXAM: PORTABLE CHEST - 1 VIEW  COMPARISON:  DG CHEST 1V PORT dated 08/18/2013; DG CHEST 1V PORT dated 07/05/2013; CT ANGIO CHEST W/CM &/OR WO/CM dated 07/06/2013; DG CHEST 2 VIEW dated 07/03/2013  FINDINGS: Grossly unchanged enlarged cardiac silhouette and mediastinal contours given persistently reduced lung volumes. Stable positioning of support apparatus. No definite pneumothorax. Pulmonary vasculature is indistinct with cephalization of flow. Worsening perihilar and medial basilar heterogeneous opacities. Suspected increase in small/trace bilateral pleural effusions. Grossly unchanged bones.  IMPRESSION: 1. Stable positioning of support apparatus.  No pneumothorax. 2. Persistently reduced lung volumes with findings suggestive of worsening edema and bilateral atelectasis. 3. Suspected increase in small/trace bilateral effusions.   Electronically Signed   By: Sandi Mariscal M.D.   On: 08/19/2013 07:46   Dg Chest Port 1 View  08/18/2013   CLINICAL DATA:  SHORTNESS OF BREATH  EXAM: PORTABLE CHEST - 1 VIEW  COMPARISON:  CT ANGIO CHEST W/CM &/OR WO/CM dated 07/06/2013; DG CHEST 2 VIEW dated 07/03/2013  FINDINGS: Low lung volumes. There is prominence of interstitial markings, peribronchial cuffing, and indistinctness of the pulmonary vasculature. An endotracheal tube is identified with tip at the level of clavicles. Metallic density having the appearance of spurring projects  in the left lung apex likely reflecting an external device. The osseous structures  unremarkable. Cardiac silhouette is enlarged.  IMPRESSION: Interstitial findings as described above likely reflecting pulmonary edema. Findings are accentuated by the patient's low lung volumes. No focal regions of consolidation appreciated.   Electronically Signed   By: Margaree Mackintosh M.D.   On: 08/18/2013 11:39    Microbiology: Recent Results (from the past 240 hour(s))  CULTURE, BLOOD (ROUTINE X 2)     Status: None   Collection Time    08/18/13 12:30 PM      Result Value Ref Range Status   Specimen Description BLOOD RIGHT WRIST   Final   Special Requests BOTTLES DRAWN AEROBIC AND ANAEROBIC 2CC   Final   Culture  Setup Time     Final   Value: 08/18/2013 18:31     Performed at [facility]   Culture     Final   Value: NO GROWTH 5 DAYS     Performed at [facility]   Report Status 08/24/2013 FINAL   Final  RESPIRATORY VIRUS PANEL     Status: None   Collection Time    08/18/13  5:29 PM      Result Value Ref Range Status   Source - RVPAN TRACHEAL ASPIRATE   Final   Respiratory Syncytial Virus A NOT DETECTED   Final   Respiratory Syncytial Virus B NOT DETECTED   Final   Influenza A NOT DETECTED   Final   Influenza B NOT DETECTED   Final   Parainfluenza 1 NOT DETECTED   Final   Parainfluenza 2 NOT DETECTED   Final   Parainfluenza 3 NOT DETECTED   Final   Metapneumovirus NOT DETECTED   Final   Rhinovirus NOT DETECTED   Final   Adenovirus NOT DETECTED   Final   Influenza A H1 NOT DETECTED   Final   Influenza A H3 NOT DETECTED   Final   Comment: (NOTE)           Normal Reference Range for each Analyte: NOT DETECTED     Testing performed using the Luminex xTAG Respiratory Viral Panel test     kit.     This test was developed and its performance characteristics determined     by [facility]. It has not been cleared or approved by the [place]     Food and Drug Administration. This test is used for clinical purposes.     It should not be regarded as investigational  or for research. This     laboratory is certified under the [place] (CLIA) as qualified to perform high complexity     clinical laboratory testing.     Performed at [facility]     Status: None   Collection Time    08/18/13  5:30 PM      Result Value Ref Range Status   Specimen Description URINE, CATHETERIZED   Final   Special Requests NONE   Final   Culture  Setup Time     Final   Value: 08/19/2013 01:46     Performed at [facility]     Final   Value: NO GROWTH     Performed at [facility]   Culture     Final   Value: NO GROWTH     Performed at [facility]  Report Status 08/19/2013 FINAL   Final  CULTURE, RESPIRATORY (NON-EXPECTORATED)     Status: None   Collection Time    08/18/13  5:30 PM      Result Value Ref Range Status   Specimen Description TRACHEAL ASPIRATE   Final   Special Requests NONE   Final   Gram Stain     Final   Value: MODERATE WBC PRESENT, PREDOMINANTLY PMN     RARE SQUAMOUS EPITHELIAL CELLS PRESENT     NO ORGANISMS SEEN     Performed at [facility]   Culture     Final   Value: FEW STAPHYLOCOCCUS AUREUS     Note: RIFAMPIN AND GENTAMICIN SHOULD NOT BE USED AS SINGLE DRUGS FOR TREATMENT OF STAPH INFECTIONS.     RARE PSEUDOMONAS AERUGINOSA     Performed at [facility]   Report Status 08/22/2013 FINAL   Final   Organism ID, Bacteria STAPHYLOCOCCUS AUREUS   Final   Organism ID, Bacteria PSEUDOMONAS AERUGINOSA   Final  MRSA PCR SCREENING     Status: None   Collection Time    08/18/13  5:31 PM      Result Value Ref Range Status   MRSA by PCR NEGATIVE  NEGATIVE Final   Comment:            The GeneXpert MRSA Assay (FDA     approved for NASAL specimens     only), is one component of a     comprehensive MRSA colonization     surveillance program. It is not     intended to diagnose MRSA     infection nor to guide or     monitor  treatment for     MRSA infections.  CULTURE, BLOOD (ROUTINE X 2)     Status: None   Collection Time    08/18/13  7:55 PM      Result Value Ref Range Status   Specimen Description BLOOD LEFT ARM   Final   Special Requests BOTTLES DRAWN AEROBIC ONLY 5CC   Final   Culture  Setup Time     Final   Value: 08/19/2013 01:43     Performed at [facility]   Culture     Final   Value: NO GROWTH 5 DAYS     Performed at [facility]   Report Status 08/25/2013 FINAL   Final  BODY FLUID CULTURE     Status: None   Collection Time    08/20/13 12:20 PM      Result Value Ref Range Status   Specimen Description PLEURAL FLUID RIGHT   Final   Special Requests 10ML FLUID   Final   Gram Stain     Final   Value: MODERATE WBC PRESENT,BOTH PMN AND MONONUCLEAR     ABUNDANT GRAM NEGATIVE RODS     Gram Stain Report Called to,Read Back By and Verified With: Gram Stain Report Called to,Read Back By and Verified With:  PEPPA C AT 9485 ON 08/21/13 BY SMIAS     Performed at [facility]   Culture     Final   Value: ABUNDANT PSEUDOMONAS AERUGINOSA     MODERATE STAPHYLOCOCCUS AUREUS     Note: RIFAMPIN AND GENTAMICIN SHOULD NOT BE USED AS SINGLE DRUGS FOR TREATMENT OF STAPH INFECTIONS.     Performed at [facility]   Report Status 08/23/2013 FINAL   Final   Organism ID, Bacteria PSEUDOMONAS AERUGINOSA   Final  Organism ID, Bacteria STAPHYLOCOCCUS AUREUS   Final     Labs: Basic Metabolic Panel:  Recent Labs Lab 08/20/13 2138 08/21/13 0850 08/21/13 2113  08/22/13 0302 08/22/13 2140 08/23/13 1415 08/24/13 0508 08/25/13 1453 08/26/13 0415  NA  --   --   --   < > 147 145 142 144 142 145  K  --   --   --   < > 3.0* 3.1* 4.4 3.7 5.4* 4.8  CL  --   --   --   < > 93* 98 94* 100 94* 97  CO2  --   --   --   < > 43* 38* 39* 33* 38* 39*  GLUCOSE  --   --   --   < > 106* 126* 148* 105* 133* 124*  BUN  --   --   --   < > 28* 26* 26* 30* 36* 33*  CREATININE  --   --   --   < > 0.77  0.77 0.81 1.00 0.94 0.99  CALCIUM  --   --   --   < > 9.6 7.8* 9.3 7.7* 9.4 9.5  MG 1.8 1.7 1.7  --  1.8  --   --   --   --   --   PHOS 3.2 2.4 2.0*  --  2.7  --   --   --   --   --   < > = values in this interval not displayed. Liver Function Tests: No results found for this basename: AST, ALT, ALKPHOS, BILITOT, PROT, ALBUMIN,  in the last 168 hours No results found for this basename: LIPASE, AMYLASE,  in the last 168 hours No results found for this basename: AMMONIA,  in the last 168 hours CBC:  Recent Labs Lab 08/22/13 0302 08/23/13 1415 08/26/13 0415  WBC 23.3* 22.0* 9.4  HGB 11.9* 10.8* 9.7*  HCT 38.4 35.0* 33.1*  MCV 87.7 88.4 91.2  PLT 266 204 229   Cardiac Enzymes: No results found for this basename: CKTOTAL, CKMB, CKMBINDEX, TROPONINI,  in the last 168 hours BNP: BNP (last 3 results)  Recent Labs  07/03/13 1830 08/18/13 1230 08/19/13 0307  PROBNP 237.0* 932.0* 674.2*   CBG:  Recent Labs Lab 08/23/13 0745 08/23/13 1255 08/23/13 1619 08/23/13 2137 08/24/13 0817  GLUCAP 100* 119* 160* 162* 90       Signed:  Debbe Odea, MD  [facility] 08/27/2013, 10:31 AM

## 2013-08-28 ENCOUNTER — Non-Acute Institutional Stay (SKILLED_NURSING_FACILITY): Payer: Medicaid Other | Admitting: Internal Medicine

## 2013-08-28 DIAGNOSIS — J449 Chronic obstructive pulmonary disease, unspecified: Secondary | ICD-10-CM

## 2013-08-28 DIAGNOSIS — G4733 Obstructive sleep apnea (adult) (pediatric): Secondary | ICD-10-CM

## 2013-08-28 DIAGNOSIS — J9610 Chronic respiratory failure, unspecified whether with hypoxia or hypercapnia: Secondary | ICD-10-CM

## 2013-08-28 DIAGNOSIS — I5031 Acute diastolic (congestive) heart failure: Secondary | ICD-10-CM

## 2013-08-28 DIAGNOSIS — I509 Heart failure, unspecified: Secondary | ICD-10-CM

## 2013-08-31 ENCOUNTER — Encounter: Payer: Self-pay | Admitting: Internal Medicine

## 2013-08-31 NOTE — Progress Notes (Signed)
Patient ID: [name], female   DOB: 10/24/1950, 63 y.o.   MRN: [id number] Facility; [name] Chief complaint; review and readmission to the facility [facility] [place] 2/15 through 2/24 History; this is a patient who is a long-standing resident of this facility since May of 2014. She is a history of COPD and obstructive sleep apnea. She has a tracheostomy although I'm not completely certain of the history. She was admitted on this occasion in acute respiratory failure. She was felt to have pneumonia secondary to Pseudomonas. She was treated with Primaxin to finish on 2/25. It is noted that she had right lower lobe collapse on x-ray. She was initially intubated the. She was also diuresis due to.chronic diastolic heart failure.  Past Medical History  Diagnosis Date  . COPD with acute respiratory failure   . Hypertension   . Chronic respiratory failure     3 L oxygen  . GERD (gastroesophageal reflux disease)   . Diabetes mellitus   . Obesity, Class III, BMI 40-49.9 (morbid obesity)   . Obstructive sleep apnea on CPAP   . Peripheral vascular disease   . Bipolar disease, chronic   . Lymphedema   . COPD (chronic obstructive pulmonary disease)   . Cellulitis   . Coronary artery disease   . Asthma   . Myocardial infarction   . Vertigo     Past Surgical History  Procedure Laterality Date  . Abdominal hysterectomy    . Cholecystectomy    . Appendectomy    . Tubal ligation    . Tracheostomy      Current Outpatient Prescriptions on File Prior to Visit  Medication Sig Dispense Refill  . albuterol (PROVENTIL) (2.5 MG/3ML) 0.083% nebulizer solution Take 3 mLs (2.5 mg total) by nebulization every 4 (four) hours as needed for wheezing.  75 mL  12  . amLODipine (NORVASC) 10 MG tablet Take 10 mg by mouth daily.      Marland Kitchen. arformoterol (BROVANA) 15 MCG/2ML NEBU Take 2 mLs (15 mcg total) by nebulization 2 (two) times daily.  120 mL  6  . ARIPiprazole (ABILIFY) 10 MG tablet Take 10 mg by  mouth daily. For depressive psychosis      . ascorbic acid (VITAMIN C) 500 MG tablet Take 500 mg by mouth 2 (two) times daily.      . benzonatate (TESSALON) 200 MG capsule Take 1 capsule (200 mg total) by mouth 3 (three) times daily as needed for cough.  20 capsule  0  . budesonide-formoterol (SYMBICORT) 160-4.5 MCG/ACT inhaler Inhale 2 puffs into the lungs 2 (two) times daily.      . busPIRone (BUSPAR) 10 MG tablet Take 10 mg by mouth 2 (two) times daily.      . cloNIDine (CATAPRES) 0.2 MG tablet Take 0.2 mg by mouth 2 (two) times daily.      . divalproex (DEPAKOTE ER) 250 MG 24 hr tablet Take 250 mg by mouth 2 (two) times daily.      . DULoxetine (CYMBALTA) 60 MG capsule Take 60 mg by mouth 2 (two) times daily.      . famotidine (PEPCID) 20 MG tablet Take 20 mg by mouth daily.       . fluticasone (FLONASE) 50 MCG/ACT nasal spray Place 2 sprays into the nose daily.      . isosorbide mononitrate (IMDUR) 30 MG 24 hr tablet Take 30 mg by mouth daily.      Marland Kitchen. LORazepam (ATIVAN) 0.5 MG tablet Take one tablet  by mouth twice daily as needed for anxiety  60 tablet  5  . nitroGLYCERIN (NITROSTAT) 0.4 MG SL tablet Place 0.4 mg under the tongue every 5 (five) minutes as needed for chest pain.       Marland Kitchen senna (SENOKOT) 8.6 MG TABS Take 2 tablets by mouth daily.      . simethicone (MYLICON) 80 MG chewable tablet Chew 80 mg by mouth every 8 (eight) hours.      . simvastatin (ZOCOR) 10 MG tablet Take 10 mg by mouth at bedtime.      . sodium chloride 0.9 % SOLN 100 mL with imipenem-cilastatin 500 MG SOLR 500 mg Inject 500 mg into the vein every 6 (six) hours.  6 each  0  . tiotropium (SPIRIVA) 18 MCG inhalation capsule Place 18 mcg into inhaler and inhale daily.      . vitamin D, CHOLECALCIFEROL, 400 UNITS tablet Take 400 Units by mouth daily.       No current facility-administered medications on file prior to visit.   Review of systems Gen. the patient states she is not feeling well. HEENT complains of  bifrontal headache Respiratory complaining of shortness of breath or cough Cardiac no clear chest pain GI no abdominal pain no diarrhea  Physical examination Gen. the patient looks unwell Vitals; respiratory rate 32 pulse 100 O2 sats 88% Respiratory decreased air entry bilaterally. Expiratory wheezing per Cardiac heart sounds are distant. There is no signs of congestive heart failure Abdomen; obese but no liver no spleen no tenderness of GU her there is no suprapubic tenderness or fullness no CVA tenderness Skin; extreme venous stasis is seen. She is a Coban wrap on the left leg. No wounds are seen. Minimal edema  Impression/plan #1 COPD acute; I have not really felt that this woman was at stable. I have her moved her tracheostomy cap. Pending she'll need routine nebulizers and probably routine suctioning. At this point I don't think any further antibiotics are indicated. With the suctioning of her  it appears that she is a more stable. Routine nebulizers have been or #2 diastolic heart failure; I see no evidence of this currently although she is not easy to see relevant landmarks. She is not on any current diuretic #3 bipolar affective disorder;

## 2013-08-31 NOTE — Progress Notes (Signed)
This encounter was created in error - please disregard.

## 2013-09-07 ENCOUNTER — Emergency Department (HOSPITAL_COMMUNITY): Payer: Medicaid Other

## 2013-09-07 ENCOUNTER — Inpatient Hospital Stay (HOSPITAL_COMMUNITY)
Admission: EM | Admit: 2013-09-07 | Discharge: 2013-09-24 | DRG: 208 | Disposition: A | Discharge status: Skilled Nursing Facility | Payer: Medicaid Other | Attending: Pulmonary Disease | Admitting: Pulmonary Disease

## 2013-09-07 ENCOUNTER — Encounter (HOSPITAL_COMMUNITY): Payer: Self-pay | Admitting: Emergency Medicine

## 2013-09-07 DIAGNOSIS — J309 Allergic rhinitis, unspecified: Secondary | ICD-10-CM

## 2013-09-07 DIAGNOSIS — G4733 Obstructive sleep apnea (adult) (pediatric): Secondary | ICD-10-CM

## 2013-09-07 DIAGNOSIS — Z930 Tracheostomy status: Secondary | ICD-10-CM

## 2013-09-07 DIAGNOSIS — D638 Anemia in other chronic diseases classified elsewhere: Secondary | ICD-10-CM | POA: Diagnosis present

## 2013-09-07 DIAGNOSIS — Z9981 Dependence on supplemental oxygen: Secondary | ICD-10-CM

## 2013-09-07 DIAGNOSIS — J449 Chronic obstructive pulmonary disease, unspecified: Secondary | ICD-10-CM | POA: Diagnosis present

## 2013-09-07 DIAGNOSIS — E1149 Type 2 diabetes mellitus with other diabetic neurological complication: Secondary | ICD-10-CM | POA: Diagnosis present

## 2013-09-07 DIAGNOSIS — J4489 Other specified chronic obstructive pulmonary disease: Secondary | ICD-10-CM

## 2013-09-07 DIAGNOSIS — J9620 Acute and chronic respiratory failure, unspecified whether with hypoxia or hypercapnia: Principal | ICD-10-CM

## 2013-09-07 DIAGNOSIS — I5031 Acute diastolic (congestive) heart failure: Secondary | ICD-10-CM

## 2013-09-07 DIAGNOSIS — I10 Essential (primary) hypertension: Secondary | ICD-10-CM | POA: Diagnosis present

## 2013-09-07 DIAGNOSIS — K219 Gastro-esophageal reflux disease without esophagitis: Secondary | ICD-10-CM

## 2013-09-07 DIAGNOSIS — F411 Generalized anxiety disorder: Secondary | ICD-10-CM

## 2013-09-07 DIAGNOSIS — J45901 Unspecified asthma with (acute) exacerbation: Secondary | ICD-10-CM

## 2013-09-07 DIAGNOSIS — J441 Chronic obstructive pulmonary disease with (acute) exacerbation: Secondary | ICD-10-CM

## 2013-09-07 DIAGNOSIS — J9690 Respiratory failure, unspecified, unspecified whether with hypoxia or hypercapnia: Secondary | ICD-10-CM | POA: Diagnosis present

## 2013-09-07 DIAGNOSIS — R42 Dizziness and giddiness: Secondary | ICD-10-CM

## 2013-09-07 DIAGNOSIS — E662 Morbid (severe) obesity with alveolar hypoventilation: Secondary | ICD-10-CM | POA: Diagnosis present

## 2013-09-07 DIAGNOSIS — I252 Old myocardial infarction: Secondary | ICD-10-CM

## 2013-09-07 DIAGNOSIS — J9600 Acute respiratory failure, unspecified whether with hypoxia or hypercapnia: Secondary | ICD-10-CM

## 2013-09-07 DIAGNOSIS — E785 Hyperlipidemia, unspecified: Secondary | ICD-10-CM

## 2013-09-07 DIAGNOSIS — Z6841 Body Mass Index (BMI) 40.0 and over, adult: Secondary | ICD-10-CM

## 2013-09-07 DIAGNOSIS — J069 Acute upper respiratory infection, unspecified: Secondary | ICD-10-CM | POA: Diagnosis present

## 2013-09-07 DIAGNOSIS — I2510 Atherosclerotic heart disease of native coronary artery without angina pectoris: Secondary | ICD-10-CM

## 2013-09-07 DIAGNOSIS — F319 Bipolar disorder, unspecified: Secondary | ICD-10-CM | POA: Diagnosis present

## 2013-09-07 LAB — COMPREHENSIVE METABOLIC PANEL
ALBUMIN: 3.1 g/dL — AB (ref 3.5–5.2)
ALK PHOS: 61 U/L (ref 39–117)
ALT: 11 U/L (ref 0–35)
AST: 24 U/L (ref 0–37)
BUN: 12 mg/dL (ref 6–23)
CO2: 38 mEq/L — ABNORMAL HIGH (ref 19–32)
Calcium: 9.4 mg/dL (ref 8.4–10.5)
Chloride: 95 mEq/L — ABNORMAL LOW (ref 96–112)
Creatinine, Ser: 0.7 mg/dL (ref 0.50–1.10)
GFR calc Af Amer: >90 mL/min (ref >90)
GFR calc non Af Amer: 90 mL/min — ABNORMAL LOW (ref >90)
Glucose, Bld: 123 mg/dL — ABNORMAL HIGH (ref 70–99)
POTASSIUM: 4.8 meq/L (ref 3.7–5.3)
Sodium: 143 mEq/L (ref 137–147)
TOTAL PROTEIN: 6.8 g/dL (ref 6.0–8.3)
Total Bilirubin: <0.2 mg/dL — ABNORMAL LOW (ref 0.3–1.2)

## 2013-09-07 LAB — CBC WITH DIFFERENTIAL/PLATELET
BASOS ABS: 0 10*3/uL (ref 0.0–0.1)
BASOS PCT: 0 % (ref 0–1)
Eosinophils Absolute: 0.2 10*3/uL (ref 0.0–0.7)
Eosinophils Relative: 2 % (ref 0–5)
HCT: 32.9 % — ABNORMAL LOW (ref 36.0–46.0)
Hemoglobin: 10.2 g/dL — ABNORMAL LOW (ref 12.0–15.0)
Lymphocytes Relative: 23 % (ref 12–46)
Lymphs Abs: 1.8 10*3/uL (ref 0.7–4.0)
MCH: 26.8 pg (ref 26.0–34.0)
MCHC: 31 g/dL (ref 30.0–36.0)
MCV: 86.6 fL (ref 78.0–100.0)
MONOS PCT: 4 % (ref 3–12)
Monocytes Absolute: 0.3 10*3/uL (ref 0.1–1.0)
Neutro Abs: 5.3 10*3/uL (ref 1.7–7.7)
Neutrophils Relative %: 70 % (ref 43–77)
Platelets: 124 10*3/uL — ABNORMAL LOW (ref 150–400)
RBC: 3.8 MIL/uL — ABNORMAL LOW (ref 3.87–5.11)
RDW: 15.4 % (ref 11.5–15.5)
WBC: 7.6 10*3/uL (ref 4.0–10.5)

## 2013-09-07 LAB — I-STAT ARTERIAL BLOOD GAS, ED
Acid-Base Excess: 16 mmol/L — ABNORMAL HIGH (ref 0.0–2.0)
Bicarbonate: 46.2 mEq/L — ABNORMAL HIGH (ref 20.0–24.0)
O2 Saturation: 92 %
PH ART: 7.287 — AB (ref 7.350–7.450)
TCO2: 49 mmol/L (ref 0–100)
pCO2 arterial: 96.8 mmHg (ref 35.0–45.0)
pO2, Arterial: 76 mmHg — ABNORMAL LOW (ref 80.0–100.0)

## 2013-09-07 LAB — URINALYSIS, ROUTINE W REFLEX MICROSCOPIC
Bilirubin Urine: NEGATIVE
GLUCOSE, UA: NEGATIVE mg/dL
Hgb urine dipstick: NEGATIVE
KETONES UR: NEGATIVE mg/dL
Nitrite: NEGATIVE
PH: 5 (ref 5.0–8.0)
Protein, ur: NEGATIVE mg/dL
Specific Gravity, Urine: 1.015 (ref 1.005–1.030)
Urobilinogen, UA: 0.2 mg/dL (ref 0.0–1.0)

## 2013-09-07 LAB — URINE MICROSCOPIC-ADD ON

## 2013-09-07 LAB — D-DIMER, QUANTITATIVE (NOT AT ARMC): D DIMER QUANT: 1.28 ug{FEU}/mL — AB (ref 0.00–0.48)

## 2013-09-07 LAB — I-STAT CG4 LACTIC ACID, ED: Lactic Acid, Venous: 1.55 mmol/L (ref 0.5–2.2)

## 2013-09-07 MED ORDER — SENNA 8.6 MG PO TABS
2.0000 | ORAL_TABLET | Freq: Every day | ORAL | Status: DC
  Administered 2013-09-08 – 2013-09-24 (×17): 17.2 mg via ORAL
  Filled 2013-09-07 (×17): qty 2

## 2013-09-07 MED ORDER — METHYLPREDNISOLONE SODIUM SUCC 125 MG IJ SOLR: 60.0000 mg | Freq: Two times a day (BID) | INTRAMUSCULAR | Status: DC

## 2013-09-07 MED ORDER — FAMOTIDINE 20 MG PO TABS
20.0000 mg | ORAL_TABLET | Freq: Every day | ORAL | Status: DC
  Administered 2013-09-08 – 2013-09-24 (×17): 20 mg via ORAL
  Filled 2013-09-07 (×17): qty 1

## 2013-09-07 MED ORDER — SODIUM CHLORIDE 0.9 % IV SOLN
INTRAVENOUS | Status: DC
  Administered 2013-09-07: 10 mL/h via INTRAVENOUS

## 2013-09-07 MED ORDER — ARFORMOTEROL TARTRATE 15 MCG/2ML IN NEBU
15.0000 ug | INHALATION_SOLUTION | Freq: Two times a day (BID) | RESPIRATORY_TRACT | Status: DC
  Administered 2013-09-08 – 2013-09-24 (×32): 15 ug via RESPIRATORY_TRACT
  Filled 2013-09-07 (×36): qty 2

## 2013-09-07 MED ORDER — BUSPIRONE HCL 10 MG PO TABS
10.0000 mg | ORAL_TABLET | Freq: Two times a day (BID) | ORAL | Status: DC
  Administered 2013-09-08 – 2013-09-24 (×33): 10 mg via ORAL
  Filled 2013-09-07 (×34): qty 1

## 2013-09-07 MED ORDER — DIVALPROEX SODIUM ER 250 MG PO TB24
250.0000 mg | ORAL_TABLET | Freq: Two times a day (BID) | ORAL | Status: DC
  Administered 2013-09-08 – 2013-09-24 (×33): 250 mg via ORAL
  Filled 2013-09-07 (×35): qty 1

## 2013-09-07 MED ORDER — ALBUTEROL SULFATE (2.5 MG/3ML) 0.083% IN NEBU: 2.5000 mg | INHALATION_SOLUTION | Freq: Four times a day (QID) | RESPIRATORY_TRACT | Status: DC | PRN

## 2013-09-07 MED ORDER — FLUTICASONE PROPIONATE 50 MCG/ACT NA SUSP
2.0000 | Freq: Every day | NASAL | Status: DC
  Administered 2013-09-08 – 2013-09-23 (×16): 2 via NASAL
  Filled 2013-09-07: qty 16

## 2013-09-07 MED ORDER — DULOXETINE HCL 60 MG PO CPEP
60.0000 mg | ORAL_CAPSULE | Freq: Two times a day (BID) | ORAL | Status: DC
  Administered 2013-09-08 – 2013-09-24 (×33): 60 mg via ORAL
  Filled 2013-09-07 (×34): qty 1

## 2013-09-07 MED ORDER — ISOSORBIDE MONONITRATE ER 30 MG PO TB24
30.0000 mg | ORAL_TABLET | Freq: Every day | ORAL | Status: DC
  Administered 2013-09-08 – 2013-09-24 (×17): 30 mg via ORAL
  Filled 2013-09-07 (×17): qty 1

## 2013-09-07 MED ORDER — IOHEXOL 350 MG/ML SOLN
100.0000 mL | Freq: Once | INTRAVENOUS | Status: AC | PRN
  Administered 2013-09-07: 100 mL via INTRAVENOUS

## 2013-09-07 MED ORDER — ARIPIPRAZOLE 10 MG PO TABS
10.0000 mg | ORAL_TABLET | Freq: Every day | ORAL | Status: DC
  Administered 2013-09-08 – 2013-09-24 (×17): 10 mg via ORAL
  Filled 2013-09-07 (×17): qty 1

## 2013-09-07 MED ORDER — AMLODIPINE BESYLATE 10 MG PO TABS
10.0000 mg | ORAL_TABLET | Freq: Every day | ORAL | Status: DC
  Administered 2013-09-08 – 2013-09-24 (×17): 10 mg via ORAL
  Filled 2013-09-07 (×17): qty 1

## 2013-09-07 MED ORDER — METHYLPREDNISOLONE SODIUM SUCC 125 MG IJ SOLR
125.0000 mg | Freq: Once | INTRAMUSCULAR | Status: AC
  Administered 2013-09-07: 125 mg via INTRAVENOUS
  Filled 2013-09-07: qty 2

## 2013-09-07 MED ORDER — IPRATROPIUM BROMIDE 0.02 % IN SOLN
0.5000 mg | Freq: Once | RESPIRATORY_TRACT | Status: AC
  Administered 2013-09-07: 0.5 mg via RESPIRATORY_TRACT
  Filled 2013-09-07: qty 2.5

## 2013-09-07 MED ORDER — SIMETHICONE 80 MG PO CHEW
80.0000 mg | CHEWABLE_TABLET | Freq: Three times a day (TID) | ORAL | Status: DC
  Administered 2013-09-08 – 2013-09-24 (×49): 80 mg via ORAL
  Filled 2013-09-07 (×54): qty 1

## 2013-09-07 MED ORDER — DOXYCYCLINE HYCLATE 100 MG PO TABS: 100.0000 mg | ORAL_TABLET | Freq: Two times a day (BID) | ORAL | Status: DC

## 2013-09-07 MED ORDER — NITROGLYCERIN 0.4 MG SL SUBL
0.4000 mg | SUBLINGUAL_TABLET | SUBLINGUAL | Status: DC | PRN
  Administered 2013-09-08 – 2013-09-09 (×2): 0.4 mg via SUBLINGUAL
  Filled 2013-09-07: qty 1

## 2013-09-07 MED ORDER — CLONIDINE HCL 0.2 MG PO TABS
0.2000 mg | ORAL_TABLET | Freq: Two times a day (BID) | ORAL | Status: DC
  Administered 2013-09-08 – 2013-09-24 (×33): 0.2 mg via ORAL
  Filled 2013-09-07 (×34): qty 1

## 2013-09-07 MED ORDER — BENZONATATE 100 MG PO CAPS
200.0000 mg | ORAL_CAPSULE | Freq: Three times a day (TID) | ORAL | Status: DC | PRN
  Administered 2013-09-15 – 2013-09-20 (×3): 200 mg via ORAL
  Filled 2013-09-07 (×4): qty 2

## 2013-09-07 MED ORDER — BUDESONIDE-FORMOTEROL FUMARATE 160-4.5 MCG/ACT IN AERO
2.0000 | INHALATION_SPRAY | Freq: Two times a day (BID) | RESPIRATORY_TRACT | Status: DC
  Filled 2013-09-07: qty 6

## 2013-09-07 MED ORDER — ALBUTEROL SULFATE (2.5 MG/3ML) 0.083% IN NEBU
5.0000 mg | INHALATION_SOLUTION | Freq: Once | RESPIRATORY_TRACT | Status: AC
  Administered 2013-09-07: 5 mg via RESPIRATORY_TRACT
  Filled 2013-09-07: qty 6

## 2013-09-07 MED ORDER — SIMVASTATIN 10 MG PO TABS
10.0000 mg | ORAL_TABLET | Freq: Every day | ORAL | Status: DC
  Administered 2013-09-08 – 2013-09-23 (×16): 10 mg via ORAL
  Filled 2013-09-07 (×17): qty 1

## 2013-09-07 MED ORDER — TIOTROPIUM BROMIDE MONOHYDRATE 18 MCG IN CAPS
18.0000 ug | ORAL_CAPSULE | Freq: Every day | RESPIRATORY_TRACT | Status: DC
  Filled 2013-09-07: qty 5

## 2013-09-07 NOTE — ED Notes (Signed)
Patient presents to ED from [facility] with complaints of shortness of breath. Patient already has trach in place.

## 2013-09-07 NOTE — H&P (Signed)
[facility] History and Physical  Alice DanielsCarolyn R Leifheit ZOX:[id number] DOB: 09/01/1950 DOA: 09/07/2013  Referring physician: EDP PCP: Karlene EinsteinASANAYAKA,GAYANI, MD   Chief Complaint: Acute respiratory failure   HPI: [name] is a 63 y.o. female who was just discharged on 2/25 from the hospital.  She had pseudomonas PNA during that admit, complicated by requiring intubation.  She has a chronic trach in place due to COPD and OSA on 3L home O2 at baseline.  She is responsive on arrival but somewhat sleepy.  Her SOB has been ongoing for the past 24 hours, associated with productive cough.  Work up in the ED did not show a PNA this time, she is being admitted for COPD exacerbation.  Review of Systems: Systems reviewed.  As above, otherwise negative  Past Medical History  Diagnosis Date  . COPD   . Hypertension   . Chronic respiratory failure     3 L oxygen  . GERD (gastroesophageal reflux disease)   . Diabetes mellitus   . Obesity, Class III, BMI 40-49.9 (morbid obesity)   . Obstructive sleep apnea on CPAP   . Peripheral vascular disease   . Bipolar disease, chronic   . Lymphedema   . COPD (chronic obstructive pulmonary disease)   . Cellulitis   . Coronary artery disease   . Asthma   . Myocardial infarction   . Vertigo    Past Surgical History  Procedure Laterality Date  . Abdominal hysterectomy    . Cholecystectomy    . Appendectomy    . Tubal ligation    . Tracheostomy     Social History:  reports that she has never smoked. She has never used smokeless tobacco. She reports that she does not drink alcohol or use illicit drugs.  Allergies  Allergen Reactions  . Penicillins Anaphylaxis    Tongue swelling  . Ace Inhibitors Other (See Comments)    Per MAR  . Aspirin Other (See Comments)    Per MAR  . Bee Venom Other (See Comments)    Unknown  . Lisinopril     Per MAR  . Nyquil Cold & [Dm-Doxylamine-Acetaminophen] Other (See Comments)    Unknown   . Ppd  [Tuberculin Purified Protein Derivative] Other (See Comments)    unknown    Family History  Problem Relation Age of Onset  . Prostate cancer       Prior to Admission medications   Medication Sig Start Date End Date Taking? Authorizing Provider  albuterol (PROVENTIL) (2.5 MG/3ML) 0.083% nebulizer solution Take 2.5 mg by nebulization every 6 (six) hours as needed for wheezing or shortness of breath.   Yes Historical Provider, MD  amLODipine (NORVASC) 10 MG tablet Take 10 mg by mouth daily.   Yes Historical Provider, MD  arformoterol (BROVANA) 15 MCG/2ML NEBU Take 15 mcg by nebulization 2 (two) times daily.   Yes Historical Provider, MD  ARIPiprazole (ABILIFY) 10 MG tablet Take 10 mg by mouth daily. For depressive psychosis   Yes Historical Provider, MD  ascorbic acid (VITAMIN C) 500 MG tablet Take 500 mg by mouth 2 (two) times daily.   Yes Historical Provider, MD  benzonatate (TESSALON) 200 MG capsule Take 200 mg by mouth 3 (three) times daily as needed for cough.   Yes Historical Provider, MD  budesonide-formoterol (SYMBICORT) 160-4.5 MCG/ACT inhaler Inhale 2 puffs into the lungs 2 (two) times daily.   Yes Historical Provider, MD  busPIRone (BUSPAR) 10 MG tablet Take 10 mg by mouth 2 (two) times  daily.   Yes Historical Provider, MD  cloNIDine (CATAPRES) 0.2 MG tablet Take 0.2 mg by mouth 2 (two) times daily.   Yes Historical Provider, MD  divalproex (DEPAKOTE ER) 250 MG 24 hr tablet Take 250 mg by mouth 2 (two) times daily.   Yes Historical Provider, MD  DULoxetine (CYMBALTA) 60 MG capsule Take 60 mg by mouth 2 (two) times daily.   Yes Historical Provider, MD  famotidine (PEPCID) 20 MG tablet Take 20 mg by mouth daily.    Yes Historical Provider, MD  fluticasone (FLONASE) 50 MCG/ACT nasal spray Place 2 sprays into the nose daily.   Yes Historical Provider, MD  isosorbide mononitrate (IMDUR) 30 MG 24 hr tablet Take 30 mg by mouth daily.   Yes Historical Provider, MD  LORazepam (ATIVAN) 0.5 MG  tablet Take 0.5 mg by mouth every 12 (twelve) hours as needed for anxiety.   Yes Historical Provider, MD  nitroGLYCERIN (NITROSTAT) 0.4 MG SL tablet Place 0.4 mg under the tongue every 5 (five) minutes as needed for chest pain.    Yes Historical Provider, MD  senna (SENOKOT) 8.6 MG TABS Take 2 tablets by mouth daily.   Yes Historical Provider, MD  simethicone (MYLICON) 80 MG chewable tablet Chew 80 mg by mouth every 8 (eight) hours.   Yes Historical Provider, MD  simvastatin (ZOCOR) 10 MG tablet Take 10 mg by mouth at bedtime.   Yes Historical Provider, MD  tiotropium (SPIRIVA) 18 MCG inhalation capsule Place 18 mcg into inhaler and inhale daily.   Yes Historical Provider, MD  vitamin D, CHOLECALCIFEROL, 400 UNITS tablet Take 400 Units by mouth daily.   Yes Historical Provider, MD   Physical Exam: Filed Vitals:   09/07/13 2215  BP: 118/44  Pulse: 87  Temp:   Resp: 18    BP 118/44  Pulse 87  Temp(Src) 99.2 F (37.3 C) (Rectal)  Resp 18  Ht 5\' 4"  (1.626 m)  Wt 131.543 kg (290 lb)  BMI 49.75 kg/m2  SpO2 100%  General Appearance:    Sleepy but wakes up fairly easily, oriented, no distress, appears stated age  Head:    Normocephalic, atraumatic  Eyes:    PERRL, EOMI, sclera non-icteric        Nose:   Nares without drainage or epistaxis. Mucosa, turbinates normal  Throat:   Moist mucous membranes. Oropharynx without erythema or exudate.  Neck:   Supple. No carotid bruits.  No thyromegaly.  No lymphadenopathy.   Back:     No CVA tenderness, no spinal tenderness  Lungs:     Clear to auscultation bilaterally, without wheezes, rhonchi or rales  Chest wall:    No tenderness to palpitation  Heart:    Regular rate and rhythm without murmurs, gallops, rubs  Abdomen:     Soft, non-tender, nondistended, normal bowel sounds, no organomegaly  Genitalia:    deferred  Rectal:    deferred  Extremities:   No clubbing, cyanosis or edema.  Pulses:   2+ and symmetric all extremities  Skin:   Skin  color, texture, turgor normal, no rashes or lesions  Lymph nodes:   Cervical, supraclavicular, and axillary nodes normal  Neurologic:   CNII-XII intact. Normal strength, sensation and reflexes      throughout    Labs on Admission:  Basic Metabolic Panel:  Recent Labs Lab 09/07/13 1608  NA 143  K 4.8  CL 95*  CO2 38*  GLUCOSE 123*  BUN 12  CREATININE 0.70  CALCIUM  9.4   Liver Function Tests:  Recent Labs Lab 09/07/13 1608  AST 24  ALT 11  ALKPHOS 61  BILITOT <0.2*  PROT 6.8  ALBUMIN 3.1*   No results found for this basename: LIPASE, AMYLASE,  in the last 168 hours No results found for this basename: AMMONIA,  in the last 168 hours CBC:  Recent Labs Lab 09/07/13 1608  WBC 7.6  NEUTROABS 5.3  HGB 10.2*  HCT 32.9*  MCV 86.6  PLT 124*   Cardiac Enzymes: No results found for this basename: CKTOTAL, CKMB, CKMBINDEX, TROPONINI,  in the last 168 hours  BNP (last 3 results)  Recent Labs  07/03/13 1830 08/18/13 1230 08/19/13 0307  PROBNP 237.0* 932.0* 674.2*   CBG: No results found for this basename: GLUCAP,  in the last 168 hours  Radiological Exams on Admission: Ct Angio Chest Pe W/cm &/or Wo Cm  09/07/2013   CLINICAL DATA:  Chest pain and shortness of breath  EXAM: CT ANGIOGRAPHY CHEST WITH CONTRAST  TECHNIQUE: Multidetector CT imaging of the chest was performed using the standard protocol during bolus administration of intravenous contrast. Multiplanar CT image reconstructions and MIPs were obtained to evaluate the vascular anatomy.  CONTRAST:  07/06/2013  COMPARISON:  None.  FINDINGS: THORACIC INLET/BODY WALL:  Visible portions of a tracheostomy are normally seated. There is persisting left axillary in deep pectoral lymphadenopathy. The nodes appear stable in size, with an axillary node measuring 28 x 16 mm  MEDIASTINUM:  Cardiomegaly. No pericardial effusion. No mediastinal adenopathy. Negative for aortic dissection or aneurysm. There is no definitive  pulmonary embolism. Evaluation in the right upper lobe is difficult due to unpredictable streak artifact from dense contrast within the SVC. The areas of pulmonary arterial decrease in attenuation do not have the typical shape for an acute thrombus within the center of the vessel. Additionally, the neighboring pulmonary veins have similar alternating density.  LUNG WINDOWS:  Bibasilar atelectasis, worse on the right secondary to chronically elevated diaphragm. No evidence of edema or consolidation. No effusion or pneumothorax.  UPPER ABDOMEN:  No acute findings.  OSSEOUS:  No acute fracture.  No suspicious lytic or blastic lesions.  Review of the MIP images confirms the above findings.  IMPRESSION: 1. No definitive pulmonary embolism. 2. Chronic hypoaeration with bibasilar atelectasis. 3. Chronic left axillary and deep pectoral adenopathy without interval change.   Electronically Signed   By: Tiburcio Pea M.D.   On: 09/07/2013 20:34   Dg Chest Port 1 View  09/07/2013   CLINICAL DATA:  Chest pain.  EXAM: PORTABLE CHEST - 1 VIEW  COMPARISON:  08/22/2013  FINDINGS: Tracheostomy tube is 4.2 cm above the carina. Low lung volumes. Prominent lung markings and prominent heart size are likely related to the low lung volumes. No focal airspace disease.  IMPRESSION: Low lung volumes without focal disease.   Electronically Signed   By: Richarda Overlie M.D.   On: 09/07/2013 15:52    EKG: Independently reviewed.  Assessment/Plan Principal Problem:   Respiratory failure, acute and chronic Active Problems:   OBSTRUCTIVE SLEEP APNEA   COPD (chronic obstructive pulmonary disease)   1. 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  ABG has come back showing acute on chronic hypercapnic respiratory failure.  Patient has actually been placed on a vent at this point via trach mask.  Willing to take patient to SDU, but if patient is on a vent then PCCM will  have to be  consulted for vent management (as i dont believe that my hospital privilages allow me to manage a vent solo).  Have called and spoke with Dr. Darrick Penna and she is sending someone down to consult on patient.  Unlike last admit, patient CT chest shows no evidence of PNA. 2. COPD exacerbation - have patient on IV solumedrol, breathing treatments    Code Status: Full Code  Family Communication: Daughter at bedside Disposition Plan: Admit to SDU   Time spent: 70 min  Cace Osorto M. [facility] Pager [phone]  If 7AM-7PM, please contact the day team taking care of the patient [email or web address] Password [facility] 09/07/2013, 11:24 PM

## 2013-09-07 NOTE — ED Provider Notes (Signed)
CSN: [id number]     Arrival date & time 09/07/13  1433 History   First MD Initiated Contact with Patient 09/07/13 [phone]     Chief Complaint  Patient presents with  . Shortness of Breath     (Consider location/radiation/quality/duration/timing/severity/associated sxs/prior Treatment) Patient is a 63 y.o. female presenting with shortness of breath. The history is provided by the patient.  Shortness of Breath  patient here complaining of shortness of breath x24 hours with associated cough. History of similar symptoms associated with having pneumonia in the past. Denies any fever or chills. No vomiting. Some loose stools noted. Used albuterol inhaler without relief. Denies any anginal type chest pain but has noted some chest wall pain worse with coughing and movement.  Past Medical History  Diagnosis Date  . COPD   . Hypertension   . Chronic respiratory failure     3 L oxygen  . GERD (gastroesophageal reflux disease)   . Diabetes mellitus   . Obesity, Class III, BMI 40-49.9 (morbid obesity)   . Obstructive sleep apnea on CPAP   . Peripheral vascular disease   . Bipolar disease, chronic   . Lymphedema   . COPD (chronic obstructive pulmonary disease)   . Cellulitis   . Coronary artery disease   . Asthma   . Myocardial infarction   . Vertigo    Past Surgical History  Procedure Laterality Date  . Abdominal hysterectomy    . Cholecystectomy    . Appendectomy    . Tubal ligation    . Tracheostomy     Family History  Problem Relation Age of Onset  . Prostate cancer     History  Substance Use Topics  . Smoking status: Never Smoker   . Smokeless tobacco: Never Used  . Alcohol Use: No   OB History   Grav Para Term Preterm Abortions TAB SAB Ect Mult Living                 Review of Systems  Respiratory: Positive for shortness of breath.   All other systems reviewed and are negative.      Allergies  Penicillins; Ace inhibitors; Aspirin; Bee venom; Lisinopril; Nyquil  cold &; and Ppd  Home Medications   Current Outpatient Rx  Name  Route  Sig  Dispense  Refill  . albuterol (PROVENTIL) (2.5 MG/3ML) 0.083% nebulizer solution   Nebulization   Take 3 mLs (2.5 mg total) by nebulization every 4 (four) hours as needed for wheezing.   75 mL   12   . amLODipine (NORVASC) 10 MG tablet   Oral   Take 10 mg by mouth daily.         Marland Kitchen arformoterol (BROVANA) 15 MCG/2ML NEBU   Nebulization   Take 2 mLs (15 mcg total) by nebulization 2 (two) times daily.   120 mL   6   . ARIPiprazole (ABILIFY) 10 MG tablet   Oral   Take 10 mg by mouth daily. For depressive psychosis         . ascorbic acid (VITAMIN C) 500 MG tablet   Oral   Take 500 mg by mouth 2 (two) times daily.         . benzonatate (TESSALON) 200 MG capsule   Oral   Take 1 capsule (200 mg total) by mouth 3 (three) times daily as needed for cough.   20 capsule   0   . budesonide-formoterol (SYMBICORT) 160-4.5 MCG/ACT inhaler   Inhalation   Inhale 2  puffs into the lungs 2 (two) times daily.         . busPIRone (BUSPAR) 10 MG tablet   Oral   Take 10 mg by mouth 2 (two) times daily.         . cloNIDine (CATAPRES) 0.2 MG tablet   Oral   Take 0.2 mg by mouth 2 (two) times daily.         . divalproex (DEPAKOTE ER) 250 MG 24 hr tablet   Oral   Take 250 mg by mouth 2 (two) times daily.         . DULoxetine (CYMBALTA) 60 MG capsule   Oral   Take 60 mg by mouth 2 (two) times daily.         . famotidine (PEPCID) 20 MG tablet   Oral   Take 20 mg by mouth daily.          . fluticasone (FLONASE) 50 MCG/ACT nasal spray   Nasal   Place 2 sprays into the nose daily.         . isosorbide mononitrate (IMDUR) 30 MG 24 hr tablet   Oral   Take 30 mg by mouth daily.         Marland Kitchen LORazepam (ATIVAN) 0.5 MG tablet      Take one tablet by mouth twice daily as needed for anxiety   60 tablet   5   . nitroGLYCERIN (NITROSTAT) 0.4 MG SL tablet   Sublingual   Place 0.4 mg under the  tongue every 5 (five) minutes as needed for chest pain.          Marland Kitchen senna (SENOKOT) 8.6 MG TABS   Oral   Take 2 tablets by mouth daily.         . simethicone (MYLICON) 80 MG chewable tablet   Oral   Chew 80 mg by mouth every 8 (eight) hours.         . simvastatin (ZOCOR) 10 MG tablet   Oral   Take 10 mg by mouth at bedtime.         . sodium chloride 0.9 % SOLN 100 mL with imipenem-cilastatin 500 MG SOLR 500 mg   Intravenous   Inject 500 mg into the vein every 6 (six) hours.   6 each   0   . tiotropium (SPIRIVA) 18 MCG inhalation capsule   Inhalation   Place 18 mcg into inhaler and inhale daily.         . vitamin D, CHOLECALCIFEROL, 400 UNITS tablet   Oral   Take 400 Units by mouth daily.          BP 128/72  Pulse 90  Temp(Src) 99.7 F (37.6 C) (Oral)  Resp 14  Ht 5\' 4"  (1.626 m)  Wt 290 lb (131.543 kg)  BMI 49.75 kg/m2  SpO2 97% Physical Exam  Nursing note and vitals reviewed. Constitutional: She is oriented to person, place, and time. She appears well-developed and well-nourished.  Non-toxic appearance. No distress.  HENT:  Head: Normocephalic and atraumatic.  Eyes: Conjunctivae, EOM and lids are normal. Pupils are equal, round, and reactive to light.  Neck: Normal range of motion. Neck supple. No tracheal deviation present. No mass present.  Cardiovascular: Normal rate, regular rhythm and normal heart sounds.  Exam reveals no gallop.   No murmur heard. Pulmonary/Chest: Effort normal. No stridor. No respiratory distress. She has decreased breath sounds. She has wheezes. She has no rhonchi. She has no rales.  Abdominal: Soft.  Normal appearance and bowel sounds are normal. She exhibits no distension. There is no tenderness. There is no rebound and no CVA tenderness.  Musculoskeletal: Normal range of motion. She exhibits no edema and no tenderness.  Bilateral 2+ edema lower extremities  Neurological: She is alert and oriented to person, place, and time. No  cranial nerve deficit. GCS eye subscore is 4. GCS verbal subscore is 5. GCS motor subscore is 6.  Skin: Skin is warm and dry. No abrasion and no rash noted.  Psychiatric: She has a normal mood and affect. Her speech is normal and behavior is normal.    ED Course  Procedures (including critical care time) Labs Review Labs Reviewed  PRO B NATRIURETIC PEPTIDE  CBC WITH DIFFERENTIAL  COMPREHENSIVE METABOLIC PANEL   Imaging Review No results found.   EKG Interpretation   Date/Time:  Saturday September 07 2013 16:51:53 EST Ventricular Rate:  89 PR Interval:  160 QRS Duration: 75 QT Interval:  352 QTC Calculation: 428 R Axis:   33 Text Interpretation:  Sinus rhythm Confirmed by Freida BusmanALLEN  MD, Assad Harbeson ([id number])  on 09/07/2013 4:56:57 PM      MDM   Final diagnoses:  None   Patient had chest CT here to rule out pulmonary embolism which came back normal. She was given albuterol treatment along with Solu-Medrol for treatment of COPD. She is to be admitted for further treatment of her COPD.     Toy BakerAnthony T Lyncoln Maskell, MD 09/07/13 [phone]

## 2013-09-07 NOTE — ED Notes (Signed)
IV attempted x 2 by 2 different RN's. IV team called.

## 2013-09-08 DIAGNOSIS — J9620 Acute and chronic respiratory failure, unspecified whether with hypoxia or hypercapnia: Secondary | ICD-10-CM

## 2013-09-08 DIAGNOSIS — E662 Morbid (severe) obesity with alveolar hypoventilation: Secondary | ICD-10-CM | POA: Diagnosis present

## 2013-09-08 DIAGNOSIS — J9690 Respiratory failure, unspecified, unspecified whether with hypoxia or hypercapnia: Secondary | ICD-10-CM | POA: Diagnosis present

## 2013-09-08 DIAGNOSIS — E785 Hyperlipidemia, unspecified: Secondary | ICD-10-CM

## 2013-09-08 LAB — I-STAT ARTERIAL BLOOD GAS, ED
ACID-BASE EXCESS: 14 mmol/L — AB (ref 0.0–2.0)
Bicarbonate: 41.4 mEq/L — ABNORMAL HIGH (ref 20.0–24.0)
O2 Saturation: 78 %
TCO2: 43 mmol/L (ref 0–100)
pCO2 arterial: 67.6 mmHg (ref 35.0–45.0)
pH, Arterial: 7.395 (ref 7.350–7.450)
pO2, Arterial: 45 mmHg — ABNORMAL LOW (ref 80.0–100.0)

## 2013-09-08 LAB — GLUCOSE, CAPILLARY
GLUCOSE-CAPILLARY: 138 mg/dL — AB (ref 70–99)
GLUCOSE-CAPILLARY: 152 mg/dL — AB (ref 70–99)
Glucose-Capillary: 143 mg/dL — ABNORMAL HIGH (ref 70–99)
Glucose-Capillary: 129 mg/dL — ABNORMAL HIGH (ref 70–99)
Glucose-Capillary: 128 mg/dL — ABNORMAL HIGH (ref 70–99)

## 2013-09-08 LAB — POCT I-STAT 3, ART BLOOD GAS (G3+)
Acid-Base Excess: 15 mmol/L — ABNORMAL HIGH (ref 0.0–2.0)
Bicarbonate: 44.9 mEq/L — ABNORMAL HIGH (ref 20.0–24.0)
O2 Saturation: 92 %
PO2 ART: 75 mmHg — AB (ref 80.0–100.0)
TCO2: 48 mmol/L (ref 0–100)
pCO2 arterial: 93.7 mmHg (ref 35.0–45.0)
pH, Arterial: 7.289 — ABNORMAL LOW (ref 7.350–7.450)

## 2013-09-08 LAB — CBC
HCT: 33.2 % — ABNORMAL LOW (ref 36.0–46.0)
Hemoglobin: 10.3 g/dL — ABNORMAL LOW (ref 12.0–15.0)
MCH: 27.1 pg (ref 26.0–34.0)
MCHC: 31 g/dL (ref 30.0–36.0)
MCV: 87.4 fL (ref 78.0–100.0)
Platelets: 195 10*3/uL (ref 150–400)
RBC: 3.8 MIL/uL — ABNORMAL LOW (ref 3.87–5.11)
RDW: 15.3 % (ref 11.5–15.5)
WBC: 8.5 10*3/uL (ref 4.0–10.5)

## 2013-09-08 LAB — URINALYSIS, ROUTINE W REFLEX MICROSCOPIC
Bilirubin Urine: NEGATIVE
GLUCOSE, UA: NEGATIVE mg/dL
HGB URINE DIPSTICK: NEGATIVE
Ketones, ur: NEGATIVE mg/dL
LEUKOCYTES UA: NEGATIVE
Nitrite: NEGATIVE
PROTEIN: NEGATIVE mg/dL
Specific Gravity, Urine: 1.037 — ABNORMAL HIGH (ref 1.005–1.030)
UROBILINOGEN UA: 0.2 mg/dL (ref 0.0–1.0)
pH: 5.5 (ref 5.0–8.0)

## 2013-09-08 LAB — CREATININE, SERUM
Creatinine, Ser: 0.67 mg/dL (ref 0.50–1.10)
GFR calc Af Amer: >90 mL/min (ref >90)
GFR calc non Af Amer: >90 mL/min (ref >90)

## 2013-09-08 LAB — MRSA PCR SCREENING: MRSA by PCR: NEGATIVE

## 2013-09-08 MED ORDER — BUDESONIDE 0.5 MG/2ML IN SUSP
0.5000 mg | Freq: Two times a day (BID) | RESPIRATORY_TRACT | Status: DC
  Administered 2013-09-08 – 2013-09-24 (×32): 0.5 mg via RESPIRATORY_TRACT
  Filled 2013-09-08: qty 4
  Filled 2013-09-08 (×12): qty 2
  Filled 2013-09-08: qty 4
  Filled 2013-09-08 (×19): qty 2
  Filled 2013-09-08: qty 4
  Filled 2013-09-08: qty 2

## 2013-09-08 MED ORDER — SODIUM CHLORIDE 0.9 % IV SOLN: 250.0000 mL | INTRAVENOUS | Status: DC | PRN

## 2013-09-08 MED ORDER — HEPARIN SODIUM (PORCINE) 5000 UNIT/ML IJ SOLN
5000.0000 [IU] | Freq: Three times a day (TID) | INTRAMUSCULAR | Status: DC
  Administered 2013-09-08 – 2013-09-24 (×49): 5000 [IU] via SUBCUTANEOUS
  Filled 2013-09-08 (×52): qty 1

## 2013-09-08 MED ORDER — CHLORHEXIDINE GLUCONATE 0.12 % MT SOLN
15.0000 mL | Freq: Two times a day (BID) | OROMUCOSAL | Status: DC
  Administered 2013-09-08 – 2013-09-23 (×31): 15 mL via OROMUCOSAL
  Filled 2013-09-08 (×34): qty 15

## 2013-09-08 MED ORDER — BIOTENE DRY MOUTH MT LIQD
15.0000 mL | Freq: Four times a day (QID) | OROMUCOSAL | Status: DC
  Administered 2013-09-09 – 2013-09-24 (×54): 15 mL via OROMUCOSAL

## 2013-09-08 MED ORDER — INSULIN ASPART 100 UNIT/ML ~~LOC~~ SOLN
1.0000 [IU] | SUBCUTANEOUS | Status: DC
  Administered 2013-09-08 (×2): 1 [IU] via SUBCUTANEOUS

## 2013-09-08 MED ORDER — INSULIN ASPART 100 UNIT/ML ~~LOC~~ SOLN
0.0000 [IU] | SUBCUTANEOUS | Status: DC
  Administered 2013-09-08: 2 [IU] via SUBCUTANEOUS
  Administered 2013-09-08: 3 [IU] via SUBCUTANEOUS
  Administered 2013-09-08: 1 [IU] via SUBCUTANEOUS
  Administered 2013-09-09 – 2013-09-13 (×3): 2 [IU] via SUBCUTANEOUS
  Administered 2013-09-14: 3 [IU] via SUBCUTANEOUS
  Administered 2013-09-15: 2 [IU] via SUBCUTANEOUS

## 2013-09-08 NOTE — H&P (Addendum)
PULMONARY  / CRITICAL CARE MEDICINE HISTORY AND PHYSICAL EXAMINATION  Name: [name] MRN: [id number] DOB: 06-Oct-1950    ADMISSION DATE:  09/07/2013  CHIEF COMPLAINT:  Shortness of breath.  BRIEF PATIENT DESCRIPTION: 63 year old female with chronic respiratory failure requiring nighttime mechanical ventilation via tracheostomy who presents with hypercarbic respiratory failure likely secondary to inadequately treated obesity hypoventilation syndrome.  SIGNIFICANT EVENTS / STUDIES:  1. ABG on arrival 09/07/2013 7.287/96.8/76.0/46.2 2. ABG after approximately one hour on pressure support ventilation 7.395/67.6./45.0 (venous by sats)/41.4 3. CTAngio on Admission 09/07/2013 no obvious pulmonary embolism, atelectasis, and no acute consolidation.  LINES / TUBES: 1. PIV  CULTURES: 1. Resp Viral Panel 2. Sputum Culture  ANTIBIOTICS: 1. None  HISTORY OF PRESENT ILLNESS:  [name] is a 63 year old female with a chart diagnosis of COPD even though she never smoked, chronic respiratory failure, OSA/OHS and a recent admission for Pseudomonas pneumonia who was transferred from her skilled nursing facility today for hypoxemia. The shortness of breath has been building for a little while. There is no associated cough, fever, chills, wheezing, nausea, vomiting, or diarrhea. She has had a sore throat recently. Of note, she feels a short of breath as she did when she arrived with the recent bout of Pseudomonas pneumonia. She sleeps on the ventilator at night; she does not currently know her pressure settings.  PAST MEDICAL HISTORY :  Past Medical History  Diagnosis Date  . COPD   . Hypertension   . Chronic respiratory failure     3 L oxygen  . GERD (gastroesophageal reflux disease)   . Diabetes mellitus   . Obesity, Class III, BMI 40-49.9 (morbid obesity)   . Obstructive sleep apnea on CPAP   . Peripheral vascular disease   . Bipolar disease, chronic   . Lymphedema   . COPD (chronic  obstructive pulmonary disease)   . Cellulitis   . Coronary artery disease   . Asthma   . Myocardial infarction   . Vertigo     Past Surgical History  Procedure Laterality Date  . Abdominal hysterectomy    . Cholecystectomy    . Appendectomy    . Tubal ligation    . Tracheostomy      Prior to Admission medications   Medication Sig Start Date End Date Taking? Authorizing Provider  albuterol (PROVENTIL) (2.5 MG/3ML) 0.083% nebulizer solution Take 2.5 mg by nebulization every 6 (six) hours as needed for wheezing or shortness of breath.   Yes Historical Provider, MD  amLODipine (NORVASC) 10 MG tablet Take 10 mg by mouth daily.   Yes Historical Provider, MD  arformoterol (BROVANA) 15 MCG/2ML NEBU Take 15 mcg by nebulization 2 (two) times daily.   Yes Historical Provider, MD  ARIPiprazole (ABILIFY) 10 MG tablet Take 10 mg by mouth daily. For depressive psychosis   Yes Historical Provider, MD  ascorbic acid (VITAMIN C) 500 MG tablet Take 500 mg by mouth 2 (two) times daily.   Yes Historical Provider, MD  benzonatate (TESSALON) 200 MG capsule Take 200 mg by mouth 3 (three) times daily as needed for cough.   Yes Historical Provider, MD  budesonide-formoterol (SYMBICORT) 160-4.5 MCG/ACT inhaler Inhale 2 puffs into the lungs 2 (two) times daily.   Yes Historical Provider, MD  busPIRone (BUSPAR) 10 MG tablet Take 10 mg by mouth 2 (two) times daily.   Yes Historical Provider, MD  cloNIDine (CATAPRES) 0.2 MG tablet Take 0.2 mg by mouth 2 (two) times daily.   Yes Historical Provider,  MD  divalproex (DEPAKOTE ER) 250 MG 24 hr tablet Take 250 mg by mouth 2 (two) times daily.   Yes Historical Provider, MD  DULoxetine (CYMBALTA) 60 MG capsule Take 60 mg by mouth 2 (two) times daily.   Yes Historical Provider, MD  famotidine (PEPCID) 20 MG tablet Take 20 mg by mouth daily.    Yes Historical Provider, MD  fluticasone (FLONASE) 50 MCG/ACT nasal spray Place 2 sprays into the nose daily.   Yes Historical  Provider, MD  isosorbide mononitrate (IMDUR) 30 MG 24 hr tablet Take 30 mg by mouth daily.   Yes Historical Provider, MD  LORazepam (ATIVAN) 0.5 MG tablet Take 0.5 mg by mouth every 12 (twelve) hours as needed for anxiety.   Yes Historical Provider, MD  nitroGLYCERIN (NITROSTAT) 0.4 MG SL tablet Place 0.4 mg under the tongue every 5 (five) minutes as needed for chest pain.    Yes Historical Provider, MD  senna (SENOKOT) 8.6 MG TABS Take 2 tablets by mouth daily.   Yes Historical Provider, MD  simethicone (MYLICON) 80 MG chewable tablet Chew 80 mg by mouth every 8 (eight) hours.   Yes Historical Provider, MD  simvastatin (ZOCOR) 10 MG tablet Take 10 mg by mouth at bedtime.   Yes Historical Provider, MD  tiotropium (SPIRIVA) 18 MCG inhalation capsule Place 18 mcg into inhaler and inhale daily.   Yes Historical Provider, MD  vitamin D, CHOLECALCIFEROL, 400 UNITS tablet Take 400 Units by mouth daily.   Yes Historical Provider, MD    Allergies  Allergen Reactions  . Penicillins Anaphylaxis    Tongue swelling  . Ace Inhibitors Other (See Comments)    Per MAR  . Aspirin Other (See Comments)    Per MAR  . Bee Venom Other (See Comments)    Unknown  . Lisinopril     Per MAR  . Nyquil Cold & [Dm-Doxylamine-Acetaminophen] Other (See Comments)    Unknown   . Ppd [Tuberculin Purified Protein Derivative] Other (See Comments)    unknown    FAMILY HISTORY:  Family History  Problem Relation Age of Onset  . Prostate cancer      SOCIAL HISTORY:  reports that she has never smoked. She has never used smokeless tobacco. She reports that she does not drink alcohol or use illicit drugs.  REVIEW OF SYSTEMS:  A 12-system ROS was conducted and, unless otherwise specified in the HPI, was negative.   PHYSICAL EXAM  VITAL SIGNS: Temp:  [99.2 F (37.3 C)-99.7 F (37.6 C)] 99.2 F (37.3 C) (03/07 1620) Pulse Rate:  [79-90] 88 (03/08 0045) Resp:  [14-32] 21 (03/08 0045) BP: (108-137)/(35-78) 123/62  mmHg (03/08 0045) SpO2:  [93 %-100 %] 100 % (03/08 0045) FiO2 (%):  [40 %] 40 % (03/08 0040) Weight:  [290 lb (131.543 kg)] 290 lb (131.543 kg) (03/07 1440)  HEMODYNAMICS:    VENTILATOR SETTINGS: Vent Mode:  [-] CPAP;PSV FiO2 (%):  [40 %] 40 % Set Rate:  [18 bmp] 18 bmp Vt Set:  [440 mL] 440 mL PEEP:  [5 cmH20-8 cmH20] 8 cmH20 Pressure Support:  [15 cmH20] 15 cmH20 Plateau Pressure:  [31 cmH20] 31 cmH20  INTAKE / OUTPUT: Intake/Output   None     PHYSICAL EXAMINATION: General:  Obese female in no acute distress. Neuro:  Intact. HEENT:  Sclera anicteric, conjunctiva pink. Mucous membranes moist, oropharynx clear. Neck:  Trachea supple in midline. Tracheostomy present. Cardiovascular:  Regular rate rhythm, normal S1-S2, no murmurs rubs or gallops. Lungs:  Clear to auscultation bilaterally; no wheezing or crackles. Abdomen:  Obese, nontender, nondistended, positive bowel sounds. Musculoskeletal:  No clubbing cyanosis or edema. Skin:  Chronic venous insufficiency changes of the lower extremities bilaterally.  LABS:  CBC Recent Labs     09/07/13  1608  WBC  7.6  HGB  10.2*  HCT  32.9*  PLT  124*    Coag's No results found for this basename: APTT, INR,  in the last 72 hours  BMET Recent Labs     09/07/13  1608  NA  143  K  4.8  CL  95*  CO2  38*  BUN  12  CREATININE  0.70  GLUCOSE  123*    Electrolytes Recent Labs     09/07/13  1608  CALCIUM  9.4    Sepsis Markers No results found for this basename: LACTICACIDVEN, PROCALCITON, O2SATVEN,  in the last 72 hours  ABG Recent Labs     09/07/13  2333  09/08/13  0120  PHART  7.287*  7.395  PCO2ART  96.8*  67.6*  PO2ART  76.0*  45.0*    Liver Enzymes Recent Labs     09/07/13  1608  AST  24  ALT  11  ALKPHOS  61  BILITOT  <0.2*  ALBUMIN  3.1*    Cardiac Enzymes No results found for this basename: TROPONINI, PROBNP,  in the last 72 hours  Glucose No results found for this basename: GLUCAP,   in the last 72 hours  Imaging Ct Angio Chest Pe W/cm &/or Wo Cm  09/07/2013   CLINICAL DATA:  Chest pain and shortness of breath  EXAM: CT ANGIOGRAPHY CHEST WITH CONTRAST  TECHNIQUE: Multidetector CT imaging of the chest was performed using the standard protocol during bolus administration of intravenous contrast. Multiplanar CT image reconstructions and MIPs were obtained to evaluate the vascular anatomy.  CONTRAST:  07/06/2013  COMPARISON:  None.  FINDINGS: THORACIC INLET/BODY WALL:  Visible portions of a tracheostomy are normally seated. There is persisting left axillary in deep pectoral lymphadenopathy. The nodes appear stable in size, with an axillary node measuring 28 x 16 mm  MEDIASTINUM:  Cardiomegaly. No pericardial effusion. No mediastinal adenopathy. Negative for aortic dissection or aneurysm. There is no definitive pulmonary embolism. Evaluation in the right upper lobe is difficult due to unpredictable streak artifact from dense contrast within the SVC. The areas of pulmonary arterial decrease in attenuation do not have the typical shape for an acute thrombus within the center of the vessel. Additionally, the neighboring pulmonary veins have similar alternating density.  LUNG WINDOWS:  Bibasilar atelectasis, worse on the right secondary to chronically elevated diaphragm. No evidence of edema or consolidation. No effusion or pneumothorax.  UPPER ABDOMEN:  No acute findings.  OSSEOUS:  No acute fracture.  No suspicious lytic or blastic lesions.  Review of the MIP images confirms the above findings.  IMPRESSION: 1. No definitive pulmonary embolism. 2. Chronic hypoaeration with bibasilar atelectasis. 3. Chronic left axillary and deep pectoral adenopathy without interval change.   Electronically Signed   By: Tiburcio Pea M.D.   On: 09/07/2013 20:34   Dg Chest Port 1 View  09/07/2013   CLINICAL DATA:  Chest pain.  EXAM: PORTABLE CHEST - 1 VIEW  COMPARISON:  08/22/2013  FINDINGS: Tracheostomy tube is  4.2 cm above the carina. Low lung volumes. Prominent lung markings and prominent heart size are likely related to the low lung volumes. No focal airspace disease.  IMPRESSION: Low  lung volumes without focal disease.   Electronically Signed   By: Richarda Overlie M.D.   On: 09/07/2013 15:52    EKG: Personally reviewed. (-) Acute ST changes.  ASSESSMENT / PLAN: Principal Problem:   Respiratory failure, acute and chronic Active Problems:   OBSTRUCTIVE SLEEP APNEA   Obesity hypoventilation syndrome   Morbid obesity   Tracheostomy in place   BIPOLAR DISORDER UNSPECIFIED   Essential hypertension, benign   Anemia of other chronic disease   Type II or unspecified type diabetes mellitus with neurological manifestations, not stated as uncontrolled(250.60)   Hyperlipidemia   Respiratory failure   PULMONARY A/P: 1. Acute on chronic respiratory failure: I do not believe that a COPD exacerbation explains to [name]'s current presentation. [name] is a lifelong nonsmoker. Is possible that she has some component of asthma, however, I believe a significant portion of her breathing difficulties stem from her obesity. I think a majority of her current presentation is secondary to inadequately treated obesity hypoventilation syndrome. 2. Obesity hypoventilation/OSA:  Vent rest overnight; consider attempting to wean in the morning, however, patient may require long-term ventilator support  CARDIOVASCULAR A/P:  1. HTN: 2. HL:  Cont OP med regimen    RENAL A/P:  1. No acute Issue  GASTROINTESTINAL A/P:  1. No acute issue  HEMATOLOGIC A/P:   1. AOCD: Stable  INFECTIOUS A/P: 1. No acute issue  Check viral panel and sputum culture  ENDOCRINE A/P: 1. DM:   Hyperglycemia management algorhythem  NEUROLOGIC A/P: 1. Bipolar DO  Cont OP med regimen  BEST PRACTICE / DISPOSITION Level of Care:  ICU Consultants:  None Code Status:  Full Diet:  Heart Healthy DVT Px:  SQH GI  Px:  None indicated Skin Integrity:  Intact Social / Family:  Updated at bedside  TODAY'S SUMMARY:   I have personally obtained a history, examined the patient, evaluated laboratory and imaging results, formulated the assessment and plan and placed orders.  CRITICAL CARE: The patient is critically ill with multiple organ systems failure and requires high complexity decision making for assessment and support, frequent evaluation and titration of therapies, application of advanced monitoring technologies and extensive interpretation of multiple databases. Critical Care Time devoted to patient care services described in this note is 45 minutes.   Evalyn Casco, MD Pulmonary and Critical Care Medicine [facility] Pager: [phone]  09/08/2013, 1:51 AM

## 2013-09-08 NOTE — Progress Notes (Signed)
PULMONARY  / CRITICAL CARE MEDICINE HISTORY AND PHYSICAL EXAMINATION  Name: [name] MRN: [id number] DOB: August 18, 1950    ADMISSION DATE:  09/07/2013  CHIEF COMPLAINT:  Shortness of breath.  BRIEF PATIENT DESCRIPTION: 63 year old female with OSA/OHS, chronic respiratory failure requiring nighttime mechanical ventilation via tracheostomy admitted 3/7 with hypercarbic respiratory failure likely secondary to inadequately treated obesity hypoventilation syndrome +/- URI.   SIGNIFICANT EVENTS / STUDIES:  3/7 CTA chest>>> no obvious pulmonary embolism, atelectasis, and no acute consolidation.  LINES / TUBES: Chronic trach>>>  CULTURES: Resp Viral Panel 3/8>>> Sputum Culture 3/8>>>  ANTIBIOTICS: None   Subjective/Overnight:     PHYSICAL EXAM  VITAL SIGNS: Temp:  [98 F (36.7 C)-99.7 F (37.6 C)] 98.2 F (36.8 C) (03/08 0725) Pulse Rate:  [68-91] 70 (03/08 0800) Resp:  [14-32] 18 (03/08 0800) BP: (95-137)/(35-78) 95/53 mmHg (03/08 0800) SpO2:  [93 %-100 %] 99 % (03/08 0800) FiO2 (%):  [40 %] 40 % (03/08 0800) Weight:  [288 lb 5.8 oz (130.8 kg)-290 lb (131.543 kg)] 288 lb 5.8 oz (130.8 kg) (03/08 0453)  HEMODYNAMICS:    VENTILATOR SETTINGS: Vent Mode:  [-] PRVC FiO2 (%):  [40 %] 40 % Set Rate:  [18 bmp-20 bmp] 20 bmp Vt Set:  [440 mL-500 mL] 500 mL PEEP:  [5 cmH20-8 cmH20] 5 cmH20 Pressure Support:  [15 cmH20] 15 cmH20 Plateau Pressure:  [24 cmH20-31 cmH20] 24 cmH20  INTAKE / OUTPUT: Intake/Output     03/07 0701 - 03/08 0700 03/08 0701 - 03/09 0700   I.V. (mL/kg) 149 (1.1) 40 (0.3)   Total Intake(mL/kg) 149 (1.1) 40 (0.3)   Urine (mL/kg/hr) 205    Total Output 205     Net -56 +40          PHYSICAL EXAMINATION: General:  Obese female in no acute distress. Neuro:  Intact, MAE  HEENT:  Sclera anicteric, conjunctiva pink. Mucous membranes moist, oropharynx clear. Neck:  Trachea supple in midline.  #6 cuffed shiley, c/d  Cardiovascular:  Regular rate  rhythm, normal S1-S2, no murmurs rubs or gallops. Lungs:  Resps even non labored on PRVC, Clear to auscultation bilaterally; no wheezing or crackles. Abdomen:  Obese, nontender, nondistended, positive bowel sounds. Musculoskeletal:  No clubbing cyanosis or edema. Skin:  Chronic venous insufficiency changes of the lower extremities bilaterally.  LABS:  CBC Recent Labs     09/07/13  1608  09/08/13  0535  WBC  7.6  8.5  HGB  10.2*  10.3*  HCT  32.9*  33.2*  PLT  124*  195    Coag's No results found for this basename: APTT, INR,  in the last 72 hours  BMET Recent Labs     09/07/13  1608  09/08/13  0535  NA  143   --   K  4.8   --   CL  95*   --   CO2  38*   --   BUN  12   --   CREATININE  0.70  0.67  GLUCOSE  123*   --     Electrolytes Recent Labs     09/07/13  1608  CALCIUM  9.4    Sepsis Markers No results found for this basename: LACTICACIDVEN, PROCALCITON, O2SATVEN,  in the last 72 hours  ABG Recent Labs     09/07/13  2333  09/08/13  0120  09/08/13  0718  PHART  7.287*  7.395  7.289*  PCO2ART  96.8*  67.6*  93.7*  PO2ART  76.0*  45.0*  75.0*    Liver Enzymes Recent Labs     09/07/13  1608  AST  24  ALT  11  ALKPHOS  61  BILITOT  <0.2*  ALBUMIN  3.1*    Cardiac Enzymes No results found for this basename: TROPONINI, PROBNP,  in the last 72 hours  Glucose Recent Labs     09/08/13  0327  09/08/13  0709  GLUCAP  143*  138*    Imaging Ct Angio Chest Pe W/cm &/or Wo Cm  09/07/2013   CLINICAL DATA:  Chest pain and shortness of breath  EXAM: CT ANGIOGRAPHY CHEST WITH CONTRAST  TECHNIQUE: Multidetector CT imaging of the chest was performed using the standard protocol during bolus administration of intravenous contrast. Multiplanar CT image reconstructions and MIPs were obtained to evaluate the vascular anatomy.  CONTRAST:  07/06/2013  COMPARISON:  None.  FINDINGS: THORACIC INLET/BODY WALL:  Visible portions of a tracheostomy are normally seated.  There is persisting left axillary in deep pectoral lymphadenopathy. The nodes appear stable in size, with an axillary node measuring 28 x 16 mm  MEDIASTINUM:  Cardiomegaly. No pericardial effusion. No mediastinal adenopathy. Negative for aortic dissection or aneurysm. There is no definitive pulmonary embolism. Evaluation in the right upper lobe is difficult due to unpredictable streak artifact from dense contrast within the SVC. The areas of pulmonary arterial decrease in attenuation do not have the typical shape for an acute thrombus within the center of the vessel. Additionally, the neighboring pulmonary veins have similar alternating density.  LUNG WINDOWS:  Bibasilar atelectasis, worse on the right secondary to chronically elevated diaphragm. No evidence of edema or consolidation. No effusion or pneumothorax.  UPPER ABDOMEN:  No acute findings.  OSSEOUS:  No acute fracture.  No suspicious lytic or blastic lesions.  Review of the MIP images confirms the above findings.  IMPRESSION: 1. No definitive pulmonary embolism. 2. Chronic hypoaeration with bibasilar atelectasis. 3. Chronic left axillary and deep pectoral adenopathy without interval change.   Electronically Signed   By: Tiburcio PeaJonathan  Watts M.D.   On: 09/07/2013 20:34   Dg Chest Port 1 View  09/07/2013   CLINICAL DATA:  Chest pain.  EXAM: PORTABLE CHEST - 1 VIEW  COMPARISON:  08/22/2013  FINDINGS: Tracheostomy tube is 4.2 cm above the carina. Low lung volumes. Prominent lung markings and prominent heart size are likely related to the low lung volumes. No focal airspace disease.  IMPRESSION: Low lung volumes without focal disease.   Electronically Signed   By: Richarda OverlieAdam  Henn M.D.   On: 09/07/2013 15:52     ASSESSMENT / PLAN: Principal Problem:   Respiratory failure, acute and chronic Active Problems:   Morbid obesity   BIPOLAR DISORDER UNSPECIFIED   OBSTRUCTIVE SLEEP APNEA   Essential hypertension, benign   Anemia of other chronic disease   Type II or  unspecified type diabetes mellitus with neurological manifestations, not stated as uncontrolled(250.60)   Tracheostomy in place   Obesity hypoventilation syndrome   Hyperlipidemia   Respiratory failure   PULMONARY Acute on chronic hypercarbic respiratory failure  OSA/OHS  Has hx "COPD" listed but highly doubt P:  Continue vent support for now, trial ATC later today  qhs vent - likely needs more vent support than just qhs F/u CXR  Hold further ABG unless mental status change   CARDIOVASCULAR HTN  P:  Cont home rx   RENAL A/P:  No acute Issue  GASTROINTESTINAL No active issue  P:  NPO  while on vent    HEMATOLOGIC Anemia of chronic disease  P:  F/u cbc   INFECTIOUS ?viral URI with cough, sore throat, SOB prior to admit P: Check viral panel and sputum culture Hold abx for now   ENDOCRINE DM P:  SSI   NEUROLOGIC Bipolar  P:  Cont OP med regimen    CRITICAL CARE: The patient is critically ill with multiple organ systems failure and requires high complexity decision making for assessment and support, frequent evaluation and titration of therapies, application of advanced monitoring technologies and extensive interpretation of multiple databases. Critical Care Time devoted to patient care services described in this note is 30 minutes.    Danford Bad, NP 09/08/2013  9:18 AM Pager: [phone] or [phone]  *Care during the described time interval was provided by me and/or other providers on the critical care team. I have reviewed this patient's available data, including medical history, events of note, physical examination and test results as part of my evaluation.    PCCM ATTENDING: I have interviewed and examined the patient and reviewed the database. I have formulated the assessment and plan as reflected in the note above with amendments made by me. 30 mins of direct critical care time provided  Looks very comfortable on vent. Will change  vent to PRN during day and and mandatory @ night   Billy Fischer, MD;  PCCM service; Mobile [phone]

## 2013-09-08 NOTE — Progress Notes (Signed)
09/08/13 2010  Pt experiencing chest pain 10/10. EKG done. 1 nitro tab given. Pt states chest pain decreased down to 3/10 post nitro tab. eLink MD Z and RN made aware.   Elisha HeadlandBrian Mack RN

## 2013-09-09 ENCOUNTER — Inpatient Hospital Stay (HOSPITAL_COMMUNITY): Payer: Medicaid Other

## 2013-09-09 DIAGNOSIS — F319 Bipolar disorder, unspecified: Secondary | ICD-10-CM

## 2013-09-09 DIAGNOSIS — Z930 Tracheostomy status: Secondary | ICD-10-CM

## 2013-09-09 DIAGNOSIS — J9600 Acute respiratory failure, unspecified whether with hypoxia or hypercapnia: Secondary | ICD-10-CM

## 2013-09-09 DIAGNOSIS — E662 Morbid (severe) obesity with alveolar hypoventilation: Secondary | ICD-10-CM

## 2013-09-09 LAB — CBC
HEMATOCRIT: 32.7 % — AB (ref 36.0–46.0)
Hemoglobin: 10.4 g/dL — ABNORMAL LOW (ref 12.0–15.0)
MCH: 26.9 pg (ref 26.0–34.0)
MCHC: 31.8 g/dL (ref 30.0–36.0)
MCV: 84.7 fL (ref 78.0–100.0)
PLATELETS: 203 10*3/uL (ref 150–400)
RBC: 3.86 MIL/uL — AB (ref 3.87–5.11)
RDW: 15.2 % (ref 11.5–15.5)
WBC: 7 10*3/uL (ref 4.0–10.5)

## 2013-09-09 LAB — GLUCOSE, CAPILLARY
GLUCOSE-CAPILLARY: 108 mg/dL — AB (ref 70–99)
Glucose-Capillary: 118 mg/dL — ABNORMAL HIGH (ref 70–99)
Glucose-Capillary: 118 mg/dL — ABNORMAL HIGH (ref 70–99)
Glucose-Capillary: 122 mg/dL — ABNORMAL HIGH (ref 70–99)
Glucose-Capillary: 126 mg/dL — ABNORMAL HIGH (ref 70–99)
Glucose-Capillary: 94 mg/dL (ref 70–99)

## 2013-09-09 NOTE — Progress Notes (Signed)
Pt came from ICU medical. Gave pt a CHG bath upon arrival. Pt has a trach and is on trach collar. Pt is on a specialty mattress. Pt is alert and oriented to place, self and situation. Pt urinated once on bedpan after coming to floor. Pt has a trach at bedside in case of emergency. Suction at bedside. Called and notified central telemetry.

## 2013-09-09 NOTE — Evaluation (Signed)
[facility] Speaking Valve - Evaluation Patient Details  Name: [name] MRN: [id number] Date of Birth: 04/21/1951  Today's Date: 09/09/2013 Time: [id number] SLP Time Calculation (min): 17 min  Past Medical History:  Past Medical History  Diagnosis Date  . COPD   . Hypertension   . Chronic respiratory failure     3 L oxygen  . GERD (gastroesophageal reflux disease)   . Diabetes mellitus   . Obesity, Class III, BMI 40-49.9 (morbid obesity)   . Obstructive sleep apnea on CPAP   . Peripheral vascular disease   . Bipolar disease, chronic   . Lymphedema   . COPD (chronic obstructive pulmonary disease)   . Cellulitis   . Coronary artery disease   . Asthma   . Myocardial infarction   . Vertigo    Past Surgical History:  Past Surgical History  Procedure Laterality Date  . Abdominal hysterectomy    . Cholecystectomy    . Appendectomy    . Tubal ligation    . Tracheostomy     HPI:  63 year old female with OSA/OHS, chronic respiratory failure requiring nighttime mechanical ventilation via tracheostomy admitted 3/7 with hypercarbic respiratory failure likely secondary to inadequately treated obesity hypoventilation syndrome +/- URI.  Pt has been seen by SLP services during prior admissions.  Has had success with PMSV in the past.   Has had swallow assessments in past with equal success and continues on PO diet.  Assessment / Plan / Recommendation Clinical Impression  Pt presents with excellent toleration of PMSV.  Has chronic trach/ nocturnal vent.  (#6 Shiley with cuff) Cuff partially deflated upon entering room; clinician fully deflated cuff.  Pt states she is unable to deflate/inflate cuff independently.  She placed valve, achieving normal quality phonation.  No symptoms of air trapping.  Sp02, HR, and RR stable without fluctuations.  Pt demonstrated removal of valve should she have difficulty breathing.  Recommend use of valve during all waking hours.  Pt in agreement.      SLP Assessment  Patient needs continued Speech Lanaguage Pathology Services(for education re: cleaning valve and cuff inflation/deflation - will f/u x 1 for education.   Follow Up Recommendations  None    Frequency and Duration min 1 x/week  1 week       SLP Goals Potential to Achieve Goals: Good   PMSV Trial  PMSV was placed for: 15 min Able to redirect subglottic air through upper airway: Yes Able to Attain Phonation: Yes Voice Quality: Normal Able to Expectorate Secretions: No attempts Breath Support for Phonation: Adequate Intelligibility: Intelligible Respirations During Trial: 18 SpO2 During Trial: 95 % Pulse During Trial: 82 Behavior: Alert;Controlled;Cooperative;Expresses self well   Tracheostomy Tube    #6 Shiley   Vent Dependency  FiO2 (%): 28 %    Cuff Deflation Trial Tolerated Cuff Deflation: Yes Length of Time for Cuff Deflation Trial: 15 min Behavior: Alert   Blenda MountsCouture, Aspin Palomarez Laurice 09/09/2013, 11:44 AM

## 2013-09-09 NOTE — Progress Notes (Addendum)
PULMONARY  / CRITICAL CARE MEDICINE HISTORY AND PHYSICAL EXAMINATION  Name: [name] MRN: [id number] DOB: 04-12-1951    ADMISSION DATE:  09/07/2013  CHIEF COMPLAINT:  Shortness of breath.  BRIEF PATIENT DESCRIPTION: 63 year old female with OSA/OHS, chronic respiratory failure requiring nighttime mechanical ventilation via tracheostomy admitted 3/7 with hypercarbic respiratory failure likely secondary to inadequately treated obesity hypoventilation syndrome +/- URI.    has a past medical history of COPD; Hypertension; Chronic respiratory failure; GERD (gastroesophageal reflux disease); Diabetes mellitus; Obesity, Class III, BMI 40-49.9 (morbid obesity); Obstructive sleep apnea on CPAP; Peripheral vascular disease; Bipolar disease, chronic; Lymphedema; COPD (chronic obstructive pulmonary disease); Cellulitis; Coronary artery disease; Asthma; Myocardial infarction; and Vertigo.  Marland Kitchen  LINES / TUBES: Chronic trach>>>  CULTURES: Resp Viral Panel 3/8>>> Sputum Culture 3/8>>>  ANTIBIOTICS: None   SIGNIFICANT EVENTS / STUDIES:  3/7 CTA chest>>> no obvious pulmonary embolism, atelectasis, and no acute consolidation  Subjective/Overnight:  09/09/13:  ATC day time. Nocturnal vent. No issues. Sitting in chair. Watching TV. RN thinks meets indication for SDU  PHYSICAL EXAM  VITAL SIGNS: Temp:  [98 F (36.7 C)-99.1 F (37.3 C)] 98.3 F (36.8 C) (03/09 0802) Pulse Rate:  [33-99] 33 (03/09 0600) Resp:  [12-30] 16 (03/09 0600) BP: (69-140)/(28-89) 75/40 mmHg (03/09 0600) SpO2:  [92 %-100 %] 100 % (03/09 0830) FiO2 (%):  [28 %-40 %] 28 % (03/09 0830) Weight:  [129.4 kg (285 lb 4.4 oz)] 129.4 kg (285 lb 4.4 oz) (03/09 0500)  HEMODYNAMICS:    VENTILATOR SETTINGS: Vent Mode:  [-] PRVC FiO2 (%):  [28 %-40 %] 28 % Set Rate:  [12 bmp] 12 bmp Vt Set:  [500 mL] 500 mL PEEP:  [5 cmH20] 5 cmH20 Plateau Pressure:  [21 cmH20-29 cmH20] 21 cmH20  INTAKE / OUTPUT: Intake/Output     03/08  0701 - 03/09 0700 03/09 0701 - 03/10 0700   P.O. 1080    I.V. (mL/kg) 160 (1.2)    Total Intake(mL/kg) 1240 (9.6)    Urine (mL/kg/hr) 1575 (0.5)    Total Output 1575     Net -335          Stool Occurrence 1 x 2 x     PHYSICAL EXAMINATION: General:  Obese female in no acute distress. Neuro:  Intact, MAE  HEENT:  Sclera anicteric, conjunctiva pink. Mucous membranes moist, oropharynx clear. Neck:  Trachea supple in midline.  #6 cuffed shiley, c/d  Cardiovascular:  Regular rate rhythm, normal S1-S2, no murmurs rubs or gallops. Lungs:  Resps even non labored on PRVC, Clear to auscultation bilaterally; no wheezing or crackles. Abdomen:  Obese, nontender, nondistended, positive bowel sounds. Musculoskeletal:  No clubbing cyanosis or edema. Skin:  Chronic venous insufficiency changes of the lower extremities bilaterally.  LABS:  PULMONARY  Recent Labs Lab 09/07/13 2333 09/08/13 0120 09/08/13 0718  PHART 7.287* 7.395 7.289*  PCO2ART 96.8* 67.6* 93.7*  PO2ART 76.0* 45.0* 75.0*  HCO3 46.2* 41.4* 44.9*  TCO2 49 43 48  O2SAT 92.0 78.0 92.0    CBC  Recent Labs Lab 09/07/13 1608 09/08/13 0535  HGB 10.2* 10.3*  HCT 32.9* 33.2*  WBC 7.6 8.5  PLT 124* 195    COAGULATION No results found for this basename: INR,  in the last 168 hours  CARDIAC  No results found for this basename: TROPONINI,  in the last 168 hours No results found for this basename: PROBNP,  in the last 168 hours   CHEMISTRY  Recent Labs Lab 09/07/13 1608 09/08/13  0535  NA 143  --   K 4.8  --   CL 95*  --   CO2 38*  --   GLUCOSE 123*  --   BUN 12  --   CREATININE 0.70 0.67  CALCIUM 9.4  --    Estimated Creatinine Clearance: 94.9 ml/min (by C-G formula based on Cr of 0.67).   LIVER  Recent Labs Lab 09/07/13 1608  AST 24  ALT 11  ALKPHOS 61  BILITOT <0.2*  PROT 6.8  ALBUMIN 3.1*     INFECTIOUS  Recent Labs Lab 09/07/13 1626  LATICACIDVEN 1.55     ENDOCRINE CBG (last 3)    Recent Labs  09/08/13 2341 09/09/13 0358 09/09/13 0800  GLUCAP 122* 118* 108*         IMAGING x48h  Ct Angio Chest Pe W/cm &/or Wo Cm  09/07/2013   CLINICAL DATA:  Chest pain and shortness of breath  EXAM: CT ANGIOGRAPHY CHEST WITH CONTRAST  TECHNIQUE: Multidetector CT imaging of the chest was performed using the standard protocol during bolus administration of intravenous contrast. Multiplanar CT image reconstructions and MIPs were obtained to evaluate the vascular anatomy.  CONTRAST:  07/06/2013  COMPARISON:  None.  FINDINGS: THORACIC INLET/BODY WALL:  Visible portions of a tracheostomy are normally seated. There is persisting left axillary in deep pectoral lymphadenopathy. The nodes appear stable in size, with an axillary node measuring 28 x 16 mm  MEDIASTINUM:  Cardiomegaly. No pericardial effusion. No mediastinal adenopathy. Negative for aortic dissection or aneurysm. There is no definitive pulmonary embolism. Evaluation in the right upper lobe is difficult due to unpredictable streak artifact from dense contrast within the SVC. The areas of pulmonary arterial decrease in attenuation do not have the typical shape for an acute thrombus within the center of the vessel. Additionally, the neighboring pulmonary veins have similar alternating density.  LUNG WINDOWS:  Bibasilar atelectasis, worse on the right secondary to chronically elevated diaphragm. No evidence of edema or consolidation. No effusion or pneumothorax.  UPPER ABDOMEN:  No acute findings.  OSSEOUS:  No acute fracture.  No suspicious lytic or blastic lesions.  Review of the MIP images confirms the above findings.  IMPRESSION: 1. No definitive pulmonary embolism. 2. Chronic hypoaeration with bibasilar atelectasis. 3. Chronic left axillary and deep pectoral adenopathy without interval change.   Electronically Signed   By: Tiburcio Pea M.D.   On: 09/07/2013 20:34   Dg Chest Port 1 View  09/09/2013   CLINICAL DATA:  Respiratory  failure.  EXAM: PORTABLE CHEST - 1 VIEW  COMPARISON:  09/07/2013  FINDINGS: Tracheostomy tube in adequate position. Lungs are moderately hypoinflated with mild stable elevation of the right hemidiaphragm. No definite consolidation or effusion. Mild stable prominence of the cardiac silhouette. Remainder of the exam is unchanged.  IMPRESSION: Moderate hypoinflation without acute cardiopulmonary disease.  Stable mild cardiomegaly.   Electronically Signed   By: Elberta Fortis M.D.   On: 09/09/2013 07:52   Dg Chest Port 1 View  09/07/2013   CLINICAL DATA:  Chest pain.  EXAM: PORTABLE CHEST - 1 VIEW  COMPARISON:  08/22/2013  FINDINGS: Tracheostomy tube is 4.2 cm above the carina. Low lung volumes. Prominent lung markings and prominent heart size are likely related to the low lung volumes. No focal airspace disease.  IMPRESSION: Low lung volumes without focal disease.   Electronically Signed   By: Richarda Overlie M.D.   On: 09/07/2013 15:52       ASSESSMENT / PLAN:  Principal Problem:   Respiratory failure, acute and chronic Active Problems:   Morbid obesity   BIPOLAR DISORDER UNSPECIFIED   OBSTRUCTIVE SLEEP APNEA   Essential hypertension, benign   Anemia of other chronic disease   Type II or unspecified type diabetes mellitus with neurological manifestations, not stated as uncontrolled(250.60)   Tracheostomy in place   Obesity hypoventilation syndrome   Hyperlipidemia   Respiratory failure   PULMONARY Acute on chronic hypercarbic respiratory failure  OSA/OHS  Has hx "COPD" listed but highly doubt   - doing well P:  Continue QHS full support + day time  ATC , ? Can wean to QHS pressure support SLP PMV assessment  CARDIOVASCULAR HTN  P:  Cont home rx   RENAL A/P:  No acute Issue Dc foley  GASTROINTESTINAL No active issue  P:  Carb modified diet   HEMATOLOGIC Anemia of chronic disease  P:  F/u cbc   INFECTIOUS ?viral URI with cough, sore throat, SOB prior to admit  P: await  viral panel and sputum culture Hold abx for now   ENDOCRINE DM P:  SSI   NEUROLOGIC Bipolar   - stable P:  Cont OP med regimen  GLOBAL 09/09/13: MOVE TO SDU    Dr. Kalman ShanMurali Analynn Daum, M.D., [facility] Pulmonary and Critical Care Medicine Staff Physician [facility] Pager: [phone], If no answer or between  15:00h - 7:00h: call [phone]  09/09/2013 11:00 AM

## 2013-09-09 NOTE — Care Management Note (Signed)
    Page 1 of 2   09/27/2013     12:06:47 PM   CARE MANAGEMENT NOTE 09/27/2013  Patient:  [name],[name]   Account Number:  [id number]  Date Initiated:  09/09/2013  Documentation initiated by:  [facility],Ary Lavine  Subjective/Objective Assessment:   Admitted with hypercapnea resp failure. - placed on vent - chronic trach from SNF.     Action/Plan:   Anticipated DC Date:  09/13/2013   Anticipated DC Plan:  SKILLED NURSING FACILITY      DC Planning Services  CM consult      Choice offered to / List presented to:             Status of service:  Completed, signed off Medicare Important Message given?   (If response is "NO", the following Medicare IM given date fields will be blank) Date Medicare IM given:   Date Additional Medicare IM given:    Discharge Disposition:  SKILLED NURSING FACILITY  Per UR Regulation:  Reviewed for med. necessity/level of care/duration of stay  If discussed at Long Length of Stay Meetings, dates discussed:   09/12/2013  09/17/2013    Comments:  09/27/13 CM [facility], Santo HeldDebra Tucker concerning discharge needs.  Pt was discharged earlier this week to facility and will not be needing HH at this time.  Freddy JakschSarah Lacrystal Barbe, BSN, CM [phone].  ContactArelia Longest:  Brooks,Joan Daughter [phone]                 Orvis BrillFlorence,[name] Brother   [phone]  09/16/13 1046 Henrietta Mayo RN MSN BSN CCM Brother and dtr with multiple questions about Medicare eligibility, [facility], need for vent SNF and  requirements, rehab potential, etc.  Family plans to visit [facility] as they are averse to pt going out of state and feel they can talk to the adm coordianator there and persuade her to offer a bed.  They also request PT re-eval as they state they can see that pt's functional status has improved since her hospitalization.  Pt has no payor source except [employer] and brother plans to see if pt qualified [facility] even  though she has not worked.  09/13/13 1400 Henrietta Mayo RN MSN BSN CCM Talked with dtr, Aurea GraffJoan, after she received word [facility]  Offered her the option of taking pt home on nocturnal vent, explained requirements.  09/12/13 [id number] Verdis PrimeHenrietta Mayo RN MSN BSN CCM Pt has been on 28% TC x 24 hrs but pCO2 is 70.1.  Per PCCM, pt will need to transfer to vent SNF vs trach SNF.  CSW notified.  09/11/13 1528 Henrietta Mayo RN MSN BSN CCM [facility] per PCCM, trial of TC overnight vs nocturnal vent to determine if pt will need vent SNF.  09-09-13 3:10pm Avie ArenasSarah Brown, RNBSN [phone] Readmission with resp failure - on chronic trach collar - ?? need for vent support at night. Previously has not been on vent support at night.

## 2013-09-10 DIAGNOSIS — D638 Anemia in other chronic diseases classified elsewhere: Secondary | ICD-10-CM

## 2013-09-10 LAB — BASIC METABOLIC PANEL
BUN: 22 mg/dL (ref 6–23)
CALCIUM: 9.3 mg/dL (ref 8.4–10.5)
CO2: 38 meq/L — AB (ref 19–32)
Chloride: 96 mEq/L (ref 96–112)
Creatinine, Ser: 0.9 mg/dL (ref 0.50–1.10)
GFR calc Af Amer: 77 mL/min — ABNORMAL LOW (ref >90)
GFR calc non Af Amer: 66 mL/min — ABNORMAL LOW (ref >90)
GLUCOSE: 102 mg/dL — AB (ref 70–99)
Potassium: 4.3 mEq/L (ref 3.7–5.3)
SODIUM: 145 meq/L (ref 137–147)

## 2013-09-10 LAB — GLUCOSE, CAPILLARY
GLUCOSE-CAPILLARY: 119 mg/dL — AB (ref 70–99)
Glucose-Capillary: 102 mg/dL — ABNORMAL HIGH (ref 70–99)
Glucose-Capillary: 105 mg/dL — ABNORMAL HIGH (ref 70–99)
Glucose-Capillary: 92 mg/dL (ref 70–99)
Glucose-Capillary: 96 mg/dL (ref 70–99)
Glucose-Capillary: 97 mg/dL (ref 70–99)
Glucose-Capillary: 99 mg/dL (ref 70–99)

## 2013-09-10 LAB — PHOSPHORUS: PHOSPHORUS: 4.2 mg/dL (ref 2.3–4.6)

## 2013-09-10 LAB — PRO B NATRIURETIC PEPTIDE: PRO B NATRI PEPTIDE: 421.3 pg/mL — AB (ref 0–125)

## 2013-09-10 LAB — MAGNESIUM: Magnesium: 1.9 mg/dL (ref 1.5–2.5)

## 2013-09-10 MED ORDER — LORAZEPAM 0.5 MG PO TABS
0.5000 mg | ORAL_TABLET | Freq: Two times a day (BID) | ORAL | Status: DC | PRN
  Administered 2013-09-10 – 2013-09-11 (×2): 0.5 mg via ORAL
  Filled 2013-09-10 (×3): qty 1

## 2013-09-10 NOTE — Progress Notes (Signed)
Pt taken off the vent and placed on 28% ATC and is tolerating well at this time. No complications noted. RT will monitor.

## 2013-09-10 NOTE — Evaluation (Signed)
Physical Therapy Evaluation and D/C Patient Details Name: [name] MRN: [id number] DOB: 04/08/1951 Today's Date: 09/10/2013 Time: [id number] PT Time Calculation (min): 22 min  PT Assessment / Plan / Recommendation History of Present Illness  Pt presents with COPD exacerbation.   Clinical Impression  Pt admitted with above. Pt currently with functional limitations however had these limitations prior to admit and is at her baseline status.  Pt in NH prior to admit and was lifted with mechanical lift to wheelchair.  Total care for all other care.  Pt will not benefit from skilled PT.  Recommend nursing lift to chair as tolerated daily.  Will sign off.  Thanks.      PT Assessment  Patent does not need any further PT services    Follow Up Recommendations  No PT follow up    Does the patient have the potential to tolerate intense rehabilitation      Barriers to Discharge        Equipment Recommendations  None recommended by PT    Recommendations for Other Services     Frequency      Precautions / Restrictions Precautions Precautions: Fall Restrictions Weight Bearing Restrictions: No   Pertinent Vitals/Pain VSS, No pain       Mobility  Bed Mobility Overal bed mobility: Needs Assistance;+2 for physical assistance Bed Mobility: Supine to Sit Supine to sit: Max assist;+2 for physical assistance General bed mobility comments: Used rail, needs incr assist for LEs and elevation of trunk Transfers Overall transfer level: Needs assistance Equipment used: None Transfers: Sit to/from Stand Sit to Stand: Total assist;+2 physical assistance General transfer comment: Pt could not place weight on LEs enough to clear bottom off of bed with 2 person total assist.  3 attempts with use of pad futile.  Obtained lift pad and used maxi sky to lift pt to chair since pt already sitting on side of bed.      Exercises General Exercises - Lower Extremity Ankle Circles/Pumps: AROM;Both;10  reps;Supine Long Arc Quad: AROM;Both;10 reps;Seated   PT Diagnosis:    PT Problem List:   PT Treatment Interventions:       PT Goals(Current goals can be found in the care plan section)    Visit Information  Last PT Received On: 09/10/13 Assistance Needed: +2 History of Present Illness: Pt presents with COPD exacerbation.        Prior Functioning  Home Living Family/patient expects to be discharged to:: Skilled nursing facility Prior Function Level of Independence: Needs assistance Gait / Transfers Assistance Needed: pt is mod assist for bed mobility but total assist of hoyer for OOB to WC does not ambulate ADL's / Homemaking Assistance Needed: total assist for all ADLs Communication Communication: No difficulties Dominant Hand: Right    Cognition  Cognition Arousal/Alertness: Awake/alert Behavior During Therapy: WFL for tasks assessed/performed Overall Cognitive Status: Within Functional Limits for tasks assessed    Extremity/Trunk Assessment Upper Extremity Assessment Upper Extremity Assessment: Defer to OT evaluation Lower Extremity Assessment Lower Extremity Assessment: RLE deficits/detail;LLE deficits/detail RLE Deficits / Details: grossly 2-/5 LLE Deficits / Details: grossly 2-/5 Cervical / Trunk Assessment Cervical / Trunk Assessment: Other exceptions Cervical / Trunk Exceptions: forward head and shoulders.   Balance Balance Overall balance assessment: Needs assistance;History of Falls Sitting-balance support: Bilateral upper extremity supported;Feet supported Sitting balance-Leahy Scale: Fair Sitting balance - Comments: Can sit EOB with supervision for 8 min.  Can reach at least 10 inches.   End of Session PT -  End of Session Equipment Utilized During Treatment: Gait belt;Oxygen (trach collar at 28%) Activity Tolerance: Patient limited by fatigue Patient left: in chair;with call bell/phone within reach Nurse Communication: Mobility status;Need for lift  equipment  GP     INGOLD,Rafiq Bucklin 09/10/2013, 11:23 AM [facility] [phone] (pager)

## 2013-09-10 NOTE — Progress Notes (Signed)
PULMONARY  / CRITICAL CARE MEDICINE HISTORY AND PHYSICAL EXAMINATION  Name: [name] MRN: [id number] DOB: 12/26/1950    ADMISSION DATE:  09/07/2013  CHIEF COMPLAINT:  Shortness of breath.  BRIEF PATIENT DESCRIPTION: 63 year old female with OSA/OHS, chronic respiratory failure requiring nighttime mechanical ventilation via tracheostomy admitted 3/7 with hypercarbic respiratory failure likely secondary to inadequately treated obesity hypoventilation syndrome +/- URI.   SIGNIFICANT EVENTS / STUDIES:  3/7 Chest CTA  >>> No obvious pulmonary embolism, atelectasis, and no acute consolidation  LINES / TUBES: Chronic trach>>>  CULTURES: 3/7  Blood >>> 3/8  MRSA PCR >>> neg  ANTIBIOTICS:  INTERVAL HISTORY:  No acute overnight events.  PHYSICAL EXAM  VITAL SIGNS: Temp:  [98 F (36.7 C)-98.5 F (36.9 C)] 98.5 F (36.9 C) (03/10 1123) Pulse Rate:  [67-100] 69 (03/10 1123) Resp:  [13-28] 26 (03/10 1123) BP: (76-150)/(37-88) 149/65 mmHg (03/10 1123) SpO2:  [92 %-100 %] 98 % (03/10 1123) FiO2 (%):  [28 %-40 %] 28 % (03/10 1123) Weight:  [132.45 kg (292 lb)] 132.45 kg (292 lb) (03/10 0600)  HEMODYNAMICS:   VENTILATOR SETTINGS: Vent Mode:  [-] PRVC FiO2 (%):  [28 %-40 %] 28 % Set Rate:  [12 bmp] 12 bmp Vt Set:  [500 mL] 500 mL PEEP:  [5 cmH20] 5 cmH20 Plateau Pressure:  [27 cmH20-28 cmH20] 27 cmH20  INTAKE / OUTPUT: Intake/Output     03/09 0701 - 03/10 0700 03/10 0701 - 03/11 0700   P.O. 1560    I.V. (mL/kg)     Other  3   Total Intake(mL/kg) 1560 (11.8) 3 (0)   Urine (mL/kg/hr) 800 (0.3) 200 (0.3)   Total Output 800 200   Net +760 -197        Urine Occurrence 3 x    Stool Occurrence 3 x      PHYSICAL EXAMINATION: General:  Resting comfortable, no distress Neuro:  Awake, alert HEENT:  Moist membranes Neck:  Tracheostomy site intact Cardiovascular:  Regular, no murmurs Lungs:  Bilateral air entry, few rhonchi Abdomen:  Obese, nontender, nondistended,  positive bowel sounds. Musculoskeletal:  No edema Skin:  Chronic changes LE BL  LABS: CBC  Recent Labs Lab 09/07/13 1608 09/08/13 0535 09/09/13 1240  WBC 7.6 8.5 7.0  HGB 10.2* 10.3* 10.4*  HCT 32.9* 33.2* 32.7*  PLT 124* 195 203   Coag's No results found for this basename: APTT, INR,  in the last 168 hours BMET  Recent Labs Lab 09/07/13 1608 09/08/13 0535 09/10/13 0255  NA 143  --  145  K 4.8  --  4.3  CL 95*  --  96  CO2 38*  --  38*  BUN 12  --  22  CREATININE 0.70 0.67 0.90  GLUCOSE 123*  --  102*   Electrolytes  Recent Labs Lab 09/07/13 1608 09/10/13 0255  CALCIUM 9.4 9.3  MG  --  1.9  PHOS  --  4.2   Sepsis Markers  Recent Labs Lab 09/07/13 1626  LATICACIDVEN 1.55   ABG  Recent Labs Lab 09/07/13 2333 09/08/13 0120 09/08/13 0718  PHART 7.287* 7.395 7.289*  PCO2ART 96.8* 67.6* 93.7*  PO2ART 76.0* 45.0* 75.0*   Liver Enzymes  Recent Labs Lab 09/07/13 1608  AST 24  ALT 11  ALKPHOS 61  BILITOT <0.2*  ALBUMIN 3.1*   Cardiac Enzymes  Recent Labs Lab 09/10/13 0255  PROBNP 421.3*   Glucose  Recent Labs Lab 09/09/13 1205 09/09/13 1533 09/09/13 2017 09/09/13 2358 09/10/13  0411 09/10/13 0747  GLUCAP 94 118* 126* 92 97 96   IMAGING: Dg Chest Port 1 View  09/09/2013   CLINICAL DATA:  Respiratory failure.  EXAM: PORTABLE CHEST - 1 VIEW  COMPARISON:  09/07/2013  FINDINGS: Tracheostomy tube in adequate position. Lungs are moderately hypoinflated with mild stable elevation of the right hemidiaphragm. No definite consolidation or effusion. Mild stable prominence of the cardiac silhouette. Remainder of the exam is unchanged.  IMPRESSION: Moderate hypoinflation without acute cardiopulmonary disease.  Stable mild cardiomegaly.   Electronically Signed   By: Elberta Fortisaniel  Boyle M.D.   On: 09/09/2013 07:52   ASSESSMENT AND PLAN:  PULMONARY A: Acute on chronic hypercarbic respiratory failure  OSA/OHS  Has hx "COPD" listed but highly doubt P:   Goal SpO2>92 ATC during day time Nocturnal ventilation Albuterol / Brovana / Pulmocort  CARDIOVASCULAR HTN  P:  Norvasc, Clonidine, Imdur, Zocor  RENAL A:  No active issues P: Trend BMP  GASTROINTESTINAL A: Nutrition  GI Px P:  Carb modified diet Pepcid  HEMATOLOGIC A: Anemia of chronic disease  VTE Px P:  Trend CBC Heparin  INFECTIOUS A: URI, likely viral P: No intervention required  ENDOCRINE A: DM P:  SSI   NEUROLOGIC A: Bipolar mood disorder P:  Depakote, Abilify, Buspar, Cymbalta Add ativan PRN  I have personally obtained history, examined patient, evaluated and interpreted laboratory and imaging results, reviewed medical records, formulated assessment / plan and placed orders.  Lonia FarberZUBELEVITSKIY, Shandell Jallow, MD Pulmonary and Critical Care Medicine [facility] Pager: [phone]  09/10/2013, 12:08 PM

## 2013-09-11 DIAGNOSIS — I5031 Acute diastolic (congestive) heart failure: Secondary | ICD-10-CM

## 2013-09-11 DIAGNOSIS — I509 Heart failure, unspecified: Secondary | ICD-10-CM

## 2013-09-11 LAB — GLUCOSE, CAPILLARY
GLUCOSE-CAPILLARY: 108 mg/dL — AB (ref 70–99)
GLUCOSE-CAPILLARY: 117 mg/dL — AB (ref 70–99)
GLUCOSE-CAPILLARY: 120 mg/dL — AB (ref 70–99)
Glucose-Capillary: 100 mg/dL — ABNORMAL HIGH (ref 70–99)
Glucose-Capillary: 109 mg/dL — ABNORMAL HIGH (ref 70–99)
Glucose-Capillary: 99 mg/dL (ref 70–99)

## 2013-09-11 LAB — CBC
HCT: 32.5 % — ABNORMAL LOW (ref 36.0–46.0)
HEMOGLOBIN: 10.2 g/dL — AB (ref 12.0–15.0)
MCH: 26.6 pg (ref 26.0–34.0)
MCHC: 31.4 g/dL (ref 30.0–36.0)
MCV: 84.6 fL (ref 78.0–100.0)
Platelets: 195 10*3/uL (ref 150–400)
RBC: 3.84 MIL/uL — AB (ref 3.87–5.11)
RDW: 15.2 % (ref 11.5–15.5)
WBC: 6.6 10*3/uL (ref 4.0–10.5)

## 2013-09-11 LAB — BASIC METABOLIC PANEL
BUN: 18 mg/dL (ref 6–23)
CALCIUM: 9.4 mg/dL (ref 8.4–10.5)
CO2: 36 meq/L — AB (ref 19–32)
Chloride: 98 mEq/L (ref 96–112)
Creatinine, Ser: 0.84 mg/dL (ref 0.50–1.10)
GFR calc Af Amer: 83 mL/min — ABNORMAL LOW (ref >90)
GFR calc non Af Amer: 72 mL/min — ABNORMAL LOW (ref >90)
GLUCOSE: 103 mg/dL — AB (ref 70–99)
POTASSIUM: 4.7 meq/L (ref 3.7–5.3)
Sodium: 142 mEq/L (ref 137–147)

## 2013-09-11 NOTE — Progress Notes (Signed)
PULMONARY  / CRITICAL CARE MEDICINE  Name: [name] MRN: [id number] DOB: Apr 09, 1951    ADMISSION DATE:  09/07/2013  CHIEF COMPLAINT:  Shortness of breath.  BRIEF PATIENT DESCRIPTION: 63 year old female with OSA/OHS, chronic respiratory failure requiring nighttime mechanical ventilation via tracheostomy admitted 3/7 with hypercarbic respiratory failure likely secondary to inadequately treated obesity hypoventilation syndrome +/- URI.   SIGNIFICANT EVENTS / STUDIES:  3/7 Chest CTA  >>> No obvious pulmonary embolism, atelectasis, and no acute consolidation  LINES / TUBES: Chronic trach>>>  CULTURES: 3/7  Blood >>> 3/8  MRSA PCR >>> neg  ANTIBIOTICS:  INTERVAL HISTORY:  Placed on vent overnight.  PHYSICAL EXAM  VITAL SIGNS: Temp:  [98.2 F (36.8 C)-98.6 F (37 C)] 98.6 F (37 C) (03/11 1200) Pulse Rate:  [34-83] 81 (03/11 1140) Resp:  [12-28] 19 (03/11 1140) BP: (117-158)/(46-85) 125/46 mmHg (03/11 1200) SpO2:  [94 %-100 %] 98 % (03/11 1140) FiO2 (%):  [28 %-30 %] 28 % (03/11 1200)  HEMODYNAMICS:   VENTILATOR SETTINGS: Vent Mode:  [-] Stand-by;Other (Comment) FiO2 (%):  [28 %-30 %] 28 % Set Rate:  [12 bmp] 12 bmp Vt Set:  [500 mL] 500 mL PEEP:  [5 cmH20] 5 cmH20 Plateau Pressure:  [22 cmH20-24 cmH20] 24 cmH20  INTAKE / OUTPUT: Intake/Output     03/10 0701 - 03/11 0700 03/11 0701 - 03/12 0700   P.O.  360   Other 3    Total Intake(mL/kg) 3 (0) 360 (2.7)   Urine (mL/kg/hr) 700 (0.2)    Total Output 700     Net -697 +360        Urine Occurrence 4 x 2 x   Stool Occurrence  1 x     PHYSICAL EXAMINATION: General:  Comfortable Neuro:  Awake, alert, verbalizing  HEENT:  Moist membranes Neck:  Tracheostomy site intact, minimal secretions Cardiovascular:  Regular, no murmurs Lungs:  Bilateral rhonchi Abdomen:  Obese, nontender, nondistended, positive bowel sounds. Musculoskeletal:  No edema Skin:  Chronic changes LE BL  LABS: CBC  Recent Labs Lab  09/08/13 0535 09/09/13 1240 09/11/13 0520  WBC 8.5 7.0 6.6  HGB 10.3* 10.4* 10.2*  HCT 33.2* 32.7* 32.5*  PLT 195 203 195   Coag's No results found for this basename: APTT, INR,  in the last 168 hours BMET  Recent Labs Lab 09/07/13 1608 09/08/13 0535 09/10/13 0255 09/11/13 0520  NA 143  --  145 142  K 4.8  --  4.3 4.7  CL 95*  --  96 98  CO2 38*  --  38* 36*  BUN 12  --  22 18  CREATININE 0.70 0.67 0.90 0.84  GLUCOSE 123*  --  102* 103*   Electrolytes  Recent Labs Lab 09/07/13 1608 09/10/13 0255 09/11/13 0520  CALCIUM 9.4 9.3 9.4  MG  --  1.9  --   PHOS  --  4.2  --    Sepsis Markers  Recent Labs Lab 09/07/13 1626  LATICACIDVEN 1.55   ABG  Recent Labs Lab 09/07/13 2333 09/08/13 0120 09/08/13 0718  PHART 7.287* 7.395 7.289*  PCO2ART 96.8* 67.6* 93.7*  PO2ART 76.0* 45.0* 75.0*   Liver Enzymes  Recent Labs Lab 09/07/13 1608  AST 24  ALT 11  ALKPHOS 61  BILITOT <0.2*  ALBUMIN 3.1*   Cardiac Enzymes  Recent Labs Lab 09/10/13 0255  PROBNP 421.3*   Glucose  Recent Labs Lab 09/10/13 1622 09/10/13 2130 09/11/13 0029 09/11/13 0450 09/11/13 0754 09/11/13 1211  GLUCAP 105* 119* 108* 100* 109* 99   IMAGING: No results found.  ASSESSMENT AND PLAN:  PULMONARY A: Acute on chronic hypercarbic respiratory failure OSA/OHS  Has hx "COPD" listed but highly doubt  P:  Goal SpO2>92 ATC as long as tolerated Nocturnal ventilation PRN ONLY Check ABG in AM to evaluate for acute hypercarbia off vent Albuterol / Brovana / Pulmocort  CARDIOVASCULAR HTN  P:  Norvasc, Clonidine, Imdur, Zocor  RENAL A:  No active issues P: Trend BMP  GASTROINTESTINAL A: Nutrition  GI Px P:  Carb modified diet Pepcid  HEMATOLOGIC A: Anemia of chronic disease  VTE Px P:  Trend CBC Heparin  INFECTIOUS A: URI, likely viral P: No intervention required  ENDOCRINE A: DM P:  SSI   NEUROLOGIC A: Bipolar mood disorder P:  Depakote,  Abilify, Buspar, Cymbalta Ativan PRN  I have personally obtained history, examined patient, evaluated and interpreted laboratory and imaging results, reviewed medical records, formulated assessment / plan and placed orders.  Lonia FarberZUBELEVITSKIY, Jamonte Curfman, MD Pulmonary and Critical Care Medicine [facility] Pager: [phone]  09/11/2013, 12:31 PM

## 2013-09-11 NOTE — Progress Notes (Signed)
Speech Language Pathology Treatment: Nada Boozer Speaking valve  Patient Details Name: [name] MRN: [id number] DOB: 11-06-1950 Today's Date: 09/11/2013 Time: 1500-1530 SLP Time Calculation (min): 30 min  Assessment / Plan / Recommendation Clinical Impression  Pt seated in chair.  PMSV in place.  Pt able to phonate well, and is fully intelligible.  VSS throughout session.  Pt was observed placing and removing PMSV.  Encouraged use of purple cup to hold PMSV at night.  Provided education re: cleaning of valve and allowing to air dry over night, importance of cuff deflation, and when to remove valve (during sleep and breathing treatments).  Precautions posted at head of bed and on front of chart.  Discussed status with RN, who reports pt may be DC'd tomorrow.   HPI HPI: 63 year old female with OSA/OHS, chronic respiratory failure requiring nighttime mechanical ventilation via tracheostomy admitted 3/7 with hypercarbic respiratory failure likely secondary to inadequately treated obesity hypoventilation syndrome +/- URI.  Pt has been seen by SLP services during prior admissions.  Has had success with PMSV in the past.     Pertinent Vitals VSS  SLP Plan  All goals met;Discharge SLP treatment due to (comment)    Recommendations  ST available if needs arise in the future.      Patient may use Passy-Muir Speech Valve: During all waking hours (remove during sleep);During PO intake/meals;Caregiver trained to provide supervision PMSV Supervision: Intermittent MD: Please consider changing trach tube to : Smaller size;Cuffless       Oral Care Recommendations: Oral care BID Follow up Recommendations: None Plan: All goals met;Discharge SLP treatment due to (comment)    GO   [name] [facility], CCC-SLP [phone]  Shonna Chock 09/11/2013, 3:36 PM

## 2013-09-12 LAB — GLUCOSE, CAPILLARY
GLUCOSE-CAPILLARY: 115 mg/dL — AB (ref 70–99)
GLUCOSE-CAPILLARY: 92 mg/dL (ref 70–99)
Glucose-Capillary: 108 mg/dL — ABNORMAL HIGH (ref 70–99)
Glucose-Capillary: 110 mg/dL — ABNORMAL HIGH (ref 70–99)
Glucose-Capillary: 116 mg/dL — ABNORMAL HIGH (ref 70–99)
Glucose-Capillary: 121 mg/dL — ABNORMAL HIGH (ref 70–99)

## 2013-09-12 LAB — BLOOD GAS, ARTERIAL
ACID-BASE EXCESS: 11 mmol/L — AB (ref 0.0–2.0)
Bicarbonate: 37 mEq/L — ABNORMAL HIGH (ref 20.0–24.0)
Drawn by: 347621
FIO2: 0.28 %
O2 SAT: 91.7 %
PATIENT TEMPERATURE: 98.6
PCO2 ART: 70.1 mmHg — AB (ref 35.0–45.0)
TCO2: 39.2 mmol/L (ref 0–100)
pH, Arterial: 7.342 — ABNORMAL LOW (ref 7.350–7.450)
pO2, Arterial: 67.5 mmHg — ABNORMAL LOW (ref 80.0–100.0)

## 2013-09-12 MED ORDER — ACETAMINOPHEN 325 MG PO TABS
650.0000 mg | ORAL_TABLET | Freq: Four times a day (QID) | ORAL | Status: DC | PRN
  Administered 2013-09-12 – 2013-09-20 (×7): 650 mg via ORAL
  Filled 2013-09-12 (×7): qty 2
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
## 2013-09-12 NOTE — Progress Notes (Signed)
Clinical Social Work Department BRIEF PSYCHOSOCIAL ASSESSMENT 09/12/2013  Patient:  [name],[name]     Account Number:  [id number]     Admit date:  09/07/2013  Clinical Social Worker:  Harless NakayamaAMBELAL,Teyana Pierron, LCSWA  Date/Time:  09/12/2013 03:00 PM  Referred by:  Physician  Date Referred:  09/12/2013 Referred for  SNF Placement   Other Referral:   Interview type:  Patient Other interview type:    PSYCHOSOCIAL DATA Living Status:  FACILITY Admitted from facility[facility] Level of care:  Skilled Nursing Facility Primary support name:  Arelia LongestJoan Brooks [phone] Primary support relationship to patient:  CHILD, ADULT Degree of support available:   Pt has family support    CURRENT CONCERNS Current Concerns  Post-Acute Placement   Other Concerns:    SOCIAL WORK ASSESSMENT / PLAN CSW informed pt will be put back on nocturnal vent and will require placement at vent SNF. CSW spoke with pt about options. Pt is understanding that referals may be made outside of [place]. Pt stated "if that is what I need, I'll do what I have to". Pt asked for CSW to please leave contact information so she could provide it to her daughter in case she had questions.    CSW has spoken with multiple facilities in [place] and facilities are either requiring that pt have potential to wean or are on vent for 10+ hrs of the day. CSW has sent referal to [facility] and awaiting determination.   Assessment/plan status:  Psychosocial Support/Ongoing Assessment of Needs Other assessment/ plan:   Information/referral to community resources:   Will provide with bed offers when available    PATIENT'S/FAMILY'S RESPONSE TO PLAN OF CARE: Pt is agreeable to vent SNF       Kashlynn Kundert, LCSWA [phone]

## 2013-09-12 NOTE — Progress Notes (Signed)
ABG results: ph 7.34, CO2 70.1, 02 67.5, bicarb 37. Dr. Bard HerbertSimmonds made aware.

## 2013-09-12 NOTE — Progress Notes (Signed)
eLink Physician-Brief Progress Note Patient Name: Turner DanielsCarolyn R Longshore DOB: 02/05/1951 MRN: [id number]  Date of Service  09/12/2013   HPI/Events of Note   Headache, achiness  eICU Interventions  apap prn   Intervention Category Intermediate Interventions: Pain - evaluation and management  MCQUAID, DOUGLAS 09/12/2013, 5:27 PM

## 2013-09-12 NOTE — Progress Notes (Signed)
PULMONARY  / CRITICAL CARE MEDICINE  Name: [name] MRN: [id number] DOB: 16-Feb-1951    ADMISSION DATE:  09/07/2013  CHIEF COMPLAINT:  Shortness of breath.  BRIEF PATIENT DESCRIPTION: 63 year old female with OSA/OHS, chronic respiratory failure requiring nighttime mechanical ventilation via tracheostomy admitted 3/7 with hypercarbic respiratory failure likely secondary to inadequately treated obesity hypoventilation syndrome +/- URI.   SIGNIFICANT EVENTS / STUDIES:  3/7 Chest CTA  >>> No obvious pulmonary embolism, atelectasis, and no acute consolidation  LINES / TUBES: Chronic trach>>>  CULTURES: 3/7  Blood >>> 3/8  MRSA PCR >>> neg  ANTIBIOTICS:  INTERVAL HISTORY:  Off vent support overnight with evidence of acute on chronic CO2 retention overnight.  PHYSICAL EXAM  VITAL SIGNS: Temp:  [98 F (36.7 C)-98.6 F (37 C)] 98.3 F (36.8 C) (03/12 0720) Pulse Rate:  [73-85] 76 (03/12 0720) Resp:  [16-30] 17 (03/12 0720) BP: (115-161)/(46-90) 161/83 mmHg (03/12 0904) SpO2:  [95 %-100 %] 97 % (03/12 0904) FiO2 (%):  [28 %] 28 % (03/12 0904) Weight:  [130.5 kg (287 lb 11.2 oz)] 130.5 kg (287 lb 11.2 oz) (03/12 0500)  HEMODYNAMICS:   VENTILATOR SETTINGS: Vent Mode:  [-]  FiO2 (%):  [28 %] 28 %  INTAKE / OUTPUT: Intake/Output     03/11 0701 - 03/12 0700 03/12 0701 - 03/13 0700   P.O. 840    Other     Total Intake(mL/kg) 840 (6.4)    Urine (mL/kg/hr) 650 (0.2)    Total Output 650     Net +190          Urine Occurrence 7 x    Stool Occurrence 1 x      PHYSICAL EXAMINATION: General:  Comfortable Neuro:  Awake, alert HEENT:  Moist membranes Neck:  Tracheostomy site intact Cardiovascular:  Distant heart sounds Lungs:  Few bilateral rhonchi Abdomen:  Obese, nontender, nondistended, positive bowel sounds. Musculoskeletal:  No edema Skin:  Chronic changes LE BL  LABS: CBC  Recent Labs Lab 09/08/13 0535 09/09/13 1240 09/11/13 0520  WBC 8.5 7.0 6.6  HGB  10.3* 10.4* 10.2*  HCT 33.2* 32.7* 32.5*  PLT 195 203 195   Coag's No results found for this basename: APTT, INR,  in the last 168 hours BMET  Recent Labs Lab 09/07/13 1608 09/08/13 0535 09/10/13 0255 09/11/13 0520  NA 143  --  145 142  K 4.8  --  4.3 4.7  CL 95*  --  96 98  CO2 38*  --  38* 36*  BUN 12  --  22 18  CREATININE 0.70 0.67 0.90 0.84  GLUCOSE 123*  --  102* 103*   Electrolytes  Recent Labs Lab 09/07/13 1608 09/10/13 0255 09/11/13 0520  CALCIUM 9.4 9.3 9.4  MG  --  1.9  --   PHOS  --  4.2  --    Sepsis Markers  Recent Labs Lab 09/07/13 1626  LATICACIDVEN 1.55   ABG  Recent Labs Lab 09/08/13 0120 09/08/13 0718 09/12/13 0635  PHART 7.395 7.289* 7.342*  PCO2ART 67.6* 93.7* 70.1*  PO2ART 45.0* 75.0* 67.5*   Liver Enzymes  Recent Labs Lab 09/07/13 1608  AST 24  ALT 11  ALKPHOS 61  BILITOT <0.2*  ALBUMIN 3.1*   Cardiac Enzymes  Recent Labs Lab 09/10/13 0255  PROBNP 421.3*   Glucose  Recent Labs Lab 09/11/13 1211 09/11/13 1555 09/11/13 2002 09/12/13 0009 09/12/13 0412 09/12/13 0719  GLUCAP 99 120* 117* 121* 115* 108*   IMAGING:  No results found.  ASSESSMENT AND PLAN:  PULMONARY A: Acute on chronic hypercarbic respiratory failure OSA/OHS  Has hx "COPD" listed but highly doubt  Acute on chronic hypercarbia when off vent overnight P:  Goal SpO2>92 ATC during day time Mandatory nocturnal ventilation Would require chronic vent facility Albuterol / Brovana / Pulmocort  CARDIOVASCULAR HTN  P:  Norvasc, Clonidine, Imdur, Zocor  RENAL A:  No active issues P: Trend BMP  GASTROINTESTINAL A: Nutrition  GI Px P:  Carb modified diet Pepcid  HEMATOLOGIC A: Anemia of chronic disease  VTE Px P:  Trend CBC Heparin  INFECTIOUS A: URI, likely viral P: No intervention required  ENDOCRINE A: DM P:  SSI   NEUROLOGIC A: Bipolar mood disorder P:  Depakote, Abilify, Buspar, Cymbalta D/c  Ativan  I have personally obtained history, examined patient, evaluated and interpreted laboratory and imaging results, reviewed medical records, formulated assessment / plan and placed orders.  Lonia FarberZUBELEVITSKIY, Shandell Jallow, MD Pulmonary and Critical Care Medicine [facility] Pager: [phone]  09/12/2013, 11:15 AM

## 2013-09-12 NOTE — Progress Notes (Addendum)
Clinical Social Work Department CLINICAL SOCIAL WORK PLACEMENT NOTE 09/12/2013  Patient:  Turner DanielsFLORENCE,Shanterica R  Account Number:  [id number] Admit date:  09/07/2013  Clinical Social Worker:  Sharol HarnessPOONUM AMBELAL, Theresia MajorsLCSWA  Date/time:  09/12/2013 03:30 PM  Clinical Social Work is seeking post-discharge placement for this patient at the following level of care:   SKILLED NURSING   (*CSW will update this form in Epic as items are completed)   09/12/2013  Patient/family provided with [facility] Department of Clinical Social Work's list of facilities offering this level of care within the geographic area requested by the patient (or if unable, by the patient's family).  09/12/2013  Patient/family informed of their freedom to choose among providers that offer the needed level of care, that participate in Medicare, Medicaid or managed care program needed by the patient, have an available bed and are willing to accept the patient.  09/12/2013  Patient/family informed of [facility]' ownership interest in [facility], as well as of the fact that they are under no obligation to receive care at this facility.  PASARR submitted to [employer] on Existing PASARR number received from [employer] on   FL2 transmitted to all facilities in geographic area requested by pt/family on  09/12/2013 FL2 transmitted to all facilities within larger geographic area on 09/12/2013  Patient informed that his/her managed care company has contracts with or will negotiate with  certain facilities, including the following:    Patient/family informed of bed offers received:  09/23/13 (called pt's daughter and brother and left voicemails for both of them letting them know pt got a bed offer [facility] in [place] and will be discharged tomorrow) [facility] Physician recommends and patient chooses bed at    Patient to be transferred to [facility] on   09/24/13 [facility]  The following physician request were entered in Epic:   Additional Comments: Pt discharging via [facility] today at 2pm. Pt expressed being ready to go. Family informed. Facility informed and discharge summary sent to administrator. Discharge packet placed on pt's chart, and RN informed.   Harless Nakayamaoonum Ambelal, LCSWA [phone]   Maryclare LabradorJulie Ruth Tully, MSW, [facility] Clinical Social Worker [phone]

## 2013-09-13 LAB — BASIC METABOLIC PANEL
BUN: 13 mg/dL (ref 6–23)
CHLORIDE: 97 meq/L (ref 96–112)
CO2: 33 mEq/L — ABNORMAL HIGH (ref 19–32)
Calcium: 9.5 mg/dL (ref 8.4–10.5)
Creatinine, Ser: 0.8 mg/dL (ref 0.50–1.10)
GFR, EST AFRICAN AMERICAN: 88 mL/min — AB (ref >90)
GFR, EST NON AFRICAN AMERICAN: 76 mL/min — AB (ref >90)
Glucose, Bld: 99 mg/dL (ref 70–99)
POTASSIUM: 4.2 meq/L (ref 3.7–5.3)
Sodium: 142 mEq/L (ref 137–147)

## 2013-09-13 LAB — BLOOD GAS, ARTERIAL
ACID-BASE EXCESS: 10.6 mmol/L — AB (ref 0.0–2.0)
BICARBONATE: 36.6 meq/L — AB (ref 20.0–24.0)
Drawn by: 244801
FIO2: 0.28 %
O2 SAT: 93.9 %
PCO2 ART: 70.5 mmHg — AB (ref 35.0–45.0)
PO2 ART: 74.9 mmHg — AB (ref 80.0–100.0)
Patient temperature: 98.6
TCO2: 38.8 mmol/L (ref 0–100)
pH, Arterial: 7.336 — ABNORMAL LOW (ref 7.350–7.450)

## 2013-09-13 LAB — CULTURE, BLOOD (ROUTINE X 2): CULTURE: NO GROWTH

## 2013-09-13 LAB — CBC
HCT: 32.3 % — ABNORMAL LOW (ref 36.0–46.0)
Hemoglobin: 10.3 g/dL — ABNORMAL LOW (ref 12.0–15.0)
MCH: 26.8 pg (ref 26.0–34.0)
MCHC: 31.9 g/dL (ref 30.0–36.0)
MCV: 84.1 fL (ref 78.0–100.0)
PLATELETS: 197 10*3/uL (ref 150–400)
RBC: 3.84 MIL/uL — ABNORMAL LOW (ref 3.87–5.11)
RDW: 15.2 % (ref 11.5–15.5)
WBC: 5.9 10*3/uL (ref 4.0–10.5)

## 2013-09-13 LAB — GLUCOSE, CAPILLARY
GLUCOSE-CAPILLARY: 107 mg/dL — AB (ref 70–99)
GLUCOSE-CAPILLARY: 117 mg/dL — AB (ref 70–99)
Glucose-Capillary: 111 mg/dL — ABNORMAL HIGH (ref 70–99)
Glucose-Capillary: 100 mg/dL — ABNORMAL HIGH (ref 70–99)
Glucose-Capillary: 106 mg/dL — ABNORMAL HIGH (ref 70–99)
Glucose-Capillary: 141 mg/dL — ABNORMAL HIGH (ref 70–99)

## 2013-09-13 NOTE — Progress Notes (Signed)
CRITICAL VALUE ALERT  Critical value received:  CO2  Date of notification:  09/13/13  Time of notification:  0857  Critical value read back:yes  Nurse who received alert:  Nicole CellaStephanie Yevonne Yokum, RN  MD notified (1st page):  Dr De BurrsZubelivitsky  Time of first page:  0909  MD notified (2nd page):  Time of second page:  Responding MD:  Dr. Nelly RoutZubekivitsky  Time MD responded:  [phone]

## 2013-09-13 NOTE — Progress Notes (Signed)
Lab called with ABG results and one panic value (CO2). Dr Herma CarsonZ called and notified. PH  7.336 CO2  70.5 PO2  74.9 Bicarb  36.6

## 2013-09-13 NOTE — Progress Notes (Signed)
CSW (Clinical Child psychotherapistocial Worker) sent updated/requested clinicals to facility this morning. Since then, CSW has not heard from facility about placement. CSW has spoken with pt, pt daughter, and pt brother to update on current status. Both pt daughter and pt brother understanding of need for placement possibly in [place] but are not very happy with pt being so far from family. CSW offered support and understanding.    Conn Trombetta, LCSWA [phone]

## 2013-09-13 NOTE — Progress Notes (Signed)
PULMONARY  / CRITICAL CARE MEDICINE  Name: Alice DanielsCarolyn R Barreiro MRN: [id number] DOB: 12/12/1950    ADMISSION DATE:  09/07/2013  CHIEF COMPLAINT:  Shortness of breath.  BRIEF PATIENT DESCRIPTION: 63 year old female with OSA/OHS, chronic respiratory failure requiring nighttime mechanical ventilation via tracheostomy admitted 3/7 with hypercarbic respiratory failure likely secondary to inadequately treated obesity hypoventilation syndrome +/- URI.   SIGNIFICANT EVENTS / STUDIES:  3/7 Chest CTA  >>> No obvious pulmonary embolism, atelectasis, and no acute consolidation  LINES / TUBES: Chronic trach>>>  CULTURES: 3/7  Blood >>> 3/8  MRSA PCR >>> neg  ANTIBIOTICS:  INTERVAL HISTORY:  Not placed on vent overnight - hypercarbic again this am.  This was discussed with RN and the order was clarified.  PHYSICAL EXAM  VITAL SIGNS: Temp:  [97.8 F (36.6 C)-98.7 F (37.1 C)] 98.7 F (37.1 C) (03/13 1224) Pulse Rate:  [67-82] 78 (03/13 1224) Resp:  [17-27] 23 (03/13 1224) BP: (119-145)/(62-83) 136/70 mmHg (03/13 1224) SpO2:  [94 %-98 %] 97 % (03/13 1224) FiO2 (%):  [28 %] 28 % (03/13 1224) Weight:  [131.3 kg (289 lb 7.4 oz)] 131.3 kg (289 lb 7.4 oz) (03/13 0400)  HEMODYNAMICS:   VENTILATOR SETTINGS: Vent Mode:  [-]  FiO2 (%):  [28 %] 28 %  INTAKE / OUTPUT: Intake/Output     03/12 0701 - 03/13 0700 03/13 0701 - 03/14 0700   P.O. 1    Total Intake(mL/kg) 1 (0)    Urine (mL/kg/hr)     Total Output       Net +1          Urine Occurrence 8 x 1 x   Stool Occurrence 1 x      PHYSICAL EXAMINATION: General:  Comfortable Neuro:  Awake, alert, verbalizing  HEENT:  Moist membranes Neck:  Tracheostomy intact Cardiovascular:  Distant heart sounds Lungs:  Few rhonchi Abdomen:  Obese, nontender, nondistended, positive bowel sounds. Musculoskeletal:  No edema Skin:  Chronic changes LE BL  LABS: CBC  Recent Labs Lab 09/09/13 1240 09/11/13 0520 09/13/13 0253  WBC 7.0 6.6 5.9   HGB 10.4* 10.2* 10.3*  HCT 32.7* 32.5* 32.3*  PLT 203 195 197   Coag's No results found for this basename: APTT, INR,  in the last 168 hours BMET  Recent Labs Lab 09/10/13 0255 09/11/13 0520 09/13/13 0253  NA 145 142 142  K 4.3 4.7 4.2  CL 96 98 97  CO2 38* 36* 33*  BUN 22 18 13   CREATININE 0.90 0.84 0.80  GLUCOSE 102* 103* 99   Electrolytes  Recent Labs Lab 09/10/13 0255 09/11/13 0520 09/13/13 0253  CALCIUM 9.3 9.4 9.5  MG 1.9  --   --   PHOS 4.2  --   --    Sepsis Markers  Recent Labs Lab 09/07/13 1626  LATICACIDVEN 1.55   ABG  Recent Labs Lab 09/08/13 0718 09/12/13 0635 09/13/13 0846  PHART 7.289* 7.342* 7.336*  PCO2ART 93.7* 70.1* 70.5*  PO2ART 75.0* 67.5* 74.9*   Liver Enzymes  Recent Labs Lab 09/07/13 1608  AST 24  ALT 11  ALKPHOS 61  BILITOT <0.2*  ALBUMIN 3.1*   Cardiac Enzymes  Recent Labs Lab 09/10/13 0255  PROBNP 421.3*   Glucose  Recent Labs Lab 09/12/13 1702 09/12/13 2012 09/13/13 0011 09/13/13 0406 09/13/13 0751 09/13/13 1227  GLUCAP 116* 110* 107* 111* 100* 117*   IMAGING: No results found.  ASSESSMENT AND PLAN:  PULMONARY A: Acute on chronic hypercarbic respiratory failure OSA/OHS  Has hx "COPD" listed but highly doubt  Acute on chronic hypercarbia when off vent overnight P:  Goal SpO2>92 ATC during day time Mandatory nocturnal ventilation Would require chronic vent facility Albuterol / Brovana / Pulmocort  CARDIOVASCULAR HTN  P:  Norvasc, Clonidine, Imdur, Zocor  RENAL A:  No active issues P: Trend BMP  GASTROINTESTINAL A: Nutrition  GI Px P:  Carb modified diet Pepcid  HEMATOLOGIC A: Anemia of chronic disease  VTE Px P:  Trend CBC Heparin  INFECTIOUS A: URI, likely viral P: No intervention required  ENDOCRINE A: DM P:  SSI   NEUROLOGIC A: Bipolar mood disorder P:  Depakote, Abilify, Buspar, Cymbalta  I have personally obtained history, examined patient,  evaluated and interpreted laboratory and imaging results, reviewed medical records, formulated assessment / plan and placed orders.  Lonia Farber, MD Pulmonary and Critical Care Medicine [facility] Pager: [phone]  09/13/2013, 2:32 PM

## 2013-09-14 LAB — BASIC METABOLIC PANEL
BUN: 12 mg/dL (ref 6–23)
CO2: 35 meq/L — AB (ref 19–32)
Calcium: 9.5 mg/dL (ref 8.4–10.5)
Chloride: 97 mEq/L (ref 96–112)
Creatinine, Ser: 0.82 mg/dL (ref 0.50–1.10)
GFR calc Af Amer: 86 mL/min — ABNORMAL LOW (ref >90)
GFR, EST NON AFRICAN AMERICAN: 74 mL/min — AB (ref >90)
Glucose, Bld: 104 mg/dL — ABNORMAL HIGH (ref 70–99)
Potassium: 4.4 mEq/L (ref 3.7–5.3)
Sodium: 141 mEq/L (ref 137–147)

## 2013-09-14 LAB — GLUCOSE, CAPILLARY
GLUCOSE-CAPILLARY: 103 mg/dL — AB (ref 70–99)
GLUCOSE-CAPILLARY: 103 mg/dL — AB (ref 70–99)
GLUCOSE-CAPILLARY: 105 mg/dL — AB (ref 70–99)
GLUCOSE-CAPILLARY: 168 mg/dL — AB (ref 70–99)
GLUCOSE-CAPILLARY: 91 mg/dL (ref 70–99)
Glucose-Capillary: 110 mg/dL — ABNORMAL HIGH (ref 70–99)

## 2013-09-14 LAB — CBC
HEMATOCRIT: 34 % — AB (ref 36.0–46.0)
Hemoglobin: 10.9 g/dL — ABNORMAL LOW (ref 12.0–15.0)
MCH: 27.2 pg (ref 26.0–34.0)
MCHC: 32.1 g/dL (ref 30.0–36.0)
MCV: 84.8 fL (ref 78.0–100.0)
Platelets: 195 10*3/uL (ref 150–400)
RBC: 4.01 MIL/uL (ref 3.87–5.11)
RDW: 15.3 % (ref 11.5–15.5)
WBC: 6.6 10*3/uL (ref 4.0–10.5)

## 2013-09-14 LAB — CULTURE, BLOOD (ROUTINE X 2): Culture: NO GROWTH

## 2013-09-14 NOTE — Progress Notes (Signed)
PULMONARY  / CRITICAL CARE MEDICINE  Name: [name] MRN: [id number] DOB: 06-11-1951    ADMISSION DATE:  09/07/2013  CHIEF COMPLAINT:  Shortness of breath.  BRIEF PATIENT DESCRIPTION: 63 year old female with OSA/OHS, chronic respiratory failure requiring nighttime mechanical ventilation via tracheostomy admitted 3/7 with hypercarbic respiratory failure likely secondary to inadequately treated obesity hypoventilation syndrome +/- URI.   SIGNIFICANT EVENTS / STUDIES:  3/7 Chest CTA  >>> No obvious pulmonary embolism, atelectasis, and no acute consolidation  LINES / TUBES: Chronic trach>>>  CULTURES: 3/7  Blood >>> 3/8  MRSA PCR >>> neg  ANTIBIOTICS:  INTERVAL HISTORY:  Nurse ibn room. When asked, pt says she doesn't get quite enough air- now on Tcollar/ PMV.   PHYSICAL EXAM  VITAL SIGNS: Temp:  [98 F (36.7 C)-98.7 F (37.1 C)] 98 F (36.7 C) (03/14 0807) Pulse Rate:  [42-84] 81 (03/14 0807) Resp:  [16-29] 22 (03/14 0807) BP: (124-161)/(60-87) 154/87 mmHg (03/14 0807) SpO2:  [96 %-100 %] 97 % (03/14 0807) FiO2 (%):  [28 %-30 %] 28 % (03/14 0807) Weight:  [286 lb 2.5 oz (129.8 kg)] 286 lb 2.5 oz (129.8 kg) (03/14 0300)  HEMODYNAMICS:   VENTILATOR SETTINGS: Vent Mode:  [-] PRVC FiO2 (%):  [28 %-30 %] 28 % Set Rate:  [12 bmp] 12 bmp Vt Set:  [360 mL] 360 mL PEEP:  [5 cmH20] 5 cmH20 Plateau Pressure:  [17 cmH20] 17 cmH20  INTAKE / OUTPUT: Intake/Output     03/13 0701 - 03/14 0700 03/14 0701 - 03/15 0700   P.O. 720    Total Intake(mL/kg) 720 (5.5)    Urine (mL/kg/hr) 1075 (0.3)    Total Output 1075     Net -355          Urine Occurrence 2 x 1 x     PHYSICAL EXAMINATION: General:  Comfortable Neuro:  Awake, alert, verbalizing 1-2 word responses HEENT:  Moist membranes Neck:  Tracheostomy intact Cardiovascular:  Distant heart sounds Lungs:  Clear, shallow. Encouraged deep breaths Abdomen:  Obese, nontender, nondistended, positive bowel  sounds. Musculoskeletal:  No edema Skin:  Chronic changes LE BL  LABS: CBC  Recent Labs Lab 09/11/13 0520 09/13/13 0253 09/14/13 0400  WBC 6.6 5.9 6.6  HGB 10.2* 10.3* 10.9*  HCT 32.5* 32.3* 34.0*  PLT 195 197 195   Coag's No results found for this basename: APTT, INR,  in the last 168 hours BMET  Recent Labs Lab 09/11/13 0520 09/13/13 0253 09/14/13 0400  NA 142 142 141  K 4.7 4.2 4.4  CL 98 97 97  CO2 36* 33* 35*  BUN 18 13 12   CREATININE 0.84 0.80 0.82  GLUCOSE 103* 99 104*   Electrolytes  Recent Labs Lab 09/10/13 0255 09/11/13 0520 09/13/13 0253 09/14/13 0400  CALCIUM 9.3 9.4 9.5 9.5  MG 1.9  --   --   --   PHOS 4.2  --   --   --    Sepsis Markers  Recent Labs Lab 09/07/13 1626  LATICACIDVEN 1.55   ABG  Recent Labs Lab 09/08/13 0718 09/12/13 0635 09/13/13 0846  PHART 7.289* 7.342* 7.336*  PCO2ART 93.7* 70.1* 70.5*  PO2ART 75.0* 67.5* 74.9*   Liver Enzymes  Recent Labs Lab 09/07/13 1608  AST 24  ALT 11  ALKPHOS 61  BILITOT <0.2*  ALBUMIN 3.1*   Cardiac Enzymes  Recent Labs Lab 09/10/13 0255  PROBNP 421.3*   Glucose  Recent Labs Lab 09/13/13 1227 09/13/13 1627 09/13/13 1955 09/14/13  0012 09/14/13 0418 09/14/13 0753  GLUCAP 117* 106* 141* 103* 105* 91   IMAGING: No results found.  ASSESSMENT AND PLAN:  PULMONARY A: Acute on chronic hypercarbic respiratory failure OSA/OHS  Has hx "COPD" listed but highly doubt  Acute on chronic hypercarbia when off vent overnight P:  Goal SpO2>92 ATC during day time Mandatory nocturnal ventilation Would require chronic vent facility Albuterol / Brovana / Pulmocort Stimulate to take deep breaths  CARDIOVASCULAR HTN  P:  Norvasc, Clonidine, Imdur, Zocor  RENAL A: Renal compensation with sCO2 35 No active issues P: Trend BMP  GASTROINTESTINAL A: Nutrition  GI Px P:  Carb modified diet Pepcid  HEMATOLOGIC A: Anemia of chronic disease  VTE Px P:  Trend  CBC Heparin  INFECTIOUS A: URI, likely viral P: No intervention required  ENDOCRINE A: DM P:  SSI   NEUROLOGIC A: Bipolar mood disorder P:  Depakote, Abilify, Buspar, Cymbalta    Waymon BudgeYOUNG,CLINTON D, MD Pulmonary and Critical Care Medicine [facility] Pager: [phone]  09/14/2013, 9:14 AM

## 2013-09-15 LAB — GLUCOSE, CAPILLARY
GLUCOSE-CAPILLARY: 118 mg/dL — AB (ref 70–99)
Glucose-Capillary: 89 mg/dL (ref 70–99)
Glucose-Capillary: 151 mg/dL — ABNORMAL HIGH (ref 70–99)
Glucose-Capillary: 120 mg/dL — ABNORMAL HIGH (ref 70–99)

## 2013-09-15 NOTE — Progress Notes (Signed)
PULMONARY  / CRITICAL CARE MEDICINE  Name: [name] MRN: [id number] DOB: 31-May-1951    ADMISSION DATE:  09/07/2013  CHIEF COMPLAINT:  Shortness of breath.  BRIEF PATIENT DESCRIPTION: 63 year old female with OSA/OHS, chronic respiratory failure requiring nighttime mechanical ventilation via tracheostomy admitted 3/7 with hypercarbic respiratory failure likely secondary to inadequately treated obesity hypoventilation syndrome +/- URI.   SIGNIFICANT EVENTS / STUDIES:  3/7 Chest CTA  >>> No obvious pulmonary embolism, atelectasis, and no acute consolidation  LINES / TUBES: Chronic trach>>>  CULTURES: 3/7  Blood >>> 3/8  MRSA PCR >>> neg  ANTIBIOTICS:  INTERVAL HISTORY:  Nurse in room assisting w/ bedpan. Nurse and patient deny problems now on Tcollar/ PMV.   PHYSICAL EXAM  VITAL SIGNS: Temp:  [98.3 F (36.8 C)-98.8 F (37.1 C)] 98.3 F (36.8 C) (03/15 0800) Pulse Rate:  [39-87] 86 (03/15 0817) Resp:  [4-22] 21 (03/15 0817) BP: (127-177)/(64-82) 130/77 mmHg (03/15 0800) SpO2:  [95 %-100 %] 98 % (03/15 0817) FiO2 (%):  [28 %-30 %] 28 % (03/15 0817) Weight:  [283 lb 11.7 oz (128.7 kg)] 283 lb 11.7 oz (128.7 kg) (03/15 0808)  HEMODYNAMICS:   VENTILATOR SETTINGS: Vent Mode:  [-] PRVC FiO2 (%):  [28 %-30 %] 28 % Set Rate:  [12 bmp] 12 bmp Vt Set:  [360 mL] 360 mL PEEP:  [5 cmH20] 5 cmH20 Plateau Pressure:  [16 cmH20-18 cmH20] 18 cmH20  INTAKE / OUTPUT: Intake/Output     03/14 0701 - 03/15 0700 03/15 0701 - 03/16 0700   P.O.     Total Intake(mL/kg)     Urine (mL/kg/hr) 550 (0.2)    Total Output 550     Net -550          Urine Occurrence 3 x    Stool Occurrence 1 x      PHYSICAL EXAMINATION: General:  Comfortable Neuro:  Awake, alert, verbalizing w/ nurse HEENT:  Moist membranes Neck:  Tracheostomy intact Cardiovascular:  Distant heart sounds Lungs:  Clear, shallow. Encouraged deep breaths Abdomen:  Obese, not examined- on bedpan Musculoskeletal:  No  edema Skin:  Chronic changes LE BL  LABS: CBC  Recent Labs Lab 09/11/13 0520 09/13/13 0253 09/14/13 0400  WBC 6.6 5.9 6.6  HGB 10.2* 10.3* 10.9*  HCT 32.5* 32.3* 34.0*  PLT 195 197 195   Coag's No results found for this basename: APTT, INR,  in the last 168 hours BMET  Recent Labs Lab 09/11/13 0520 09/13/13 0253 09/14/13 0400  NA 142 142 141  K 4.7 4.2 4.4  CL 98 97 97  CO2 36* 33* 35*  BUN 18 13 12   CREATININE 0.84 0.80 0.82  GLUCOSE 103* 99 104*   Electrolytes  Recent Labs Lab 09/10/13 0255 09/11/13 0520 09/13/13 0253 09/14/13 0400  CALCIUM 9.3 9.4 9.5 9.5  MG 1.9  --   --   --   PHOS 4.2  --   --   --    Sepsis Markers No results found for this basename: LATICACIDVEN, PROCALCITON, O2SATVEN,  in the last 168 hours ABG  Recent Labs Lab 09/12/13 0635 09/13/13 0846  PHART 7.342* 7.336*  PCO2ART 70.1* 70.5*  PO2ART 67.5* 74.9*   Liver Enzymes No results found for this basename: AST, ALT, ALKPHOS, BILITOT, ALBUMIN,  in the last 168 hours Cardiac Enzymes  Recent Labs Lab 09/10/13 0255  PROBNP 421.3*   Glucose  Recent Labs Lab 09/14/13 0012 09/14/13 0418 09/14/13 0753 09/14/13 1239 09/14/13 1723 09/14/13 2218  GLUCAP 103* 105* 91 168* 103* 110*   IMAGING: No results found.  ASSESSMENT AND PLAN:  PULMONARY A: Acute on chronic hypercarbic respiratory failure OSA/OHS  Has hx "COPD" listed but highly doubt  Acute on chronic hypercarbia when off vent overnight P:  Goal SpO2>92 ATC during day time Mandatory nocturnal ventilation- continues Would require chronic vent facility Albuterol / Brovana / Pulmocort Stimulate to take deep breaths  CARDIOVASCULAR HTN  P:  Norvasc, Clonidine, Imdur, Zocor  RENAL A: Renal compensation with sCO2 35 No active issues P: Trend BMP  GASTROINTESTINAL A: Nutrition  GI Px P:  Carb modified diet Pepcid  HEMATOLOGIC A: Anemia of chronic disease  VTE Px P:  Trend  CBC Heparin  INFECTIOUS A: URI, likely viral P: No intervention required  ENDOCRINE A: DM P:  SSI   NEUROLOGIC A: Bipolar mood disorder P:  Depakote, Abilify, Buspar, Cymbalta    Waymon BudgeYOUNG,Hallee Mckenny D, MD Pulmonary and Critical Care Medicine [facility] Pager: [phone]  09/15/2013, 8:37 AM

## 2013-09-15 NOTE — Progress Notes (Signed)
Patient told RT that she does not want to go on the vent at this time but wants to go on it when she goes to sleep. RT will continue to monitor.

## 2013-09-15 NOTE — Progress Notes (Signed)
Pt taken off Vent and placed on 28% ATC.  Pt stable and tolerating well.  RT to continue to monitor.

## 2013-09-16 LAB — GLUCOSE, CAPILLARY
GLUCOSE-CAPILLARY: 96 mg/dL (ref 70–99)
Glucose-Capillary: 98 mg/dL (ref 70–99)

## 2013-09-16 NOTE — Progress Notes (Signed)
PULMONARY  / CRITICAL CARE MEDICINE  Name: [name] MRN: [id number] DOB: 10/09/1950    ADMISSION DATE:  09/07/2013  CHIEF COMPLAINT:  Shortness of breath.  BRIEF PATIENT DESCRIPTION: 63 year old female with OSA/OHS, chronic respiratory failure requiring nighttime mechanical ventilation via tracheostomy admitted 3/7 with hypercarbic respiratory failure likely secondary to inadequately treated obesity hypoventilation syndrome +/- URI.   SIGNIFICANT EVENTS / STUDIES:  3/7 Chest CTA  >>> No obvious pulmonary embolism, atelectasis, and no acute consolidation  LINES / TUBES: Chronic trach>>>  CULTURES: 3/7  Blood >>> 3/8  MRSA PCR >>> neg  ANTIBIOTICS:  INTERVAL HISTORY:  States that she is having trouble getting her air, and that she has a chronic chest pain that switches sides from left to right. No radiation.   PHYSICAL EXAM  VITAL SIGNS: Temp:  [98 F (36.7 C)-98.5 F (36.9 C)] 98.1 F (36.7 C) (03/16 1002) Pulse Rate:  [74-85] 85 (03/16 1159) Resp:  [17-24] 24 (03/16 1159) BP: (131-151)/(69-81) 146/72 mmHg (03/16 1159) SpO2:  [94 %-100 %] 97 % (03/16 1159) FiO2 (%):  [28 %-30 %] 28 % (03/16 1159) Weight:  [130.9 kg (288 lb 9.3 oz)] 130.9 kg (288 lb 9.3 oz) (03/16 0435)  VENTILATOR SETTINGS: Vent Mode:  [-] PRVC FiO2 (%):  [28 %-30 %] 28 % Set Rate:  [12 bmp] 12 bmp Vt Set:  [360 mL] 360 mL PEEP:  [5 cmH20] 5 cmH20 Plateau Pressure:  [16 cmH20-18 cmH20] 18 cmH20  INTAKE / OUTPUT: Intake/Output     03/15 0701 - 03/16 0700 03/16 0701 - 03/17 0700   Urine (mL/kg/hr) 500 (0.2) 250 (0.3)   Total Output 500 250   Net -500 -250        Urine Occurrence 1 x 2 x     PHYSICAL EXAMINATION: General:  Comfortable Neuro:  Awake, alert, verbalizing w/ nurse HEENT:  Moist membranes Neck:  Tracheostomy intact Cardiovascular:  Distant heart sounds Lungs:  Clear, shallow. Encouraged deep breaths Abdomen:  Obese, not examined- on bedpan Musculoskeletal:  No  edema Skin:  Chronic changes LE BL  LABS: CBC  Recent Labs Lab 09/11/13 0520 09/13/13 0253 09/14/13 0400  WBC 6.6 5.9 6.6  HGB 10.2* 10.3* 10.9*  HCT 32.5* 32.3* 34.0*  PLT 195 197 195   BMET  Recent Labs Lab 09/11/13 0520 09/13/13 0253 09/14/13 0400  NA 142 142 141  K 4.7 4.2 4.4  CL 98 97 97  CO2 36* 33* 35*  BUN 18 13 12   CREATININE 0.84 0.80 0.82  GLUCOSE 103* 99 104*   Electrolytes  Recent Labs Lab 09/10/13 0255 09/11/13 0520 09/13/13 0253 09/14/13 0400  CALCIUM 9.3 9.4 9.5 9.5  MG 1.9  --   --   --   PHOS 4.2  --   --   --    ABG  Recent Labs Lab 09/12/13 0635 09/13/13 0846  PHART 7.342* 7.336*  PCO2ART 70.1* 70.5*  PO2ART 67.5* 74.9*   Cardiac Enzymes  Recent Labs Lab 09/10/13 0255  PROBNP 421.3*   Glucose  Recent Labs Lab 09/15/13 0804 09/15/13 1150 09/15/13 1642 09/15/13 2136 09/16/13 0750 09/16/13 1312  GLUCAP 89 118* 151* 120* 96 98   IMAGING: No results found.  ASSESSMENT AND PLAN:  PULMONARY A: Acute on chronic hypercarbic respiratory failure 2nd to OSA/OHS. Reported hx of COPD. P:  Goal SpO2>92 ATC during day time Mandatory nocturnal ventilation- continues Would require chronic vent facility Albuterol / Brovana / Pulmocort Stimulate to take deep breaths  F/u CXR in am  CARDIOVASCULAR A: Hx HTN. P:  Norvasc, Clonidine, Imdur, Zocor  RENAL A:  No active issues P: Trend BMP  GASTROINTESTINAL A: Nutrition  GI Px P:  Carb modified diet Pepcid  HEMATOLOGIC A: Anemia of chronic disease  VTE Px P:  Trend CBC Heparin  INFECTIOUS A: URI, likely viral >> resolved. P: No intervention required  ENDOCRINE A: DM >> not needing insulin P:  D/c CBG checks  NEUROLOGIC A: Bipolar mood disorder P:  Depakote, Abilify, Buspar, Cymbalta   GLOBAL: Care management has referrals out to several long term trach facilities, and the pt has been accepted as far as insurance goes. Beds are not  currently available, however.   Joneen Roach, ACNP [place] [facility]/Critical Care Pager [phone] or [phone]  09/16/2013, 1:56 PM  Reviewed above, examined pt.  Awaiting placement.  Coralyn Helling, MD [facility] Pulmonary/Critical Care 09/16/2013, 2:51 PM Pager:  [phone] After 3pm call: [phone]

## 2013-09-16 NOTE — Progress Notes (Addendum)
I received a phone call from pt's daughter, asking to meet with me to answer questions about placement. I went to pt's room and spoke with pt's daughter and son. Daughter asked about options for vent SNF placement, and I explained that [facility] does not take Medicaid-only patients and that there are two other vent SNFs in [facility] [place] and [facility] [place] but that they have never taken a patient of mine, and I am not sure if they take Medicaid-only patients. [facility]. I left a voicemail on administrator's mobile and work phones for [facility] and [employer] ban[employer]re-mailed administrator with referral attached for [facility]. I also e-mailed the administrator with [facility] [place], [place] asking about the status of referral, which was sent to facility on Friday, March 13th. I also left a voicemail for Joya GaskinsEric Beasley, rep from [facility] in [place] to fill out a [place] application with family and to check on the status of referral.  Addendum: I received an e-mail back from administrator with [facility] in [place] stating clinicals are being reviewed now and facility will respond to bed request after review.  Addendum: I received a call back from Minerva Areolaric, who explained [facility] has 34 referrals pending, but that it is easy from the financial standpoint to switch her [place] to [facility] because she was living at a SNF prior to hospitalization. I e-mailed [facility]'s administrator to inform her that pt was at a SNF before hospitalization in the event this could expedite review and clinical approval. When pt is clinically approved, I will provide Eric's phone number to pt's daughter and son and will call Minerva Areolaric to provide daughter & son's phone numbers to arrange for [facility] application to be completed.  Addendum: I received an e-mail back from [facility], stating they currently have a waiting list for Medicaid  patients. Admissions holding on to referral and will call me if anything comes available.  Maryclare LabradorJulie Delshon Blanchfield, MSW, [facility] Clinical Social Worker [phone]

## 2013-09-16 NOTE — Progress Notes (Signed)
.   Respiratory therapist  in to place pt back on 28% TC per patient request.

## 2013-09-17 ENCOUNTER — Inpatient Hospital Stay (HOSPITAL_COMMUNITY): Payer: Medicaid Other

## 2013-09-17 NOTE — Progress Notes (Addendum)
RNCM and I met with pt and her daughter, and I explained that pt does not qualify for [facility] placement because she is not weanable. Pt's daughter states she thought [place] with admissions at [facility] says they take long-term vent patients; I explained that they might take patients who require long-term weaning, but that because it looks like pt will need the vent permanently, she does not qualify for [facility]. Pt is on the waiting list for [facility] and [facility], and I explained that this could mean weeks/years depending on how quickly beds become available. Daughter expressed understanding of slow turnover at vent SNFs. RNCM and I explained that it would be possible for daughter to take pt home on the vent, but she will need at least one other person to train on the vent with her. Daughter states she believes this will be possible, she just needs to make calls to get a team together. Daughter expressed understanding that she does not have much time to do this, as when a bed becomes available at [facility] pt will be medically ready for discharge. Daughter calling relatives/family friends now in an effort to find others to train. RNCM explained pt would likely have 24/7 help in the home for the first week with home health, then around 75 hours of help during the week. Home health agency is not certain this is the specific amount of time family would have help in the home, they have to check with insurance, but they are looking into this now. Daughter states she does have a room in her home that can accommodate pt, but she is not sure if the doorway is wide enough to fit the bariatric wheelchair; RNCM inquiring about dimensions of wheelchair from home health agency and will provide this information to daughter.  Addendum: I e-mailed [facility] in [place] to inquire about that status of referral and to ask if they train family members on trach/vent care in the event pt goes to the facility then family decides  to take her home.  Ky Barban, MSW, [facility] Clinical Social Worker [phone]

## 2013-09-17 NOTE — Progress Notes (Addendum)
I called [facility] admissions rep Misty StanleyStacey and spoke with her about Stacey's meeting with pt's brother and daughter yesterday. Misty StanleyStacey explained she gave pt's family a tour of their facility, but reiterated that there is a waiting list for [facility] facility. Kennyth ArnoldStacy states family leaning toward taking pt home, as placement options are limited. I [facility] the [facility] and she will add pt to their waiting list. [facility] called [facility] and administrator states they have referral and will hold onto it, but there are no beds open currently. One patient is scheduled for a weaning trail, and if pt weans and can leave a bed would become available. I asked administrator to hold onto referral, and administrator states she will. I will continue to call and follow-up with facility while pt is in our hospital. RNCM and I will talk with pt's family about taking pt home, and I will update family concerning current placement status: [facility], [facility] waiting list, [facility] reviewing, and [facility].  Addendum: I received a call from pt's daughter, explaining they toured [facility] yesterday and spoke with [place] in admissions. Daughter is under the impression from conversation with administrator that a bed could become available at [facility] on Friday and that pt would qualify for the bed because she has long-term care Medicaid. I explained that at this time, facilities have pt on their waiting list and they would not run insurance/complete medical assessment until a bed does become available--daughter is coming to the hospital this afternoon at 1:00pm, and I will meet with her to explain requirements for vent SNF. I called administrator after speaking with pt's daughter, and administrator explains she did not tell daughter pt could have a bed if one became available on Friday--instead, administrator explained [facility] is a short-term vent facility and that  they would be unable to take a patient if he/she is not weanable. As this nocturnal vent is predicted to be long-term, patient does not qualify for [facility]. I will explain this in meeting with daughter this afternoon.   Addendum: I sent a referral [facility] [place] [facility] [place] as this is the next-closest facility I am aware of and physician advisor recommended I make this referral. Rep from facility Milagros Reap(Mary Chester: [phone]) states she will send the referral for review and I am going to routinely check in with [facility] concerning bed availability.  Maryclare LabradorJulie Koralee Wedeking, MSW, [facility] Clinical Social Worker [phone]

## 2013-09-17 NOTE — Evaluation (Signed)
Physical Therapy Evaluation Patient Details Name: [name] MRN: [id number] DOB: [birth year] Today's Date: 09/17/2013 Time: 4098-1191 PT Time Calculation (min): 32 min  PT Assessment / Plan / Recommendation History of Present Illness  Pt presents with COPD exacerbation. Chronic trach on vent at night.    Clinical Impression  Pt admitted with above. Pt currently with functional limitations due to the deficits listed below (see PT Problem List). Pt did attempt to participate and will give a trial of PT.  Feel unsafe for transfers at current strength but in hopes that with therapy she could gain strength for pivot transfer with RW.   Pt will benefit from skilled PT to increase their independence and safety with mobility to allow discharge to the venue listed below.     PT Assessment  Patient needs continued PT services    Follow Up Recommendations  Home health PT;Supervision/Assistance - 24 hour (Only if pt has 2 family members at all times and HHPT/RN/aide)    Does the patient have the potential to tolerate intense rehabilitation      Barriers to Discharge   trying to figure out if feasible for pt to go home with family    Equipment Recommendations  Rolling walker with 5" wheels;Wheelchair (measurements PT);Wheelchair cushion (measurements PT);3in1 (PT);Hospital bed;Other (comment) (Hoyer lift, all equipment above must be bariatric for >282 lbs.)    Recommendations for Other Services     Frequency Min 2X/week    Precautions / Restrictions Precautions Precautions: Fall Restrictions Weight Bearing Restrictions: No   Pertinent Vitals/Pain VSS, No pain      Mobility  Bed Mobility Overal bed mobility: Needs Assistance;+2 for physical assistance Bed Mobility: Rolling;Supine to Sit Rolling: Min assist;Mod assist Supine to sit: Mod assist;+2 for physical assistance General bed mobility comments: Pt on bedpan on arrival.  Assisted pt off bedpan and cleaned pt.  Pt used rail  to roll bil directions.  Pt needed min assist to roll with rail use.  For supine to sit, pt used rail, needs incr assist for LEs and elevation of trunk.  Pt uses momentum and takes incr time to get to EOB.  Takes incr time to scoot to EOB as well but was able to scoot by weight shifting side to side.   Transfers Overall transfer level: Needs assistance Equipment used: Rolling walker (2 wheeled) Transfers: Sit to/from Stand Sit to Stand: Total assist;+2 physical assistance General transfer comment: Pt was able to clear bottom off bed by being allowed to pull up on RW.  Pt cannot extend trunk or knees fully making it unsafe to transfer with RW as knee instability bil.  Pt stood x2 with RW for 10 seconds each time.  Obtained Maxi sky pad and used it to get pt up once she was finished standing as pt still wanted to sit up.  TOo unsafe to transfer with RW.      Exercises General Exercises - Lower Extremity Ankle Circles/Pumps: AROM;Both;10 reps;Supine Long Arc Quad: AROM;Both;10 reps;Seated   PT Diagnosis: Generalized weakness  PT Problem List: Decreased balance;Decreased mobility;Decreased knowledge of use of DME;Decreased safety awareness;Decreased knowledge of precautions;Decreased strength;Decreased activity tolerance PT Treatment Interventions: DME instruction;Functional mobility training;Therapeutic activities;Therapeutic exercise;Balance training;Patient/family education;Wheelchair mobility training     PT Goals(Current goals can be found in the care plan section) Acute Rehab PT Goals Patient Stated Goal: to gohome PT Goal Formulation: With patient Time For Goal Achievement: 09/24/13 Potential to Achieve Goals: Good  Visit Information  Last PT Received On:  09/17/13 Assistance Needed: +3 or more (for transfer, +2 for bed mob and sitting balance.  ) History of Present Illness: Pt presents with COPD exacerbation. Chronic trach on vent at night.         Prior Functioning  Home  Living Family/patient expects to be discharged to:: Private residence Living Arrangements: Children Available Help at Discharge: Family;Available 24 hours/day (Children trying to arrange for home) Additional Comments: [facility] nurse to get information from daughter today re: her home status.   Prior Function Level of Independence: Needs assistance Gait / Transfers Assistance Needed: pt is mod assist for bed mobility but total assist of hoyer for OOB to WC does not ambulate ADL's / Homemaking Assistance Needed: total assist for all ADLs Communication Communication: No difficulties Dominant Hand: Right    Cognition  Cognition Arousal/Alertness: Awake/alert Behavior During Therapy: WFL for tasks assessed/performed Overall Cognitive Status: Within Functional Limits for tasks assessed    Extremity/Trunk Assessment Upper Extremity Assessment Upper Extremity Assessment: Defer to OT evaluation Lower Extremity Assessment Lower Extremity Assessment: RLE deficits/detail;LLE deficits/detail RLE Deficits / Details: grossly 2-/5 LLE Deficits / Details: grossly 2-/5 Cervical / Trunk Assessment Cervical / Trunk Assessment: Other exceptions Cervical / Trunk Exceptions: forward head and shoulders.   Balance Balance Overall balance assessment: Needs assistance Sitting-balance support: Bilateral upper extremity supported;Feet supported Sitting balance-Leahy Scale: Fair Sitting balance - Comments: Can sit EOB with supervision for up to 10 min.  Can reach at least 10 inches.  Standing balance support: Bilateral upper extremity supported;During functional activity Standing balance-Leahy Scale: Poor Standing balance comment: Pt requires bil UE support.  Pt able to stand for short time statically with RW with mod assist.    End of Session PT - End of Session Equipment Utilized During Treatment: Gait belt;Oxygen (trach) Activity Tolerance: Patient limited by fatigue Patient left: in chair;with call  bell/phone within reach Nurse Communication: Mobility status;Need for lift equipment  GP     INGOLD,Tanique Matney 09/17/2013, 11:52 AM Audree Camelawn Ingold,PT Acute Rehabilitation [phone] (pager)

## 2013-09-17 NOTE — Progress Notes (Signed)
PULMONARY  / CRITICAL CARE MEDICINE  Name: Alice DanielsCarolyn R [name] MRN: [id number] DOB: 03/19/1951    ADMISSION DATE:  09/07/2013  CHIEF COMPLAINT:  Shortness of breath.  BRIEF PATIENT DESCRIPTION: 63 year old female with OSA/OHS, chronic respiratory failure requiring nighttime mechanical ventilation via tracheostomy admitted 3/7 with hypercarbic respiratory failure likely secondary to inadequately treated obesity hypoventilation syndrome +/- URI.   SIGNIFICANT EVENTS / STUDIES:  3/7 Chest CTA  >>> No obvious pulmonary embolism, atelectasis, and no acute consolidation  LINES / TUBES: Chronic trach>>>  CULTURES: 3/7  Blood >>> neg 3/8  MRSA PCR >>> neg  ANTIBIOTICS:  INTERVAL HISTORY:   No complaints.  PHYSICAL EXAM  VITAL SIGNS: Temp:  [98 F (36.7 C)-98.8 F (37.1 C)] 98 F (36.7 C) (03/17 0435) Pulse Rate:  [72-90] 84 (03/17 0823) Resp:  [16-24] 16 (03/17 0335) BP: (102-158)/(44-94) 102/44 mmHg (03/17 0823) SpO2:  [94 %-100 %] 96 % (03/17 0823) FiO2 (%):  [28 %-30 %] 28 % (03/17 0823) Weight:  [282 lb 3 oz (128 kg)] 282 lb 3 oz (128 kg) (03/17 0435)  VENTILATOR SETTINGS: Vent Mode:  [-] PRVC FiO2 (%):  [28 %-30 %] 28 % Set Rate:  [12 bmp] 12 bmp Vt Set:  [360 mL] 360 mL PEEP:  [5 cmH20] 5 cmH20 Plateau Pressure:  [23 cmH20-24 cmH20] 23 cmH20  INTAKE / OUTPUT: Intake/Output     03/16 0701 - 03/17 0700 03/17 0701 - 03/18 0700   P.O. 240    Total Intake(mL/kg) 240 (1.9)    Urine (mL/kg/hr) 400 (0.1)    Total Output 400     Net -160          Urine Occurrence 4 x      PHYSICAL EXAMINATION: General:  Comfortable Neuro:  Awake, alert HEENT:  Trach site clean Cardiovascular:  Distant heart sounds Lungs: no wheeze Abdomen:  Obese  Musculoskeletal:  No edema Skin:  Chronic changes LE BL  LABS: CBC  Recent Labs Lab 09/11/13 0520 09/13/13 0253 09/14/13 0400  WBC 6.6 5.9 6.6  HGB 10.2* 10.3* 10.9*  HCT 32.5* 32.3* 34.0*  PLT 195 197 195   BMET  Recent  Labs Lab 09/11/13 0520 09/13/13 0253 09/14/13 0400  NA 142 142 141  K 4.7 4.2 4.4  CL 98 97 97  CO2 36* 33* 35*  BUN 18 13 12   CREATININE 0.84 0.80 0.82  GLUCOSE 103* 99 104*   Electrolytes  Recent Labs Lab 09/11/13 0520 09/13/13 0253 09/14/13 0400  CALCIUM 9.4 9.5 9.5   ABG  Recent Labs Lab 09/12/13 0635 09/13/13 0846  PHART 7.342* 7.336*  PCO2ART 70.1* 70.5*  PO2ART 67.5* 74.9*   Glucose  Recent Labs Lab 09/15/13 0804 09/15/13 1150 09/15/13 1642 09/15/13 2136 09/16/13 0750 09/16/13 1312  GLUCAP 89 118* 151* 120* 96 98   IMAGING: Dg Chest Port 1 View  09/17/2013   CLINICAL DATA:  Hypoxia.  Tracheostomy.  EXAM: PORTABLE CHEST - 1 VIEW  COMPARISON:  DG CHEST 1V PORT dated 09/09/2013; CT ANGIO CHEST W/CM &/OR WO/CM dated 09/07/2013  FINDINGS: Low lung volumes and enlarged cardiopericardial silhouette persist.  Tracheostomy tube projects over the tracheal air shadow, tip 5 cm above the carina.  Thoracic spondylosis. Borderline elevated right hemidiaphragm. Suspected atelectasis in the left lower lobe and potentially at the right lung base. The patient is rotated to the Right on today's radiograph, reducing diagnostic sensitivity and specificity.  IMPRESSION: 1. Very low lung volumes. 2. Mild cardiomegaly persists. 3. Mild atelectasis  in the lung bases.   Electronically Signed   By: [name] M.D.   On: 09/17/2013 08:04    ASSESSMENT AND PLAN:  A: Acute on chronic hypercarbic respiratory failure 2nd to OSA/OHS. Reported hx of COPD. P:  Goal SpO2>92 ATC during day time Mandatory nocturnal ventilation- continues Would require chronic vent facility Albuterol / Brovana / Pulmocort  A: Hx HTN. P:  Norvasc, Clonidine, Imdur, Zocor  A: Anemia of chronic disease  VTE Px P:  Trend CBC Heparin  A: Bipolar mood disorder P:  Depakote, Abilify, Buspar, Cymbalta  Medically stable for transfer to LTAC when bed available.  Coralyn Helling, MD [name]  Pulmonary/Critical Care 09/17/2013, 9:18 AM Pager:  [phone] After 3pm call: [phone]

## 2013-09-18 DIAGNOSIS — J9620 Acute and chronic respiratory failure, unspecified whether with hypoxia or hypercapnia: Secondary | ICD-10-CM

## 2013-09-18 NOTE — Progress Notes (Signed)
PULMONARY  / CRITICAL CARE MEDICINE  Name: [name] MRN: [id number] DOB: 03/21/1951    ADMISSION DATE:  09/07/2013  CHIEF COMPLAINT:  Shortness of breath.  BRIEF PATIENT DESCRIPTION: 63 year old female with OSA/OHS, chronic respiratory failure requiring nighttime mechanical ventilation via tracheostomy admitted 3/7 with hypercarbic respiratory failure likely secondary to inadequately treated obesity hypoventilation syndrome +/- URI.   SIGNIFICANT EVENTS / STUDIES:  3/7 Chest CTA  >>> No obvious pulmonary embolism, atelectasis, and no acute consolidation  LINES / TUBES: Chronic trach>>>  CULTURES: 3/7  Blood >>> neg 3/8  MRSA PCR >>> neg  ANTIBIOTICS: none  INTERVAL HISTORY:   No complaints.  PHYSICAL EXAM  VITAL SIGNS: Temp:  [97.8 F (36.6 C)-98.8 F (37.1 C)] 98.6 F (37 C) (03/18 0750) Pulse Rate:  [79-100] 82 (03/18 0750) Resp:  [18-32] 19 (03/18 0751) BP: (99-172)/(46-101) 167/80 mmHg (03/18 0941) SpO2:  [94 %-100 %] 95 % (03/18 0751) FiO2 (%):  [20 %-30 %] 28 % (03/18 0751) Weight:  [282 lb 3 oz (128 kg)] 282 lb 3 oz (128 kg) (03/18 0357)  VENTILATOR SETTINGS: Vent Mode:  [-] PRVC FiO2 (%):  [20 %-30 %] 28 % Set Rate:  [12 bmp] 12 bmp Vt Set:  [360 mL] 360 mL PEEP:  [5 cmH20] 5 cmH20 Plateau Pressure:  [23 cmH20-25 cmH20] 25 cmH20  INTAKE / OUTPUT: Intake/Output     03/17 0701 - 03/18 0700 03/18 0701 - 03/19 0700   P.O. 120    Total Intake(mL/kg) 120 (0.9)    Urine (mL/kg/hr)     Total Output       Net +120          Urine Occurrence 6 x    Stool Occurrence 3 x      PHYSICAL EXAMINATION: General:  Comfortable Neuro:  Awake, alert HEENT:  Trach site clean Cardiovascular:  Distant heart sounds Lungs: resps even non labored on ATC with PMV, no wheeze Abdomen:  Obese  Musculoskeletal:  No edema Skin:  Chronic changes LE BL  LABS: CBC  Recent Labs Lab 09/13/13 0253 09/14/13 0400  WBC 5.9 6.6  HGB 10.3* 10.9*  HCT 32.3* 34.0*  PLT  197 195   BMET  Recent Labs Lab 09/13/13 0253 09/14/13 0400  NA 142 141  K 4.2 4.4  CL 97 97  CO2 33* 35*  BUN 13 12  CREATININE 0.80 0.82  GLUCOSE 99 104*   Electrolytes  Recent Labs Lab 09/13/13 0253 09/14/13 0400  CALCIUM 9.5 9.5   ABG  Recent Labs Lab 09/12/13 0635 09/13/13 0846  PHART 7.342* 7.336*  PCO2ART 70.1* 70.5*  PO2ART 67.5* 74.9*   Glucose  Recent Labs Lab 09/15/13 0804 09/15/13 1150 09/15/13 1642 09/15/13 2136 09/16/13 0750 09/16/13 1312  GLUCAP 89 118* 151* 120* 96 98   IMAGING: Dg Chest Port 1 View  09/17/2013   CLINICAL DATA:  Hypoxia.  Tracheostomy.  EXAM: PORTABLE CHEST - 1 VIEW  COMPARISON:  DG CHEST 1V PORT dated 09/09/2013; CT ANGIO CHEST W/CM &/OR WO/CM dated 09/07/2013  FINDINGS: Low lung volumes and enlarged cardiopericardial silhouette persist.  Tracheostomy tube projects over the tracheal air shadow, tip 5 cm above the carina.  Thoracic spondylosis. Borderline elevated right hemidiaphragm. Suspected atelectasis in the left lower lobe and potentially at the right lung base. The patient is rotated to the Right on today's radiograph, reducing diagnostic sensitivity and specificity.  IMPRESSION: 1. Very low lung volumes. 2. Mild cardiomegaly persists. 3. Mild atelectasis in the  lung bases.   Electronically Signed   By: [name] M.D.   On: 09/17/2013 08:04    ASSESSMENT AND PLAN:  A: Acute on chronic hypercarbic respiratory failure 2nd to OSA/OHS. Reported hx of COPD. P:  Goal SpO2>92 ATC during day time Mandatory nocturnal ventilation Would require chronic vent facility v home with qhs vent - see below  Continue BD - Albuterol / Brovana / Pulmocort  A: Hx HTN. P:  Norvasc, Clonidine, Imdur, Zocor Consider increase clonidine if BP continues to trend up   A: Anemia of chronic disease  VTE Px P:  Trend CBC Heparin  A: Bipolar mood disorder P:  Depakote, Abilify, Buspar, Cymbalta  Dispo -- Medically stable for  transfer to LTAC v vent SNF v home with qhs vent when bed available. Social work/ Case Management following.    Danford Bad, NP 09/18/2013  9:55 AM Pager: [phone] or [phone]  Reviewed above, examined.  Stable for d/c once disposition arranged.  Continue current management.  Coralyn Helling, MD [facility] Pulmonary/Critical Care 09/18/2013, 11:14 AM Pager:  [phone] After 3pm call: [phone]

## 2013-09-18 NOTE — Progress Notes (Signed)
I received an e-mail from [facility] in [place] admissions stating pt has been clinically accepted. I gave them pt's daughter's phone number so Minerva Areolaric, the [facility] representative, can arrange a time to come to the hospital to [facility]. I also informed RNCM of pt's clinical acceptance to vent SNF in [place].   Maryclare LabradorJulie Janaysha Depaulo, MSW, [facility] Clinical Social Worker [phone]

## 2013-09-18 NOTE — Progress Notes (Addendum)
I e-mailed [facility] [place] asking about status of referral yesterday, and administrator responded that she has requested the facility DON expedite review for this patient as she is ready for discharge as soon as a bed becomes available. RNCM and I continue to provide support to pt and daughter, who is leaning toward taking pt home with home health after she can find additional family/family friends to train on vent/trach care for pt.  Addendum: I received a call from Milagros ReapMary Chester with [facility], and she states vent SNF [place] and [place] are full. She has added pt's referral to the waiting lists for both facilities and will call me if anything changes.  Maryclare LabradorJulie Rafeal Skibicki, MSW, [facility] Clinical Social Worker [phone]

## 2013-09-19 NOTE — Progress Notes (Signed)
PULMONARY  / CRITICAL CARE MEDICINE  Name: [name] MRN: [id number] DOB: 06/06/1951    ADMISSION DATE:  09/07/2013  CHIEF COMPLAINT:  Shortness of breath.  BRIEF PATIENT DESCRIPTION: 63 year old female with OSA/OHS, chronic respiratory failure requiring nighttime mechanical ventilation via tracheostomy admitted 3/7 with hypercarbic respiratory failure likely secondary to inadequately treated obesity hypoventilation syndrome +/- URI.   SIGNIFICANT EVENTS / STUDIES:  3/7 Chest CTA  >>> No obvious pulmonary embolism, atelectasis, and no acute consolidation  LINES / TUBES: Chronic trach>>>  CULTURES: 3/7  Blood >>> neg 3/8  MRSA PCR >>> neg  ANTIBIOTICS: none  INTERVAL HISTORY:   No complaints.  PHYSICAL EXAM  VITAL SIGNS: Temp:  [98.2 F (36.8 C)-98.6 F (37 C)] 98.5 F (36.9 C) (03/19 0348) Pulse Rate:  [61-108] 83 (03/19 0806) Resp:  [17-28] 28 (03/19 0806) BP: (135-143)/(59-85) 143/85 mmHg (03/19 0806) SpO2:  [95 %-98 %] 96 % (03/19 0806) FiO2 (%):  [28 %-30 %] 28 % (03/19 0806) Weight:  [284 lb 6.3 oz (129 kg)] 284 lb 6.3 oz (129 kg) (03/19 0348)  VENTILATOR SETTINGS: Vent Mode:  [-] PRVC FiO2 (%):  [28 %-30 %] 28 % Set Rate:  [12 bmp] 12 bmp Vt Set:  [360 mL] 360 mL PEEP:  [5 cmH20] 5 cmH20 Plateau Pressure:  [12 cmH20-23 cmH20] 12 cmH20  INTAKE / OUTPUT: Intake/Output     03/18 0701 - 03/19 0700 03/19 0701 - 03/20 0700   P.O.     Total Intake(mL/kg)     Urine (mL/kg/hr) 75 (0) 100 (0.2)   Total Output 75 100   Net -75 -100        Urine Occurrence 2 x      PHYSICAL EXAMINATION: General:  Comfortable Neuro:  Awake, alert HEENT:  Trach site clean Cardiovascular:  Distant heart sounds Lungs: resps even non labored on ATC with PMV, no wheeze Abdomen:  Obese  Musculoskeletal:  No edema Skin:  Chronic changes LE BL  LABS: CBC  Recent Labs Lab 09/13/13 0253 09/14/13 0400  WBC 5.9 6.6  HGB 10.3* 10.9*  HCT 32.3* 34.0*  PLT 197 195    BMET  Recent Labs Lab 09/13/13 0253 09/14/13 0400  NA 142 141  K 4.2 4.4  CL 97 97  CO2 33* 35*  BUN 13 12  CREATININE 0.80 0.82  GLUCOSE 99 104*   Electrolytes  Recent Labs Lab 09/13/13 0253 09/14/13 0400  CALCIUM 9.5 9.5   ABG  Recent Labs Lab 09/13/13 0846  PHART 7.336*  PCO2ART 70.5*  PO2ART 74.9*   Glucose  Recent Labs Lab 09/15/13 0804 09/15/13 1150 09/15/13 1642 09/15/13 2136 09/16/13 0750 09/16/13 1312  GLUCAP 89 118* 151* 120* 96 98   IMAGING: No results found.  ASSESSMENT AND PLAN:  A: Acute on chronic hypercarbic respiratory failure 2nd to OSA/OHS. Reported hx of COPD. P:  Goal SpO2>92 ATC during day time Mandatory nocturnal ventilation Continue BD - Albuterol / Brovana / Pulmocort  A: Hx HTN. P:  Norvasc, Clonidine, Imdur, Zocor  A: Anemia of chronic disease  VTE Px P:  Trend CBC weekly on Mondays Heparin  A: Bipolar mood disorder P:  Depakote, Abilify, Buspar, Cymbalta  She has placement in [place] >> Family applying for medicaid in [place].  Family would like to eventually take her home with vent if they can make arrangements.  [name], MD [facility] Pulmonary/Critical Care 09/19/2013, 11:35 AM Pager:  [phone] After 3pm call: [phone]

## 2013-09-19 NOTE — Progress Notes (Addendum)
Left a voicemail for [facility] Medicaid rep Minerva Areolaric to call me back letting me know if he was able to reach pt's daughter to arrange a time to [place].   Addendum: Spoke with pt's daughter, who states Minerva Areolaric is out of the office for a few days and won't be back to work until Monday. Daughter is going to call Minerva Areolaric today to schedule a time to meet with him here to fill out the [facility]. Daughter requests MD complete FMLA paperwork, which is on pt's chart with a sticky note requesting MD complete. I have informed daughter that paperwork is on pt's chart and will be completed when MD can do this. Daughter also asked if cell phone reception is good [place], [place] and I explained I believe it is because Minerva Areolaric calls me from the road on his cell, but I e-mailed administrator with [facility] explaining pt's daughter has [employer] and is inquiring about cell service. Also waiting for e-mail reply with hotel accomodation information for pt's family members when pts go to [place], as Production designer, theatre/television/filmadministrator believes they have a contract with local hotels that gives family members a discounted rate. Daughter relieved to hear that this facility also works on training family members to take pt home on the vent, as she is unable to find a second person to train on the vent at this time but believes that if pt goes to [place] she will have more time to form a team of people to help with home vent/trach care. I also e-mailed vent SNF in [place] and [place] inquiring about status of referral. [place] states there are two people on the waiting list: pt and one female. I provided pt's daughter Joan's phone number and made sure they have my phone number as well, requesting facility calls either of us if a bed becomes available and we could get pt to this facility. I will also continue to follow up with both SNFs concerning her status on the waiting lists and making sure pt's  daughter has contact information for the administrators at both facilities if pt is discharged to [place] and could be relocated to a [place] vent SNF if a bed opens for her.  Maryclare LabradorJulie Alaena Strader, MSW, [facility] Clinical Social Worker [phone]

## 2013-09-19 NOTE — Progress Notes (Signed)
Physical Therapy Treatment Patient Details Name: Alice DanielsCarolyn R Hardage MRN: [id number] DOB: 01/13/1951 Today's Date: 09/19/2013 Time: [id number] PT Time Calculation (min): 25 min  PT Assessment / Plan / Recommendation  History of Present Illness Pt presents with COPD exacerbation. Chronic trach on vent at night.     PT Comments   Pt admitted with above. Pt currently with functional limitations due to balance and endurance deficits.  Pt will benefit from skilled PT to increase their independence and safety with mobility to allow discharge to the venue listed below.    Follow Up Recommendations  Home health PT;Supervision/Assistance - 24 hour (Only if pt has 2 family members at all times and HHPT/RN- ultimately pt needs SNF.)     Does the patient have the potential to tolerate intense rehabilitation     Barriers to Discharge        Equipment Recommendations  Rolling walker with 5" wheels;Wheelchair (measurements PT);Wheelchair cushion (measurements PT);3in1 (PT);Hospital bed;Other (comment) (Hoyer lift, all equipment above must be bariatric for >282 l)    Recommendations for Other Services    Frequency Min 2X/week   Progress towards PT Goals Progress towards PT goals: Progressing toward goals  Plan Current plan remains appropriate    Precautions / Restrictions Precautions Precautions: Fall Restrictions Weight Bearing Restrictions: No   Pertinent Vitals/Pain VSS, No pain    Mobility  Bed Mobility Overal bed mobility: Needs Assistance;+2 for physical assistance Bed Mobility: Rolling;Supine to Sit Rolling: Min assist;Mod assist Supine to sit: Mod assist;+2 for physical assistance General bed mobility comments: For supine to sit, pt used rail, needs incr assist for LEs and elevation of trunk.  Pt uses momentum and takes incr time to get to EOB.  Takes incr time to scoot to EOB as well but was able to scoot by weight shifting side to side.   Transfers Overall transfer level: Needs  assistance Equipment used: Rolling walker (2 wheeled) Transfers: Sit to/from Stand Sit to Stand: Total assist;+2 physical assistance General transfer comment: Pt was able to clear bottom off bed by being allowed to pull up on RW.  Pt cannot extend trunk or knees fully making it unsafe to transfer with RW as knee instability bil.  Pt stood x2 with RW for 10 seconds the first time but the second time only 1 second and could not maintain stance.  After that, pt could not clear buttocks off bed due to ? fatigue.    Obtained Maxi sky pad and used it to get pt up once she was finished standing as pt still wanted to sit up.  TOo unsafe to transfer with RW.      Exercises General Exercises - Lower Extremity Ankle Circles/Pumps: AROM;Both;10 reps;Supine Long Arc Quad: AROM;Both;10 reps;Seated   PT Diagnosis:    PT Problem List:   PT Treatment Interventions:     PT Goals (current goals can now be found in the care plan section)    Visit Information  Last PT Received On: 09/19/13 Assistance Needed: +3 or more (for transfer, +2 for bed mob and sitting balance.  ) History of Present Illness: Pt presents with COPD exacerbation. Chronic trach on vent at night.      Subjective Data  Subjective: "I just can't do it today."   Cognition  Cognition Arousal/Alertness: Awake/alert Behavior During Therapy: WFL for tasks assessed/performed Overall Cognitive Status: Within Functional Limits for tasks assessed    Balance  Balance Overall balance assessment: Needs assistance;History of Falls Sitting-balance support: Bilateral upper extremity  supported;Feet supported Sitting balance-Leahy Scale: Fair Sitting balance - Comments: Can sit EOB with supervision for up to 10 min.  Can reach at least 10 inches.  Standing balance support: Bilateral upper extremity supported;During functional activity Standing balance-Leahy Scale: Poor Standing balance comment: Has to have RW and bil UE support and mod to max  assist.   End of Session PT - End of Session Equipment Utilized During Treatment: Gait belt;Oxygen (trach) Activity Tolerance: Patient limited by fatigue Patient left: in chair;with call bell/phone within reach Nurse Communication: Mobility status;Need for lift equipment   GP     INGOLD,Sri Clegg 09/19/2013, 1:51 PM  [facility] [phone] (pager)

## 2013-09-19 NOTE — Progress Notes (Signed)
Pt taken off vent and placed on ATC. Pt tolerating well. Cuff delfated and PMV placed on trach and pt able to talk.

## 2013-09-19 NOTE — Progress Notes (Signed)
Received an e-mail back with the name and contact info for the hotel [facility] [place] contracts with for pt's families: [facility], [address], [place], [place], [id number]. General Manager: Keenan BachelorFonda Wilson. Phone number: [phone]. Will provide this to pt's daughter.   Maryclare LabradorJulie Royal Beirne, MSW, [facility] Clinical Social Worker [phone]

## 2013-09-20 NOTE — Progress Notes (Signed)
PULMONARY  / CRITICAL CARE MEDICINE  Name: [name] MRN: [id number] DOB: 07/26/1950    ADMISSION DATE:  09/07/2013  CHIEF COMPLAINT:  Shortness of breath.  BRIEF PATIENT DESCRIPTION: 63 year old female with OSA/OHS, chronic respiratory failure requiring nighttime mechanical ventilation via tracheostomy admitted 3/7 with hypercarbic respiratory failure likely secondary to inadequately treated obesity hypoventilation syndrome +/- URI.   SIGNIFICANT EVENTS / STUDIES:  3/7 Chest CTA  >>> No obvious pulmonary embolism, atelectasis, and no acute consolidation  LINES / TUBES: Chronic trach>>>  CULTURES: 3/7  Blood >>> neg 3/8  MRSA PCR >>> neg  ANTIBIOTICS: none  INTERVAL HISTORY:   No complaints.  PHYSICAL EXAM  VITAL SIGNS: Temp:  [98 F (36.7 C)-98.3 F (36.8 C)] 98.1 F (36.7 C) (03/20 0801) Pulse Rate:  [39-109] 65 (03/20 0801) Resp:  [15-27] 15 (03/20 0801) BP: (112-167)/(57-103) 112/57 mmHg (03/20 0801) SpO2:  [92 %-100 %] 97 % (03/20 0801) FiO2 (%):  [28 %-40 %] 35 % (03/20 1134) Weight:  [284 lb 6.3 oz (129 kg)] 284 lb 6.3 oz (129 kg) (03/20 0352)  VENTILATOR SETTINGS: Vent Mode:  [-] PRVC FiO2 (%):  [28 %-40 %] 35 % Set Rate:  [12 bmp] 12 bmp Vt Set:  [360 mL] 360 mL PEEP:  [5 cmH20] 5 cmH20 Plateau Pressure:  [17 cmH20-24 cmH20] 23 cmH20  INTAKE / OUTPUT: Intake/Output     03/19 0701 - 03/20 0700 03/20 0701 - 03/21 0700   Urine (mL/kg/hr) 800 (0.3) 300 (0.5)   Total Output 800 300   Net -800 -300        Urine Occurrence 5 x    Stool Occurrence 2 x      PHYSICAL EXAMINATION: General:  Comfortable Neuro:  Awake, alert HEENT:  Trach site clean Cardiovascular:  Distant heart sounds Lungs: resps even non labored on ATC with PMV, no wheeze Abdomen:  Obese  Musculoskeletal:  No edema Skin:  Chronic changes LE BL  LABS: CBC  Recent Labs Lab 09/14/13 0400  WBC 6.6  HGB 10.9*  HCT 34.0*  PLT 195   BMET  Recent Labs Lab 09/14/13 0400   NA 141  K 4.4  CL 97  CO2 35*  BUN 12  CREATININE 0.82  GLUCOSE 104*   Electrolytes  Recent Labs Lab 09/14/13 0400  CALCIUM 9.5   Glucose  Recent Labs Lab 09/15/13 0804 09/15/13 1150 09/15/13 1642 09/15/13 2136 09/16/13 0750 09/16/13 1312  GLUCAP 89 118* 151* 120* 96 98   IMAGING: No results found.  ASSESSMENT AND PLAN:  A: Acute on chronic hypercarbic respiratory failure 2nd to OSA/OHS. Reported hx of COPD. P:  Goal SpO2>92 ATC during day time Mandatory nocturnal ventilation Continue BD - Albuterol / Brovana / Pulmocort  A: Hx HTN. P:  Norvasc, Clonidine, Imdur, Zocor  A: Anemia of chronic disease  VTE Px P:  Trend CBC weekly on Mondays Heparin  A: Bipolar mood disorder P:  Depakote, Abilify, Buspar, Cymbalta  [place] in [place] >> Family applying for medicaid in [place].  Family would like to eventually take her home with vent if they can make arrangements.  Medically stable for transfer when bed available.  Coralyn HellingVineet Yousif Edelson, MD [facility] Pulmonary/Critical Care 09/20/2013, 11:35 AM Pager:  [phone] After 3pm call: [phone]

## 2013-09-20 NOTE — Progress Notes (Addendum)
Had an extended conversation with pt's brother and pt concerning placement updates. Explained pt is on the waiting list for [facility] and [facility] in [place], and that [facility] does not take Medicaid only patients. Pt's brother states he believes pt is supposed to get Social Security benefits from a deceased family member, and he is following up on this. This can be a timely process (4-6 weeks before answer from DSS) and brother states he is going to DSS first thing on Monday after he is sure he has all the paperwork they are going to need to complete the application for Social Security for pt. I spoke with the brother about how this might change pt's chances of getting a bed in [place], but that we need to focus on [place] vent SNF as she has been clinically accepted there and the hospital will discharge her as soon as the [place] Medicaid application is completed and pt has a bed. I explained that if we lose a bed in [place], the hospital will have me make referrals to facilities in other states and we will have to send pt further from home. Brother states he will also call [facility] to make sure the [facility] application is completed ASAP. Minerva Areolaric has been off from work yesterday and today, so I explained he might not reach him but I encouraged pt's brother to leave him a voicemail to schedule the application completion. I e-mailed the administrator of the facility and asked if someone is covering for Minerva Areolaric while he is gone, and am waiting for a response. If someone is covering and the family can schedule a time to complete the application on or before Monday, I will give the contact info to pt's brother to do this ASAP.  I provided active listening and support to pt and brother as I gave them a thorough update concerning placement barriers and status. Pt expressed understanding of potentially needing to go to vent SNF in [place] for trach/vent care until a bed opens in [place] or until pt's daughter could take her home, if  either of these become options. Pt's brother assured pt that he would visit her regularly, and that her daughter would visit as well. Pt expressed understanding that she will do whatever it takes to continue getting vent/trach care, even if this means going to [place] before other options present themselves. I continue to provide support to pt and her family while she is in the hospital, and pt/brother thanked me for my assistance. I will follow up with Minerva AreolaEric on Monday if I have not heard back from administrator concerning person covering who can fill out Medicaid application before Minerva Areolaric is back from vacation.  Addendum: Received e-mail back from administrator at [facility] in [place] stating pt's brother or daughter will just have to leave Minerva Areolaric a voicemail to schedule Medicaid application first thing next week.  Alice LabradorJulie Dakoda [name], MSW, [facility] Clinical Social Worker [phone]

## 2013-09-21 MED ORDER — DICYCLOMINE HCL 10 MG PO CAPS
10.0000 mg | ORAL_CAPSULE | Freq: Four times a day (QID) | ORAL | Status: DC | PRN
  Filled 2013-09-21: qty 1

## 2013-09-21 MED ORDER — ALPRAZOLAM 0.5 MG PO TABS
0.5000 mg | ORAL_TABLET | Freq: Four times a day (QID) | ORAL | Status: DC | PRN
  Administered 2013-09-21 – 2013-09-23 (×4): 0.5 mg via ORAL
  Filled 2013-09-21 (×4): qty 1

## 2013-09-21 NOTE — Progress Notes (Signed)
PULMONARY  / CRITICAL CARE MEDICINE  Name: SHARMON CHERAMIE MRN: [id number] DOB: 1950/08/30    ADMISSION DATE:  09/07/2013  CHIEF COMPLAINT:  Shortness of breath.  BRIEF PATIENT DESCRIPTION: 63 year old female with OSA/OHS, chronic respiratory failure requiring nighttime mechanical ventilation via tracheostomy admitted 3/7 with hypercarbic respiratory failure likely secondary to inadequately treated obesity hypoventilation syndrome +/- URI.   SIGNIFICANT EVENTS / STUDIES:  3/7 Chest CTA  >>> No obvious pulmonary embolism, atelectasis, and no acute consolidation  LINES / TUBES: Chronic trach>>>  CULTURES: 3/7  Blood >>> neg 3/8  MRSA PCR >>> neg  ANTIBIOTICS:  none  INTERVAL HISTORY:   No complaints.  MEDS SCHED: . amLODipine  10 mg Oral Daily  . antiseptic oral rinse  15 mL Mouth Rinse QID  . arformoterol  15 mcg Nebulization BID  . ARIPiprazole  10 mg Oral Daily  . budesonide  0.5 mg Nebulization BID  . busPIRone  10 mg Oral BID  . chlorhexidine  15 mL Mouth Rinse BID  . cloNIDine  0.2 mg Oral BID  . divalproex  250 mg Oral BID  . DULoxetine  60 mg Oral BID  . famotidine  20 mg Oral Daily  . fluticasone  2 spray Each Nare Daily  . heparin  5,000 Units Subcutaneous 3 times per day  . isosorbide mononitrate  30 mg Oral Daily  . senna  2 tablet Oral Daily  . simethicone  80 mg Oral TID WC  . simvastatin  10 mg Oral QHS    PHYSICAL EXAM  VITAL SIGNS: Temp:  [98 F (36.7 C)-99.1 F (37.3 C)] 99.1 F (37.3 C) (03/21 0806) Pulse Rate:  [71-95] 90 (03/21 0845) Resp:  [15-26] 18 (03/21 0845) BP: (114-160)/(56-105) 159/89 mmHg (03/21 0938) SpO2:  [92 %-100 %] 97 % (03/21 0818) FiO2 (%):  [28 %-35 %] 28 % (03/21 0818)  VENTILATOR SETTINGS: Vent Mode:  [-] PRVC FiO2 (%):  [28 %-35 %] 28 % Set Rate:  [12 bmp] 12 bmp Vt Set:  [360 mL] 360 mL PEEP:  [5 cmH20] 5 cmH20 Plateau Pressure:  [18 cmH20-22 cmH20] 18 cmH20  INTAKE / OUTPUT: Intake/Output     03/20 0701 -  03/21 0700 03/21 0701 - 03/22 0700   P.O. 720    Total Intake(mL/kg) 720 (5.6)    Urine (mL/kg/hr) 1100 (0.4) 75 (0.1)   Total Output 1100 75   Net -380 -75        Urine Occurrence 6 x     PHYSICAL EXAMINATION: General:  Comfortable Neuro:  Awake, alert HEENT:  Trach site clean Cardiovascular:  Distant heart sounds Lungs: resps even non labored on ATC with PMV, no wheeze Abdomen:  Obese  Musculoskeletal:  No edema Skin:  Chronic changes LE BL  LABS: CBC CBC 3/14 showed Hg=10.9 BMET Chems 3/13 showed 141/ 4.4/ 97/ 35 w/ BUN=12 & Cr=0.82 Electrolytes No results found for this basename: CALCIUM, MG, PHOS,  in the last 168 hours Glucose  Recent Labs Lab 09/15/13 0804 09/15/13 1150 09/15/13 1642 09/15/13 2136 09/16/13 0750 09/16/13 1312  GLUCAP 89 118* 151* 120* 96 98   IMAGING: CXR 3/17 showed low lung vols, basilar atx, trache, obese...  ASSESSMENT AND PLAN:  A: Acute on chronic hypercarbic respiratory failure 2nd to OSA/OHS. Reported hx of COPD. P:  Goal SpO2>92 ATC during day time Mandatory nocturnal ventilation Continue BD - Albuterol / Brovana / Pulmocort  A: Hx HTN. P:  Norvasc, Clonidine, Imdur, Zocor  A: Anemia  of chronic disease  VTE Px P:  Trend CBC weekly on Mondays Heparin  A: Bipolar mood disorder P:  Depakote, Abilify, Buspar, Cymbalta  [place] in [place] >> Family applying for medicaid in [place].  Family would like to eventually take her home with vent if they can make arrangements.  Medically stable for transfer when bed available.   Lonzo CloudScott M. Kriste BasqueNadel, MD [facility] 09/21/2013, 11:11 AM [phone]

## 2013-09-21 NOTE — Progress Notes (Signed)
Nurse was called into pt room; pt c/o difficulty breathing, nurse witnessed accessory muscle use RR 30's.  Nurse removed passey muir valve, suctioned pt, pt remained in distress.  Nurse requested respiratory assistance.  Respiratory at bedside, pt placed back on vent.  Nurse contacted Elink  to make aware of event.  Nurse will continue to monitor.

## 2013-09-22 NOTE — Progress Notes (Signed)
PULMONARY  / CRITICAL CARE MEDICINE  Name: [name] MRN: [id number] DOB: 1951-06-03    ADMISSION DATE:  09/07/2013  CHIEF COMPLAINT:  Shortness of breath.  BRIEF PATIENT DESCRIPTION: 63 year old female with OSA/OHS, chronic respiratory failure requiring nighttime mechanical ventilation via tracheostomy admitted 3/7 with hypercarbic respiratory failure likely secondary to inadequately treated obesity hypoventilation syndrome +/- URI.   SIGNIFICANT EVENTS / STUDIES:  3/7 Chest CTA  >>> No obvious pulmonary embolism, atelectasis, and no acute consolidation  LINES / TUBES: Chronic trach>>>  CULTURES: 3/7  Blood >>> neg 3/8  MRSA PCR >>> neg  ANTIBIOTICS:  none  INTERVAL HISTORY:   No complaints.  MEDS SCHED: . amLODipine  10 mg Oral Daily  . antiseptic oral rinse  15 mL Mouth Rinse QID  . arformoterol  15 mcg Nebulization BID  . ARIPiprazole  10 mg Oral Daily  . budesonide  0.5 mg Nebulization BID  . busPIRone  10 mg Oral BID  . chlorhexidine  15 mL Mouth Rinse BID  . cloNIDine  0.2 mg Oral BID  . divalproex  250 mg Oral BID  . DULoxetine  60 mg Oral BID  . famotidine  20 mg Oral Daily  . fluticasone  2 spray Each Nare Daily  . heparin  5,000 Units Subcutaneous 3 times per day  . isosorbide mononitrate  30 mg Oral Daily  . senna  2 tablet Oral Daily  . simethicone  80 mg Oral TID WC  . simvastatin  10 mg Oral QHS    PHYSICAL EXAM  VITAL SIGNS: Temp:  [97.9 F (36.6 C)-98.6 F (37 C)] 98.1 F (36.7 C) (03/22 0807) Pulse Rate:  [71-96] 87 (03/22 1133) Resp:  [15-39] 26 (03/22 1133) BP: (123-153)/(60-78) 136/72 mmHg (03/22 1133) SpO2:  [93 %-97 %] 97 % (03/22 1133) FiO2 (%):  [28 %-30 %] 28 % (03/22 1133) Weight:  [130.6 kg (287 lb 14.7 oz)] 130.6 kg (287 lb 14.7 oz) (03/22 0452)  VENTILATOR SETTINGS: Vent Mode:  [-] PRVC FiO2 (%):  [28 %-30 %] 28 % Set Rate:  [12 bmp] 12 bmp Vt Set:  [360 mL] 360 mL PEEP:  [5 cmH20] 5 cmH20 Plateau Pressure:  [20  cmH20-24 cmH20] 21 cmH20  INTAKE / OUTPUT: Intake/Output     03/21 0701 - 03/22 0700 03/22 0701 - 03/23 0700   P.O. 120    Total Intake(mL/kg) 120 (0.9)    Urine (mL/kg/hr) 375 (0.1)    Total Output 375     Net -255          Urine Occurrence 1 x    Stool Occurrence 1 x     PHYSICAL EXAMINATION: General:  Comfortable Neuro:  Awake, alert HEENT:  Trach site clean Cardiovascular:  Distant heart sounds Lungs: resps even non labored on ATC with PMV, no wheeze Abdomen:  Obese  Musculoskeletal:  No edema Skin:  Chronic changes LE BL  LABS: CBC CBC 3/14 showed Hg=10.9 BMET Chems 3/13 showed 141/ 4.4/ 97/ 35 w/ BUN=12 & Cr=0.82 Electrolytes No results found for this basename: CALCIUM, MG, PHOS,  in the last 168 hours Glucose  Recent Labs Lab 09/15/13 1642 09/15/13 2136 09/16/13 0750 09/16/13 1312  GLUCAP 151* 120* 96 98   IMAGING: CXR 3/17 showed low lung vols, basilar atx, trache, obese...  ASSESSMENT AND PLAN:  A: Acute on chronic hypercarbic respiratory failure 2nd to OSA/OHS. Reported hx of COPD. P:  Goal SpO2>92 ATC during day time Mandatory nocturnal ventilation Continue BD -  Albuterol / Rosalyn GessBrovana / Pulmocort  A: Hx HTN. P:  Norvasc, Clonidine, Imdur, Zocor  A: Anemia of chronic disease  VTE Px P:  Trend CBC weekly on Mondays Heparin  A: Bipolar mood disorder P:  Depakote, Abilify, Buspar, Cymbalta  [place] >> Family applying for medicaid in [place].  Family would like to eventually take her home with vent if they can make arrangements.  Medically stable for transfer when bed available.  LABS & CXR requested for Mon 3/23...   Lonzo CloudScott M. Kriste BasqueNadel, MD [facility] 09/22/2013, 11:58 AM [phone]

## 2013-09-23 ENCOUNTER — Inpatient Hospital Stay (HOSPITAL_COMMUNITY): Payer: Medicaid Other

## 2013-09-23 LAB — COMPREHENSIVE METABOLIC PANEL
ALT: 10 U/L (ref 0–35)
AST: 16 U/L (ref 0–37)
Albumin: 3.2 g/dL — ABNORMAL LOW (ref 3.5–5.2)
Alkaline Phosphatase: 56 U/L (ref 39–117)
BUN: 17 mg/dL (ref 6–23)
CALCIUM: 9.6 mg/dL (ref 8.4–10.5)
CO2: 33 mEq/L — ABNORMAL HIGH (ref 19–32)
Chloride: 96 mEq/L (ref 96–112)
Creatinine, Ser: 0.91 mg/dL (ref 0.50–1.10)
GFR calc non Af Amer: 65 mL/min — ABNORMAL LOW (ref >90)
GFR, EST AFRICAN AMERICAN: 76 mL/min — AB (ref >90)
Glucose, Bld: 106 mg/dL — ABNORMAL HIGH (ref 70–99)
Potassium: 4.6 mEq/L (ref 3.7–5.3)
Sodium: 140 mEq/L (ref 137–147)
Total Bilirubin: <0.2 mg/dL — ABNORMAL LOW (ref 0.3–1.2)
Total Protein: 6.8 g/dL (ref 6.0–8.3)

## 2013-09-23 LAB — CBC WITH DIFFERENTIAL/PLATELET
BASOS ABS: 0 10*3/uL (ref 0.0–0.1)
Basophils Relative: 0 % (ref 0–1)
Eosinophils Absolute: 0.2 10*3/uL (ref 0.0–0.7)
Eosinophils Relative: 3 % (ref 0–5)
HEMATOCRIT: 33.7 % — AB (ref 36.0–46.0)
Hemoglobin: 10.6 g/dL — ABNORMAL LOW (ref 12.0–15.0)
LYMPHS ABS: 2.7 10*3/uL (ref 0.7–4.0)
Lymphocytes Relative: 33 % (ref 12–46)
MCH: 26.8 pg (ref 26.0–34.0)
MCHC: 31.5 g/dL (ref 30.0–36.0)
MCV: 85.3 fL (ref 78.0–100.0)
MONO ABS: 0.5 10*3/uL (ref 0.1–1.0)
Monocytes Relative: 6 % (ref 3–12)
Neutro Abs: 4.7 10*3/uL (ref 1.7–7.7)
Neutrophils Relative %: 58 % (ref 43–77)
Platelets: ADEQUATE 10*3/uL (ref 150–400)
RBC: 3.95 MIL/uL (ref 3.87–5.11)
RDW: 15.5 % (ref 11.5–15.5)
WBC: 8.1 10*3/uL (ref 4.0–10.5)

## 2013-09-23 LAB — TSH: TSH: 2.779 u[IU]/mL (ref 0.350–4.500)

## 2013-09-23 NOTE — Progress Notes (Signed)
Placed pt on vent at this time due to sat of 85%.  Patient tolerating well, sat 99%. RT will continue to monitor.

## 2013-09-23 NOTE — Progress Notes (Signed)
PULMONARY  / CRITICAL CARE MEDICINE  Name: [name] MRN: [id number] DOB: 03/26/1951    ADMISSION DATE:  09/07/2013  CHIEF COMPLAINT:  Shortness of breath.  BRIEF PATIENT DESCRIPTION: 63 year old female with OSA/OHS, chronic respiratory failure requiring nighttime mechanical ventilation via tracheostomy admitted 3/7 with hypercarbic respiratory failure likely secondary to inadequately treated obesity hypoventilation syndrome +/- URI.   SIGNIFICANT EVENTS / STUDIES:  3/7 Chest CTA  >>> No obvious pulmonary embolism, atelectasis, and no acute consolidation  LINES / TUBES: Chronic trach>>>  CULTURES: 3/7  Blood >>> neg 3/8  MRSA PCR >>> neg  ANTIBIOTICS:  none  INTERVAL HISTORY:   No complaints.  MEDS SCHED: . amLODipine  10 mg Oral Daily  . antiseptic oral rinse  15 mL Mouth Rinse QID  . arformoterol  15 mcg Nebulization BID  . ARIPiprazole  10 mg Oral Daily  . budesonide  0.5 mg Nebulization BID  . busPIRone  10 mg Oral BID  . chlorhexidine  15 mL Mouth Rinse BID  . cloNIDine  0.2 mg Oral BID  . divalproex  250 mg Oral BID  . DULoxetine  60 mg Oral BID  . famotidine  20 mg Oral Daily  . fluticasone  2 spray Each Nare Daily  . heparin  5,000 Units Subcutaneous 3 times per day  . isosorbide mononitrate  30 mg Oral Daily  . senna  2 tablet Oral Daily  . simethicone  80 mg Oral TID WC  . simvastatin  10 mg Oral QHS    PHYSICAL EXAM  VITAL SIGNS: Temp:  [97.9 F (36.6 C)-98.5 F (36.9 C)] 98.4 F (36.9 C) (03/23 7829) Pulse Rate:  [75-98] 86 (03/23 1149) Resp:  [19-29] 22 (03/23 1149) BP: (116-148)/(47-86) 134/66 mmHg (03/23 0914) SpO2:  [92 %-97 %] 95 % (03/23 1149) FiO2 (%):  [28 %-30 %] 28 % (03/23 1149) Weight:  [290 lb 12.6 oz (131.9 kg)] 290 lb 12.6 oz (131.9 kg) (03/23 0430)  VENTILATOR SETTINGS: Vent Mode:  [-] PRVC FiO2 (%):  [28 %-30 %] 28 % Set Rate:  [12 bmp] 12 bmp Vt Set:  [360 mL] 360 mL PEEP:  [5 cmH20] 5 cmH20 Plateau Pressure:  [20  cmH20-22 cmH20] 22 cmH20  INTAKE / OUTPUT: Intake/Output     03/22 0701 - 03/23 0700 03/23 0701 - 03/24 0700   P.O. 600 240   Total Intake(mL/kg) 600 (4.5) 240 (1.8)   Urine (mL/kg/hr) 1400 (0.4) 300 (0.5)   Total Output 1400 300   Net -800 -60        Stool Occurrence 3 x     PHYSICAL EXAMINATION: General:  Comfortable Neuro:  Awake, alert HEENT:  Trach site clean Cardiovascular:  Distant heart sounds Lungs: resps even non labored on ATC with PMV, no wheeze Abdomen:  Obese  Musculoskeletal:  No edema Skin:  Chronic changes LE BL  LABS: CBC CBC 3/14 showed Hg=10.9 BMET Chems 3/13 showed 141/ 4.4/ 97/ 35 w/ BUN=12 & Cr=0.82 Electrolytes  Recent Labs Lab 09/23/13 0321  CALCIUM 9.6   Glucose  Recent Labs Lab 09/16/13 1312  GLUCAP 98   IMAGING: CXR 3/17 showed low lung vols, basilar atx, trache, obese...  ASSESSMENT AND PLAN:  A: Acute on chronic hypercarbic respiratory failure 2nd to OSA/OHS. Reported hx of COPD. P:  - Goal SpO2>92 - ATC during day time - Mandatory nocturnal ventilation - Continue BD - Albuterol / Brovana / Pulmocort - Needs vent SNF which is currently in the works. -  Met with daughter today and all questions were answered.  A: Hx HTN. P:  - Norvasc, Clonidine, Imdur, Zocor, continue.  A: Anemia of chronic disease  VTE Px P:  - Trend CBC weekly on Mondays - Heparin  A: Bipolar mood disorder P:  - Depakote, Abilify, Buspar, Cymbalta  She has placement in [place] in [place] >> Family applying for medicaid in [place].  Family would like to eventually take her home with vent if they can make arrangements.  Medically stable for transfer when bed available.  FMLA papers filled.  LABS & CXR noted.  Rush Farmer, M.D. [facility] Pulmonary/Critical Care Medicine. Pager: [phone]. After hours pager: [phone].

## 2013-09-23 NOTE — Progress Notes (Addendum)
[facility] [place] has offered pt a bed. I have scheduled PTAR for 2pm tomorrow and informed MD that pt can discharge tomorrow, requesting discharge summary for tomorrow. MD states this will be completed tomorrow. MD signed FL2, and I have started discharge packet placing FL2 inside. Informed RNCM that pt got bed offer from [facility], and I will go inform pt now. Left voicemails for pt's brother and daughter informing them of bed offer from [facility]. Pt discharging tomorrow afternoon.  Addendum: Spoke with pt's son, who is happy to hear about bed offer in [place] and got the contact information for the facility. States he will Naval architectcall administrator tomorrow and will tour the facility. Pt [facility] to hear about [place] [facility] [place] [facility], stating she far prefers this to go to [place]. I told her transportation has been scheduled for tomorrow afternoon, and pt states she will also share the news with her daughter, who will be back at the hospital this evening.  Maryclare LabradorJulie Airlie Blumenberg, MSW, [facility] Clinical Social Worker [phone]

## 2013-09-23 NOTE — Progress Notes (Addendum)
Called Medicaid rep Minerva AreolaEric with [facility] [place]. Minerva Areolaric states he has completed Medicaid application for pt to go to vent SNF in [place], he is going to e-mail the app to pt's daughter Aurea GraffJoan and she just needs to approve the application and if there are any changes that need to be made, Minerva Areolaric will make these then forward the application to his financial team and I will receive a call from admissions with facility when transportation can officially be arranged. Based on this information, I believe we could be looking at discharge before the end of this week.  Addendum: Received a call from admissions with [facility] [place] asking about the status of a referral I made back in February. That particular patient is back in the hospital, but is on a 35% trach and is not utilizing the vent. I asked if the facility would be able to accept Ms. Newlon for placement, as this facility would be preferable for pt/family based on geographic location. Facility reviewing now and will call me back.  Addendum: I e-mailed updated clincials to admissions with [facility] asking they review her information and informing them that if they could offer a bed we could have pt transferred to their facility as soon as tomorrow.   Addendum: Spoke extensively with pt's daughter Aurea GraffJoan this morning. Aurea GraffJoan explained her FMLA paperwork has not been completed, and she had a question for the MD concerning pt's lung functioning. I caught MD on unit and had him complete and sign FMLA paperwork and answer her question. Aurea GraffJoan explained she has the [facility] and will look through it and send it back with any changes or approve it and we can move forward with placement. Aurea GraffJoan states she has an appointment with DSS on 04/23 to fill out Medicare application for pt, and she is getting all of the paperwork in place now. I provided the phone number/address/manager's contact information for the hotel in [place] that  contracts with the [facility] for discounted rates for pts' family members. Aurea GraffJoan asked me to fax the FMLA paperwork for her, and I did and gave her the original paperwork back for her records. Aurea GraffJoan understanding that at this time I am waiting for response from [facility] and as soon as I have the go-ahead from the facility, pt will discharge. This could be any day now, by the end of the week at the latest. I will update pt/family as soon as facility contacts me telling me to arrange transport.   Maryclare LabradorJulie Sye Schroepfer, MSW, [facility] Clinical Social Worker [phone]

## 2013-09-23 NOTE — Progress Notes (Signed)
Physical Therapy Treatment Patient Details Name: Turner DanielsCarolyn R Roebuck MRN: [id number] DOB: 04/11/1951 Today's Date: 09/23/2013 Time: [id number] PT Time Calculation (min): 26 min  PT Assessment / Plan / Recommendation  History of Present Illness Pt presents with COPD exacerbation. Chronic trach on vent at night.     PT Comments   Pt progressing with all mobility today able to stand and pivot to chair. Pt does fatigue with increased time in standing and with pivot only able to get to edge of chair, required seated rest before second attempt to stand to adjust pad and max assist +2 to scoot back in chair. Pt encouraged to continue HEP and that will need lift for return to bed with staff. Pt with noted shaking today which she reports is not baseline.   Follow Up Recommendations  Supervision/Assistance - 24 hour;SNF     Does the patient have the potential to tolerate intense rehabilitation     Barriers to Discharge        Equipment Recommendations  Rolling walker with 5" wheels;Wheelchair (measurements PT);Wheelchair cushion (measurements PT);3in1 (PT);Hospital bed;Other (comment)    Recommendations for Other Services    Frequency     Progress towards PT Goals Progress towards PT goals: Progressing toward goals  Plan Current plan remains appropriate    Precautions / Restrictions Precautions Precautions: Fall   Pertinent Vitals/Pain HR 93 sats 96% on 38% trach collar No pain    Mobility  Bed Mobility Overal bed mobility: Needs Assistance;+2 for physical assistance Bed Mobility: Sidelying to Sit Rolling: Min assist Sidelying to sit: Mod assist General bed mobility comments: cues for sequence to roll with assist of rail and pad to roll Right then assist to elevate trunk from surface. Pt able to scoot to EOB with increased time and reciprocal scooting Transfers Overall transfer level: Needs assistance Equipment used: Rolling walker (2 wheeled) Transfers: Sit to/from Stand Sit to  Stand: Max assist;+2 physical assistance General transfer comment: Pt able to clear buttock from surface with increased time and assist for anterior translation. Pt able to stand with trunk flexion and use of wide RW with RW stabilized to assist with standing. Pt able to pivot with RW bed to recliner today taking 4-5 pivotal steps with walker. Stood again from chair with 2 person assist to adjust pad but difficulty standing from chair and unable to achieve fully upright    Exercises General Exercises - Lower Extremity Long Arc Quad: AROM;Both;Seated;15 reps Hip Flexion/Marching: AROM;Seated;Both;15 reps Toe Raises: AROM;Seated;Both;15 reps   PT Diagnosis:    PT Problem List:   PT Treatment Interventions:     PT Goals (current goals can now be found in the care plan section)    Visit Information  Last PT Received On: 09/23/13 Assistance Needed: +2 History of Present Illness: Pt presents with COPD exacerbation. Chronic trach on vent at night.      Subjective Data      Cognition  Cognition Arousal/Alertness: Awake/alert Behavior During Therapy: WFL for tasks assessed/performed Overall Cognitive Status: Within Functional Limits for tasks assessed    Balance     End of Session PT - End of Session Equipment Utilized During Treatment: Gait belt;Oxygen Activity Tolerance: Patient limited by fatigue Patient left: in chair;with call bell/phone within reach Nurse Communication: Mobility status;Need for lift equipment   GP     Delorse Lekabor, Evanna Washinton Beth 09/23/2013, 10:26 AM Delaney MeigsMaija Tabor Karolyn Messing, PT [phone]

## 2013-09-24 MED ORDER — ALPRAZOLAM 0.5 MG PO TABS: 0.5000 mg | ORAL_TABLET | Freq: Four times a day (QID) | ORAL | Status: AC | PRN

## 2013-09-24 MED ORDER — BUDESONIDE 0.5 MG/2ML IN SUSP: 0.5000 mg | Freq: Two times a day (BID) | RESPIRATORY_TRACT | Status: AC

## 2013-09-24 NOTE — Progress Notes (Addendum)
Called MD to request discharge summary before 1:30pm, as I have scheduled transport for 2pm. Invited MD to call me back if this will not be possible and I will need to push transport time back. Have also paged MD to relay this message and will follow up when MD returns call/page.  Addendum: Discharge packet placed on pt's chart; RN informed of discharge, pickup scheduled for 2pm with PTAR. Called administrator at [facility] letting her know I e-mailed the discharge summary and inviting her to call me if she has other questions/paperwork needs. Checked in with pt, who states she is eager to go.  Alice LabradorJulie Ferdinand [name], MSW, [facility]WA Clinical Social Worker [phone]

## 2013-09-24 NOTE — Discharge Summary (Signed)
Physician Discharge Summary  Patient ID: ALEISHA PAONE MRN: [id number] DOB/AGE: [birth year] 64 y.o.  Admit date: 09/07/2013 Discharge date: 09/24/2013  Problem List Principal Problem:   Respiratory failure, acute and chronic Active Problems:   Morbid obesity   BIPOLAR DISORDER UNSPECIFIED   OBSTRUCTIVE SLEEP APNEA   Essential hypertension, benign   Anemia of other chronic disease   Type II or unspecified type diabetes mellitus with neurological manifestations, not stated as uncontrolled(250.60)   Tracheostomy in place   Obesity hypoventilation syndrome   Hyperlipidemia   Respiratory failure  HPI: Ms. Tercero is a 63 year old female with a chart diagnosis of COPD even though she never smoked, chronic respiratory failure, OSA/OHS and a recent admission for Pseudomonas pneumonia who was transferred from her skilled nursing facility 3/8 for hypoxemia. The shortness of breath had been building for a little while. There were no associated cough, fever, chills, wheezing, nausea, vomiting, or diarrhea. She had a sore throat recently. She felt short of breath as she did when she arrived with the recent bout of Pseudomonas pneumonia. She sleeps on the ventilator at night; she does not currently know her pressure settings. She was admitted to [facility] for acute on chronic respiratory failure secondary to OHS.   Hospital Course:   Initially she required significant ventilatory support in order to be comfortable, but was able to wean rather quickly. By 3/10 she was using trach collar during the day and received mandatory nocturnal ventilation. She was tried on PRN nocturnal support, but it was found that this caused her to retain CO2, so it was determined that she will likely need full support nocturnally. Her hospital course was rather uneventful after that point. Once it was determined that she would require long term placement and a trach facility the CSWs attempted to find her placement 3/13. This  proved difficult as she was unable to find a bed for an LTACH. Eventually she no longer met the requirements for LTACH so the focus of long term care placement had to be restarted for Vent SNF placement. 3/23 she was notified that there was a bed available at [facility] in [facility] and is scheduled for discharge to there today.    Labs at discharge Lab Results  Component Value Date   CREATININE 0.91 09/23/2013   BUN 17 09/23/2013   NA 140 09/23/2013   K 4.6 09/23/2013   CL 96 09/23/2013   CO2 33* 09/23/2013   Lab Results  Component Value Date   WBC 8.1 09/23/2013   HGB 10.6* 09/23/2013   HCT 33.7* 09/23/2013   MCV 85.3 09/23/2013   PLT PLATELET CLUMPS NOTED ON SMEAR, COUNT APPEARS ADEQUATE 09/23/2013   Lab Results  Component Value Date   ALT 10 09/23/2013   AST 16 09/23/2013   ALKPHOS 56 09/23/2013   BILITOT <0.2* 09/23/2013   Lab Results  Component Value Date   INR 0.95 06/01/2013   INR 1.01 05/03/2013   INR 0.93 04/03/2013   Discharge Exam: General: Comfortable  Neuro: Awake, alert  HEENT: Trach site clean  Cardiovascular: Distant heart sounds  Lungs: resps even non labored on ATC with PMV, no wheeze  Abdomen: Obese  Musculoskeletal: No edema  Skin: Chronic changes LE BL  Current radiology studies Dg Chest Port 1 View  09/23/2013   CLINICAL DATA:  Short of breath, infiltrates  EXAM: PORTABLE CHEST - 1 VIEW  COMPARISON:  Prior chest x-ray 09/17/2013  FINDINGS: Stable cardiomegaly. Inspiratory volumes remain a  very low. The tracheostomy tube tip is midline at the level of the clavicles in satisfactory position. Slightly increased pulmonary vascular congestion. No overt edema. Elevation of the right hemidiaphragm is similar compared to prior. No pneumothorax. No acute osseous abnormality.  IMPRESSION: 1. Slightly increased pulmonary vascular congestion without evidence of overt edema. 2. Inspiratory volumes remain very low and there is persistent elevation of the  right hemidiaphragm. 3. Stable cardiomegaly.   Electronically Signed   By: Jacqulynn Cadet M.D.   On: 09/23/2013 07:53    Disposition:  03-Skilled Nursing Facility  A/P:  A:  Acute on chronic hypercarbic respiratory failure 2nd to OSA/OHS.  Reported hx of COPD.  Hx HTN.  Anemia of chronic disease Bipolar mood disorder   P:  - Goal SpO2>92  - ATC during day time  - Mandatory nocturnal ventilation        - Inpatient vent settings PRVC/Vt 360/Rate 12/PEEP 5/FiO2 30%/ iTime 0.8 - Continue BD - Albuterol / Brovana / Pulmocort  - Norvasc, Clonidine, Imdur, Zocor, continue.  - Depakote, Abilify, Buspar, Cymbalta       Discharge Orders   Future Orders Complete By Expires   Call MD for:  difficulty breathing, headache or visual disturbances  As directed    Call MD for:  extreme fatigue  As directed    Call MD for:  persistant nausea and vomiting  As directed    Call MD for:  severe uncontrolled pain  As directed    Call MD for:  temperature >100.4  As directed    Diet - low sodium heart healthy  As directed    Increase activity slowly  As directed        Medication List    STOP taking these medications       budesonide-formoterol 160-4.5 MCG/ACT inhaler  Commonly known as:  SYMBICORT     imipenem-cilastatin 500 MG injection  Commonly known as:  PRIMAXIN     LORazepam 0.5 MG tablet  Commonly known as:  ATIVAN     tiotropium 18 MCG inhalation capsule  Commonly known as:  SPIRIVA      TAKE these medications       albuterol (2.5 MG/3ML) 0.083% nebulizer solution  Commonly known as:  PROVENTIL  Take 2.5 mg by nebulization every 6 (six) hours as needed for wheezing or shortness of breath.     ALPRAZolam 0.5 MG tablet  Commonly known as:  XANAX  Take 1 tablet (0.5 mg total) by mouth every 6 (six) hours as needed for anxiety.     amLODipine 10 MG tablet  Commonly known as:  NORVASC  Take 10 mg by mouth daily.     arformoterol 15 MCG/2ML Nebu  Commonly known as:   BROVANA  Take 15 mcg by nebulization 2 (two) times daily.     ARIPiprazole 10 MG tablet  Commonly known as:  ABILIFY  Take 10 mg by mouth daily. For depressive psychosis     ascorbic acid 500 MG tablet  Commonly known as:  VITAMIN C  Take 500 mg by mouth 2 (two) times daily.     benzonatate 200 MG capsule  Commonly known as:  TESSALON  Take 200 mg by mouth 3 (three) times daily as needed for cough.     budesonide 0.5 MG/2ML nebulizer solution  Commonly known as:  PULMICORT  Take 2 mLs (0.5 mg total) by nebulization 2 (two) times daily.     busPIRone 10 MG tablet  Commonly known  as:  BUSPAR  Take 10 mg by mouth 2 (two) times daily.     cloNIDine 0.2 MG tablet  Commonly known as:  CATAPRES  Take 0.2 mg by mouth 2 (two) times daily.     divalproex 250 MG 24 hr tablet  Commonly known as:  DEPAKOTE ER  Take 250 mg by mouth 2 (two) times daily.     DULoxetine 60 MG capsule  Commonly known as:  CYMBALTA  Take 60 mg by mouth 2 (two) times daily.     famotidine 20 MG tablet  Commonly known as:  PEPCID  Take 20 mg by mouth daily.     fluticasone 50 MCG/ACT nasal spray  Commonly known as:  FLONASE  Place 2 sprays into the nose daily.     isosorbide mononitrate 30 MG 24 hr tablet  Commonly known as:  IMDUR  Take 30 mg by mouth daily.     nitroGLYCERIN 0.4 MG SL tablet  Commonly known as:  NITROSTAT  Place 0.4 mg under the tongue every 5 (five) minutes as needed for chest pain.     senna 8.6 MG Tabs tablet  Commonly known as:  SENOKOT  Take 2 tablets by mouth daily.     simethicone 80 MG chewable tablet  Commonly known as:  MYLICON  Chew 80 mg by mouth every 8 (eight) hours.     simvastatin 10 MG tablet  Commonly known as:  ZOCOR  Take 10 mg by mouth at bedtime.     vitamin D (CHOLECALCIFEROL) 400 UNITS tablet  Take 400 Units by mouth daily.        Discharged Condition: fair  Time spent on discharge greater than 40 minutes.  Vital signs at  Discharge. Temp:  [97.8 F (36.6 C)-98.3 F (36.8 C)] 98.2 F (36.8 C) (03/24 0339) Pulse Rate:  [70-86] 70 (03/24 0341) Resp:  [20-26] 20 (03/24 0341) BP: (94-135)/(51-120) 119/59 mmHg (03/24 0339) SpO2:  [86 %-99 %] 98 % (03/24 0831) FiO2 (%):  [28 %-30 %] 28 % (03/24 0831) Weight:  [132 kg (291 lb 0.1 oz)] 132 kg (291 lb 0.1 oz) (03/24 0339)  Office follow up Special Information or instructions.  Signed: Georgann Housekeeper, ACNP [facility] Care Pager [phone] or [phone]  Patient stable for d/c to vent SNF, met with patient and family yesterday and confirmed wishes and answered all questions.  Further instructions as above.  Patient seen and examined, agree with above note.  I dictated the care and orders written for this patient under my direction.  Rush Farmer, MD [phone]

## 2013-09-24 NOTE — Progress Notes (Signed)
Report called to Steward DroneBrenda at [facility]. For transport to facility at 2 pm today. No c/o voiced.

## 2013-11-01 DEATH — deceased

## 2013-11-13 IMAGING — CT CT HEAD W/O CM
1 of 3 series · 10 of 30 positions shown, 13 images · non-contrast
Comparison: 10/15/2011.

CLINICAL DATA: 62-year-old female with acute encephalopathy.
Altered mental status.

CT HEAD WITHOUT CONTRAST
TECHNIQUE: Contiguous axial images were obtained from the base of
the skull through the vertex without contrast.

[Series 2: head trauma 4.8 h37s · axial · 0.43mm/px · z∈[-140,-1]mm · 10 of 36 slices shown, 13 images]
[im 4/36  brain]
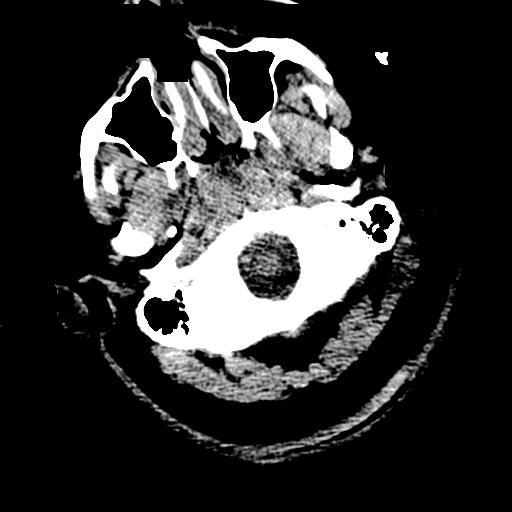
[im 4/36  bone]
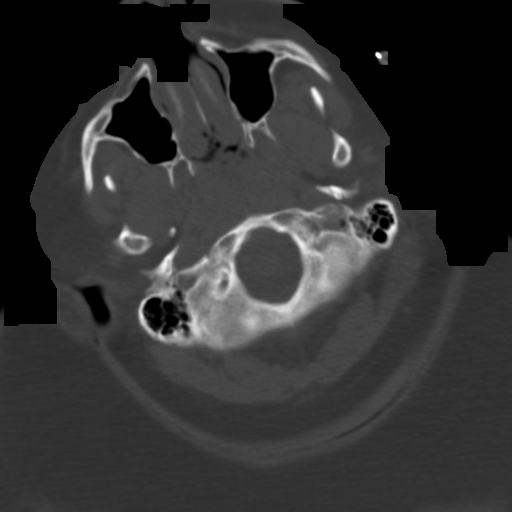
[im 7/36  brain]
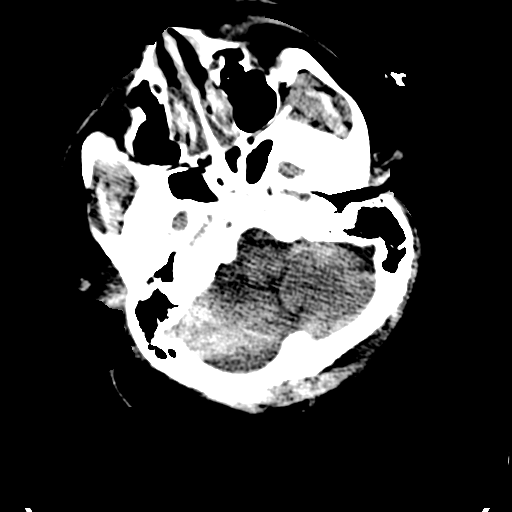
[im 10/36  brain]
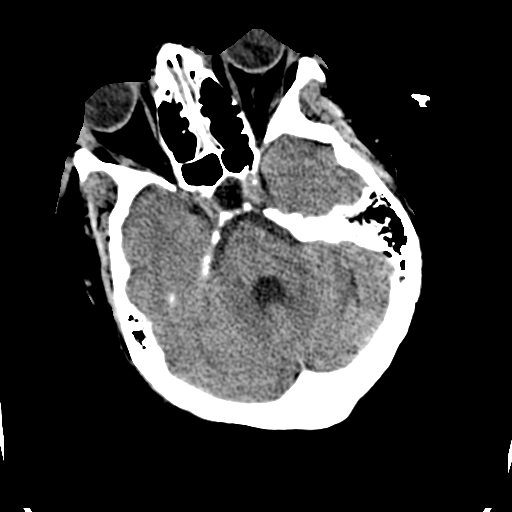
[im 13/36  brain]
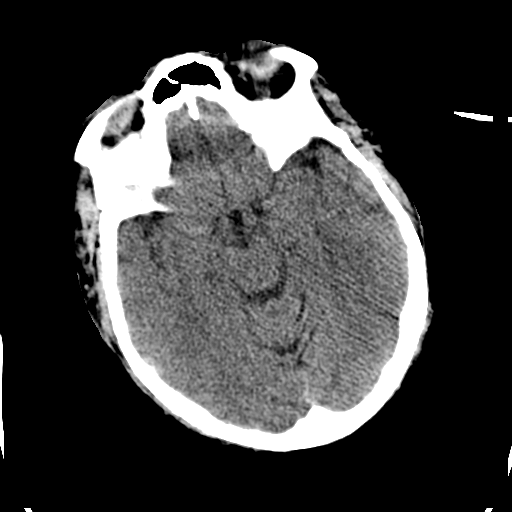
[im 16/36  brain]
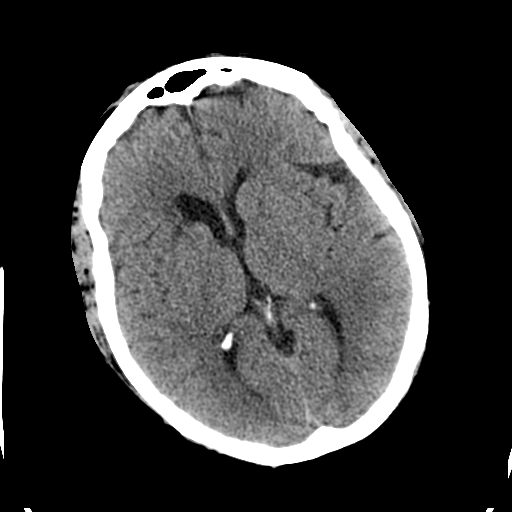
[im 16/36  bone]
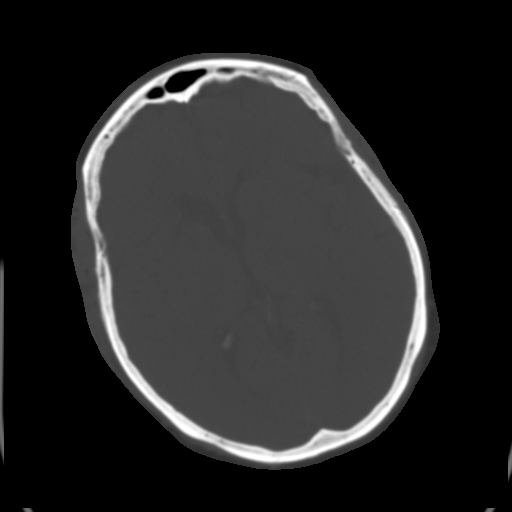
[im 20/36  brain]
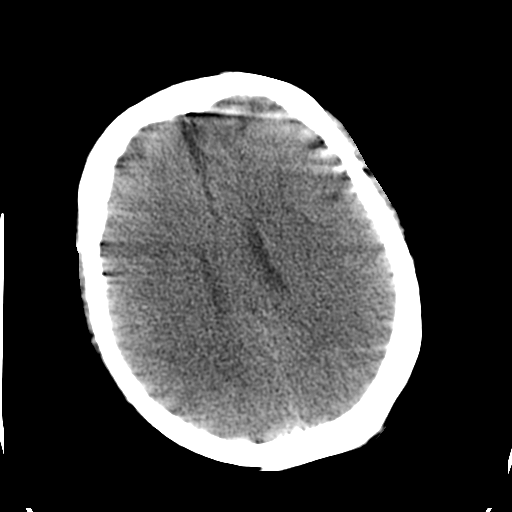
[im 23/36  brain]
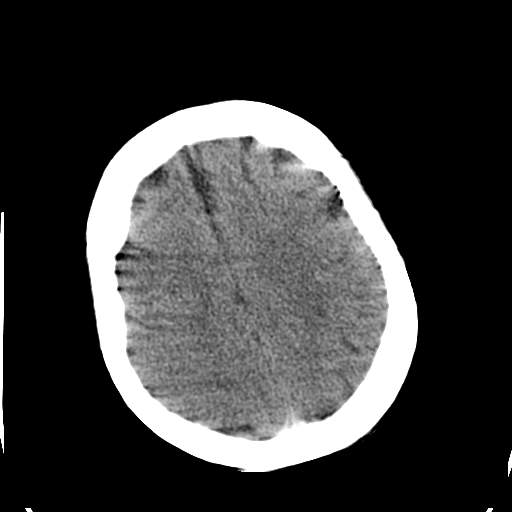
[im 26/36  brain]
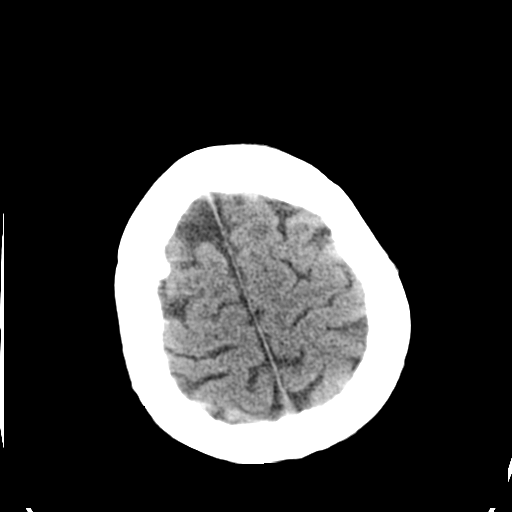
[im 29/36  brain]
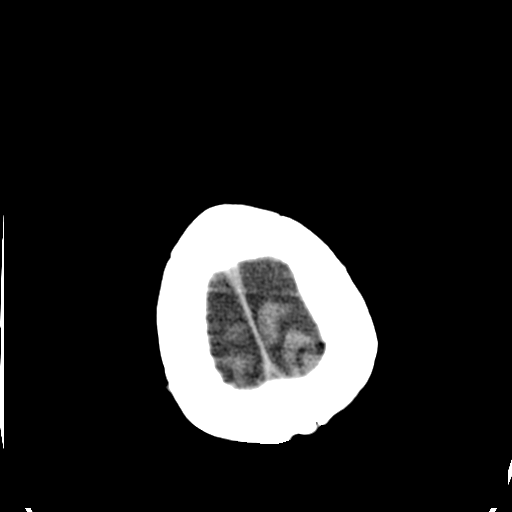
[im 29/36  bone]
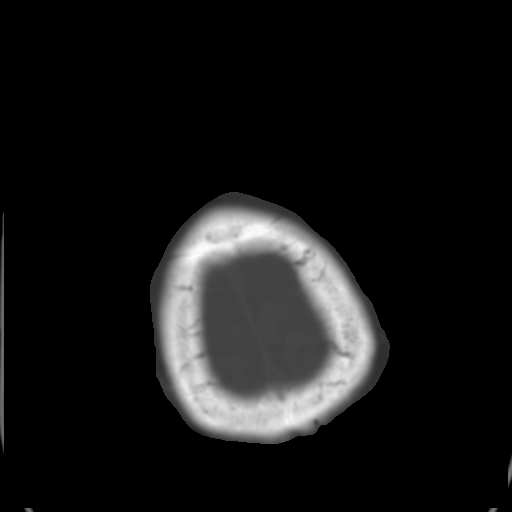
[im 32/36  brain]
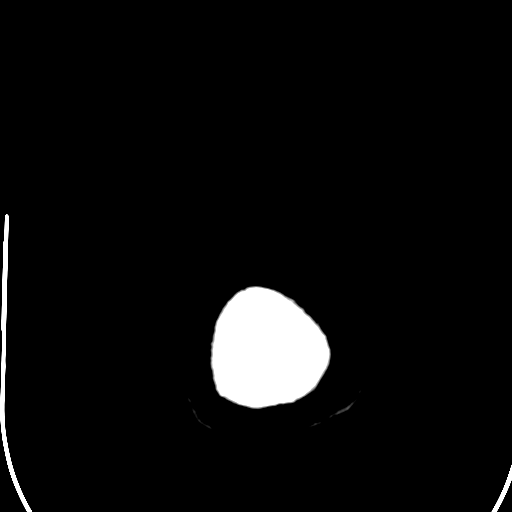

[10 of 30 positions shown; findings below may reference images not displayed]

FINDINGS: Large body habitus.  No acute scalp or orbit soft tissue
findings.  Retained secretions in the nasopharynx. Visualized
paranasal sinuses and mastoids are clear.  No acute osseous
abnormality identified.

Chronic lacunar infarct right caudate nucleus is stable.  Patchy
cerebral white matter hypodensity is not significantly changed.
Cerebral volume remains normal.  No ventriculomegaly. No midline
shift, mass effect, or evidence of mass lesion.  Chronic partially
empty sella suspected. No acute intracranial hemorrhage identified.
No evidence of cortically based acute infarction identified.  No
suspicious intracranial vascular hyperdensity.
IMPRESSION: No acute intracranial abnormality.  Stable appearance of chronic
small vessel disease.

## 2013-11-13 IMAGING — CR DG CHEST 1V PORT
1 series · 1 of 1 positions shown · non-contrast
Comparison: 09/01/2011; 09/16/2010

CLINICAL DATA: Respiratory distress, choked on candy, decreased
level of consciousness

PORTABLE CHEST - 1 VIEW

[AP]
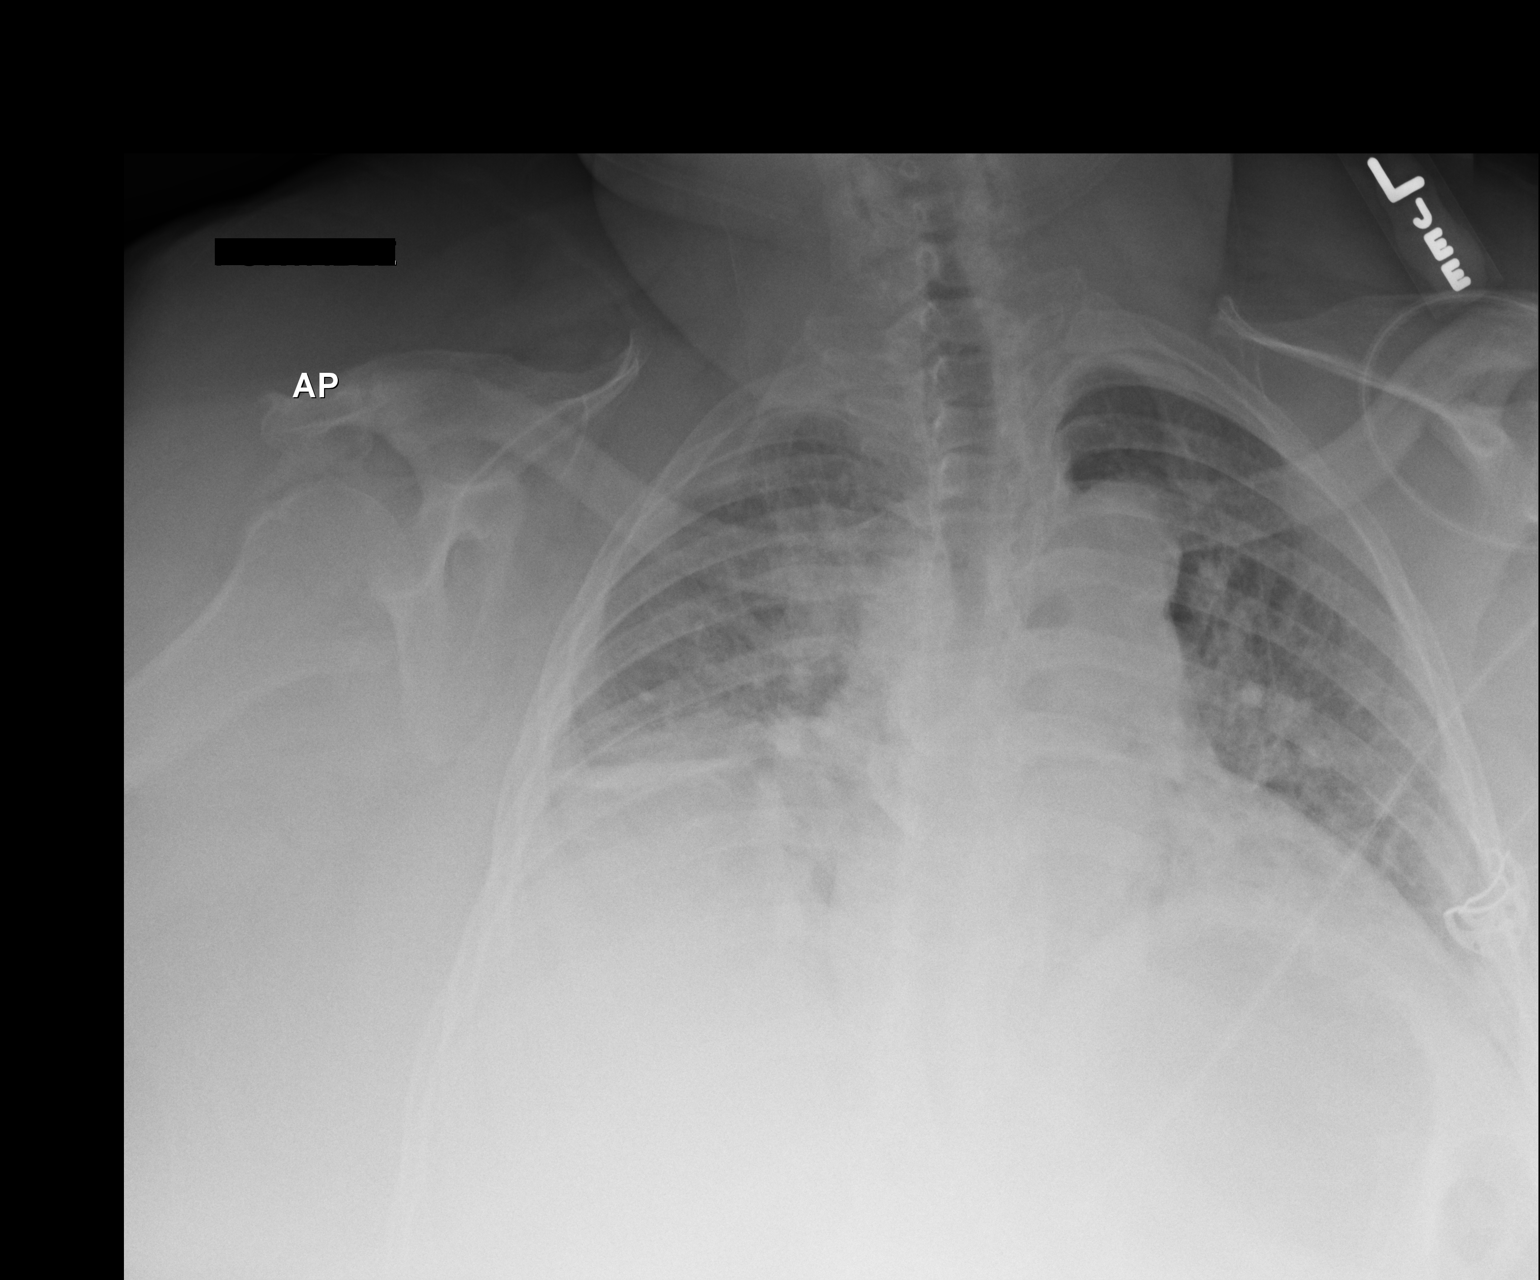

[1 of 1 positions shown; findings below may reference images not displayed]

FINDINGS: The examination is degraded secondary to portable technique and
patient body habitus.

Grossly unchanged enlarged cardiac silhouette and mediastinal
contours.  The pulmonary vasculature is less distinct on the
present examination with cephalization of flow.  There is chronic
mild elevation of the right hemidiaphragm.  Worsening bibasilar
heterogeneous opacities.  Suspected trace bilateral effusions.  No
definite pneumothorax.  Unchanged bones.
IMPRESSION: Degraded examination with findings suggestive of pulmonary edema
and bibasilar opacities, right greater than left, atelectasis
versus infiltrate.

## 2013-11-13 IMAGING — CR DG CHEST 1V PORT
1 series · 1 of 1 positions shown · non-contrast
Comparison: Earlier same day; 09/01/2011

CLINICAL DATA: Evaluate endotracheal tube

PORTABLE CHEST - 1 VIEW

[AP]
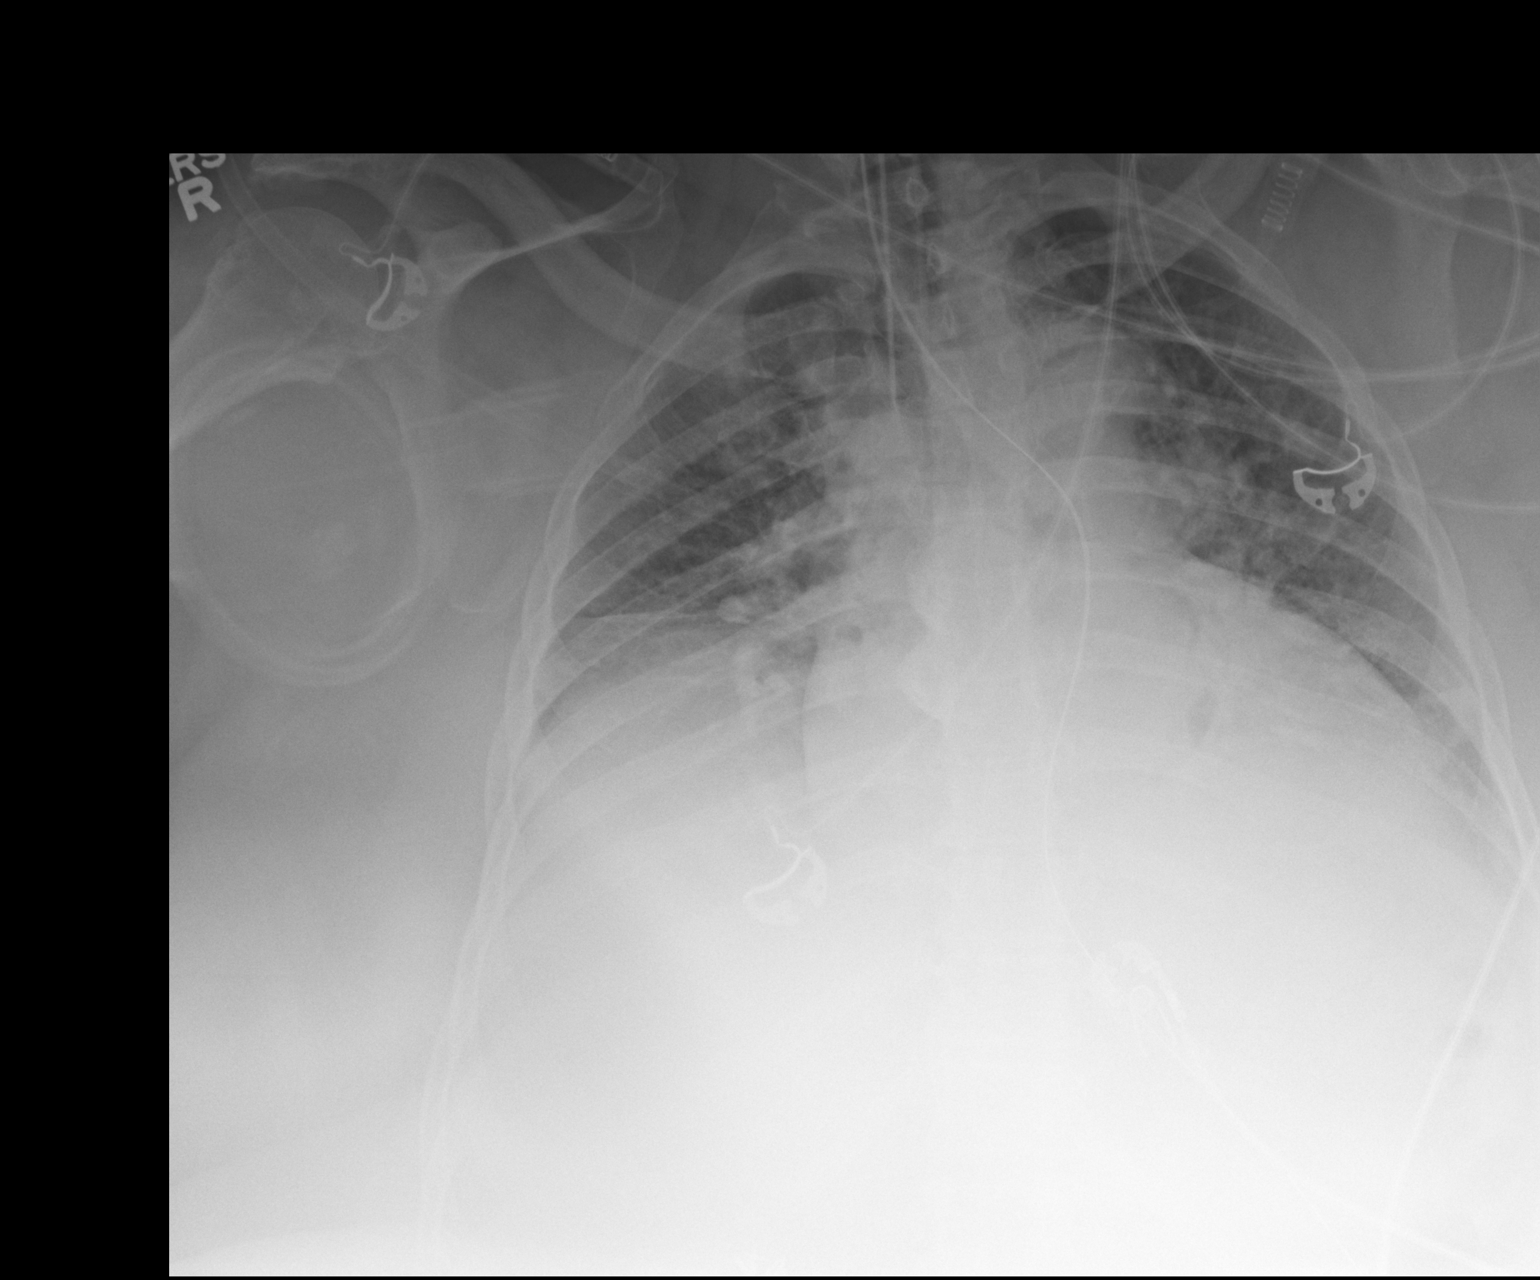

[1 of 1 positions shown; findings below may reference images not displayed]

FINDINGS: Grossly unchanged enlarged cardiac silhouette and mediastinal
contours.  Interval intubation with endotracheal tube overlying the
tracheal air column, tip approximately 2.9 cm above the carina.
Interval placement of enteric tube with tip and side port
projecting below the left hemidiaphragm.

Pulmonary vasculature remains indistinct with cephalization of
flow.  Grossly unchanged bilateral mid and lower lung heterogeneous
air space opacities, right likely greater than left.  Grossly
unchanged small bilateral effusions.  Unchanged bones.
IMPRESSION: 1.  Appropriately positioned support apparatus as above.  No
pneumothorax.
2.  Persistent finding suggestive of pulmonary edema and bilateral
heterogeneous air space opacities, atelectasis versus infiltrate.

3. Suspected small bilateral effusions.

## 2013-11-14 IMAGING — CR DG CHEST 1V PORT
1 series · 1 of 1 positions shown · non-contrast
Comparison: 11/05/2012 and earlier.

CLINICAL DATA: 62-year-old female intubated.  Shortness of breath.

PORTABLE CHEST - 1 VIEW

[AP]
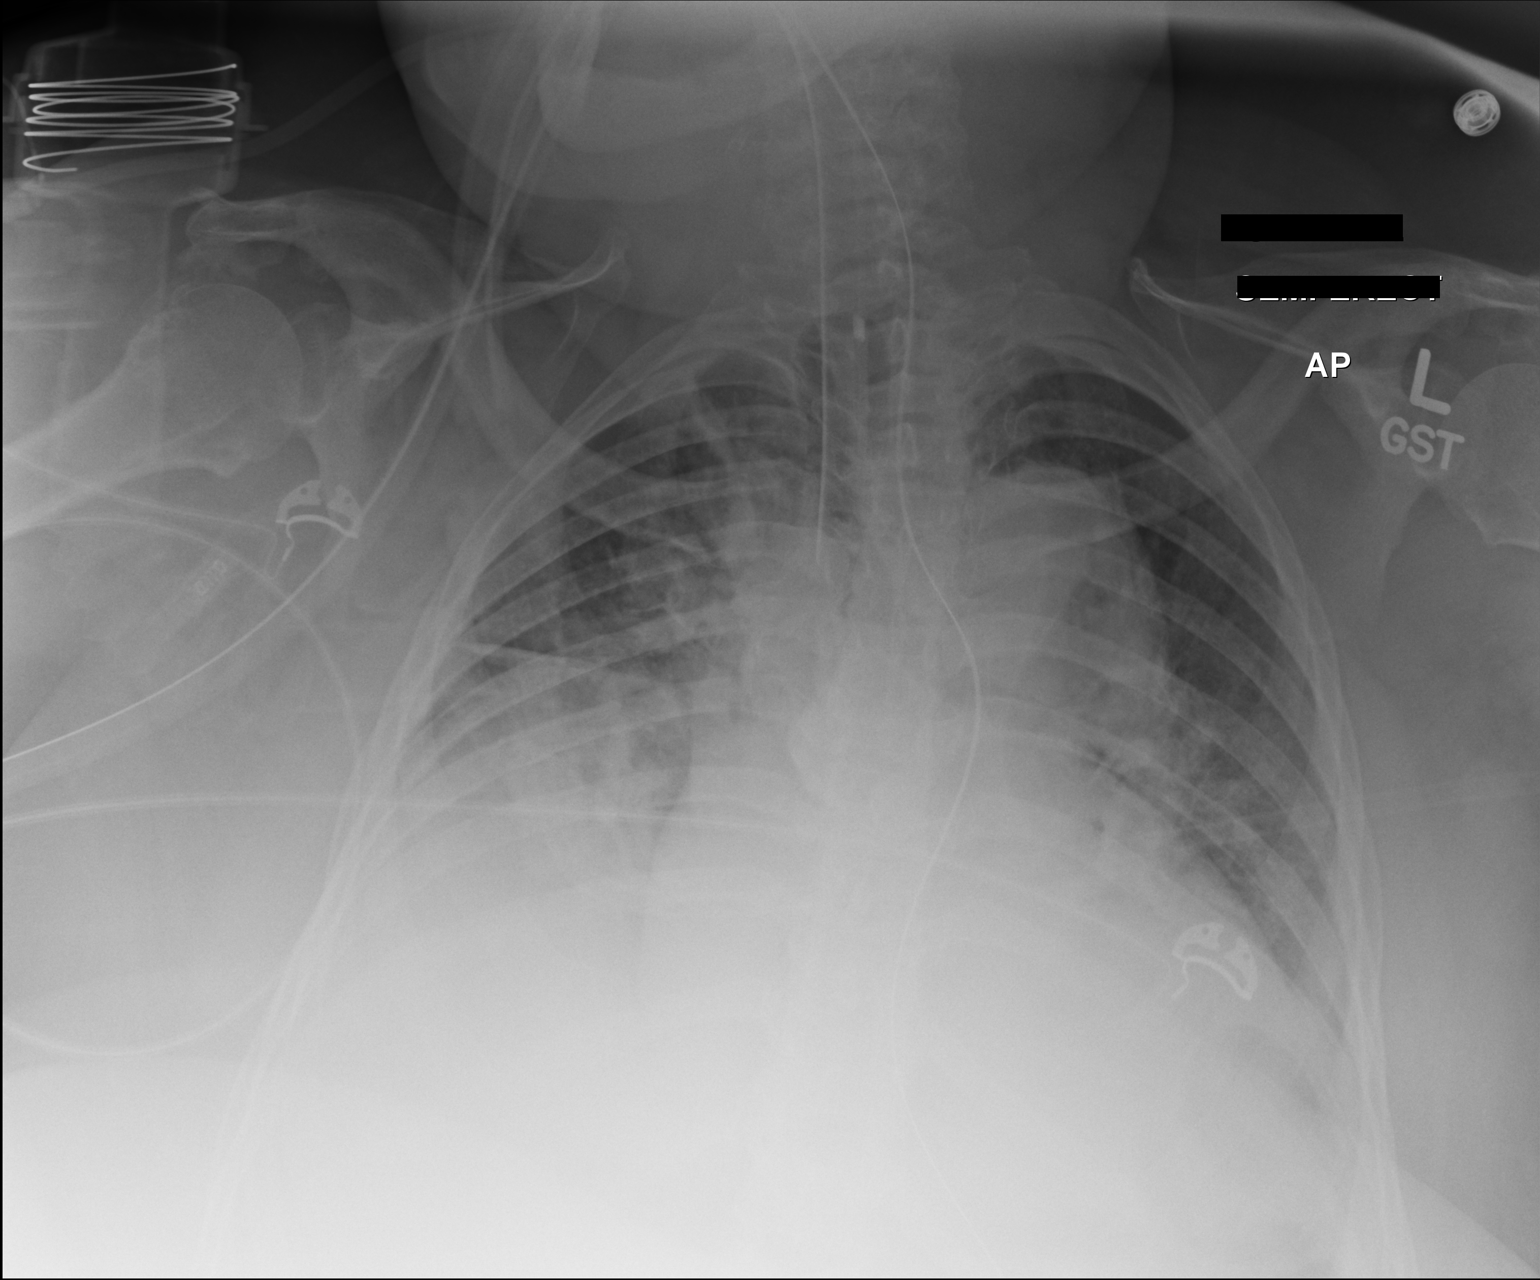

[1 of 1 positions shown; findings below may reference images not displayed]

FINDINGS: Portable semi upright AP view 5156 hours.  Endotracheal
tube tip stable just below the level of the clavicles.  Stable
enteric tube, tip not included.  Bilateral veiling pulmonary
opacity mildly greater on the right. Stable cardiomegaly and
mediastinal contours.  No pneumothorax.  No areas of worsening
ventilation identified.
IMPRESSION: 1. Stable lines and tubes.
2.  Stable ventilation with bilateral pleural effusions and lung
base atelectasis or consolidation.

## 2013-11-14 IMAGING — CR DG CHEST 1V PORT
1 series · 1 of 1 positions shown · non-contrast
Comparison: Earlier film of the same day

CLINICAL DATA: Intubated, respiratory failure.

PORTABLE CHEST - 1 VIEW

[AP]
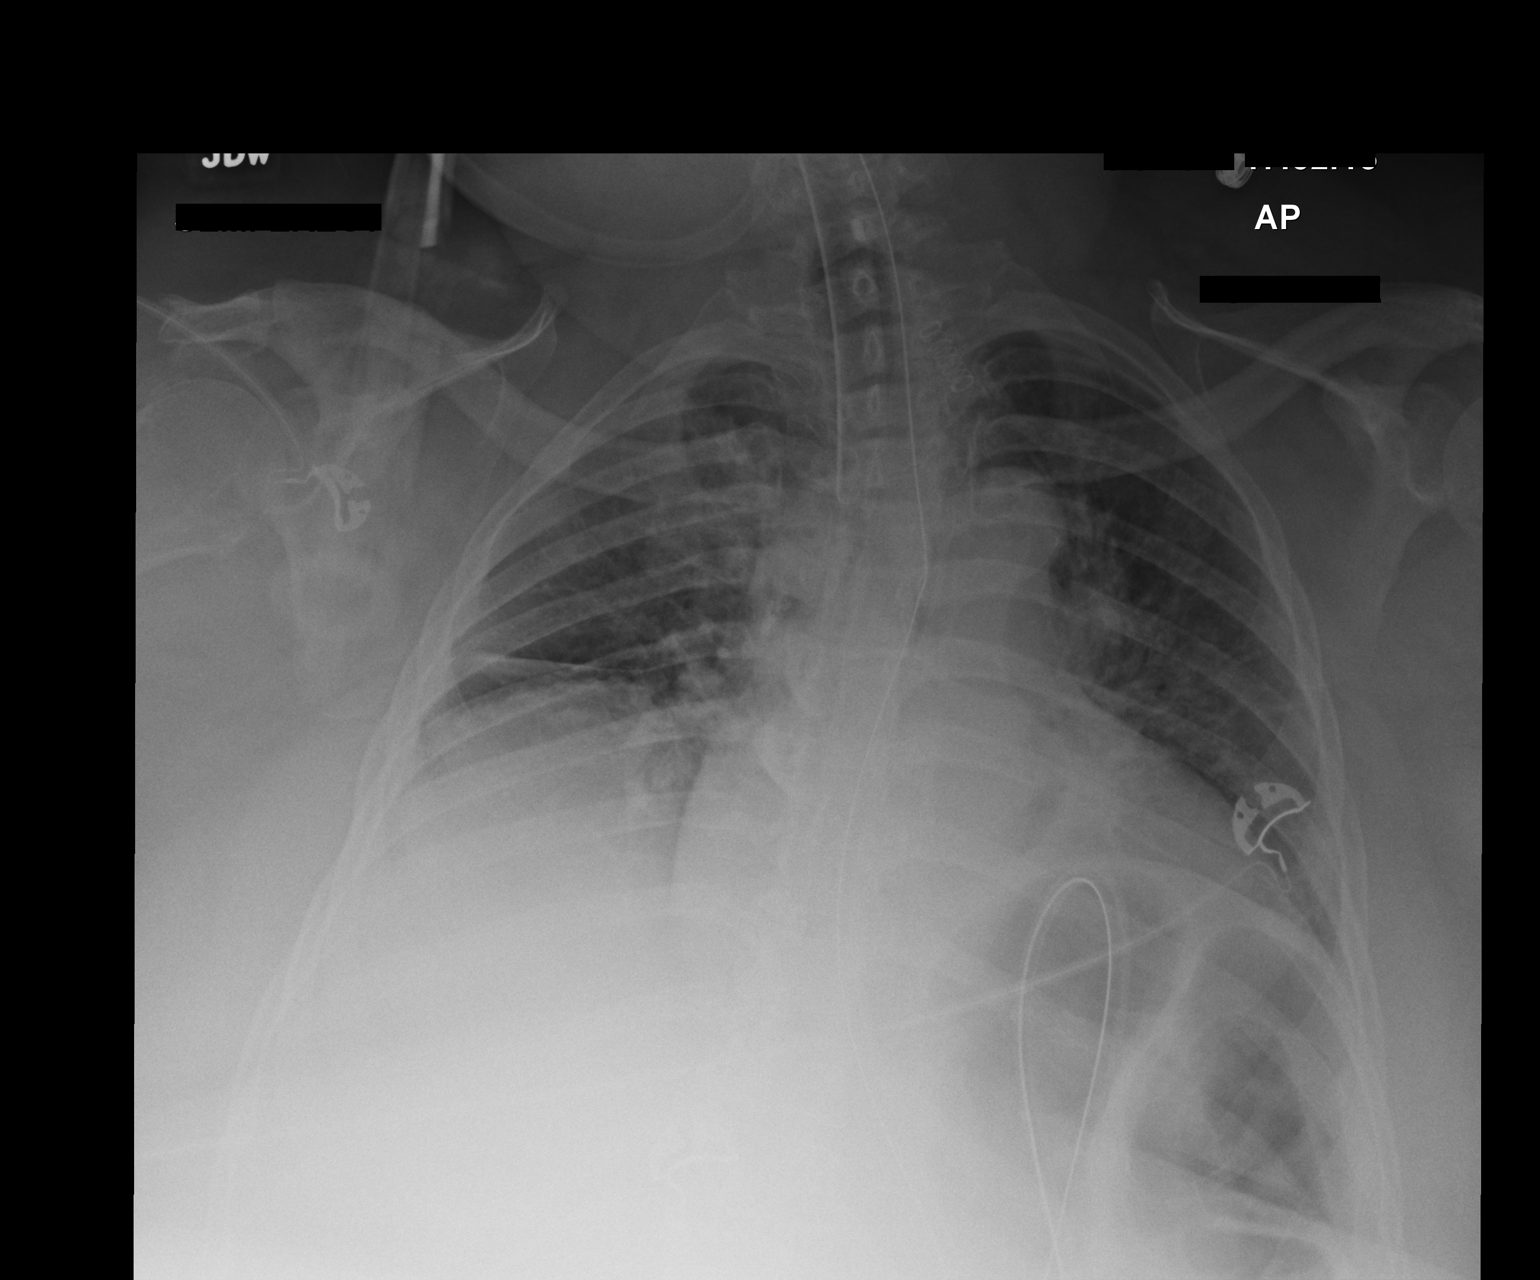

[1 of 1 positions shown; findings below may reference images not displayed]

FINDINGS: Endotracheal tube and nasogastric tube remain in place.
Low lung volumes with patchy perihilar and bibasilar atelectasis or
infiltrates, slightly improved since previous exam.  No definite
effusion.  Mild cardiomegaly.
IMPRESSION: 1.  Low volumes with some improvement in perihilar and bibasilar
atelectasis/infiltrates.
2. Support hardware stable in position.

## 2013-11-15 IMAGING — CR DG CHEST 1V PORT
1 series · 1 of 1 positions shown · non-contrast
Comparison: 11/07/2012

CLINICAL DATA: Left IJ central line placement.

PORTABLE CHEST - 1 VIEW

[AP]
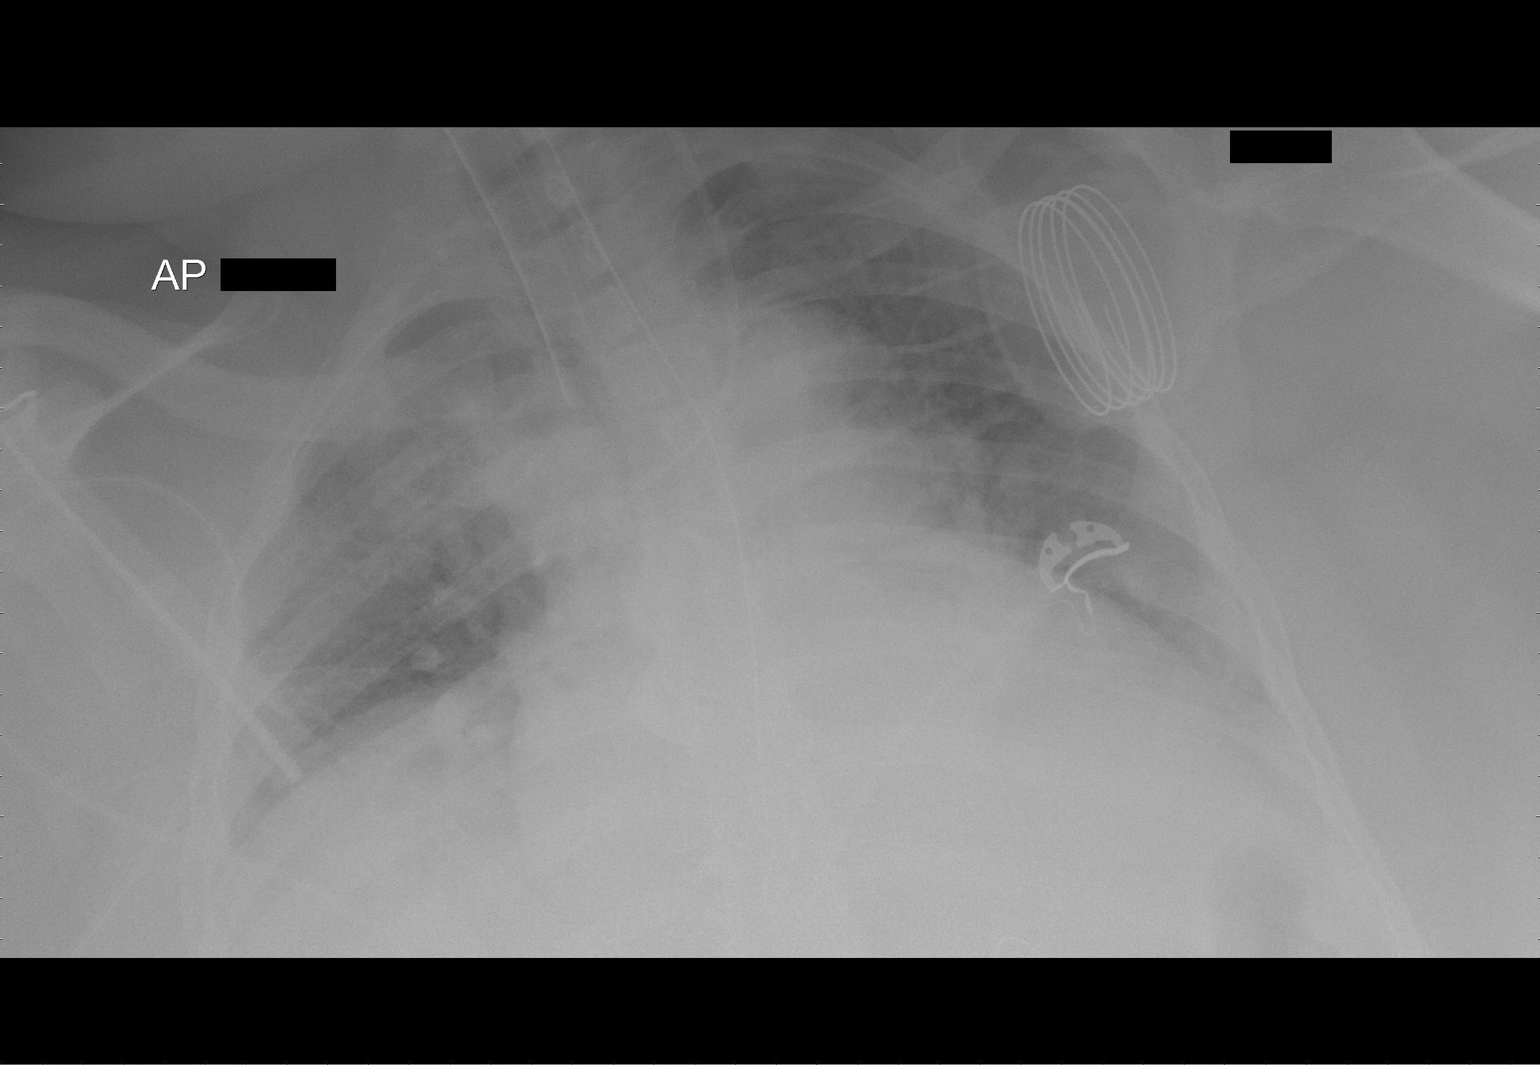

[1 of 1 positions shown; findings below may reference images not displayed]

FINDINGS: Left jugular central line has been placed.  The tip of
the catheter is near the junction of the left innominate vein and
SVC.  Endotracheal tube is approximate 3.1 cm above the carina.
There are very low lung volumes.  No evidence for a pneumothorax.
Stable appearance of the heart and mediastinum.  Cannot exclude
left basilar densities.  Nasogastric tube is in the upper abdomen.
IMPRESSION: Left jugular central line tip is near the junction of the left
innominate vein and SVC.  Negative for a pneumothorax.

Low lung volumes.

## 2013-11-15 IMAGING — CR DG CHEST 1V PORT
1 series · 1 of 1 positions shown · non-contrast
Comparison: 11/06/2012; 11/05/2012

CLINICAL DATA: Evaluate endotracheal tube positioning

PORTABLE CHEST - 1 VIEW

[AP]
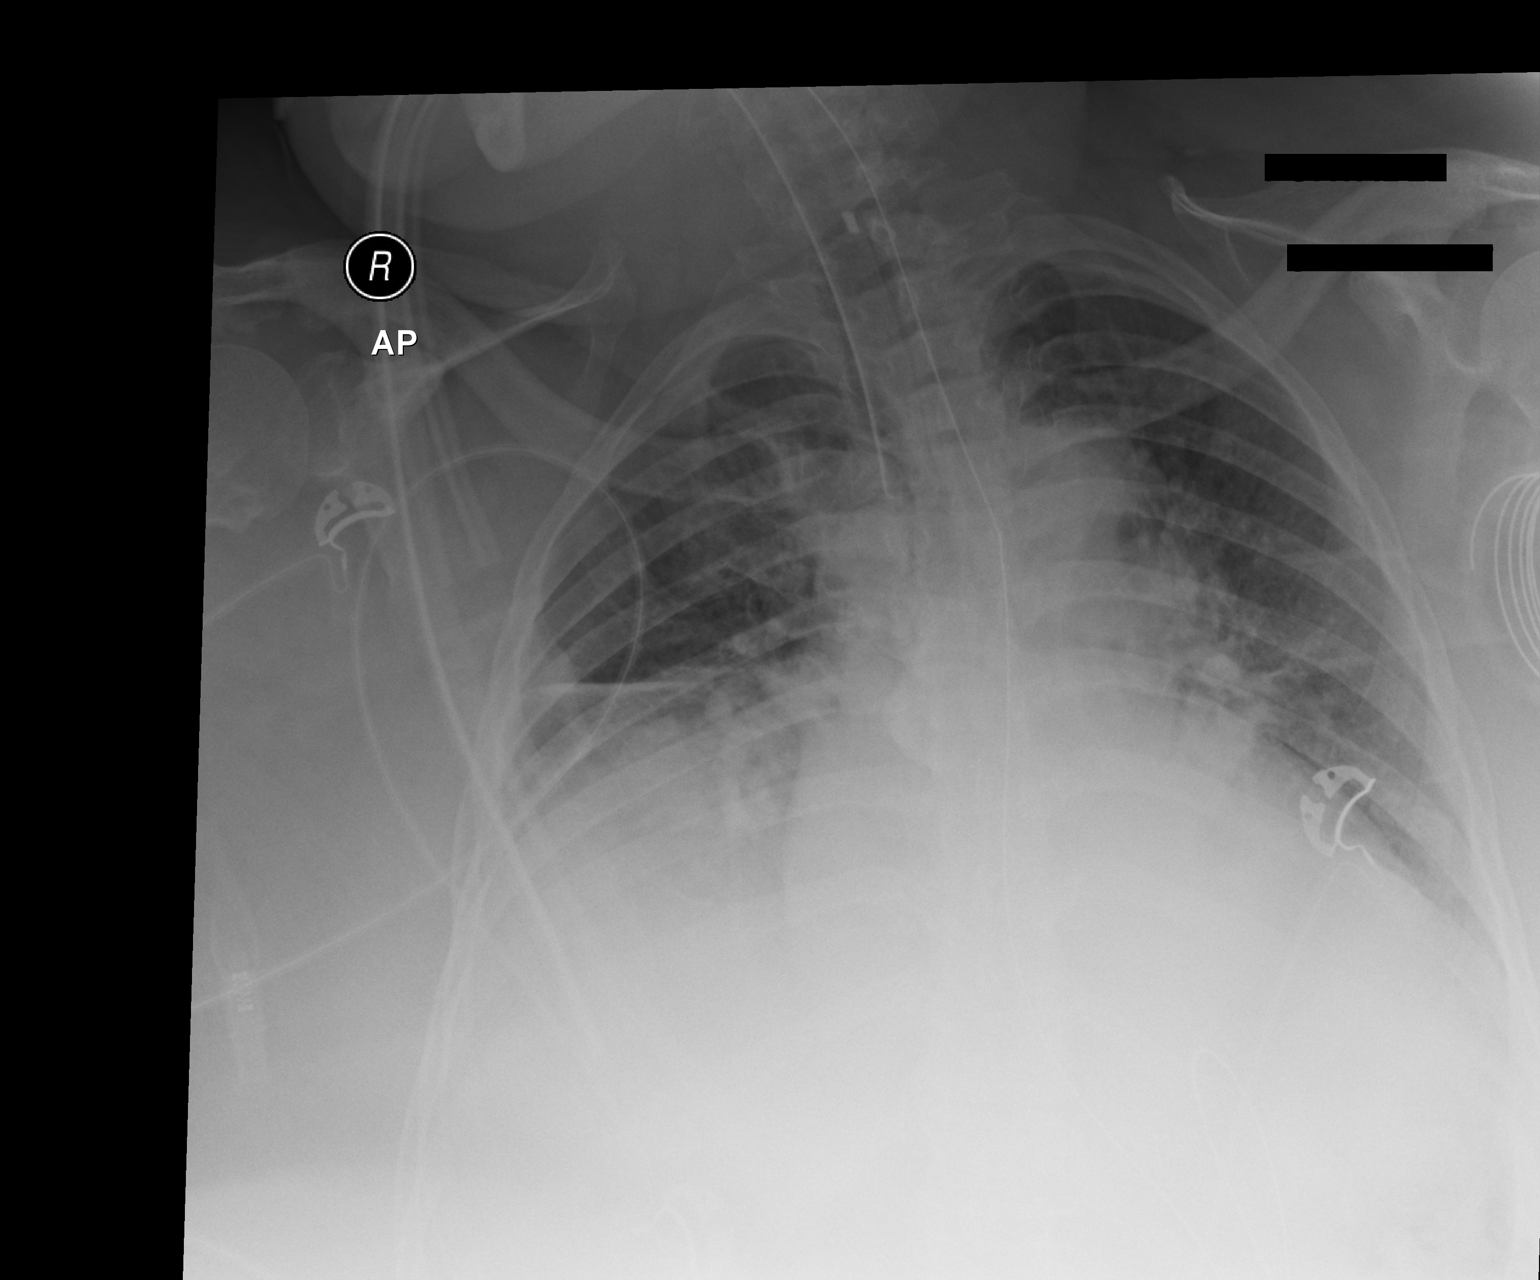

[1 of 1 positions shown; findings below may reference images not displayed]

FINDINGS: Grossly unchanged cardiac silhouette and mediastinal
contours given persistently reduced lung volumes and patient
rotation.  Stable positioning of support apparatus.  The pulmonary
vasculature remains indistinct.  Suspected worsening of perihilar
and bibasilar heterogeneous / consolidative opacities.  Small/trace
bilateral effusions appear unchanged.  No definite pneumothorax.
Unchanged bones.
IMPRESSION: 1.  Stable positioning of support apparatus.  No pneumothorax.
2.  Suspected minimal worsening of pulmonary edema and
perihilar/bibasilar atelectasis.

## 2013-11-16 IMAGING — CR DG CHEST 1V PORT
1 series · 1 of 1 positions shown · non-contrast
Comparison: 11/07/2012 and earlier.

CLINICAL DATA: 62-year-old female with acute and chronic
respiratory failure.

PORTABLE CHEST - 1 VIEW

[AP]
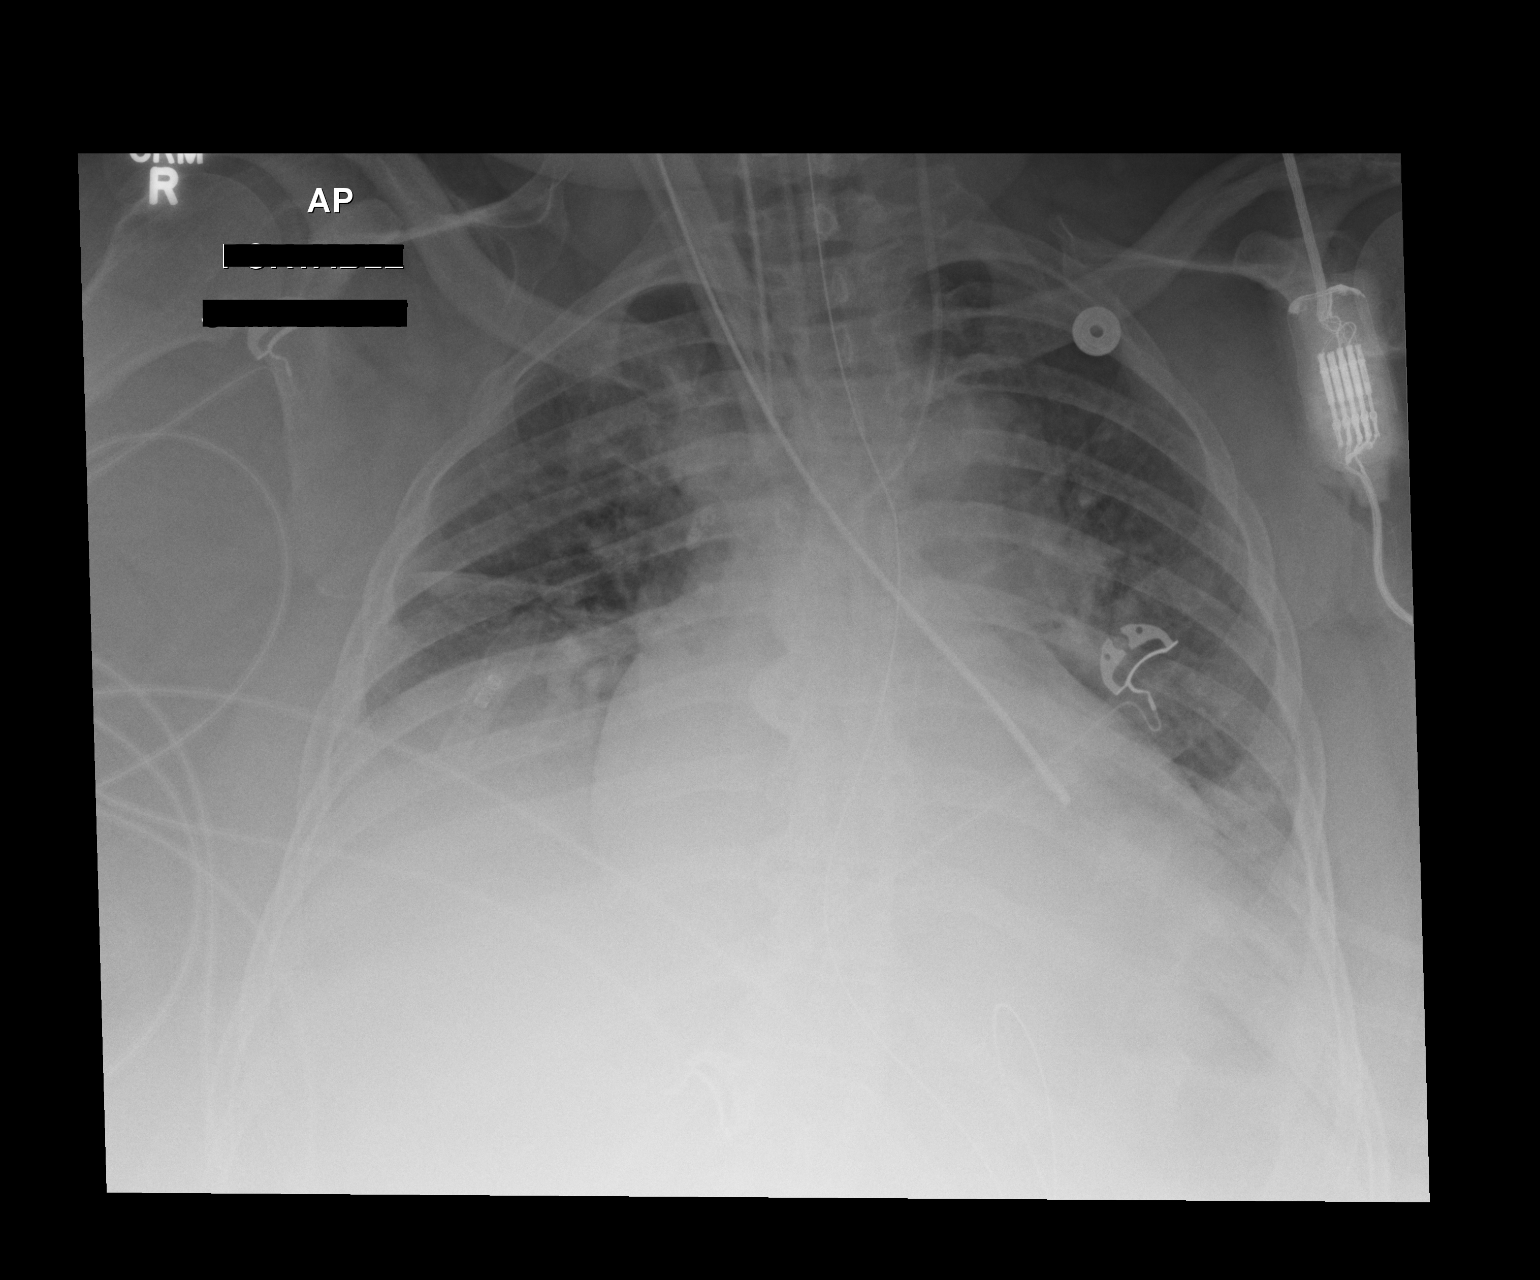

[1 of 1 positions shown; findings below may reference images not displayed]

FINDINGS: AP portable semi upright view at 2118 hours.  Stable
endotracheal tube tip.  Enteric tube courses to the left abdomen as
before, tip not included.  Stable left IJ central line, tip in the
midline.  Stable lung volumes. Stable cardiomegaly and mediastinal
contours.  No pneumothorax.  No large pleural effusion.  Bibasilar
pulmonary opacity is not significantly changed and in part relate
to elevation of the diaphragm.  No areas of worsening ventilation.
IMPRESSION: 1. Stable lines and tubes.
2.  No significant change in ventilation since 11/05/2012.

## 2014-01-27 IMAGING — CR DG CHEST 1V PORT
1 series · 1 of 1 positions shown · non-contrast
Comparison: Chest radiograph performed 01/13/2013

CLINICAL DATA: Endotracheal tube and orogastric tube placement.

PORTABLE CHEST - 1 VIEW

[AP]
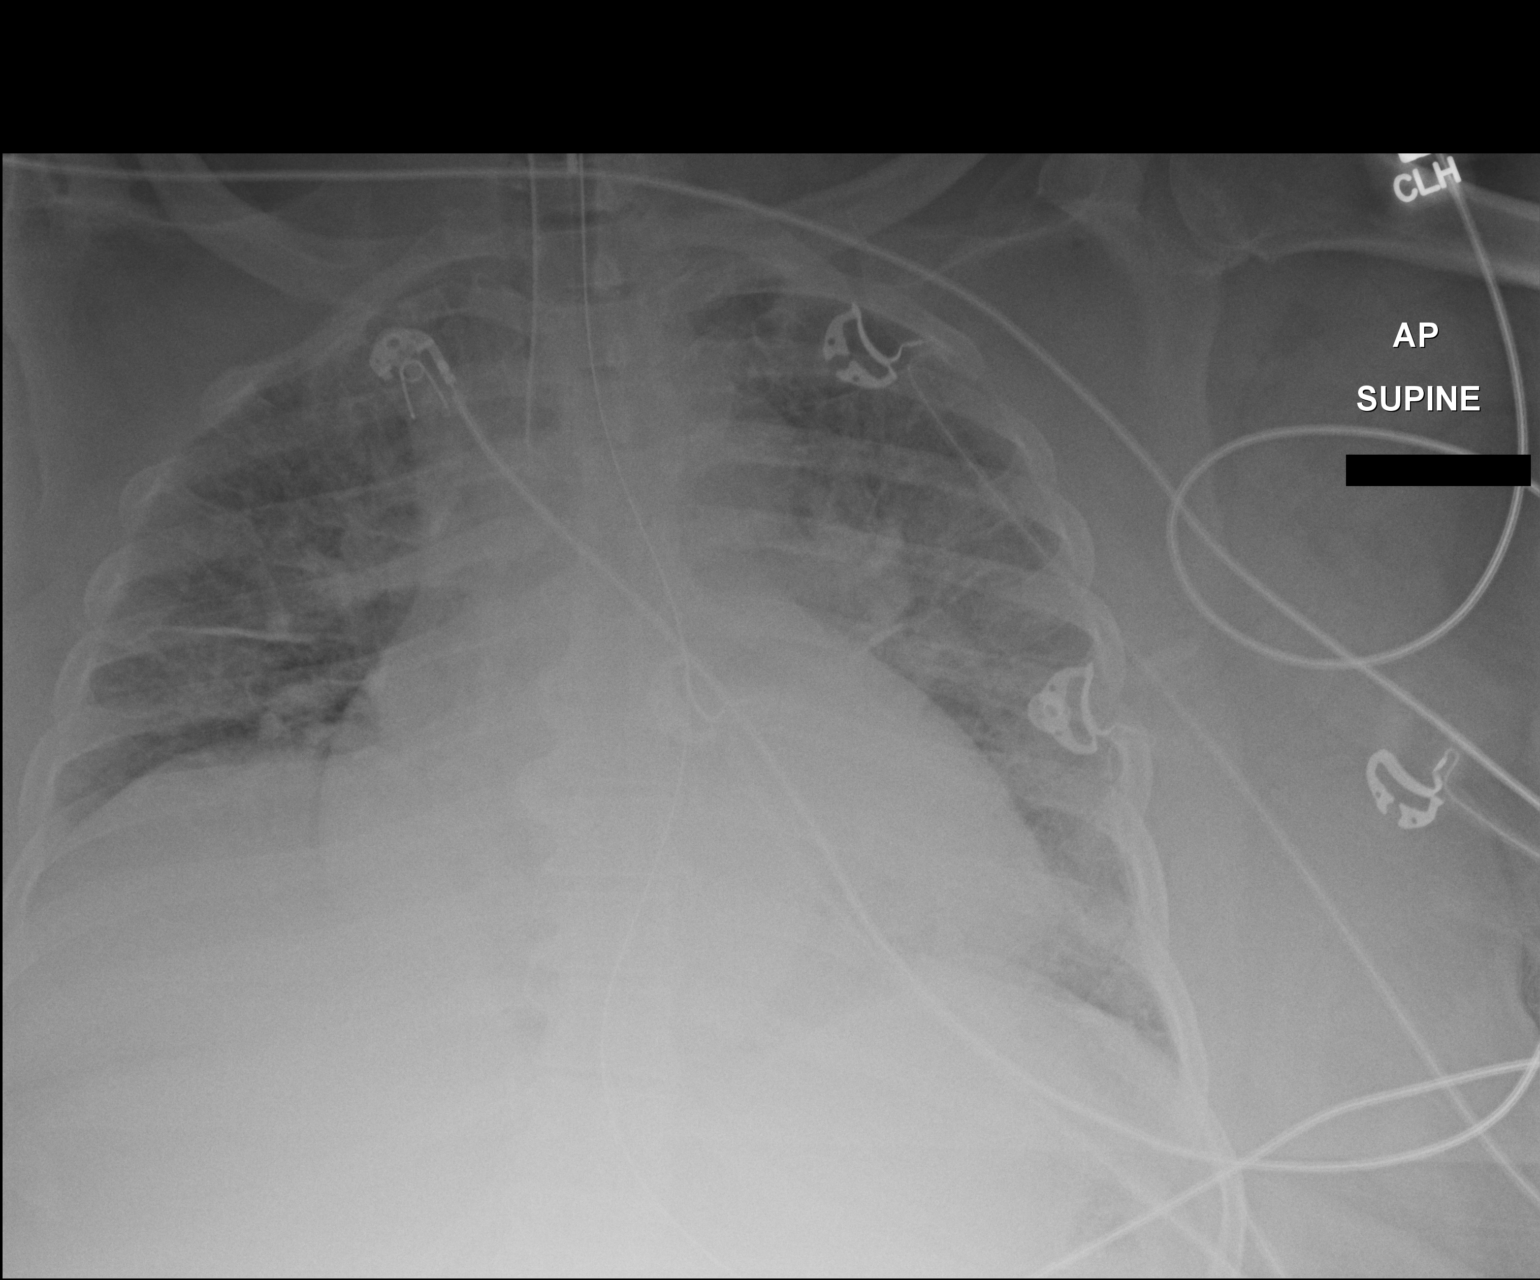

[1 of 1 positions shown; findings below may reference images not displayed]

FINDINGS: The patient's endotracheal tube is seen ending 2 cm above
the carina.  An orogastric tube is seen extending below the
diaphragm.  There is elevation of the right hemidiaphragm.

The lungs remain somewhat hypoexpanded.  Vascular congestion is
again noted, with diffusely increased interstitial markings, likely
reflecting pulmonary edema.  No definite pleural effusion or
pneumothorax is seen.

The cardiomediastinal silhouette is enlarged.  No acute osseous
abnormalities are seen.
IMPRESSION: 1.  Endotracheal tube seen ending 2 cm above the carina.
2.  Orogastric tube noted extending below the diaphragm.
3.  Lungs hypoexpanded; vascular congestion and cardiomegaly again
noted, with increased interstitial markings, likely reflecting mild
pulmonary edema.

## 2014-01-28 IMAGING — CR DG CHEST 1V PORT
1 series · 1 of 1 positions shown · non-contrast
Comparison: 01/19/2013

CLINICAL DATA: Respiratory failure

PORTABLE CHEST - 1 VIEW

[AP]
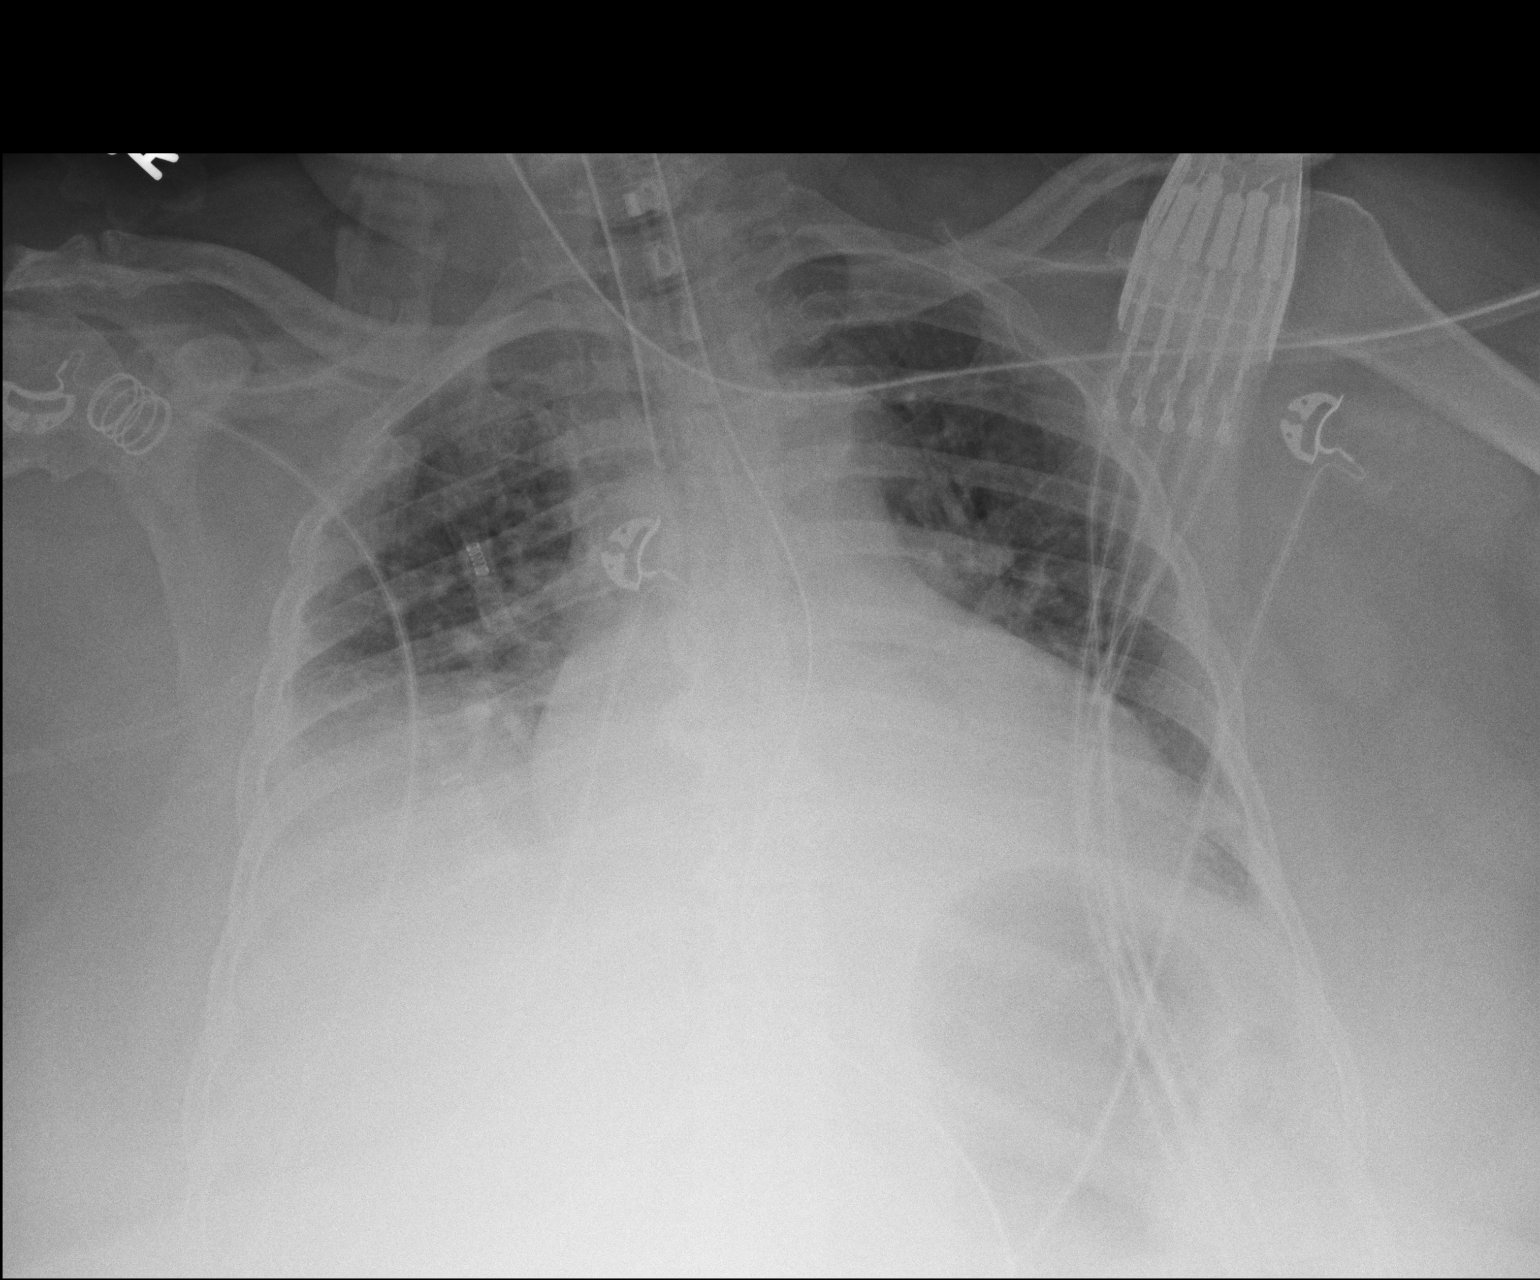

[1 of 1 positions shown; findings below may reference images not displayed]

FINDINGS: ET tube tip is above the carina.  The nasogastric tube tip is
within the stomach.  The heart size is mildly enlarged.  Lung
volumes are low and there is asymmetric elevation of the right
hemidiaphragm.  Slight interval improvement in pulmonary edema.
IMPRESSION: 1.  Stable support apparatus.
2.  Slight improvement in interstitial edema pattern.

## 2014-01-29 IMAGING — CR DG CHEST 1V PORT
1 series · 1 of 1 positions shown · non-contrast
Comparison: Chest x-ray 01/20/2013.

CLINICAL DATA: Respiratory failure.

PORTABLE CHEST - 1 VIEW

[AP]
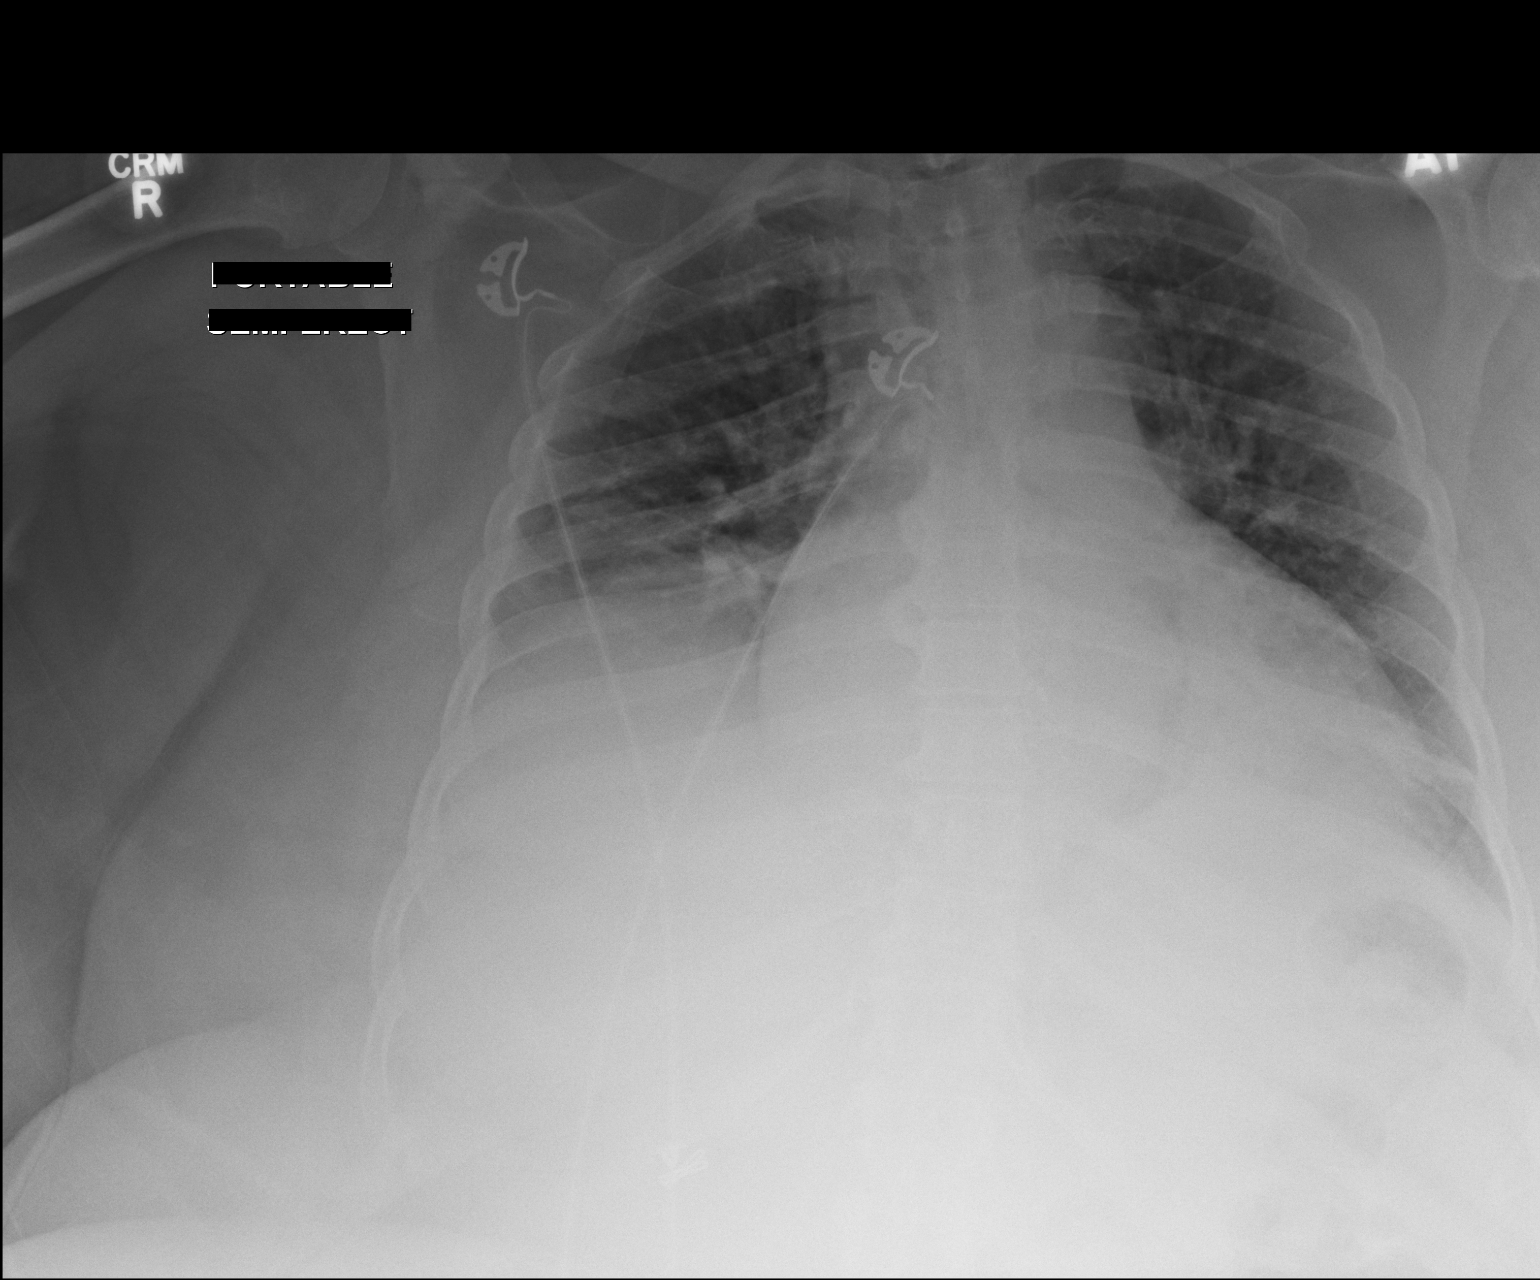

[1 of 1 positions shown; findings below may reference images not displayed]

FINDINGS: The patient has been extubated and previously noted
nasogastric tube has been removed.  Lung volumes remain the level,
and there are bibasilar opacities which may reflect areas of
atelectasis and/or consolidation. Trace right pleural effusion
(unchanged).  Cephalization and crowding of the pulmonary
vasculature, without frank pulmonary edema.  Heart size is mildly
enlarged. The patient is rotated to the right on today's exam,
resulting in distortion of the mediastinal contours and reduced
diagnostic sensitivity and specificity for mediastinal pathology.
Surgical clips project over the right upper quadrant of the
abdomen, likely from prior cholecystectomy.
IMPRESSION: 1.  Support apparatus have been removed.
2.  Low lung volumes with bibasilar atelectasis and/or
consolidation (left greater than right).
3.  Trace right pleural effusion.
4.  Cardiomegaly with pulmonary venous congestion, but no frank
pulmonary edema.

## 2014-04-11 IMAGING — CT CT HEAD W/O CM
2 series · 16 of 30 positions shown, 20 images · non-contrast
Comparison: Prior CT from 11/05/2012

CLINICAL DATA: Altered mental status

CT HEAD WITHOUT CONTRAST
TECHNIQUE: Contiguous axial images were obtained from the base of
the skull through the vertex without contrast.

[Series 3: head w/o · axial · non-contrast · 0.49mm/px · z∈[+79,+214]mm · 13 of 33 slices shown, 17 images]
[im 3/33  brain]
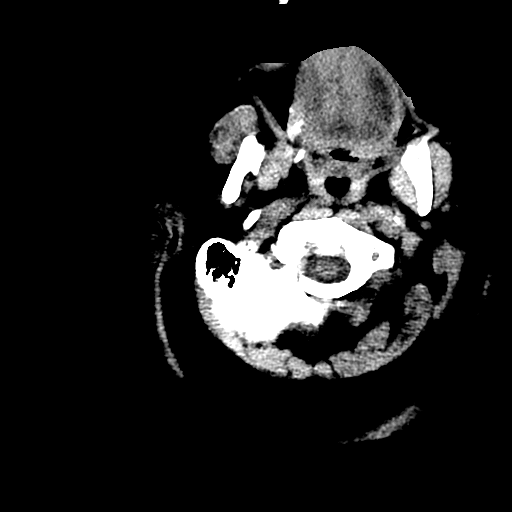
[im 3/33  bone]
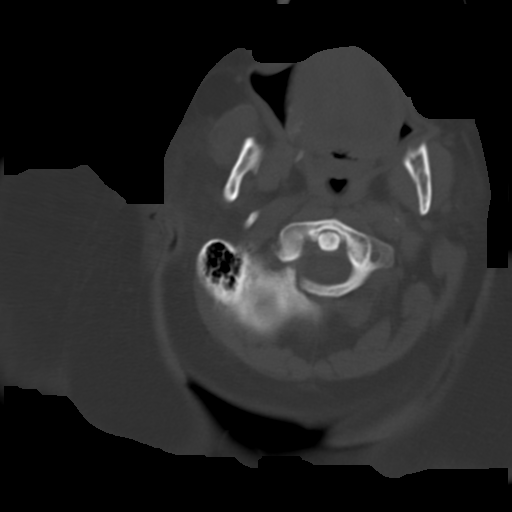
[im 5/33  brain]
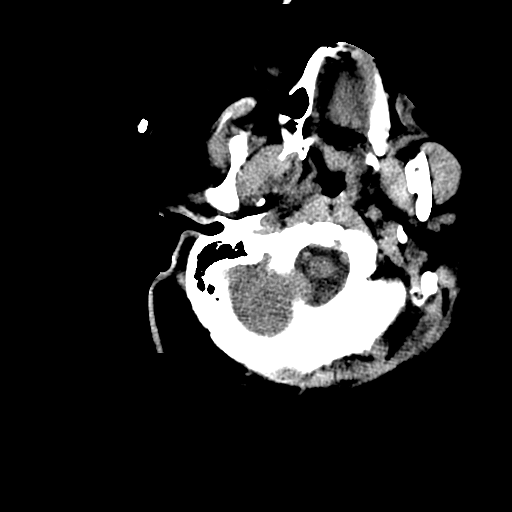
[im 7/33  brain]
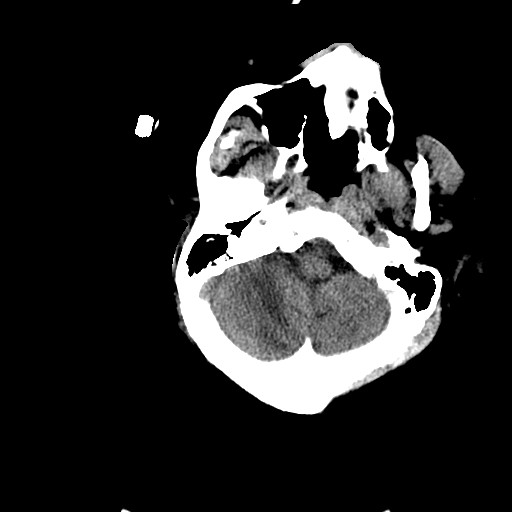
[im 10/33  brain]
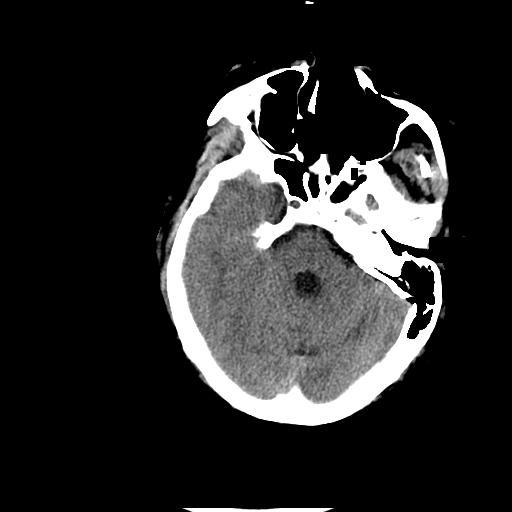
[im 12/33  brain]
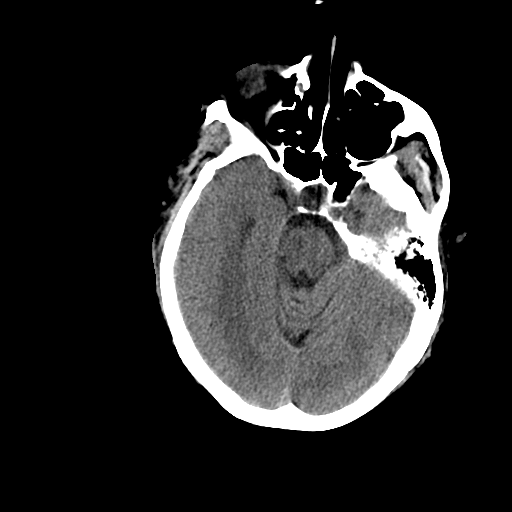
[im 12/33  bone]
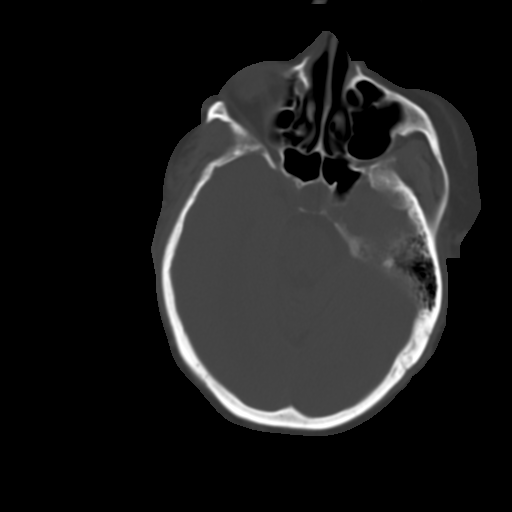
[im 14/33  brain]
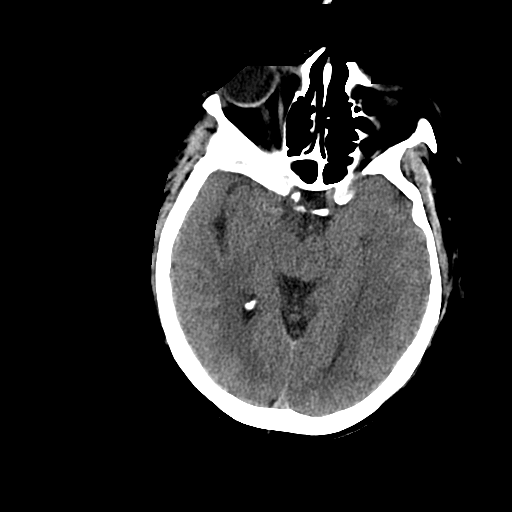
[im 17/33  brain]
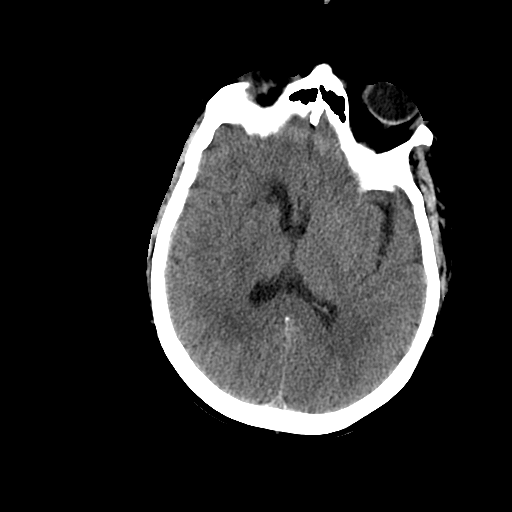
[im 19/33  brain]
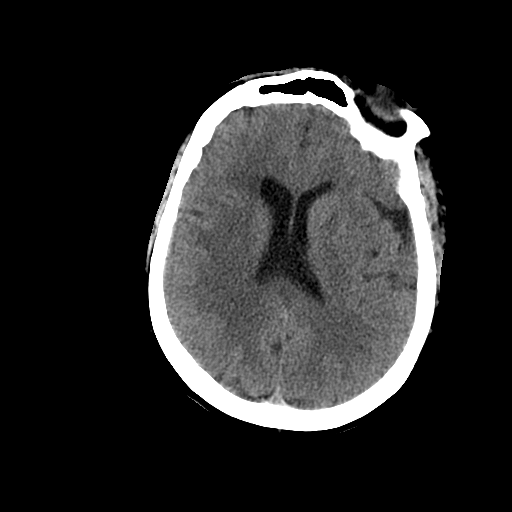
[im 21/33  brain]
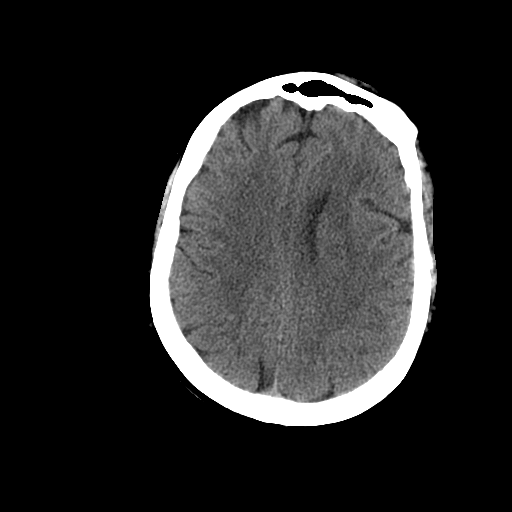
[im 21/33  bone]
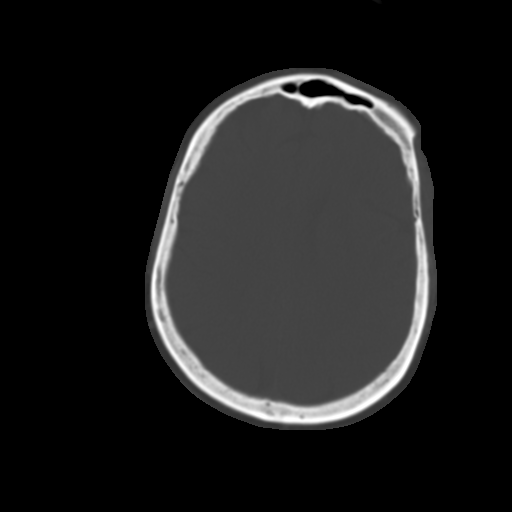
[im 23/33  brain]
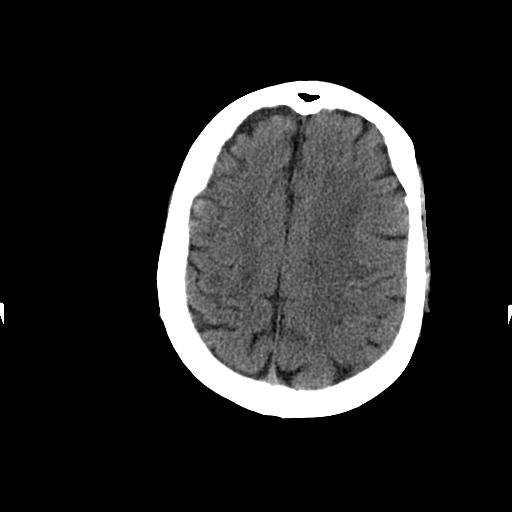
[im 26/33  brain]
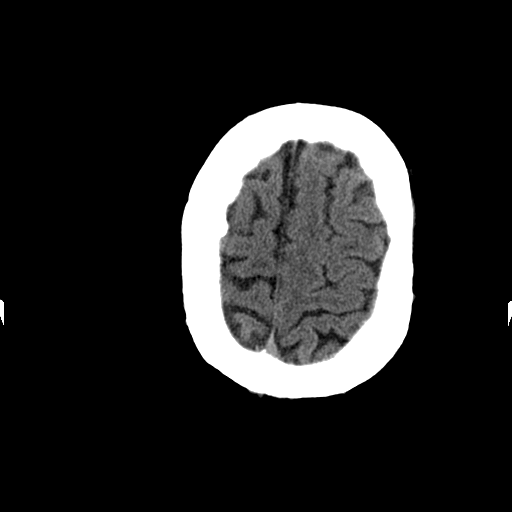
[im 28/33  brain]
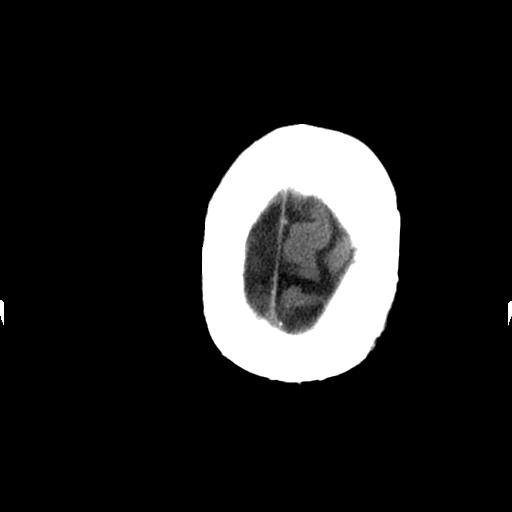
[im 30/33  brain]
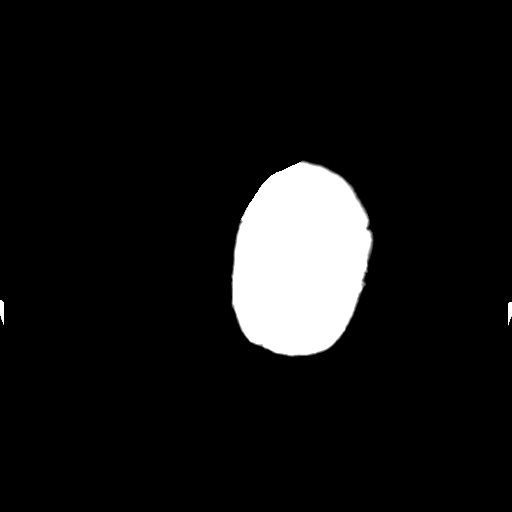
[im 30/33  bone]
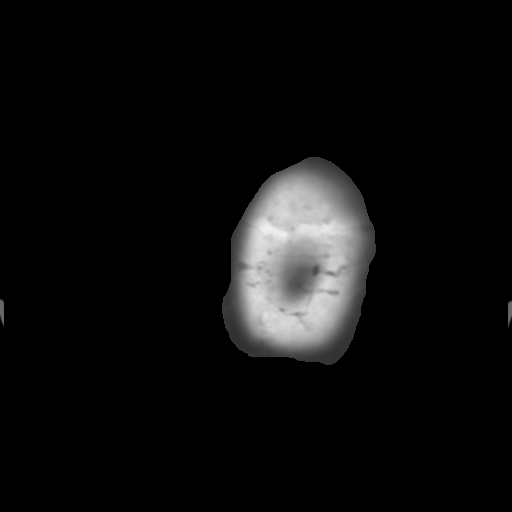

[Series 4: head w/o bone · axial · non-contrast · 0.49mm/px · z∈[+79,+124]mm · 3 of 33 slices shown]
[im 3/33  bone]
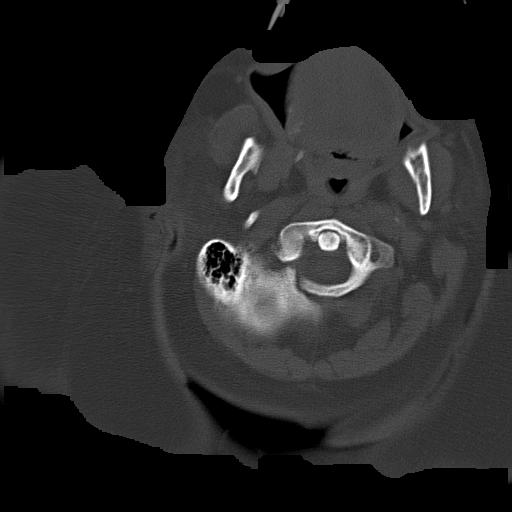
[im 7/33  bone]
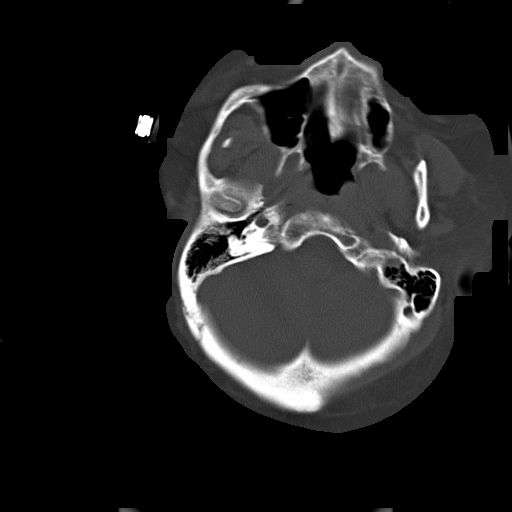
[im 12/33  bone]
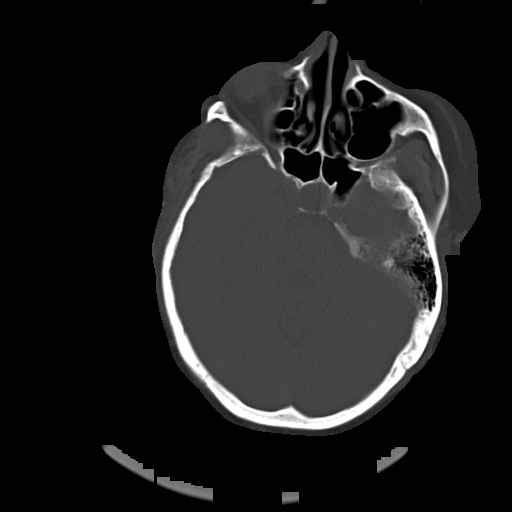

[16 of 30 positions shown; findings below may reference images not displayed]

FINDINGS: There is no acute intracranial hemorrhage or infarct.
Mild chronic microvascular ischemic disease is unchanged.  Remote
right basal ganglia lacunar infarcts are noted.  No extra-axial
fluid collection.  No midline shift or mass lesion.  Calvarium is
intact.

Paranasal sinuses and mastoid air cells are clear.
IMPRESSION: 1.  No acute intracranial process.
2.  Remote lacunar infarcts within the right basal ganglia.
3.  Mild chronic microvascular ischemic disease.

## 2014-04-11 IMAGING — DX DG CHEST 1V PORT
1 series · 1 of 1 positions shown · non-contrast
Comparison: January 21, 2013

CLINICAL DATA: Shortness of Breath

EXAM:
PORTABLE CHEST - 1 VIEW

[portable]
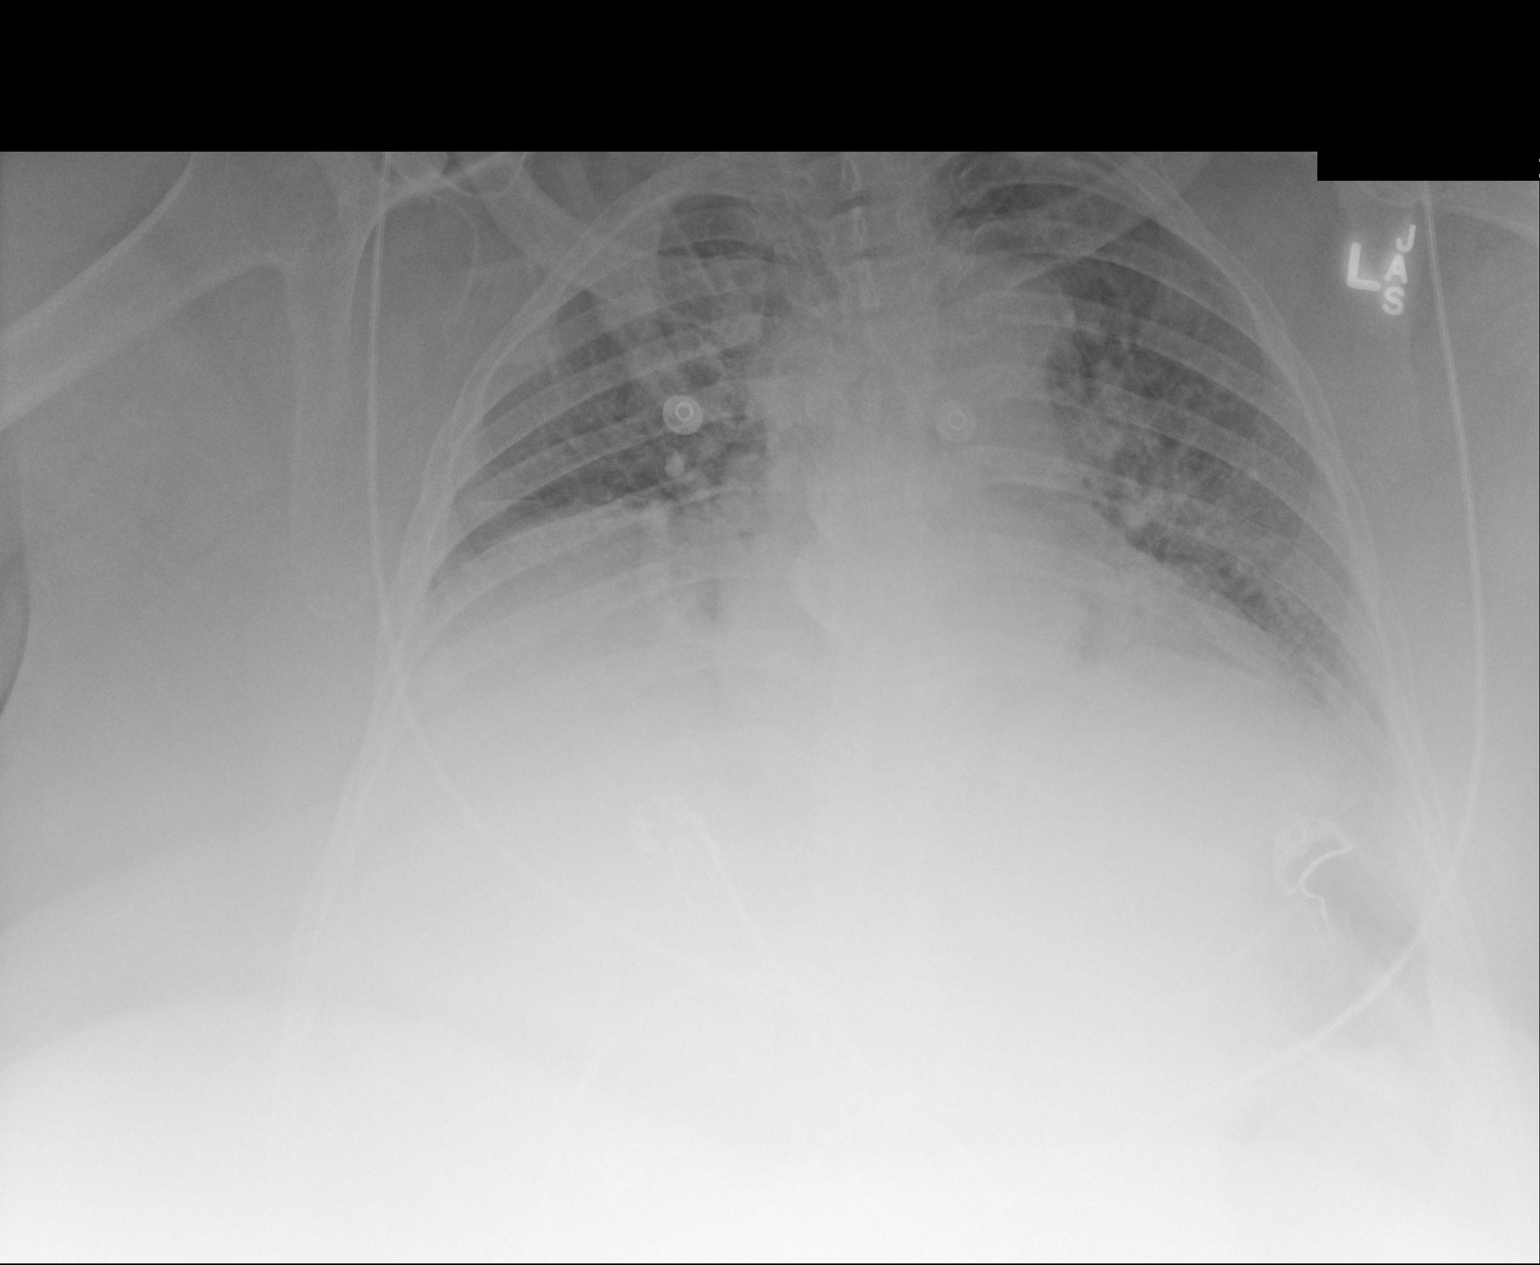

[1 of 1 positions shown; findings below may reference images not displayed]

FINDINGS: There is right base consolidation. Lungs are otherwise clear. Degree
of inspiration shallow. Heart is upper normal in size with normal
pulmonary vascularity. No adenopathy.
IMPRESSION: Right base consolidation.

## 2014-04-12 IMAGING — CR DG CHEST 1V PORT
1 series · 1 of 1 positions shown · non-contrast
Comparison: 04/03/2013

CLINICAL DATA: Intubated, shortness of Breath.

EXAM:
PORTABLE CHEST - 1 VIEW

[AP]
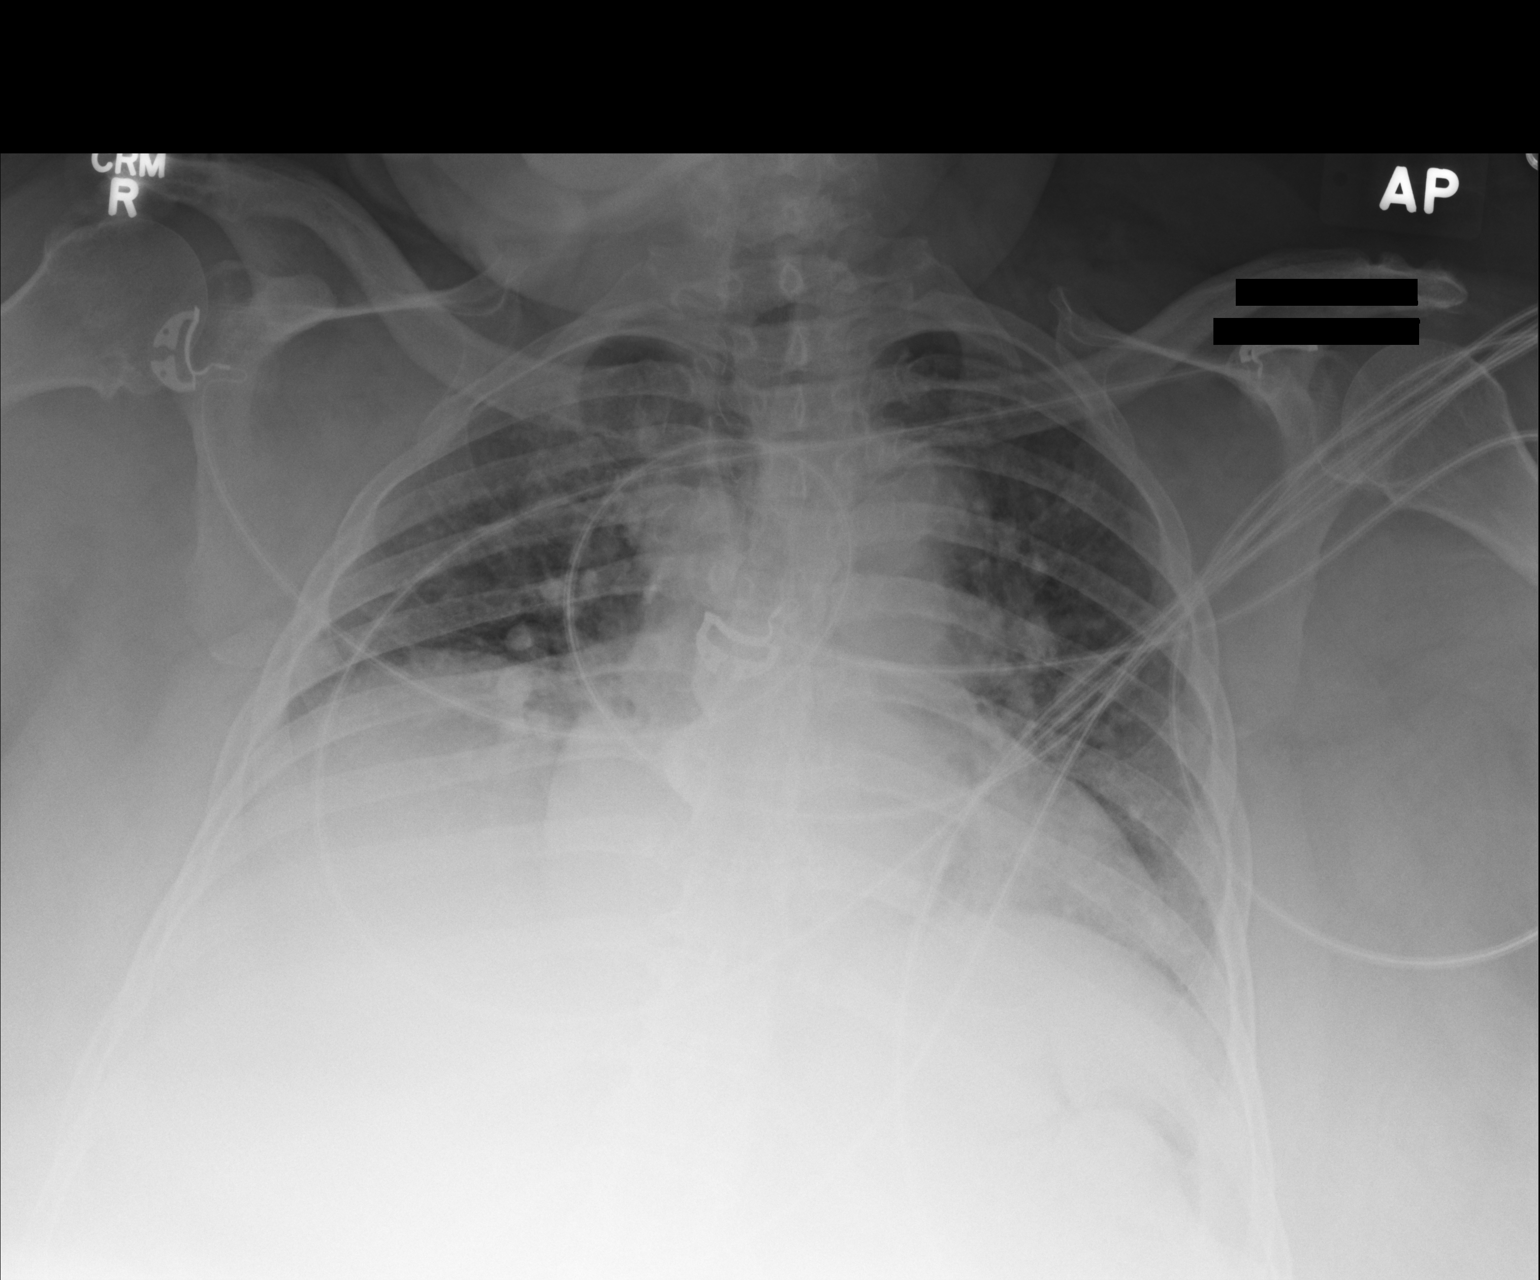

[1 of 1 positions shown; findings below may reference images not displayed]

FINDINGS: Very low lung volumes. Cardiomegaly with vascular congestion and
bibasilar atelectasis. No significant change since prior study. No
visible effusions.
IMPRESSION: No interval change.

## 2014-04-13 IMAGING — DX DG CHEST 1V PORT
1 series · 1 of 1 positions shown · non-contrast
Comparison: 04/04/2013

CLINICAL DATA: Follow-up respiratory failure

EXAM:
PORTABLE CHEST - 1 VIEW

[portable]
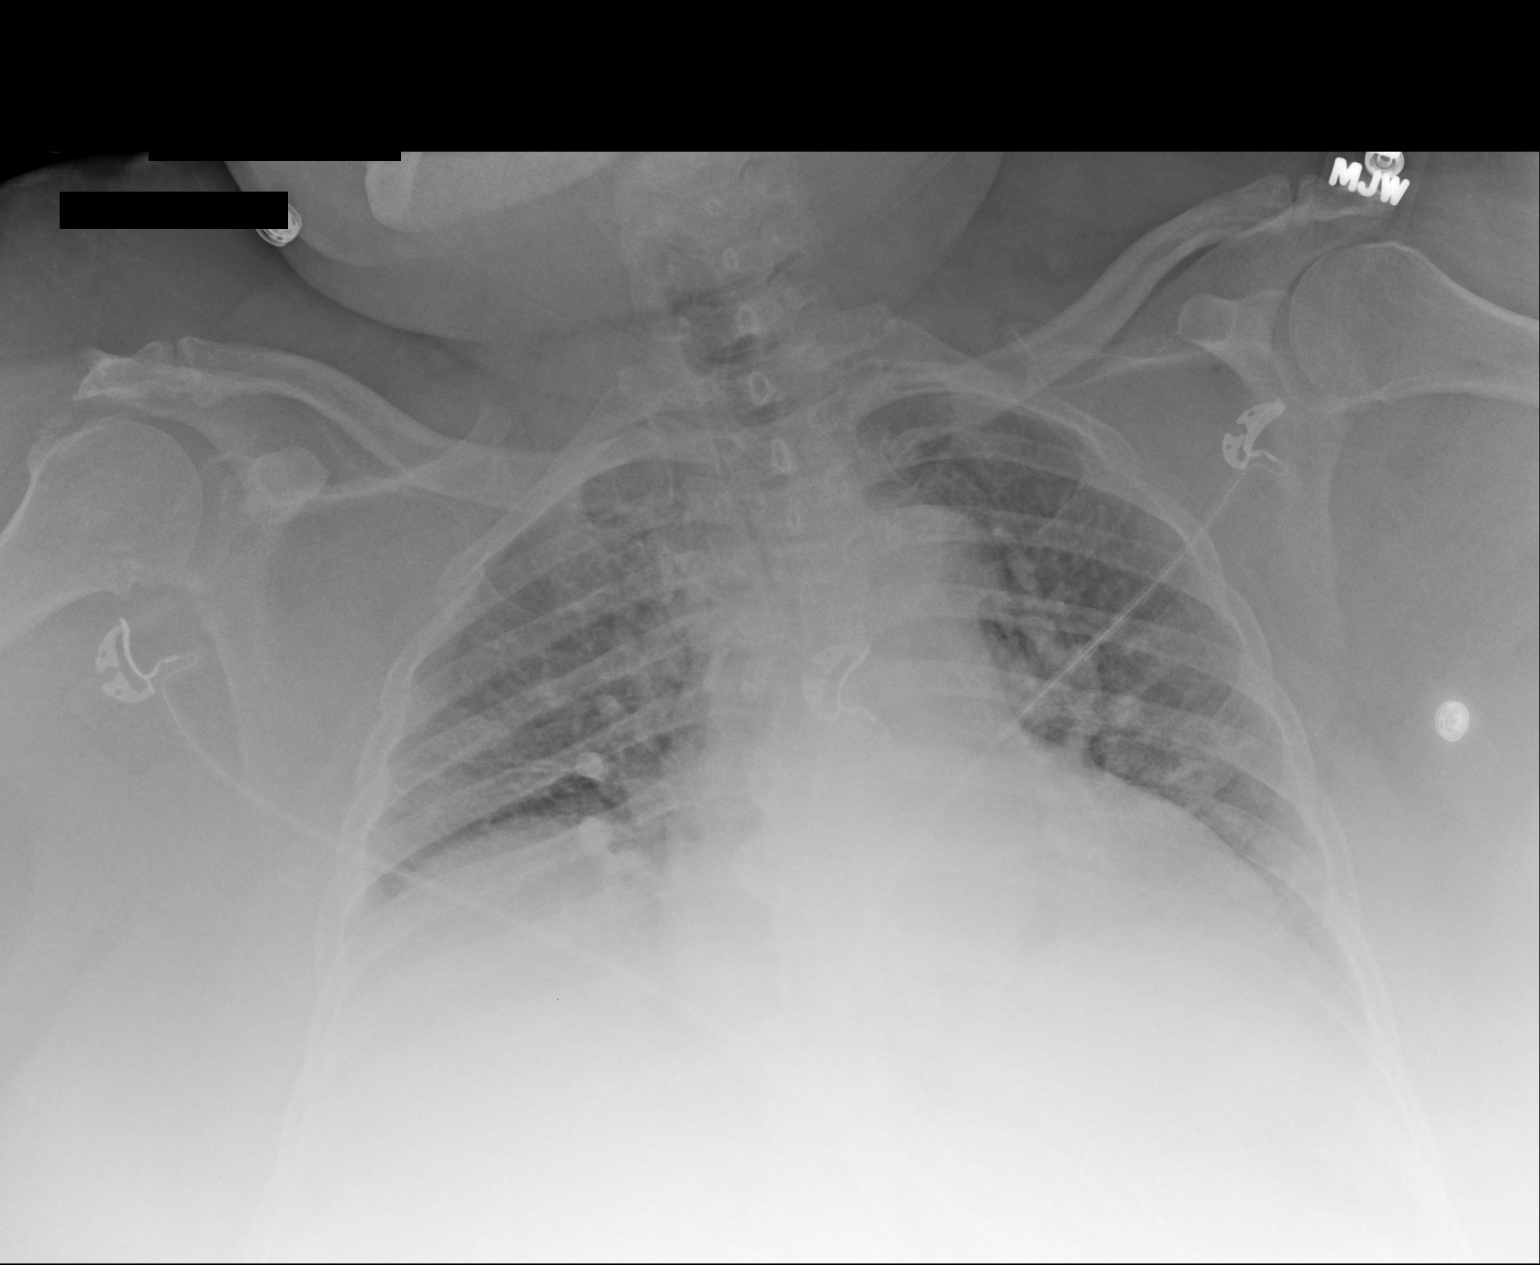

[1 of 1 positions shown; findings below may reference images not displayed]

FINDINGS: Low lung volumes with vascular crowding. No frank interstitial
edema. No pleural effusion or pneumothorax.

Cardiomegaly.
IMPRESSION: Low lung volumes.

## 2014-04-14 IMAGING — CR DG CHEST 1V PORT
1 series · 1 of 1 positions shown · non-contrast
Comparison: 04/05/2013

CLINICAL DATA: Right basilar atelectasis versus infiltrate

EXAM:
PORTABLE CHEST - 1 VIEW

[AP]
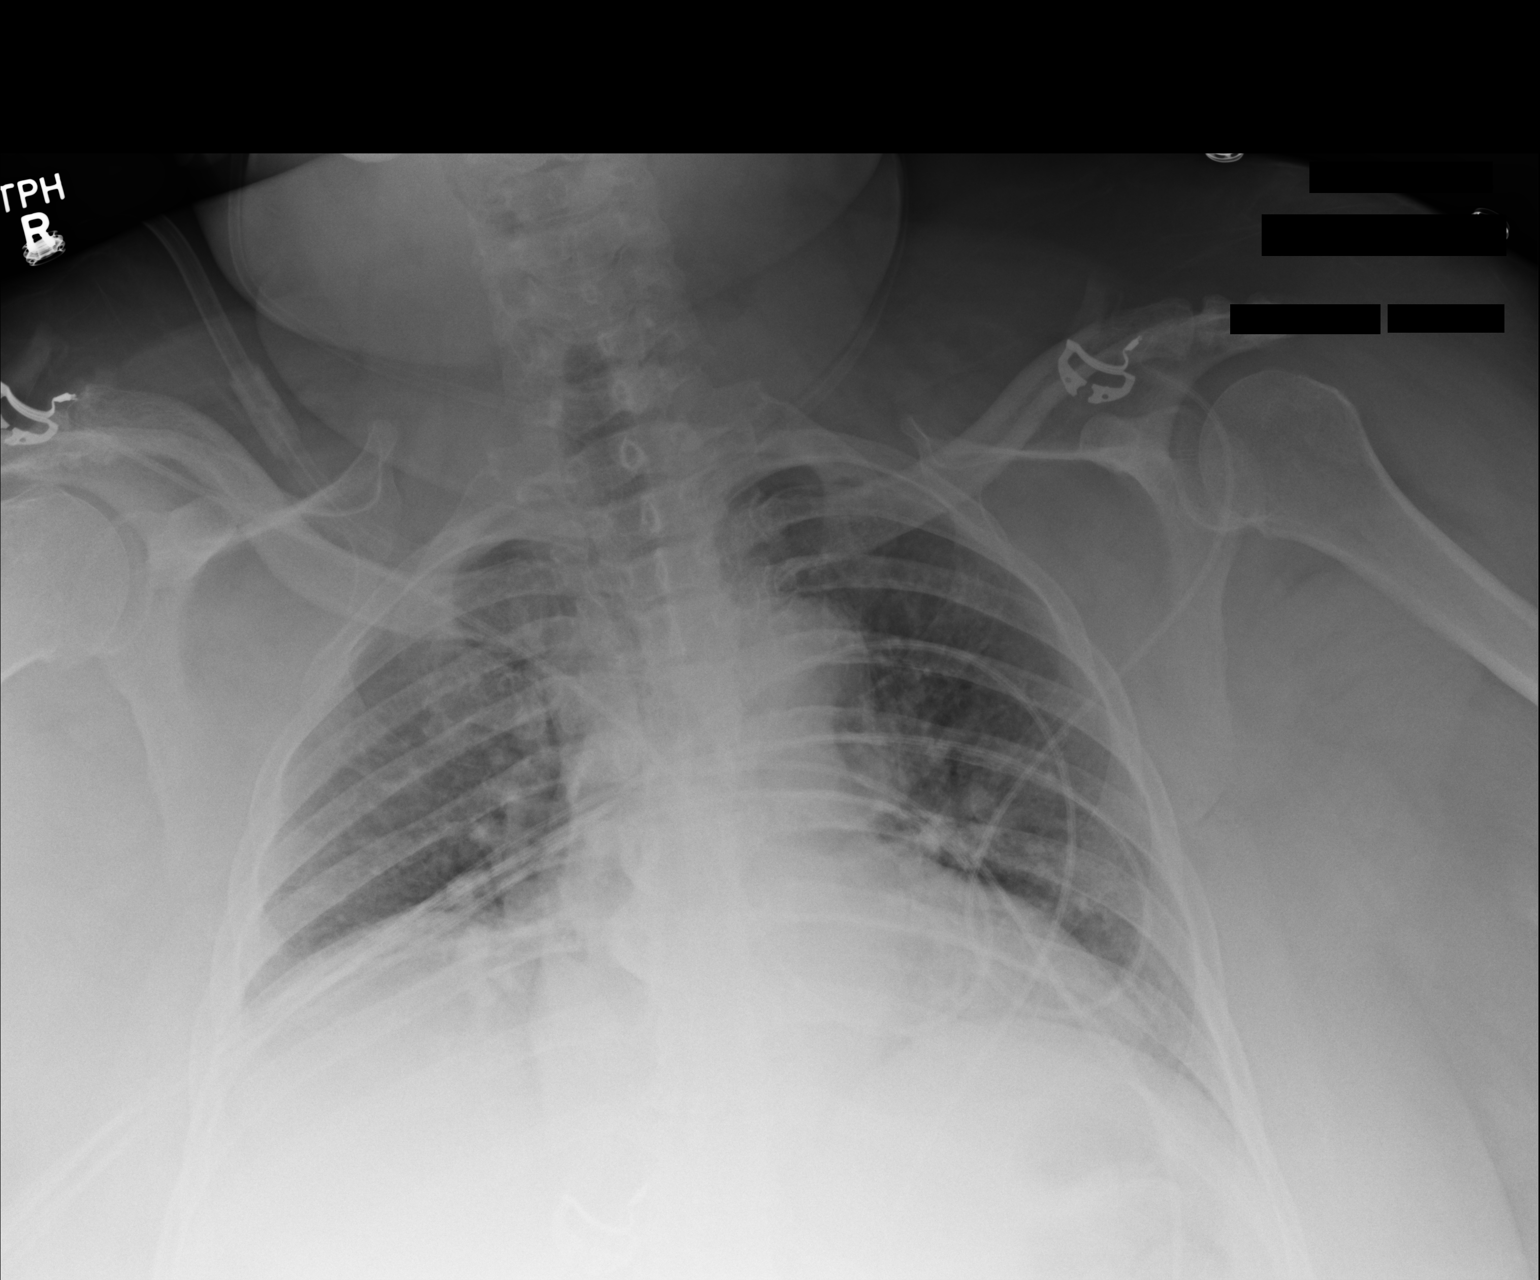

[1 of 1 positions shown; findings below may reference images not displayed]

FINDINGS: The heart size appears normal. Lung volumes are low and there is
asymmetric elevation of the right hemidiaphragm.
IMPRESSION: 1. Stable low lung volumes with asymmetric elevation of the right
hemidiaphragm.

## 2014-05-08 IMAGING — CR DG CHEST 1V PORT
1 series · 1 of 1 positions shown · non-contrast
Comparison: April 06, 2013

CLINICAL DATA: Shortness of breath and altered mental status

EXAM:
PORTABLE CHEST - 1 VIEW

[AP]
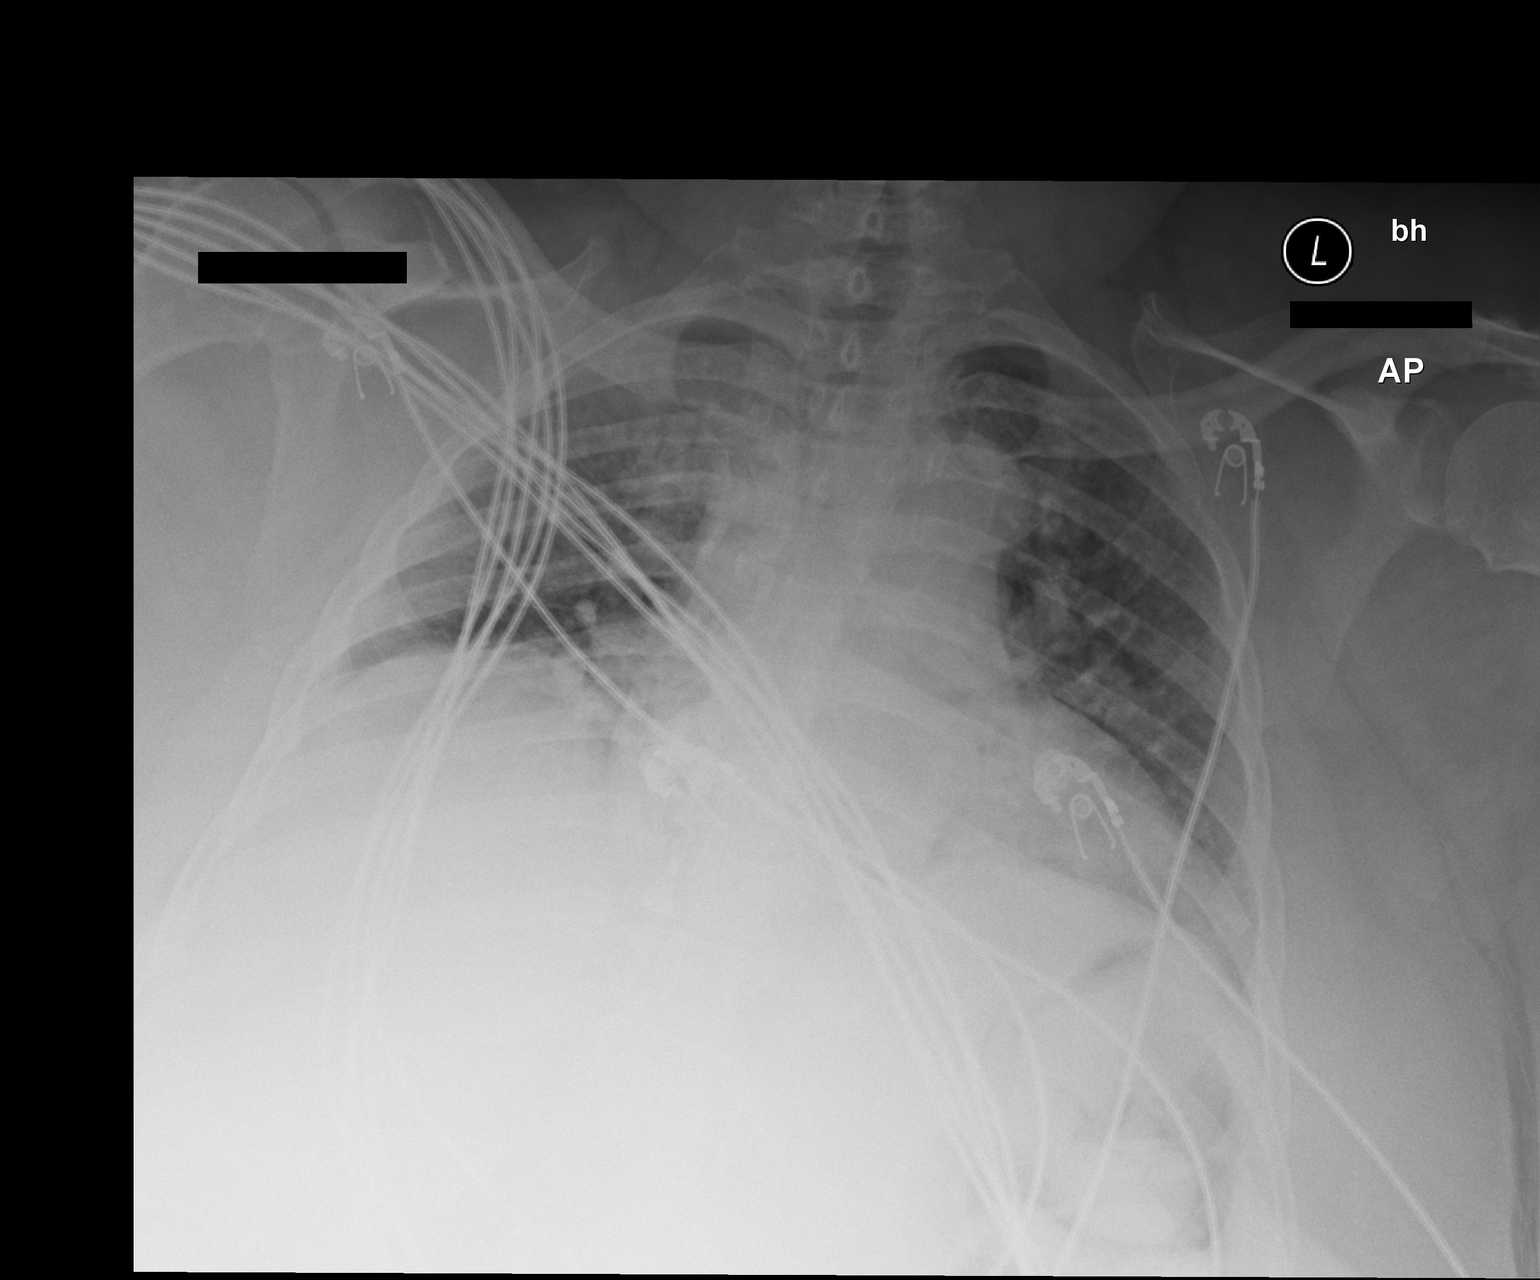

[1 of 1 positions shown; findings below may reference images not displayed]

FINDINGS: There is no edema or consolidation. Heart is mildly enlarged with
normal pulmonary vascularity. No adenopathy. No bone lesions.
IMPRESSION: No edema or consolidation.

## 2014-05-08 IMAGING — CR DG CHEST 1V PORT
1 series · 1 of 1 positions shown · non-contrast
Comparison: None.

CLINICAL DATA: hypoxia

EXAM:
PORTABLE CHEST - 1 VIEW

[AP]
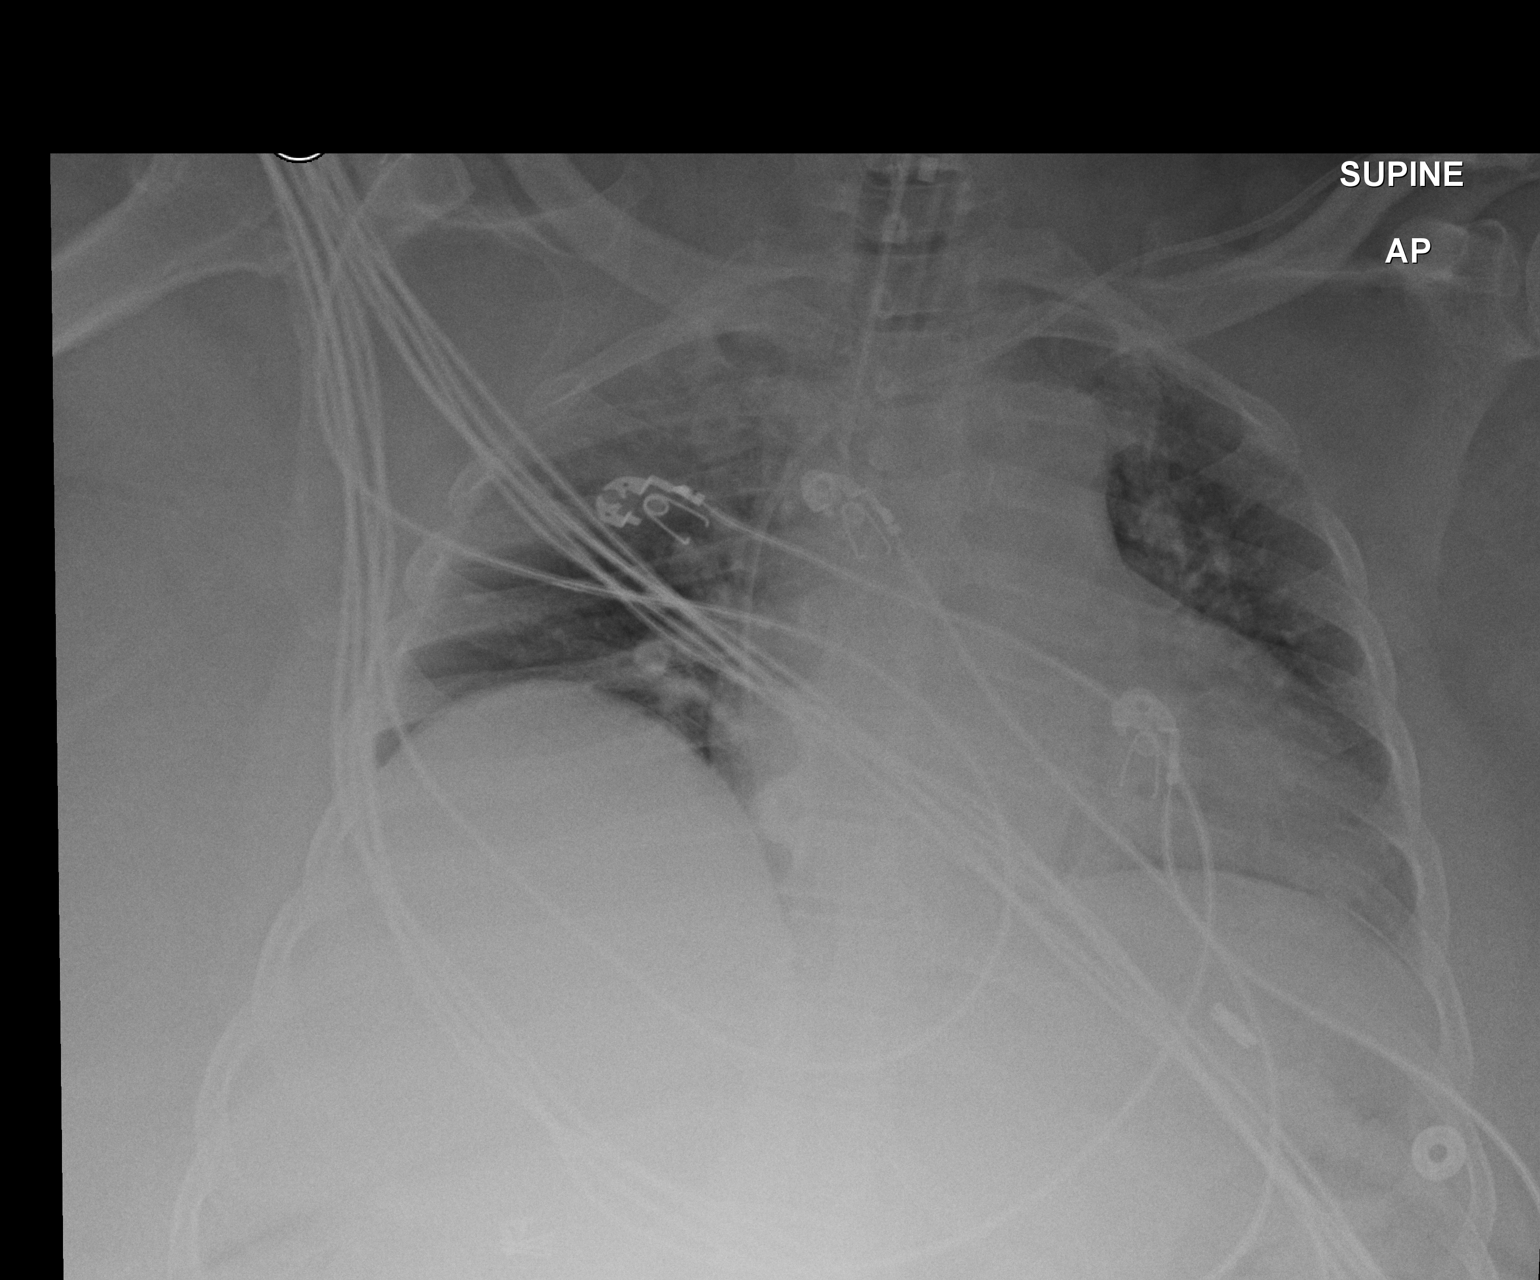

[1 of 1 positions shown; findings below may reference images not displayed]

FINDINGS: Endotracheal tube tip is 2.9 cm above the carinal. Central catheter
tip is in the right atrium. No pneumothorax.

There is no edema or consolidation. Heart is mildly enlarged with
normal pulmonary vascularity. No adenopathy. There is eventration of
the right hemidiaphragm.
IMPRESSION: Tube and catheter positions as described. No pneumothorax. No edema
or consolidation.

## 2014-05-09 IMAGING — CR DG CHEST 1V PORT
1 series · 1 of 1 positions shown · non-contrast
Comparison: 04/30/2013.

CLINICAL DATA: Shortness of breath. Intubated.

EXAM:
PORTABLE CHEST - 1 VIEW

[AP]
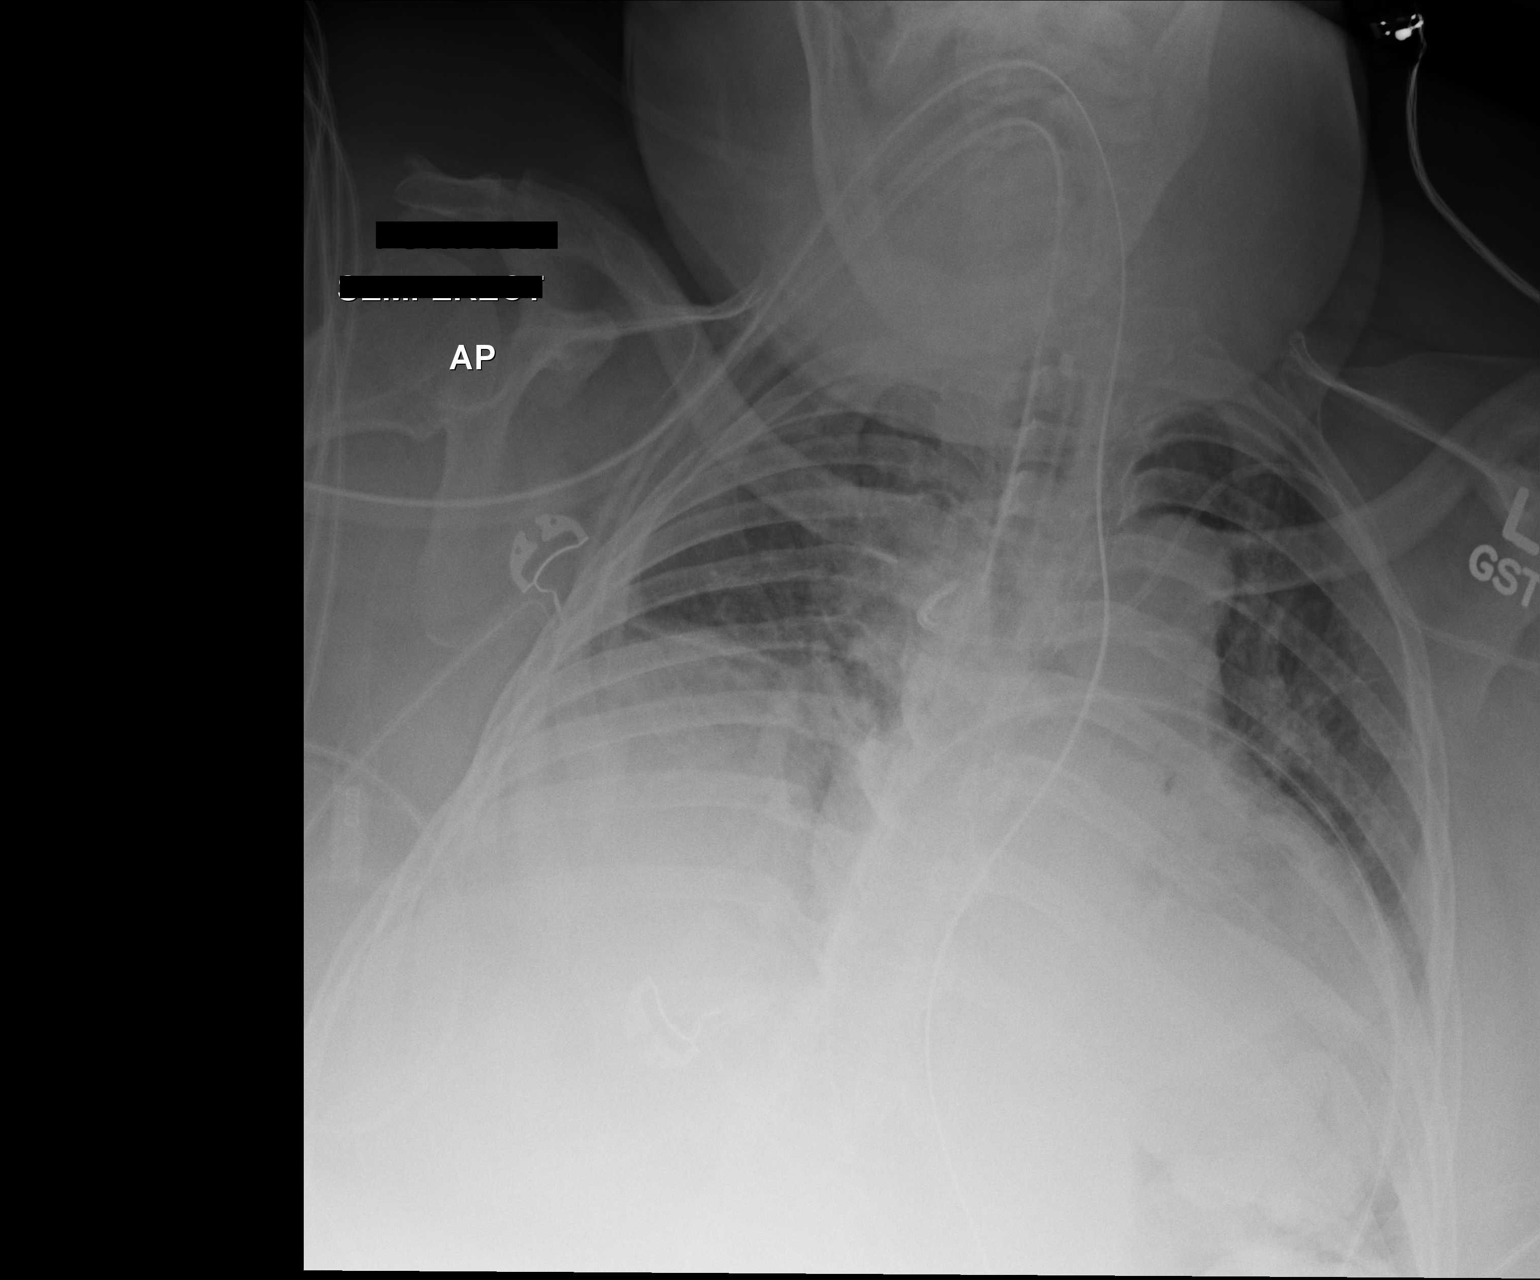

[1 of 1 positions shown; findings below may reference images not displayed]

FINDINGS: The endotracheal tube is 2.5 cm above the carinal. The left sided
central venous catheter tip has flipped into the azygos vein and
should be repositioned. The nasogastric tube tip is in the stomach.

Very low lung volumes with vascular crowding and atelectasis.
Persistent elevation of the right hemidiaphragm without definite
pleural effusion.
IMPRESSION: The left-sided central venous catheter tip is now in the region of
the azygos vein and should be repositioned.

The endotracheal tube and NG tubes are stable.

Worsening lung aeration in part due to lower lung volumes with
worsening vascular congestion and atelectasis.

## 2014-05-10 IMAGING — CR DG CHEST 1V PORT
1 series · 1 of 1 positions shown · non-contrast
Comparison: 05/01/2013

CLINICAL DATA: Ventilator dependent respiratory failure. Acute
kidney injury.

EXAM:
PORTABLE CHEST - 1 VIEW

[AP]
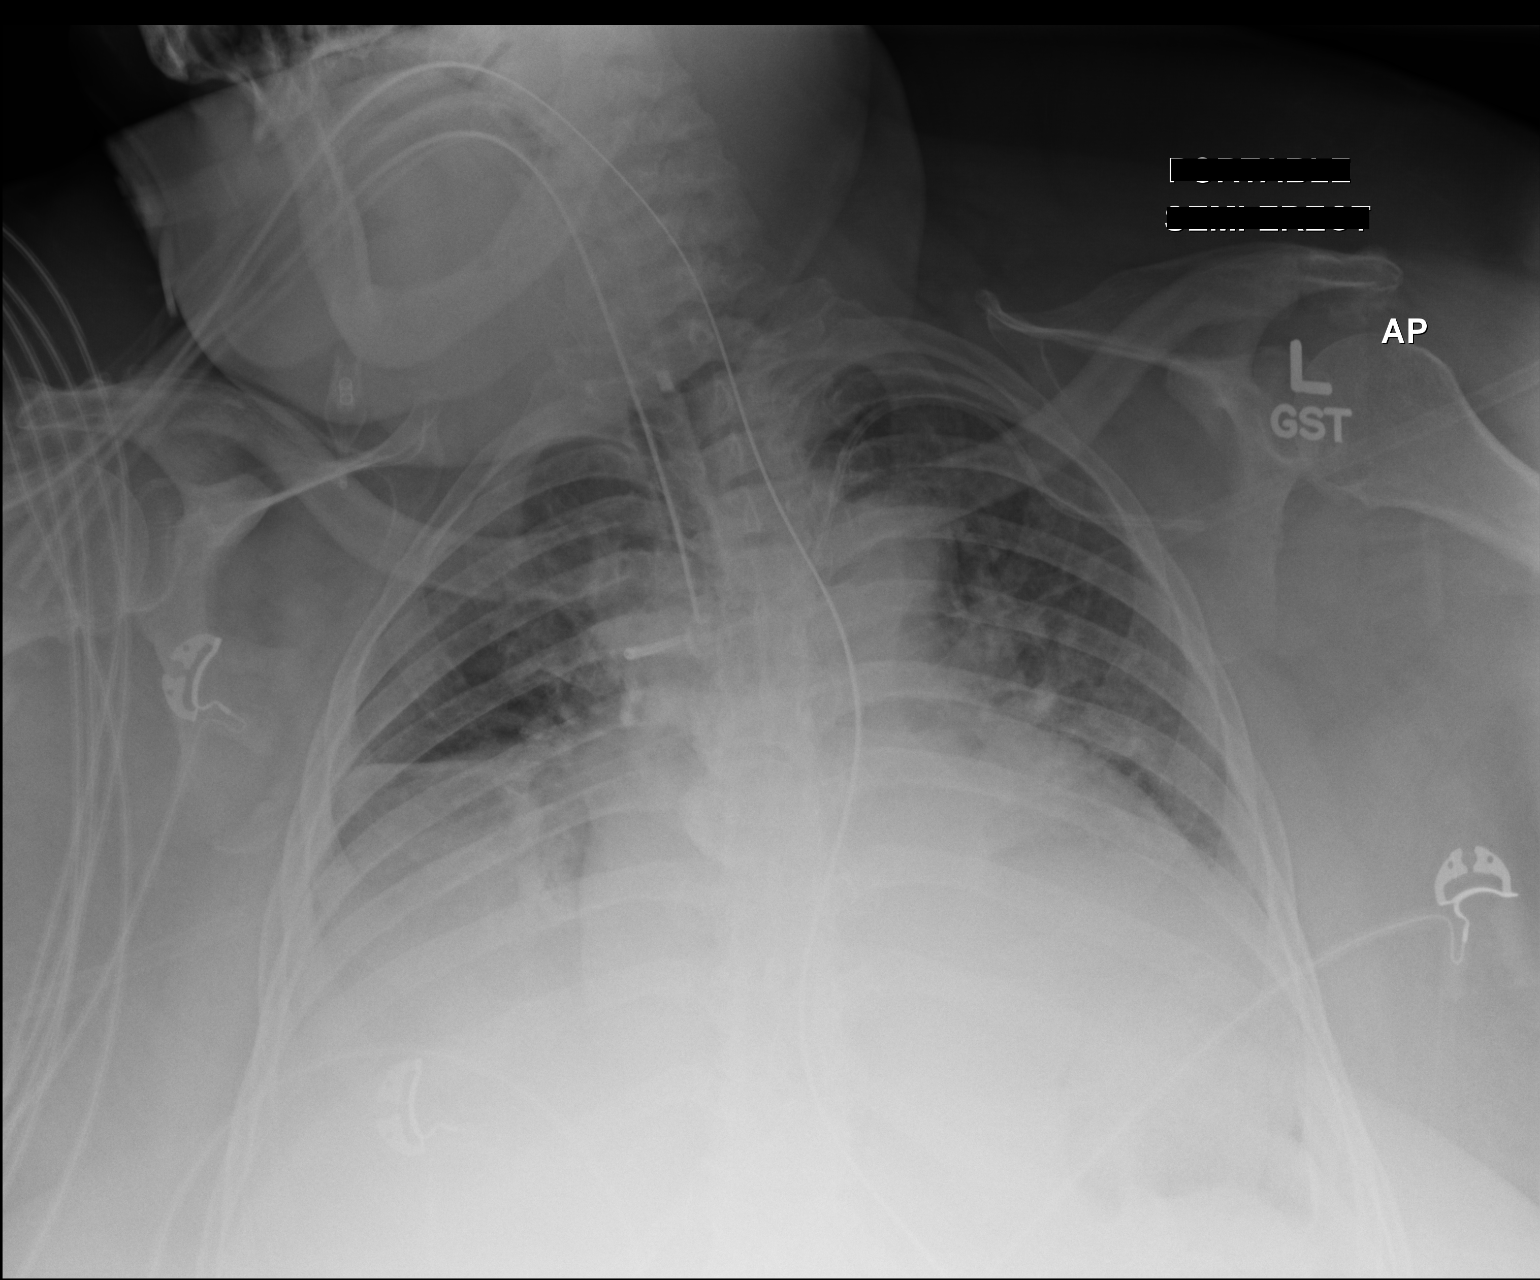

[1 of 1 positions shown; findings below may reference images not displayed]

FINDINGS: Endotracheal tube and nasogastric tube remain in appropriate
position. The left-sided central venous catheter is again seen with
the tip curved into the azygos vein.

Low lung volumes and bibasilar atelectasis or infiltrates show no
significant change. Cardiomegaly is stable.
IMPRESSION: Left-sided central venous catheter tip is again seen curving into
the azygos vein.

No significant change in low lung volumes and bibasilar atelectasis
or infiltrates.

## 2014-05-11 IMAGING — CR DG CHEST 1V PORT
1 series · 1 of 1 positions shown · non-contrast
Comparison: Prior chest x-ray 05/02/2013

CLINICAL DATA: Endotracheal tube position and

EXAM:
PORTABLE CHEST - 1 VIEW

[AP]
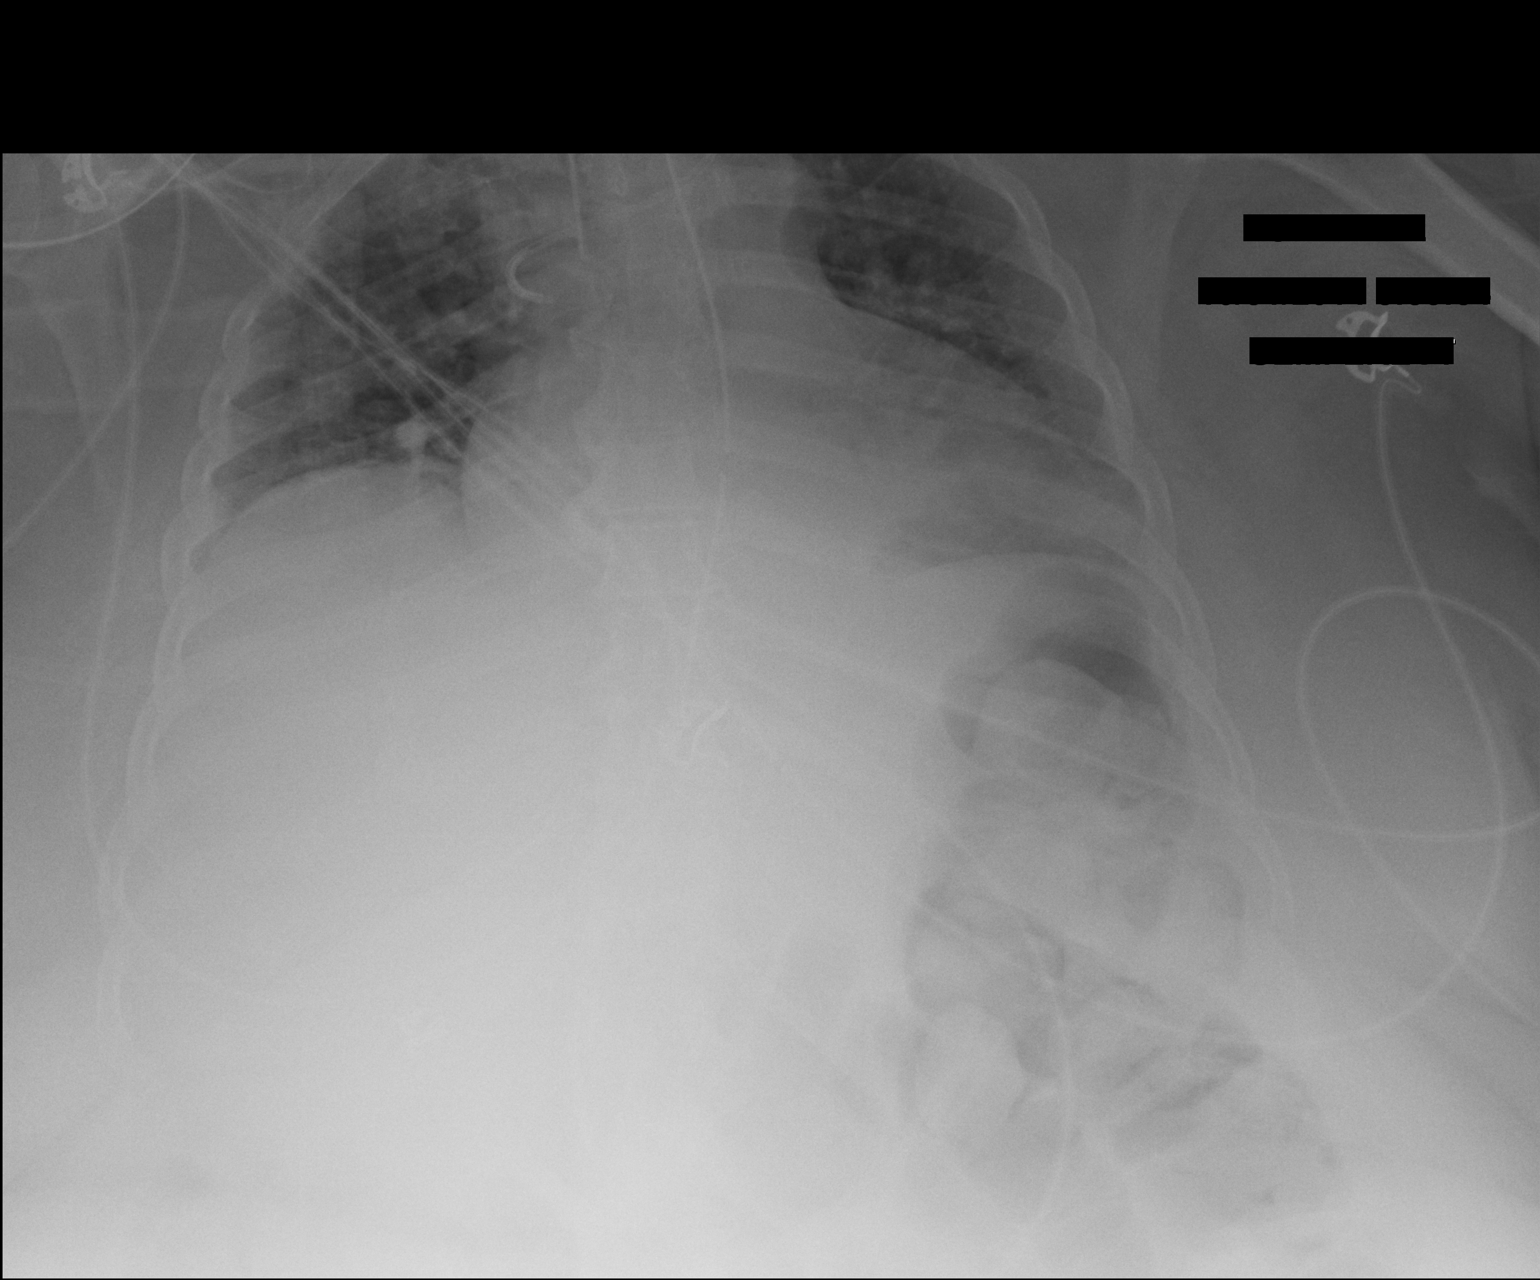

[1 of 1 positions shown; findings below may reference images not displayed]

FINDINGS: The endotracheal tube is 1.5 cm above the carinal. A left subclavian
central venous catheter is present. The tip curves and is likely
within the azygos vein. A the tip of the nasogastric tube has pulled
back and is now at the GE junction. Inspiratory volumes are very
low. There has been some interval clearing of bibasilar atelectasis.
Pulmonary vascular congestion persists.
IMPRESSION: 1. The nasogastric tube has pulled back. The tip is at the GE
junction. If the intent is to decompress the stomach, recommend
advancing 10 cm.
2. The tip of the left subclavian central venous catheter is likely
within the azygos vein.
3. The endotracheal tube is 1.5 cm above the carinal.
4. Persistent very low inspiratory volumes with slightly improved
bibasilar atelectasis.

## 2014-05-12 IMAGING — CR DG ABD PORTABLE 1V
1 series · 1 of 1 positions shown · non-contrast
Comparison: 07/03/2007

CLINICAL DATA: Nasogastric tube placement

EXAM:
PORTABLE ABDOMEN - 1 VIEW

[AP]
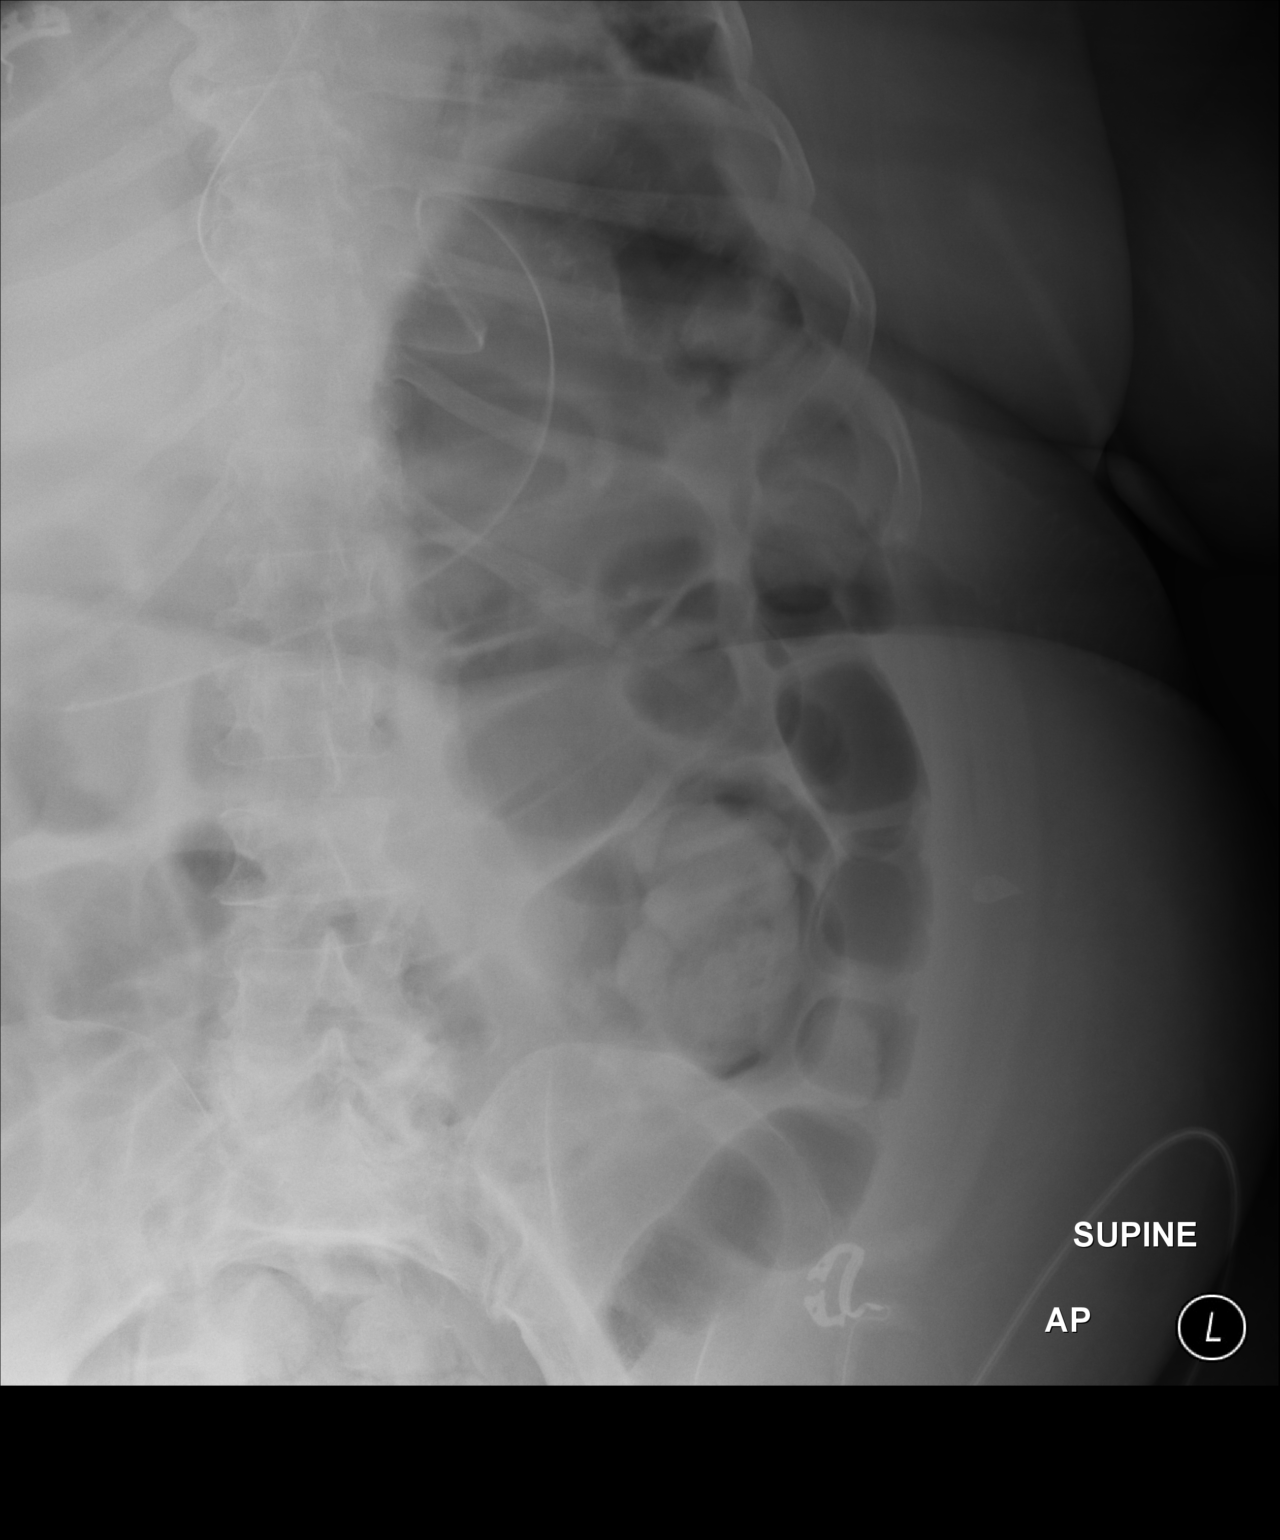

[1 of 1 positions shown; findings below may reference images not displayed]

FINDINGS: Abdomen incompletely included in the field of view. Nasogastric tube
tip terminates over the distal stomach/ duodenum bulb. No visualized
loops of dilated bowel. Presence or absence of air-fluid levels or
free air cannot be assessed on this projection.
IMPRESSION: Nasogastric tube tip terminates over the distal stomach/ duodenum
bulb.

## 2014-05-12 IMAGING — CR DG ABD PORTABLE 1V
1 series · 1 of 1 positions shown · non-contrast
Comparison: Portable exam 8668 hr compared to 05/04/2013 at 8081 hr

CLINICAL DATA: Nasogastric tube placement

EXAM:
PORTABLE ABDOMEN - 1 VIEW

[AP]
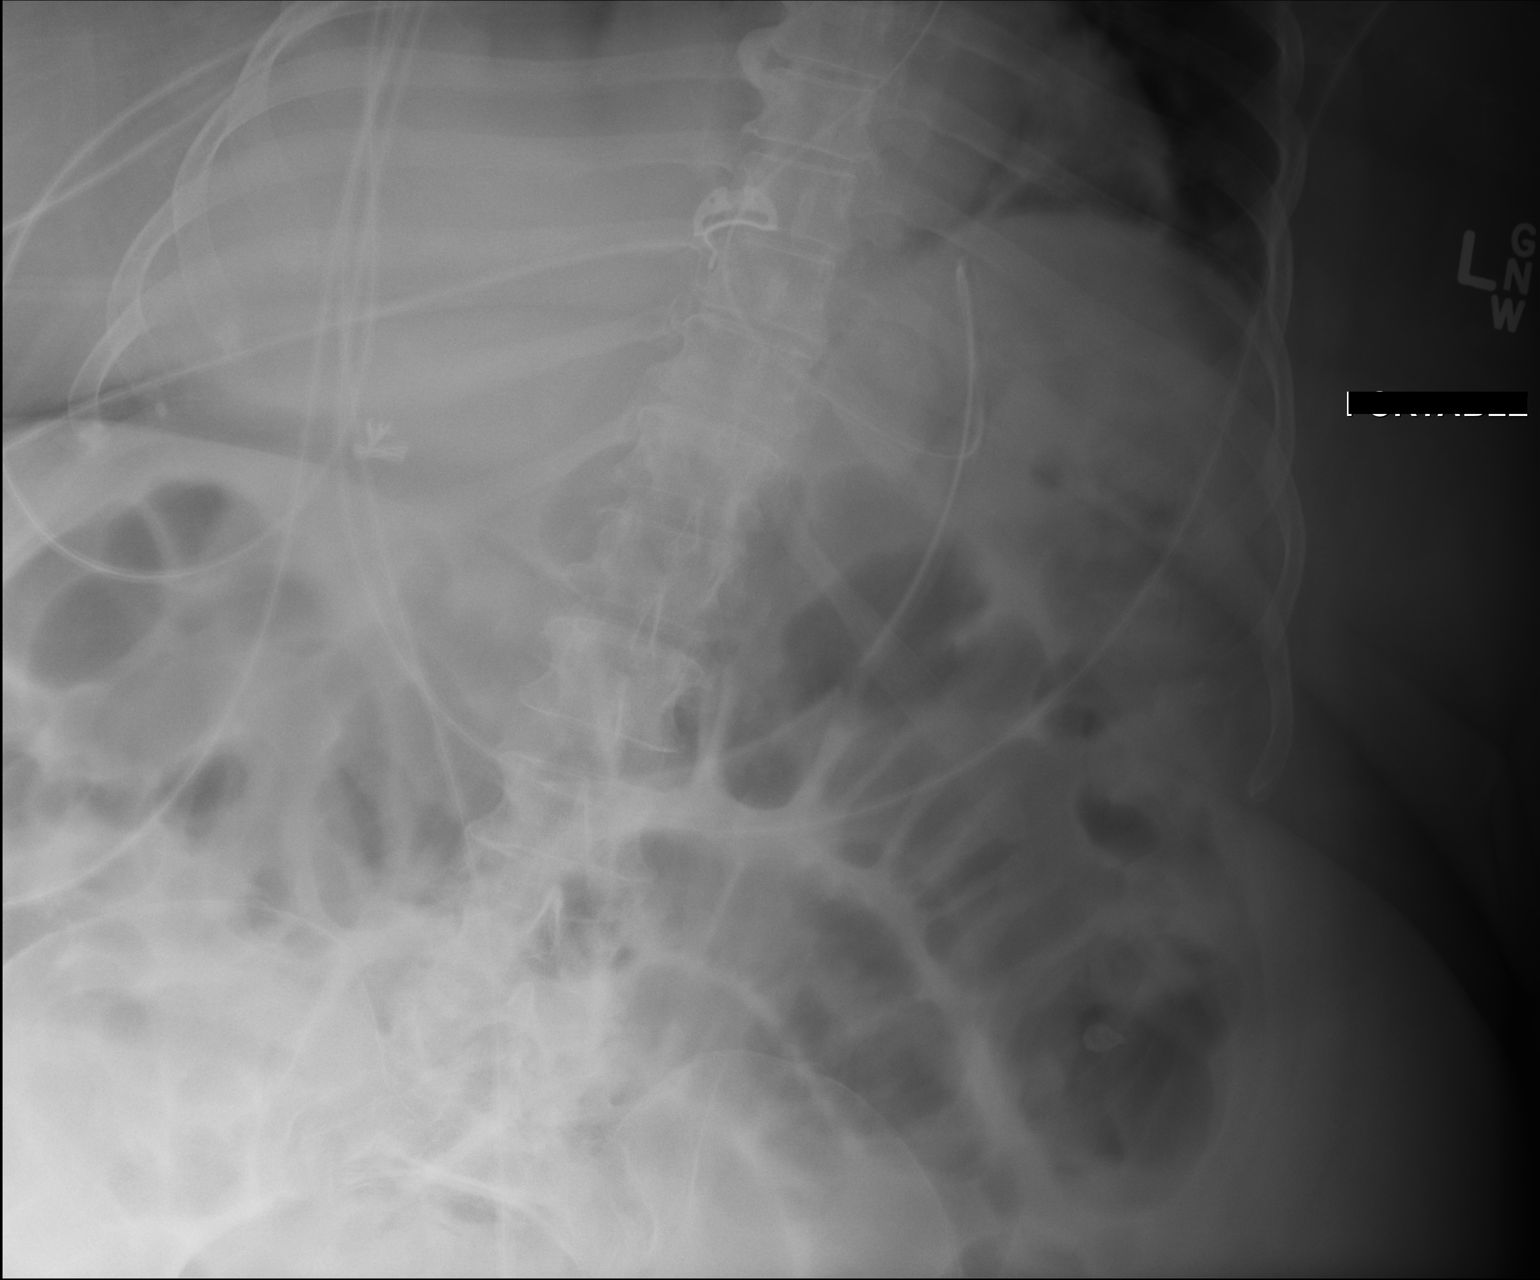

[1 of 1 positions shown; findings below may reference images not displayed]

FINDINGS: Nasogastric tube looped in proximal stomach with tip projecting over
antrum.

EKG leads project over abdomen.

Nonobstructive bowel gas pattern with slight gaseous distention of
the transverse colon noted.

Atelectasis versus consolidation at lung bases.
IMPRESSION: Nasogastric tube is looped at the proximal stomach with the tip
projecting over the gastric antrum.

## 2014-05-13 IMAGING — CR DG CHEST 1V PORT
1 series · 1 of 1 positions shown · non-contrast
Comparison: 05/03/2013

CLINICAL DATA: Tracheostomy placement.

EXAM:
PORTABLE CHEST - 1 VIEW

[AP]
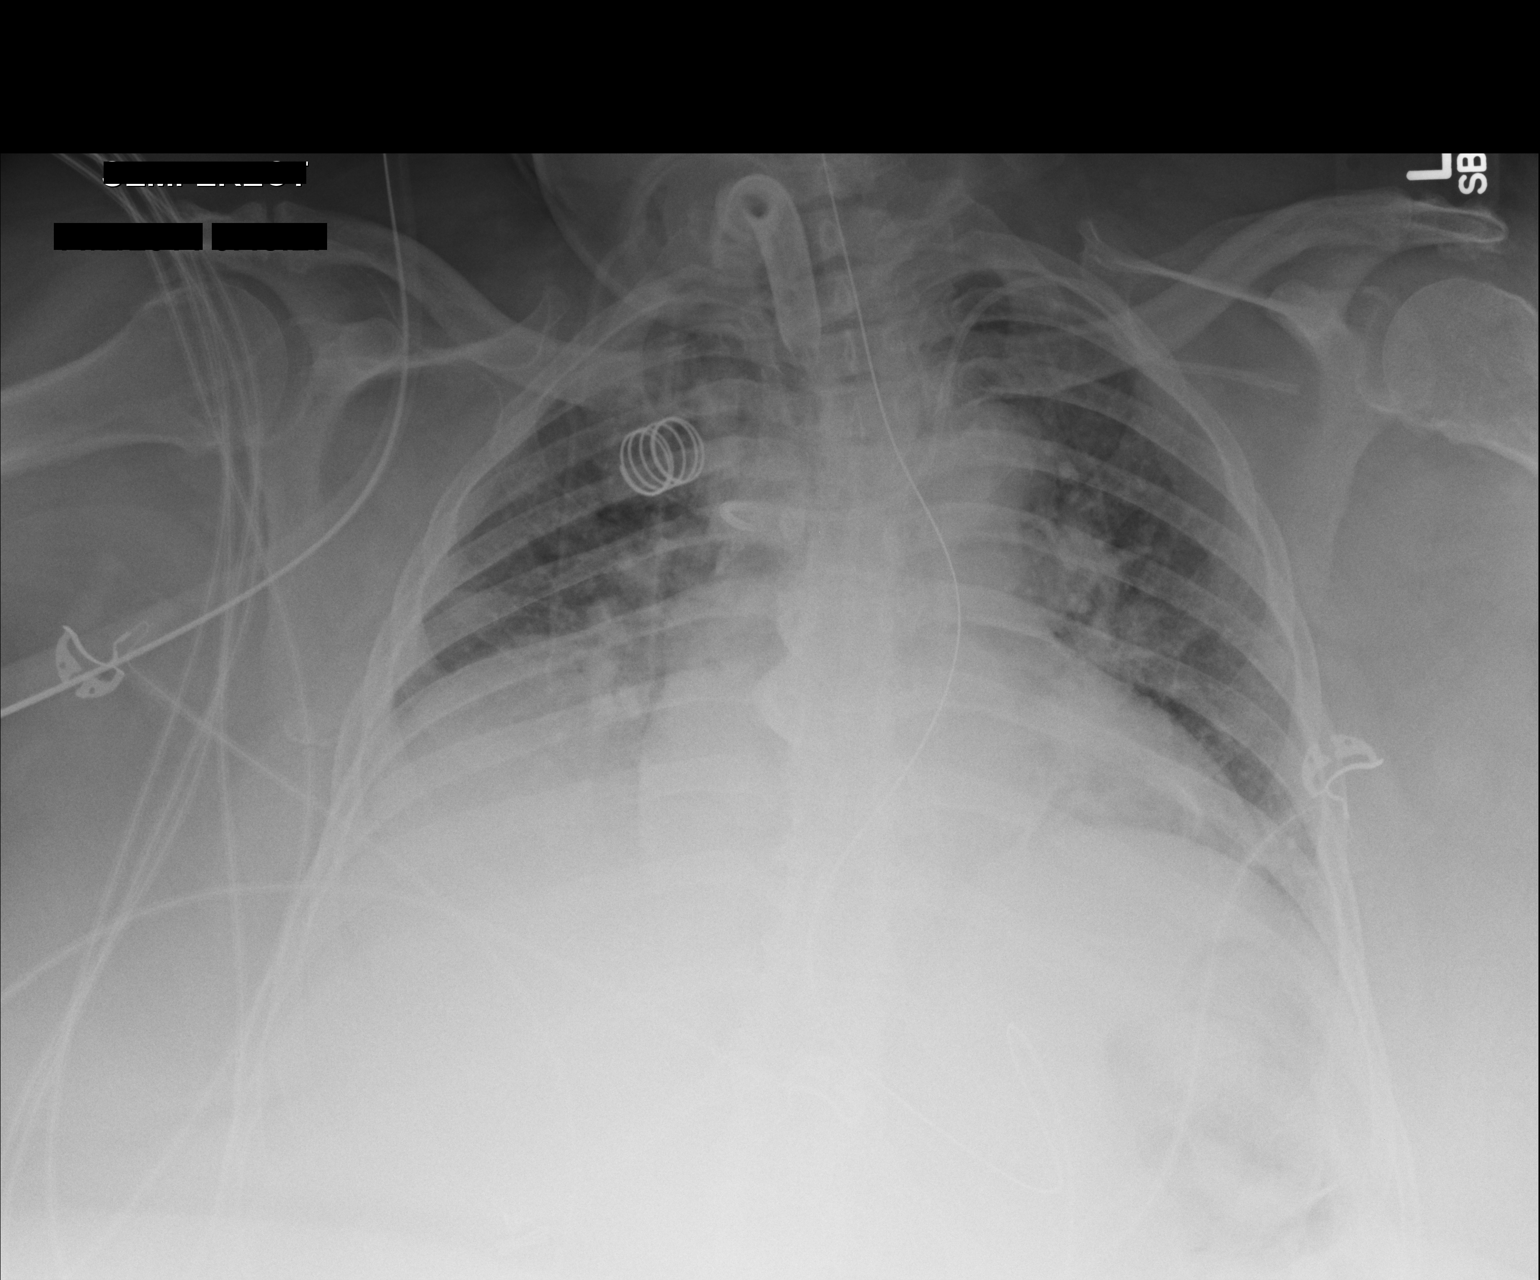

[1 of 1 positions shown; findings below may reference images not displayed]

FINDINGS: Endotracheal tube has been removed and the patient now has a
tracheostomy tube which appears to be appropriately positioned.
Again noted is a left jugular central venous catheter. The central
line tip is curved and may be extending into the azygos vein. This
catheter tip position is similar to the prior examination.
Nasogastric tube extends into the abdomen. Again noted are low lung
volumes with elevation of the right hemidiaphragm. Difficult to
exclude mild edema. Heart size is grossly stable. Negative for a
pneumothorax.
IMPRESSION: Placement of tracheostomy tube.

Central line location as described. The tip is probably extending
into the azygos vein.

Low lung volumes and cannot exclude mild edema.

## 2014-05-23 IMAGING — CR DG CHEST 1V PORT
1 series · 1 of 1 positions shown · non-contrast
Comparison: Chest radiograph performed 05/05/2013

CLINICAL DATA: Shortness of breath.

EXAM:
PORTABLE CHEST - 1 VIEW

[AP]
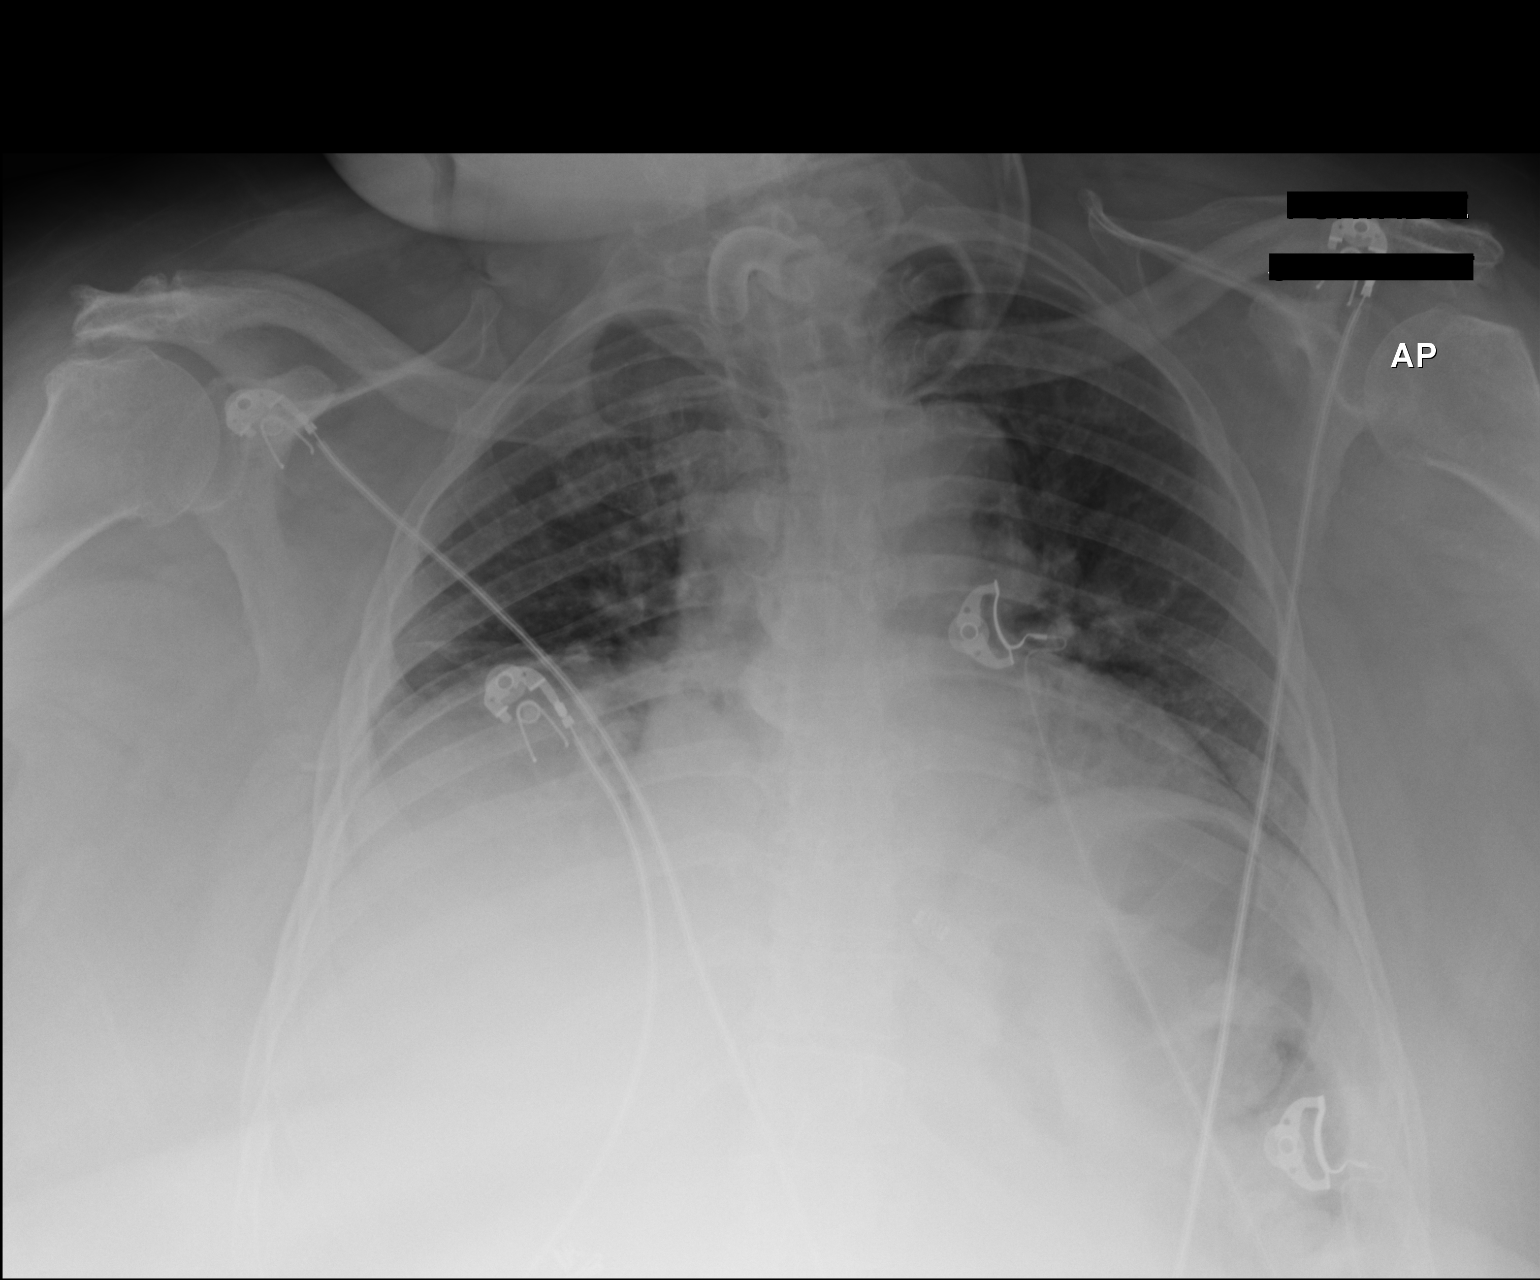

[1 of 1 positions shown; findings below may reference images not displayed]

FINDINGS: The patient's tracheostomy tube is seen ending 6-7 cm above the
carina.

The lungs remain hypoexpanded. Vascular crowding is noted. Mild
bibasilar atelectasis is seen; previously noted airspace opacities
have resolved. No definite pleural effusion or pneumothorax is
identified.

The cardiomediastinal silhouette is mildly enlarged. No acute
osseous abnormalities are seen.
IMPRESSION: Lungs remain hypoexpanded; mild bibasilar atelectasis noted.
Previously noted bilateral airspace opacities have resolved. Mild
cardiomegaly noted.

## 2014-05-27 IMAGING — CR DG CHEST 1V PORT
1 series · 1 of 1 positions shown · non-contrast
Comparison: 05/15/2013, 05/05/2013, 05/03/2013, 04/05/2013.

CLINICAL DATA: Bronchitis versus pneumonia

EXAM:
PORTABLE CHEST - 1 VIEW

[AP]
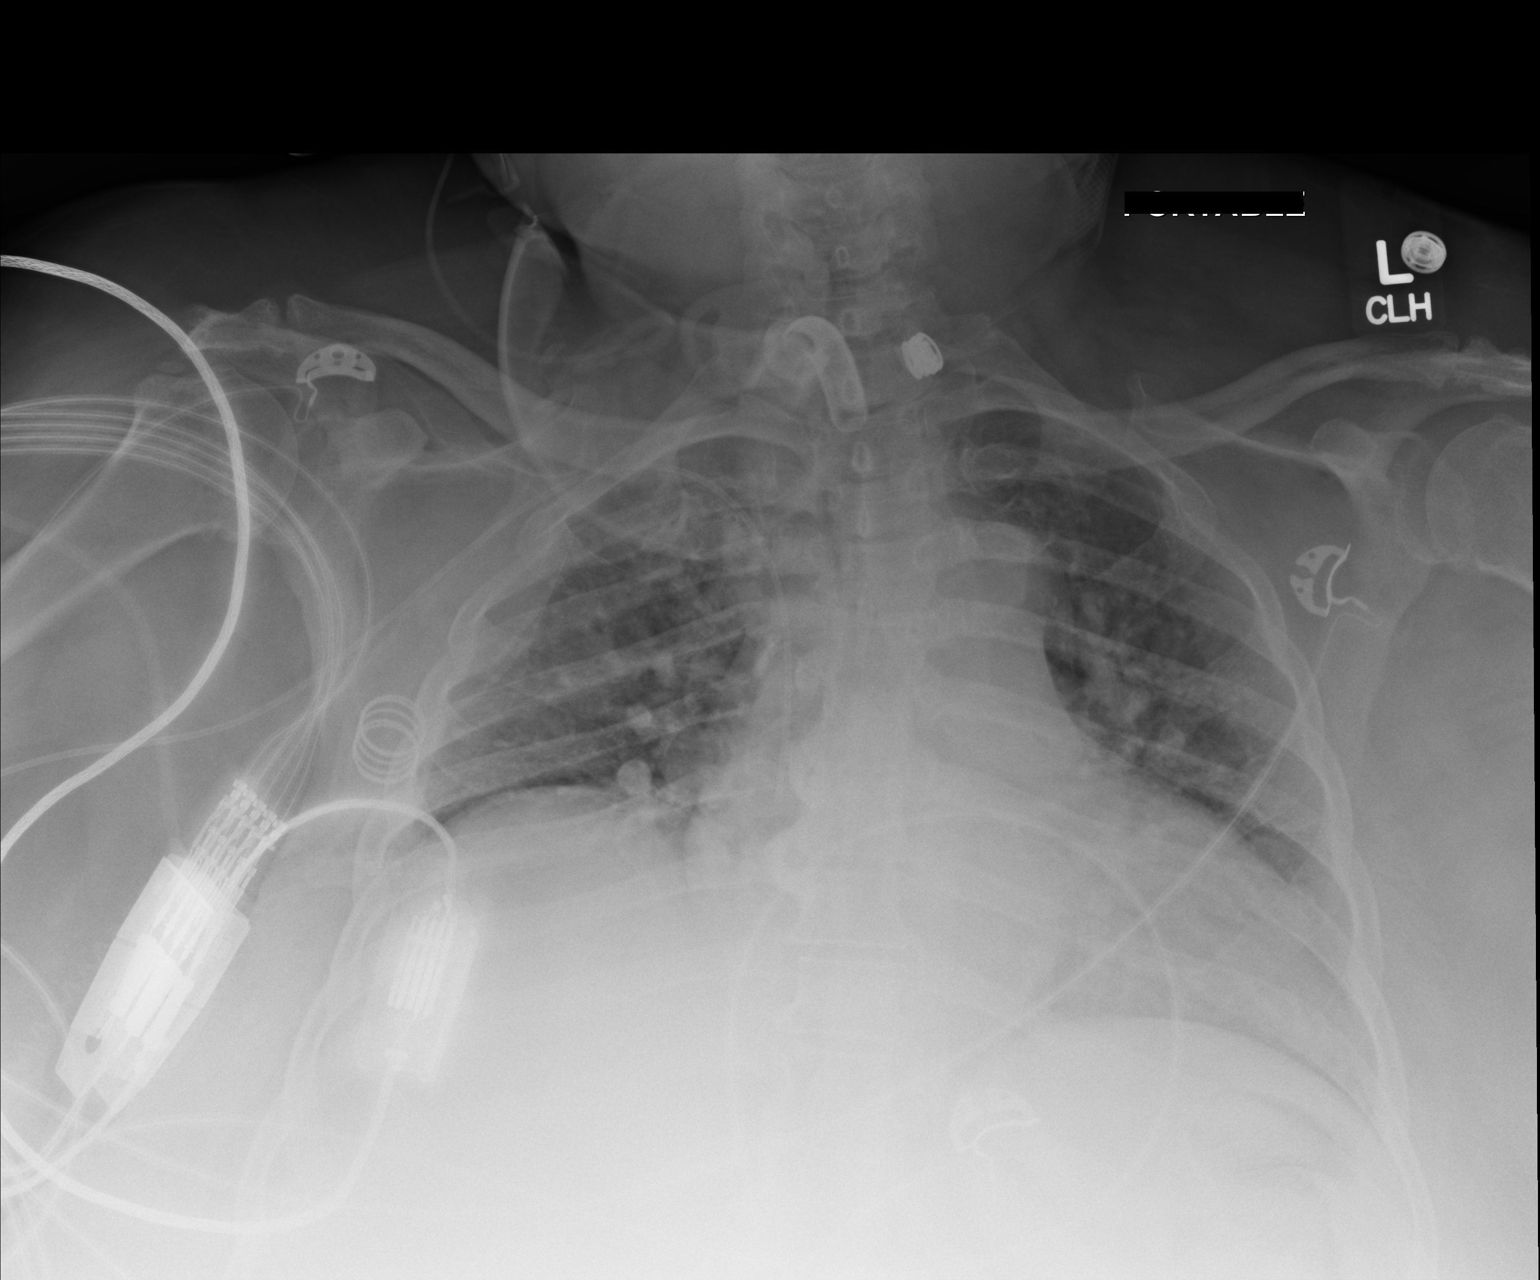

[1 of 1 positions shown; findings below may reference images not displayed]

FINDINGS: There is a tracheostomy tube in unchanged, satisfactory position.
There is a right-sided PICC line with the tip projecting over the
cavoatrial junction in satisfactory position.

The heart and mediastinum are stable. There is elevation of the
right diaphragm. There are low lung volumes. There is prominence of
these interstitial markings bilaterally likely secondary to low lung
volumes. There is no focal consolidation, pleural effusion or
pneumothorax.

There are mild degenerative changes of the right glenohumeral joint
and bilateral acromioclavicular joints.
IMPRESSION: No focal consolidation to suggest lobar pneumonia. Hypoexpanded
lungs accounting for prominence of bilateral interstitial
prominence. No significant interval change compared with 05/15/2013.

## 2014-06-09 IMAGING — CR DG CHEST 1V PORT
1 series · 1 of 1 positions shown · non-contrast
Comparison: 05/19/2013

CLINICAL DATA: Evaluate trach placement, evaluate for pneumothorax

EXAM:
PORTABLE CHEST - 1 VIEW

[AP]
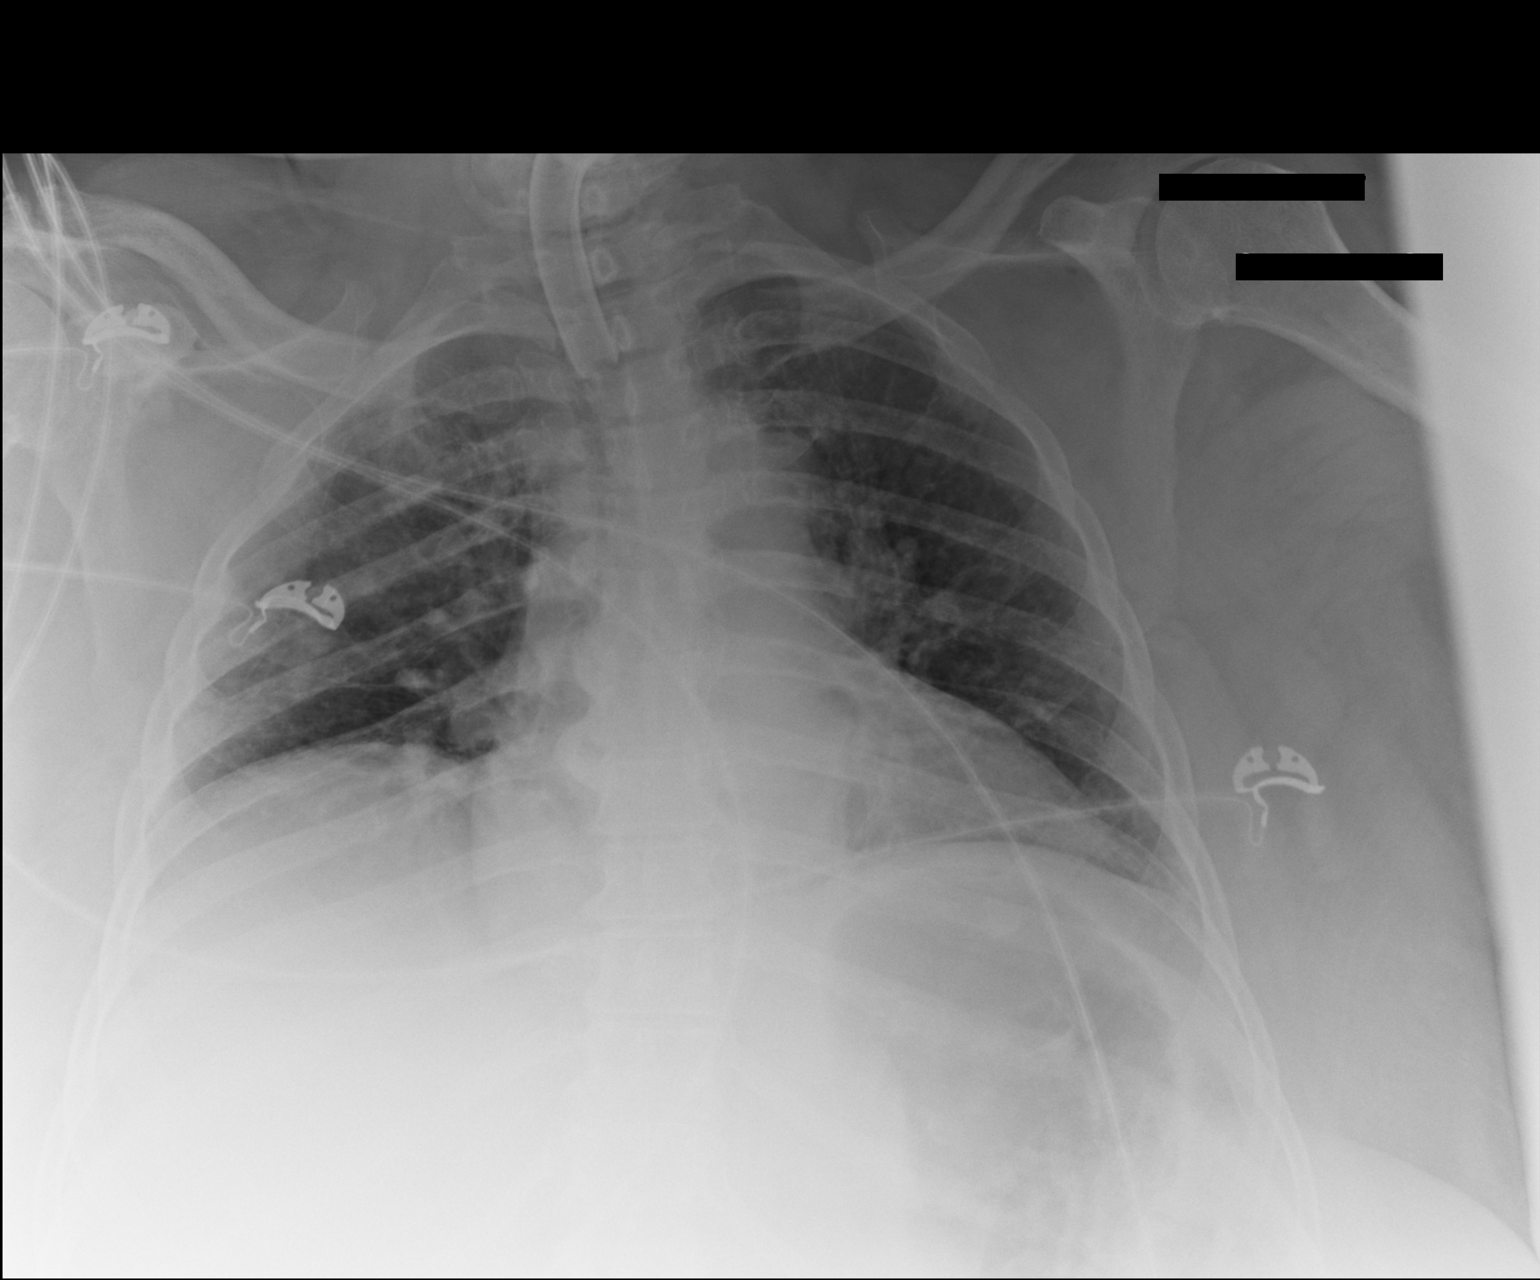

[1 of 1 positions shown; findings below may reference images not displayed]

FINDINGS: Tracheostomy in satisfactory position, terminating at the thoracic
inlet.

Low lung volumes. No focal consolidation. No pleural effusion or
pneumothorax.

Mild cardiomegaly.

Interval removal of prior right arm PICC.
IMPRESSION: Tracheostomy in satisfactory position.

No pneumothorax.

## 2014-06-10 IMAGING — CR DG CHEST 1V PORT
1 series · 1 of 1 positions shown · non-contrast
Comparison: 8719 hr the same day and earlier.

CLINICAL DATA: 62-year-old female tracheostomy placement. Initial
encounter.

EXAM:
PORTABLE CHEST - 1 VIEW

[AP]
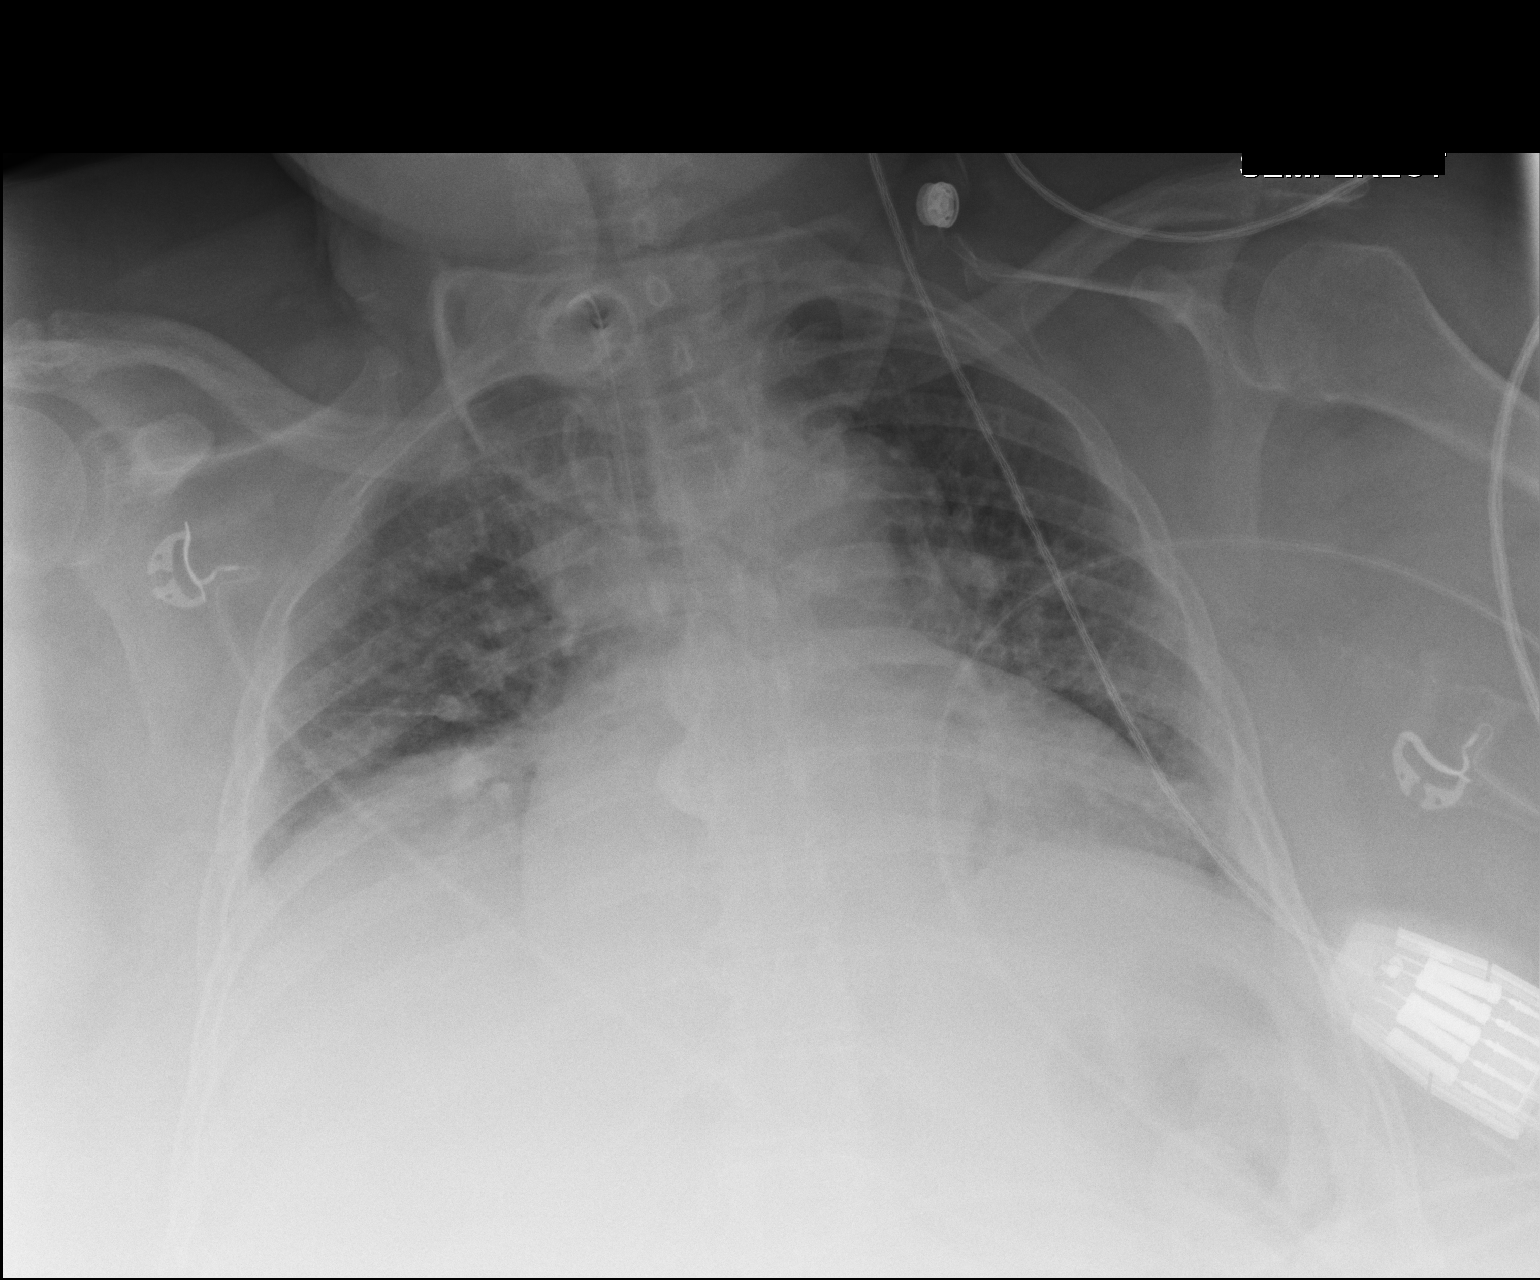

[1 of 1 positions shown; findings below may reference images not displayed]

FINDINGS: Portable AP semi upright view at 1904 hrs. Tracheostomy tube
projects over the tracheal air column. Continued low lung volumes
with elevated right hemidiaphragm. Stable cardiac size and
mediastinal contours. Mildly increased interstitial opacity
compatible vascular congestion. No overt edema, pneumothorax or
definite effusion.
IMPRESSION: 1. Tracheostomy tube with no adverse features.

2. Continued low lung volumes. Increased pulmonary vascular
congestion.

## 2014-06-10 IMAGING — CR DG CHEST 1V PORT SAME DAY
1 series · 1 of 1 positions shown · non-contrast
Comparison: the previous day's study

CLINICAL DATA: Tracheostomy

EXAM:
PORTABLE CHEST - 1 VIEW SAME DAY

[AP]
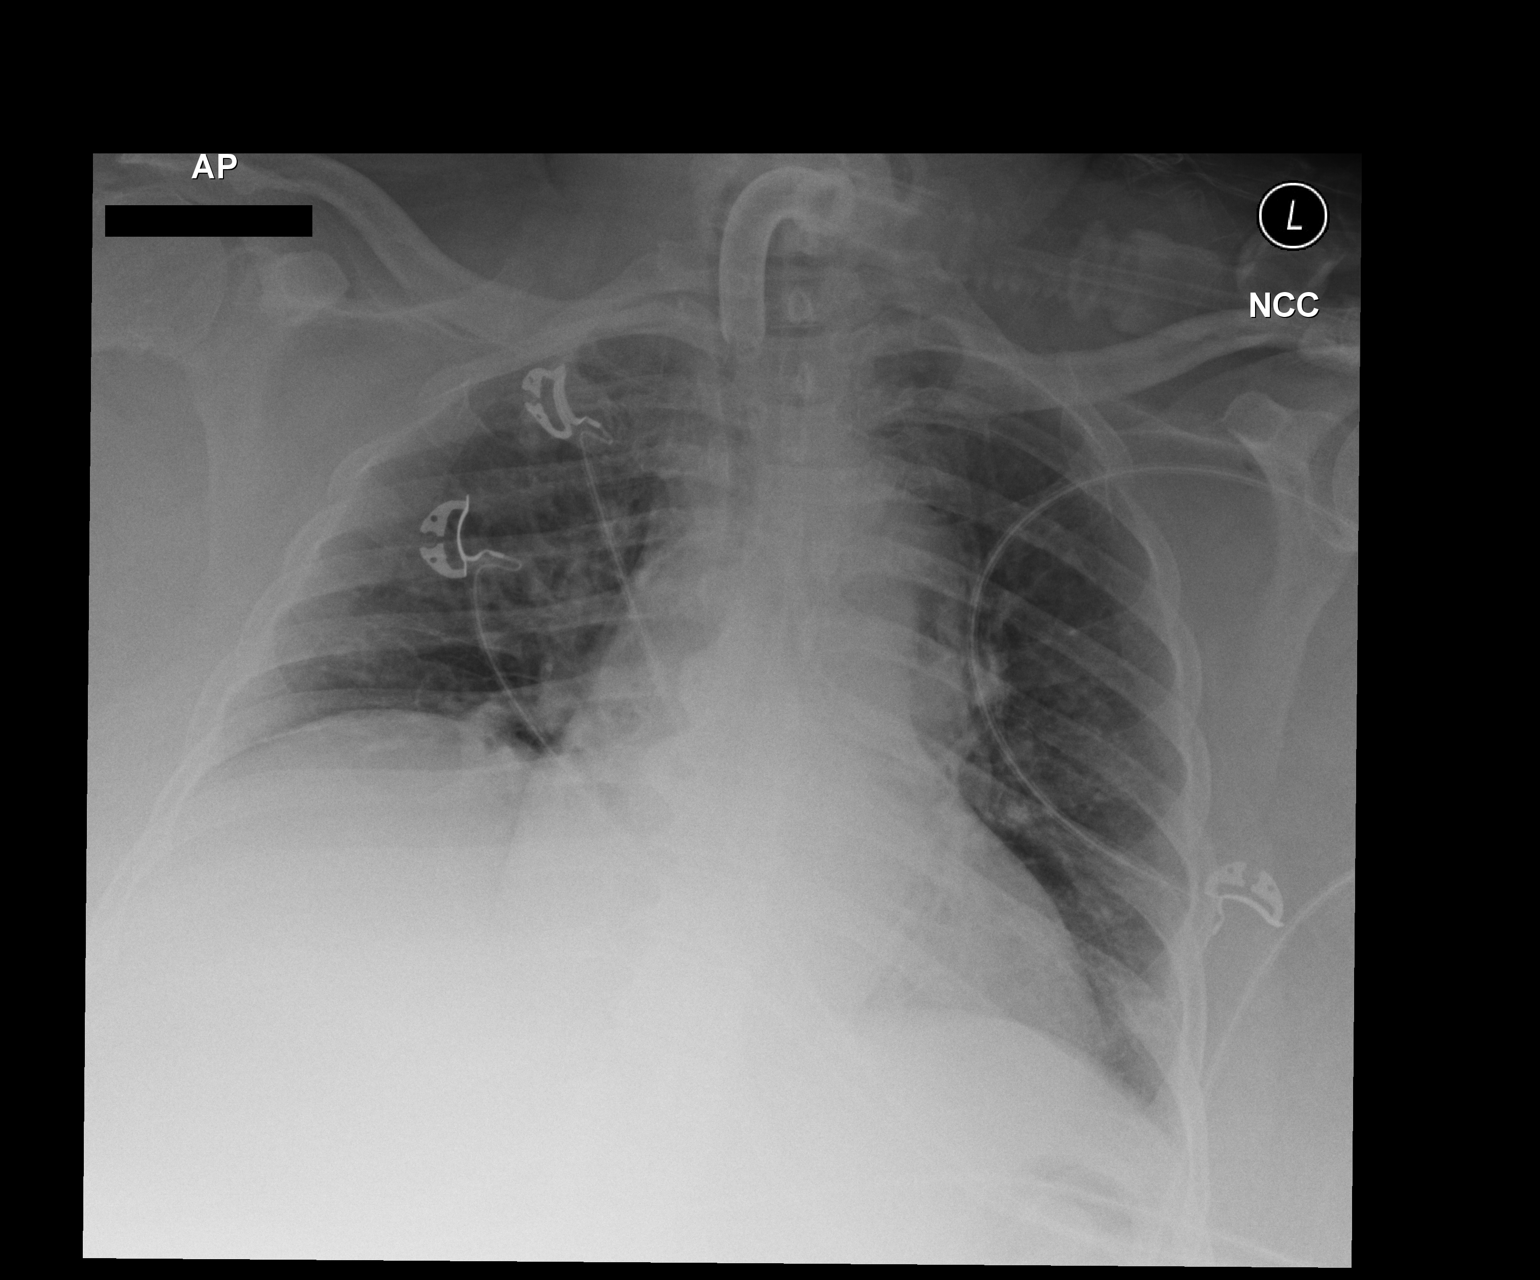

[1 of 1 positions shown; findings below may reference images not displayed]

FINDINGS: Tracheostomy device stable in position. Relatively low lung volumes
with no focal consolidation or overt edema. No effusion evident.
Heart size upper limits normal for technique and degree of
inspiration. Tortuous aorta. Spurring in the mid thoracic spine.
IMPRESSION: 1. Stable appearance since previous day's portable exam.

## 2014-07-09 IMAGING — CR DG CHEST 1V PORT
1 series · 1 of 1 positions shown · non-contrast
Comparison: 06/02/2013

CLINICAL DATA: Shortness of breath

EXAM:
PORTABLE CHEST - 1 VIEW

[AP]
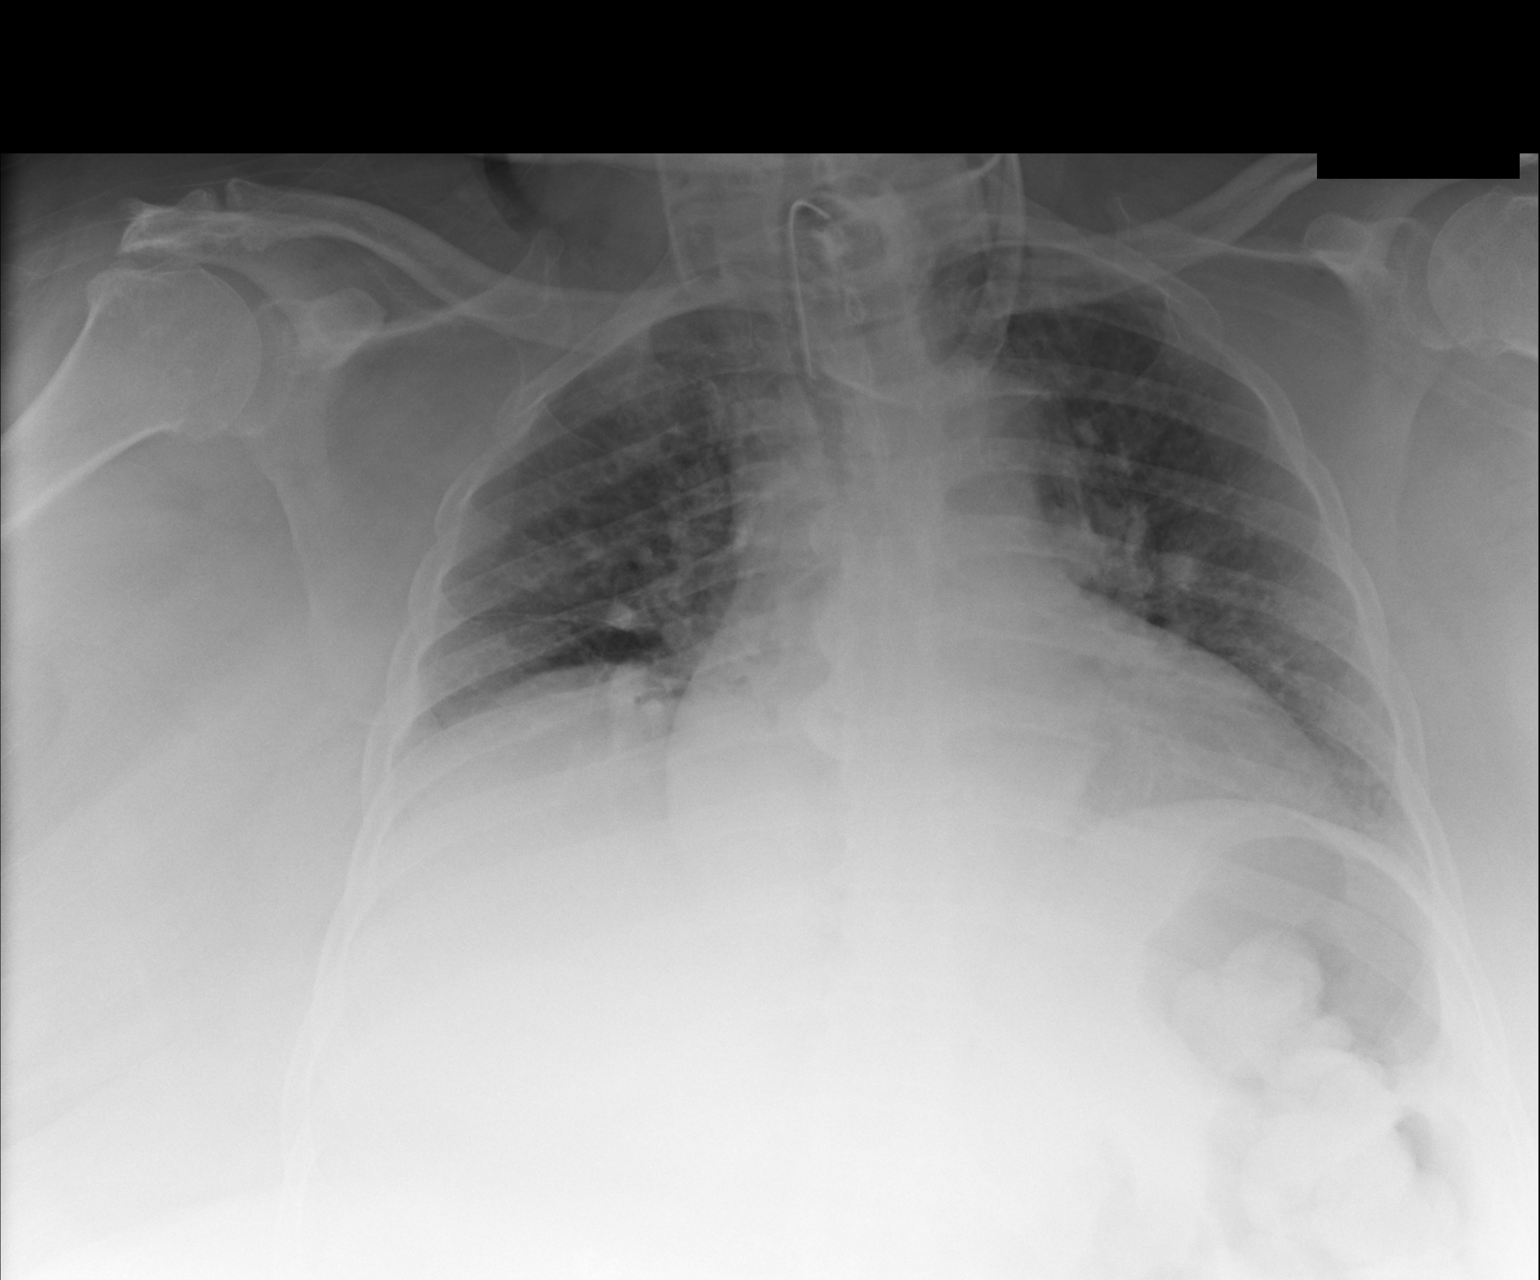

[1 of 1 positions shown; findings below may reference images not displayed]

FINDINGS: Tracheostomy tube remains in place. Shallow inspiration. Heart size
and pulmonary vascularity are prominent, suggesting mild persistent
congestion. No blunting of costophrenic angles. No pneumothorax. No
focal consolidation.
IMPRESSION: Stable appearance of the chest since previous study. Shallow
inspiration with mild cardiac enlargement and pulmonary vascular
congestion.

## 2014-07-13 IMAGING — CR DG CHEST 1V PORT
1 series · 1 of 1 positions shown · non-contrast
Comparison: 07/03/2013.

CLINICAL DATA: Chest pain, cough fever.

EXAM:
PORTABLE CHEST - 1 VIEW

[AP]
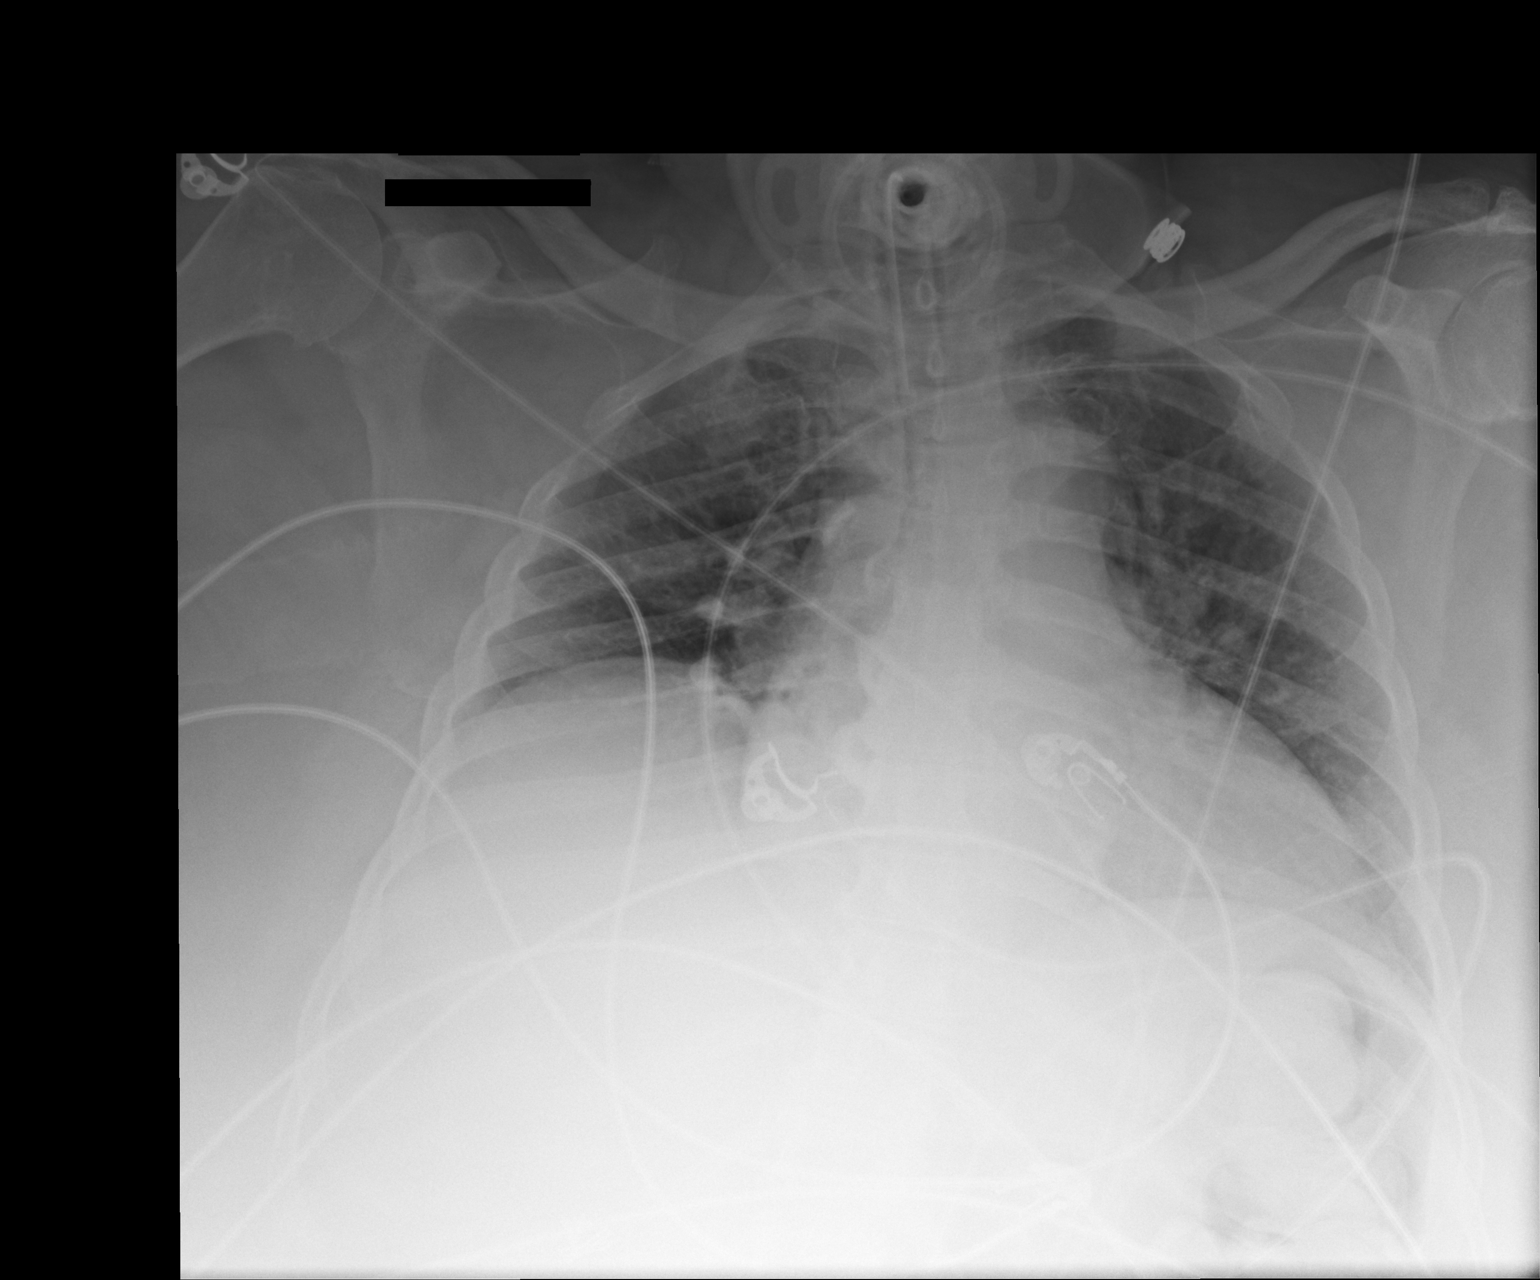

[1 of 1 positions shown; findings below may reference images not displayed]

FINDINGS: The heart is enlarged but stable. The mediastinal and hilar contours
are prominent but unchanged. A tracheostomy tube is stable. Low lung
volumes with vascular crowding and atelectasis. No definite
infiltrates or effusions. Stable eventration of the right
hemidiaphragm.
IMPRESSION: Stable cardiac enlargement.

No acute pulmonary findings.

## 2014-08-26 IMAGING — CR DG CHEST 1V PORT
1 series · 1 of 1 positions shown · non-contrast
Comparison: CT ANGIO CHEST W/CM &/OR WO/CM dated 07/06/2013; DG
CHEST 2 VIEW dated 07/03/2013

CLINICAL DATA: SHORTNESS OF BREATH

EXAM:
PORTABLE CHEST - 1 VIEW

[AP]
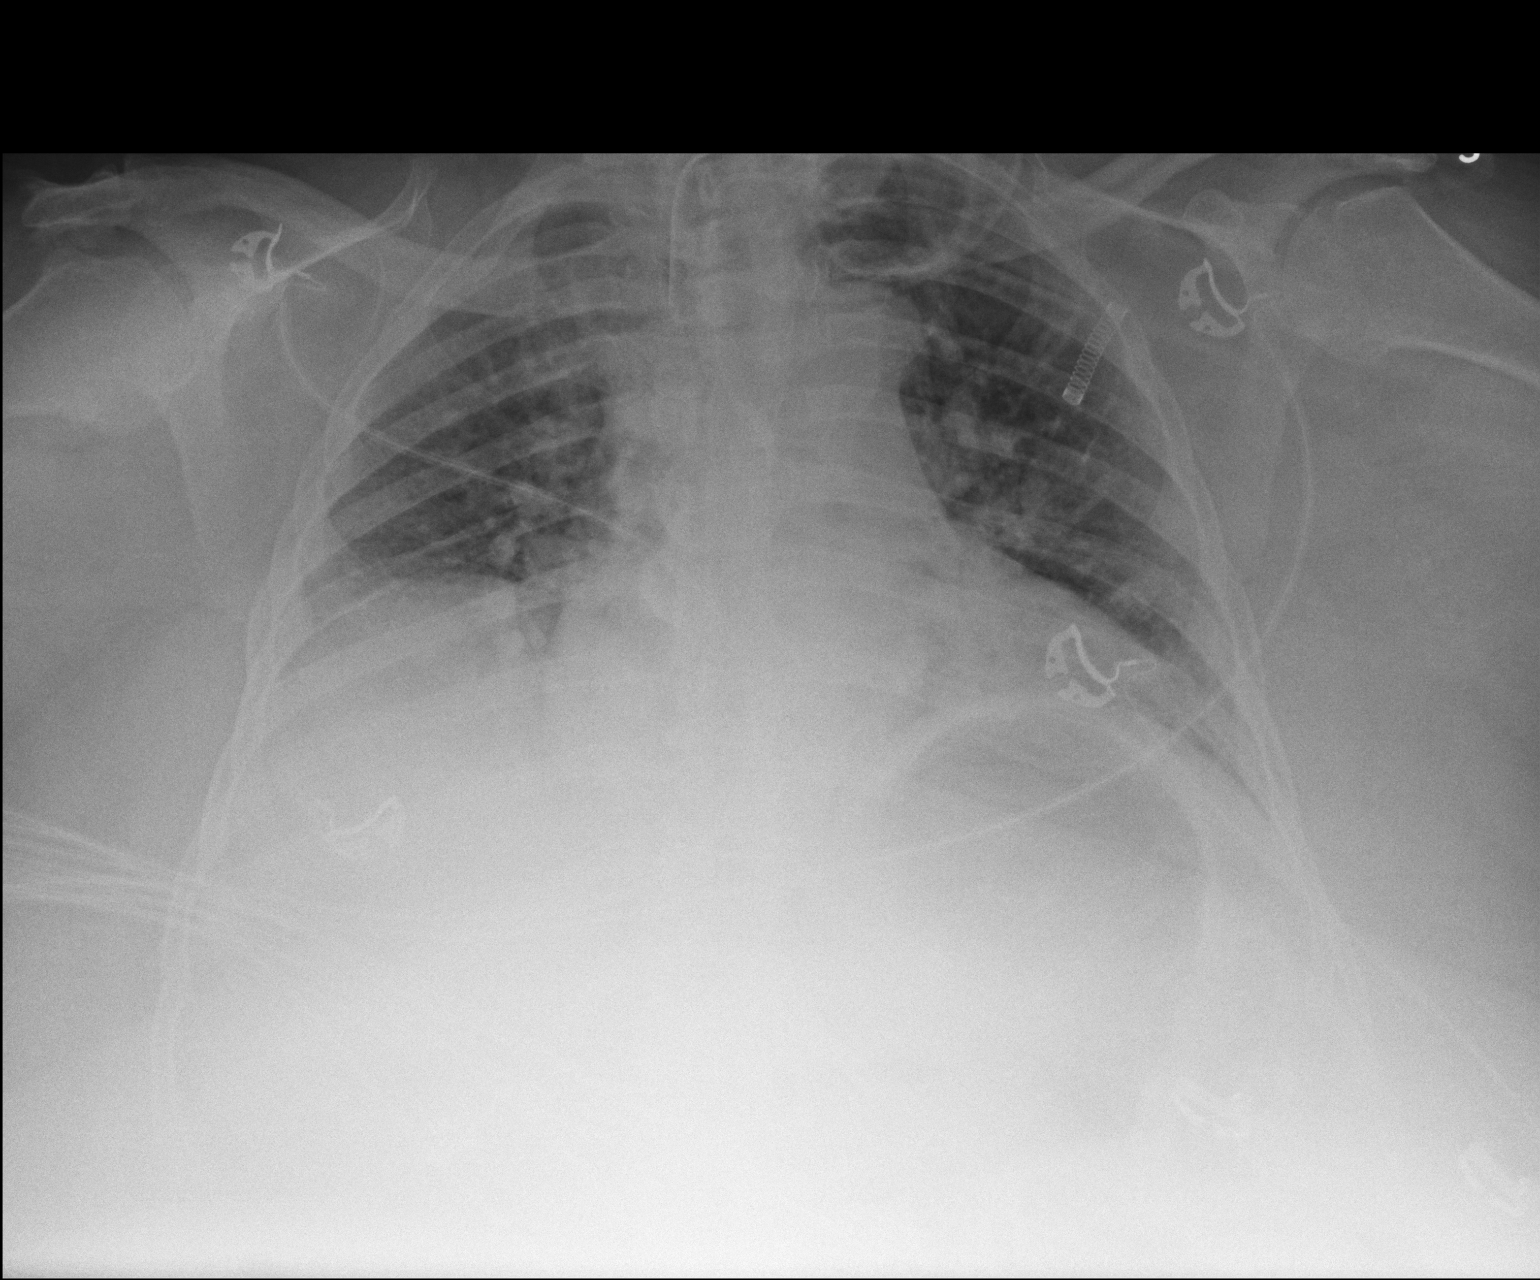

[1 of 1 positions shown; findings below may reference images not displayed]

FINDINGS: Low lung volumes. There is prominence of interstitial markings,
peribronchial cuffing, and indistinctness of the pulmonary
vasculature. An endotracheal tube is identified with tip at the
level of clavicles. Metallic density having the appearance of
spurring projects in the left lung apex likely reflecting an
external device. The osseous structures unremarkable. Cardiac
silhouette is enlarged.
IMPRESSION: Interstitial findings as described above likely reflecting pulmonary
edema. Findings are accentuated by the patient's low lung volumes.
No focal regions of consolidation appreciated.

## 2014-08-27 IMAGING — CR DG ABDOMEN 1V
1 series · 1 of 1 positions shown · non-contrast
Comparison: Abdomen film from 05/04/2013

CLINICAL DATA: To confirm a NG tube placement

EXAM:
ABDOMEN - 1 VIEW

[AP]
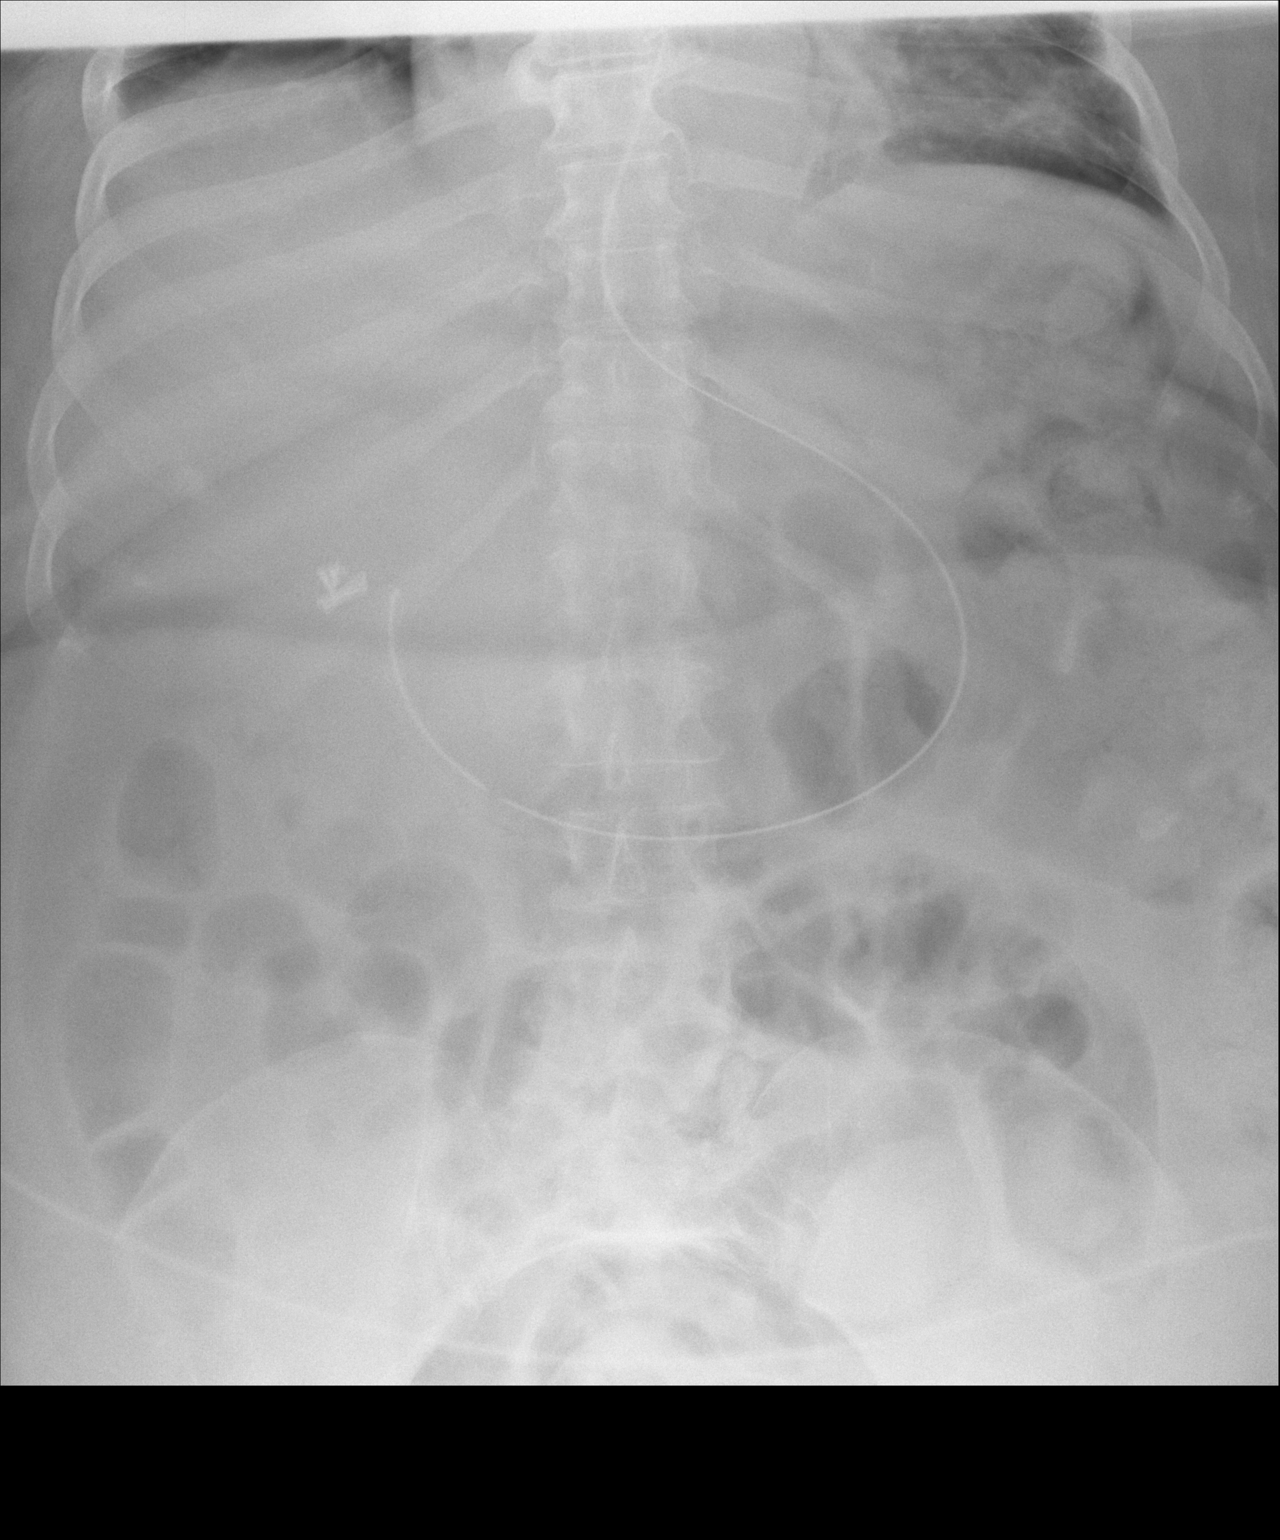

[1 of 1 positions shown; findings below may reference images not displayed]

FINDINGS: The tip of the NG tube is in the region of the distal antrum
-duodenum bulb. The bowel gas pattern is nonspecific. Surgical clips
are present from prior cholecystectomy. Linear opacity at the left
lung base is most consistent with atelectasis.
IMPRESSION: Tip of NG tube near the distal antrum -duodenal bulb region.

## 2014-08-27 IMAGING — DX DG CHEST 1V PORT
1 series · 1 of 1 positions shown · non-contrast
Comparison: DG CHEST 1V PORT dated 08/18/2013;

CLINICAL DATA: Evaluate tracheostomy tube positioning

EXAM:
PORTABLE CHEST - 1 VIEW

[portable]
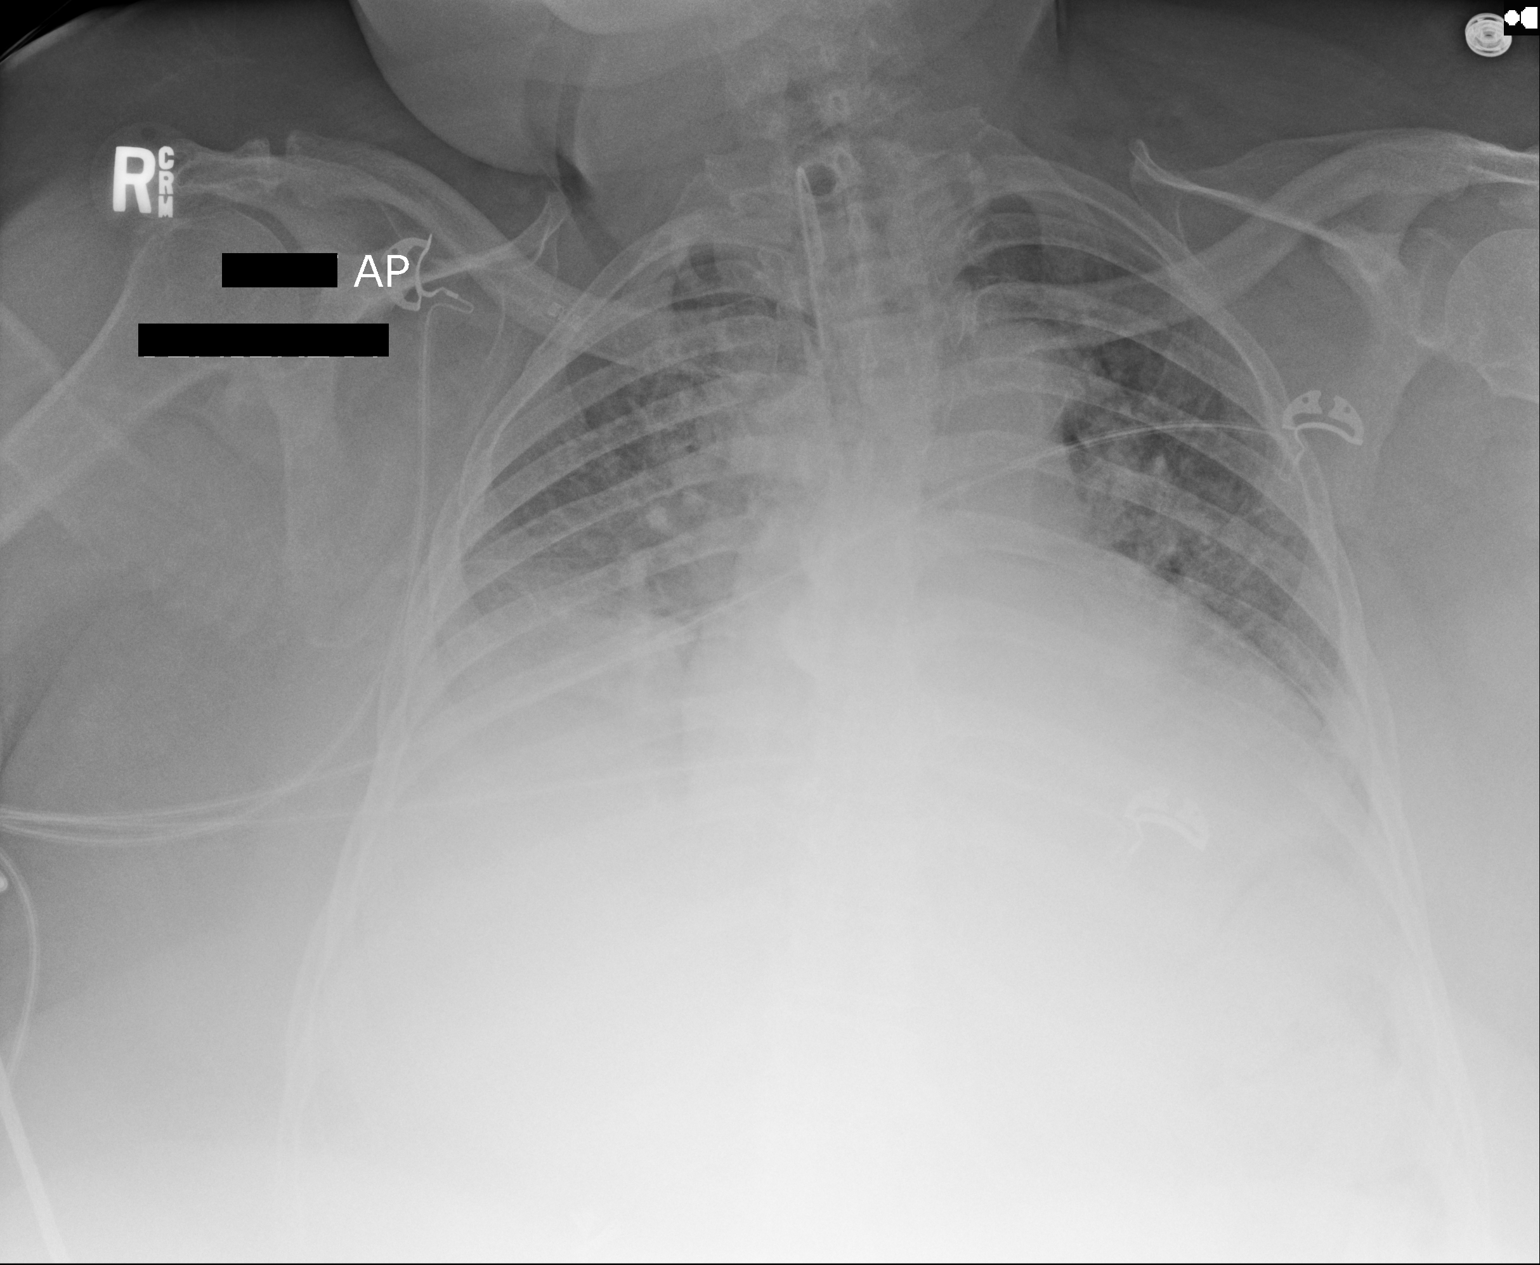

[1 of 1 positions shown; findings below may reference images not displayed]

DG CHEST 1V PORT
dated 07/05/2013; CT ANGIO CHEST W/CM &/OR WO/CM dated 07/06/2013; DG
CHEST 2 VIEW dated 07/03/2013
FINDINGS: Grossly unchanged enlarged cardiac silhouette and mediastinal
contours given persistently reduced lung volumes. Stable positioning
of support apparatus. No definite pneumothorax. Pulmonary
vasculature is indistinct with cephalization of flow. Worsening
perihilar and medial basilar heterogeneous opacities. Suspected
increase in small/trace bilateral pleural effusions. Grossly
unchanged bones.
IMPRESSION: 1. Stable positioning of support apparatus.  No pneumothorax.
2. Persistently reduced lung volumes with findings suggestive of
worsening edema and bilateral atelectasis.
3. Suspected increase in small/trace bilateral effusions.

## 2014-08-28 IMAGING — CR DG CHEST 1V PORT
1 series · 1 of 1 positions shown · non-contrast
Comparison: Yesterday.

CLINICAL DATA: Shortness of breath.  No intubated.

EXAM:
PORTABLE CHEST - 1 VIEW

[AP]
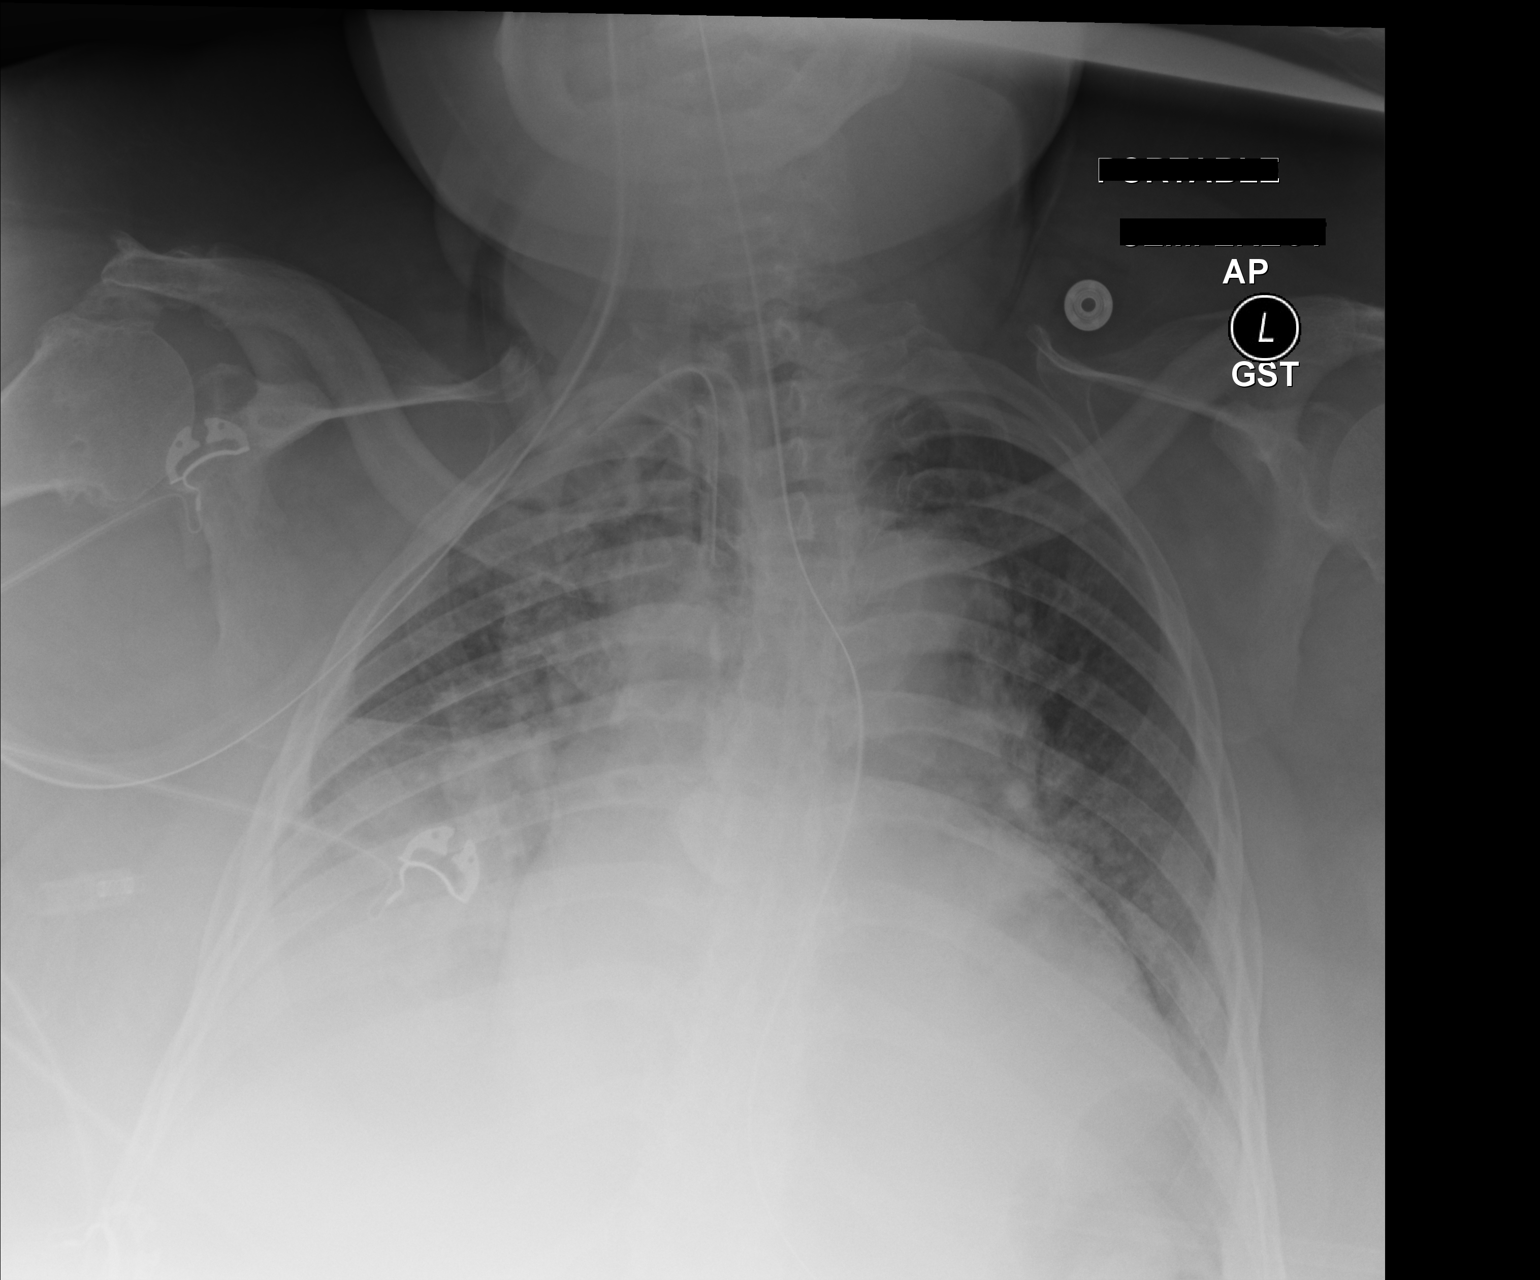

[1 of 1 positions shown; findings below may reference images not displayed]

FINDINGS: Tracheostomy tube in satisfactory position. Nasogastric tube
extending into the stomach. Stable enlarged cardiac silhouette. No
significant change in bilateral airspace opacity. Decreased
prominence of the interstitial markings. Stable prominence of the
pulmonary vasculature. Thoracic spine and right shoulder
degenerative changes.
IMPRESSION: 1. No significant change in bilateral atelectasis or pneumonia.
2. Stable cardiomegaly and pulmonary vascular congestion.
3. Improved interstitial pulmonary edema.

## 2014-08-28 IMAGING — CR DG CHEST 1V PORT
1 series · 1 of 1 positions shown · non-contrast
Comparison: Earlier today.

CLINICAL DATA: Right lower lobe collapse. Status post bronchoscopy.

EXAM:
PORTABLE CHEST - 1 VIEW

[AP]
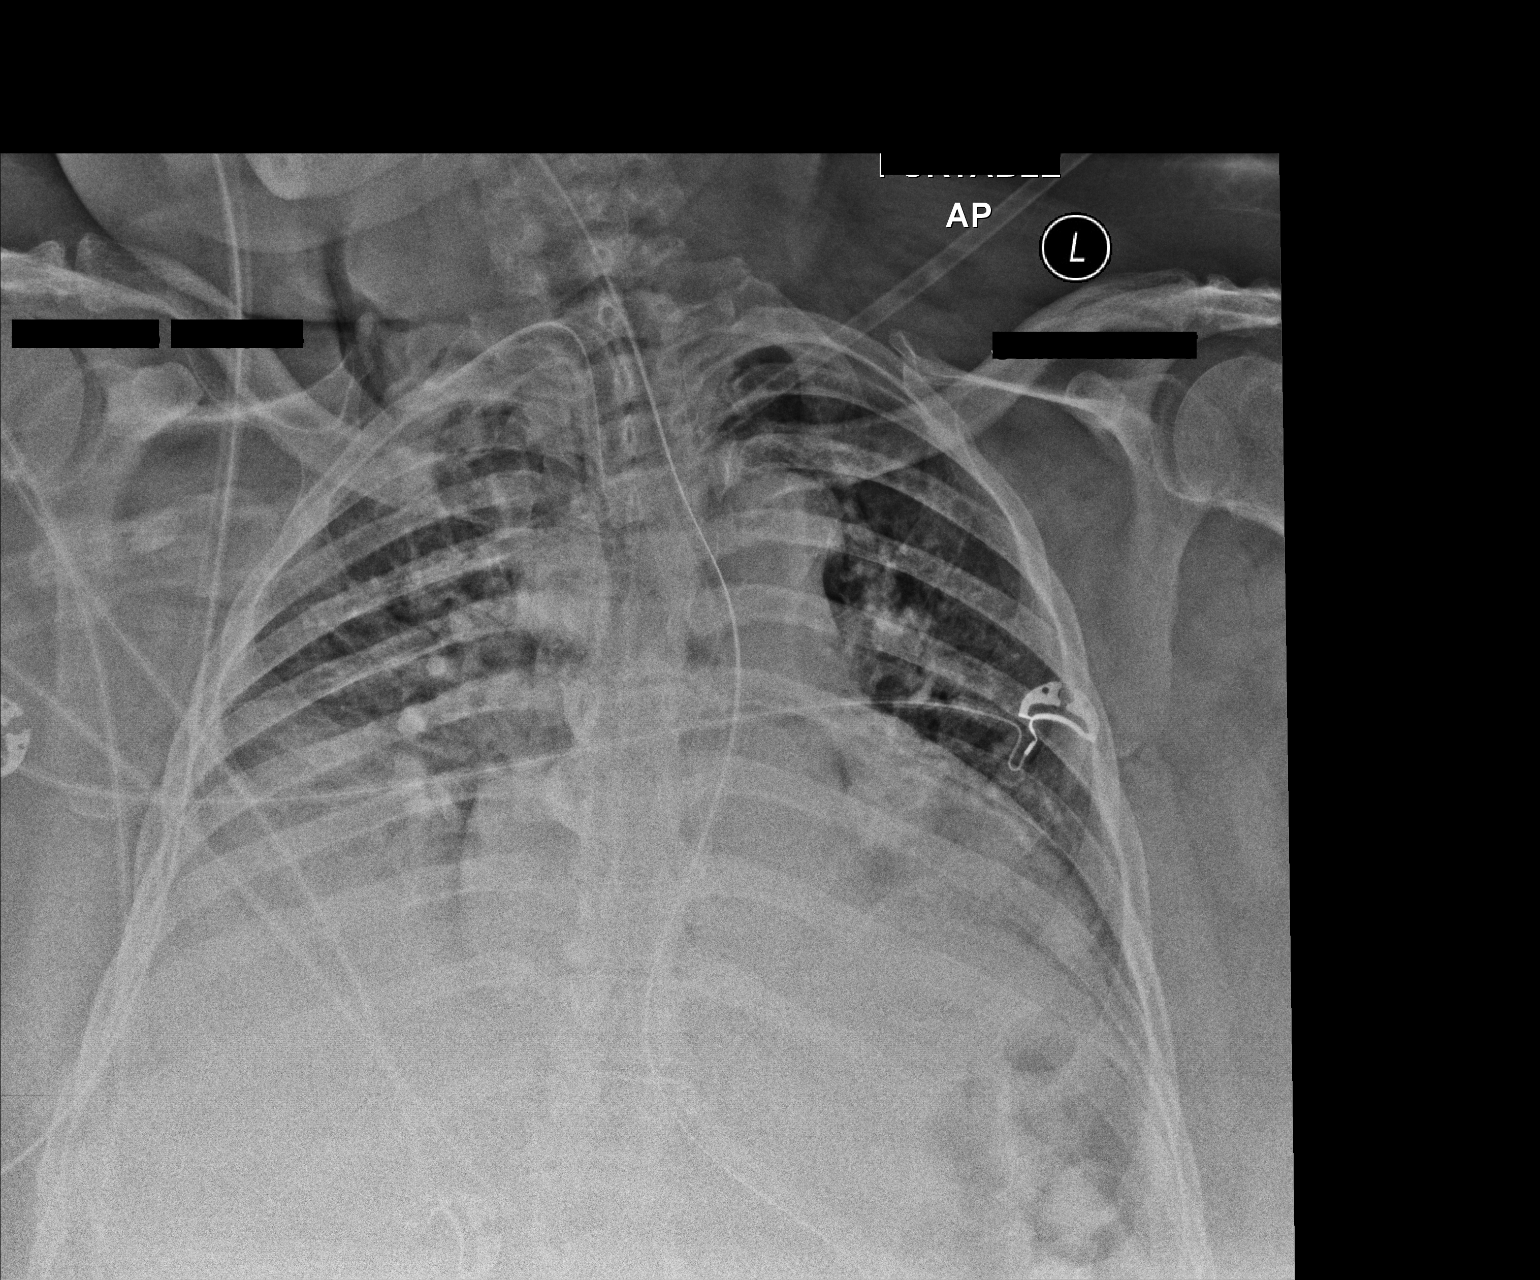

[1 of 1 positions shown; findings below may reference images not displayed]

FINDINGS: The examination is limited by suboptimal technique. Tracheostomy
tube in satisfactory position. No gross change in bilateral airspace
opacity. Stable enlarged cardiac silhouette and prominent pulmonary
vasculature. Nasogastric tube extending into the stomach. Thoracic
spine degenerative changes.
IMPRESSION: Limited examination with no gross change in bilateral atelectasis or
pneumonia, cardiomegaly and pulmonary vascular congestion. No gross
pneumothorax following bronchoscopy.

## 2014-08-30 IMAGING — CR DG CHEST 1V PORT
1 series · 1 of 1 positions shown · non-contrast
Comparison: None.

CLINICAL DATA: Tracheostomy, shortness of breath.

EXAM:
PORTABLE CHEST - 1 VIEW

[AP]
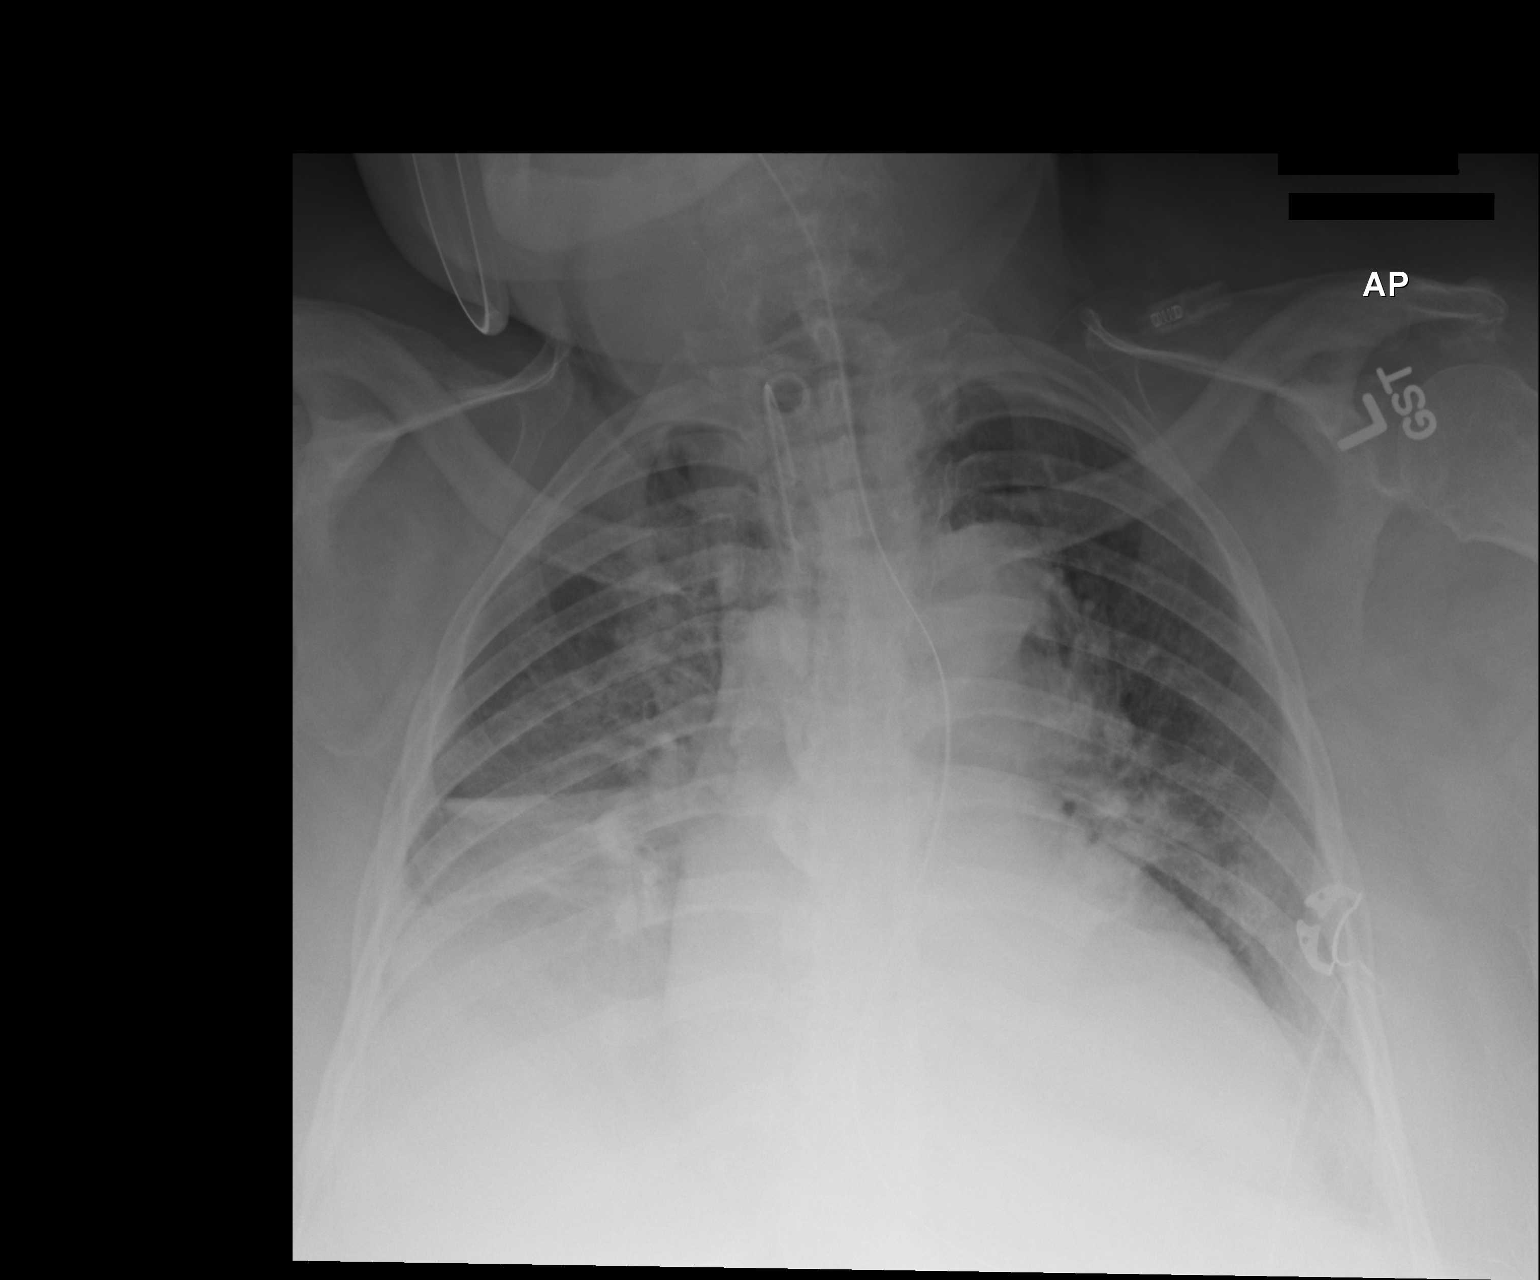

[1 of 1 positions shown; findings below may reference images not displayed]

FINDINGS: Tracheostomy tube is unchanged. NG tube is in the stomach. Diffuse
right lung airspace opacities. Left base atelectasis or infiltrate.
Mild vascular congestion and cardiomegaly. Moderate right pleural
effusion.
IMPRESSION: Bilateral airspace opacities, right greater than left with moderate
right effusion.

Cardiomegaly with vascular congestion.

No significant change.

## 2014-09-15 IMAGING — CR DG CHEST 1V PORT
1 series · 1 of 1 positions shown · non-contrast
Comparison: 08/22/2013

CLINICAL DATA: Chest pain.

EXAM:
PORTABLE CHEST - 1 VIEW

[AP]
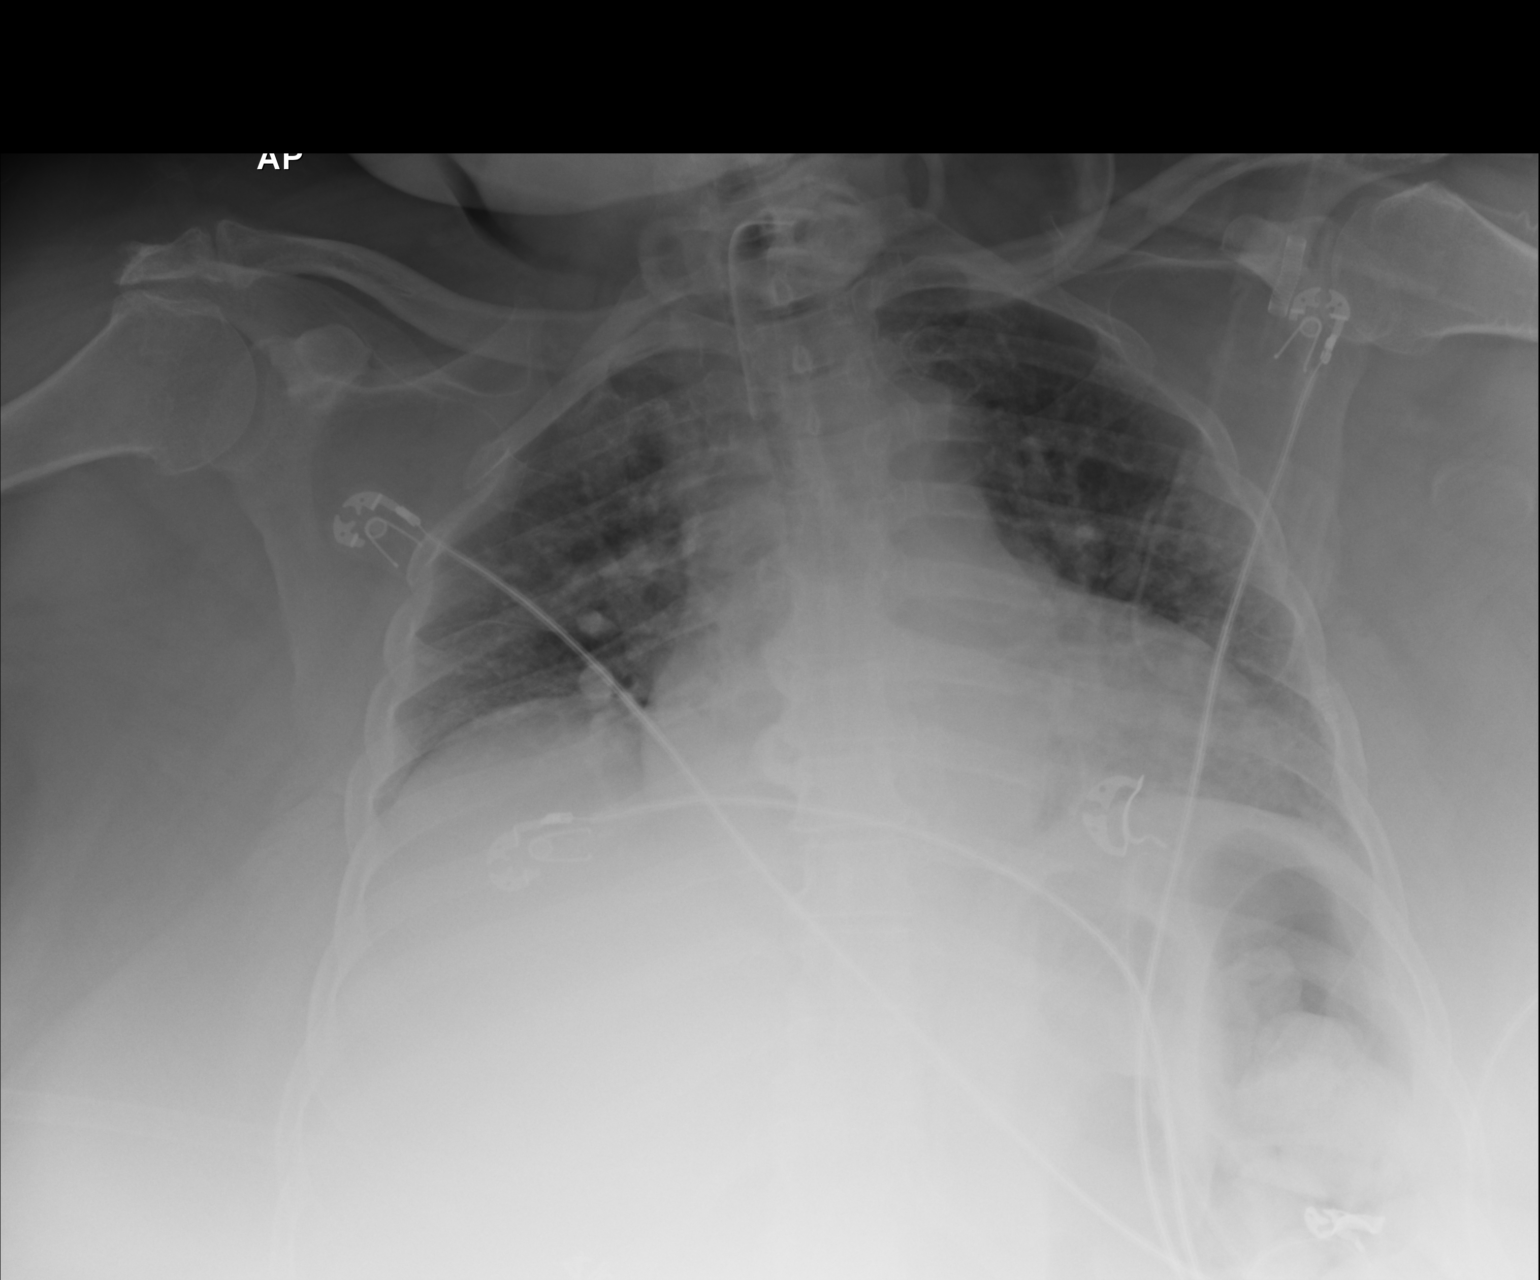

[1 of 1 positions shown; findings below may reference images not displayed]

FINDINGS: Tracheostomy tube is 4.2 cm above the carina. Low lung volumes.
Prominent lung markings and prominent heart size are likely related
to the low lung volumes. No focal airspace disease.
IMPRESSION: Low lung volumes without focal disease.

## 2014-09-15 IMAGING — CT CT ANGIO CHEST
2 of 10 series · 19 of 46 positions shown · IV contrast (agent unspecified)
Comparison: None.

CLINICAL DATA: Chest pain and shortness of breath

EXAM:
CT ANGIOGRAPHY CHEST WITH CONTRAST
TECHNIQUE: Multidetector CT imaging of the chest was performed using the
standard protocol during bolus administration of intravenous
contrast. Multiplanar CT image reconstructions and MIPs were
obtained to evaluate the vascular anatomy.
CONTRAST:  07/06/2013

[Series 6: thins · axial · 0.64mm/px · z∈[+1225,+1403]mm · 16 of 202 slices shown]
[im 12/202  lung]
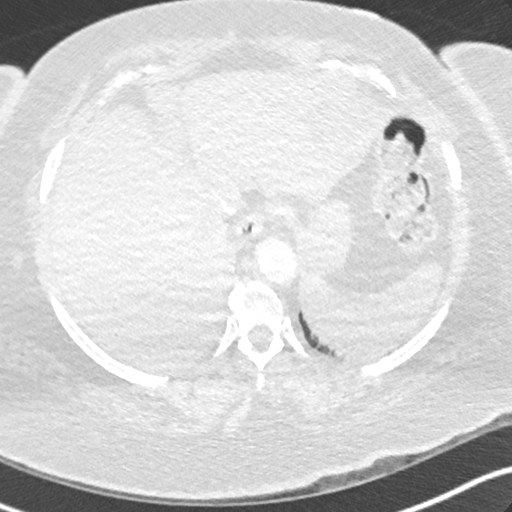
[im 24/202  soft-tissue]
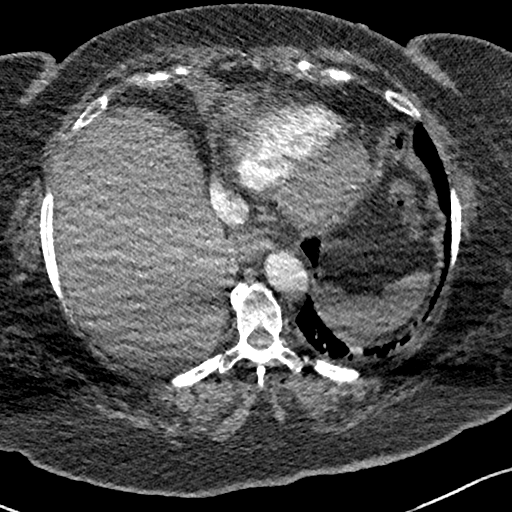
[im 36/202  lung]
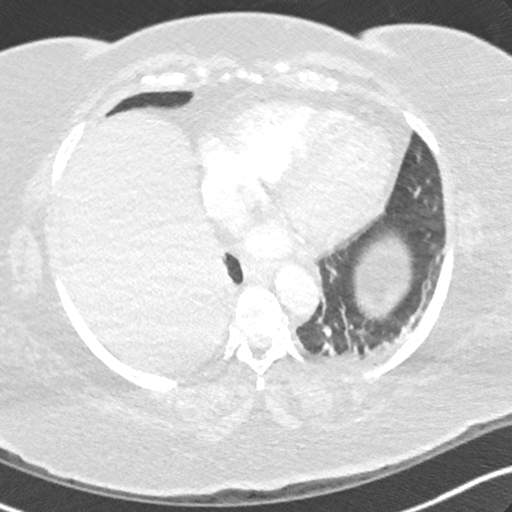
[im 48/202  soft-tissue]
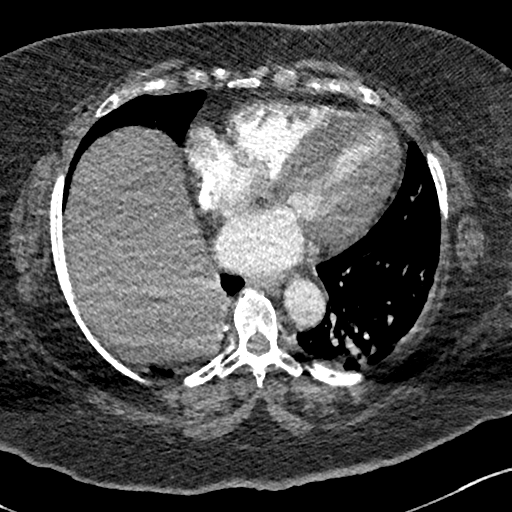
[im 60/202  lung]
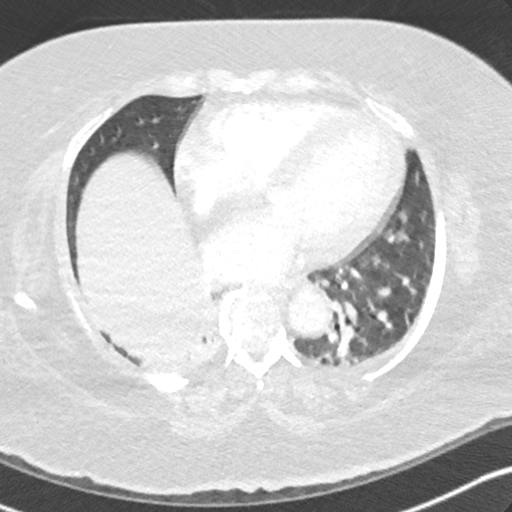
[im 71/202  soft-tissue]
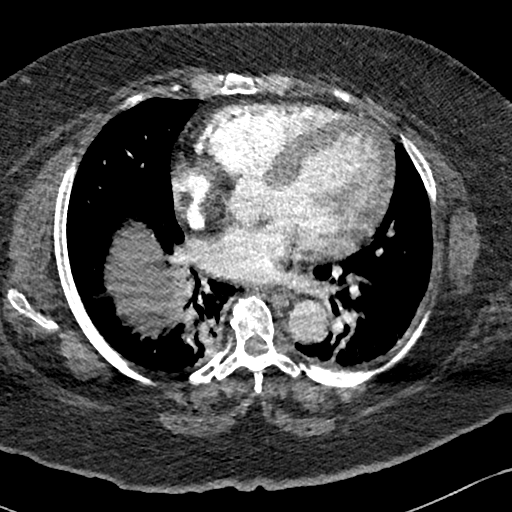
[im 83/202  lung]
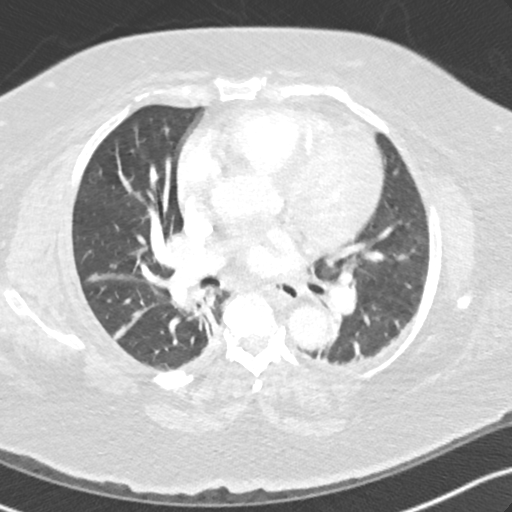
[im 95/202  soft-tissue]
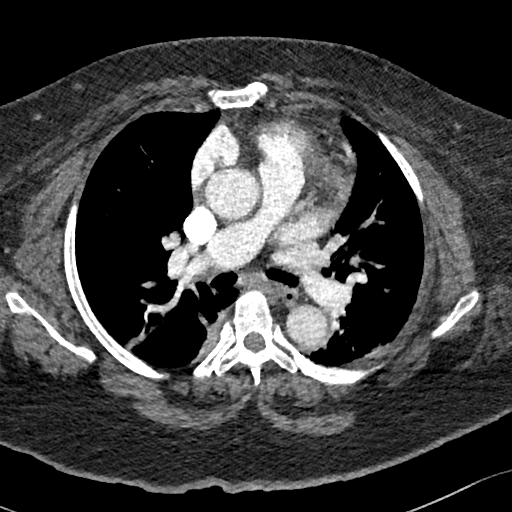
[im 107/202  lung]
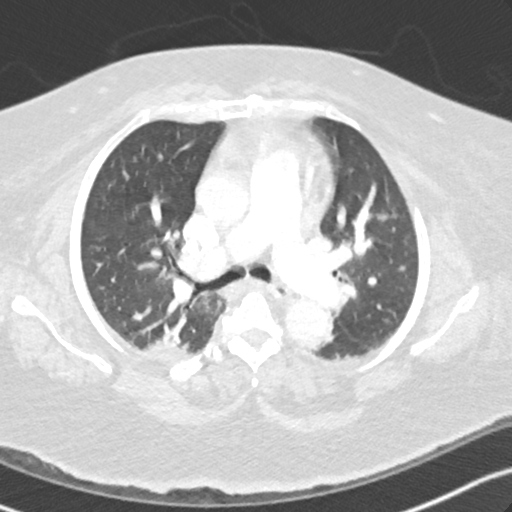
[im 119/202  soft-tissue]
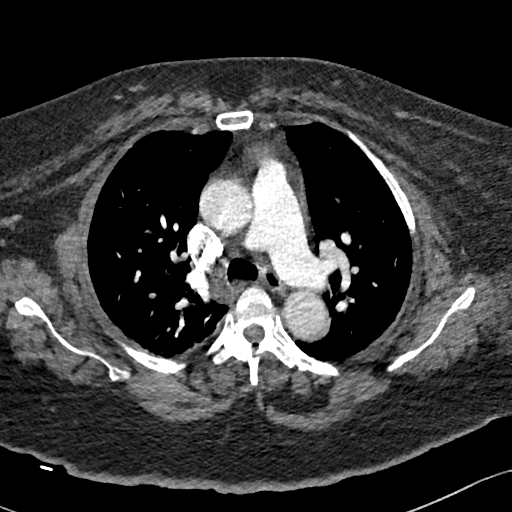
[im 131/202  lung]
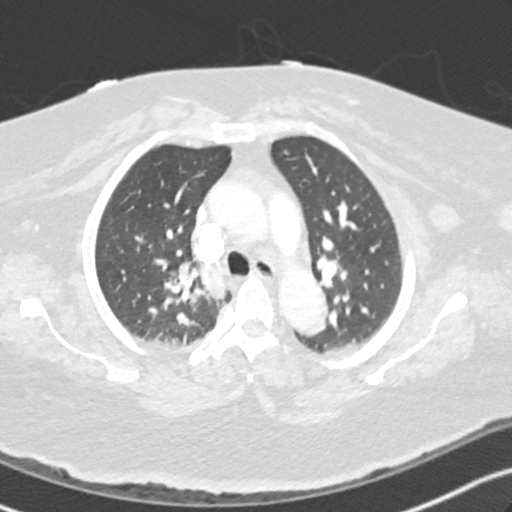
[im 142/202  soft-tissue]
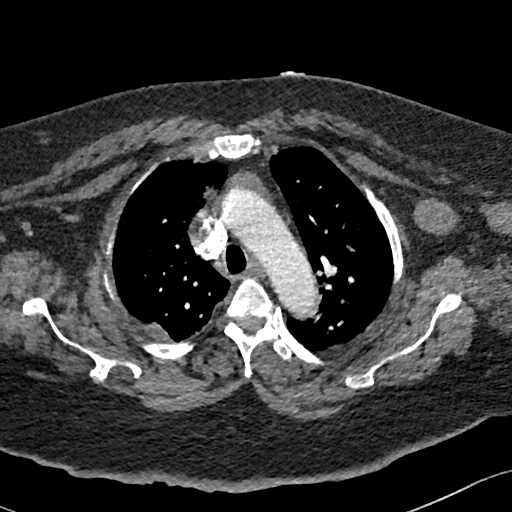
[im 154/202  lung]
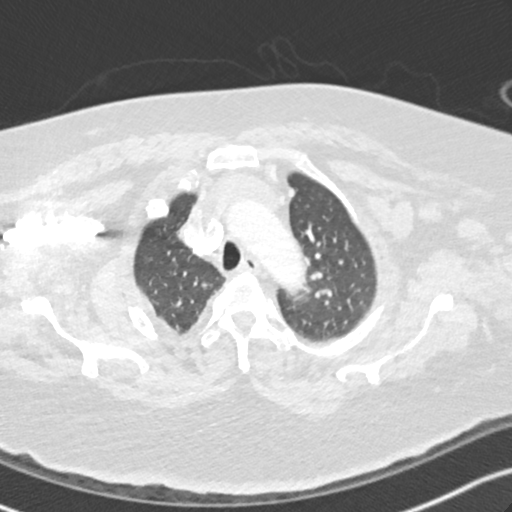
[im 166/202  soft-tissue]
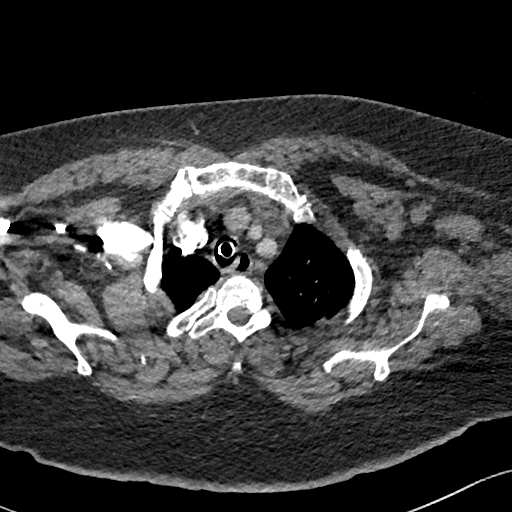
[im 178/202  lung]
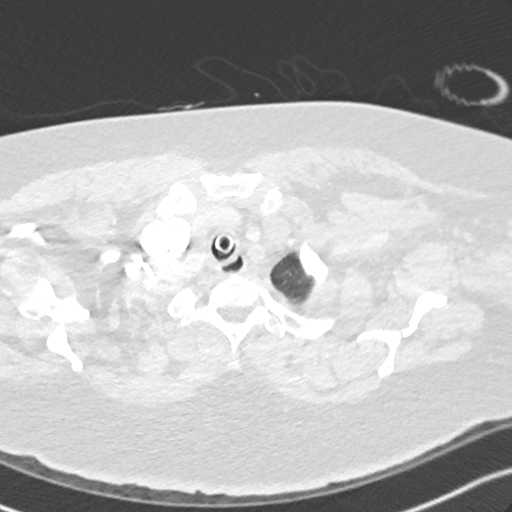
[im 190/202  soft-tissue]
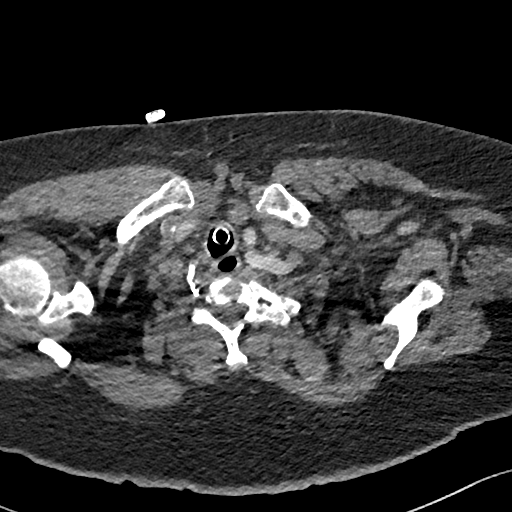

[Series 8: coronal mpr · coronal · 0.59mm/px · 3 of 117 slices shown]
[im 30/117  soft-tissue]
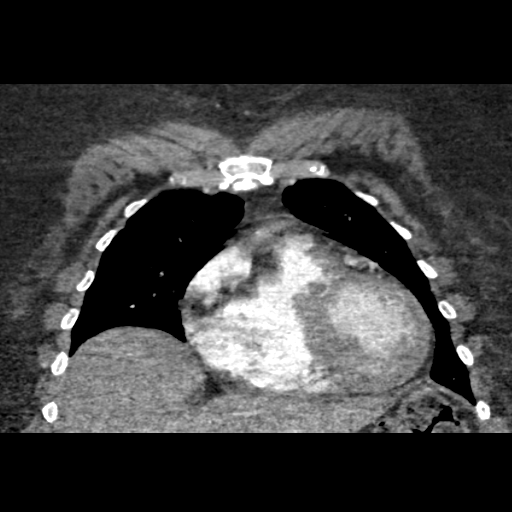
[im 59/117  soft-tissue]
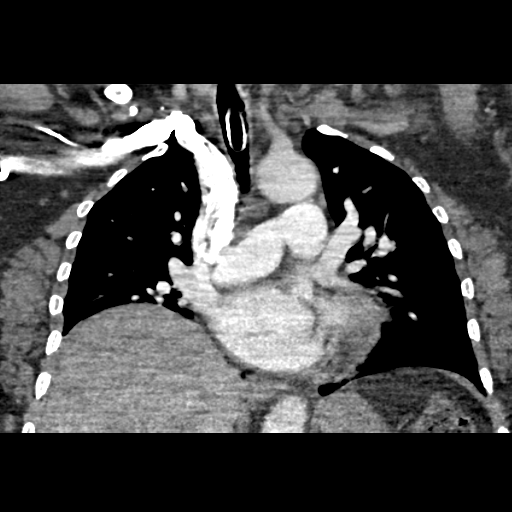
[im 88/117  soft-tissue]
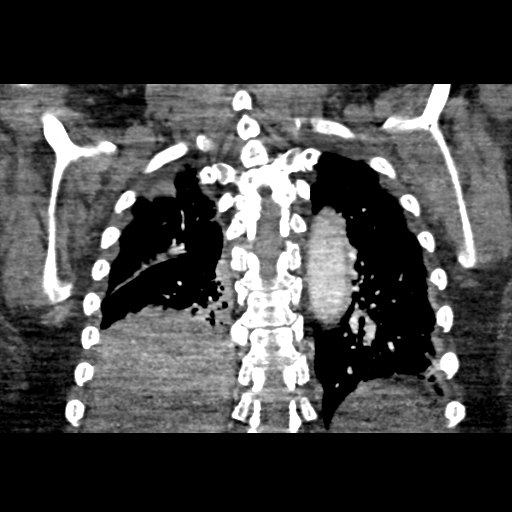

[19 of 46 positions shown; findings below may reference images not displayed]

FINDINGS: THORACIC INLET/BODY WALL:

Visible portions of a tracheostomy are normally seated. There is
persisting left axillary in deep pectoral lymphadenopathy. The nodes
appear stable in size, with an axillary node measuring 28 x 16 mm

MEDIASTINUM:

Cardiomegaly. No pericardial effusion. No mediastinal adenopathy.
Negative for aortic dissection or aneurysm. There is no definitive
pulmonary embolism. Evaluation in the right upper lobe is difficult
due to unpredictable streak artifact from dense contrast within the
SVC. The areas of pulmonary arterial decrease in attenuation do not
have the typical shape for an acute thrombus within the center of
the vessel. Additionally, the neighboring pulmonary veins have
similar alternating density.

LUNG WINDOWS:

Bibasilar atelectasis, worse on the right secondary to chronically
elevated diaphragm. No evidence of edema or consolidation. No
effusion or pneumothorax.

UPPER ABDOMEN:

No acute findings.

OSSEOUS:

No acute fracture.  No suspicious lytic or blastic lesions.

Review of the MIP images confirms the above findings.
IMPRESSION: 1. No definitive pulmonary embolism.
2. Chronic hypoaeration with bibasilar atelectasis.
3. Chronic left axillary and deep pectoral adenopathy without
interval change.

## 2014-09-17 IMAGING — CR DG CHEST 1V PORT
2 series · 2 of 2 positions shown · non-contrast
Comparison: 09/07/2013

CLINICAL DATA: Respiratory failure.

EXAM:
PORTABLE CHEST - 1 VIEW

[AP (1 of 2)]
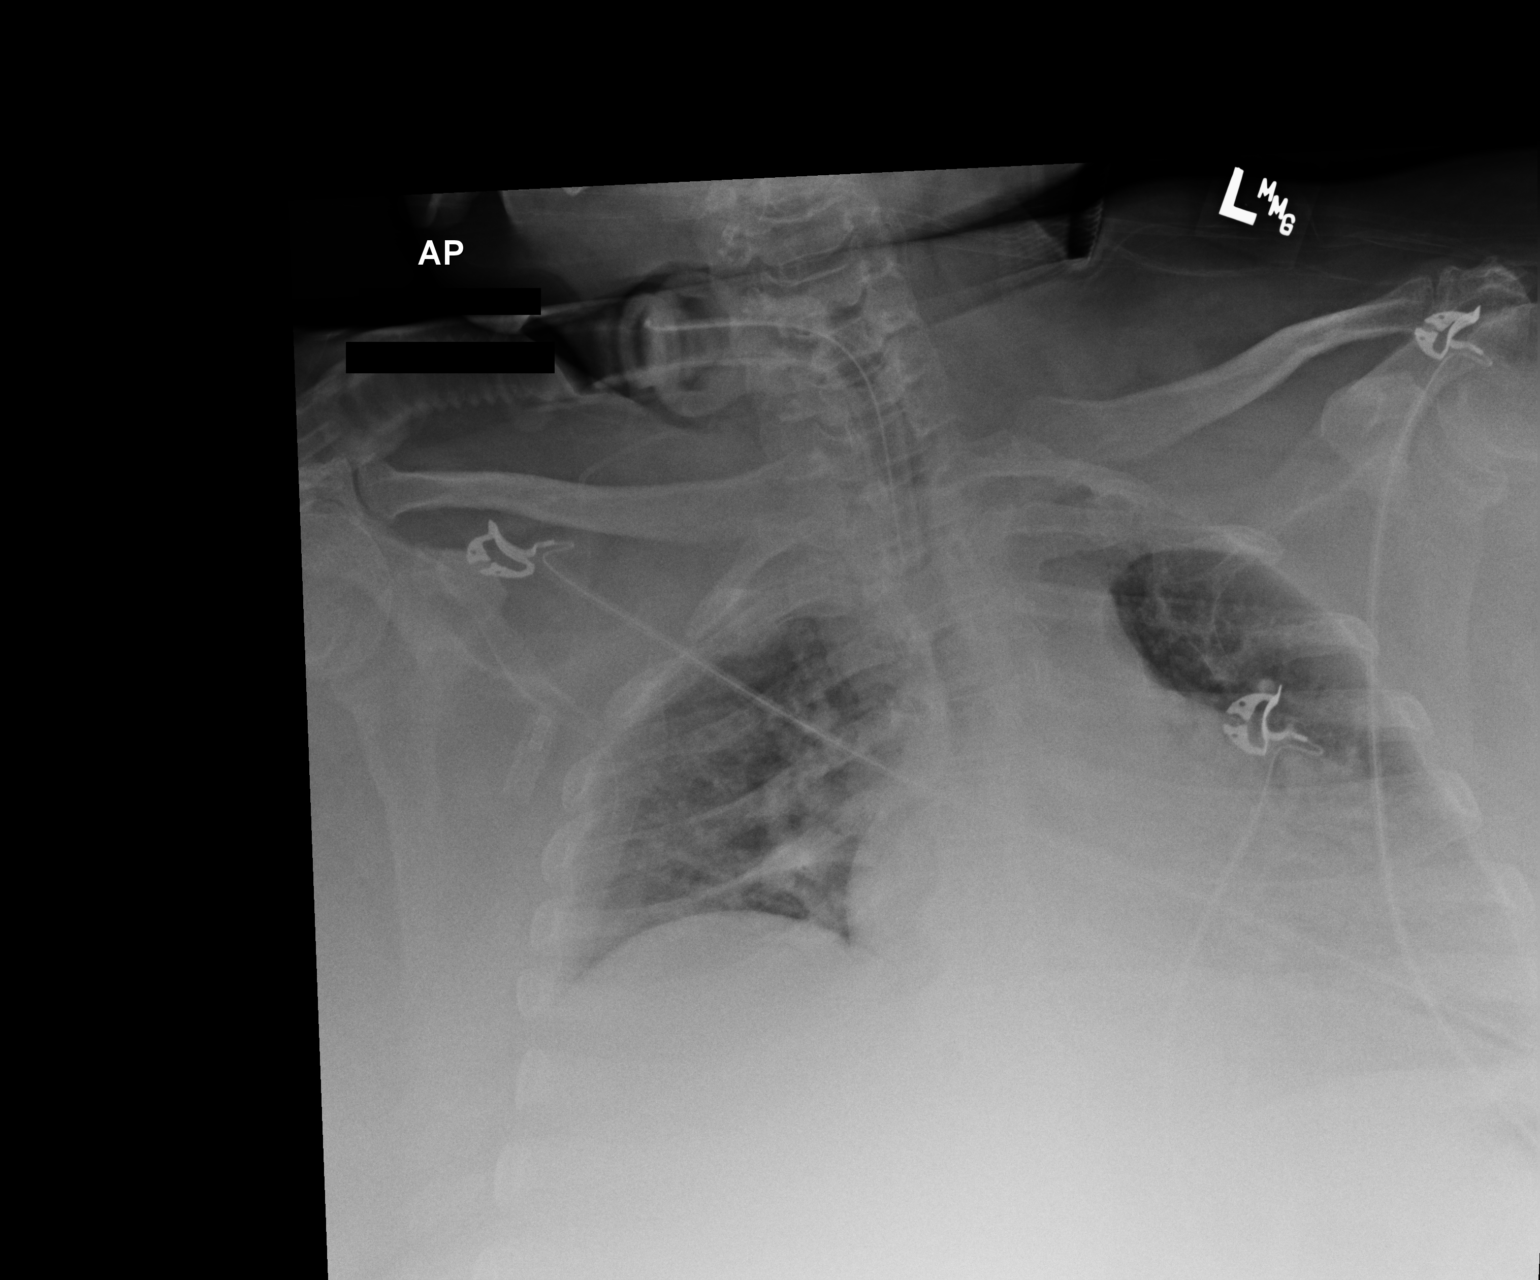

[AP (2 of 2)]
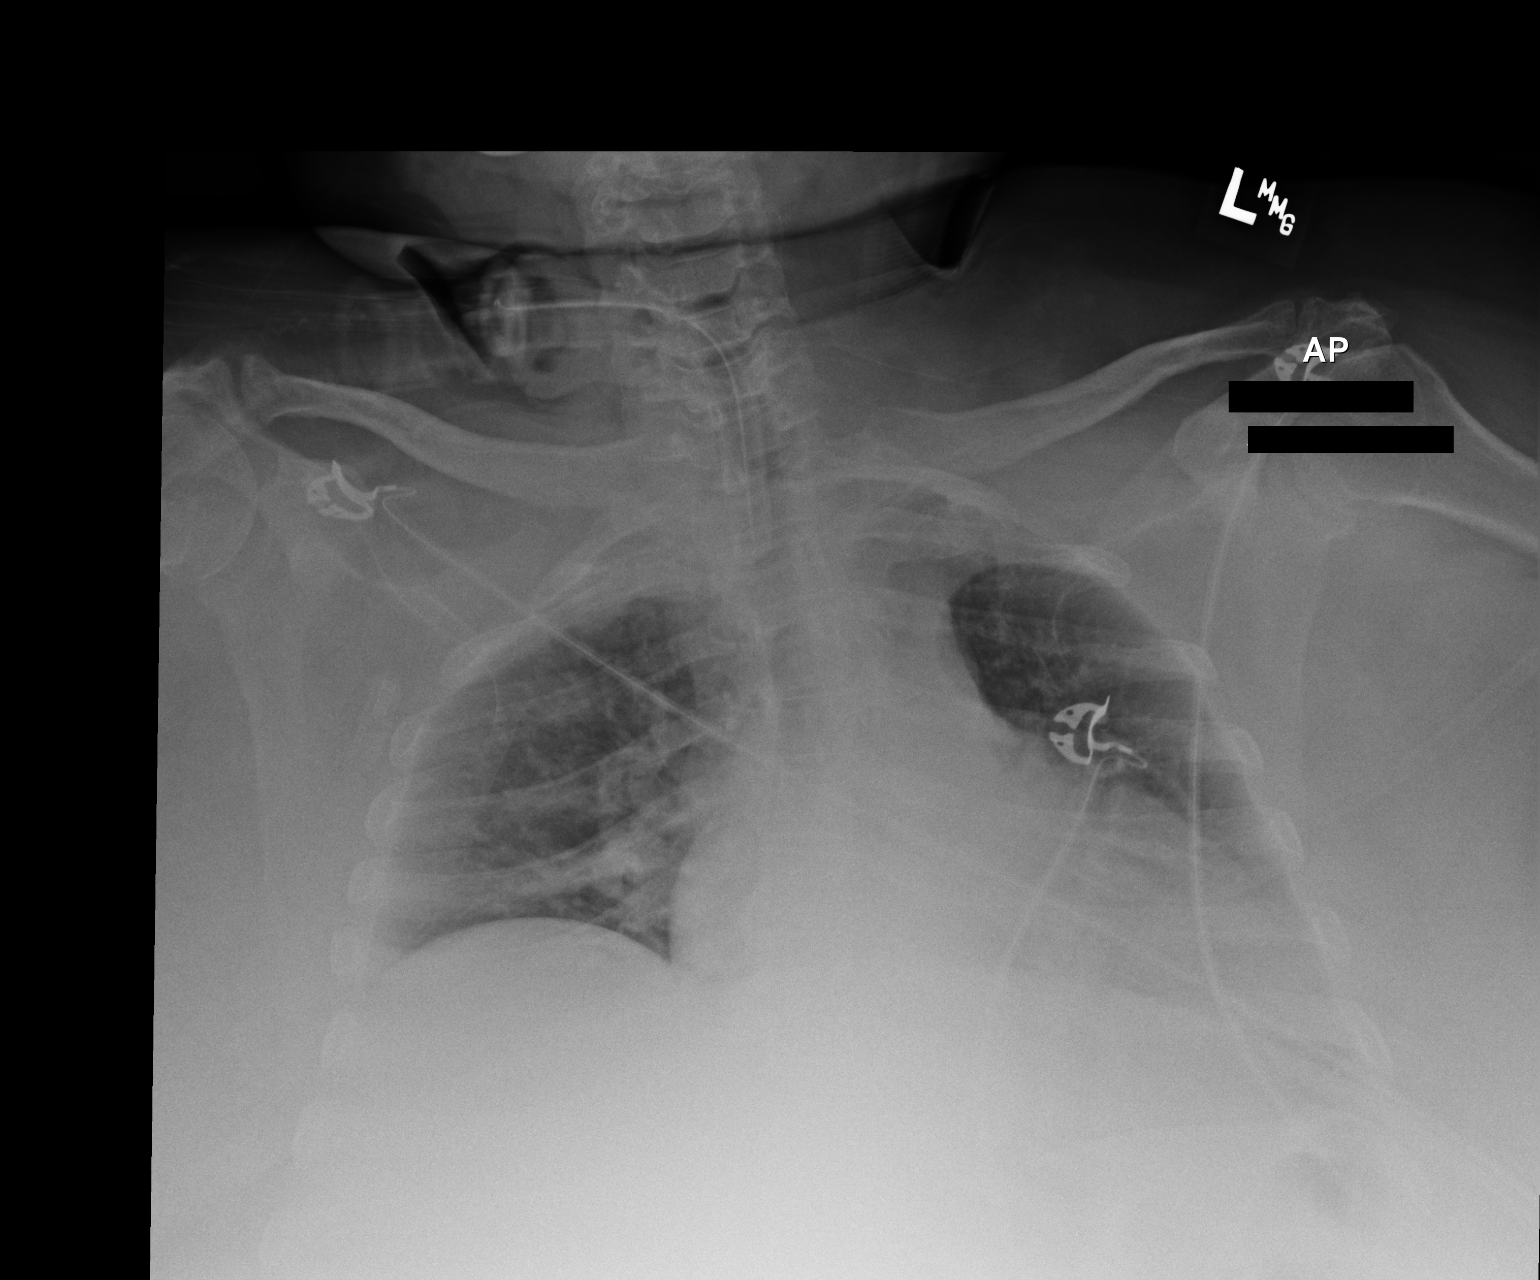

[2 of 2 positions shown; findings below may reference images not displayed]

FINDINGS: Tracheostomy tube in adequate position. Lungs are moderately
hypoinflated with mild stable elevation of the right hemidiaphragm.
No definite consolidation or effusion. Mild stable prominence of the
cardiac silhouette. Remainder of the exam is unchanged.
IMPRESSION: Moderate hypoinflation without acute cardiopulmonary disease.

Stable mild cardiomegaly.

## 2014-09-25 IMAGING — CR DG CHEST 1V PORT
1 series · 1 of 1 positions shown · non-contrast
Comparison: DG CHEST 1V PORT dated 09/09/2013; CT ANGIO CHEST W/CM
&/OR WO/CM dated 09/07/2013

CLINICAL DATA: Hypoxia.  Tracheostomy.

EXAM:
PORTABLE CHEST - 1 VIEW

[AP]
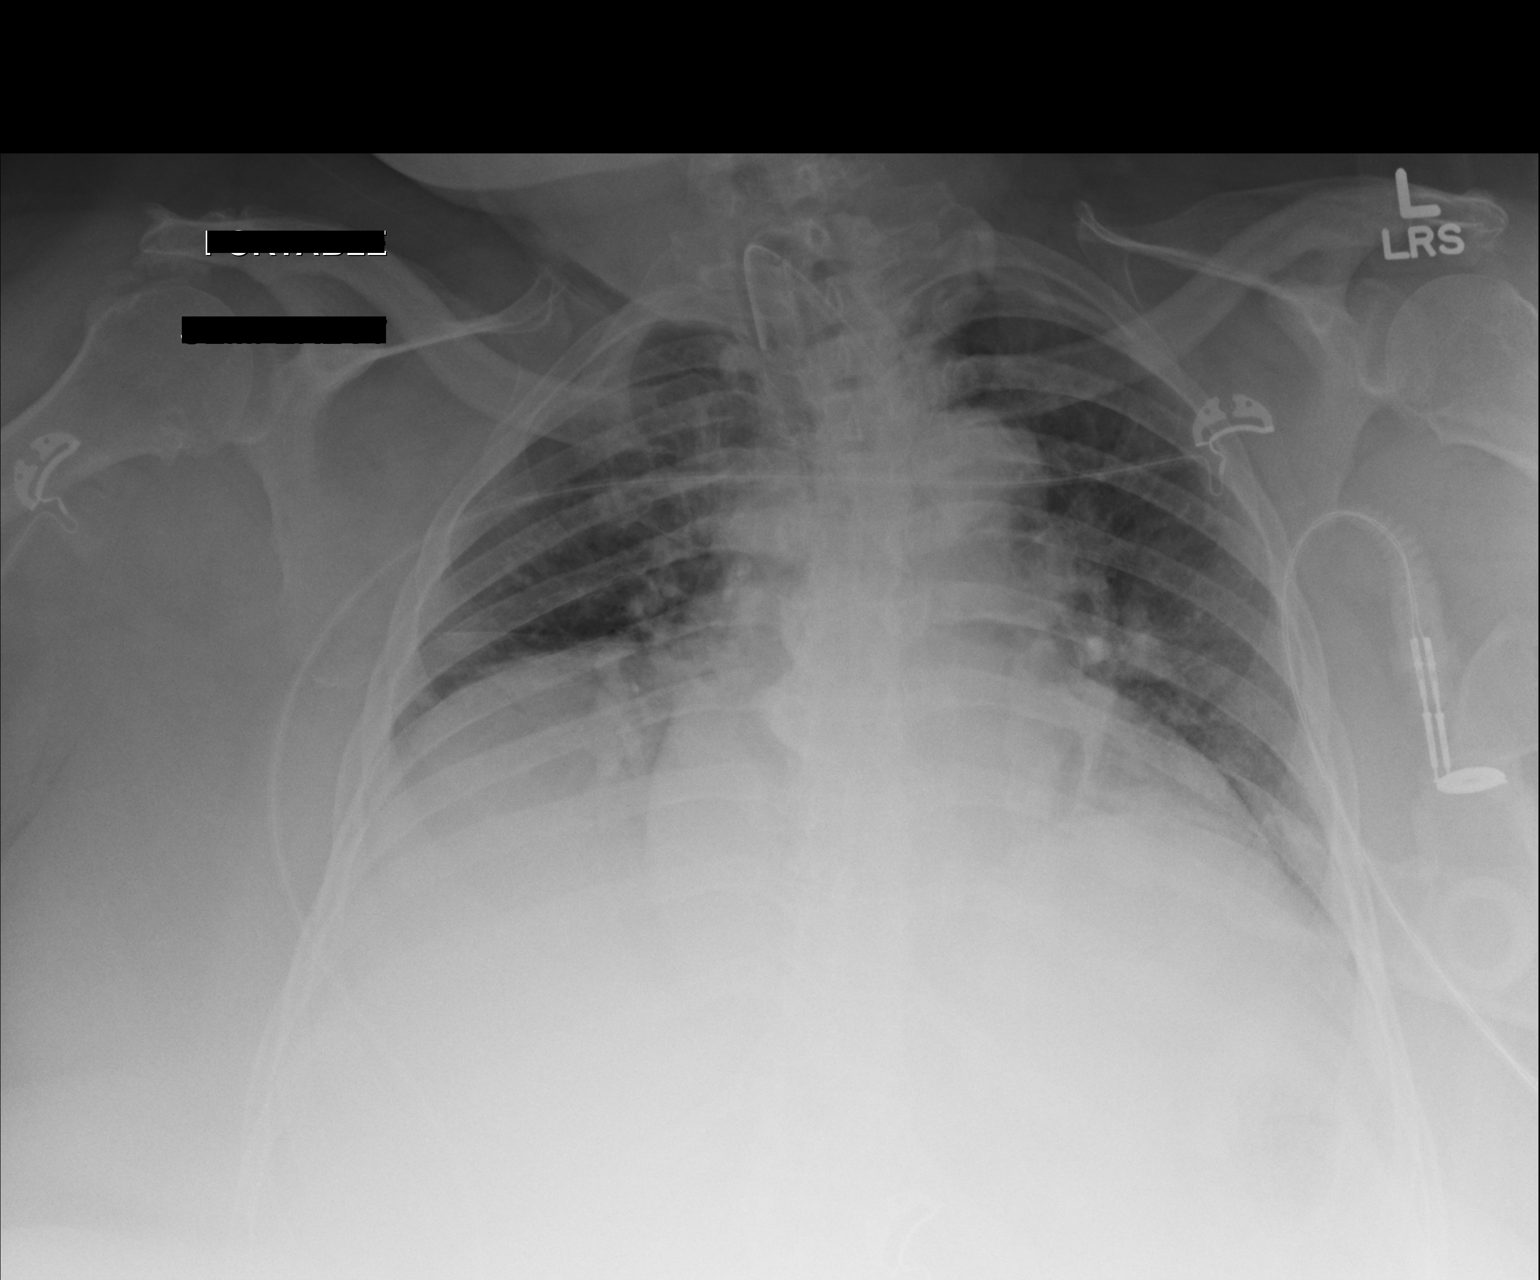

[1 of 1 positions shown; findings below may reference images not displayed]

FINDINGS: Low lung volumes and enlarged cardiopericardial silhouette persist.

Tracheostomy tube projects over the tracheal air shadow, tip 5 cm
above the carina.

Thoracic spondylosis. Borderline elevated right hemidiaphragm.
Suspected atelectasis in the left lower lobe and potentially at the
right lung base. The patient is rotated to the Right on today's
radiograph, reducing diagnostic sensitivity and specificity.
IMPRESSION: 1. Very low lung volumes.
2. Mild cardiomegaly persists.
3. Mild atelectasis in the lung bases.

## 2014-10-01 IMAGING — DX DG CHEST 1V PORT
1 series · 1 of 1 positions shown · non-contrast
Comparison: Prior chest x-ray 09/17/2013

CLINICAL DATA: Short of breath, infiltrates

EXAM:
PORTABLE CHEST - 1 VIEW

[portable]
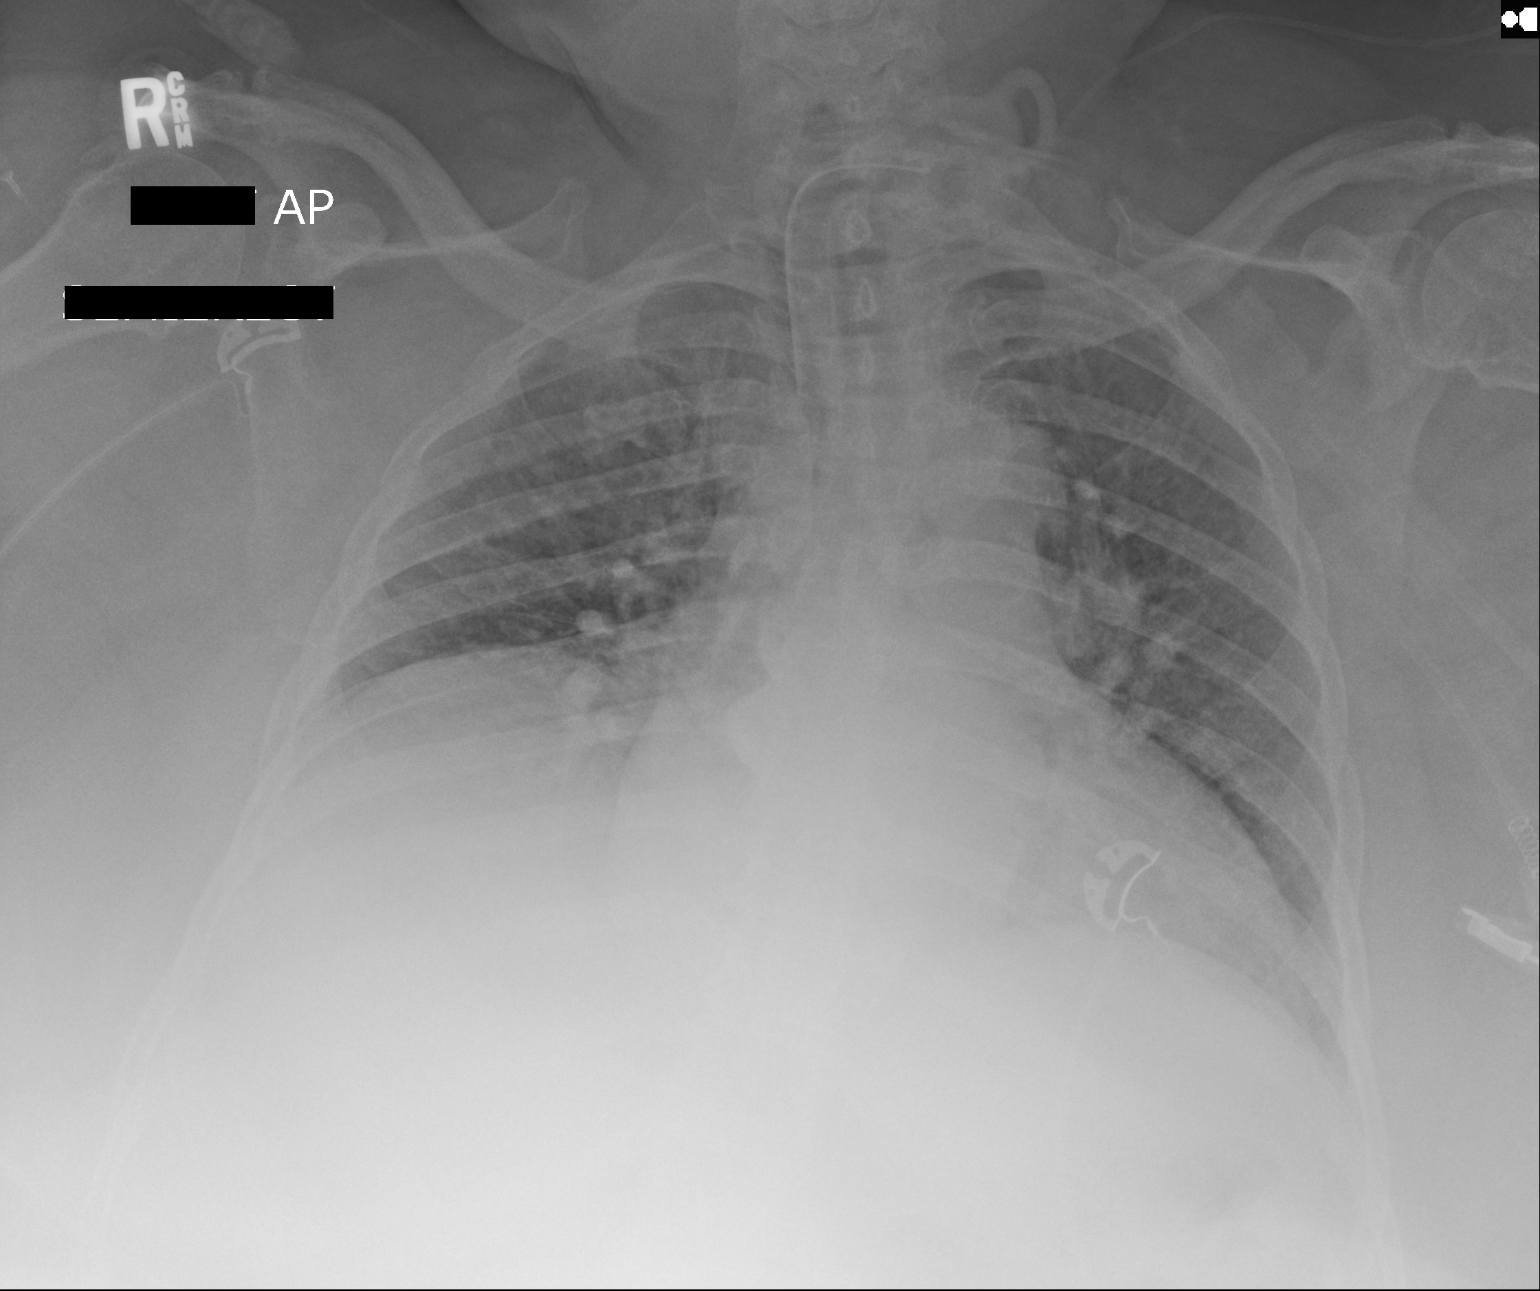

[1 of 1 positions shown; findings below may reference images not displayed]

FINDINGS: Stable cardiomegaly. Inspiratory volumes remain a very low. The
tracheostomy tube tip is midline at the level of the clavicles in
satisfactory position. Slightly increased pulmonary vascular
congestion. No overt edema. Elevation of the right hemidiaphragm is
similar compared to prior. No pneumothorax. No acute osseous
abnormality.
IMPRESSION: 1. Slightly increased pulmonary vascular congestion without evidence
of overt edema.
2. Inspiratory volumes remain very low and there is persistent
elevation of the right hemidiaphragm.
3. Stable cardiomegaly.
